# Patient Record
Sex: Female | Born: 1965 | Race: White | Hispanic: No | Smoking: Never smoker
Health system: Southern US, Community
[De-identification: ages and names within clinical notes are randomized; demographics above are authoritative.]

## PROBLEM LIST (undated history)

## (undated) DIAGNOSIS — G5700 Lesion of sciatic nerve, unspecified lower limb: Secondary | ICD-10-CM

## (undated) DIAGNOSIS — Z9884 Bariatric surgery status: Secondary | ICD-10-CM

## (undated) DIAGNOSIS — R569 Unspecified convulsions: Secondary | ICD-10-CM

## (undated) DIAGNOSIS — I951 Orthostatic hypotension: Secondary | ICD-10-CM

## (undated) DIAGNOSIS — D649 Anemia, unspecified: Secondary | ICD-10-CM

## (undated) DIAGNOSIS — M810 Age-related osteoporosis without current pathological fracture: Secondary | ICD-10-CM

## (undated) DIAGNOSIS — E079 Disorder of thyroid, unspecified: Secondary | ICD-10-CM

## (undated) DIAGNOSIS — E162 Hypoglycemia, unspecified: Secondary | ICD-10-CM

## (undated) DIAGNOSIS — I219 Acute myocardial infarction, unspecified: Secondary | ICD-10-CM

## (undated) DIAGNOSIS — Z8679 Personal history of other diseases of the circulatory system: Secondary | ICD-10-CM

## (undated) HISTORY — PX: GASTRIC BYPASS: SHX52

## (undated) HISTORY — PX: ABDOMINAL ADHESION SURGERY: SHX90

## (undated) HISTORY — PX: GASTROSTOMY W/ FEEDING TUBE: SUR642

## (undated) HISTORY — PX: CHOLECYSTECTOMY: SHX55

## (undated) HISTORY — PX: OOPHORECTOMY: SHX86

## (undated) HISTORY — PX: ABDOMINAL HYSTERECTOMY: SHX81

## (undated) HISTORY — DX: Anemia, unspecified: D64.9

## (undated) HISTORY — PX: DILATION AND CURETTAGE OF UTERUS: SHX78

## (undated) HISTORY — PX: TONSILLECTOMY: SUR1361

## (undated) HISTORY — PX: ESOPHAGOGASTRODUODENOSCOPY: SHX1529

## (undated) HISTORY — DX: Unspecified convulsions: R56.9

## (undated) HISTORY — PX: GASTRIC BYPASS OPEN: SUR638

## (undated) HISTORY — PX: HERNIA REPAIR: SHX51

## (undated) HISTORY — PX: SPLENECTOMY: SUR1306

---

## 2011-03-24 DIAGNOSIS — G43909 Migraine, unspecified, not intractable, without status migrainosus: Secondary | ICD-10-CM | POA: Insufficient documentation

## 2011-03-24 DIAGNOSIS — F3162 Bipolar disorder, current episode mixed, moderate: Secondary | ICD-10-CM

## 2011-03-24 DIAGNOSIS — E039 Hypothyroidism, unspecified: Secondary | ICD-10-CM | POA: Insufficient documentation

## 2011-03-24 DIAGNOSIS — Z9884 Bariatric surgery status: Secondary | ICD-10-CM | POA: Insufficient documentation

## 2011-03-24 DIAGNOSIS — F319 Bipolar disorder, unspecified: Secondary | ICD-10-CM | POA: Insufficient documentation

## 2011-03-24 DIAGNOSIS — F411 Generalized anxiety disorder: Secondary | ICD-10-CM | POA: Insufficient documentation

## 2011-03-24 DIAGNOSIS — G479 Sleep disorder, unspecified: Secondary | ICD-10-CM | POA: Insufficient documentation

## 2011-03-24 HISTORY — DX: Migraine, unspecified, not intractable, without status migrainosus: G43.909

## 2011-04-07 ENCOUNTER — Inpatient Hospital Stay: Payer: Self-pay | Admitting: Psychiatry

## 2011-04-07 LAB — DRUG SCREEN, URINE
Barbiturates, Ur Screen: NEGATIVE (ref ?–200)
Cannabinoid 50 Ng, Ur ~~LOC~~: NEGATIVE (ref ?–50)
MDMA (Ecstasy)Ur Screen: NEGATIVE (ref ?–500)
Opiate, Ur Screen: NEGATIVE (ref ?–300)
Phencyclidine (PCP) Ur S: NEGATIVE (ref ?–25)
Tricyclic, Ur Screen: POSITIVE (ref ?–1000)

## 2011-04-07 LAB — URINALYSIS, COMPLETE
Bilirubin,UR: NEGATIVE
Blood: NEGATIVE
Glucose,UR: NEGATIVE mg/dL (ref 0–75)
Ketone: NEGATIVE
Leukocyte Esterase: NEGATIVE
Nitrite: NEGATIVE
Ph: 6 (ref 4.5–8.0)
RBC,UR: <1 /HPF (ref 0–5)

## 2011-04-07 LAB — CBC
HGB: 10.7 g/dL — ABNORMAL LOW (ref 12.0–16.0)
MCHC: 32.3 g/dL (ref 32.0–36.0)
Platelet: 405 10*3/uL (ref 150–440)
RBC: 3.23 10*6/uL — ABNORMAL LOW (ref 3.80–5.20)
RDW: 14.8 % — ABNORMAL HIGH (ref 11.5–14.5)

## 2011-04-07 LAB — COMPREHENSIVE METABOLIC PANEL
Alkaline Phosphatase: 122 U/L (ref 50–136)
Anion Gap: 9 (ref 7–16)
BUN: 8 mg/dL (ref 7–18)
Bilirubin,Total: 0.3 mg/dL (ref 0.2–1.0)
Calcium, Total: 7.5 mg/dL — ABNORMAL LOW (ref 8.5–10.1)
Creatinine: 0.69 mg/dL (ref 0.60–1.30)
EGFR (African American): >60
EGFR (Non-African Amer.): >60
Glucose: 82 mg/dL (ref 65–99)
SGPT (ALT): 14 U/L
Total Protein: 5.5 g/dL — ABNORMAL LOW (ref 6.4–8.2)

## 2011-04-07 LAB — ACETAMINOPHEN LEVEL: Acetaminophen: 5 ug/mL — ABNORMAL LOW

## 2011-04-15 LAB — LITHIUM LEVEL: Lithium: 1.44 mmol/L — ABNORMAL HIGH

## 2011-06-08 ENCOUNTER — Emergency Department: Payer: Self-pay | Admitting: *Deleted

## 2011-06-08 LAB — URINALYSIS, COMPLETE
Bilirubin,UR: NEGATIVE
Blood: NEGATIVE
Glucose,UR: NEGATIVE mg/dL (ref 0–75)
Hyaline Cast: 21
Leukocyte Esterase: NEGATIVE
Nitrite: NEGATIVE
RBC,UR: 1 /HPF (ref 0–5)
Squamous Epithelial: 1

## 2011-06-08 LAB — COMPREHENSIVE METABOLIC PANEL
Albumin: 3.1 g/dL — ABNORMAL LOW (ref 3.4–5.0)
BUN: 10 mg/dL (ref 7–18)
Bilirubin,Total: 0.3 mg/dL (ref 0.2–1.0)
Calcium, Total: 7.6 mg/dL — ABNORMAL LOW (ref 8.5–10.1)
Co2: 25 mmol/L (ref 21–32)
Creatinine: 0.95 mg/dL (ref 0.60–1.30)
EGFR (Non-African Amer.): >60
Glucose: 87 mg/dL (ref 65–99)
Osmolality: 280 (ref 275–301)
SGOT(AST): 24 U/L (ref 15–37)

## 2011-06-08 LAB — DRUG SCREEN, URINE
Amphetamines, Ur Screen: NEGATIVE (ref ?–1000)
Benzodiazepine, Ur Scrn: NEGATIVE (ref ?–200)
Cannabinoid 50 Ng, Ur ~~LOC~~: NEGATIVE (ref ?–50)
Cocaine Metabolite,Ur ~~LOC~~: NEGATIVE (ref ?–300)
Methadone, Ur Screen: NEGATIVE (ref ?–300)
Opiate, Ur Screen: NEGATIVE (ref ?–300)
Phencyclidine (PCP) Ur S: NEGATIVE (ref ?–25)
Tricyclic, Ur Screen: POSITIVE (ref ?–1000)

## 2011-06-08 LAB — CBC
HCT: 38.7 % (ref 35.0–47.0)
MCHC: 32.1 g/dL (ref 32.0–36.0)
Platelet: 451 10*3/uL — ABNORMAL HIGH (ref 150–440)
RDW: 14.4 % (ref 11.5–14.5)
WBC: 13.6 10*3/uL — ABNORMAL HIGH (ref 3.6–11.0)

## 2011-06-08 LAB — LITHIUM LEVEL: Lithium: 1.33 mmol/L — ABNORMAL HIGH

## 2011-06-08 LAB — ETHANOL
Ethanol %: <0.003 % (ref 0.000–0.080)
Ethanol: <3 mg/dL

## 2011-06-12 ENCOUNTER — Ambulatory Visit: Payer: Self-pay | Admitting: Neurology

## 2011-07-10 DIAGNOSIS — G40909 Epilepsy, unspecified, not intractable, without status epilepticus: Secondary | ICD-10-CM | POA: Insufficient documentation

## 2011-08-05 ENCOUNTER — Emergency Department: Payer: Self-pay | Admitting: Emergency Medicine

## 2011-08-10 ENCOUNTER — Emergency Department: Payer: Self-pay | Admitting: Emergency Medicine

## 2011-09-10 ENCOUNTER — Emergency Department: Payer: Self-pay | Admitting: Emergency Medicine

## 2011-09-27 ENCOUNTER — Emergency Department: Payer: Self-pay | Admitting: Emergency Medicine

## 2011-09-27 LAB — URINALYSIS, COMPLETE
Bilirubin,UR: NEGATIVE
Blood: NEGATIVE
Glucose,UR: NEGATIVE mg/dL (ref 0–75)
Ketone: NEGATIVE
Nitrite: NEGATIVE
Protein: NEGATIVE
Specific Gravity: 1.009 (ref 1.003–1.030)
Squamous Epithelial: NONE SEEN
WBC UR: <1 /HPF (ref 0–5)

## 2011-09-27 LAB — ETHANOL
Ethanol %: <0.003 % (ref 0.000–0.080)
Ethanol: <3 mg/dL

## 2011-09-27 LAB — COMPREHENSIVE METABOLIC PANEL
Anion Gap: 5 — ABNORMAL LOW (ref 7–16)
Calcium, Total: 8.1 mg/dL — ABNORMAL LOW (ref 8.5–10.1)
Co2: 30 mmol/L (ref 21–32)
Creatinine: 0.81 mg/dL (ref 0.60–1.30)
EGFR (African American): >60
EGFR (Non-African Amer.): >60
Potassium: 4.6 mmol/L (ref 3.5–5.1)
SGOT(AST): 34 U/L (ref 15–37)
SGPT (ALT): 29 U/L
Sodium: 142 mmol/L (ref 136–145)

## 2011-09-27 LAB — DRUG SCREEN, URINE
Amphetamines, Ur Screen: NEGATIVE (ref ?–1000)
Barbiturates, Ur Screen: NEGATIVE (ref ?–200)
MDMA (Ecstasy)Ur Screen: NEGATIVE (ref ?–500)
Methadone, Ur Screen: NEGATIVE (ref ?–300)
Opiate, Ur Screen: NEGATIVE (ref ?–300)
Phencyclidine (PCP) Ur S: NEGATIVE (ref ?–25)

## 2011-09-27 LAB — CBC
HCT: 40.7 % (ref 35.0–47.0)
HGB: 13 g/dL (ref 12.0–16.0)
MCH: 31.1 pg (ref 26.0–34.0)
MCHC: 31.9 g/dL — ABNORMAL LOW (ref 32.0–36.0)
MCV: 97 fL (ref 80–100)
Platelet: 448 10*3/uL — ABNORMAL HIGH (ref 150–440)
RDW: 16.8 % — ABNORMAL HIGH (ref 11.5–14.5)
WBC: 6.1 10*3/uL (ref 3.6–11.0)

## 2011-09-27 LAB — SALICYLATE LEVEL: Salicylates, Serum: <1.7 mg/dL

## 2011-09-27 LAB — TSH: Thyroid Stimulating Horm: 4.95 u[IU]/mL — ABNORMAL HIGH

## 2011-09-30 ENCOUNTER — Emergency Department: Payer: Self-pay | Admitting: Emergency Medicine

## 2011-09-30 LAB — CBC WITH DIFFERENTIAL/PLATELET
Basophil #: 0.1 10*3/uL (ref 0.0–0.1)
Eosinophil #: 0.1 10*3/uL (ref 0.0–0.7)
Eosinophil %: 1.4 %
HCT: 39.5 % (ref 35.0–47.0)
HGB: 12.2 g/dL (ref 12.0–16.0)
Lymphocyte #: 2.7 10*3/uL (ref 1.0–3.6)
MCH: 30.3 pg (ref 26.0–34.0)
MCHC: 30.9 g/dL — ABNORMAL LOW (ref 32.0–36.0)
MCV: 98 fL (ref 80–100)
Monocyte #: 0.9 x10 3/mm (ref 0.2–0.9)
Monocyte %: 10.4 %
Neutrophil %: 56.6 %
RBC: 4.03 10*6/uL (ref 3.80–5.20)

## 2011-09-30 LAB — BASIC METABOLIC PANEL
Anion Gap: 9 (ref 7–16)
BUN: 15 mg/dL (ref 7–18)
Calcium, Total: 7.7 mg/dL — ABNORMAL LOW (ref 8.5–10.1)
Chloride: 107 mmol/L (ref 98–107)
Co2: 27 mmol/L (ref 21–32)
EGFR (Non-African Amer.): >60
Glucose: 86 mg/dL (ref 65–99)
Osmolality: 285 (ref 275–301)
Potassium: 4.3 mmol/L (ref 3.5–5.1)
Sodium: 143 mmol/L (ref 136–145)

## 2011-09-30 LAB — URINALYSIS, COMPLETE
Bilirubin,UR: NEGATIVE
Ketone: NEGATIVE
Ph: 6 (ref 4.5–8.0)
Protein: NEGATIVE
RBC,UR: <1 /HPF (ref 0–5)
Specific Gravity: 1.018 (ref 1.003–1.030)
Squamous Epithelial: NONE SEEN
WBC UR: <1 /HPF (ref 0–5)

## 2011-10-05 ENCOUNTER — Emergency Department: Payer: Self-pay | Admitting: Emergency Medicine

## 2011-10-05 LAB — COMPREHENSIVE METABOLIC PANEL
Albumin: 3.3 g/dL — ABNORMAL LOW (ref 3.4–5.0)
Alkaline Phosphatase: 187 U/L — ABNORMAL HIGH (ref 50–136)
BUN: 10 mg/dL (ref 7–18)
Calcium, Total: 7.7 mg/dL — ABNORMAL LOW (ref 8.5–10.1)
Chloride: 113 mmol/L — ABNORMAL HIGH (ref 98–107)
Co2: 26 mmol/L (ref 21–32)
Creatinine: 0.94 mg/dL (ref 0.60–1.30)
EGFR (Non-African Amer.): >60
Potassium: 4.5 mmol/L (ref 3.5–5.1)
SGPT (ALT): 20 U/L
Total Protein: 6.2 g/dL — ABNORMAL LOW (ref 6.4–8.2)

## 2011-10-05 LAB — CBC
HCT: 35.1 % (ref 35.0–47.0)
HGB: 11.7 g/dL — ABNORMAL LOW (ref 12.0–16.0)
MCV: 96 fL (ref 80–100)
WBC: 8.3 10*3/uL (ref 3.6–11.0)

## 2011-10-05 LAB — TROPONIN I: Troponin-I: <0.02 ng/mL

## 2011-10-08 ENCOUNTER — Emergency Department: Payer: Self-pay | Admitting: Unknown Physician Specialty

## 2011-10-22 ENCOUNTER — Emergency Department: Payer: Self-pay | Admitting: Emergency Medicine

## 2012-06-06 DIAGNOSIS — R109 Unspecified abdominal pain: Secondary | ICD-10-CM | POA: Diagnosis present

## 2013-02-27 DIAGNOSIS — R9431 Abnormal electrocardiogram [ECG] [EKG]: Secondary | ICD-10-CM

## 2013-02-27 DIAGNOSIS — T50901A Poisoning by unspecified drugs, medicaments and biological substances, accidental (unintentional), initial encounter: Secondary | ICD-10-CM

## 2013-02-27 DIAGNOSIS — R4589 Other symptoms and signs involving emotional state: Secondary | ICD-10-CM | POA: Insufficient documentation

## 2013-02-27 HISTORY — DX: Poisoning by unspecified drugs, medicaments and biological substances, accidental (unintentional), initial encounter: T50.901A

## 2013-02-27 HISTORY — DX: Other symptoms and signs involving emotional state: R45.89

## 2013-02-27 HISTORY — DX: Abnormal electrocardiogram (ECG) (EKG): R94.31

## 2014-02-23 DIAGNOSIS — M545 Low back pain, unspecified: Secondary | ICD-10-CM | POA: Insufficient documentation

## 2014-02-23 DIAGNOSIS — M5416 Radiculopathy, lumbar region: Secondary | ICD-10-CM | POA: Insufficient documentation

## 2014-05-10 DIAGNOSIS — R634 Abnormal weight loss: Secondary | ICD-10-CM

## 2014-05-10 DIAGNOSIS — R7989 Other specified abnormal findings of blood chemistry: Secondary | ICD-10-CM | POA: Insufficient documentation

## 2014-05-10 HISTORY — DX: Abnormal weight loss: R63.4

## 2014-05-10 HISTORY — DX: Other specified abnormal findings of blood chemistry: R79.89

## 2014-07-02 NOTE — H&P (Signed)
PATIENT NAME:  TANIQUA, ISSA MR#:  [id number] DATE OF BIRTH:  28-Feb-1966  DATE OF ADMISSION:  04/07/2011  REFERRING PHYSICIAN: Janalyn Harder, MD  ATTENDING PHYSICIAN: Jolanta B. Jennet Maduro, MD   IDENTIFYING DATA: Ms. Galambos is a 49 year old female with a history of bipolar disorder.   CHIEF COMPLAINT: "My son brought me here."  HISTORY OF PRESENT ILLNESS: Ms. Staggs relocated to [facility] from [place] in the middle of December. She is separated from her husband and moved in with her 34 year old son living in the area. She has had medications prescribed by her psychiatrist in [place] in addition to medication that was prescribed at [facility] by her new provider. She felt that instructions for use of medications were somewhat unclear, and she took a Valium at 6:00 and Ambien at 8:00 at night and was found by her family somnolent with slurred speech and was brought to the Emergency Room. There was a worry that the patient took an overdose of benzodiazepine. The patient adamantly denies. Judging from her list of medications that she brought to the hospital with her, she still has a supply of Valium prescribed in [place], but she did not have any Xanax that were prescribed by a new provider at [facility]. So, it is not impossible that she has been misusing her pills. The patient reports a long history of bipolar illness that had been treated with lithium and Seroquel with excellent results; however, lately Seroquel stopped working. This is mostly evident from insomnia. The patient was taking Ambien and Valium in order to induce sleep. She denies psychotic symptoms, denies symptoms of depression. She does endorse severe anxiety with panic attacks. She denies alcohol or illicit substance use.   PAST PSYCHIATRIC HISTORY: She has been on Seroquel for the past 6 or 8 years with excellent results. She was taking up to 1000 mg a night. She does not have benefit from Seroquel lately. She was switched by  her new provider to a combination of Saphris 20 mg and olanzapine 10 mg. She is unable to tell the difference now. The patient apparently has been compliant with treatment as her lithium level is 1.32 on admission. She reports no suicide attempts. She was able to avoid hospitalization for many years now. She had one suicide gesture at the age of 42 by a little bit of cutting. She has not been cutting since.   FAMILY PSYCHIATRIC HISTORY: She has a sister with bipolar schizophrenia. Mother with depression and PTSD.  Also, her 16 year old son suffers depression and has been away from home in [place] trying to recover from substance abuse.   PAST MEDICAL HISTORY:  1. Hypothyroidism.  2. Anemia.  3. B12 deficiency.   ALLERGIES: No known drug allergies.   MEDICATIONS ON ADMISSION:  1. Zyprexa 10 mg at night.  2. Saphris 20 mg at night.  3. Xanax 2 mg b.i.d.   4. Ambien 10 mg at night as needed for anxiety. 5. Celexa 20 mg daily.  6. Lithium 900 mg daily. 7. Synthroid, unknown dose. 8. Iron pills.  9. B12 monthly shots.   SOCIAL HISTORY: She used to live in [place]. The husband walked out on her in January of last year. She lost her house. She relocated to our area on December 15th. She lives with her son, who is 29 years old, as well as her 47 year old. There is a 37 year old son who is still with the father as he did not want to switch schools. A 47 year old  is in [place] with friends. She is currently unemployed and taking care of the kid. She wants to apply for Disability. She has Medicaid.    REVIEW OF SYSTEMS: CONSTITUTIONAL: No fevers or chills. Positive for pretty dramatic weight loss over the years. She had two gastric bypass surgeries and  lost 140 pounds in all. EYES: No double or blurred vision. ENT: No hearing loss. RESPIRATORY: No shortness of breath or cough. CARDIOVASCULAR: No chest pain or orthopnea. GASTROINTESTINAL: No abdominal pain, nausea, vomiting, or diarrhea. Normal  bowel movements. GU: No incontinence or frequency. ENDOCRINE: No heat or cold intolerance. LYMPHATIC: No anemia or easy bruising. INTEGUMENTARY: No acne or rash. MUSCULOSKELETAL: No muscle or joint pain. NEUROLOGIC: No tingling or weakness. PSYCHIATRIC: See history of present illness for details.   PHYSICAL EXAMINATION:  VITAL SIGNS: Blood pressure 111/65, pulse 93, respirations 20, temperature 96.   GENERAL: This is a slender female in no acute distress.   HEENT: The pupils are equal, round, and reactive to light. Sclerae are anicteric.   NECK: Supple. No thyromegaly.   LUNGS: Clear to auscultation. No dullness to percussion.   HEART: Regular rhythm and rate. No murmurs, rubs, or gallops.   ABDOMEN: Soft, nontender, nondistended. Positive bowel sounds.   MUSCULOSKELETAL: Normal muscle strength in all extremities.   SKIN: No rashes or bruises.   LYMPHATIC: No cervical adenopathy.   NEUROLOGIC: Cranial nerves II through XII are intact.  LABORATORY, DIAGNOSTIC AND RADIOLOGICAL DATA:  Chemistries are within normal limits.  Blood alcohol level is zero.  LFTs are within normal limits.  TSH is 4.9.  Lithium level 1.38.  Urine toxicology screen positive for benzodiazepines and tricyclic antidepressants. CBC: Mild anemia, hemoglobin 10.7, MCV 103.  Urinalysis is not suggestive of urinary tract infection.  Serum acetaminophen and salicylates are low.   MENTAL STATUS EXAMINATION ON ADMISSION: The patient is alert and oriented to person, place, time, and situation, although she presents a slightly different story than obtained from her family. She was slightly oversedated initially, and I was not able to interview her right away. She is pleasant, polite, and cooperative; although she does not believe that she needs to come to the hospital and argues a little bit about that. She maintains good eye contact. Her speech is of normal rhythm, rate, and volume. Mood is fine with full affect.  Thought processing is logical and goal oriented. Thought content: She denies suicidal or homicidal ideation, delusions or paranoia. There are no auditory or visual hallucinations. Her cognition is grossly intact. Her insight and judgment are questionable.   SUICIDE RISK ASSESSMENT: This is a patient with a long history of mood instability, compliant with medication, one remote suicide attempt, who is in a difficult social situation and possibly obtained unclear directions on medication use from her provider. She adamantly denies intentional overdose.   ASSESSMENT:  AXIS I:  1. Bipolar affective disorder, per history.  2. Anxiety disorder, not otherwise specified.   AXIS II: Deferred.   AXIS III: None.   AXIS IV: Mental illness, recent relocation, separation, primary support.   AXIS V: Global Assessment of Functioning score on admission is 25.   PLAN: The patient was admitted to [facility] Medicine Unit for safety, stabilization and medication management. She was initially placed on suicide precautions and was closely monitored for any unsafe behaviors. She underwent full psychiatric and risk assessment. She received pharmacotherapy, individual and group psychotherapy, substance abuse counseling, and support from therapeutic milieu.   1.  Suicidality: The patient adamantly denies.  2. Mood/psychosis: The patient is on an enormous number of medications. I would like to continue her on lithium as she is compliant and it has worked well in the past. I do not think that she needs to be on Zyprexa. She struggled for years with weight gain ,and I do not feel that she needs to be on two antipsychotics necessarily. I probably will continue Saphris as prescribed by her new provider in the community. She certainly does not need to add Seroquel to this mix. We will discontinue.  3. Insomnia: The patient has been using Seroquel for years to stabilize the mood and promote  sleep. It has not been working lately. She reports that she does sleep well with Ambien but was prescribed only 15 tablets a month. We will provide her with a one-month supply of medication to take nightly.  4. Anxiety: She had been maintained on Valium in [place]. Here she was prescribed Xanax 2 mg twice daily. She still has Valium but ran out of Xanax. It is quite possible that she has been misusing it. I will increase the Celexa dose to address depression and anxiety rather than the benzodiazepines.  5. Medical: She will continue all her medications as in the community.  6. Her family wants to talk to us as they are worried about her medication regimen.  7. Disposition: She will be discharged to home.   ____________________________ Braulio ConteJolanta B. Jennet MaduroPucilowska, MD jbp:cbb D: 04/08/2011 15:25:27 ET T: 04/08/2011 15:50:36 ET JOB#: [id number]  cc: Jolanta B. Jennet MaduroPucilowska, MD, <Dictator> Shari ProwsJOLANTA B PUCILOWSKA MD ELECTRONICALLY SIGNED 04/09/2011 8:40

## 2014-09-23 DIAGNOSIS — K56609 Unspecified intestinal obstruction, unspecified as to partial versus complete obstruction: Secondary | ICD-10-CM | POA: Insufficient documentation

## 2014-09-23 HISTORY — DX: Unspecified intestinal obstruction, unspecified as to partial versus complete obstruction: K56.609

## 2014-09-30 DIAGNOSIS — Z8719 Personal history of other diseases of the digestive system: Secondary | ICD-10-CM

## 2014-09-30 HISTORY — DX: Personal history of other diseases of the digestive system: Z87.19

## 2014-12-12 DIAGNOSIS — R1011 Right upper quadrant pain: Secondary | ICD-10-CM

## 2014-12-12 HISTORY — DX: Right upper quadrant pain: R10.11

## 2014-12-29 DIAGNOSIS — L089 Local infection of the skin and subcutaneous tissue, unspecified: Secondary | ICD-10-CM | POA: Insufficient documentation

## 2014-12-29 HISTORY — DX: Local infection of the skin and subcutaneous tissue, unspecified: L08.9

## 2014-12-31 DIAGNOSIS — F1120 Opioid dependence, uncomplicated: Secondary | ICD-10-CM | POA: Insufficient documentation

## 2015-01-13 DIAGNOSIS — K9423 Gastrostomy malfunction: Secondary | ICD-10-CM

## 2015-01-13 HISTORY — DX: Gastrostomy malfunction: K94.23

## 2015-01-14 DIAGNOSIS — R630 Anorexia: Secondary | ICD-10-CM | POA: Insufficient documentation

## 2015-01-14 HISTORY — DX: Anorexia: R63.0

## 2016-07-04 DIAGNOSIS — R2681 Unsteadiness on feet: Secondary | ICD-10-CM | POA: Insufficient documentation

## 2016-09-17 DIAGNOSIS — T7840XA Allergy, unspecified, initial encounter: Secondary | ICD-10-CM

## 2016-09-17 HISTORY — DX: Allergy, unspecified, initial encounter: T78.40XA

## 2016-11-30 DIAGNOSIS — F1910 Other psychoactive substance abuse, uncomplicated: Secondary | ICD-10-CM

## 2016-11-30 HISTORY — DX: Other psychoactive substance abuse, uncomplicated: F19.10

## 2017-02-23 DIAGNOSIS — K912 Postsurgical malabsorption, not elsewhere classified: Secondary | ICD-10-CM | POA: Insufficient documentation

## 2017-04-22 DIAGNOSIS — G459 Transient cerebral ischemic attack, unspecified: Secondary | ICD-10-CM | POA: Insufficient documentation

## 2017-04-22 HISTORY — DX: Transient cerebral ischemic attack, unspecified: G45.9

## 2018-01-15 ENCOUNTER — Emergency Department
Admission: EM | Admit: 2018-01-15 | Discharge: 2018-01-15 | Disposition: A | Discharge status: Home / Self Care | Payer: Medicaid Other | Attending: Emergency Medicine | Admitting: Emergency Medicine

## 2018-01-15 ENCOUNTER — Emergency Department: Payer: Medicaid Other

## 2018-01-15 ENCOUNTER — Other Ambulatory Visit: Payer: Self-pay

## 2018-01-15 DIAGNOSIS — R42 Dizziness and giddiness: Secondary | ICD-10-CM | POA: Insufficient documentation

## 2018-01-15 DIAGNOSIS — I252 Old myocardial infarction: Secondary | ICD-10-CM | POA: Diagnosis not present

## 2018-01-15 DIAGNOSIS — M5432 Sciatica, left side: Secondary | ICD-10-CM

## 2018-01-15 DIAGNOSIS — M545 Low back pain: Secondary | ICD-10-CM | POA: Diagnosis present

## 2018-01-15 HISTORY — DX: Bariatric surgery status: Z98.84

## 2018-01-15 HISTORY — DX: Hypoglycemia, unspecified: E16.2

## 2018-01-15 HISTORY — DX: Lesion of sciatic nerve, unspecified lower limb: G57.00

## 2018-01-15 HISTORY — DX: Orthostatic hypotension: I95.1

## 2018-01-15 HISTORY — DX: Personal history of other diseases of the circulatory system: Z86.79

## 2018-01-15 HISTORY — DX: Disorder of thyroid, unspecified: E07.9

## 2018-01-15 HISTORY — DX: Age-related osteoporosis without current pathological fracture: M81.0

## 2018-01-15 HISTORY — DX: Acute myocardial infarction, unspecified: I21.9

## 2018-01-15 LAB — BASIC METABOLIC PANEL
Anion gap: 7 (ref 5–15)
BUN: 13 mg/dL (ref 6–20)
CALCIUM: 8.3 mg/dL — AB (ref 8.9–10.3)
CO2: 29 mmol/L (ref 22–32)
CREATININE: 0.82 mg/dL (ref 0.44–1.00)
Chloride: 105 mmol/L (ref 98–111)
GFR calc non Af Amer: >60 mL/min (ref >60)
Glucose, Bld: 107 mg/dL — ABNORMAL HIGH (ref 70–99)
Potassium: 4.3 mmol/L (ref 3.5–5.1)
SODIUM: 141 mmol/L (ref 135–145)

## 2018-01-15 LAB — URINALYSIS, COMPLETE (UACMP) WITH MICROSCOPIC
BACTERIA UA: NONE SEEN
BILIRUBIN URINE: NEGATIVE
Glucose, UA: NEGATIVE mg/dL
HGB URINE DIPSTICK: NEGATIVE
Ketones, ur: NEGATIVE mg/dL
LEUKOCYTES UA: NEGATIVE
NITRITE: NEGATIVE
PH: 5 (ref 5.0–8.0)
PROTEIN: NEGATIVE mg/dL
SPECIFIC GRAVITY, URINE: 1.024 (ref 1.005–1.030)

## 2018-01-15 LAB — CBC
HCT: 39.1 % (ref 36.0–46.0)
Hemoglobin: 12.2 g/dL (ref 12.0–15.0)
MCH: 30 pg (ref 26.0–34.0)
MCHC: 31.2 g/dL (ref 30.0–36.0)
MCV: 96.1 fL (ref 80.0–100.0)
NRBC: 0 % (ref 0.0–0.2)
PLATELETS: 542 10*3/uL — AB (ref 150–400)
RBC: 4.07 MIL/uL (ref 3.87–5.11)
RDW: 15.3 % (ref 11.5–15.5)
WBC: 8.2 10*3/uL (ref 4.0–10.5)

## 2018-01-15 MED ORDER — OXYCODONE-ACETAMINOPHEN 5-325 MG PO TABS: 1.0000 | ORAL_TABLET | Freq: Three times a day (TID) | ORAL | 0 refills | Status: DC | PRN

## 2018-01-15 MED ORDER — OXYCODONE-ACETAMINOPHEN 5-325 MG PO TABS
2.0000 | ORAL_TABLET | Freq: Once | ORAL | Status: AC
  Administered 2018-01-15: 2 via ORAL
  Filled 2018-01-15: qty 2

## 2018-01-15 MED ORDER — PREDNISONE 10 MG (21) PO TBPK: ORAL_TABLET | ORAL | 0 refills | Status: DC

## 2018-01-15 MED ORDER — PREDNISONE 20 MG PO TABS
60.0000 mg | ORAL_TABLET | Freq: Once | ORAL | Status: AC
  Administered 2018-01-15: 60 mg via ORAL
  Filled 2018-01-15: qty 3

## 2018-01-15 NOTE — ED Triage Notes (Signed)
Pt c/o lower back pain with a hx of DDD with sciatica, states the pain is so bad that she began having dizzy spells last night that she thought she was going to pass out. States she has been taking IBU 800mg  q6h for pain and a topical cream

## 2018-01-15 NOTE — ED Provider Notes (Signed)
[facility] Emergency Department Provider Note       Time seen: ----------------------------------------- 5:26 PM on 01/15/2018 -----------------------------------------   I have reviewed the triage vital signs and the nursing notes.  HISTORY   Chief Complaint Dizziness and Back Pain    HPI Diana Quinn is a 52 y.o. female with a history of gastric bypass surgery, hypoglycemia, MI, sciatica who presents to the ED for radicular low back pain into the left leg.  She does report a history of this.  She reports the pain is so bad she is having dizzy spells occasionally where she feels like she can a pass out.  She is been taking ibuprofen for pain.  Past Medical History:  Diagnosis Date  . Gastric bypass status for obesity   . H/O unstable angina   . Hypoglycemia   . MI (myocardial infarction) (HCC)   . Orthostatic hypotension   . Osteoporosis   . Sciatic nerve disease   . Thyroid disease     There are no active problems to display for this patient.   Past Surgical History:  Procedure Laterality Date  . ABDOMINAL ADHESION SURGERY    . ABDOMINAL HYSTERECTOMY    . CHOLECYSTECTOMY    . DILATION AND CURETTAGE OF UTERUS    . GASTRIC BYPASS    . GASTRIC BYPASS OPEN     revision  . GASTROSTOMY W/ FEEDING TUBE    . HERNIA REPAIR    . OOPHORECTOMY    . SPLENECTOMY    . TONSILLECTOMY      Allergies Depakote [divalproex sodium] and Keppra [levetiracetam]  Social History Social History   Tobacco Use  . Smoking status: Never Smoker  . Smokeless tobacco: Never Used  Substance Use Topics  . Alcohol use: Not Currently  . Drug use: Not Currently   Review of Systems Constitutional: Negative for fever. Cardiovascular: Negative for chest pain. Respiratory: Negative for shortness of breath. Gastrointestinal: Negative for abdominal pain, vomiting and diarrhea. Genitourinary: Negative for dysuria. Musculoskeletal: Positive for back pain Skin:  Negative for rash. Neurological: Negative for headaches, positive for weakness and dizziness  All systems negative/normal/unremarkable except as stated in the HPI  ____________________________________________   PHYSICAL EXAM:  VITAL SIGNS: ED Triage Vitals  Enc Vitals Group     BP 01/15/18 1358 115/72     Pulse Rate 01/15/18 1358 72     Resp 01/15/18 1358 17     Temp 01/15/18 1358 97.7 F (36.5 C)     Temp Source 01/15/18 1358 Oral     SpO2 01/15/18 1358 100 %     Weight 01/15/18 1359 150 lb (68 kg)     Height 01/15/18 1359 5\' 2"  (1.575 m)     Head Circumference --      Peak Flow --      Pain Score 01/15/18 1359 8     Pain Loc --      Pain Edu? --      Excl. in GC? --    Constitutional: Alert and oriented. Well appearing and in no distress. Eyes: Conjunctivae are normal. Normal extraocular movements. Cardiovascular: Normal rate, regular rhythm. No murmurs, rubs, or gallops. Respiratory: Normal respiratory effort without tachypnea nor retractions. Breath sounds are clear and equal bilaterally. No wheezes/rales/rhonchi. Gastrointestinal: Soft and nontender. Normal bowel sounds Musculoskeletal: Nontender with normal range of motion in extremities. No lower extremity tenderness nor edema.  Nonfocal tenderness in the back, some radicular pain is appreciated in the left leg with straight  leg raise examination Neurologic:  Normal speech and language. No gross focal neurologic deficits are appreciated.  Skin:  Skin is warm, dry and intact. No rash noted. Psychiatric: Mood and affect are normal. Speech and behavior are normal.  ____________________________________________  ED COURSE:  As part of my medical decision making, I reviewed the following data within the electronic MEDICAL RECORD NUMBER History obtained from family if available, nursing notes, old chart and ekg, as well as notes from prior ED visits. Patient presented for sciatica and low back pain, we will assess with labs and  imaging as indicated at this time.   Procedures ____________________________________________   LABS (pertinent positives/negatives)  Labs Reviewed  BASIC METABOLIC PANEL - Abnormal; Notable for the following components:      Result Value   Glucose, Bld 107 (*)    Calcium 8.3 (*)    All other components within normal limits  CBC - Abnormal; Notable for the following components:   Platelets 542 (*)    All other components within normal limits  URINALYSIS, COMPLETE (UACMP) WITH MICROSCOPIC - Abnormal; Notable for the following components:   Color, Urine YELLOW (*)    APPearance CLEAR (*)    All other components within normal limits  CBG MONITORING, ED    RADIOLOGY Images were viewed by me  Her spine x-rays lumbar spine x-rays  ____________________________________________  DIFFERENTIAL DIAGNOSIS   Sciatica, degenerative disc disease, muscle spasm, strain  FINAL ASSESSMENT AND PLAN  Sciatica   Plan: The patient had presented for worsening sciatica. Patient's labs were unremarkable and not indicative of infection. Patient's imaging did not reveal any acute process.  She be discharged with steroids and pain medicine and referred to orthopedics for follow-up.   Ulice Dash, MD   Note: This note was generated in part or whole with voice recognition software. Voice recognition is usually quite accurate but there are transcription errors that can and very often do occur. I apologize for any typographical errors that were not detected and corrected.     Emily Filbert, MD 01/15/18 [phone]

## 2018-01-26 DIAGNOSIS — E538 Deficiency of other specified B group vitamins: Secondary | ICD-10-CM | POA: Insufficient documentation

## 2018-01-26 DIAGNOSIS — R7302 Impaired glucose tolerance (oral): Secondary | ICD-10-CM | POA: Insufficient documentation

## 2018-01-26 DIAGNOSIS — I959 Hypotension, unspecified: Secondary | ICD-10-CM | POA: Insufficient documentation

## 2018-01-26 DIAGNOSIS — I209 Angina pectoris, unspecified: Secondary | ICD-10-CM | POA: Insufficient documentation

## 2018-01-26 DIAGNOSIS — F209 Schizophrenia, unspecified: Secondary | ICD-10-CM | POA: Insufficient documentation

## 2018-02-08 DIAGNOSIS — M47816 Spondylosis without myelopathy or radiculopathy, lumbar region: Secondary | ICD-10-CM | POA: Insufficient documentation

## 2018-02-08 DIAGNOSIS — E559 Vitamin D deficiency, unspecified: Secondary | ICD-10-CM | POA: Insufficient documentation

## 2018-02-08 DIAGNOSIS — E782 Mixed hyperlipidemia: Secondary | ICD-10-CM | POA: Insufficient documentation

## 2018-02-08 DIAGNOSIS — E785 Hyperlipidemia, unspecified: Secondary | ICD-10-CM | POA: Insufficient documentation

## 2018-02-08 DIAGNOSIS — M5136 Other intervertebral disc degeneration, lumbar region: Secondary | ICD-10-CM | POA: Insufficient documentation

## 2018-02-11 ENCOUNTER — Other Ambulatory Visit: Payer: Self-pay | Admitting: Internal Medicine

## 2018-02-11 DIAGNOSIS — I209 Angina pectoris, unspecified: Secondary | ICD-10-CM

## 2018-02-18 ENCOUNTER — Encounter
Admission: RE | Admit: 2018-02-18 | Discharge: 2018-02-18 | Disposition: A | Discharge status: Home / Self Care | Payer: BLUE CROSS/BLUE SHIELD | Source: Ambulatory Visit | Attending: Internal Medicine | Admitting: Internal Medicine

## 2018-02-18 DIAGNOSIS — I209 Angina pectoris, unspecified: Secondary | ICD-10-CM | POA: Insufficient documentation

## 2018-02-18 MED ORDER — TECHNETIUM TC 99M TETROFOSMIN IV KIT
10.8800 | PACK | Freq: Once | INTRAVENOUS | Status: AC | PRN
  Administered 2018-02-18: 10.88 via INTRAVENOUS

## 2018-02-18 MED ORDER — TECHNETIUM TC 99M TETROFOSMIN IV KIT
30.0000 | PACK | Freq: Once | INTRAVENOUS | Status: AC | PRN
  Administered 2018-02-18: 32.13 via INTRAVENOUS

## 2018-02-18 MED ORDER — REGADENOSON 0.4 MG/5ML IV SOLN
0.4000 mg | Freq: Once | INTRAVENOUS | Status: AC
  Administered 2018-02-18: 0.4 mg via INTRAVENOUS
  Filled 2018-02-18: qty 5

## 2018-02-18 MED ORDER — REGADENOSON 0.4 MG/5ML IV SOLN
0.4000 mg | Freq: Once | INTRAVENOUS | Status: DC
  Filled 2018-02-18: qty 5

## 2018-02-19 LAB — NM MYOCAR MULTI W/SPECT W/WALL MOTION / EF
CHL CUP MPHR: 170 {beats}/min
CHL CUP NUCLEAR SDS: 0
CHL CUP NUCLEAR SRS: 0
CHL CUP NUCLEAR SSS: 0
CHL CUP RESTING HR STRESS: 64 {beats}/min
CSEPHR: 64 %
Estimated workload: 1 METS
Exercise duration (min): 1 min
Exercise duration (sec): 0 s
LV sys vol: 25 mL
LVDIAVOL: 97 mL (ref 46–106)
Peak HR: 109 {beats}/min
TID: 0.91

## 2018-03-31 ENCOUNTER — Ambulatory Visit
Admission: RE | Admit: 2018-03-31 | Discharge: 2018-03-31 | Disposition: A | Discharge status: Home / Self Care | Payer: BLUE CROSS/BLUE SHIELD | Source: Ambulatory Visit | Attending: Nurse Practitioner | Admitting: Nurse Practitioner

## 2018-03-31 ENCOUNTER — Ambulatory Visit: Payer: BLUE CROSS/BLUE SHIELD | Admitting: Nurse Practitioner

## 2018-03-31 ENCOUNTER — Other Ambulatory Visit: Payer: Self-pay

## 2018-03-31 ENCOUNTER — Encounter: Payer: Self-pay | Admitting: Nurse Practitioner

## 2018-03-31 VITALS — BP 117/64 | HR 64 | Temp 98.0°F | Resp 16 | Ht 62.0 in | Wt 171.0 lb

## 2018-03-31 DIAGNOSIS — M79604 Pain in right leg: Secondary | ICD-10-CM | POA: Insufficient documentation

## 2018-03-31 DIAGNOSIS — M542 Cervicalgia: Secondary | ICD-10-CM

## 2018-03-31 DIAGNOSIS — M5441 Lumbago with sciatica, right side: Secondary | ICD-10-CM

## 2018-03-31 DIAGNOSIS — Z789 Other specified health status: Secondary | ICD-10-CM | POA: Insufficient documentation

## 2018-03-31 DIAGNOSIS — M533 Sacrococcygeal disorders, not elsewhere classified: Secondary | ICD-10-CM | POA: Insufficient documentation

## 2018-03-31 DIAGNOSIS — M25562 Pain in left knee: Secondary | ICD-10-CM | POA: Insufficient documentation

## 2018-03-31 DIAGNOSIS — Z79899 Other long term (current) drug therapy: Secondary | ICD-10-CM | POA: Insufficient documentation

## 2018-03-31 DIAGNOSIS — G8929 Other chronic pain: Secondary | ICD-10-CM

## 2018-03-31 DIAGNOSIS — M79605 Pain in left leg: Secondary | ICD-10-CM | POA: Diagnosis present

## 2018-03-31 DIAGNOSIS — R51 Headache: Secondary | ICD-10-CM | POA: Insufficient documentation

## 2018-03-31 DIAGNOSIS — R519 Headache, unspecified: Secondary | ICD-10-CM | POA: Insufficient documentation

## 2018-03-31 DIAGNOSIS — M899 Disorder of bone, unspecified: Secondary | ICD-10-CM | POA: Insufficient documentation

## 2018-03-31 DIAGNOSIS — G894 Chronic pain syndrome: Secondary | ICD-10-CM | POA: Insufficient documentation

## 2018-03-31 DIAGNOSIS — M25561 Pain in right knee: Secondary | ICD-10-CM

## 2018-03-31 DIAGNOSIS — M5442 Lumbago with sciatica, left side: Secondary | ICD-10-CM | POA: Insufficient documentation

## 2018-03-31 HISTORY — DX: Headache, unspecified: R51.9

## 2018-03-31 NOTE — Progress Notes (Signed)
Patient's Name: Diana Quinn  MRN: [id number]  Referring Provider: Danelle Berry, NP  DOB: 10-Nov-1965  PCP: Danelle Berry, NP  DOS: 03/31/2018  Note by: Dionisio David NP  Service setting: Ambulatory outpatient  Specialty: Interventional Pain Management  Location: [facility] (AMB) Pain Management Facility    Patient type: New Patient    Primary Reason(s) for Visit: Initial Patient Evaluation CC: Back Pain (lower bilateral); Neck Pain (bulging disc, arthritis, narrowing ); Knee Pain (bilateral ); and Hip Pain (bilateral,  bulging disc thoracic area)  HPI  Diana Quinn is a 53 y.o. year old, female patient, who comes today for an initial evaluation. She has Angina pectoris ([place]); Bariatric surgery status; Bipolar disorder ([place]); Degeneration of lumbar or lumbosacral intervertebral disc; Epilepsy ([place]); Generalized anxiety disorder; Hypotension; Hypothyroidism; Impaired glucose tolerance; Migraine headache; Other and unspecified hyperlipidemia; Other B-complex deficiencies; Schizophrenia ([place]); Sleep disturbances; Spondylosis; Vitamin D deficiency; Chronic bilateral low back pain with bilateral sciatica (Primary Area of Pain) (L>R); Chronic pain of both lower extremities (Secondary Area of Pain) (L>R); Chronic neck pain (Tertiary Area of Pain) (L>R); Occipital headache (Fourth Area of Pain); Pharmacologic therapy; Disorder of skeletal system; Problems influencing health status; Chronic pain of both knees (L>R); and Chronic pain syndrome on their problem list.. Her primarily concern today is the Back Pain (lower bilateral); Neck Pain (bulging disc, arthritis, narrowing ); Knee Pain (bilateral ); and Hip Pain (bilateral,  bulging disc thoracic area)  Pain Assessment: Location: Lower, Left, Right(see visit info for additiional pain sites ) Back Radiating: down both legs,  neck pain causing headaches.  Onset: More than a month ago Duration: Chronic pain Quality: Discomfort, Tender, Tightness, Aching,  Shooting, Sharp, Constant(excruciating ) Severity: 7 /10 (subjective, self-reported pain score)  Note: Reported level is compatible with observation. Clinically the patient looks like a 1/10 A 1/10 is viewed as "Mild" and described as nagging, annoying, but not interfering with basic activities of daily living (ADL). Diana Quinn is able to eat, bathe, get dressed, do toileting (being able to get on and off the toilet and perform personal hygiene functions), transfer (move in and out of bed or a chair without assistance), and maintain continence (able to control bladder and bowel functions). Physiologic parameters such as blood pressure and heart rate apear wnl. Information on the proper use of the pain scale provided to the patient today. When using our objective Pain Scale, levels between 6 and 10/10 are said to belong in an emergency room, as it progressively worsens from a 6/10, described as severely limiting, requiring emergency care not usually available at an outpatient pain management facility. At a 6/10 level, communication becomes difficult and requires great effort. Assistance to reach the emergency department may be required. Facial flushing and profuse sweating along with potentially dangerous increases in heart rate and blood pressure will be evident. Effect on ADL: sleep disruption Timing: Constant Modifying factors: heat, massage.  muscle relaxers.  ibuprofen around the clock  BP: 117/64  HR: 64  Onset and Duration: Gradual, Date of onset: 2018 and Present longer than 3 months Cause of pain: Unknown Severity: Getting worse, NAS-11 at its worse: 9/10, NAS-11 at its best: 6/10, NAS-11 now: 7/10 and NAS-11 on the average: 7/10 Timing: Morning, Night, During activity or exercise, After activity or exercise and After a period of immobility Aggravating Factors: Bending, Climbing, Intercourse (sex), Lifiting, Motion, Prolonged sitting, Prolonged standing, Squatting, Stooping , Twisting, Walking  and Walking uphill Alleviating Factors: Cold packs, Hot packs, Medications and  Warm showers or baths Associated Problems: Night-time cramps, Fatigue, Inability to concentrate, Nausea, Sadness, Tingling, Weakness, Pain that wakes patient up and Pain that does not allow patient to sleep Quality of Pain: Aching, Annoying, Constant, Deep, Disabling, Distressing, Exhausting, Getting longer, Nagging, Pulsating, Shooting, Sickening, Tender, Tingling, Tiring and Uncomfortable Previous Examinations or Tests: MRI scan Previous Treatments: Physical Therapy and Steroid treatments by mouth  The patient comes into the clinics today for the first time for a chronic pain management evaluation.  According to the patient her primary area of pain is in her lower back.  She has been having this pain since 2006.  She feels like it has gotten worse over the last few months.  She feels like the left side is greater than the right.  She was seen in [facility] started on a steroid and tramadol.  She denies any interventional therapy.  She did start physical therapy and completed 4 weeks of therapy but had to stop secondary to pain.  She admits that an MRI was completed at [facility].  She was told there was no need for surgery.  Second area of pain is in her legs.  She admits the left is greater than the right.  The pain goes down the back of her leg to the bottom of her feet.  She has numbness and tingling.  She denies any weakness.  Her third area pain is in her neck.  She admits the left side is greater than the right.  She describes this tightness that goes up into her hand.  She has had occipital nerve block in 2017.  She is feels like it was effective for about 1 week.  Last MRI was in [place] no recent images.  She admits that she does have a history of migraines and is currently on oral medications which is effective.  Last area of pain is in her knees.  She feels his pain has been going on for about 3  months.  She denies any previous treatment or surgery.  Physical therapy as above.  She denies any recent images.  Today I took the time to provide the patient with information regarding this pain practice. The patient was informed that the practice is divided into two sections: an interventional pain management section, as well as a completely separate and distinct medication management section. I explained that there are procedure days for interventional therapies, and evaluation days for follow-ups and medication management. Because of the amount of documentation required during both, they are kept separated. This means that there is the possibility that she may be scheduled for a procedure on one day, and medication management the next. I have also informed her that because of staffing and facility limitations, this practice will no longer take patients for medication management only. To illustrate the reasons for this, I gave the patient the example of surgeons, and how inappropriate it would be to refer a patient to his/her care, just to write for the post-surgical antibiotics on a surgery done by a different surgeon.   Because interventional pain management is part of the board-certified specialty for the doctors, the patient was informed that joining this practice means that they are open to any and all interventional therapies. I made it clear that this does not mean that they will be forced to have any procedures done. What this means is that I believe interventional therapies to be essential part of the diagnosis and proper management of chronic pain  conditions. Therefore, patients not interested in these interventional alternatives will be better served under the care of a different practitioner.  The patient was also made aware of my Comprehensive Pain Management Safety Guidelines where by joining this practice, they limit all of their nerve blocks and joint injections to those done by our  practice, for as long as we are retained to manage their care. Historic Controlled Substance Pharmacotherapy Review  PMP and historical list of controlled substances: PMP limited patient lived in [place] past 7 years clonazepam 0.5 mg, hydrocodone/acetaminophen 5/325 mg, oxycodone/acetaminophen 5/325 mg, tramadol 50 mg, Highest opioid analgesic regimen found: Oxycodone/acetaminophen 5/325 mg 1 tablet 3-4 times daily (fill date 01/15/2017) oxycodone 15 to 20 mg/day Most recent opioid analgesic: Hydrocodone/acetaminophen 5/325 1 tablet 3 times daily (fill date 02/10/2018) hydrocodone 15 mg/day Current opioid analgesics: None Highest recorded MME/day: 25 mg/day MME/day: 0 mg/day Medications: The patient did not bring the medication(s) to the appointment, as requested in our "New Patient Package" Pharmacodynamics: Desired effects: Analgesia: The patient reports >50% benefit. Reported improvement in function: The patient reports medication allows her to accomplish basic ADLs. Clinically meaningful improvement in function (CMIF): Sustained CMIF goals met Perceived effectiveness: Described as relatively effective, allowing for increase in activities of daily living (ADL) Undesirable effects: Side-effects or Adverse reactions: None reported Historical Monitoring: The patient  reports no history of drug use. List of all UDS Test(s): Lab Results  Component Value Date   MDMA NEGATIVE 09/27/2011   MDMA NEGATIVE 06/08/2011   MDMA NEGATIVE 04/07/2011   COCAINSCRNUR NEGATIVE 09/27/2011   COCAINSCRNUR NEGATIVE 06/08/2011   COCAINSCRNUR NEGATIVE 04/07/2011   PCPSCRNUR NEGATIVE 09/27/2011   PCPSCRNUR NEGATIVE 06/08/2011   PCPSCRNUR NEGATIVE 04/07/2011   THCU NEGATIVE 09/27/2011   THCU NEGATIVE 06/08/2011   THCU NEGATIVE 04/07/2011   List of all Serum Drug Screening Test(s):  No results found for: AMPHSCRSER, BARBSCRSER, BENZOSCRSER, COCAINSCRSER, PCPSCRSER, PCPQUANT, THCSCRSER, CANNABQUANT,  OPIATESCRSER, OXYSCRSER, PROPOXSCRSER Historical Background Evaluation: [place] PDMP: Six (6) year initial data search conducted.             [place] Department of public safety, offender search: Editor, commissioning Information) Non-contributory Risk Assessment Profile: Aberrant behavior: None observed or detected today Risk factors for fatal opioid overdose: None identified today Fatal overdose hazard ratio (HR): Calculation deferred Non-fatal overdose hazard ratio (HR): Calculation deferred Risk of opioid abuse or dependence: 0.7-3.0% with doses ? 36 MME/day and 6.1-26% with doses ? 120 MME/day. Substance use disorder (SUD) risk level: Pending results of Medical Psychology Evaluation for SUD Opioid risk tool (ORT) (Total Score): 2  ORT Scoring interpretation table:  Score <3 = Low Risk for SUD  Score between 4-7 = Moderate Risk for SUD  Score >8 = High Risk for Opioid Abuse   PHQ-2 Depression Scale:  Total score:    PHQ-2 Scoring interpretation table: (Score and probability of major depressive disorder)  Score 0 = No depression  Score 1 = 15.4% Probability  Score 2 = 21.1% Probability  Score 3 = 38.4% Probability  Score 4 = 45.5% Probability  Score 5 = 56.4% Probability  Score 6 = 78.6% Probability   PHQ-9 Depression Scale:  Total score:    PHQ-9 Scoring interpretation table:  Score 0-4 = No depression  Score 5-9 = Mild depression  Score 10-14 = Moderate depression  Score 15-19 = Moderately severe depression  Score 20-27 = Severe depression (2.4 times higher risk of SUD and 2.89 times higher risk of overuse)   Pharmacologic Plan: Pending ordered  tests and/or consults  Meds  The patient has a current medication list which includes the following prescription(s): alendronate, aripiprazole er, aspirin, butalbital-apap-caff-cod, calcium carb-cholecalciferol, clonazepam, cyanocobalamin, ergocalciferol, fyavolv, gabapentin, lactulose, levothyroxine, nitroglycerin, ondansetron, oxcarbazepine,  quetiapine, rosuvastatin, sumatriptan, tizanidine, oxycodone-acetaminophen, and prednisone.  Current Outpatient Medications on File Prior to Visit  Medication Sig  . alendronate (FOSAMAX) 70 MG tablet Take 70 mg by mouth once a week.  . ARIPiprazole ER (ABILIFY MAINTENA) 400 MG PRSY prefilled syringe Inject 400 mg into the muscle every 30 (thirty) days.  Marland Kitchen aspirin 81 MG tablet Take 81 mg by mouth daily.  . Butalbital-APAP-Caff-Cod 50-300-40-30 MG CAPS Take 1 capsule by mouth every 6 (six) hours as needed.  . Calcium Carb-Cholecalciferol (CALTRATE 600+D3) 600-800 MG-UNIT TABS Take 1 tablet by mouth 2 (two) times daily.  . clonazePAM (KLONOPIN) 0.5 MG tablet Take 0.5 mg by mouth at bedtime.  . cyanocobalamin (,VITAMIN B-12,) 1000 MCG/ML injection Inject 1,000 mcg into the muscle every 30 (thirty) days.  . ergocalciferol (VITAMIN D2) 1.25 MG (50000 UT) capsule Take 1.25 mcg by mouth every 30 (thirty) days.  . FYAVOLV 0.5-2.5 MG-MCG tablet Take 1 tablet by mouth daily.  Marland Kitchen gabapentin (NEURONTIN) 100 MG capsule Take 300 mg by mouth at bedtime.  Marland Kitchen lactulose (CHRONULAC) 10 GM/15ML solution Take 15 mLs by mouth as needed.  Marland Kitchen levothyroxine (LEVOXYL) 88 MCG tablet Take 88 mcg by mouth daily.  . nitroGLYCERIN (NITROSTAT) 0.4 MG SL tablet Place 0.4 mg under the tongue as needed.  . ondansetron (ZOFRAN) 8 MG tablet Take 8 mg by mouth as needed.  . Oxcarbazepine (TRILEPTAL) 300 MG tablet Take 300 mg by mouth 2 (two) times daily.  . QUEtiapine (SEROQUEL) 50 MG tablet Take 50 mg by mouth at bedtime.  . rosuvastatin (CRESTOR) 5 MG tablet Take 5 mg by mouth daily.  . SUMAtriptan (IMITREX) 100 MG tablet Take 100 mg by mouth as needed.  Marland Kitchen tiZANidine (ZANAFLEX) 2 MG tablet Take 2 mg by mouth at bedtime.  Marland Kitchen oxyCODONE-acetaminophen (PERCOCET) 5-325 MG tablet Take 1 tablet by mouth every 8 (eight) hours as needed. (Patient not taking: Reported on 03/31/2018)  . predniSONE (STERAPRED UNI-PAK 21 TAB) 10 MG (21) TBPK  tablet Dispense taper pack as directed (Patient not taking: Reported on 03/31/2018)   No current facility-administered medications on file prior to visit.    Imaging Review  Lumbosacral Imaging:  Results for orders placed during the hospital encounter of 01/15/18  DG Lumbar Spine 2-3 Views   Narrative CLINICAL DATA:  Mid lower back pain, history of degenerative disc disease changes and sciatica, lower back pain radiating down both legs greater on LEFT  EXAM: LUMBAR SPINE - 2-3 VIEW  COMPARISON:  None  FINDINGS: 5 non-rib-bearing lumbar vertebra.  Vertebral body and disc space heights maintained.  No fracture, subluxation, or bone destruction.  SI joints preserved.  Minimal rotatory dextroconvex scoliosis.  No obvious spondylolysis.  Disc space narrowing and endplate spur formation at T11-T12.  Scattered surgical clips.  IMPRESSION: No acute osseous abnormalities.   Electronically Signed   By: Lavonia Dana M.D.   On: 01/15/2018 17:42   Note: Available results from prior imaging studies were reviewed.        ROS  Cardiovascular History: Chest pain, Heart attack ( Date: 08/2014) and Heart murmur Pulmonary or Respiratory History: Shortness of breath Neurological History: Seizures (Epilepsy) Review of Past Neurological Studies: No results found for this or any previous visit. Psychological-Psychiatric History: Psychiatric disorder Gastrointestinal History: Vomiting  blood (Ulcers), Heartburn due to stomach pushing into lungs (Hiatal hernia) and Reflux or heatburn Genitourinary History: No reported renal or genitourinary signs or symptoms such as difficulty voiding or producing urine, peeing blood, non-functioning kidney, kidney stones, difficulty emptying the bladder, difficulty controlling the flow of urine, or chronic kidney disease Hematological History: Weakness due to low blood hemoglobin or red blood cell count (Anemia) Endocrine History: Slow  thyroid Rheumatologic History: Joint aches and or swelling due to excess weight (Osteoarthritis) Musculoskeletal History: Negative for myasthenia gravis, muscular dystrophy, multiple sclerosis or malignant hyperthermia Work History: Quit going to work on his/her own  Allergies  Diana Quinn is allergic to depakote [divalproex sodium] and keppra [levetiracetam].  Laboratory Chemistry  Inflammation Markers No results found for: CRP, ESRSEDRATE (CRP: Acute Phase) (ESR: Chronic Phase) Renal Function Markers Lab Results  Component Value Date   BUN 13 01/15/2018   CREATININE 0.82 01/15/2018   GFRAA >60 01/15/2018   GFRNONAA >60 01/15/2018   Hepatic Function Markers Lab Results  Component Value Date   AST 33 10/05/2011   ALT 20 10/05/2011   ALBUMIN 3.3 (L) 10/05/2011   ALKPHOS 187 (H) 10/05/2011   Electrolytes Lab Results  Component Value Date   NA 141 01/15/2018   K 4.3 01/15/2018   CL 105 01/15/2018   CALCIUM 8.3 (L) 01/15/2018   Neuropathy Markers No results found for: [id number] Bone Pathology Markers Lab Results  Component Value Date   ALKPHOS 187 (H) 10/05/2011   CALCIUM 8.3 (L) 01/15/2018   Coagulation Parameters Lab Results  Component Value Date   PLT 542 (H) 01/15/2018   Cardiovascular Markers Lab Results  Component Value Date   HGB 12.2 01/15/2018   HCT 39.1 01/15/2018   Note: Lab results reviewed.  [place]  Drug: Diana Quinn  reports no history of drug use. Alcohol:  reports previous alcohol use. Tobacco:  reports that she has never smoked. She has never used smokeless tobacco. Medical:  has a past medical history of Gastric bypass status for obesity, H/O unstable angina, Hypoglycemia, MI (myocardial infarction) ([place]), Orthostatic hypotension, Osteoporosis, Sciatic nerve disease, and Thyroid disease. Family: family history includes Asthma in her sister; COPD in her mother; Cancer in her mother; Epilepsy in her brother; Hypertension in her mother; Psoriasis  in her sister.  Past Surgical History:  Procedure Laterality Date  . ABDOMINAL ADHESION SURGERY    . ABDOMINAL HYSTERECTOMY    . CHOLECYSTECTOMY    . DILATION AND CURETTAGE OF UTERUS    . GASTRIC BYPASS    . GASTRIC BYPASS OPEN     revision  . GASTROSTOMY W/ FEEDING TUBE    . HERNIA REPAIR    . OOPHORECTOMY    . SPLENECTOMY    . TONSILLECTOMY     Active Ambulatory Problems    Diagnosis Date Noted  . Angina pectoris ([place]) 01/26/2018  . Bariatric surgery status 03/24/2011  . Bipolar disorder ([place]) 03/24/2011  . Degeneration of lumbar or lumbosacral intervertebral disc 02/08/2018  . Epilepsy ([place]) 07/10/2011  . Generalized anxiety disorder 03/24/2011  . Hypotension 01/26/2018  . Hypothyroidism 03/24/2011  . Impaired glucose tolerance 01/26/2018  . Migraine headache 03/24/2011  . Other and unspecified hyperlipidemia 02/08/2018  . Other B-complex deficiencies 01/26/2018  . Schizophrenia ([place]) 01/26/2018  . Sleep disturbances 03/24/2011  . Spondylosis 02/08/2018  . Vitamin D deficiency 02/08/2018  . Chronic bilateral low back pain with bilateral sciatica (Primary Area of Pain) (L>R) 03/31/2018  . Chronic pain of both lower extremities (Secondary  Area of Pain) (L>R) 03/31/2018  . Chronic neck pain [facility] Area of Pain) (L>R) 03/31/2018  . Occipital headache (Fourth Area of Pain) 03/31/2018  . Pharmacologic therapy 03/31/2018  . Disorder of skeletal system 03/31/2018  . Problems influencing health status 03/31/2018  . Chronic pain of both knees (L>R) 03/31/2018  . Chronic pain syndrome 03/31/2018   Resolved Ambulatory Problems    Diagnosis Date Noted  . No Resolved Ambulatory Problems   Past Medical History:  Diagnosis Date  . Gastric bypass status for obesity   . H/O unstable angina   . Hypoglycemia   . MI (myocardial infarction) ([place])   . Orthostatic hypotension   . Osteoporosis   . Sciatic nerve disease   . Thyroid disease    Constitutional Exam  General  appearance: Well nourished, well developed, and well hydrated. In no apparent acute distress Vitals:   03/31/18 1008  BP: 117/64  Pulse: 64  Resp: 16  Temp: 98 F (36.7 C)  TempSrc: Oral  SpO2: 100%  Weight: 171 lb (77.6 kg)  Height: 5' 2" (1.575 m)   BMI Assessment: Estimated body mass index is 31.28 kg/m as calculated from the following:   Height as of this encounter: 5' 2" (1.575 m).   Weight as of this encounter: 171 lb (77.6 kg).  BMI interpretation table: BMI level Category Range association with higher incidence of chronic pain  <18 kg/m2 Underweight   18.5-24.9 kg/m2 Ideal body weight   25-29.9 kg/m2 Overweight Increased incidence by 20%  30-34.9 kg/m2 Obese (Class I) Increased incidence by 68%  35-39.9 kg/m2 Severe obesity (Class II) Increased incidence by 136%  >40 kg/m2 Extreme obesity (Class III) Increased incidence by 254%   BMI Readings from Last 4 Encounters:  03/31/18 31.28 kg/m  01/15/18 27.44 kg/m   Wt Readings from Last 4 Encounters:  03/31/18 171 lb (77.6 kg)  01/15/18 150 lb (68 kg)  Psych/Mental status: Alert, oriented x 3 (person, place, & time)       Eyes: PERLA Respiratory: No evidence of acute respiratory distress  Cervical Spine Exam  Inspection: No masses, redness, or swelling Alignment: Symmetrical Functional ROM: Unrestricted ROM      Stability: No instability detected Muscle strength & Tone: Functionally intact Sensory: Unimpaired Palpation: Complains of area being tender to palpation Cervical compression test      negative  Upper Extremity (UE) Exam    Side: Right upper extremity  Side: Left upper extremity  Inspection: No masses, redness, swelling, or asymmetry. No contractures  Inspection: No masses, redness, swelling, or asymmetry. No contractures  Functional ROM: Unrestricted ROM          Functional ROM: Unrestricted ROM          Muscle strength & Tone: Movement possible against some resistance (4/5)  Muscle strength & Tone:  Functionally intact  Sensory: Unimpaired  Sensory: Unimpaired  Palpation: No palpable anomalies              Palpation: No palpable anomalies              Specialized Test(s): Deferred         Specialized Test(s): Deferred          Thoracic Spine Exam  Inspection: No masses, redness, or swelling Alignment: Symmetrical Functional ROM: Unrestricted ROM Stability: No instability detected Sensory: Unimpaired Muscle strength & Tone: No palpable anomalies  Lumbar Spine Exam  Inspection: No masses, redness, or swelling Alignment: Symmetrical Functional ROM: Unrestricted ROM  Stability: No instability detected Muscle strength & Tone: Functionally intact Sensory: Unimpaired Palpation: Complains of area being tender to palpation       Provocative Tests: Lumbar Hyperextension and rotation test: Positive bilaterally for facet joint pain. Patrick's Maneuver: Positive for left-sided S-I arthralgia              Gait & Posture Assessment  Ambulation: Unassisted Gait: Relatively normal for age and body habitus Posture: WNL   Lower Extremity Exam    Side: Right lower extremity  Side: Left lower extremity  Inspection: No masses, redness, swelling, or asymmetry. No contractures  Inspection: No masses, redness, swelling, or asymmetry. No contractures  Functional ROM: Unrestricted ROM          Functional ROM: Unrestricted ROM          Muscle strength & Tone: Functionally intact  Muscle strength & Tone: Functionally intact  Sensory: Unimpaired  Sensory: Unimpaired  Palpation: No palpable anomalies  Palpation: No palpable anomalies   Assessment  Primary Diagnosis & Pertinent Problem List: The primary encounter diagnosis was Chronic bilateral low back pain with bilateral sciatica (Primary Area of Pain) (L>R). Diagnoses of Chronic pain of both lower extremities (Secondary Area of Pain) (L>R), Chronic neck pain (Tertiary Area of Pain) (L>R), Occipital headache (Fourth Area of Pain), Chronic pain of  both knees (L>R), Pharmacologic therapy, Disorder of skeletal system, Problems influencing health status, Chronic pain syndrome, and Chronic sacroiliac joint pain were also pertinent to this visit.  Visit Diagnosis: 1. Chronic bilateral low back pain with bilateral sciatica (Primary Area of Pain) (L>R)   2. Chronic pain of both lower extremities (Secondary Area of Pain) (L>R)   3. Chronic neck pain (Tertiary Area of Pain) (L>R)   4. Occipital headache (Fourth Area of Pain)   5. Chronic pain of both knees (L>R)   6. Pharmacologic therapy   7. Disorder of skeletal system   8. Problems influencing health status   9. Chronic pain syndrome   10. Chronic sacroiliac joint pain    Plan of Care  Initial treatment plan:  Please be advised that as per protocol, today's visit has been an evaluation only. We have not taken over the patient's controlled substance management.  Problem-specific plan: No problem-specific Assessment & Plan notes found for this encounter.  Ordered Lab-work, Procedure(s), Referral(s), & Consult(s): Orders Placed This Encounter  Procedures  . DG Si Joints  . DG Knee 1-2 Views Right  . DG Knee 1-2 Views Left  . Compliance Drug Analysis, Ur  . Comp. Metabolic Panel (12)  . Magnesium  . Vitamin B12  . Sedimentation rate  . 25-Hydroxyvitamin D Lcms D2+D3  . C-reactive protein   Pharmacotherapy: Medications ordered:  No orders of the defined types were placed in this encounter.  Medications administered during this visit: Diana Quinn had no medications administered during this visit.   Pharmacotherapy under consideration:  Opioid Analgesics: The patient was informed that there is no guarantee that she would be a candidate for opioid analgesics. The decision will be made following CDC guidelines. This decision will be based on the results of diagnostic studies, as well as Diana Quinn's risk profile.  Membrane stabilizer: To be determined at a later time Muscle  relaxant: To be determined at a later time NSAID: To be determined at a later time Other analgesic(s): To be determined at a later time   Interventional therapies under consideration: Diana Quinn was informed that there is no guarantee that she would be  a candidate for interventional therapies. The decision will be based on the results of diagnostic studies, as well as Diana Quinn's risk profile.  Possible procedure(s): Diagnostic midline lumbar epidural steroid injection Diagnostic bilateral lumbar facet nerve block Possible bilateral lumbar facet radiofrequency ablation Diagnostic bilateral occipital nerve block Possible bilateral occipital nerve radiofrequency ablation Diagnostic bilateral cervical facet nerve block Possible bilateral lumbar facet radiofrequency ablation Diagnostic left-sided sacroiliac joint injection Possible left-sided sacroiliac joint radiofrequency ablation   Provider-requested follow-up: Return for 2nd Visit, w/ Dr. Dossie Arbour, medical record release.  Future Appointments  Date Time Provider [place]  04/14/2018 10:30 AM Milinda Pointer, MD [facility] None    Primary Care Physician: Danelle Berry, NP Location: [facility] Note by:  Date: 03/31/2018; Time: 2:42 PM  Pain Score Disclaimer: We use the NRS-11 scale. This is a self-reported, subjective measurement of pain severity with only modest accuracy. It is used primarily to identify changes within a particular patient. It must be understood that outpatient pain scales are significantly less accurate that those used for research, where they can be applied under ideal controlled circumstances with minimal exposure to variables. In reality, the score is likely to be a combination of pain intensity and pain affect, where pain affect describes the degree of emotional arousal or changes in action readiness caused by the sensory experience of pain. Factors such as social and work  situation, setting, emotional state, anxiety levels, expectation, and prior pain experience may influence pain perception and show large inter-individual differences that may also be affected by time variables.  Patient instructions provided during this appointment: Patient Instructions   ____________________________________________________________________________________________  Appointment Policy Summary  It is our goal and responsibility to provide the medical community with assistance in the evaluation and management of patients with chronic pain. Unfortunately our resources are limited. Because we do not have an unlimited amount of time, or available appointments, we are required to closely monitor and manage their use. The following rules exist to maximize their use:  Patient's responsibilities: 1. Punctuality:  At what time should I arrive? You should be physically present in our office 30 minutes before your scheduled appointment. Your scheduled appointment is with your assigned healthcare provider. However, it takes 5-10 minutes to be "checked-in", and another 15 minutes for the nurses to do the admission. If you arrive to our office at the time you were given for your appointment, you will end up being at least 20-25 minutes late to your appointment with the provider. 2. Tardiness:  What happens if I arrive only a few minutes after my scheduled appointment time? You will need to reschedule your appointment. The cutoff is your appointment time. This is why it is so important that you arrive at least 30 minutes before that appointment. If you have an appointment scheduled for 10:00 AM and you arrive at 10:01, you will be required to reschedule your appointment.  3. Plan ahead:  Always assume that you will encounter traffic on your way in. Plan for it. If you are dependent on a driver, make sure they understand these rules and the need to arrive early. 4. Other appointments and  responsibilities:  Avoid scheduling any other appointments before or after your pain clinic appointments.  5. Be prepared:  Write down everything that you need to discuss with your healthcare provider and give this information to the admitting nurse. Write down the medications that you will need refilled. Bring your pills and bottles (even the empty ones),  to all of your appointments, except for those where a procedure is scheduled. 6. No children or pets:  Find someone to take care of them. It is not appropriate to bring them in. 7. Scheduling changes:  We request "advanced notification" of any changes or cancellations. 8. Advanced notification:  Defined as a time period of more than 24 hours prior to the originally scheduled appointment. This allows for the appointment to be offered to other patients. 9. Rescheduling:  When a visit is rescheduled, it will require the cancellation of the original appointment. For this reason they both fall within the category of "Cancellations".  10. Cancellations:  They require advanced notification. Any cancellation less than 24 hours before the  appointment will be recorded as a "No Show". 11. No Show:  Defined as an unkept appointment where the patient failed to notify or declare to the practice their intention or inability to keep the appointment.  Corrective process for repeat offenders:  1. Tardiness: Three (3) episodes of rescheduling due to late arrivals will be recorded as one (1) "No Show". 2. Cancellation or reschedule: Three (3) cancellations or rescheduling will be recorded as one (1) "No Show". 3. "No Shows": Three (3) "No Shows" within a 12 month period will result in discharge from the practice. ____________________________________________________________________________________________   ______________________________________________________________________________________________  Specialty Pain Scale  Introduction:  There are  significant differences in how pain is reported. The word pain usually refers to physical pain, but it is also a common synonym of suffering. The medical community uses a scale from 0 (zero) to 10 (ten) to report pain level. Zero (0) is described as "no pain", while ten (10) is described as "the worse pain you can imagine". The problem with this scale is that physical pain is reported along with suffering. Suffering refers to mental pain, or more often yet it refers to any unpleasant feeling, emotion or aversion associated with the perception of harm or threat of harm. It is the psychological component of pain.  Pain Specialists prefer to separate the two components. The pain scale used by this practice is the Verbal Numerical Rating Scale (VNRS-11). This scale is for the physical pain only. DO NOT INCLUDE how your pain psychologically affects you. This scale is for adults 94 years of age and older. It has 11 (eleven) levels. The 1st level is 0/10. This means: "right now, I have no pain". In the context of pain management, it also means: "right now, my physical pain is under control with the current therapy".  General Information:  The scale should reflect your current level of pain. Unless you are specifically asked for the level of your worst pain, or your average pain. If you are asked for one of these two, then it should be understood that it is over the past 24 hours.  Levels 1 (one) through 5 (five) are described below, and can be treated as an outpatient. Ambulatory pain management facilities such as ours are more than adequate to treat these levels. Levels 6 (six) through 10 (ten) are also described below, however, these must be treated as a hospitalized patient. While levels 6 (six) and 7 (seven) may be evaluated at an urgent care facility, levels 8 (eight) through 10 (ten) constitute medical emergencies and as such, they belong in a hospital's emergency department. When having these levels (as  described below), do not come to our office. Our facility is not equipped to manage these levels. Go directly to an urgent care facility or  an emergency department to be evaluated.  Definitions:  Activities of Daily Living (ADL): Activities of daily living (ADL or ADLs) is a term used in healthcare to refer to people's daily self-care activities. Health professionals often use a person's ability or inability to perform ADLs as a measurement of their functional status, particularly in regard to people post injury, with disabilities and the elderly. There are two ADL levels: Basic and Instrumental. Basic Activities of Daily Living (BADL  or BADLs) consist of self-care tasks that include: Bathing and showering; personal hygiene and grooming (including brushing/combing/styling hair); dressing; Toilet hygiene (getting to the toilet, cleaning oneself, and getting back up); eating and self-feeding (not including cooking or chewing and swallowing); functional mobility, often referred to as "transferring", as measured by the ability to walk, get in and out of bed, and get into and out of a chair; the broader definition (moving from one place to another while performing activities) is useful for people with different physical abilities who are still able to get around independently. Basic ADLs include the things many people do when they get up in the morning and get ready to go out of the house: get out of bed, go to the toilet, bathe, dress, groom, and eat. On the average, loss of function typically follows a particular order. Hygiene is the first to go, followed by loss of toilet use and locomotion. The last to go is the ability to eat. When there is only one remaining area in which the person is independent, there is a 62.9% chance that it is eating and only a 3.5% chance that it is hygiene. Instrumental Activities of Daily Living (IADL or IADLs) are not necessary for fundamental functioning, but they let an  individual live independently in a community. IADL consist of tasks that include: cleaning and maintaining the house; home establishment and maintenance; care of others (including selecting and supervising caregivers); care of pets; child rearing; managing money; managing financials (investments, etc.); meal preparation and cleanup; shopping for groceries and necessities; moving within the community; safety procedures and emergency responses; health management and maintenance (taking prescribed medications); and using the telephone or other form of communication.  Instructions:  Most patients tend to report their pain as a combination of two factors, their physical pain and their psychosocial pain. This last one is also known as "suffering" and it is reflection of how physical pain affects you socially and psychologically. From now on, report them separately.  From this point on, when asked to report your pain level, report only your physical pain. Use the following table for reference.  Pain Clinic Pain Levels (0-5/10)  Pain Level Score  Description  No Pain 0   Mild pain 1 Nagging, annoying, but does not interfere with basic activities of daily living (ADL). Patients are able to eat, bathe, get dressed, toileting (being able to get on and off the toilet and perform personal hygiene functions), transfer (move in and out of bed or a chair without assistance), and maintain continence (able to control bladder and bowel functions). Blood pressure and heart rate are unaffected. A normal heart rate for a healthy adult ranges from 60 to 100 bpm (beats per minute).   Mild to moderate pain 2 Noticeable and distracting. Impossible to hide from other people. More frequent flare-ups. Still possible to adapt and function close to normal. It can be very annoying and may have occasional stronger flare-ups. With discipline, patients may get used to it and adapt.   Moderate  pain 3 Interferes significantly with  activities of daily living (ADL). It becomes difficult to feed, bathe, get dressed, get on and off the toilet or to perform personal hygiene functions. Difficult to get in and out of bed or a chair without assistance. Very distracting. With effort, it can be ignored when deeply involved in activities.   Moderately severe pain 4 Impossible to ignore for more than a few minutes. With effort, patients may still be able to manage work or participate in some social activities. Very difficult to concentrate. Signs of autonomic nervous system discharge are evident: dilated pupils (mydriasis); mild sweating (diaphoresis); sleep interference. Heart rate becomes elevated (>115 bpm). Diastolic blood pressure (lower number) rises above 100 mmHg. Patients find relief in laying down and not moving.   Severe pain 5 Intense and extremely unpleasant. Associated with frowning face and frequent crying. Pain overwhelms the senses.  Ability to do any activity or maintain social relationships becomes significantly limited. Conversation becomes difficult. Pacing back and forth is common, as getting into a comfortable position is nearly impossible. Pain wakes you up from deep sleep. Physical signs will be obvious: pupillary dilation; increased sweating; goosebumps; brisk reflexes; cold, clammy hands and feet; nausea, vomiting or dry heaves; loss of appetite; significant sleep disturbance with inability to fall asleep or to remain asleep. When persistent, significant weight loss is observed due to the complete loss of appetite and sleep deprivation.  Blood pressure and heart rate becomes significantly elevated. Caution: If elevated blood pressure triggers a pounding headache associated with blurred vision, then the patient should immediately seek attention at an urgent or emergency care unit, as these may be signs of an impending stroke.    Emergency Department Pain Levels (6-10/10)  Emergency Room Pain 6 Severely limiting.  Requires emergency care and should not be seen or managed at an outpatient pain management facility. Communication becomes difficult and requires great effort. Assistance to reach the emergency department may be required. Facial flushing and profuse sweating along with potentially dangerous increases in heart rate and blood pressure will be evident.   Distressing pain 7 Self-care is very difficult. Assistance is required to transport, or use restroom. Assistance to reach the emergency department will be required. Tasks requiring coordination, such as bathing and getting dressed become very difficult.   Disabling pain 8 Self-care is no longer possible. At this level, pain is disabling. The individual is unable to do even the most "basic" activities such as walking, eating, bathing, dressing, transferring to a bed, or toileting. Fine motor skills are lost. It is difficult to think clearly.   Incapacitating pain 9 Pain becomes incapacitating. Thought processing is no longer possible. Difficult to remember your own name. Control of movement and coordination are lost.   The worst pain imaginable 10 At this level, most patients pass out from pain. When this level is reached, collapse of the autonomic nervous system occurs, leading to a sudden drop in blood pressure and heart rate. This in turn results in a temporary and dramatic drop in blood flow to the brain, leading to a loss of consciousness. Fainting is one of the body's self defense mechanisms. Passing out puts the brain in a calmed state and causes it to shut down for a while, in order to begin the healing process.    Summary: 1. Refer to this scale when providing [place] with your pain level. 2. Be accurate and careful when reporting your pain level. This will help with your care. 3. Over-reporting  your pain level will lead to loss of credibility. 4. Even a level of 1/10 means that there is pain and will be treated at our facility. 5. High, inaccurate  reporting will be documented as "Symptom Exaggeration", leading to loss of credibility and suspicions of possible secondary gains such as obtaining more narcotics, or wanting to appear disabled, for fraudulent reasons. 6. Only pain levels of 5 or below will be seen at our facility. 7. Pain levels of 6 and above will be sent to the Emergency Department and the appointment cancelled. ______________________________________________________________________________________________

## 2018-03-31 NOTE — Progress Notes (Signed)
Safety precautions to be maintained throughout the outpatient stay will include: orient to surroundings, keep bed in low position, maintain call bell within reach at all times, provide assistance with transfer out of bed and ambulation.  

## 2018-03-31 NOTE — Patient Instructions (Signed)

## 2018-04-01 NOTE — Progress Notes (Signed)
Results were reviewed and found to be: mildly abnormal  No acute injury or pathology identified  Review would suggest interventional pain management techniques may be of benefit 

## 2018-04-03 LAB — COMP. METABOLIC PANEL (12)
ALBUMIN: 4.2 g/dL (ref 3.8–4.8)
ALK PHOS: 212 IU/L — AB (ref 39–117)
AST: 26 IU/L (ref 0–40)
Albumin/Globulin Ratio: 1.6 (ref 1.2–2.2)
BUN/Creatinine Ratio: 9 (ref 9–23)
BUN: 7 mg/dL (ref 6–24)
Bilirubin Total: <0.2 mg/dL (ref 0.0–1.2)
CHLORIDE: 104 mmol/L (ref 96–106)
Calcium: 9 mg/dL (ref 8.7–10.2)
Creatinine, Ser: 0.77 mg/dL (ref 0.57–1.00)
GFR calc Af Amer: 104 mL/min/{1.73_m2} (ref >59)
GFR, EST NON AFRICAN AMERICAN: 90 mL/min/{1.73_m2} (ref >59)
GLOBULIN, TOTAL: 2.7 g/dL (ref 1.5–4.5)
Glucose: 94 mg/dL (ref 65–99)
Potassium: 4.6 mmol/L (ref 3.5–5.2)
Sodium: 140 mmol/L (ref 134–144)
TOTAL PROTEIN: 6.9 g/dL (ref 6.0–8.5)

## 2018-04-03 LAB — 25-HYDROXY VITAMIN D LCMS D2+D3
25-Hydroxy, Vitamin D-2: 25 ng/mL
25-Hydroxy, Vitamin D: 30 ng/mL

## 2018-04-03 LAB — MAGNESIUM: MAGNESIUM: 2.3 mg/dL (ref 1.6–2.3)

## 2018-04-03 LAB — 25-HYDROXYVITAMIN D LCMS D2+D3: 25-HYDROXY, VITAMIN D-3: 4.8 ng/mL

## 2018-04-03 LAB — VITAMIN B12: VITAMIN B 12: 719 pg/mL (ref 232–1245)

## 2018-04-03 LAB — C-REACTIVE PROTEIN: CRP: <1 mg/L (ref 0–10)

## 2018-04-03 LAB — SEDIMENTATION RATE: SED RATE: 21 mm/h (ref 0–40)

## 2018-04-06 LAB — COMPLIANCE DRUG ANALYSIS, UR

## 2018-04-13 DIAGNOSIS — M170 Bilateral primary osteoarthritis of knee: Secondary | ICD-10-CM | POA: Insufficient documentation

## 2018-04-13 DIAGNOSIS — M1711 Unilateral primary osteoarthritis, right knee: Secondary | ICD-10-CM | POA: Insufficient documentation

## 2018-04-13 NOTE — Progress Notes (Signed)
Patient's Name: Diana Quinn  MRN: [id number]  Referring Provider: Danelle Berry, NP  DOB: 09-23-1965  PCP: Danelle Berry, NP  DOS: 04/14/2018  Note by: Gaspar Cola, MD  Service setting: Ambulatory outpatient  Specialty: Interventional Pain Management  Location: [facility] (AMB) Pain Management Facility    Patient type: Established   Primary Reason(s) for Visit: Encounter for evaluation before starting new chronic pain management plan of care (Level of risk: moderate) CC: Back Pain  HPI  Diana Quinn is a 53 y.o. year old, female patient, who comes today for a follow-up evaluation to review the test results and decide on a treatment plan. She has Angina pectoris ([place]); Bariatric surgery status; Bipolar disorder ([place]); DDD (degenerative disc disease), lumbar; Epilepsy ([place]); Generalized anxiety disorder; Hypotension; Hypothyroidism; Impaired glucose tolerance; Migraine headache; Other and unspecified hyperlipidemia; Other B-complex deficiencies; Schizophrenia ([place]); Sleep disturbances; Lumbar spondylosis; Vitamin D deficiency; Chronic low back pain (Primary Area of Pain) (Bilateral) (L>R) w/ sciatica; Chronic lower extremity pain (Secondary Area of Pain) (Bilateral) (L>R); Chronic neck pain (Tertiary Area of Pain) (Bilateral) (L>R); Occipital headache (Fourth Area of Pain) (Bilateral) (L>R); Pharmacologic therapy; Disorder of skeletal system; Problems influencing health status; Chronic knee pain (Bilateral) (L>R); Chronic pain syndrome; Osteoarthritis of knees (Bilateral); Tricompartment osteoarthritis of knee (Right); Chronic hip pain (Bilateral); Chronic sacroiliac joint pain (Bilateral); Lumbar facet syndrome (Bilateral) (L>R); Neurogenic pain; Chronic musculoskeletal pain; and Chronic lumbar radiculitis (S1) (Bilateral) on their problem list. Her primarily concern today is the Back Pain  Pain Assessment: Location: Lower Back Radiating: pain radiaties down leg, and pain radiaties up her head  cause headache Onset: More than a month ago Duration: Chronic pain Quality: Aching, Sharp, Constant, Nagging, Throbbing Severity: 7 /10 (subjective, self-reported pain score)  Note: Reported level is inconsistent with clinical observations. Clinically the patient looks like a 2/10 A 2/10 is viewed as "Mild to Moderate" and described as noticeable and distracting. Impossible to hide from other people. More frequent flare-ups. Still possible to adapt and function close to normal. It can be very annoying and may have occasional stronger flare-ups. With discipline, patients may get used to it and adapt. Information on the proper use of the pain scale provided to the patient today. When using our objective Pain Scale, levels between 6 and 10/10 are said to belong in an emergency room, as it progressively worsens from a 6/10, described as severely limiting, requiring emergency care not usually available at an outpatient pain management facility. At a 6/10 level, communication becomes difficult and requires great effort. Assistance to reach the emergency department may be required. Facial flushing and profuse sweating along with potentially dangerous increases in heart rate and blood pressure will be evident. Effect on ADL: unable to sleep due to the pain, not going out of the house because of this pain Timing: Constant Modifying factors: ibuprofen, ice and heat BP: (!) 109/58  HR: 77  Diana Quinn comes in today for a follow-up visit after her initial evaluation on 03/31/2018. Today we went over the results of her tests. These were explained in "Layman's terms". During today's appointment we went over my diagnostic impression, as well as the proposed treatment plan.  According to the patient her primary area of pain is in her lower back (Bilateral) (L>R).  She has been having this pain since 2006.  She feels like it has gotten worse over the last few months.  She feels like the left side is greater than the  right.  She  was seen in [facility] started on a steroid and tramadol.  She denies any interventional therapy.  She did start physical therapy and completed 4 weeks of therapy but had to stop secondary to pain.  She admits that an MRI was completed at [facility].  She was told there was no need for surgery.  Second area of pain is in her legs (Bilateral) (L>R).  She admits the left is greater than the right.  The pain goes down the back of her leg to the bottom of her feet (S1).  She has numbness and tingling.  She denies any weakness. Weakness on toe walking (S1), bilaterally.  Her third area pain is in her neck (Bilateral) (L>R).  She admits the left side is greater than the right.  She describes this tightness that goes up into her hand.  She has had occipital nerve block in 2017.  She is feels like it was effective for about 1 week.  Last MRI was in [place] no recent images. Had blocks in [place]. Lamar Sprinkles, [facility], [place]. [phone])  (B) (R>L) Knee pain, but left feels more unstable. (R) Anterior pain. (L) Anterior pain.  She admits that she does have a history of migraines and is currently on oral medications which is effective.  Last area of pain is in her knees.  She feels his pain has been going on for about 3 months.  She denies any previous treatment or surgery.  Physical therapy as above.  She denies any recent images.  In considering the treatment plan options, Diana Quinn was reminded that I no longer take patients for medication management only. I asked her to let me know if she had no intention of taking advantage of the interventional therapies, so that we could make arrangements to provide this space to someone interested. I also made it clear that undergoing interventional therapies for the purpose of getting pain medications is very inappropriate on the part of a patient, and it will not be tolerated in this practice. This type of behavior would suggest  true addiction and therefore it requires referral to an addiction specialist.   Further details on both, my assessment(s), as well as the proposed treatment plan, please see below.  Controlled Substance Pharmacotherapy Assessment REMS (Risk Evaluation and Mitigation Strategy)  Analgesic: None Highest recorded MME/day: 25 mg/day MME/day: 0 mg/day  Pill Count: None expected due to no prior prescriptions written by our practice. No notes on file Pharmacokinetics: Liberation and absorption (onset of action): WNL Distribution (time to peak effect): WNL Metabolism and excretion (duration of action): WNL         Pharmacodynamics: Desired effects: Analgesia: Ms. Hakanson reports >50% benefit. Functional ability: Patient reports that medication allows her to accomplish basic ADLs Clinically meaningful improvement in function (CMIF): Sustained CMIF goals met Perceived effectiveness: Described as relatively effective, allowing for increase in activities of daily living (ADL) Undesirable effects: Side-effects or Adverse reactions: None reported Monitoring: [place] PMP: Online review of the past 57-monthperiod previously conducted. Not applicable at this point since we have not taken over the patient's medication management yet. List of other Serum/Urine Drug Screening Test(s):  Lab Results  Component Value Date   COCAINSCRNUR NEGATIVE 09/27/2011   COCAINSCRNUR NEGATIVE 06/08/2011   COCAINSCRNUR NEGATIVE 04/07/2011   THCU NEGATIVE 09/27/2011   THCU NEGATIVE 06/08/2011   THCU NEGATIVE 04/07/2011   List of all UDS test(s) done:  Lab Results  Component Value Date  SUMMARY FINAL 03/31/2018   Last UDS on record: Summary  Date Value Ref Range Status  03/31/2018 FINAL  Final    Comment:    ==================================================================== TOXASSURE COMP DRUG ANALYSIS,UR ==================================================================== Test                              Result       Flag       Units Drug Present and Declared for Prescription Verification   7-aminoclonazepam              84           EXPECTED   ng/mg creat    7-aminoclonazepam is an expected metabolite of clonazepam. Source    of clonazepam is a scheduled prescription medication.   Butalbital                     PRESENT      EXPECTED   Gabapentin                     PRESENT      EXPECTED   Oxcarbazepine MHD              PRESENT      EXPECTED    Oxcarbazepine MHD is the active metabolite of oxcarbazepine and    eslicarbazepine.   Quetiapine                     PRESENT      EXPECTED   Aripiprazole                   PRESENT      EXPECTED   Acetaminophen                  PRESENT      EXPECTED Drug Present not Declared for Prescription Verification   Baclofen                       PRESENT      UNEXPECTED   Ibuprofen                      PRESENT      UNEXPECTED Drug Absent but Declared for Prescription Verification   Oxycodone                      Not Detected UNEXPECTED ng/mg creat   Tizanidine                     Not Detected UNEXPECTED    Tizanidine, as indicated in the declared medication list, is not    always detected even when used as directed.   Salicylate                     Not Detected UNEXPECTED    Aspirin, as indicated in the declared medication list, is not    always detected even when used as directed. ==================================================================== Test                      Result    Flag   Units      Ref Range   Creatinine              102              mg/dL      >=  20 ==================================================================== Declared Medications:  The flagging and interpretation on this report are based on the  following declared medications.  Unexpected results may arise from  inaccuracies in the declared medications.  **Note: The testing scope of this panel includes these medications:  Aripiprazole (Abilify)  Butalbital  (Butalbital/APAP/Caffeine)  Clonazepam (Klonopin)  Gabapentin (Neurontin)  Oxcarbazepine (Trileptal)  Oxycodone (Percocet)  Quetiapine (Seroquel)  **Note: The testing scope of this panel does not include small to  moderate amounts of these reported medications:  Acetaminophen (Butalbital/APAP/Caffeine)  Acetaminophen (Percocet)  Aspirin  Tizanidine (Zanaflex)  **Note: The testing scope of this panel does not include following  reported medications:  Alendronate (Fosamax)  Caffeine (Butalbital/APAP/Caffeine)  Calcium (Calcium/Cholecalciferol)  Cholecalciferol (Calcium/Cholecalciferol)  Lactulose (Constulose)  Levothyroxine  Nitroglycerin (Nitrostat)  Ondansetron (Zofran)  Prednisone (Sterapred)  Rosuvastatin (Crestor)  Sumatriptan (Imitrex)  Vitamin B12  Vitamin D2 ==================================================================== For clinical consultation, please call [phone]. ====================================================================    UDS interpretation: Unexpected findings not considered significantly abnormal. Patient informed of the CDC guidelines and recommendations to stay away from the concomitant use of benzodiazepines and opioids due to the increased risk of respiratory depression and death. Medication Assessment Form: Patient introduced to form today Treatment compliance: Treatment may start today if patient agrees with proposed plan. Evaluation of compliance is not applicable at this point Risk Assessment Profile: Aberrant behavior: See initial evaluations. None observed or detected today Comorbid factors increasing risk of overdose: See initial evaluation. No additional risks detected today Opioid risk tool (ORT) (Total Score): 2 Personal History of Substance Abuse (SUD-Substance use disorder):  Alcohol: Negative  Illegal Drugs: Negative  Rx Drugs: Negative  ORT Risk Level calculation: Low Risk Risk of substance use disorder (SUD):  Low Opioid Risk Tool - 04/14/18 1027      Family History of Substance Abuse   Alcohol  Negative    Illegal Drugs  Negative    Rx Drugs  Negative      Personal History of Substance Abuse   Alcohol  Negative    Illegal Drugs  Negative    Rx Drugs  Negative      Age   Age between 30-45 years   No      History of Preadolescent Sexual Abuse   History of Preadolescent Sexual Abuse  Negative or Female      Psychological Disease   Psychological Disease  Positive    ADD  Negative    OCD  Negative    Bipolar  Positive    Schizophrenia  Negative    Depression  Negative      Total Score   Opioid Risk Tool Scoring  2    Opioid Risk Interpretation  Low Risk      ORT Scoring interpretation table:  Score <3 = Low Risk for SUD  Score between 4-7 = Moderate Risk for SUD  Score >8 = High Risk for Opioid Abuse   Risk Mitigation Strategies:  Patient opioid safety counseling: Completed today. Counseling provided to patient as per "Patient Counseling Document". Document signed by patient, attesting to counseling and understanding Patient-Prescriber Agreement (PPA): Obtained today.  Controlled substance notification to other providers: Written and sent today.  Pharmacologic Plan: Today we may be taking over the patient's pharmacological regimen. See below.             Laboratory Chemistry  Inflammation Markers (CRP: Acute Phase) (ESR: Chronic Phase) Lab Results  Component Value Date   CRP <1 03/31/2018   ESRSEDRATE 21 03/31/2018  Rheumatology Markers No results found.  Renal Function Markers Lab Results  Component Value Date   BUN 7 03/31/2018   CREATININE 0.77 03/31/2018   BCR 9 03/31/2018   GFRAA 104 03/31/2018   GFRNONAA 90 03/31/2018                             Hepatic Function Markers Lab Results  Component Value Date   AST 26 03/31/2018   ALT 20 10/05/2011   ALBUMIN 4.2 03/31/2018   ALKPHOS 212 (H) 03/31/2018                         Electrolytes Lab Results  Component Value Date   NA 140 03/31/2018   K 4.6 03/31/2018   CL 104 03/31/2018   CALCIUM 9.0 03/31/2018   MG 2.3 03/31/2018                        Neuropathy Markers Lab Results  Component Value Date   VITAMINB12 719 03/31/2018                        CNS Tests No results found.  Bone Pathology Markers Lab Results  Component Value Date   25OHVITD1 30 03/31/2018   25OHVITD2 25 03/31/2018   25OHVITD3 4.8 03/31/2018                         Coagulation Parameters Lab Results  Component Value Date   PLT 542 (H) 01/15/2018                        Cardiovascular Markers Lab Results  Component Value Date   CKTOTAL 197 10/05/2011   CKMB 1.2 10/05/2011   TROPONINI < 0.02 10/05/2011   HGB 12.2 01/15/2018   HCT 39.1 01/15/2018                         CA Markers No results found.  Endocrine Markers Lab Results  Component Value Date   TSH 4.95 (H) 09/27/2011                        Note: Lab results reviewed.  Recent Diagnostic Imaging Review  Lumbosacral Imaging: Lumbar DG 2-3 views:  Results for orders placed during the hospital encounter of 01/15/18  DG Lumbar Spine 2-3 Views   Narrative CLINICAL DATA:  Mid lower back pain, history of degenerative disc disease changes and sciatica, lower back pain radiating down both legs greater on LEFT  EXAM: LUMBAR SPINE - 2-3 VIEW  COMPARISON:  None  FINDINGS: 5 non-rib-bearing lumbar vertebra.  Vertebral body and disc space heights maintained.  No fracture, subluxation, or bone destruction.  SI joints preserved.  Minimal rotatory dextroconvex scoliosis.  No obvious spondylolysis.  Disc space narrowing and endplate spur formation at T11-T12.  Scattered surgical clips.  IMPRESSION: No acute osseous abnormalities.   Electronically Signed   By: Lavonia Dana M.D.   On: 01/15/2018 17:42    Sacroiliac Joint Imaging: Sacroiliac Joint DG:  Results for orders placed during the  hospital encounter of 03/31/18  DG Si Joints   Narrative CLINICAL DATA:  BILATERAL posterior SI joint pain  EXAM: BILATERAL SACROILIAC JOINTS - 3+ VIEW  COMPARISON:  None  FINDINGS: Bones demineralized.  SI joints symmetric and preserved.  Sacral foramina grossly unremarkable.  No evidence of sacroiliitis, sacral fracture or bone destruction.  Visualized hip joints preserved.  IMPRESSION: Osseous demineralization without focal SI joint abnormality.   Electronically Signed   By: Lavonia Dana M.D.   On: 03/31/2018 17:09    Knee Imaging: Knee-R DG 1-2 views:  Results for orders placed during the hospital encounter of 03/31/18  DG Knee 1-2 Views Right   Narrative CLINICAL DATA:  Bilateral knee pain with popping sensation for 5 months. No known injury.  EXAM: RIGHT KNEE - 1-2 VIEW  COMPARISON:  None.  FINDINGS: The bones appear mildly demineralized. No evidence of acute fracture or dislocation. There are minimal tricompartmental degenerative changes, and no significant joint effusion. Ill-defined sclerotic lesion in the fibular neck appears nonaggressive, possibly due to old fracture or an incidental chondroid lesion.  IMPRESSION: No acute findings.  Minimal tricompartmental degenerative changes.   Electronically Signed   By: Richardean Sale M.D.   On: 03/31/2018 19:36    Knee-L DG 1-2 views:  Results for orders placed during the hospital encounter of 03/31/18  DG Knee 1-2 Views Left   Narrative CLINICAL DATA:  BILATERAL knee pain with popping sensation for 5 months, no known injury  EXAM: LEFT KNEE - 1-2 VIEW  COMPARISON:  None  FINDINGS: Osseous demineralization.  Medial compartment joint space narrowing.  No acute fracture, dislocation, or bone destruction.  No knee joint effusion.  Probable old healed fracture of the proximal fibular diaphysis.  IMPRESSION: Osseous demineralization with mild degenerative changes at LEFT knee.  Probable  old healed fracture of the proximal LEFT fibular diaphysis.  No acute abnormalities.   Electronically Signed   By: Lavonia Dana M.D.   On: 03/31/2018 17:10    Complexity Note: Imaging results reviewed. Results shared with Ms. Damita Dunnings, using Layman's terms.                         Meds   Current Outpatient Medications:  .  alendronate (FOSAMAX) 70 MG tablet, Take 70 mg by mouth once a week., Disp: , Rfl:  .  ARIPiprazole ER (ABILIFY MAINTENA) 400 MG PRSY prefilled syringe, Inject 400 mg into the muscle every 30 (thirty) days., Disp: , Rfl:  .  butalbital-aspirin-caffeine (FIORINAL) 50-325-40 MG capsule, Take 1 capsule by mouth every 6 (six) hours as needed for headache., Disp: , Rfl:  .  Calcium Carb-Cholecalciferol (CALTRATE 600+D3) 600-800 MG-UNIT TABS, Take 1 tablet by mouth 2 (two) times daily., Disp: , Rfl:  .  cyanocobalamin (,VITAMIN B-12,) 1000 MCG/ML injection, Inject 1,000 mcg into the muscle every 30 (thirty) days., Disp: , Rfl:  .  ergocalciferol (VITAMIN D2) 1.25 MG (50000 UT) capsule, Take 1.25 mcg by mouth every 30 (thirty) days., Disp: , Rfl:  .  FYAVOLV 0.5-2.5 MG-MCG tablet, Take 1 tablet by mouth daily., Disp: , Rfl:  .  gabapentin (NEURONTIN) 100 MG capsule, Take 1-3 capsules (100-300 mg total) by mouth 4 (four) times daily for 30 days. Follow written titration schedule., Disp: 360 capsule, Rfl: 0 .  lactulose (CHRONULAC) 10 GM/15ML solution, Take 15 mLs by mouth as needed., Disp: , Rfl:  .  levothyroxine (LEVOXYL) 88 MCG tablet, Take 88 mcg by mouth daily., Disp: , Rfl:  .  nitroGLYCERIN (NITROSTAT) 0.4 MG SL tablet, Place 0.4 mg under the tongue as needed., Disp: , Rfl:  .  ondansetron (ZOFRAN) 8 MG tablet, Take 8 mg by mouth  as needed., Disp: , Rfl:  .  Oxcarbazepine (TRILEPTAL) 300 MG tablet, Take 300 mg by mouth 2 (two) times daily., Disp: , Rfl:  .  QUEtiapine (SEROQUEL) 50 MG tablet, Take 50 mg by mouth at bedtime., Disp: , Rfl:  .  rosuvastatin (CRESTOR) 5 MG  tablet, Take 5 mg by mouth daily., Disp: , Rfl:  .  SUMAtriptan (IMITREX) 100 MG tablet, Take 100 mg by mouth as needed., Disp: , Rfl:  .  tiZANidine (ZANAFLEX) 4 MG tablet, Take 1 tablet (4 mg total) by mouth 3 (three) times daily for 30 days., Disp: 90 tablet, Rfl: 0 .  traMADol (ULTRAM) 50 MG tablet, Take 1 tablet (50 mg total) by mouth every 12 (twelve) hours as needed for up to 30 days for severe pain. Month last 30 days., Disp: 60 tablet, Rfl: 0  ROS  Constitutional: Denies any fever or chills Gastrointestinal: No reported hemesis, hematochezia, vomiting, or acute GI distress Musculoskeletal: Denies any acute onset joint swelling, redness, loss of ROM, or weakness Neurological: No reported episodes of acute onset apraxia, aphasia, dysarthria, agnosia, amnesia, paralysis, loss of coordination, or loss of consciousness  Allergies  Ms. Persico is allergic to depakote [divalproex sodium] and keppra [levetiracetam].  [place]  Drug: Ms. Hamstra  reports no history of drug use. Alcohol:  reports previous alcohol use. Tobacco:  reports that she has never smoked. She has never used smokeless tobacco. Medical:  has a past medical history of Gastric bypass status for obesity, H/O unstable angina, Hypoglycemia, MI (myocardial infarction) ([place]), Orthostatic hypotension, Osteoporosis, Sciatic nerve disease, and Thyroid disease. Surgical: Ms. Donathan  has a past surgical history that includes Gastric bypass; Abdominal hysterectomy; Cholecystectomy; Tonsillectomy; Gastric bypass open; Oophorectomy; Hernia repair; Abdominal adhesion surgery; Gastrostomy w/ feeding tube; Dilation and curettage of uterus; and Splenectomy. Family: family history includes Asthma in her sister; COPD in her mother; Cancer in her mother; Epilepsy in her brother; Hypertension in her mother; Psoriasis in her sister.  Constitutional Exam  General appearance: Well nourished, well developed, and well hydrated. In no apparent acute  distress Vitals:   04/14/18 1014  BP: (!) 109/58  Pulse: 77  Temp: 97.6 F (36.4 C)  SpO2: 99%  Weight: 175 lb (79.4 kg)  Height: _0  (1.575 m)   BMI Assessment: Estimated body mass index is 32.01 kg/m as calculated from the following:   Height as of this encounter: _1  (1.575 m).   Weight as of this encounter: 175 lb (79.4 kg).  BMI interpretation table: BMI level Category Range association with higher incidence of chronic pain  <18 kg/m2 Underweight   18.5-24.9 kg/m2 Ideal body weight   25-29.9 kg/m2 Overweight Increased incidence by 20%  30-34.9 kg/m2 Obese (Class I) Increased incidence by 68%  35-39.9 kg/m2 Severe obesity (Class II) Increased incidence by 136%  >40 kg/m2 Extreme obesity (Class III) Increased incidence by 254%   Patient's current BMI Ideal Body weight  Body mass index is 32.01 kg/m. Ideal body weight: 50.1 kg (110 lb 7.2 oz) Adjusted ideal body weight: 61.8 kg (136 lb 4.3 oz)   BMI Readings from Last 4 Encounters:  04/14/18 32.01 kg/m  03/31/18 31.28 kg/m  01/15/18 27.44 kg/m   Wt Readings from Last 4 Encounters:  04/14/18 175 lb (79.4 kg)  03/31/18 171 lb (77.6 kg)  01/15/18 150 lb (68 kg)  Psych/Mental status: Alert, oriented x 3 (person, place, & time)       Eyes: PERLA Respiratory: No evidence of acute  respiratory distress  Cervical Spine Area Exam  Skin & Axial Inspection: No masses, redness, edema, swelling, or associated skin lesions Alignment: Symmetrical Functional ROM: Unrestricted ROM      Stability: No instability detected Muscle Tone/Strength: Functionally intact. No obvious neuro-muscular anomalies detected. Sensory (Neurological): Unimpaired Palpation: No palpable anomalies              Upper Extremity (UE) Exam    Side: Right upper extremity  Side: Left upper extremity  Skin & Extremity Inspection: Skin color, temperature, and hair growth are WNL. No peripheral edema or cyanosis. No masses, redness, swelling, asymmetry,  or associated skin lesions. No contractures.  Skin & Extremity Inspection: Skin color, temperature, and hair growth are WNL. No peripheral edema or cyanosis. No masses, redness, swelling, asymmetry, or associated skin lesions. No contractures.  Functional ROM: Unrestricted ROM          Functional ROM: Unrestricted ROM          Muscle Tone/Strength: Functionally intact. No obvious neuro-muscular anomalies detected.  Muscle Tone/Strength: Functionally intact. No obvious neuro-muscular anomalies detected.  Sensory (Neurological): Unimpaired          Sensory (Neurological): Unimpaired          Palpation: No palpable anomalies              Palpation: No palpable anomalies              Provocative Test(s):  Phalen's test: deferred Tinel's test: deferred Apley's scratch test (touch opposite shoulder):  Action 1 (Across chest): deferred Action 2 (Overhead): deferred Action 3 (LB reach): deferred   Provocative Test(s):  Phalen's test: deferred Tinel's test: deferred Apley's scratch test (touch opposite shoulder):  Action 1 (Across chest): deferred Action 2 (Overhead): deferred Action 3 (LB reach): deferred    Thoracic Spine Area Exam  Skin & Axial Inspection: No masses, redness, or swelling Alignment: Symmetrical Functional ROM: Unrestricted ROM Stability: No instability detected Muscle Tone/Strength: Functionally intact. No obvious neuro-muscular anomalies detected. Sensory (Neurological): Unimpaired Muscle strength & Tone: No palpable anomalies  Lumbar Spine Area Exam  Skin & Axial Inspection: No masses, redness, or swelling Alignment: Symmetrical Functional ROM: Decreased ROM affecting both sides (R>L) Stability: No instability detected Muscle Tone/Strength: Increased muscle tone over affected area Sensory (Neurological): Movement-associated pain Palpation: Complains of area being tender to palpation       Provocative Tests: Hyperextension/rotation test: (+) bilaterally for facet  joint pain. Lumbar quadrant test (Kemp's test): (+) bilaterally for facet joint pain. Lateral bending test: deferred today       Patrick's Maneuver: (+) for bilateral S-I arthralgia and for bilateral hip arthralgia FABER* test: (+) for bilateral S-I arthralgia and for bilateral hip arthralgia S-I anterior distraction/compression test: deferred today         S-I lateral compression test: deferred today         S-I Thigh-thrust test: deferred today         S-I Gaenslen's test: deferred today         *(Flexion, ABduction and External Rotation)  Gait & Posture Assessment  Ambulation: Unassisted Gait: Relatively normal for age and body habitus Posture: Difficulty standing up straight, due to pain   Lower Extremity Exam    Side: Right lower extremity  Side: Left lower extremity  Stability: No instability observed          Stability: No instability observed          Skin & Extremity Inspection: Skin color, temperature, and  hair growth are WNL. No peripheral edema or cyanosis. No masses, redness, swelling, asymmetry, or associated skin lesions. No contractures.  Skin & Extremity Inspection: Skin color, temperature, and hair growth are WNL. No peripheral edema or cyanosis. No masses, redness, swelling, asymmetry, or associated skin lesions. No contractures.  Functional ROM: Unrestricted ROM                  Functional ROM: Unrestricted ROM                  Muscle Tone/Strength: Able to Toe-walk & Heel-walk without problems  Muscle Tone/Strength: Able to Toe-walk & Heel-walk without problems  Sensory (Neurological): Dermatomal pain pattern bottom of foot (S1)  Sensory (Neurological): Dermatomal pain pattern lateral aspect of foot (S1)  DTR: Patellar: deferred today Achilles: deferred today Plantar: deferred today  DTR: Patellar: deferred today Achilles: deferred today Plantar: deferred today  Palpation: No palpable anomalies  Palpation: No palpable anomalies   Assessment & Plan  Primary  Diagnosis & Pertinent Problem List: The primary encounter diagnosis was Chronic pain syndrome. Diagnoses of Chronic low back pain (Primary Area of Pain) (Bilateral) (L>R) w/ sciatica, Lumbar facet syndrome (Bilateral) (L>R), DDD (degenerative disc disease), lumbar, Lumbar spondylosis, Chronic sacroiliac joint pain (Bilateral), Chronic lower extremity pain (Secondary Area of Pain) (Bilateral) (L>R), Chronic lumbar radiculitis (S1) (Bilateral), Chronic hip pain (Bilateral), Chronic knee pain (Bilateral) (L>R), Osteoarthritis of knees (Bilateral), Tricompartment osteoarthritis of knee (Right), Chronic neck pain (Tertiary Area of Pain) (Bilateral) (L>R), Occipital headache (Fourth Area of Pain) (Bilateral) (L>R), Intractable migraine without status migrainosus, unspecified migraine type, Neurogenic pain, and Chronic musculoskeletal pain were also pertinent to this visit.  Visit Diagnosis: 1. Chronic pain syndrome   2. Chronic low back pain (Primary Area of Pain) (Bilateral) (L>R) w/ sciatica   3. Lumbar facet syndrome (Bilateral) (L>R)   4. DDD (degenerative disc disease), lumbar   5. Lumbar spondylosis   6. Chronic sacroiliac joint pain (Bilateral)   7. Chronic lower extremity pain (Secondary Area of Pain) (Bilateral) (L>R)   8. Chronic lumbar radiculitis (S1) (Bilateral)   9. Chronic hip pain (Bilateral)   10. Chronic knee pain (Bilateral) (L>R)   11. Osteoarthritis of knees (Bilateral)   12. Tricompartment osteoarthritis of knee (Right)   13. Chronic neck pain (Tertiary Area of Pain) (Bilateral) (L>R)   14. Occipital headache (Fourth Area of Pain) (Bilateral) (L>R)   15. Intractable migraine without status migrainosus, unspecified migraine type   16. Neurogenic pain   17. Chronic musculoskeletal pain    Problems updated and reviewed during this visit: No problems updated.  Plan of Care  Pharmacotherapy (Medications Ordered): Meds ordered this encounter  Medications  . gabapentin  (NEURONTIN) 100 MG capsule    Sig: Take 1-3 capsules (100-300 mg total) by mouth 4 (four) times daily for 30 days. Follow written titration schedule.    Dispense:  360 capsule    Refill:  0  . tiZANidine (ZANAFLEX) 4 MG tablet    Sig: Take 1 tablet (4 mg total) by mouth 3 (three) times daily for 30 days.    Dispense:  90 tablet    Refill:  0    Do not place medication on "Automatic Refill". Fill one day early if pharmacy is closed on scheduled refill date.  . traMADol (ULTRAM) 50 MG tablet    Sig: Take 1 tablet (50 mg total) by mouth every 12 (twelve) hours as needed for up to 30 days for severe pain. Month last  30 days.    Dispense:  60 tablet    Refill:  0    [place] STOP ACT - Not applicable. Fill one day early if pharmacy is closed on scheduled refill date. Do not fill until: 04/14/18 . Must last 30 days. To last until: 05/14/18.    Procedure Orders     LUMBAR FACET(MEDIAL BRANCH NERVE BLOCK) MB[facility] Lab Orders  No laboratory test(s) ordered today   Imaging Orders  No imaging studies ordered today   Referral Orders  No referral(s) requested today    Pharmacological management options:  Opioid Analgesics: We'll take over management today. See above orders Membrane stabilizer: We have discussed the possibility of optimizing this mode of therapy, if tolerated Muscle relaxant: We have discussed the possibility of a trial NSAID: We have discussed the possibility of a trial Other analgesic(s): To be determined at a later time   Interventional management options: Planned, scheduled, and/or pending:    Diagnostic bilateral lumbar facet block #1 under fluoroscopic guidance and IV sedation    Considering:   Diagnostic midline lumbar ESI  Diagnostic bilateral lumbar facet block  Possible bilateral lumbar facet RFA  Diagnostic bilateral occipital nerve block  Possible bilateral occipital nerve RFA  Diagnostic bilateral cervical facet nerve block Possible bilateral lumbar facet RFA   Diagnostic left-sided sacroiliac joint injection  Possible left-sided sacroiliac joint RFA    PRN Procedures:   None at this time   Provider-requested follow-up: Return for Procedure (w/ sedation): (B) L-FCT BLK #1.  Future Appointments  Date Time Provider [place]  04/27/2018  9:45 AM Milinda Pointer, MD [facility] None    Primary Care Physician: Danelle Berry, NP Location: [facility] Note by: Gaspar Cola, MD Date: 04/14/2018; Time: 11:42 AM

## 2018-04-14 ENCOUNTER — Ambulatory Visit: Payer: BLUE CROSS/BLUE SHIELD | Attending: Pain Medicine | Admitting: Pain Medicine

## 2018-04-14 ENCOUNTER — Encounter: Payer: Self-pay | Admitting: Pain Medicine

## 2018-04-14 ENCOUNTER — Other Ambulatory Visit: Payer: Self-pay

## 2018-04-14 VITALS — BP 128/74 | HR 77 | Temp 98.3°F | Resp 16 | Ht 62.0 in | Wt 175.0 lb

## 2018-04-14 DIAGNOSIS — IMO0002 Reserved for concepts with insufficient information to code with codable children: Secondary | ICD-10-CM | POA: Insufficient documentation

## 2018-04-14 DIAGNOSIS — M792 Neuralgia and neuritis, unspecified: Secondary | ICD-10-CM | POA: Diagnosis present

## 2018-04-14 DIAGNOSIS — M533 Sacrococcygeal disorders, not elsewhere classified: Secondary | ICD-10-CM | POA: Insufficient documentation

## 2018-04-14 DIAGNOSIS — G894 Chronic pain syndrome: Secondary | ICD-10-CM

## 2018-04-14 DIAGNOSIS — M25552 Pain in left hip: Secondary | ICD-10-CM | POA: Diagnosis present

## 2018-04-14 DIAGNOSIS — M7918 Myalgia, other site: Secondary | ICD-10-CM | POA: Diagnosis present

## 2018-04-14 DIAGNOSIS — G43919 Migraine, unspecified, intractable, without status migrainosus: Secondary | ICD-10-CM

## 2018-04-14 DIAGNOSIS — M5136 Other intervertebral disc degeneration, lumbar region: Secondary | ICD-10-CM

## 2018-04-14 DIAGNOSIS — G8929 Other chronic pain: Secondary | ICD-10-CM | POA: Diagnosis present

## 2018-04-14 DIAGNOSIS — M25551 Pain in right hip: Secondary | ICD-10-CM | POA: Insufficient documentation

## 2018-04-14 DIAGNOSIS — R519 Headache, unspecified: Secondary | ICD-10-CM

## 2018-04-14 DIAGNOSIS — M25562 Pain in left knee: Secondary | ICD-10-CM | POA: Diagnosis present

## 2018-04-14 DIAGNOSIS — M542 Cervicalgia: Secondary | ICD-10-CM

## 2018-04-14 DIAGNOSIS — R51 Headache: Secondary | ICD-10-CM | POA: Diagnosis present

## 2018-04-14 DIAGNOSIS — M79605 Pain in left leg: Secondary | ICD-10-CM | POA: Insufficient documentation

## 2018-04-14 DIAGNOSIS — M170 Bilateral primary osteoarthritis of knee: Secondary | ICD-10-CM

## 2018-04-14 DIAGNOSIS — M1711 Unilateral primary osteoarthritis, right knee: Secondary | ICD-10-CM | POA: Diagnosis present

## 2018-04-14 DIAGNOSIS — M5416 Radiculopathy, lumbar region: Secondary | ICD-10-CM | POA: Diagnosis present

## 2018-04-14 DIAGNOSIS — M5441 Lumbago with sciatica, right side: Secondary | ICD-10-CM | POA: Insufficient documentation

## 2018-04-14 DIAGNOSIS — M47816 Spondylosis without myelopathy or radiculopathy, lumbar region: Secondary | ICD-10-CM | POA: Diagnosis present

## 2018-04-14 DIAGNOSIS — M5442 Lumbago with sciatica, left side: Secondary | ICD-10-CM | POA: Diagnosis not present

## 2018-04-14 DIAGNOSIS — M25561 Pain in right knee: Secondary | ICD-10-CM | POA: Diagnosis present

## 2018-04-14 DIAGNOSIS — M79604 Pain in right leg: Secondary | ICD-10-CM | POA: Insufficient documentation

## 2018-04-14 DIAGNOSIS — M51369 Other intervertebral disc degeneration, lumbar region without mention of lumbar back pain or lower extremity pain: Secondary | ICD-10-CM

## 2018-04-14 MED ORDER — TRAMADOL HCL 50 MG PO TABS: 50.0000 mg | ORAL_TABLET | Freq: Two times a day (BID) | ORAL | 0 refills | Status: DC | PRN

## 2018-04-14 MED ORDER — TIZANIDINE HCL 4 MG PO TABS: 4.0000 mg | ORAL_TABLET | Freq: Three times a day (TID) | ORAL | 0 refills | Status: DC

## 2018-04-14 MED ORDER — GABAPENTIN 100 MG PO CAPS: 100.0000 mg | ORAL_CAPSULE | Freq: Four times a day (QID) | ORAL | 0 refills | Status: DC

## 2018-04-14 NOTE — Patient Instructions (Addendum)
______________________________________________________________________________________________  Specialty Pain Scale  Introduction:  There are significant differences in how pain is reported. The word pain usually refers to physical pain, but it is also a common synonym of suffering. The medical community uses a scale from 0 (zero) to 10 (ten) to report pain level. Zero (0) is described as "no pain", while ten (10) is described as "the worse pain you can imagine". The problem with this scale is that physical pain is reported along with suffering. Suffering refers to mental pain, or more often yet it refers to any unpleasant feeling, emotion or aversion associated with the perception of harm or threat of harm. It is the psychological component of pain.  Pain Specialists prefer to separate the two components. The pain scale used by this practice is the Verbal Numerical Rating Scale (VNRS-11). This scale is for the physical pain only. DO NOT INCLUDE how your pain psychologically affects you. This scale is for adults 21 years of age and older. It has 11 (eleven) levels. The 1st level is 0/10. This means: "right now, I have no pain". In the context of pain management, it also means: "right now, my physical pain is under control with the current therapy".  General Information:  The scale should reflect your current level of pain. Unless you are specifically asked for the level of your worst pain, or your average pain. If you are asked for one of these two, then it should be understood that it is over the past 24 hours.  Levels 1 (one) through 5 (five) are described below, and can be treated as an outpatient. Ambulatory pain management facilities such as ours are more than adequate to treat these levels. Levels 6 (six) through 10 (ten) are also described below, however, these must be treated as a hospitalized patient. While levels 6 (six) and 7 (seven) may be evaluated at an urgent care facility, levels 8  (eight) through 10 (ten) constitute medical emergencies and as such, they belong in a hospital's emergency department. When having these levels (as described below), do not come to our office. Our facility is not equipped to manage these levels. Go directly to an urgent care facility or an emergency department to be evaluated.  Definitions:  Activities of Daily Living (ADL): Activities of daily living (ADL or ADLs) is a term used in healthcare to refer to people's daily self-care activities. Health professionals often use a person's ability or inability to perform ADLs as a measurement of their functional status, particularly in regard to people post injury, with disabilities and the elderly. There are two ADL levels: Basic and Instrumental. Basic Activities of Daily Living (BADL  or BADLs) consist of self-care tasks that include: Bathing and showering; personal hygiene and grooming (including brushing/combing/styling hair); dressing; Toilet hygiene (getting to the toilet, cleaning oneself, and getting back up); eating and self-feeding (not including cooking or chewing and swallowing); functional mobility, often referred to as "transferring", as measured by the ability to walk, get in and out of bed, and get into and out of a chair; the broader definition (moving from one place to another while performing activities) is useful for people with different physical abilities who are still able to get around independently. Basic ADLs include the things many people do when they get up in the morning and get ready to go out of the house: get out of bed, go to the toilet, bathe, dress, groom, and eat. On the average, loss of function typically follows a particular order.   Hygiene is the first to go, followed by loss of toilet use and locomotion. The last to go is the ability to eat. When there is only one remaining area in which the person is independent, there is a 62.9% chance that it is eating and only a 3.5% chance  that it is hygiene. Instrumental Activities of Daily Living (IADL or IADLs) are not necessary for fundamental functioning, but they let an individual live independently in a community. IADL consist of tasks that include: cleaning and maintaining the house; home establishment and maintenance; care of others (including selecting and supervising caregivers); care of pets; child rearing; managing money; managing financials (investments, etc.); meal preparation and cleanup; shopping for groceries and necessities; moving within the community; safety procedures and emergency responses; health management and maintenance (taking prescribed medications); and using the telephone or other form of communication.  Instructions:  Most patients tend to report their pain as a combination of two factors, their physical pain and their psychosocial pain. This last one is also known as "suffering" and it is reflection of how physical pain affects you socially and psychologically. From now on, report them separately.  From this point on, when asked to report your pain level, report only your physical pain. Use the following table for reference.  Pain Clinic Pain Levels (0-5/10)  Pain Level Score  Description  No Pain 0   Mild pain 1 Nagging, annoying, but does not interfere with basic activities of daily living (ADL). Patients are able to eat, bathe, get dressed, toileting (being able to get on and off the toilet and perform personal hygiene functions), transfer (move in and out of bed or a chair without assistance), and maintain continence (able to control bladder and bowel functions). Blood pressure and heart rate are unaffected. A normal heart rate for a healthy adult ranges from 60 to 100 bpm (beats per minute).   Mild to moderate pain 2 Noticeable and distracting. Impossible to hide from other people. More frequent flare-ups. Still possible to adapt and function close to normal. It can be very annoying and may have  occasional stronger flare-ups. With discipline, patients may get used to it and adapt.   Moderate pain 3 Interferes significantly with activities of daily living (ADL). It becomes difficult to feed, bathe, get dressed, get on and off the toilet or to perform personal hygiene functions. Difficult to get in and out of bed or a chair without assistance. Very distracting. With effort, it can be ignored when deeply involved in activities.   Moderately severe pain 4 Impossible to ignore for more than a few minutes. With effort, patients may still be able to manage work or participate in some social activities. Very difficult to concentrate. Signs of autonomic nervous system discharge are evident: dilated pupils (mydriasis); mild sweating (diaphoresis); sleep interference. Heart rate becomes elevated (>115 bpm). Diastolic blood pressure (lower number) rises above 100 mmHg. Patients find relief in laying down and not moving.   Severe pain 5 Intense and extremely unpleasant. Associated with frowning face and frequent crying. Pain overwhelms the senses.  Ability to do any activity or maintain social relationships becomes significantly limited. Conversation becomes difficult. Pacing back and forth is common, as getting into a comfortable position is nearly impossible. Pain wakes you up from deep sleep. Physical signs will be obvious: pupillary dilation; increased sweating; goosebumps; brisk reflexes; cold, clammy hands and feet; nausea, vomiting or dry heaves; loss of appetite; significant sleep disturbance with inability to fall asleep or to   remain asleep. When persistent, significant weight loss is observed due to the complete loss of appetite and sleep deprivation.  Blood pressure and heart rate becomes significantly elevated. Caution: If elevated blood pressure triggers a pounding headache associated with blurred vision, then the patient should immediately seek attention at an urgent or emergency care unit, as  these may be signs of an impending stroke.    Emergency Department Pain Levels (6-10/10)  Emergency Room Pain 6 Severely limiting. Requires emergency care and should not be seen or managed at an outpatient pain management facility. Communication becomes difficult and requires great effort. Assistance to reach the emergency department may be required. Facial flushing and profuse sweating along with potentially dangerous increases in heart rate and blood pressure will be evident.   Distressing pain 7 Self-care is very difficult. Assistance is required to transport, or use restroom. Assistance to reach the emergency department will be required. Tasks requiring coordination, such as bathing and getting dressed become very difficult.   Disabling pain 8 Self-care is no longer possible. At this level, pain is disabling. The individual is unable to do even the most "basic" activities such as walking, eating, bathing, dressing, transferring to a bed, or toileting. Fine motor skills are lost. It is difficult to think clearly.   Incapacitating pain 9 Pain becomes incapacitating. Thought processing is no longer possible. Difficult to remember your own name. Control of movement and coordination are lost.   The worst pain imaginable 10 At this level, most patients pass out from pain. When this level is reached, collapse of the autonomic nervous system occurs, leading to a sudden drop in blood pressure and heart rate. This in turn results in a temporary and dramatic drop in blood flow to the brain, leading to a loss of consciousness. Fainting is one of the body's self defense mechanisms. Passing out puts the brain in a calmed state and causes it to shut down for a while, in order to begin the healing process.    Summary: 1. Refer to this scale when providing [place] with your pain level. 2. Be accurate and careful when reporting your pain level. This will help with your care. 3. Over-reporting your pain level will  lead to loss of credibility. 4. Even a level of 1/10 means that there is pain and will be treated at our facility. 5. High, inaccurate reporting will be documented as "Symptom Exaggeration", leading to loss of credibility and suspicions of possible secondary gains such as obtaining more narcotics, or wanting to appear disabled, for fraudulent reasons. 6. Only pain levels of 5 or below will be seen at our facility. 7. Pain levels of 6 and above will be sent to the Emergency Department and the appointment cancelled. ______________________________________________________________________________________________   ____________________________________________________________________________________________  Medication Rules  Purpose: To inform patients, and their family members, of our rules and regulations.  Applies to: All patients receiving prescriptions (written or electronic).  Pharmacy of record: Pharmacy where electronic prescriptions will be sent. If written prescriptions are taken to a different pharmacy, please inform the nursing staff. The pharmacy listed in the electronic medical record should be the one where you would like electronic prescriptions to be sent.  Electronic prescriptions: In compliance with the [place] (STOP) Act of 2017 (Session Lanny Cramp [phone]), effective March 10, 2018, all controlled substances must be electronically prescribed. Calling prescriptions to the pharmacy will cease to exist.  Prescription refills: Only during scheduled appointments. Applies to all prescriptions.  NOTE: The  following applies primarily to controlled substances (Opioid* Pain Medications).   Patient's responsibilities: 1. Pain Pills: Bring all pain pills to every appointment (except for procedure appointments). 2. Pill Bottles: Bring pills in original pharmacy bottle. Always bring the newest bottle. Bring bottle, even if empty. 3. Medication  refills: You are responsible for knowing and keeping track of what medications you take and those you need refilled. The day before your appointment: write a list of all prescriptions that need to be refilled. The day of the appointment: give the list to the admitting nurse. Prescriptions will be written only during appointments. If you forget a medication: it will not be "Called in", "Faxed", or "electronically sent". You will need to get another appointment to get these prescribed. No early refills. Do not call asking to have your prescription filled early. 4. Prescription Accuracy: You are responsible for carefully inspecting your prescriptions before leaving our office. Have the discharge nurse carefully go over each prescription with you, before taking them home. Make sure that your name is accurately spelled, that your address is correct. Check the name and dose of your medication to make sure it is accurate. Check the number of pills, and the written instructions to make sure they are clear and accurate. Make sure that you are given enough medication to last until your next medication refill appointment. 5. Taking Medication: Take medication as prescribed. When it comes to controlled substances, taking less pills or less frequently than prescribed is permitted and encouraged. Never take more pills than instructed. Never take medication more frequently than prescribed.  6. Inform other Doctors: Always inform, all of your healthcare providers, of all the medications you take. 7. Pain Medication from other Providers: You are not allowed to accept any additional pain medication from any other Doctor or Healthcare provider. There are two exceptions to this rule. (see below) In the event that you require additional pain medication, you are responsible for notifying us, as stated below. 8. Medication Agreement: You are responsible for carefully reading and following our Medication Agreement. This must be  signed before receiving any prescriptions from our practice. Safely store a copy of your signed Agreement. Violations to the Agreement will result in no further prescriptions. (Additional copies of our Medication Agreement are available upon request.) 9. Laws, Rules, & Regulations: All patients are expected to follow all Federal and [employer], [employer], Rules, [employer] [facility]. Ignorance of the Laws does not constitute a valid excuse. The use of any illegal substances is prohibited. 10. Adopted CDC guidelines & recommendations: Target dosing levels will be at or below 60 MME/day. Use of benzodiazepines** is not recommended.  Exceptions: There are only two exceptions to the rule of not receiving pain medications from other Healthcare Providers. 1. Exception #1 (Emergencies): In the event of an emergency (i.e.: accident requiring emergency care), you are allowed to receive additional pain medication. However, you are responsible for: As soon as you are able, call our office [phone], at any time of the day or night, and leave a message stating your name, the date and nature of the emergency, and the name and dose of the medication prescribed. In the event that your call is answered by a member of our staff, make sure to document and save the date, time, and the name of the person that took your information.  2. Exception #2 (Planned Surgery): In the event that you are scheduled by another doctor or dentist to have any type of surgery or procedure,  you are allowed (for a period no longer than 30 days), to receive additional pain medication, for the acute post-op pain. However, in this case, you are responsible for picking up a copy of our "Post-op Pain Management for Surgeons" handout, and giving it to your surgeon or dentist. This document is available at our office, and does not require an appointment to obtain it. Simply go to our office during business hours (Monday-Thursday from 8:00 AM to 4:00 PM)  (Friday 8:00 AM to 12:00 Noon) or if you have a scheduled appointment with [place], prior to your surgery, and ask for it by name. In addition, you will need to provide [place] with your name, name of your surgeon, type of surgery, and date of procedure or surgery.  *Opioid medications include: morphine, codeine, oxycodone, oxymorphone, hydrocodone, hydromorphone, meperidine, tramadol, tapentadol, buprenorphine, fentanyl, methadone. **Benzodiazepine medications include: diazepam (Valium), alprazolam (Xanax), clonazepam (Klonopine), lorazepam (Ativan), clorazepate (Tranxene), chlordiazepoxide (Librium), estazolam (Prosom), oxazepam (Serax), temazepam (Restoril), triazolam (Halcion) (Last updated: 05/07/2017) ____________________________________________________________________________________________   ____________________________________________________________________________________________  Medication Recommendations and Reminders  Applies to: All patients receiving prescriptions (written and/or electronic).  Medication Rules & Regulations: These rules and regulations exist for your safety and that of others. They are not flexible and neither are we. Dismissing or ignoring them will be considered "non-compliance" with medication therapy, resulting in complete and irreversible termination of such therapy. (See document titled "Medication Rules" for more details.) In all conscience, because of safety reasons, we cannot continue providing a therapy where the patient does not follow instructions.  Pharmacy of record:   Definition: This is the pharmacy where your electronic prescriptions will be sent.   We do not endorse any particular pharmacy.  You are not restricted in your choice of pharmacy.  The pharmacy listed in the electronic medical record should be the one where you want electronic prescriptions to be sent.  If you choose to change pharmacy, simply notify our nursing staff of your choice of  new pharmacy.  Recommendations:  Keep all of your pain medications in a safe place, under lock and key, even if you live alone.   After you fill your prescription, take 1 week's worth of pills and put them away in a safe place. You should keep a separate, properly labeled bottle for this purpose. The remainder should be kept in the original bottle. Use this as your primary supply, until it runs out. Once it's gone, then you know that you have 1 week's worth of medicine, and it is time to come in for a prescription refill. If you do this correctly, it is unlikely that you will ever run out of medicine.  To make sure that the above recommendation works, it is very important that you make sure your medication refill appointments are scheduled at least 1 week before you run out of medicine. To do this in an effective manner, make sure that you do not leave the office without scheduling your next medication management appointment. Always ask the nursing staff to show you in your prescription , when your medication will be running out. Then arrange for the receptionist to get you a return appointment, at least 7 days before you run out of medicine. Do not wait until you have 1 or 2 pills left, to come in. This is very poor planning and does not take into consideration that we may need to cancel appointments due to bad weather, sickness, or emergencies affecting our staff.  "Partial Fill": If for any reason  your pharmacy does not have enough pills/tablets to completely fill or refill your prescription, do not allow for a "partial fill". You will need a separate prescription to fill the remaining amount, which we will not provide. If the reason for the partial fill is your insurance, you will need to talk to the pharmacist about payment alternatives for the remaining tablets, but again, do not accept a partial fill.  Prescription refills and/or changes in medication(s):   Prescription refills, and/or changes  in dose or medication, will be conducted only during scheduled medication management appointments. (Applies to both, written and electronic prescriptions.)  No refills on procedure days. No medication will be changed or started on procedure days. No changes, adjustments, and/or refills will be conducted on a procedure day. Doing so will interfere with the diagnostic portion of the procedure.  No phone refills. No medications will be "called into the pharmacy".  No Fax refills.  No weekend refills.  No Holliday refills.  No after hours refills.  Remember:  Business hours are:  Monday to Thursday 8:00 AM to 4:00 PM Provider's Schedule: Dionisio David, NP - Appointments are:  Medication management: Monday to Thursday 8:00 AM to 4:00 PM Milinda Pointer, MD - Appointments are:  Medication management: Monday and Wednesday 8:00 AM to 4:00 PM Procedure day: Tuesday and Thursday 7:30 AM to 4:00 PM Gillis Santa, MD - Appointments are:  Medication management: Tuesday and Thursday 8:00 AM to 4:00 PM Procedure day: Monday and Wednesday 7:30 AM to 4:00 PM (Last update: 05/07/2017) ____________________________________________________________________________________________   ____________________________________________________________________________________________  CANNABIDIOL (AKA: CBD Oil or Pills)  Applies to: All patients receiving prescriptions of controlled substances (written and/or electronic).  General Information: Cannabidiol (CBD) was discovered in 49. It is one of some 113 identified cannabinoids in cannabis (Marijuana) plants, accounting for up to 40% of the plant's extract. As of 2018, preliminary clinical research on cannabidiol included studies of anxiety, cognition, movement disorders, and pain.  Cannabidiol is consummed in multiple ways, including inhalation of cannabis smoke or vapor, as an aerosol spray into the cheek, and by mouth. It may be supplied as CBD oil  containing CBD as the active ingredient (no added tetrahydrocannabinol (THC) or terpenes), a full-plant CBD-dominant hemp extract oil, capsules, dried cannabis, or as a liquid solution. CBD is thought not have the same psychoactivity as THC, and may affect the actions of THC. Studies suggest that CBD may interact with different biological targets, including cannabinoid receptors and other neurotransmitter receptors. As of 2018 the mechanism of action for its biological effects has not been determined.  In the [place], cannabidiol has a limited approval by the Food and Drug Administration (FDA) for treatment of only two types of epilepsy disorders. The side effects of long-term use of the drug include somnolence, decreased appetite, diarrhea, fatigue, malaise, weakness, sleeping problems, and others.  CBD remains a Schedule I drug prohibited for any use.  Legality: Some manufacturers ship CBD products nationally, an illegal action which the FDA has not enforced in 2018, with CBD remaining the subject of an FDA investigational new drug evaluation, and is not considered legal as a dietary supplement or food ingredient as of December 2018. Federal illegality has made it difficult historically to conduct research on CBD. CBD is openly sold in head shops and health food stores in some states where such sales have not been explicitly legalized.  Warning: Because it is not FDA approved for general use or treatment of pain, it is not required to  undergo the same manufacturing controls as prescription drugs.  This means that the available cannabidiol (CBD) may be contaminated with THC.  If this is the case, it will trigger a positive urine drug screen (UDS) test for cannabinoids (Marijuana).  Because a positive UDS for illicit substances is a violation of our medication agreement, your opioid analgesics (pain medicine) may be permanently discontinued. (Last update:  05/28/2017) ____________________________________________________________________________________________   ____________________________________________________________________________________________  Gabapentin Titration  Medication used: Gabapentin (Generic Name) or Neurontin (Brand Name) 100 mg tablets/capsules  Reasons to stop increasing the dose:  Reason 1: You get good relief of symptoms, in which case there is no need to increase the daily dose any further.    Reason 2: You develop some side effects, such as sleeping all of the time, difficulty concentrating, or becoming disoriented, in which case you need to go down on the dose, to the prior level, where you were not experiencing any side effects. Stay on that dose longer, to allow more time for your body to get use it, before attempting to increase it again.   Steps to increase medication: Step 1: Start by taking 1 (one) tablet at bedtime x 7 (seven) days.  Step 2: Increase dose to 2 (two) tablets at bedtime. Stay on this dose x 7 (seven) days.  Step 3: Next increase it to 3 (three) tablets at bedtime. Stay on this dose x another 7 (seven) days.  Step 4: Next, add 1 (one) tablet at noon with lunch. Continue this dose x another 7 (seven) days.  Step 5: Add 1 (one) tablet in the afternoon with dinner. Stay on this dose x another 7 (seven) days.  Step 6: At this point you should be taking the medicine 4 (four) times a day. This daily regimen of taking the medicine 4 (four) times a day, will be maintained from now on. You should not take any doses any sooner than every 6 (six) hours.  Step 7: After 7 (seven) days of taking 3 (three) tablet at bedtime, 1 (one) tablet at noon, 1 (one) tablet in the afternoon, and 1 (one) tablet in the morning, begin taking 2 (two) tablets at noon with lunch. Stay on this dose x another 7 (seven) days.   Step 8: After 7 (seven) days of taking 3 (three) tablet at bedtime, 2 (two) tablets at noon, 1  (one) tablet in the afternoon, and 1 (one) tablet in the morning, begin taking 2 (two) tablets in the afternoon with dinner. Stay on this dose x another 7 (seven) days.   Step 9: After 7 (seven) days of taking 3 (three) tablet at bedtime, 2 (two) tablets at noon, 2 (two) tablets in the afternoon, and 1 (one) tablet in the morning, begin taking 2 (two) tablets in the morning with breakfast. Stay on this dose x another 7 (seven) days. At this point you should be taking the medicine 4 (four) times a day, or about every 6 (six) hours. This daily regimen of taking the medicine 4 (four) times a day, will be maintained from now on. You should not take any doses any sooner than every 6 (six) hours.  Step 10: After 7 (seven) days of taking 3 (three) tablet at bedtime, 2 (two) tablets at noon, 2 (two) tablets in the afternoon, and 2 (two) tablets in the morning, begin taking 3 (three) tablets at noon with lunch. Stay on this dose x another 7 (seven) days.   Step 11: After 7 (seven) days of taking  3 (three) tablet at bedtime, 3 (three) tablets at noon, 2 (two) tablets in the afternoon, and 2 (two) tablets in the morning, begin taking 3 (three) tablets in the afternoon with dinner. Stay on this dose x another 7 (seven) days.   Step 12: After 7 (seven) days of taking 3 (three) tablet at bedtime, 3 (three) tablets at noon, 3 (three) tablets in the afternoon, and 2 (two) tablet in the morning, begin taking 3 (three) tablets in the morning with breakfast. Stay on this dose x another 7 (seven) days. At this point you should be taking the medicine 4 (four) times a day, or about every 6 (six) hours. This daily regimen of taking the medicine 4 (four) times a day, will be maintained from now on.   Endpoint: Once you have reached the maximum dose you can tolerate without side-effects, contact your physician so as to evaluate the results of the regimen.   Questions: Feel free to contact us for any questions or problems at  [phone] ____________________________________________________________________________________________

## 2018-04-15 ENCOUNTER — Telehealth: Payer: Self-pay | Admitting: Pain Medicine

## 2018-04-15 ENCOUNTER — Telehealth: Payer: Self-pay

## 2018-04-15 NOTE — Telephone Encounter (Signed)
Pt states that she needs PA for tramadol she uses [facility] on [address]

## 2018-04-19 ENCOUNTER — Other Ambulatory Visit: Payer: Self-pay

## 2018-04-25 ENCOUNTER — Emergency Department: Payer: BLUE CROSS/BLUE SHIELD

## 2018-04-25 ENCOUNTER — Emergency Department
Admission: EM | Admit: 2018-04-25 | Discharge: 2018-04-25 | Disposition: A | Discharge status: Home / Self Care | Payer: BLUE CROSS/BLUE SHIELD | Attending: Emergency Medicine | Admitting: Emergency Medicine

## 2018-04-25 ENCOUNTER — Encounter: Payer: Self-pay | Admitting: Emergency Medicine

## 2018-04-25 DIAGNOSIS — I252 Old myocardial infarction: Secondary | ICD-10-CM | POA: Insufficient documentation

## 2018-04-25 DIAGNOSIS — R05 Cough: Secondary | ICD-10-CM

## 2018-04-25 DIAGNOSIS — E039 Hypothyroidism, unspecified: Secondary | ICD-10-CM | POA: Insufficient documentation

## 2018-04-25 DIAGNOSIS — Z79899 Other long term (current) drug therapy: Secondary | ICD-10-CM | POA: Insufficient documentation

## 2018-04-25 DIAGNOSIS — R55 Syncope and collapse: Secondary | ICD-10-CM | POA: Diagnosis present

## 2018-04-25 DIAGNOSIS — R059 Cough, unspecified: Secondary | ICD-10-CM

## 2018-04-25 LAB — CBC
HCT: 30.6 % — ABNORMAL LOW (ref 36.0–46.0)
Hemoglobin: 9.4 g/dL — ABNORMAL LOW (ref 12.0–15.0)
MCH: 27.1 pg (ref 26.0–34.0)
MCHC: 30.7 g/dL (ref 30.0–36.0)
MCV: 88.2 fL (ref 80.0–100.0)
NRBC: 0 % (ref 0.0–0.2)
Platelets: 392 10*3/uL (ref 150–400)
RBC: 3.47 MIL/uL — ABNORMAL LOW (ref 3.87–5.11)
RDW: 13.9 % (ref 11.5–15.5)
WBC: 11.5 10*3/uL — AB (ref 4.0–10.5)

## 2018-04-25 LAB — BASIC METABOLIC PANEL
Anion gap: 3 — ABNORMAL LOW (ref 5–15)
BUN: 6 mg/dL (ref 6–20)
CO2: 26 mmol/L (ref 22–32)
Calcium: 7.9 mg/dL — ABNORMAL LOW (ref 8.9–10.3)
Chloride: 107 mmol/L (ref 98–111)
Creatinine, Ser: 0.63 mg/dL (ref 0.44–1.00)
GFR calc non Af Amer: >60 mL/min (ref >60)
Glucose, Bld: 103 mg/dL — ABNORMAL HIGH (ref 70–99)
Potassium: 4 mmol/L (ref 3.5–5.1)
SODIUM: 136 mmol/L (ref 135–145)

## 2018-04-25 LAB — URINALYSIS, COMPLETE (UACMP) WITH MICROSCOPIC
Bilirubin Urine: NEGATIVE
Glucose, UA: NEGATIVE mg/dL
Hgb urine dipstick: NEGATIVE
Ketones, ur: NEGATIVE mg/dL
Leukocytes,Ua: NEGATIVE
Nitrite: NEGATIVE
Protein, ur: NEGATIVE mg/dL
Specific Gravity, Urine: 1.002 — ABNORMAL LOW (ref 1.005–1.030)
pH: 6 (ref 5.0–8.0)

## 2018-04-25 LAB — TROPONIN I: Troponin I: <0.03 ng/mL (ref <0.03)

## 2018-04-25 MED ORDER — PREDNISONE 20 MG PO TABS
60.0000 mg | ORAL_TABLET | Freq: Once | ORAL | Status: AC
  Administered 2018-04-25: 60 mg via ORAL
  Filled 2018-04-25: qty 3

## 2018-04-25 MED ORDER — IPRATROPIUM-ALBUTEROL 0.5-2.5 (3) MG/3ML IN SOLN
3.0000 mL | Freq: Once | RESPIRATORY_TRACT | Status: AC
  Administered 2018-04-25: 3 mL via RESPIRATORY_TRACT
  Filled 2018-04-25: qty 3

## 2018-04-25 MED ORDER — GUAIFENESIN-DM 100-10 MG/5ML PO SYRP: 5.0000 mL | ORAL_SOLUTION | ORAL | 0 refills | Status: DC | PRN

## 2018-04-25 MED ORDER — PREDNISONE 20 MG PO TABS: 60.0000 mg | ORAL_TABLET | Freq: Every day | ORAL | 0 refills | Status: DC

## 2018-04-25 MED ORDER — ALBUTEROL SULFATE HFA 108 (90 BASE) MCG/ACT IN AERS: 2.0000 | INHALATION_SPRAY | Freq: Four times a day (QID) | RESPIRATORY_TRACT | 2 refills | Status: DC | PRN

## 2018-04-25 MED ORDER — SODIUM CHLORIDE 0.9 % IV BOLUS
1000.0000 mL | Freq: Once | INTRAVENOUS | Status: AC
  Administered 2018-04-25: 1000 mL via INTRAVENOUS

## 2018-04-25 MED ORDER — SODIUM CHLORIDE 0.9% FLUSH: 3.0000 mL | Freq: Once | INTRAVENOUS | Status: DC

## 2018-04-25 NOTE — ED Provider Notes (Addendum)
[facility] Emergency Department Provider Note  ____________________________________________   I have reviewed the triage vital signs and the nursing notes. Where available I have reviewed prior notes and, if possible and indicated, outside hospital notes.    HISTORY  Chief Complaint Loss of Consciousness    HPI Diana Quinn is a 53 y.o. female  With a history of gastric bypass bipolar disorder, heart disease in the past she states, presents today complaining of runny nose, sore throat and cough for the last week.  She states she stood up suddenly from bed because she thought her sugar might be low after a coughing fit, and she felt lightheaded she did not completely pass out but she did lay back suddenly into her bed.  Took a few minutes to recover but there was no seizure, no loss of consciousness no biting of her tongue etc.  Patient's complaint is that she has a persistent dry cough.  She states that she has had it for a week no chest pain no nausea no vomiting no leg swelling no positional symptoms positive very high community burden of multiple different viral syndromes similar to this.  No recent history of [place] travel or exposure to coronavirus     Past Medical History:  Diagnosis Date  . Gastric bypass status for obesity   . H/O unstable angina   . Hypoglycemia   . MI (myocardial infarction) (HCC)   . Orthostatic hypotension   . Osteoporosis   . Sciatic nerve disease   . Thyroid disease     Patient Active Problem List   Diagnosis Date Noted  . Chronic hip pain (Bilateral) 04/14/2018  . Chronic sacroiliac joint pain (Bilateral) 04/14/2018  . Lumbar facet syndrome (Bilateral) (L>R) 04/14/2018  . Neurogenic pain 04/14/2018  . Chronic musculoskeletal pain 04/14/2018  . Chronic lumbar radiculitis (S1) (Bilateral) 04/14/2018  . Osteoarthritis of knees (Bilateral) 04/13/2018  . Tricompartment osteoarthritis of knee (Right) 04/13/2018  . Chronic  low back pain (Primary Area of Pain) (Bilateral) (L>R) w/ sciatica 03/31/2018  . Chronic lower extremity pain (Secondary Area of Pain) (Bilateral) (L>R) 03/31/2018  . Chronic neck pain [facility] Area of Pain) (Bilateral) (L>R) 03/31/2018  . Occipital headache (Fourth Area of Pain) (Bilateral) (L>R) 03/31/2018  . Pharmacologic therapy 03/31/2018  . Disorder of skeletal system 03/31/2018  . Problems influencing health status 03/31/2018  . Chronic knee pain (Bilateral) (L>R) 03/31/2018  . Chronic pain syndrome 03/31/2018  . DDD (degenerative disc disease), lumbar 02/08/2018  . Other and unspecified hyperlipidemia 02/08/2018  . Lumbar spondylosis 02/08/2018  . Vitamin D deficiency 02/08/2018  . Angina pectoris (HCC) 01/26/2018  . Hypotension 01/26/2018  . Impaired glucose tolerance 01/26/2018  . Other B-complex deficiencies 01/26/2018  . Schizophrenia (HCC) 01/26/2018  . Epilepsy (HCC) 07/10/2011  . Bariatric surgery status 03/24/2011  . Bipolar disorder (HCC) 03/24/2011  . Generalized anxiety disorder 03/24/2011  . Hypothyroidism 03/24/2011  . Migraine headache 03/24/2011  . Sleep disturbances 03/24/2011    Past Surgical History:  Procedure Laterality Date  . ABDOMINAL ADHESION SURGERY    . ABDOMINAL HYSTERECTOMY    . CHOLECYSTECTOMY    . DILATION AND CURETTAGE OF UTERUS    . GASTRIC BYPASS    . GASTRIC BYPASS OPEN     revision  . GASTROSTOMY W/ FEEDING TUBE    . HERNIA REPAIR    . OOPHORECTOMY    . SPLENECTOMY    . TONSILLECTOMY      Prior to Admission medications  Medication Sig Start Date End Date Taking? Authorizing Provider  alendronate (FOSAMAX) 70 MG tablet Take 70 mg by mouth once a week. 02/19/18   [provider]  ARIPiprazole ER (ABILIFY MAINTENA) 400 MG PRSY prefilled syringe Inject 400 mg into the muscle every 30 (thirty) days. 01/26/18   [provider]  butalbital-aspirin-caffeine Benny Lennert) 50-325-40 MG capsule Take 1 capsule by mouth  every 6 (six) hours as needed for headache.    [provider]  Calcium Carb-Cholecalciferol (CALTRATE 600+D3) 600-800 MG-UNIT TABS Take 1 tablet by mouth 2 (two) times daily. 01/26/18   [provider]  cyanocobalamin (,VITAMIN B-12,) 1000 MCG/ML injection Inject 1,000 mcg into the muscle every 30 (thirty) days. 02/25/18   [provider]  ergocalciferol (VITAMIN D2) 1.25 MG (50000 UT) capsule Take 1.25 mcg by mouth every 30 (thirty) days. 02/08/18   [provider]  FYAVOLV 0.5-2.5 MG-MCG tablet Take 1 tablet by mouth daily. 02/19/18   [provider]  gabapentin (NEURONTIN) 100 MG capsule Take 1-3 capsules (100-300 mg total) by mouth 4 (four) times daily for 30 days. Follow written titration schedule. 04/14/18 05/14/18  Delano Metz, MD  lactulose (CHRONULAC) 10 GM/15ML solution Take 15 mLs by mouth as needed. 01/26/18   [provider]  levothyroxine (LEVOXYL) 88 MCG tablet Take 88 mcg by mouth daily. 01/26/18   [provider]  nitroGLYCERIN (NITROSTAT) 0.4 MG SL tablet Place 0.4 mg under the tongue as needed. 01/26/18   [provider]  ondansetron (ZOFRAN) 8 MG tablet Take 8 mg by mouth as needed. 01/26/18   [provider]  Oxcarbazepine (TRILEPTAL) 300 MG tablet Take 300 mg by mouth 2 (two) times daily. 01/26/18   [provider]  QUEtiapine (SEROQUEL) 50 MG tablet Take 50 mg by mouth at bedtime. 03/15/18   [provider]  rosuvastatin (CRESTOR) 5 MG tablet Take 5 mg by mouth daily. 02/08/18   [provider]  SUMAtriptan (IMITREX) 100 MG tablet Take 100 mg by mouth as needed. 01/26/18   [provider]  tiZANidine (ZANAFLEX) 4 MG tablet Take 1 tablet (4 mg total) by mouth 3 (three) times daily for 30 days. 04/14/18 05/14/18  Delano Metz, MD  traMADol (ULTRAM) 50 MG tablet Take 1 tablet (50 mg total) by mouth every 12 (twelve) hours as needed for up to 30 days for severe  pain. Month last 30 days. 04/14/18 05/14/18  Delano Metz, MD    Allergies Depakote [divalproex sodium] and Keppra [levetiracetam]  Family History  Problem Relation Age of Onset  . Cancer Mother   . Hypertension Mother   . COPD Mother   . Asthma Sister   . Psoriasis Sister   . Epilepsy Brother     Social History Social History   Tobacco Use  . Smoking status: Never Smoker  . Smokeless tobacco: Never Used  Substance Use Topics  . Alcohol use: Not Currently  . Drug use: Never    Review of Systems Constitutional: No fever/chills Eyes: No visual changes. ENT: Positive sore throat. No stiff neck no neck pain Cardiovascular: Denies chest pain. Respiratory: Denies shortness of breath.  Persistent cough Gastrointestinal:   no vomiting.  No diarrhea.  No constipation. Genitourinary: Negative for dysuria. Musculoskeletal: Negative lower extremity swelling Skin: Negative for rash. Neurological: Negative for severe headaches, focal weakness or numbness.   ____________________________________________   PHYSICAL EXAM:  VITAL SIGNS: ED Triage Vitals  Enc Vitals Group     BP 04/25/18 1222 114/82  Pulse Rate 04/25/18 1222 87     Resp 04/25/18 1222 18     Temp 04/25/18 1222 98.3 F (36.8 C)     Temp Source 04/25/18 1222 Oral     SpO2 04/25/18 1222 99 %     Weight --      Height --      Head Circumference --      Peak Flow --      Pain Score 04/25/18 1225 6     Pain Loc --      Pain Edu? --      Excl. in GC? --     Constitutional: Alert and oriented. Well appearing and in no acute distress. Eyes: Conjunctivae are normal Head: Atraumatic HEENT: Positive clear congestion/rhinnorhea. Mucous membranes are moist.  Oropharynx non-erythematous positive cobblestoning Neck:   Nontender with no meningismus, no masses, no stridor Cardiovascular: Normal rate, regular rhythm. Grossly normal heart sounds.  Good peripheral circulation. Respiratory: Normal respiratory effort.   No retractions. Lungs CTAB.  Positive cough appreciated however Abdominal: Soft and nontender. No distention. No guarding no rebound Back:  There is no focal tenderness or step off.  there is no midline tenderness there are no lesions noted. there is no CVA tenderness Musculoskeletal: No lower extremity tenderness, no upper extremity tenderness. No joint effusions, no DVT signs strong distal pulses no edema Neurologic:  Normal speech and language. No gross focal neurologic deficits are appreciated.  Skin:  Skin is warm, dry and intact. No rash noted. Psychiatric: Mood and affect are normal. Speech and behavior are normal.  ____________________________________________   LABS (all labs ordered are listed, but only abnormal results are displayed)  Labs Reviewed  BASIC METABOLIC PANEL - Abnormal; Notable for the following components:      Result Value   Glucose, Bld 103 (*)    Calcium 7.9 (*)    Anion gap 3 (*)    All other components within normal limits  CBC - Abnormal; Notable for the following components:   WBC 11.5 (*)    RBC 3.47 (*)    Hemoglobin 9.4 (*)    HCT 30.6 (*)    All other components within normal limits  URINALYSIS, COMPLETE (UACMP) WITH MICROSCOPIC - Abnormal; Notable for the following components:   Color, Urine COLORLESS (*)    APPearance CLEAR (*)    Specific Gravity, Urine 1.002 (*)    Bacteria, UA FEW (*)    All other components within normal limits  TROPONIN I  CBG MONITORING, ED    Pertinent labs  results that were available during my care of the patient were reviewed by me and considered in my medical decision making (see chart for details). ____________________________________________  EKG  I personally interpreted any EKGs ordered by me or triage Sinus rhythm rate 86 bpm no acute ST elevation, nonspecific ST changes, borderline right bundle blanch block noted.  No change from  prior ____________________________________________  RADIOLOGY  Pertinent labs & imaging results that were available during my care of the patient were reviewed by me and considered in my medical decision making (see chart for details). If possible, patient and/or family made aware of any abnormal findings.  No results found. ____________________________________________    PROCEDURES  Procedure(s) performed: None  Procedures  Critical Care performed: None  ____________________________________________   INITIAL IMPRESSION / ASSESSMENT AND PLAN / ED COURSE  Pertinent labs & imaging results that were available during my care of the patient were reviewed by me and considered in  my medical decision making (see chart for details).  With cough and cold symptoms, lungs are clear obtain chest x-ray, low suspicion for ACS PE dissection, will give her some IV fluids that she is eating less today, but she is hungry.  No evidence of PE or cardiac diseases causing this URI.  ----------------------------------------- 5:20 PM on 04/25/2018 -----------------------------------------  Feels much better after albuterol still with a cough, we will send her with steroids, albuterol, cough medication and close outpatient follow-up return precautions given understood.  At this time, there does not appear to be clinical evidence to support the diagnosis of pulmonary embolus, dissection, myocarditis, endocarditis, pericarditis, pericardial tamponade, acute coronary syndrome, pneumothorax, pneumonia, or any other acute intrathoracic pathology that will require admission or acute intervention. Nor is there evidence of any significant intra-abdominal pathology causing these symptoms    ____________________________________________   FINAL CLINICAL IMPRESSION(S) / ED DIAGNOSES  Final diagnoses:  None      This chart was dictated using voice recognition software.  Despite best efforts to proofread,   errors can occur which can change meaning.      Jeanmarie PlantMcShane, Caress Reffitt A, MD 04/25/18 1620    Jeanmarie PlantMcShane, Ticia Virgo A, MD 04/25/18 [phone]

## 2018-04-25 NOTE — ED Triage Notes (Signed)
Pt presents with body aches, fever, cough. She reports that she passed out this morning while sitting up in bed, thought she might be having hypoglycemic episode, and checked her blood sugar when she awakened and it was 110. Pt states she has had flu-like symptoms for 1 week. Pt alert & oriented, nad noted.

## 2018-04-27 ENCOUNTER — Other Ambulatory Visit: Payer: Self-pay

## 2018-04-27 ENCOUNTER — Ambulatory Visit (HOSPITAL_BASED_OUTPATIENT_CLINIC_OR_DEPARTMENT_OTHER): Payer: BLUE CROSS/BLUE SHIELD | Admitting: Pain Medicine

## 2018-04-27 ENCOUNTER — Encounter: Payer: Self-pay | Admitting: Pain Medicine

## 2018-04-27 ENCOUNTER — Ambulatory Visit
Admission: RE | Admit: 2018-04-27 | Discharge: 2018-04-27 | Disposition: A | Discharge status: Home / Self Care | Payer: BLUE CROSS/BLUE SHIELD | Source: Ambulatory Visit | Attending: Pain Medicine | Admitting: Pain Medicine

## 2018-04-27 VITALS — BP 126/71 | HR 73 | Temp 98.2°F | Resp 17 | Ht 62.0 in | Wt 175.0 lb

## 2018-04-27 DIAGNOSIS — M4125 Other idiopathic scoliosis, thoracolumbar region: Secondary | ICD-10-CM

## 2018-04-27 DIAGNOSIS — M5136 Other intervertebral disc degeneration, lumbar region: Secondary | ICD-10-CM | POA: Diagnosis present

## 2018-04-27 DIAGNOSIS — G8929 Other chronic pain: Secondary | ICD-10-CM | POA: Insufficient documentation

## 2018-04-27 DIAGNOSIS — M47816 Spondylosis without myelopathy or radiculopathy, lumbar region: Secondary | ICD-10-CM

## 2018-04-27 DIAGNOSIS — M47817 Spondylosis without myelopathy or radiculopathy, lumbosacral region: Secondary | ICD-10-CM | POA: Diagnosis present

## 2018-04-27 DIAGNOSIS — M545 Low back pain: Secondary | ICD-10-CM | POA: Insufficient documentation

## 2018-04-27 MED ORDER — LIDOCAINE HCL 2 % IJ SOLN
20.0000 mL | Freq: Once | INTRAMUSCULAR | Status: AC
  Administered 2018-04-27: 400 mg
  Filled 2018-04-27: qty 40

## 2018-04-27 MED ORDER — TRIAMCINOLONE ACETONIDE 40 MG/ML IJ SUSP
80.0000 mg | Freq: Once | INTRAMUSCULAR | Status: AC
  Administered 2018-04-27: 80 mg
  Filled 2018-04-27: qty 2

## 2018-04-27 MED ORDER — FENTANYL CITRATE (PF) 100 MCG/2ML IJ SOLN
25.0000 ug | INTRAMUSCULAR | Status: DC | PRN
  Administered 2018-04-27: 50 ug via INTRAVENOUS
  Filled 2018-04-27: qty 2

## 2018-04-27 MED ORDER — LACTATED RINGERS IV SOLN
1000.0000 mL | Freq: Once | INTRAVENOUS | Status: AC
  Administered 2018-04-27: 1000 mL via INTRAVENOUS

## 2018-04-27 MED ORDER — ROPIVACAINE HCL 2 MG/ML IJ SOLN
18.0000 mL | Freq: Once | INTRAMUSCULAR | Status: AC
  Administered 2018-04-27: 18 mL via PERINEURAL
  Filled 2018-04-27: qty 20

## 2018-04-27 MED ORDER — MIDAZOLAM HCL 5 MG/5ML IJ SOLN
1.0000 mg | INTRAMUSCULAR | Status: DC | PRN
  Administered 2018-04-27: 4 mg via INTRAVENOUS
  Filled 2018-04-27: qty 5

## 2018-04-27 NOTE — Progress Notes (Signed)
Patient's Name: Diana Quinn  MRN: [id number]  Referring Provider: Fayrene Helper, NP  DOB: 10/28/1965  PCP: Fayrene Helper, NP  DOS: 04/27/2018  Note by: Oswaldo Done, MD  Service setting: Ambulatory outpatient  Specialty: Interventional Pain Management  Patient type: Established  Location: [facility] (AMB) Pain Management Facility  Visit type: Interventional Procedure   Primary Reason for Visit: Interventional Pain Management Treatment. CC: Back Pain (lower)  Procedure:          Anesthesia, Analgesia, Anxiolysis:  Type: Lumbar Facet, Medial Branch Block(s) #1  Primary Purpose: Diagnostic Region: Posterolateral Lumbosacral Spine Level: L2, L3, L4, L5, & S1 Medial Branch Level(s). Injecting these levels blocks the L3-4, L4-5, and L5-S1 lumbar facet joints. Laterality: Bilateral  Type: Moderate (Conscious) Sedation combined with Local Anesthesia Indication(s): Analgesia and Anxiety Route: Intravenous (IV) IV Access: Secured Sedation: Meaningful verbal contact was maintained at all times during the procedure  Local Anesthetic: Lidocaine 1-2%  Position: Prone   Indications: 1. Spondylosis without myelopathy or radiculopathy, lumbosacral region   2. Lumbar facet syndrome (Bilateral) (L>R)   3. Lumbar spondylosis   4. DDD (degenerative disc disease), lumbar   5. Chronic low back pain (Bilateral) (L>R) w/o sciatica   6. Dextroconvex rotatory scoliosis    Pain Score: Pre-procedure: 7 /10 Post-procedure: 0-No pain/10  Pre-op Assessment:  Diana Quinn is a 53 y.o. (year old), female patient, seen today for interventional treatment. She  has a past surgical history that includes Gastric bypass; Abdominal hysterectomy; Cholecystectomy; Tonsillectomy; Gastric bypass open; Oophorectomy; Hernia repair; Abdominal adhesion surgery; Gastrostomy w/ feeding tube; Dilation and curettage of uterus; and Splenectomy. Diana Quinn has a current medication list which includes the following  prescription(s): albuterol, alendronate, aripiprazole er, butalbital-aspirin-caffeine, calcium carb-cholecalciferol, cyanocobalamin, ergocalciferol, fyavolv, gabapentin, guaifenesin-dextromethorphan, isosorbide dinitrate, lactulose, levothyroxine, nitroglycerin, ondansetron, oxcarbazepine, prednisone, quetiapine, ranolazine, rosuvastatin, sumatriptan, tizanidine, and tramadol, and the following Facility-Administered Medications: fentanyl and midazolam. Her primarily concern today is the Back Pain (lower)  Initial Vital Signs:  Pulse/HCG Rate: 73ECG Heart Rate: 76 Temp: 98.2 F (36.8 C) Resp: 18 BP: 137/74 SpO2: 98 %  BMI: Estimated body mass index is 32.01 kg/m as calculated from the following:   Height as of this encounter: 5\' 2"  (1.575 m).   Weight as of this encounter: 175 lb (79.4 kg).  Risk Assessment: Allergies: Reviewed. She is allergic to depakote [divalproex sodium] and keppra [levetiracetam].  Allergy Precautions: None required Coagulopathies: Reviewed. None identified.  Blood-thinner therapy: None at this time Active Infection(s): Reviewed. None identified. Diana Quinn is afebrile  Site Confirmation: Diana Quinn was asked to confirm the procedure and laterality before marking the site Procedure checklist: Completed Consent: Before the procedure and under the influence of no sedative(s), amnesic(s), or anxiolytics, the patient was informed of the treatment options, risks and possible complications. To fulfill our ethical and legal obligations, as recommended by the American Medical Association's Code of Ethics, I have informed the patient of my clinical impression; the nature and purpose of the treatment or procedure; the risks, benefits, and possible complications of the intervention; the alternatives, including doing nothing; the risk(s) and benefit(s) of the alternative treatment(s) or procedure(s); and the risk(s) and benefit(s) of doing nothing. The patient was provided  information about the general risks and possible complications associated with the procedure. These may include, but are not limited to: failure to achieve desired goals, infection, bleeding, organ or nerve damage, allergic reactions, paralysis, and death. In addition, the patient was informed of those risks and  complications associated to Spine-related procedures, such as failure to decrease pain; infection (i.e.: Meningitis, epidural or intraspinal abscess); bleeding (i.e.: epidural hematoma, subarachnoid hemorrhage, or any other type of intraspinal or peri-dural bleeding); organ or nerve damage (i.e.: Any type of peripheral nerve, nerve root, or spinal cord injury) with subsequent damage to sensory, motor, and/or autonomic systems, resulting in permanent pain, numbness, and/or weakness of one or several areas of the body; allergic reactions; (i.e.: anaphylactic reaction); and/or death. Furthermore, the patient was informed of those risks and complications associated with the medications. These include, but are not limited to: allergic reactions (i.e.: anaphylactic or anaphylactoid reaction(s)); adrenal axis suppression; blood sugar elevation that in diabetics may result in ketoacidosis or comma; water retention that in patients with history of congestive heart failure may result in shortness of breath, pulmonary edema, and decompensation with resultant heart failure; weight gain; swelling or edema; medication-induced neural toxicity; particulate matter embolism and blood vessel occlusion with resultant organ, and/or nervous system infarction; and/or aseptic necrosis of one or more joints. Finally, the patient was informed that Medicine is not an exact science; therefore, there is also the possibility of unforeseen or unpredictable risks and/or possible complications that may result in a catastrophic outcome. The patient indicated having understood very clearly. We have given the patient no guarantees and we  have made no promises. Enough time was given to the patient to ask questions, all of which were answered to the patient's satisfaction. Diana Quinn has indicated that she wanted to continue with the procedure. Attestation: I, the ordering provider, attest that I have discussed with the patient the benefits, risks, side-effects, alternatives, likelihood of achieving goals, and potential problems during recovery for the procedure that I have provided informed consent. Date  Time: 04/27/2018  9:36 AM  Pre-Procedure Preparation:  Monitoring: As per clinic protocol. Respiration, ETCO2, SpO2, BP, heart rate and rhythm monitor placed and checked for adequate function Safety Precautions: Patient was assessed for positional comfort and pressure points before starting the procedure. Time-out: I initiated and conducted the "Time-out" before starting the procedure, as per protocol. The patient was asked to participate by confirming the accuracy of the "Time Out" information. Verification of the correct person, site, and procedure were performed and confirmed by me, the nursing staff, and the patient. "Time-out" conducted as per Joint Commission's Universal Protocol (UP.01.01.01). Time: 1048  Description of Procedure:          Laterality: Bilateral. The procedure was performed in identical fashion on both sides. Levels:  L2, L3, L4, L5, & S1 Medial Branch Level(s) Area Prepped: Posterior Lumbosacral Region Prepping solution: ChloraPrep (2% chlorhexidine gluconate and 70% isopropyl alcohol) Safety Precautions: Aspiration looking for blood return was conducted prior to all injections. At no point did we inject any substances, as a needle was being advanced. Before injecting, the patient was told to immediately notify me if she was experiencing any new onset of "ringing in the ears, or metallic taste in the mouth". No attempts were made at seeking any paresthesias. Safe injection practices and needle disposal  techniques used. Medications properly checked for expiration dates. SDV (single dose vial) medications used. After the completion of the procedure, all disposable equipment used was discarded in the proper designated medical waste containers. Local Anesthesia: Protocol guidelines were followed. The patient was positioned over the fluoroscopy table. The area was prepped in the usual manner. The time-out was completed. The target area was identified using fluoroscopy. A 12-in long, straight, sterile hemostat was used  with fluoroscopic guidance to locate the targets for each level blocked. Once located, the skin was marked with an approved surgical skin marker. Once all sites were marked, the skin (epidermis, dermis, and hypodermis), as well as deeper tissues (fat, connective tissue and muscle) were infiltrated with a small amount of a short-acting local anesthetic, loaded on a 10cc syringe with a 25G, 1.5-in  Needle. An appropriate amount of time was allowed for local anesthetics to take effect before proceeding to the next step. Local Anesthetic: Lidocaine 2.0% The unused portion of the local anesthetic was discarded in the proper designated containers. Technical explanation of process:  L2 Medial Branch Nerve Block (MBB): The target area for the L2 medial branch is at the junction of the postero-lateral aspect of the superior articular process and the superior, posterior, and medial edge of the transverse process of L3. Under fluoroscopic guidance, a Quincke needle was inserted until contact was made with os over the superior postero-lateral aspect of the pedicular shadow (target area). After negative aspiration for blood, 0.5 mL of the nerve block solution was injected without difficulty or complication. The needle was removed intact. L3 Medial Branch Nerve Block (MBB): The target area for the L3 medial branch is at the junction of the postero-lateral aspect of the superior articular process and the superior,  posterior, and medial edge of the transverse process of L4. Under fluoroscopic guidance, a Quincke needle was inserted until contact was made with os over the superior postero-lateral aspect of the pedicular shadow (target area). After negative aspiration for blood, 0.5 mL of the nerve block solution was injected without difficulty or complication. The needle was removed intact. L4 Medial Branch Nerve Block (MBB): The target area for the L4 medial branch is at the junction of the postero-lateral aspect of the superior articular process and the superior, posterior, and medial edge of the transverse process of L5. Under fluoroscopic guidance, a Quincke needle was inserted until contact was made with os over the superior postero-lateral aspect of the pedicular shadow (target area). After negative aspiration for blood, 0.5 mL of the nerve block solution was injected without difficulty or complication. The needle was removed intact. L5 Medial Branch Nerve Block (MBB): The target area for the L5 medial branch is at the junction of the postero-lateral aspect of the superior articular process and the superior, posterior, and medial edge of the sacral ala. Under fluoroscopic guidance, a Quincke needle was inserted until contact was made with os over the superior postero-lateral aspect of the pedicular shadow (target area). After negative aspiration for blood, 0.5 mL of the nerve block solution was injected without difficulty or complication. The needle was removed intact. S1 Medial Branch Nerve Block (MBB): The target area for the S1 medial branch is at the posterior and inferior 6 o'clock position of the L5-S1 facet joint. Under fluoroscopic guidance, the Quincke needle inserted for the L5 MBB was redirected until contact was made with os over the inferior and postero aspect of the sacrum, at the 6 o' clock position under the L5-S1 facet joint (Target area). After negative aspiration for blood, 0.5 mL of the nerve block  solution was injected without difficulty or complication. The needle was removed intact.  Nerve block solution: 0.2% PF-Ropivacaine + Triamcinolone (40 mg/mL) diluted to a final concentration of 4 mg of Triamcinolone/mL of Ropivacaine The unused portion of the solution was discarded in the proper designated containers. Procedural Needles: 22-gauge, 3.5-inch, Quincke needles used for all levels.  Once the  entire procedure was completed, the treated area was cleaned, making sure to leave some of the prepping solution back to take advantage of its long term bactericidal properties.   Illustration of the posterior view of the lumbar spine and the posterior neural structures. Laminae of L2 through S1 are labeled. DPRL5, dorsal primary ramus of L5; DPRS1, dorsal primary ramus of S1; DPR3, dorsal primary ramus of L3; FJ, facet (zygapophyseal) joint L3-L4; I, inferior articular process of L4; LB1, lateral branch of dorsal primary ramus of L1; IAB, inferior articular branches from L3 medial branch (supplies L4-L5 facet joint); IBP, intermediate branch plexus; MB3, medial branch of dorsal primary ramus of L3; NR3, third lumbar nerve root; S, superior articular process of L5; SAB, superior articular branches from L4 (supplies L4-5 facet joint also); TP3, transverse process of L3.  Vitals:   04/27/18 1050 04/27/18 1055 04/27/18 1100 04/27/18 1102  BP: 116/70 121/71 126/77 126/71  Pulse:      Resp: 16 18 16 17   Temp:      TempSrc:      SpO2: 97% 97% 98% 99%  Weight:      Height:         Start Time: 1048 hrs. End Time: 1102 hrs.  Imaging Guidance (Spinal):          Type of Imaging Technique: Fluoroscopy Guidance (Spinal) Indication(s): Assistance in needle guidance and placement for procedures requiring needle placement in or near specific anatomical locations not easily accessible without such assistance. Exposure Time: Please see nurses notes. Contrast: None used. Fluoroscopic Guidance: I was  personally present during the use of fluoroscopy. "Tunnel Vision Technique" used to obtain the best possible view of the target area. Parallax error corrected before commencing the procedure. "Direction-depth-direction" technique used to introduce the needle under continuous pulsed fluoroscopy. Once target was reached, antero-posterior, oblique, and lateral fluoroscopic projection used confirm needle placement in all planes. Images permanently stored in EMR. Interpretation: No contrast injected. I personally interpreted the imaging intraoperatively. Adequate needle placement confirmed in multiple planes. Permanent images saved into the patient's record.  Antibiotic Prophylaxis:   Anti-infectives (From admission, onward)   None     Indication(s): None identified  Post-operative Assessment:  Post-procedure Vital Signs:  Pulse/HCG Rate: 7386 Temp: 98.2 F (36.8 C) Resp: 17 BP: 126/71 SpO2: 99 %  EBL: None  Complications: No immediate post-treatment complications observed by team, or reported by patient.  Note: The patient tolerated the entire procedure well. A repeat set of vitals were taken after the procedure and the patient was kept under observation following institutional policy, for this type of procedure. Post-procedural neurological assessment was performed, showing return to baseline, prior to discharge. The patient was provided with post-procedure discharge instructions, including a section on how to identify potential problems. Should any problems arise concerning this procedure, the patient was given instructions to immediately contact us, at any time, without hesitation. In any case, we plan to contact the patient by telephone for a follow-up status report regarding this interventional procedure.  Comments:  No additional relevant information.  Plan of Care    Imaging Orders     DG C-Arm 1-60 Min-No Report  Procedure Orders     LUMBAR FACET(MEDIAL BRANCH NERVE BLOCK)  MBNB  Medications ordered for procedure: Meds ordered this encounter  Medications  . lidocaine (XYLOCAINE) 2 % (with pres) injection 400 mg  . midazolam (VERSED) 5 MG/5ML injection 1-2 mg    Make sure Flumazenil is available in the pyxis when  using this medication. If oversedation occurs, administer 0.2 mg IV over 15 sec. If after 45 sec no response, administer 0.2 mg again over 1 min; may repeat at 1 min intervals; not to exceed 4 doses (1 mg)  . fentaNYL (SUBLIMAZE) injection 25-50 mcg    Make sure Narcan is available in the pyxis when using this medication. In the event of respiratory depression (RR< 8/min): Titrate NARCAN (naloxone) in increments of 0.1 to 0.2 mg IV at 2-3 minute intervals, until desired degree of reversal.  . lactated ringers infusion 1,000 mL  . ropivacaine (PF) 2 mg/mL (0.2%) (NAROPIN) injection 18 mL  . triamcinolone acetonide (KENALOG-40) injection 80 mg   Medications administered: We administered lidocaine, midazolam, fentaNYL, lactated ringers, ropivacaine (PF) 2 mg/mL (0.2%), and triamcinolone acetonide.  See the medical record for exact dosing, route, and time of administration.  Disposition: Discharge home  Discharge Date & Time: 04/27/2018; 1133 hrs.   Physician-requested Follow-up: Return for PPE (2 wks), w/ Dr. Laban Emperor.  Future Appointments  Date Time Provider Department Center  05/12/2018  1:30 PM Delano Metz, MD [facility] None   Primary Care Physician: Fayrene Helper, NP Location: [facility] Note by: Oswaldo Done, MD Date: 04/27/2018; Time: 12:45 PM  Disclaimer:  Medicine is not an Visual merchandiser. The only guarantee in medicine is that nothing is guaranteed. It is important to note that the decision to proceed with this intervention was based on the information collected from the patient. The Data and conclusions were drawn from the patient's questionnaire, the interview, and the physical examination.  Because the information was provided in large part by the patient, it cannot be guaranteed that it has not been purposely or unconsciously manipulated. Every effort has been made to obtain as much relevant data as possible for this evaluation. It is important to note that the conclusions that lead to this procedure are derived in large part from the available data. Always take into account that the treatment will also be dependent on availability of resources and existing treatment guidelines, considered by other Pain Management Practitioners as being common knowledge and practice, at the time of the intervention. For Medico-Legal purposes, it is also important to point out that variation in procedural techniques and pharmacological choices are the acceptable norm. The indications, contraindications, technique, and results of the above procedure should only be interpreted and judged by a Board-Certified Interventional Pain Specialist with extensive familiarity and expertise in the same exact procedure and technique.

## 2018-04-27 NOTE — Patient Instructions (Signed)

## 2018-04-27 NOTE — Progress Notes (Signed)
Safety precautions to be maintained throughout the outpatient stay will include: orient to surroundings, keep bed in low position, maintain call bell within reach at all times, provide assistance with transfer out of bed and ambulation.  

## 2018-04-28 ENCOUNTER — Telehealth: Payer: Self-pay | Admitting: *Deleted

## 2018-04-28 NOTE — Telephone Encounter (Signed)
No problems post procedure. 

## 2018-05-10 NOTE — Progress Notes (Signed)
Patient's Name: Diana Quinn  MRN: [id number]  Referring Provider: Danelle Berry, NP  DOB: 13-Apr-1965  PCP: Danelle Berry, NP  DOS: 05/12/2018  Note by: Gaspar Cola, MD  Service setting: Ambulatory outpatient  Specialty: Interventional Pain Management  Location: [facility] (AMB) Pain Management Facility    Patient type: Established   Primary Reason(s) for Visit: Encounter for post-procedure evaluation of chronic illness with mild to moderate exacerbation CC: Back Pain (lower bilaterally ); Neck Pain (bulging disc and arthritis); Headache; and Shoulder Pain (right )  HPI  Diana Quinn is a 53 y.o. year old, female patient, who comes today for a post-procedure evaluation. She has Angina pectoris ([place]); Bariatric surgery status; Bipolar disorder ([place]); DDD (degenerative disc disease), lumbar; Epilepsy ([place]); Generalized anxiety disorder; Hypotension; Hypothyroidism; Impaired glucose tolerance; Migraine headache; Other and unspecified hyperlipidemia; Other B-complex deficiencies; Schizophrenia ([place]); Sleep disturbances; Lumbar spondylosis; Vitamin D deficiency; Chronic low back pain (Primary Area of Pain) (Bilateral) (L>R) w/ sciatica; Chronic lower extremity pain (Secondary Area of Pain) (Bilateral) (L>R); Chronic neck pain (Tertiary Area of Pain) (Bilateral) (L>R); Occipital headache (Fourth Area of Pain) (Bilateral) (L>R); Pharmacologic therapy; Disorder of skeletal system; Problems influencing health status; Chronic knee pain (Bilateral) (L>R); Chronic pain syndrome; Osteoarthritis of knees (Bilateral); Tricompartment osteoarthritis of knee (Right); Chronic hip pain (Bilateral); Chronic sacroiliac joint pain (Bilateral); Lumbar facet syndrome (Bilateral) (L>R); Neurogenic pain; Chronic musculoskeletal pain; Chronic lumbar radiculitis (S1) (Bilateral); Spondylosis without myelopathy or radiculopathy, lumbosacral region; Chronic low back pain (Bilateral) (L>R) w/o sciatica; and Dextroconvex rotatory  scoliosis on their problem list. Her primarily concern today is the Back Pain (lower bilaterally ); Neck Pain (bulging disc and arthritis); Headache; and Shoulder Pain (right )  Pain Assessment: Location: Lower, Left, Right Back(see visit info for additional pain sites. ) Radiating: back pain into leg down to the ankle.  shoulder pain into right arm  Onset: More than a month ago Duration: Chronic pain Quality: Discomfort, Pressure, Sharp, Aching, Dull, Constant, Tender, Sore Severity: 8 /10 (subjective, self-reported pain score)  Note: Reported level is compatible with observation.                         When using our objective Pain Scale, levels between 6 and 10/10 are said to belong in an emergency room, as it progressively worsens from a 6/10, described as severely limiting, requiring emergency care not usually available at an outpatient pain management facility. At a 6/10 level, communication becomes difficult and requires great effort. Assistance to reach the emergency department may be required. Facial flushing and profuse sweating along with potentially dangerous increases in heart rate and blood pressure will be evident. Effect on ADL: sleeping is a difficult process.  sleep disruption.  Timing: Constant Modifying factors: heat helps the lower back.  ice, tizanadine, tramadol, ibuprofen  BP: 126/76  HR: 65  Diana Quinn comes in today for post-procedure evaluation.  Further details on both, my assessment(s), as well as the proposed treatment plan, please see below.  Post-Procedure Assessment  04/27/2018 Procedure: Diagnostic bilateral lumbar facet block #1 under fluoroscopic guidance and IV sedation  Pre-procedure pain score:        /10 Post-procedure pain score: 0/10         Influential Factors: BMI: 32.01 kg/m Intra-procedural challenges: None observed.         Assessment challenges: None detected.              Reported side-effects: None.  Post-procedural adverse  reactions or complications: None reported         Sedation: Please see nurses note. When no sedatives are used, the analgesic levels obtained are directly associated to the effectiveness of the local anesthetics. However, when sedation is provided, the level of analgesia obtained during the initial 1 hour following the intervention, is believed to be the result of a combination of factors. These factors may include, but are not limited to: 1. The effectiveness of the local anesthetics used. 2. The effects of the analgesic(s) and/or anxiolytic(s) used. 3. The degree of discomfort experienced by the patient at the time of the procedure. 4. The patients ability and reliability in recalling and recording the events. 5. The presence and influence of possible secondary gains and/or psychosocial factors. Reported result: Relief experienced during the 1st hour after the procedure: 100 % (Ultra-Short Term Relief) Ms. Payment has indicated area to have been numb during this time. Interpretative annotation: Clinically appropriate result. Analgesia during this period is likely to be Local Anesthetic and/or IV Sedative (Analgesic/Anxiolytic) related.          Effects of local anesthetic: The analgesic effects attained during this period are directly associated to the localized infiltration of local anesthetics and therefore cary significant diagnostic value as to the etiological location, or anatomical origin, of the pain. Expected duration of relief is directly dependent on the pharmacodynamics of the local anesthetic used. Long-acting (4-6 hours) anesthetics used.  Reported result: Relief during the next 4 to 6 hour after the procedure: 50 % (Short-Term Relief)            Interpretative annotation: Clinically appropriate result. Analgesia during this period is likely to be Local Anesthetic-related.          Long-term benefit: Defined as the period of time past the expected duration of local anesthetics (1 hour  for short-acting and 4-6 hours for long-acting). With the possible exception of prolonged sympathetic blockade from the local anesthetics, benefits during this period are typically attributed to, or associated with, other factors such as analgesic sensory neuropraxia, antiinflammatory effects, or beneficial biochemical changes provided by agents other than the local anesthetics.  Reported result: Extended relief following procedure: 100 % (Long-Term Relief) Ms. Basara reports that both, extremity and the axial pain improved with the treatment. Interpretative annotation: Clinically possible results. Good relief. No permanent benefit expected. Inflammation plays a part in the etiology to the pain.          Current benefits: Defined as reported results that persistent at this point in time.   Analgesia: 0 %            Function: Somewhat improved ROM: Somewhat improved Interpretative annotation: Recurrence of symptoms. No permanent benefit expected. Effective diagnostic intervention.          Interpretation: Results would suggest a successful diagnostic intervention.                  Plan:  Please see "Plan of Care" for details.                Laboratory Chemistry  Inflammation Markers (CRP: Acute Phase) (ESR: Chronic Phase) Lab Results  Component Value Date   CRP <1 03/31/2018   ESRSEDRATE 21 03/31/2018                         Renal Markers Lab Results  Component Value Date   BUN 6 04/25/2018   CREATININE 0.63 04/25/2018  BCR 9 03/31/2018   GFRAA >60 04/25/2018   GFRNONAA >60 04/25/2018                             Hepatic Markers Lab Results  Component Value Date   AST 26 03/31/2018   ALT 20 10/05/2011   ALBUMIN 4.2 03/31/2018                        Note: Lab results reviewed.  Recent Imaging Results   Results for orders placed in visit on 04/27/18  DG C-Arm 1-60 Min-No Report   Narrative Fluoroscopy was utilized by the requesting physician.  No radiographic   interpretation.    Interpretation Report: Fluoroscopy was used during the procedure to assist with needle guidance. The images were interpreted intraoperatively by the requesting physician.        Meds   Current Outpatient Medications:  .  AJOVY 225 MG/1.5ML SOSY, Inject 225 mg into the skin every 30 (thirty) days., Disp: , Rfl:  .  albuterol (PROVENTIL HFA;VENTOLIN HFA) 108 (90 Base) MCG/ACT inhaler, Inhale 2 puffs into the lungs every 6 (six) hours as needed for wheezing or shortness of breath., Disp: 1 Inhaler, Rfl: 2 .  alendronate (FOSAMAX) 70 MG tablet, Take 70 mg by mouth once a week., Disp: , Rfl:  .  ARIPiprazole ER (ABILIFY MAINTENA) 400 MG PRSY prefilled syringe, Inject 400 mg into the muscle every 30 (thirty) days., Disp: , Rfl:  .  butalbital-aspirin-caffeine (FIORINAL) 50-325-40 MG capsule, Take 1 capsule by mouth every 6 (six) hours as needed for headache., Disp: , Rfl:  .  Calcium Carb-Cholecalciferol (CALTRATE 600+D3) 600-800 MG-UNIT TABS, Take 1 tablet by mouth 2 (two) times daily., Disp: , Rfl:  .  cetirizine (ZYRTEC) 10 MG tablet, Take 10 mg by mouth daily., Disp: , Rfl:  .  cyanocobalamin (,VITAMIN B-12,) 1000 MCG/ML injection, Inject 1,000 mcg into the muscle every 30 (thirty) days., Disp: , Rfl:  .  ergocalciferol (VITAMIN D2) 1.25 MG (50000 UT) capsule, Take 1.25 mcg by mouth every 30 (thirty) days., Disp: , Rfl:  .  FYAVOLV 0.5-2.5 MG-MCG tablet, Take 1 tablet by mouth daily., Disp: , Rfl:  .  [START ON 05/14/2018] gabapentin (NEURONTIN) 100 MG capsule, Take 1-3 capsules (100-300 mg total) by mouth 4 (four) times daily for 30 days. Follow written titration schedule., Disp: 360 capsule, Rfl: 0 .  isosorbide dinitrate (ISORDIL) 30 MG tablet, Take 30 mg by mouth daily., Disp: , Rfl:  .  lactulose (CHRONULAC) 10 GM/15ML solution, Take 15 mLs by mouth as needed., Disp: , Rfl:  .  levothyroxine (LEVOXYL) 88 MCG tablet, Take 88 mcg by mouth daily., Disp: , Rfl:  .   nitroGLYCERIN (NITROSTAT) 0.4 MG SL tablet, Place 0.4 mg under the tongue as needed., Disp: , Rfl:  .  ondansetron (ZOFRAN) 8 MG tablet, Take 8 mg by mouth as needed., Disp: , Rfl:  .  Oxcarbazepine (TRILEPTAL) 300 MG tablet, Take 300 mg by mouth 2 (two) times daily., Disp: , Rfl:  .  QUEtiapine (SEROQUEL) 50 MG tablet, Take 50 mg by mouth at bedtime., Disp: , Rfl:  .  ranolazine (RANEXA) 500 MG 12 hr tablet, Take 500 mg by mouth 2 (two) times daily., Disp: , Rfl:  .  rosuvastatin (CRESTOR) 5 MG tablet, Take 5 mg by mouth daily., Disp: , Rfl:  .  SUMAtriptan (IMITREX) 100 MG tablet, Take 100  mg by mouth as needed., Disp: , Rfl:  .  [START ON 05/14/2018] tiZANidine (ZANAFLEX) 4 MG tablet, Take 1 tablet (4 mg total) by mouth 3 (three) times daily for 30 days., Disp: 90 tablet, Rfl: 0 .  [START ON 05/14/2018] traMADol (ULTRAM) 50 MG tablet, Take 1 tablet (50 mg total) by mouth every 12 (twelve) hours as needed for up to 30 days for severe pain. Month last 30 days., Disp: 60 tablet, Rfl: 0 .  gabapentin (NEURONTIN) 300 MG capsule, Take 1 capsule (300 mg total) by mouth 3 (three) times daily for 30 days. Follow written titration schedule., Disp: 90 capsule, Rfl: 0  ROS  Constitutional: Denies any fever or chills Gastrointestinal: No reported hemesis, hematochezia, vomiting, or acute GI distress Musculoskeletal: Denies any acute onset joint swelling, redness, loss of ROM, or weakness Neurological: No reported episodes of acute onset apraxia, aphasia, dysarthria, agnosia, amnesia, paralysis, loss of coordination, or loss of consciousness  Allergies  Ms. Orban is allergic to depakote [divalproex sodium] and keppra [levetiracetam].  [place]  Drug: Ms. Corney  reports no history of drug use. Alcohol:  reports previous alcohol use. Tobacco:  reports that she has never smoked. She has never used smokeless tobacco. Medical:  has a past medical history of Gastric bypass status for obesity, H/O unstable angina,  Hypoglycemia, MI (myocardial infarction) ([place]), Orthostatic hypotension, Osteoporosis, Sciatic nerve disease, and Thyroid disease. Surgical: Ms. Bermingham  has a past surgical history that includes Gastric bypass; Abdominal hysterectomy; Cholecystectomy; Tonsillectomy; Gastric bypass open; Oophorectomy; Hernia repair; Abdominal adhesion surgery; Gastrostomy w/ feeding tube; Dilation and curettage of uterus; and Splenectomy. Family: family history includes Asthma in her sister; COPD in her mother; Cancer in her mother; Epilepsy in her brother; Hypertension in her mother; Psoriasis in her sister.  Constitutional Exam  General appearance: Well nourished, well developed, and well hydrated. In no apparent acute distress Vitals:   05/12/18 1324  BP: 126/76  Pulse: 65  Resp: 16  Temp: 98.5 F (36.9 C)  TempSrc: Oral  SpO2: 100%  Weight: 175 lb (79.4 kg)  Height: _0  (1.575 m)   BMI Assessment: Estimated body mass index is 32.01 kg/m as calculated from the following:   Height as of this encounter: _1  (1.575 m).   Weight as of this encounter: 175 lb (79.4 kg).  BMI interpretation table: BMI level Category Range association with higher incidence of chronic pain  <18 kg/m2 Underweight   18.5-24.9 kg/m2 Ideal body weight   25-29.9 kg/m2 Overweight Increased incidence by 20%  30-34.9 kg/m2 Obese (Class I) Increased incidence by 68%  35-39.9 kg/m2 Severe obesity (Class II) Increased incidence by 136%  >40 kg/m2 Extreme obesity (Class III) Increased incidence by 254%   Patient's current BMI Ideal Body weight  Body mass index is 32.01 kg/m. Ideal body weight: 50.1 kg (110 lb 7.2 oz) Adjusted ideal body weight: 61.8 kg (136 lb 4.3 oz)   BMI Readings from Last 4 Encounters:  05/12/18 32.01 kg/m  04/27/18 32.01 kg/m  04/14/18 32.01 kg/m  03/31/18 31.28 kg/m   Wt Readings from Last 4 Encounters:  05/12/18 175 lb (79.4 kg)  04/27/18 175 lb (79.4 kg)  04/14/18 175 lb (79.4 kg)   03/31/18 171 lb (77.6 kg)  Psych/Mental status: Alert, oriented x 3 (person, place, & time)       Eyes: PERLA Respiratory: No evidence of acute respiratory distress  Cervical Spine Area Exam  Skin & Axial Inspection: No masses, redness, edema, swelling, or  associated skin lesions Alignment: Symmetrical Functional ROM: Unrestricted ROM      Stability: No instability detected Muscle Tone/Strength: Functionally intact. No obvious neuro-muscular anomalies detected. Sensory (Neurological): Unimpaired Palpation: No palpable anomalies              Upper Extremity (UE) Exam    Side: Right upper extremity  Side: Left upper extremity  Skin & Extremity Inspection: Skin color, temperature, and hair growth are WNL. No peripheral edema or cyanosis. No masses, redness, swelling, asymmetry, or associated skin lesions. No contractures.  Skin & Extremity Inspection: Skin color, temperature, and hair growth are WNL. No peripheral edema or cyanosis. No masses, redness, swelling, asymmetry, or associated skin lesions. No contractures.  Functional ROM: Unrestricted ROM          Functional ROM: Unrestricted ROM          Muscle Tone/Strength: Functionally intact. No obvious neuro-muscular anomalies detected.  Muscle Tone/Strength: Functionally intact. No obvious neuro-muscular anomalies detected.  Sensory (Neurological): Unimpaired          Sensory (Neurological): Unimpaired          Palpation: No palpable anomalies              Palpation: No palpable anomalies              Provocative Test(s):  Phalen's test: deferred Tinel's test: deferred Apley's scratch test (touch opposite shoulder):  Action 1 (Across chest): deferred Action 2 (Overhead): deferred Action 3 (LB reach): deferred   Provocative Test(s):  Phalen's test: deferred Tinel's test: deferred Apley's scratch test (touch opposite shoulder):  Action 1 (Across chest): deferred Action 2 (Overhead): deferred Action 3 (LB reach): deferred     Thoracic Spine Area Exam  Skin & Axial Inspection: No masses, redness, or swelling Alignment: Symmetrical Functional ROM: Unrestricted ROM Stability: No instability detected Muscle Tone/Strength: Functionally intact. No obvious neuro-muscular anomalies detected. Sensory (Neurological): Unimpaired Muscle strength & Tone: No palpable anomalies  Lumbar Spine Area Exam  Skin & Axial Inspection: No masses, redness, or swelling Alignment: Symmetrical Functional ROM: Unrestricted ROM       Stability: No instability detected Muscle Tone/Strength: Functionally intact. No obvious neuro-muscular anomalies detected. Sensory (Neurological): Unimpaired Palpation: No palpable anomalies       Provocative Tests: Hyperextension/rotation test: deferred today       Lumbar quadrant test (Kemp's test): deferred today       Lateral bending test: deferred today       Patrick's Maneuver: deferred today                   FABER* test: deferred today                   S-I anterior distraction/compression test: deferred today         S-I lateral compression test: deferred today         S-I Thigh-thrust test: deferred today         S-I Gaenslen's test: deferred today         *(Flexion, ABduction and External Rotation)  Gait & Posture Assessment  Ambulation: Unassisted Gait: Relatively normal for age and body habitus Posture: WNL   Lower Extremity Exam    Side: Right lower extremity  Side: Left lower extremity  Stability: No instability observed          Stability: No instability observed          Skin & Extremity Inspection: Skin color, temperature, and hair growth are  WNL. No peripheral edema or cyanosis. No masses, redness, swelling, asymmetry, or associated skin lesions. No contractures.  Skin & Extremity Inspection: Skin color, temperature, and hair growth are WNL. No peripheral edema or cyanosis. No masses, redness, swelling, asymmetry, or associated skin lesions. No contractures.  Functional ROM:  Unrestricted ROM                  Functional ROM: Unrestricted ROM                  Muscle Tone/Strength: Functionally intact. No obvious neuro-muscular anomalies detected.  Muscle Tone/Strength: Functionally intact. No obvious neuro-muscular anomalies detected.  Sensory (Neurological): Unimpaired        Sensory (Neurological): Unimpaired        DTR: Patellar: deferred today Achilles: deferred today Plantar: deferred today  DTR: Patellar: deferred today Achilles: deferred today Plantar: deferred today  Palpation: No palpable anomalies  Palpation: No palpable anomalies   Assessment   Status Diagnosis  Controlled Controlled Controlled 1. Chronic low back pain (Bilateral) (L>R) w/o sciatica   2. Chronic lower extremity pain (Secondary Area of Pain) (Bilateral) (L>R)   3. Lumbar facet syndrome (Bilateral) (L>R)   4. Chronic neck pain (Tertiary Area of Pain) (Bilateral) (L>R)   5. Occipital headache (Fourth Area of Pain) (Bilateral) (L>R)   6. Chronic low back pain (Primary Area of Pain) (Bilateral) (L>R) w/ sciatica   7. Chronic musculoskeletal pain   8. Chronic pain syndrome   9. Neurogenic pain      Updated Problems: No problems updated.  Plan of Care  Pharmacotherapy (Medications Ordered): Meds ordered this encounter  Medications  . tiZANidine (ZANAFLEX) 4 MG tablet    Sig: Take 1 tablet (4 mg total) by mouth 3 (three) times daily for 30 days.    Dispense:  90 tablet    Refill:  0    Do not place medication on "Automatic Refill". Fill one day early if pharmacy is closed on scheduled refill date.  . traMADol (ULTRAM) 50 MG tablet    Sig: Take 1 tablet (50 mg total) by mouth every 12 (twelve) hours as needed for up to 30 days for severe pain. Month last 30 days.    Dispense:  60 tablet    Refill:  0    [place] STOP ACT - Not applicable to Chronic Pain Syndrome (G89.4) diagnosis. Fill one day early if pharmacy is closed on scheduled refill date. Do not fill until: 05/14/18. To  last until: 06/13/18.  Marland Kitchen gabapentin (NEURONTIN) 100 MG capsule    Sig: Take 1-3 capsules (100-300 mg total) by mouth 4 (four) times daily for 30 days. Follow written titration schedule.    Dispense:  360 capsule    Refill:  0  . gabapentin (NEURONTIN) 300 MG capsule    Sig: Take 1 capsule (300 mg total) by mouth 3 (three) times daily for 30 days. Follow written titration schedule.    Dispense:  90 capsule    Refill:  0    Do not place this medication, or any other prescription from our practice, on "Automatic Refill". Patient may have prescription filled one day early if pharmacy is closed on scheduled refill date.   Medications administered today: Myya Meenach had no medications administered during this visit.  Orders:  Orders Placed This Encounter  Procedures  . LUMBAR FACET(MEDIAL BRANCH NERVE BLOCK) MBNB    Standing Status:   Future    Standing Expiration Date:   06/12/2018  Scheduling Instructions:     Side: Bilateral     Level: L3-4, L4-5, & L5-S1 Facets (L2, L3, L4, L5, & S1 Medial Branch Nerves)     Sedation: Patient's choice.     Timeframe: ASAA    Order Specific Question:   Where will this procedure be performed?    Answer:   [facility]   Lab Orders  No laboratory test(s) ordered today   Imaging Orders  No imaging studies ordered today   Referral Orders  No referral(s) requested today   Interventional management options: Planned follow-up:   Diagnostic bilateral lumbar facet block #2 under fluoroscopic guidance and IV sedation Plan: Return for Procedure (no sedation): (B) L-FCT BLK #2.   Considering:   Diagnostic midline lumbar ESI  Diagnostic bilateral lumbar facet block  Possible bilateral lumbar facet RFA  Diagnostic bilateral occipital nerve block  Possible bilateral occipital nerve RFA  Diagnostic bilateral cervical facet nerve block Possible bilateral lumbar facet RFA  Diagnostic left-sided sacroiliac joint injection  Possible left-sided  sacroiliac joint RFA    Palliative PRN treatment(s):   None at this time   No future appointments. Primary Care Physician: Danelle Berry, NP Location: [facility] Note by: Gaspar Cola, MD Date: 05/12/2018; Time: 2:18 PM

## 2018-05-12 ENCOUNTER — Ambulatory Visit: Payer: BLUE CROSS/BLUE SHIELD | Attending: Pain Medicine | Admitting: Pain Medicine

## 2018-05-12 ENCOUNTER — Encounter: Payer: Self-pay | Admitting: Pain Medicine

## 2018-05-12 VITALS — BP 126/76 | HR 65 | Temp 98.5°F | Resp 16 | Ht 62.0 in | Wt 175.0 lb

## 2018-05-12 DIAGNOSIS — M79605 Pain in left leg: Secondary | ICD-10-CM

## 2018-05-12 DIAGNOSIS — G8929 Other chronic pain: Secondary | ICD-10-CM

## 2018-05-12 DIAGNOSIS — M5442 Lumbago with sciatica, left side: Secondary | ICD-10-CM | POA: Diagnosis present

## 2018-05-12 DIAGNOSIS — R51 Headache: Secondary | ICD-10-CM

## 2018-05-12 DIAGNOSIS — M792 Neuralgia and neuritis, unspecified: Secondary | ICD-10-CM | POA: Diagnosis present

## 2018-05-12 DIAGNOSIS — M545 Low back pain: Secondary | ICD-10-CM

## 2018-05-12 DIAGNOSIS — M542 Cervicalgia: Secondary | ICD-10-CM

## 2018-05-12 DIAGNOSIS — M79604 Pain in right leg: Secondary | ICD-10-CM | POA: Diagnosis not present

## 2018-05-12 DIAGNOSIS — M7918 Myalgia, other site: Secondary | ICD-10-CM

## 2018-05-12 DIAGNOSIS — M5441 Lumbago with sciatica, right side: Secondary | ICD-10-CM

## 2018-05-12 DIAGNOSIS — R519 Headache, unspecified: Secondary | ICD-10-CM

## 2018-05-12 DIAGNOSIS — G894 Chronic pain syndrome: Secondary | ICD-10-CM | POA: Diagnosis present

## 2018-05-12 DIAGNOSIS — M47816 Spondylosis without myelopathy or radiculopathy, lumbar region: Secondary | ICD-10-CM

## 2018-05-12 MED ORDER — TIZANIDINE HCL 4 MG PO TABS: 4.0000 mg | ORAL_TABLET | Freq: Three times a day (TID) | ORAL | 0 refills | Status: DC

## 2018-05-12 MED ORDER — GABAPENTIN 300 MG PO CAPS: 300.0000 mg | ORAL_CAPSULE | Freq: Three times a day (TID) | ORAL | 0 refills | Status: DC

## 2018-05-12 MED ORDER — TRAMADOL HCL 50 MG PO TABS: 50.0000 mg | ORAL_TABLET | Freq: Two times a day (BID) | ORAL | 0 refills | Status: DC | PRN

## 2018-05-12 MED ORDER — GABAPENTIN 100 MG PO CAPS: 100.0000 mg | ORAL_CAPSULE | Freq: Four times a day (QID) | ORAL | 0 refills | Status: DC

## 2018-05-12 NOTE — Progress Notes (Signed)
Nursing Pain Medication Assessment:  Safety precautions to be maintained throughout the outpatient stay will include: orient to surroundings, keep bed in low position, maintain call bell within reach at all times, provide assistance with transfer out of bed and ambulation.  Medication Inspection Compliance: Pill count conducted under aseptic conditions, in front of the patient. Neither the pills nor the bottle was removed from the patient's sight at any time. Once count was completed pills were immediately returned to the patient in their original bottle.  Medication: See above Pill/Patch Count: 8 of 60 pills remain Pill/Patch Appearance: Markings consistent with prescribed medication Bottle Appearance: Standard pharmacy container. Clearly labeled. Filled Date: 02 / 06 / 2020 Last Medication intake:  Today

## 2018-05-12 NOTE — Patient Instructions (Addendum)
____________________________________________________________________________________________  Preparing for Procedure with Sedation  Instructions: . Oral Intake: Do not eat or drink anything for at least 8 hours prior to your procedure. . Transportation: Public transportation is not allowed. Bring an adult driver. The driver must be physically present in our waiting room before any procedure can be started. . Physical Assistance: Bring an adult physically capable of assisting you, in the event you need help. This adult should keep you company at home for at least 6 hours after the procedure. . Blood Pressure Medicine: Take your blood pressure medicine with a sip of water the morning of the procedure. . Blood thinners: Notify our staff if you are taking any blood thinners. Depending on which one you take, there will be specific instructions on how and when to stop it. . Diabetics on insulin: Notify the staff so that you can be scheduled 1st case in the morning. If your diabetes requires high dose insulin, take only  of your normal insulin dose the morning of the procedure and notify the staff that you have done so. . Preventing infections: Shower with an antibacterial soap the morning of your procedure. . Build-up your immune system: Take 1000 mg of Vitamin C with every meal (3 times a day) the day prior to your procedure. . Antibiotics: Inform the staff if you have a condition or reason that requires you to take antibiotics before dental procedures. . Pregnancy: If you are pregnant, call and cancel the procedure. . Sickness: If you have a cold, fever, or any active infections, call and cancel the procedure. . Arrival: You must be in the facility at least 30 minutes prior to your scheduled procedure. . Children: Do not bring children with you. . Dress appropriately: Bring dark clothing that you would not mind if they get stained. . Valuables: Do not bring any jewelry or valuables.  Procedure  appointments are reserved for interventional treatments only. . No Prescription Refills. . No medication changes will be discussed during procedure appointments. . No disability issues will be discussed.  Reasons to call and reschedule or cancel your procedure: (Following these recommendations will minimize the risk of a serious complication.) . Surgeries: Avoid having procedures within 2 weeks of any surgery. (Avoid for 2 weeks before or after any surgery). . Flu Shots: Avoid having procedures within 2 weeks of a flu shots or . (Avoid for 2 weeks before or after immunizations). . Barium: Avoid having a procedure within 7-10 days after having had a radiological study involving the use of radiological contrast. (Myelograms, Barium swallow or enema study). . Heart attacks: Avoid any elective procedures or surgeries for the initial 6 months after a "Myocardial Infarction" (Heart Attack). . Blood thinners: It is imperative that you stop these medications before procedures. Let us know if you if you take any blood thinner.  . Infection: Avoid procedures during or within two weeks of an infection (including chest colds or gastrointestinal problems). Symptoms associated with infections include: Localized redness, fever, chills, night sweats or profuse sweating, burning sensation when voiding, cough, congestion, stuffiness, runny nose, sore throat, diarrhea, nausea, vomiting, cold or Flu symptoms, recent or current infections. It is specially important if the infection is over the area that we intend to treat. . Heart and lung problems: Symptoms that may suggest an active cardiopulmonary problem include: cough, chest pain, breathing difficulties or shortness of breath, dizziness, ankle swelling, uncontrolled high or unusually low blood pressure, and/or palpitations. If you are experiencing any of these symptoms, cancel   your procedure and contact your primary care physician for an evaluation.  Remember:   Regular Business hours are:  Monday to Thursday 8:00 AM to 4:00 PM  Provider's Schedule: Delano Metz, MD:  Procedure days: Tuesday and Thursday 7:30 AM to 4:00 PM  Edward Jolly, MD:  Procedure days: Monday and Wednesday 7:30 AM to 4:00 PM ____________________________________________________________________________________________ Prescriptions for Tramadol, Gabapentin, and Tizanidine were sent to your pharmacy.

## 2018-05-31 ENCOUNTER — Encounter: Payer: Self-pay | Admitting: Nurse Practitioner

## 2018-05-31 ENCOUNTER — Ambulatory Visit: Payer: BLUE CROSS/BLUE SHIELD | Attending: Nurse Practitioner | Admitting: Nurse Practitioner

## 2018-05-31 ENCOUNTER — Other Ambulatory Visit: Payer: Self-pay

## 2018-05-31 VITALS — BP 123/53 | HR 81 | Temp 97.9°F | Resp 16 | Ht 62.0 in | Wt 175.0 lb

## 2018-05-31 DIAGNOSIS — M533 Sacrococcygeal disorders, not elsewhere classified: Secondary | ICD-10-CM | POA: Diagnosis present

## 2018-05-31 DIAGNOSIS — M792 Neuralgia and neuritis, unspecified: Secondary | ICD-10-CM | POA: Insufficient documentation

## 2018-05-31 DIAGNOSIS — M7918 Myalgia, other site: Secondary | ICD-10-CM | POA: Insufficient documentation

## 2018-05-31 DIAGNOSIS — G894 Chronic pain syndrome: Secondary | ICD-10-CM | POA: Diagnosis present

## 2018-05-31 DIAGNOSIS — M170 Bilateral primary osteoarthritis of knee: Secondary | ICD-10-CM | POA: Insufficient documentation

## 2018-05-31 DIAGNOSIS — M47816 Spondylosis without myelopathy or radiculopathy, lumbar region: Secondary | ICD-10-CM | POA: Insufficient documentation

## 2018-05-31 DIAGNOSIS — M5441 Lumbago with sciatica, right side: Secondary | ICD-10-CM | POA: Insufficient documentation

## 2018-05-31 DIAGNOSIS — G8929 Other chronic pain: Secondary | ICD-10-CM | POA: Diagnosis present

## 2018-05-31 DIAGNOSIS — M5442 Lumbago with sciatica, left side: Secondary | ICD-10-CM | POA: Insufficient documentation

## 2018-05-31 MED ORDER — TIZANIDINE HCL 4 MG PO TABS: 4.0000 mg | ORAL_TABLET | Freq: Three times a day (TID) | ORAL | 2 refills | Status: DC

## 2018-05-31 MED ORDER — GABAPENTIN 300 MG PO CAPS: 300.0000 mg | ORAL_CAPSULE | Freq: Four times a day (QID) | ORAL | 2 refills | Status: DC

## 2018-05-31 MED ORDER — TRAMADOL HCL 50 MG PO TABS: 50.0000 mg | ORAL_TABLET | Freq: Two times a day (BID) | ORAL | 0 refills | Status: DC | PRN

## 2018-05-31 NOTE — Patient Instructions (Addendum)
____________________________________________________________________________________________  Medication Rules  Purpose: To inform patients, and their family members, of our rules and regulations.  Applies to: All patients receiving prescriptions (written or electronic).  Pharmacy of record: Pharmacy where electronic prescriptions will be sent. If written prescriptions are taken to a different pharmacy, please inform the nursing staff. The pharmacy listed in the electronic medical record should be the one where you would like electronic prescriptions to be sent.  Electronic prescriptions: In compliance with the [place] Strengthen Opioid Misuse Prevention (STOP) Act of 2017 (Session Law 2017-74/H243), effective March 10, 2018, all controlled substances must be electronically prescribed. Calling prescriptions to the pharmacy will cease to exist.  Prescription refills: Only during scheduled appointments. Applies to all prescriptions.  NOTE: The following applies primarily to controlled substances (Opioid* Pain Medications).   Patient's responsibilities: 1. Pain Pills: Bring all pain pills to every appointment (except for procedure appointments). 2. Pill Bottles: Bring pills in original pharmacy bottle. Always bring the newest bottle. Bring bottle, even if empty. 3. Medication refills: You are responsible for knowing and keeping track of what medications you take and those you need refilled. The day before your appointment: write a list of all prescriptions that need to be refilled. The day of the appointment: give the list to the admitting nurse. Prescriptions will be written only during appointments. No prescriptions will be written on procedure days. If you forget a medication: it will not be "Called in", "Faxed", or "electronically sent". You will need to get another appointment to get these prescribed. No early refills. Do not call asking to have your prescription filled  early. 4. Prescription Accuracy: You are responsible for carefully inspecting your prescriptions before leaving our office. Have the discharge nurse carefully go over each prescription with you, before taking them home. Make sure that your name is accurately spelled, that your address is correct. Check the name and dose of your medication to make sure it is accurate. Check the number of pills, and the written instructions to make sure they are clear and accurate. Make sure that you are given enough medication to last until your next medication refill appointment. 5. Taking Medication: Take medication as prescribed. When it comes to controlled substances, taking less pills or less frequently than prescribed is permitted and encouraged. Never take more pills than instructed. Never take medication more frequently than prescribed.  6. Inform other Doctors: Always inform, all of your healthcare providers, of all the medications you take. 7. Pain Medication from other Providers: You are not allowed to accept any additional pain medication from any other Doctor or Healthcare provider. There are two exceptions to this rule. (see below) In the event that you require additional pain medication, you are responsible for notifying us, as stated below. 8. Medication Agreement: You are responsible for carefully reading and following our Medication Agreement. This must be signed before receiving any prescriptions from our practice. Safely store a copy of your signed Agreement. Violations to the Agreement will result in no further prescriptions. (Additional copies of our Medication Agreement are available upon request.) 9. Laws, Rules, & Regulations: All patients are expected to follow all Federal and State Laws, Statutes, Rules, & Regulations. Ignorance of the Laws does not constitute a valid excuse. The use of any illegal substances is prohibited. 10. Adopted CDC guidelines & recommendations: Target dosing levels will be  at or below 60 MME/day. Use of benzodiazepines** is not recommended.  Exceptions: There are only two exceptions to the rule of not   receiving pain medications from other Healthcare Providers. 1. Exception #1 (Emergencies): In the event of an emergency (i.e.: accident requiring emergency care), you are allowed to receive additional pain medication. However, you are responsible for: As soon as you are able, call our office [phone], at any time of the day or night, and leave a message stating your name, the date and nature of the emergency, and the name and dose of the medication prescribed. In the event that your call is answered by a member of our staff, make sure to document and save the date, time, and the name of the person that took your information.  2. Exception #2 (Planned Surgery): In the event that you are scheduled by another doctor or dentist to have any type of surgery or procedure, you are allowed (for a period no longer than 30 days), to receive additional pain medication, for the acute post-op pain. However, in this case, you are responsible for picking up a copy of our "Post-op Pain Management for Surgeons" handout, and giving it to your surgeon or dentist. This document is available at our office, and does not require an appointment to obtain it. Simply go to our office during business hours (Monday-Thursday from 8:00 AM to 4:00 PM) (Friday 8:00 AM to 12:00 Noon) or if you have a scheduled appointment with [place], prior to your surgery, and ask for it by name. In addition, you will need to provide [place] with your name, name of your surgeon, type of surgery, and date of procedure or surgery.  *Opioid medications include: morphine, codeine, oxycodone, oxymorphone, hydrocodone, hydromorphone, meperidine, tramadol, tapentadol, buprenorphine, fentanyl, methadone. **Benzodiazepine medications include: diazepam (Valium), alprazolam (Xanax), clonazepam (Klonopine), lorazepam (Ativan), clorazepate  (Tranxene), chlordiazepoxide (Librium), estazolam (Prosom), oxazepam (Serax), temazepam (Restoril), triazolam (Halcion) (Last updated: 05/07/2017) ____________________________________________________________________________________________    ______________________________________________________________________________________________  Specialty Pain Scale  Introduction:  There are significant differences in how pain is reported. The word pain usually refers to physical pain, but it is also a common synonym of suffering. The medical community uses a scale from 0 (zero) to 10 (ten) to report pain level. Zero (0) is described as "no pain", while ten (10) is described as "the worse pain you can imagine". The problem with this scale is that physical pain is reported along with suffering. Suffering refers to mental pain, or more often yet it refers to any unpleasant feeling, emotion or aversion associated with the perception of harm or threat of harm. It is the psychological component of pain.  Pain Specialists prefer to separate the two components. The pain scale used by this practice is the Verbal Numerical Rating Scale (VNRS-11). This scale is for the physical pain only. DO NOT INCLUDE how your pain psychologically affects you. This scale is for adults 80 years of age and older. It has 11 (eleven) levels. The 1st level is 0/10. This means: "right now, I have no pain". In the context of pain management, it also means: "right now, my physical pain is under control with the current therapy".  General Information:  The scale should reflect your current level of pain. Unless you are specifically asked for the level of your worst pain, or your average pain. If you are asked for one of these two, then it should be understood that it is over the past 24 hours.  Levels 1 (one) through 5 (five) are described below, and can be treated as an outpatient. Ambulatory pain management facilities such as ours are more  than adequate  to treat these levels. Levels 6 (six) through 10 (ten) are also described below, however, these must be treated as a hospitalized patient. While levels 6 (six) and 7 (seven) may be evaluated at an urgent care facility, levels 8 (eight) through 10 (ten) constitute medical emergencies and as such, they belong in a hospital's emergency department. When having these levels (as described below), do not come to our office. Our facility is not equipped to manage these levels. Go directly to an urgent care facility or an emergency department to be evaluated.  Definitions:  Activities of Daily Living (ADL): Activities of daily living (ADL or ADLs) is a term used in healthcare to refer to people's daily self-care activities. Health professionals often use a person's ability or inability to perform ADLs as a measurement of their functional status, particularly in regard to people post injury, with disabilities and the elderly. There are two ADL levels: Basic and Instrumental. Basic Activities of Daily Living (BADL  or BADLs) consist of self-care tasks that include: Bathing and showering; personal hygiene and grooming (including brushing/combing/styling hair); dressing; Toilet hygiene (getting to the toilet, cleaning oneself, and getting back up); eating and self-feeding (not including cooking or chewing and swallowing); functional mobility, often referred to as "transferring", as measured by the ability to walk, get in and out of bed, and get into and out of a chair; the broader definition (moving from one place to another while performing activities) is useful for people with different physical abilities who are still able to get around independently. Basic ADLs include the things many people do when they get up in the morning and get ready to go out of the house: get out of bed, go to the toilet, bathe, dress, groom, and eat. On the average, loss of function typically follows a particular order. Hygiene  is the first to go, followed by loss of toilet use and locomotion. The last to go is the ability to eat. When there is only one remaining area in which the person is independent, there is a 62.9% chance that it is eating and only a 3.5% chance that it is hygiene. Instrumental Activities of Daily Living (IADL or IADLs) are not necessary for fundamental functioning, but they let an individual live independently in a community. IADL consist of tasks that include: cleaning and maintaining the house; home establishment and maintenance; care of others (including selecting and supervising caregivers); care of pets; child rearing; managing money; managing financials (investments, etc.); meal preparation and cleanup; shopping for groceries and necessities; moving within the community; safety procedures and emergency responses; health management and maintenance (taking prescribed medications); and using the telephone or other form of communication.  Instructions:  Most patients tend to report their pain as a combination of two factors, their physical pain and their psychosocial pain. This last one is also known as "suffering" and it is reflection of how physical pain affects you socially and psychologically. From now on, report them separately.  From this point on, when asked to report your pain level, report only your physical pain. Use the following table for reference.  Pain Clinic Pain Levels (0-5/10)  Pain Level Score  Description  No Pain 0   Mild pain 1 Nagging, annoying, but does not interfere with basic activities of daily living (ADL). Patients are able to eat, bathe, get dressed, toileting (being able to get on and off the toilet and perform personal hygiene functions), transfer (move in and out of bed or a chair without   assistance), and maintain continence (able to control bladder and bowel functions). Blood pressure and heart rate are unaffected. A normal heart rate for a healthy adult ranges from 60 to  100 bpm (beats per minute).   Mild to moderate pain 2 Noticeable and distracting. Impossible to hide from other people. More frequent flare-ups. Still possible to adapt and function close to normal. It can be very annoying and may have occasional stronger flare-ups. With discipline, patients may get used to it and adapt.   Moderate pain 3 Interferes significantly with activities of daily living (ADL). It becomes difficult to feed, bathe, get dressed, get on and off the toilet or to perform personal hygiene functions. Difficult to get in and out of bed or a chair without assistance. Very distracting. With effort, it can be ignored when deeply involved in activities.   Moderately severe pain 4 Impossible to ignore for more than a few minutes. With effort, patients may still be able to manage work or participate in some social activities. Very difficult to concentrate. Signs of autonomic nervous system discharge are evident: dilated pupils (mydriasis); mild sweating (diaphoresis); sleep interference. Heart rate becomes elevated (>115 bpm). Diastolic blood pressure (lower number) rises above 100 mmHg. Patients find relief in laying down and not moving.   Severe pain 5 Intense and extremely unpleasant. Associated with frowning face and frequent crying. Pain overwhelms the senses.  Ability to do any activity or maintain social relationships becomes significantly limited. Conversation becomes difficult. Pacing back and forth is common, as getting into a comfortable position is nearly impossible. Pain wakes you up from deep sleep. Physical signs will be obvious: pupillary dilation; increased sweating; goosebumps; brisk reflexes; cold, clammy hands and feet; nausea, vomiting or dry heaves; loss of appetite; significant sleep disturbance with inability to fall asleep or to remain asleep. When persistent, significant weight loss is observed due to the complete loss of appetite and sleep deprivation.  Blood pressure  and heart rate becomes significantly elevated. Caution: If elevated blood pressure triggers a pounding headache associated with blurred vision, then the patient should immediately seek attention at an urgent or emergency care unit, as these may be signs of an impending stroke.    Emergency Department Pain Levels (6-10/10)  Emergency Room Pain 6 Severely limiting. Requires emergency care and should not be seen or managed at an outpatient pain management facility. Communication becomes difficult and requires great effort. Assistance to reach the emergency department may be required. Facial flushing and profuse sweating along with potentially dangerous increases in heart rate and blood pressure will be evident.   Distressing pain 7 Self-care is very difficult. Assistance is required to transport, or use restroom. Assistance to reach the emergency department will be required. Tasks requiring coordination, such as bathing and getting dressed become very difficult.   Disabling pain 8 Self-care is no longer possible. At this level, pain is disabling. The individual is unable to do even the most "basic" activities such as walking, eating, bathing, dressing, transferring to a bed, or toileting. Fine motor skills are lost. It is difficult to think clearly.   Incapacitating pain 9 Pain becomes incapacitating. Thought processing is no longer possible. Difficult to remember your own name. Control of movement and coordination are lost.   The worst pain imaginable 10 At this level, most patients pass out from pain. When this level is reached, collapse of the autonomic nervous system occurs, leading to a sudden drop in blood pressure and heart rate. This in turn   results in a temporary and dramatic drop in blood flow to the brain, leading to a loss of consciousness. Fainting is one of the body's self defense mechanisms. Passing out puts the brain in a calmed state and causes it to shut down for a while, in order to  begin the healing process.    Summary: 1.   Refer to this scale when providing [place] with your pain level. 2.   Be accurate and careful when reporting your pain level. This will help with your care. 3.   Over-reporting your pain level will lead to loss of credibility. 4.   Even a level of 1/10 means that there is pain and will be treated at our facility. 5.   High, inaccurate reporting will be documented as "Symptom Exaggeration", leading to loss of credibility and suspicions of possible secondary gains such as obtaining more narcotics, or wanting to appear disabled, for fraudulent reasons. 6.   Only pain levels of 5 or below will be seen at our facility. 7.   Pain levels of 6 and above will be sent to the Emergency Department and the appointment cancelled.  ______________________________________________________________________________________________    Preparing for Procedure with Sedation Instructions: . Oral Intake: Do not eat or drink anything for at least 8 hours prior to your procedure. . Transportation: Public transportation is not allowed. Bring an adult driver. The driver must be physically present in our waiting room before any procedure can be started. Marland Kitchen Physical Assistance: Bring an adult capable of physically assisting you, in the event you need help. . Blood Pressure Medicine: Take your blood pressure medicine with a sip of water the morning of the procedure. . Insulin: Take only  of your normal insulin dose. . Preventing infections: Shower with an antibacterial soap the morning of your procedure. . Build-up your immune system: Take 1000 mg of Vitamin C with every meal (3 times a day) the day prior to your procedure. . Pregnancy: If you are pregnant, call and cancel the procedure. . Sickness: If you have a cold, fever, or any active infections, call and cancel the procedure. . Arrival: You must be in the facility at least 30 minutes prior to your scheduled  procedure. . Children: Do not bring children with you. . Dress appropriately: Bring dark clothing that you would not mind if they get stained. . Valuables: Do not bring any jewelry or valuables. Procedure appointments are reserved for interventional treatments only. Marland Kitchen No Prescription Refills. . No medication changes will be discussed during procedure appointments. . No disability issues will be discussed.

## 2018-05-31 NOTE — Progress Notes (Signed)
Nursing Pain Medication Assessment:  Safety precautions to be maintained throughout the outpatient stay will include: orient to surroundings, keep bed in low position, maintain call bell within reach at all times, provide assistance with transfer out of bed and ambulation.  Medication Inspection Compliance: Pill count conducted under aseptic conditions, in front of the patient. Neither the pills nor the bottle was removed from the patient's sight at any time. Once count was completed pills were immediately returned to the patient in their original bottle.  Medication: Tramadol (Ultram) Pill/Patch Count: 30 of 60 pills remain Pill/Patch Appearance: Markings consistent with prescribed medication Bottle Appearance: Standard pharmacy container. Clearly labeled. Filled Date:03 /06/ 2020 Last Medication intake:  Today

## 2018-05-31 NOTE — Progress Notes (Signed)
Patient's Name: Diana Quinn  MRN: [id number]  Referring Provider: Fayrene Helper, NP  DOB: 1965/08/26  PCP: Fayrene Helper, NP  DOS: 05/31/2018  Note by: Thad Ranger, NP  Service setting: Ambulatory outpatient  Specialty: Interventional Pain Management  Location: [facility] (AMB) Pain Management Facility    Patient type: Established   HPI  Reason for Visit: Diana Quinn is a 53 y.o. year old, female patient, who comes today with a chief complaint of Back Pain (low) Last Appointment: Her last appointment at our practice was on 05/12/2018. I last saw her on 03/31/2018.  Pain Assessment: Today, Diana Quinn describes the severity of the Chronic pain as a 7 /10. She indicates the location/referral of the pain to be Back Lower/radiates down left hip and into left leg to the calf in the back. Onset was: More than a month ago. The quality of pain is described as Aching, Constant, Dull, Pressure, Sore, Tender. Temporal description, or timing of pain is: Constant. Possible modifying factors: meds, heat. Diana Quinn  height is  (1.575 m) and weight is 175 lb (79.4 kg). Her temperature is 97.9 F (36.6 C). Her blood pressure is 123/53 (abnormal) and her pulse is 81. Her respiration is 16 and oxygen saturation is 100%.  She states that her pain continues because she has not been able to have her lumbar facet repeated. She felt like the Lumbar facet nerve block was effective for the a few days but then the pain returned back with the same intensity. She did have PT in 02/2018 however she did discontinue this secondary to her back pain got worse. She admits that the leg pain has also increased.  She does not feel like the Tramadol is effective for her pain management. She denies any side effects of the Tramadol. She feels like the Tizanidine is more effective than the Tramadol. She feels like after one hour after with taking the Tramadol, she has to take the Motrin and this offers her the same benefit as the  Tramadol. She does not feel like it is as effective as is should be.   Controlled Substance Pharmacotherapy Assessment REMS (Risk Evaluation and Mitigation Strategy)  Analgesic: Tramadol 50 mg BID  MME/day: 10 mg/day.  Diana Salts, RN  05/31/2018 10:34 AM  Signed Nursing Pain Medication Assessment:  Safety precautions to be maintained throughout the outpatient stay will include: orient to surroundings, keep bed in low position, maintain call bell within reach at all times, provide assistance with transfer out of bed and ambulation.  Medication Inspection Compliance: Pill count conducted under aseptic conditions, in front of the patient. Neither the pills nor the bottle was removed from the patient's sight at any time. Once count was completed pills were immediately returned to the patient in their original bottle.  Medication: Tramadol (Ultram) Pill/Patch Count: 30 of 60 pills remain Pill/Patch Appearance: Markings consistent with prescribed medication Bottle Appearance: Standard pharmacy container. Clearly labeled. Filled Date:03 /06/ 2020 Last Medication intake:  Today   Pharmacokinetics: Liberation and absorption (onset of action): Longer than expected Distribution (time to peak effect): Shorter than expected. Metabolism and excretion (duration of action): Shorter than expected.         Pharmacodynamics: Desired effects: Analgesia: Diana Quinn reports <50% benefit. Functional ability: Patient reports that medication does help, but not nearly as much as she would like Clinically meaningful improvement in function (CMIF): Medication does not meet basic CMIF Perceived effectiveness: Described as ineffective and would like to  make some changes Undesirable effects: Side-effects or Adverse reactions: None reported Monitoring: [place] PMP: Online review of the past 84-month period conducted. Compliant with practice rules and regulations Last UDS on record: Summary  Date Value Ref Range Status   03/31/2018 FINAL  Final    Comment:    ==================================================================== [facility] COMP DRUG ANALYSIS,UR ==================================================================== Test                             Result       Flag       Units Drug Present and Declared for Prescription Verification   7-aminoclonazepam              84           EXPECTED   ng/mg creat    7-aminoclonazepam is an expected metabolite of clonazepam. Source    of clonazepam is a scheduled prescription medication.   Butalbital                     PRESENT      EXPECTED   Gabapentin                     PRESENT      EXPECTED   Oxcarbazepine MHD              PRESENT      EXPECTED    Oxcarbazepine MHD is the active metabolite of oxcarbazepine and    eslicarbazepine.   Quetiapine                     PRESENT      EXPECTED   Aripiprazole                   PRESENT      EXPECTED   Acetaminophen                  PRESENT      EXPECTED Drug Present not Declared for Prescription Verification   Baclofen                       PRESENT      UNEXPECTED   Ibuprofen                      PRESENT      UNEXPECTED Drug Absent but Declared for Prescription Verification   Oxycodone                      Not Detected UNEXPECTED ng/mg creat   Tizanidine                     Not Detected UNEXPECTED    Tizanidine, as indicated in the declared medication list, is not    always detected even when used as directed.   Salicylate                     Not Detected UNEXPECTED    Aspirin, as indicated in the declared medication list, is not    always detected even when used as directed. ==================================================================== Test                      Result    Flag   Units      Ref Range   Creatinine  102              mg/dL      >=16 ==================================================================== Declared Medications:  The flagging and interpretation on this report are  based on the  following declared medications.  Unexpected results may arise from  inaccuracies in the declared medications.  **Note: The testing scope of this panel includes these medications:  Aripiprazole (Abilify)  Butalbital (Butalbital/APAP/Caffeine)  Clonazepam (Klonopin)  Gabapentin (Neurontin)  Oxcarbazepine (Trileptal)  Oxycodone (Percocet)  Quetiapine (Seroquel)  **Note: The testing scope of this panel does not include small to  moderate amounts of these reported medications:  Acetaminophen (Butalbital/APAP/Caffeine)  Acetaminophen (Percocet)  Aspirin  Tizanidine (Zanaflex)  **Note: The testing scope of this panel does not include following  reported medications:  Alendronate (Fosamax)  Caffeine (Butalbital/APAP/Caffeine)  Calcium (Calcium/Cholecalciferol)  Cholecalciferol (Calcium/Cholecalciferol)  Lactulose (Constulose)  Levothyroxine  Nitroglycerin (Nitrostat)  Ondansetron (Zofran)  Prednisone (Sterapred)  Rosuvastatin (Crestor)  Sumatriptan (Imitrex)  Vitamin B12  Vitamin D2 ==================================================================== For clinical consultation, please call [phone]. ====================================================================    UDS interpretation: Compliant          Medication Assessment Form: Reviewed. Patient indicates being compliant with therapy Treatment compliance: Compliant Risk Assessment Profile: Aberrant behavior: See initial evaluations. None observed or detected today Comorbid factors increasing risk of overdose: See initial evaluation. No additional risks detected today Opioid risk tool (ORT):  Opioid Risk  05/31/2018  Alcohol 0  Illegal Drugs 0  Rx Drugs 0  Alcohol 0  Illegal Drugs 0  Rx Drugs 0  Age between 16-45 years  0  History of Preadolescent Sexual Abuse 0  Psychological Disease 2  ADD Negative  OCD -  Bipolar -  Depression -  Opioid Risk Tool Scoring 2  Opioid Risk Interpretation Low  Risk    ORT Scoring interpretation table:  Score <3 = Low Risk for SUD  Score between 4-7 = Moderate Risk for SUD  Score >8 = High Risk for Opioid Abuse   Risk of substance use disorder (SUD): Low  Risk Mitigation Strategies:  Patient Counseling: Covered Patient-Prescriber Agreement (PPA): Present and active  Notification to other healthcare providers: Done  Pharmacologic Plan: No change in therapy, at this time.             ROS  Constitutional: Denies any fever or chills Gastrointestinal: No reported hemesis, hematochezia, vomiting, or acute GI distress Musculoskeletal: Denies any acute onset joint swelling, redness, loss of ROM, or weakness Neurological: No reported episodes of acute onset apraxia, aphasia, dysarthria, agnosia, amnesia, paralysis, loss of coordination, or loss of consciousness  Medication Review  ARIPiprazole ER, Calcium Carb-Cholecalciferol, Fremanezumab-vfrm, Oxcarbazepine, QUEtiapine, SUMAtriptan, alendronate, butalbital-aspirin-caffeine, cetirizine, cyanocobalamin, ergocalciferol, eszopiclone, gabapentin, isosorbide dinitrate, lactulose, levothyroxine, nitroGLYCERIN, norethindrone-ethinyl estradiol, ondansetron, phentermine, ranolazine, rosuvastatin, tiZANidine, and traMADol  History Review  Allergy: Diana Quinn is allergic to depakote [divalproex sodium]; pregabalin; valproic acid; and levetiracetam. Drug: Diana Quinn  reports no history of drug use. Alcohol:  reports previous alcohol use. Tobacco:  reports that she has never smoked. She has never used smokeless tobacco. Social: Diana Quinn  reports that she has never smoked. She has never used smokeless tobacco. She reports previous alcohol use. She reports that she does not use drugs. Medical:  has a past medical history of Gastric bypass status for obesity, H/O unstable angina, Hypoglycemia, MI (myocardial infarction) (HCC), Orthostatic hypotension, Osteoporosis, Sciatic nerve disease, and Thyroid  disease. Surgical: Diana Quinn  has a past surgical history that includes Gastric bypass; Abdominal hysterectomy; Cholecystectomy;  Tonsillectomy; Gastric bypass open; Oophorectomy; Hernia repair; Abdominal adhesion surgery; Gastrostomy w/ feeding tube; Dilation and curettage of uterus; and Splenectomy. Family: family history includes Asthma in her sister; COPD in her mother; Cancer in her mother; Epilepsy in her brother; Hypertension in her mother; Psoriasis in her sister. Problem List: Diana Quinn has Chronic low back pain (Primary Area of Pain) (Bilateral) (L>R) w/ sciatica; Chronic lower extremity pain (Secondary Area of Pain) (Bilateral) (L>R); Chronic neck pain (Tertiary Area of Pain) (Bilateral) (L>R); Occipital headache (Fourth Area of Pain) (Bilateral) (L>R); and Chronic knee pain (Bilateral) (L>R) on their pertinent problem list.  Lab Review  Kidney Function Lab Results  Component Value Date   BUN 6 04/25/2018   CREATININE 0.63 04/25/2018   BCR 9 03/31/2018   GFRAA >60 04/25/2018   GFRNONAA >60 04/25/2018  Liver Function Lab Results  Component Value Date   AST 26 03/31/2018   ALT 20 10/05/2011   ALBUMIN 4.2 03/31/2018  Note: Above Lab results reviewed.  Imaging Review  DG C-Arm 1-60 Min-No Report Fluoroscopy was utilized by the requesting physician.  No radiographic  interpretation.  Note: Reviewed        Physical Exam  General appearance: Well nourished, well developed, and well hydrated. In no apparent acute distress Mental status: Alert, oriented x 3 (person, place, & time)       Respiratory: No evidence of acute respiratory distress Eyes: PERLA Vitals: BP (!) 123/53   Pulse 81   Temp 97.9 F (36.6 C)   Resp 16   Ht 5\' 2"  (1.575 m)   Wt 175 lb (79.4 kg)   SpO2 100%   BMI 32.01 kg/m  BMI: Estimated body mass index is 32.01 kg/m as calculated from the following:   Height as of this encounter: 5\' 2"  (1.575 m).   Weight as of this encounter: 175 lb (79.4  kg). Ideal: Ideal body weight: 50.1 kg (110 lb 7.2 oz) Adjusted ideal body weight: 61.8 kg (136 lb 4.3 oz) Lumbar Spine Area Exam  Skin & Axial Inspection: No masses, redness, or swelling Alignment: Symmetrical Functional ROM: Unrestricted ROM       Stability: No instability detected Muscle Tone/Strength: Functionally intact. No obvious neuro-muscular anomalies detected. Sensory (Neurological): Unimpaired Palpation: Complains of area being tender to palpation       Provocative Tests: Hyperextension/rotation test: Positive bilaterally for facet joint pain. L>R Lateral bending test: (+) ipsilateral radicular pain, on the left. Positive for left-sided foraminal stenosis.  Gait & Posture Assessment  Ambulation: Unassisted Gait: Relatively normal for age and body habitus Posture: WNL  Lower Extremity Exam    Side: Right lower extremity  Side: Left lower extremity  Stability: No instability observed          Stability: No instability observed          Skin & Extremity Inspection: Skin color, temperature, and hair growth are WNL. No peripheral edema or cyanosis. No masses, redness, swelling, asymmetry, or associated skin lesions. No contractures.  Skin & Extremity Inspection: Skin color, temperature, and hair growth are WNL. No peripheral edema or cyanosis. No masses, redness, swelling, asymmetry, or associated skin lesions. No contractures.  Functional ROM: Unrestricted ROM                  Functional ROM: Decreased ROM for knee joint          Muscle Tone/Strength: Functionally intact. No obvious neuro-muscular anomalies detected.  Muscle Tone/Strength: Functionally intact. No obvious neuro-muscular anomalies detected.  Sensory (Neurological): Referred pain pattern        Sensory (Neurological): Referred pain pattern        Palpation: No palpable anomalies  Palpation: No palpable anomalies   Assessment   Status Diagnosis  Persistent Persistent Persistent 1. Lumbar spondylosis   2.  Neurogenic pain   3. Chronic low back pain (Primary Area of Pain) (Bilateral) (L>R) w/ sciatica   4. Chronic musculoskeletal pain   5. Chronic pain syndrome   6. Osteoarthritis of knees (Bilateral)   7. Chronic sacroiliac joint pain (Bilateral)      Updated Problems: No problems updated.  Plan of Care  Pharmacotherapy (Medications Ordered): Meds ordered this encounter  Medications  . gabapentin (NEURONTIN) 300 MG capsule    Sig: Take 1 capsule (300 mg total) by mouth 4 (four) times daily. Follow written titration schedule.    Dispense:  120 capsule    Refill:  2    Do not place this medication, or any other prescription from our practice, on "Automatic Refill". Patient may have prescription filled one day early if pharmacy is closed on scheduled refill date.    Order Specific Question:   Supervising Provider    Answer:   Delano Metz [phone]  . tiZANidine (ZANAFLEX) 4 MG tablet    Sig: Take 1 tablet (4 mg total) by mouth 3 (three) times daily.    Dispense:  90 tablet    Refill:  2    Do not place medication on "Automatic Refill". Fill one day early if pharmacy is closed on scheduled refill date.    Order Specific Question:   Supervising Provider    Answer:   Delano Metz [phone]  . traMADol (ULTRAM) 50 MG tablet    Sig: Take 1 tablet (50 mg total) by mouth every 12 (twelve) hours as needed for up to 30 days for severe pain. Month last 30 days.    Dispense:  60 tablet    Refill:  0    Gray STOP ACT - Not applicable to Chronic Pain Syndrome (G89.4) diagnosis. Fill one day early if pharmacy is closed on scheduled refill date.    Order Specific Question:   Supervising Provider    Answer:   Delano Metz [[id number]]   Administered today: Susa Day had no medications administered during this visit.  Orders:  Orders Placed This Encounter  Procedures  . LUMBAR FACET(MEDIAL BRANCH NERVE BLOCK) MBNB    Standing Status:   Future    Standing Expiration Date:    07/01/2018    Scheduling Instructions:     Side: Bilateral     Level: L3-4, L4-5, & L5-S1 Facets (L2, L3, L4, L5, & S1 Medial Branch Nerves)     Sedation: With Sedation.     Timeframe: ASAA    Order Specific Question:   Where will this procedure be performed?    Answer:   [facility]   Follow-up plan:   Return in about 4 weeks (around 06/28/2018) for MedMgmt, w/ Dr. Laban Emperor, Procedure(w/Sedation), (B) L Fct NB, #2.  Pt would like to discuss changing her medication regimen.    Interventional options: Considering:   Diagnostic midline lumbarESI Diagnostic bilateral lumbar facet block Possible bilateral lumbar facetRFA Diagnostic bilateral occipital nerve block Possible bilateral occipital nerveRFA Diagnostic bilateral cervical facet nerve block Possible bilateral lumbar facetRFA Diagnostic left-sided sacroiliac joint injection Possible left-sided sacroiliac jointRFA   Palliative PRN treatment(s):   None at this time    Note by: Thad Ranger, NP  Date: 05/31/2018; Time: 11:22 AM

## 2018-06-28 ENCOUNTER — Ambulatory Visit: Payer: BLUE CROSS/BLUE SHIELD | Attending: Nurse Practitioner | Admitting: Nurse Practitioner

## 2018-06-28 ENCOUNTER — Other Ambulatory Visit: Payer: Self-pay

## 2018-06-28 DIAGNOSIS — M47816 Spondylosis without myelopathy or radiculopathy, lumbar region: Secondary | ICD-10-CM | POA: Diagnosis not present

## 2018-06-28 DIAGNOSIS — M7918 Myalgia, other site: Secondary | ICD-10-CM | POA: Diagnosis not present

## 2018-06-28 DIAGNOSIS — M533 Sacrococcygeal disorders, not elsewhere classified: Secondary | ICD-10-CM

## 2018-06-28 DIAGNOSIS — M792 Neuralgia and neuritis, unspecified: Secondary | ICD-10-CM

## 2018-06-28 DIAGNOSIS — G894 Chronic pain syndrome: Secondary | ICD-10-CM

## 2018-06-28 DIAGNOSIS — G8929 Other chronic pain: Secondary | ICD-10-CM

## 2018-06-28 MED ORDER — TRAMADOL HCL 50 MG PO TABS: 50.0000 mg | ORAL_TABLET | Freq: Two times a day (BID) | ORAL | 0 refills | Status: DC | PRN

## 2018-06-28 MED ORDER — GABAPENTIN 400 MG PO CAPS: 400.0000 mg | ORAL_CAPSULE | Freq: Three times a day (TID) | ORAL | 0 refills | Status: DC

## 2018-06-28 NOTE — Progress Notes (Signed)
Pain Management Encounter Note - Virtual Visit via Telephone Telehealth (real-time audio visits between healthcare provider and patient).  Patient's Phone No. & Preferred Pharmacy:  [phone] (home); [phone] (mobile); (Preferred) [phone]  [id number] [place], [place] - [address] AT [facility] [address] [place] [address] Phone: [phone] Fax: [phone]   Pre-screening note:  Our staff contacted Diana Quinn and offered Diana Quinn an "in person", "face-to-face" appointment versus a telephone encounter. She indicated preferring the telephone encounter, at this time.  Reason for Virtual Visit: COVID-19*  Social distancing based on CDC and AMA recommendations.   I contacted Diana Quinn on 06/28/2018 at 10:31 AM by telephone and clearly identified myself as Thad Ranger, NP. I verified that I was speaking with the correct person using two identifiers (Name and date of birth: May 22, 1965).  Advanced Informed Consent I sought verbal advanced consent from Diana Quinn for telemedicine interactions and virtual visit. I informed Diana Quinn of the security and privacy concerns, risks, and limitations associated with performing an evaluation and management service by telephone. I also informed Diana Quinn of the availability of "in person" appointments and I informed Diana Quinn of the possibility of a patient responsible charge related to this service. Diana Quinn expressed understanding and agreed to proceed.   Historic Elements   Diana Quinn is a 53 y.o. year old, female patient evaluated today after Diana Quinn last encounter by our practice on 05/31/2018. Diana Quinn  has a past medical history of Gastric bypass status for obesity, H/O unstable angina, Hypoglycemia, MI (myocardial infarction) (HCC), Orthostatic hypotension, Osteoporosis, Sciatic nerve disease, and Thyroid disease. She also  has a past surgical history that includes Gastric bypass;  Abdominal hysterectomy; Cholecystectomy; Tonsillectomy; Gastric bypass open; Oophorectomy; Hernia repair; Abdominal adhesion surgery; Gastrostomy w/ feeding tube; Dilation and curettage of uterus; and Splenectomy. Diana Quinn has a current medication list which includes the following prescription(s): fremanezumab-vfrm, ajovy, alendronate, aripiprazole er, butalbital-aspirin-caffeine, calcium carb-cholecalciferol, cetirizine, cyanocobalamin, ergocalciferol, eszopiclone, fyavolv, gabapentin, isosorbide dinitrate, lactulose, levothyroxine, nitroglycerin, ondansetron, oxcarbazepine, phentermine, quetiapine, ranolazine, rosuvastatin, rosuvastatin, sumatriptan, tizanidine, and tramadol. She  reports that she has never smoked. She has never used smokeless tobacco. She reports previous alcohol use. She reports that she does not use drugs. Diana Quinn is allergic to depakote [divalproex sodium]; pregabalin; valproic acid; and levetiracetam.   HPI  I last saw Diana Quinn on 05/31/2018. She is being evaluated for medication management. She is having 8-9/10. She is having burnging pain in Diana Quinn left leg into Diana Quinn buttock. She admits the burning pain feels like a tearing pain.  She admits that this is the worse pain. She denies any side effects of Diana Quinn gabapentin 300 mg QID. She is using heat and massage for Diana Quinn pain. She denies anything additional concerns. She feels like the tizanidine is effective for Diana Quinn muscle spasms in Diana Quinn back.   Pharmacotherapy Assessment  Analgesic: Tramadol 50 mg  BID MME/Quinn: 100 mg/Quinn.   Monitoring: Pharmacotherapy: No side-effects or adverse reactions reported. [place] PMP: PDMP reviewed during this encounter.       Compliance: No problems identified. Plan: Refer to "POC".  Review of recent tests  DG C-Arm 1-60 Min-No Report Fluoroscopy was utilized by the requesting physician.  No radiographic  interpretation.    Admission on 04/25/2018, Discharged on 04/25/2018  Component Date Value Ref Range  Status  . Sodium 04/25/2018 136  135 - 145 mmol/L Final  . Potassium 04/25/2018 4.0  3.5 -  5.1 mmol/L Final  . Chloride 04/25/2018 107  98 - 111 mmol/L Final  . CO2 04/25/2018 26  22 - 32 mmol/L Final  . Glucose, Bld 04/25/2018 103* 70 - 99 mg/dL Final  . BUN 00/93/8182 6  6 - 20 mg/dL Final  . Creatinine, Ser 04/25/2018 0.63  0.44 - 1.00 mg/dL Final  . Calcium 99/37/1696 7.9* 8.9 - 10.3 mg/dL Final  . GFR calc non Af Amer 04/25/2018 >60  >60 mL/min Final  . GFR calc Af Amer 04/25/2018 >60  >60 mL/min Final  . Anion gap 04/25/2018 3* 5 - 15 Final   Performed at [facility], [address], [place], [place] [address]  . WBC 04/25/2018 11.5* 4.0 - 10.5 K/uL Final  . RBC 04/25/2018 3.47* 3.87 - 5.11 MIL/uL Final  . Hemoglobin 04/25/2018 9.4* 12.0 - 15.0 g/dL Final  . HCT 12/24/5100 30.6* 36.0 - 46.0 % Final  . MCV 04/25/2018 88.2  80.0 - 100.0 fL Final  . MCH 04/25/2018 27.1  26.0 - 34.0 pg Final  . MCHC 04/25/2018 30.7  30.0 - 36.0 g/dL Final  . RDW 58/52/7782 13.9  11.5 - 15.5 % Final  . Platelets 04/25/2018 392  150 - 400 K/uL Final  . nRBC 04/25/2018 0.0  0.0 - 0.2 % Final   Performed at [facility], [address], [place], [place] [address]  . Color, Urine 04/25/2018 COLORLESS* YELLOW Final  . APPearance 04/25/2018 CLEAR* CLEAR Final  . Specific Gravity, Urine 04/25/2018 1.002* 1.005 - 1.030 Final  . pH 04/25/2018 6.0  5.0 - 8.0 Final  . Glucose, UA 04/25/2018 NEGATIVE  NEGATIVE mg/dL Final  . Hgb urine dipstick 04/25/2018 NEGATIVE  NEGATIVE Final  . Bilirubin Urine 04/25/2018 NEGATIVE  NEGATIVE Final  . Ketones, ur 04/25/2018 NEGATIVE  NEGATIVE mg/dL Final  . Protein, ur 61/44/3154 NEGATIVE  NEGATIVE mg/dL Final  . Nitrite 00/86/7619 NEGATIVE  NEGATIVE Final  . Glori Luis 04/25/2018 NEGATIVE  NEGATIVE Final  . RBC / HPF 04/25/2018 0-5  0 - 5 RBC/hpf Final  . WBC, UA 04/25/2018 0-5  0 - 5 WBC/hpf Final  . Bacteria, UA 04/25/2018 FEW* NONE SEEN Final   . Squamous Epithelial / LPF 04/25/2018 0-5  0 - 5 Final  . Mucus 04/25/2018 PRESENT   Final   Performed at [facility], [address] [place], [place] [address]  . Troponin I 04/25/2018 <0.03  <0.03 ng/mL Final   Performed at [facility], [address], [place], [place] [address]   Assessment  The primary encounter diagnosis was Lumbar spondylosis. Diagnoses of Chronic pain syndrome, Neurogenic pain, Chronic musculoskeletal pain, and Chronic sacroiliac joint pain (Bilateral) were also pertinent to this visit.  Plan of Care  I have discontinued Diana Quinn's gabapentin and gabapentin. I am also having Diana Quinn start on gabapentin. Additionally, I am having Diana Quinn maintain Diana Quinn alendronate, ARIPiprazole ER, Calcium Carb-Cholecalciferol, cyanocobalamin, ergocalciferol, lactulose, levothyroxine, nitroGLYCERIN, Fyavolv, ondansetron, Oxcarbazepine, QUEtiapine, rosuvastatin, SUMAtriptan, butalbital-aspirin-caffeine, ranolazine, isosorbide dinitrate, Ajovy, cetirizine, phentermine, eszopiclone, tiZANidine, Fremanezumab-vfrm, rosuvastatin, and traMADol.  Pharmacotherapy (Medications Ordered): Meds ordered this encounter  Medications  . traMADol (ULTRAM) 50 MG tablet    Sig: Take 1 tablet (50 mg total) by mouth every 12 (twelve) hours as needed for up to 30 days for severe pain. Month last 30 days.    Dispense:  60 tablet    Refill:  0    [place] STOP ACT - Not applicable to Chronic Pain Syndrome (G89.4) diagnosis. Fill one  Quinn early if pharmacy is closed on scheduled refill date.    Order Specific Question:   Supervising Provider    Answer:   Delano MetzNAVEIRA, FRANCISCO [phone][[id number]]  . gabapentin (NEURONTIN) 400 MG capsule    Sig: Take 1 capsule (400 mg total) by mouth 3 (three) times daily for 30 days.    Dispense:  90 capsule    Refill:  0    Order Specific Question:   Supervising Provider    Answer:   Delano MetzNAVEIRA, FRANCISCO [phone][[id number]]   Orders:  No orders of the defined types were placed in this  encounter.  Follow-up plan:   Return in about 4 weeks (around 07/26/2018) for MedMgmt.   I discussed the assessment and treatment plan with the patient. The patient was provided an opportunity to ask questions and all were answered. The patient agreed with the plan and demonstrated an understanding of the instructions.  Patient advised to call back or seek an in-person evaluation if the symptoms or condition worsens.  Total duration of non-face-to-face encounter: 11minutes.  Note by: Thad Rangerrystal Fredric Slabach, NP Date: 06/28/2018; Time: 10:43 AM  Disclaimer:  * Given the special circumstances of the COVID-19 pandemic, the federal government has announced that the Office for Civil Rights (OCR) will exercise its enforcement discretion and will not impose penalties on physicians using telehealth in the event of noncompliance with regulatory requirements under the [employer] Insurance Portability and Accountability Act (HIPAA) in connection with the good faith provision of telehealth during the COVID-19 national public health emergency. (AMA)

## 2018-06-28 NOTE — Patient Instructions (Signed)
____________________________________________________________________________________________  Medication Rules  Purpose: To inform patients, and their family members, of our rules and regulations.  Applies to: All patients receiving prescriptions (written or electronic).  Pharmacy of record: Pharmacy where electronic prescriptions will be sent. If written prescriptions are taken to a different pharmacy, please inform the nursing staff. The pharmacy listed in the electronic medical record should be the one where you would like electronic prescriptions to be sent.  Electronic prescriptions: In compliance with the [place] Strengthen Opioid Misuse Prevention (STOP) Act of 2017 (Session Law 2017-74/H243), effective March 10, 2018, all controlled substances must be electronically prescribed. Calling prescriptions to the pharmacy will cease to exist.  Prescription refills: Only during scheduled appointments. Applies to all prescriptions.  NOTE: The following applies primarily to controlled substances (Opioid* Pain Medications).   Patient's responsibilities: 1. Pain Pills: Bring all pain pills to every appointment (except for procedure appointments). 2. Pill Bottles: Bring pills in original pharmacy bottle. Always bring the newest bottle. Bring bottle, even if empty. 3. Medication refills: You are responsible for knowing and keeping track of what medications you take and those you need refilled. The day before your appointment: write a list of all prescriptions that need to be refilled. The day of the appointment: give the list to the admitting nurse. Prescriptions will be written only during appointments. No prescriptions will be written on procedure days. If you forget a medication: it will not be "Called in", "Faxed", or "electronically sent". You will need to get another appointment to get these prescribed. No early refills. Do not call asking to have your prescription filled  early. 4. Prescription Accuracy: You are responsible for carefully inspecting your prescriptions before leaving our office. Have the discharge nurse carefully go over each prescription with you, before taking them home. Make sure that your name is accurately spelled, that your address is correct. Check the name and dose of your medication to make sure it is accurate. Check the number of pills, and the written instructions to make sure they are clear and accurate. Make sure that you are given enough medication to last until your next medication refill appointment. 5. Taking Medication: Take medication as prescribed. When it comes to controlled substances, taking less pills or less frequently than prescribed is permitted and encouraged. Never take more pills than instructed. Never take medication more frequently than prescribed.  6. Inform other Doctors: Always inform, all of your healthcare providers, of all the medications you take. 7. Pain Medication from other Providers: You are not allowed to accept any additional pain medication from any other Doctor or Healthcare provider. There are two exceptions to this rule. (see below) In the event that you require additional pain medication, you are responsible for notifying us, as stated below. 8. Medication Agreement: You are responsible for carefully reading and following our Medication Agreement. This must be signed before receiving any prescriptions from our practice. Safely store a copy of your signed Agreement. Violations to the Agreement will result in no further prescriptions. (Additional copies of our Medication Agreement are available upon request.) 9. Laws, Rules, & Regulations: All patients are expected to follow all Federal and State Laws, Statutes, Rules, & Regulations. Ignorance of the Laws does not constitute a valid excuse. The use of any illegal substances is prohibited. 10. Adopted CDC guidelines & recommendations: Target dosing levels will be  at or below 60 MME/day. Use of benzodiazepines** is not recommended.  Exceptions: There are only two exceptions to the rule of not   receiving pain medications from other Healthcare Providers. 1. Exception #1 (Emergencies): In the event of an emergency (i.e.: accident requiring emergency care), you are allowed to receive additional pain medication. However, you are responsible for: As soon as you are able, call our office [phone], at any time of the day or night, and leave a message stating your name, the date and nature of the emergency, and the name and dose of the medication prescribed. In the event that your call is answered by a member of our staff, make sure to document and save the date, time, and the name of the person that took your information.  2. Exception #2 (Planned Surgery): In the event that you are scheduled by another doctor or dentist to have any type of surgery or procedure, you are allowed (for a period no longer than 30 days), to receive additional pain medication, for the acute post-op pain. However, in this case, you are responsible for picking up a copy of our "Post-op Pain Management for Surgeons" handout, and giving it to your surgeon or dentist. This document is available at our office, and does not require an appointment to obtain it. Simply go to our office during business hours (Monday-Thursday from 8:00 AM to 4:00 PM) (Friday 8:00 AM to 12:00 Noon) or if you have a scheduled appointment with us, prior to your surgery, and ask for it by name. In addition, you will need to provide us with your name, name of your surgeon, type of surgery, and date of procedure or surgery.  *Opioid medications include: morphine, codeine, oxycodone, oxymorphone, hydrocodone, hydromorphone, meperidine, tramadol, tapentadol, buprenorphine, fentanyl, methadone. **Benzodiazepine medications include: diazepam (Valium), alprazolam (Xanax), clonazepam (Klonopine), lorazepam (Ativan), clorazepate  (Tranxene), chlordiazepoxide (Librium), estazolam (Prosom), oxazepam (Serax), temazepam (Restoril), triazolam (Halcion) (Last updated: 05/07/2017) ____________________________________________________________________________________________    

## 2018-06-29 DIAGNOSIS — K912 Postsurgical malabsorption, not elsewhere classified: Secondary | ICD-10-CM | POA: Insufficient documentation

## 2018-07-06 ENCOUNTER — Encounter: Payer: Self-pay | Admitting: Pain Medicine

## 2018-07-11 ENCOUNTER — Inpatient Hospital Stay
Admission: EM | Admit: 2018-07-11 | Discharge: 2018-07-16 | DRG: 392 | Disposition: A | Discharge status: Home / Self Care | Payer: BLUE CROSS/BLUE SHIELD | Attending: Internal Medicine | Admitting: Internal Medicine

## 2018-07-11 ENCOUNTER — Other Ambulatory Visit: Payer: Self-pay

## 2018-07-11 ENCOUNTER — Encounter: Payer: Self-pay | Admitting: Emergency Medicine

## 2018-07-11 ENCOUNTER — Emergency Department: Payer: BLUE CROSS/BLUE SHIELD

## 2018-07-11 DIAGNOSIS — Z90710 Acquired absence of both cervix and uterus: Secondary | ICD-10-CM

## 2018-07-11 DIAGNOSIS — E871 Hypo-osmolality and hyponatremia: Secondary | ICD-10-CM | POA: Diagnosis present

## 2018-07-11 DIAGNOSIS — Z825 Family history of asthma and other chronic lower respiratory diseases: Secondary | ICD-10-CM

## 2018-07-11 DIAGNOSIS — D649 Anemia, unspecified: Secondary | ICD-10-CM | POA: Diagnosis present

## 2018-07-11 DIAGNOSIS — Z79890 Hormone replacement therapy (postmenopausal): Secondary | ICD-10-CM

## 2018-07-11 DIAGNOSIS — Z79891 Long term (current) use of opiate analgesic: Secondary | ICD-10-CM

## 2018-07-11 DIAGNOSIS — Z7983 Long term (current) use of bisphosphonates: Secondary | ICD-10-CM

## 2018-07-11 DIAGNOSIS — E119 Type 2 diabetes mellitus without complications: Secondary | ICD-10-CM | POA: Diagnosis present

## 2018-07-11 DIAGNOSIS — Z1159 Encounter for screening for other viral diseases: Secondary | ICD-10-CM

## 2018-07-11 DIAGNOSIS — K5909 Other constipation: Principal | ICD-10-CM | POA: Diagnosis present

## 2018-07-11 DIAGNOSIS — Z9049 Acquired absence of other specified parts of digestive tract: Secondary | ICD-10-CM

## 2018-07-11 DIAGNOSIS — G43909 Migraine, unspecified, not intractable, without status migrainosus: Secondary | ICD-10-CM | POA: Diagnosis present

## 2018-07-11 DIAGNOSIS — G894 Chronic pain syndrome: Secondary | ICD-10-CM | POA: Diagnosis present

## 2018-07-11 DIAGNOSIS — I2510 Atherosclerotic heart disease of native coronary artery without angina pectoris: Secondary | ICD-10-CM | POA: Diagnosis present

## 2018-07-11 DIAGNOSIS — F209 Schizophrenia, unspecified: Secondary | ICD-10-CM | POA: Diagnosis present

## 2018-07-11 DIAGNOSIS — E785 Hyperlipidemia, unspecified: Secondary | ICD-10-CM | POA: Diagnosis present

## 2018-07-11 DIAGNOSIS — E039 Hypothyroidism, unspecified: Secondary | ICD-10-CM | POA: Diagnosis present

## 2018-07-11 DIAGNOSIS — G8929 Other chronic pain: Secondary | ICD-10-CM | POA: Diagnosis present

## 2018-07-11 DIAGNOSIS — Z8249 Family history of ischemic heart disease and other diseases of the circulatory system: Secondary | ICD-10-CM

## 2018-07-11 DIAGNOSIS — Z820 Family history of epilepsy and other diseases of the nervous system: Secondary | ICD-10-CM

## 2018-07-11 DIAGNOSIS — I252 Old myocardial infarction: Secondary | ICD-10-CM

## 2018-07-11 DIAGNOSIS — F411 Generalized anxiety disorder: Secondary | ICD-10-CM | POA: Diagnosis present

## 2018-07-11 DIAGNOSIS — R109 Unspecified abdominal pain: Secondary | ICD-10-CM | POA: Diagnosis present

## 2018-07-11 DIAGNOSIS — Z9081 Acquired absence of spleen: Secondary | ICD-10-CM

## 2018-07-11 DIAGNOSIS — G40909 Epilepsy, unspecified, not intractable, without status epilepticus: Secondary | ICD-10-CM | POA: Diagnosis present

## 2018-07-11 DIAGNOSIS — Z9884 Bariatric surgery status: Secondary | ICD-10-CM

## 2018-07-11 DIAGNOSIS — Z79899 Other long term (current) drug therapy: Secondary | ICD-10-CM

## 2018-07-11 DIAGNOSIS — M810 Age-related osteoporosis without current pathological fracture: Secondary | ICD-10-CM | POA: Diagnosis present

## 2018-07-11 DIAGNOSIS — R1013 Epigastric pain: Secondary | ICD-10-CM | POA: Diagnosis not present

## 2018-07-11 LAB — URINALYSIS, COMPLETE (UACMP) WITH MICROSCOPIC
Bacteria, UA: NONE SEEN
Bilirubin Urine: NEGATIVE
Glucose, UA: NEGATIVE mg/dL
Hgb urine dipstick: NEGATIVE
Ketones, ur: NEGATIVE mg/dL
Nitrite: NEGATIVE
Protein, ur: NEGATIVE mg/dL
Specific Gravity, Urine: 1.021 (ref 1.005–1.030)
pH: 5 (ref 5.0–8.0)

## 2018-07-11 LAB — COMPREHENSIVE METABOLIC PANEL
ALT: 22 U/L (ref 0–44)
AST: 27 U/L (ref 15–41)
Albumin: 4.1 g/dL (ref 3.5–5.0)
Alkaline Phosphatase: 153 U/L — ABNORMAL HIGH (ref 38–126)
Anion gap: 11 (ref 5–15)
BUN: 9 mg/dL (ref 6–20)
CO2: 21 mmol/L — ABNORMAL LOW (ref 22–32)
Calcium: 8.2 mg/dL — ABNORMAL LOW (ref 8.9–10.3)
Chloride: 98 mmol/L (ref 98–111)
Creatinine, Ser: 0.59 mg/dL (ref 0.44–1.00)
GFR calc Af Amer: >60 mL/min (ref >60)
GFR calc non Af Amer: >60 mL/min (ref >60)
Glucose, Bld: 109 mg/dL — ABNORMAL HIGH (ref 70–99)
Potassium: 3.9 mmol/L (ref 3.5–5.1)
Sodium: 130 mmol/L — ABNORMAL LOW (ref 135–145)
Total Bilirubin: 0.6 mg/dL (ref 0.3–1.2)
Total Protein: 7 g/dL (ref 6.5–8.1)

## 2018-07-11 LAB — CBC
HCT: 32.7 % — ABNORMAL LOW (ref 36.0–46.0)
Hemoglobin: 10.3 g/dL — ABNORMAL LOW (ref 12.0–15.0)
MCH: 24.9 pg — ABNORMAL LOW (ref 26.0–34.0)
MCHC: 31.5 g/dL (ref 30.0–36.0)
MCV: 79.2 fL — ABNORMAL LOW (ref 80.0–100.0)
Platelets: 585 10*3/uL — ABNORMAL HIGH (ref 150–400)
RBC: 4.13 MIL/uL (ref 3.87–5.11)
RDW: 17.6 % — ABNORMAL HIGH (ref 11.5–15.5)
WBC: 9.9 10*3/uL (ref 4.0–10.5)
nRBC: 0 % (ref 0.0–0.2)

## 2018-07-11 LAB — LIPASE, BLOOD: Lipase: 22 U/L (ref 11–51)

## 2018-07-11 MED ORDER — IOHEXOL 300 MG/ML  SOLN
100.0000 mL | Freq: Once | INTRAMUSCULAR | Status: AC | PRN
  Administered 2018-07-11: 100 mL via INTRAVENOUS

## 2018-07-11 MED ORDER — PIPERACILLIN-TAZOBACTAM 3.375 G IVPB 30 MIN: 3.3750 g | Freq: Once | INTRAVENOUS | Status: DC

## 2018-07-11 MED ORDER — SODIUM CHLORIDE 0.9% FLUSH: 3.0000 mL | Freq: Once | INTRAVENOUS | Status: DC

## 2018-07-11 MED ORDER — ONDANSETRON HCL 4 MG/2ML IJ SOLN
4.0000 mg | Freq: Once | INTRAMUSCULAR | Status: AC
  Administered 2018-07-11: 4 mg via INTRAVENOUS
  Filled 2018-07-11: qty 2

## 2018-07-11 MED ORDER — HYDROMORPHONE HCL 1 MG/ML IJ SOLN
1.0000 mg | INTRAMUSCULAR | Status: AC
  Administered 2018-07-12: 01:00:00 1 mg via INTRAVENOUS
  Filled 2018-07-11: qty 1

## 2018-07-11 MED ORDER — SODIUM CHLORIDE 0.9 % IV BOLUS
1000.0000 mL | Freq: Once | INTRAVENOUS | Status: AC
  Administered 2018-07-11: 1000 mL via INTRAVENOUS

## 2018-07-11 MED ORDER — HYDROMORPHONE HCL 1 MG/ML IJ SOLN
1.0000 mg | Freq: Once | INTRAMUSCULAR | Status: AC
  Administered 2018-07-11: 1 mg via INTRAVENOUS
  Filled 2018-07-11: qty 1

## 2018-07-11 NOTE — ED Notes (Signed)
Patient transported to CT 

## 2018-07-11 NOTE — ED Triage Notes (Signed)
Per pt mid abd pain - states hx of pancreatitis and obstruction. Started yesterday, no n/v or diarrhea

## 2018-07-11 NOTE — Progress Notes (Signed)
07/11/18  Paged by Dr. Scotty Court at 11:36 pm for surgical consult.  Patient presented for evaluation of a 1.5 day history of abdominal pain.  Patient has a history of gastric bypass, cholecystectomy, pancreatitis, and chronic pain, and history of prior SBO.  Patient felt her pain was epigastric and similar to prior episodes of pancreatitis and SBO and thus presented to the ED.  Associated nausea but no emesis.  No BM in the last week per ED notes.  Her laboratory workup is unremarkable with normal WBC, LFTs, and lipase.  She had a CT scan which did not show small bowel obstruction or issues with her gastric bypass, but showed a possibly prominent appendix.  However, there is no surrounding stranding or inflammatory changes.  There is significant stool burden in the colon.  She was reassessed and noted to have tenderness in right lower quadrant, but this was not a presenting symptom.  At this point it is unclear that she would have appendicitis, given the normal labs and already more than 1 day of pain, and the fact that the pain that brings her in is epigastric.  Would recommend admission to medical team for observation.  Recommend keeping her NPO with IV fluid hydration, and would avoid starting any antibiotics for now as we do not have a target source.  Will re-evaluate her in the morning.  Full consult note to follow.  Henrene Dodge, MD

## 2018-07-11 NOTE — ED Provider Notes (Signed)
[facility] Provider Note  ____________________________________________  Time seen: Approximately 11:51 PM  I have reviewed the triage vital signs and the nursing notes.   HISTORY  Chief Complaint Abdominal Pain (mid epigastric)    HPI Diana Quinn is a 53 y.o. female with a history of gastric bypass, Chronic musculoskeletal pain syndromes, schizophrenia, epilepsy who comes the ED complaining of epigastric pain, started yesterday, constant, waxing and waning, no aggravating or alleviating factors.  Nausea and decreased oral intake without vomiting.  She also report constipation and no bowel movement in the last week.  Has a history of pancreatitis and bowel obstruction and this feels similar.  Denies fever chills sweats body aches cough chest pain or shortness of breath.     Past Medical History:  Diagnosis Date  . Gastric bypass status for obesity   . H/O unstable angina   . Hypoglycemia   . MI (myocardial infarction) (HCC)   . Orthostatic hypotension   . Osteoporosis   . Sciatic nerve disease   . Thyroid disease      Patient Active Problem List   Diagnosis Date Noted  . Spondylosis without myelopathy or radiculopathy, lumbosacral region 04/27/2018  . Chronic low back pain (Bilateral) (L>R) w/o sciatica 04/27/2018  . 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 Chronic hip pain (Bilateral) 04/14/2018  . Chronic sacroiliac joint pain (Bilateral) 04/14/2018  . Lumbar facet syndrome (Bilateral) (L>R) 04/14/2018  . Neurogenic pain 04/14/2018  . Chronic musculoskeletal pain 04/14/2018  . Chronic lumbar radiculitis (S1) (Bilateral) 04/14/2018  . Osteoarthritis of knees (Bilateral) 04/13/2018  . Tricompartment osteoarthritis of knee (Right) 04/13/2018  . Chronic low back pain (Primary Area of Pain) (Bilateral) (L>R) w/ sciatica 03/31/2018  . Chronic lower extremity pain (Secondary Area of Pain) (Bilateral) (L>R) 03/31/2018   . Chronic neck pain [facility] Area of Pain) (Bilateral) (L>R) 03/31/2018  . Occipital headache (Fourth Area of Pain) (Bilateral) (L>R) 03/31/2018  . Pharmacologic therapy 03/31/2018  . Disorder of skeletal system 03/31/2018  . Problems influencing health status 03/31/2018  . Chronic knee pain (Bilateral) (L>R) 03/31/2018  . Chronic pain syndrome 03/31/2018  . DDD (degenerative disc disease), lumbar 02/08/2018  . Other and unspecified hyperlipidemia 02/08/2018  . Lumbar spondylosis 02/08/2018  . Vitamin D deficiency 02/08/2018  . Angina pectoris (HCC) 01/26/2018  . Hypotension 01/26/2018  . Impaired glucose tolerance 01/26/2018  . Other B-complex deficiencies 01/26/2018  . Schizophrenia (HCC) 01/26/2018  . Epilepsy (HCC) 07/10/2011  . Bariatric surgery status 03/24/2011  . Bipolar disorder (HCC) 03/24/2011  . Generalized anxiety disorder 03/24/2011  . Hypothyroidism 03/24/2011  . Migraine headache 03/24/2011  . Sleep disturbances 03/24/2011     Past Surgical History:  Procedure Laterality Date  . ABDOMINAL ADHESION SURGERY    . ABDOMINAL HYSTERECTOMY    . CHOLECYSTECTOMY    . DILATION AND CURETTAGE OF UTERUS    . GASTRIC BYPASS    . GASTRIC BYPASS OPEN     revision  . GASTROSTOMY W/ FEEDING TUBE    . HERNIA REPAIR    . OOPHORECTOMY    . SPLENECTOMY    . TONSILLECTOMY       Prior to Admission medications   Medication Sig Start Date End Date Taking? Authorizing Provider  AJOVY 225 MG/1.5ML SOSY Inject 225 mg into the skin every 30 (thirty) days. 05/09/18   [provider]  alendronate (FOSAMAX) 70 MG tablet Take 70 mg by mouth once a week. 02/19/18   [provider]  ARIPiprazole  ER (ABILIFY MAINTENA) 400 MG PRSY prefilled syringe Inject 400 mg into the muscle every 30 (thirty) days. 01/26/18   [provider]  butalbital-aspirin-caffeine Benny Lennert) 50-325-40 MG capsule Take 1 capsule by mouth every 6 (six) hours as needed for headache.     [provider]  Calcium Carb-Cholecalciferol (CALTRATE 600+D3) 600-800 MG-UNIT TABS Take 1 tablet by mouth 2 (two) times daily. 01/26/18   [provider]  cetirizine (ZYRTEC) 10 MG tablet Take 10 mg by mouth daily. 04/29/18   [provider]  cyanocobalamin (,VITAMIN B-12,) 1000 MCG/ML injection Inject 1,000 mcg into the muscle every 30 (thirty) days. 02/25/18   [provider]  ergocalciferol (VITAMIN D2) 1.25 MG (50000 UT) capsule Take 1.25 mcg by mouth every 30 (thirty) days. 02/08/18   [provider]  eszopiclone (LUNESTA) 1 MG TABS tablet TK 1 T PO QHS 05/25/18   [provider]  Fremanezumab-vfrm (AJOVY) 225 MG/1.5ML SOSY Inject into the skin. 06/22/18 07/22/18  [provider]  FYAVOLV 0.5-2.5 MG-MCG tablet Take 1 tablet by mouth daily. 02/19/18   [provider]  gabapentin (NEURONTIN) 400 MG capsule Take 1 capsule (400 mg total) by mouth 3 (three) times daily for 30 days. 06/28/18 07/28/18  Barbette Merino, NP  isosorbide dinitrate (ISORDIL) 30 MG tablet Take 30 mg by mouth daily.    [provider]  lactulose (CHRONULAC) 10 GM/15ML solution Take 15 mLs by mouth as needed. 01/26/18   [provider]  levothyroxine (LEVOXYL) 88 MCG tablet Take 88 mcg by mouth daily. 01/26/18   [provider]  nitroGLYCERIN (NITROSTAT) 0.4 MG SL tablet Place 0.4 mg under the tongue as needed. 01/26/18   [provider]  ondansetron (ZOFRAN) 8 MG tablet Take 8 mg by mouth as needed. 01/26/18   [provider]  Oxcarbazepine (TRILEPTAL) 300 MG tablet Take 300 mg by mouth 2 (two) times daily. 01/26/18   [provider]  phentermine (ADIPEX-P) 37.5 MG tablet TK 1 T PO QAM 05/25/18   [provider]  QUEtiapine (SEROQUEL) 50 MG tablet Take 50 mg by mouth at bedtime. 03/15/18   [provider]  ranolazine (RANEXA) 500 MG 12 hr tablet Take 500 mg by mouth 2 (two) times daily.     [provider]  rosuvastatin (CRESTOR) 10 MG tablet TK 1 T PO QD 06/09/18   [provider]  rosuvastatin (CRESTOR) 5 MG tablet Take 5 mg by mouth daily. 02/08/18   [provider]  SUMAtriptan (IMITREX) 100 MG tablet Take 100 mg by mouth as needed. 01/26/18   [provider]  tiZANidine (ZANAFLEX) 4 MG tablet Take 1 tablet (4 mg total) by mouth 3 (three) times daily. 05/31/18 08/29/18  Barbette Merino, NP  traMADol (ULTRAM) 50 MG tablet Take 1 tablet (50 mg total) by mouth every 12 (twelve) hours as needed for up to 30 days for severe pain. Month last 30 days. 07/14/18 08/13/18  Barbette Merino, NP     Allergies Depakote [divalproex sodium]; Pregabalin; Valproic acid; and Levetiracetam   Family History  Problem Relation Age of Onset  . Cancer Mother   . Hypertension Mother   . COPD Mother   . Asthma Sister   . Psoriasis Sister   . Epilepsy Brother     Social History Social History   Tobacco Use  . Smoking status: Never Smoker  . Smokeless tobacco: Never Used  Substance Use Topics  . Alcohol use: Not Currently  .  Drug use: Never    Review of Systems  Constitutional:   No fever or chills.  ENT:   No sore throat. No rhinorrhea. Cardiovascular:   No chest pain or syncope. Respiratory:   No dyspnea or cough. Gastrointestinal: Positive as above abdominal pain and constipation..  Musculoskeletal:   Negative for focal pain or swelling All other systems reviewed and are negative except as documented above in ROS and HPI.  ____________________________________________   PHYSICAL EXAM:  VITAL SIGNS: ED Triage Vitals  Enc Vitals Group     BP 07/11/18 2005 (!) 151/82     Pulse Rate 07/11/18 2005 78     Resp 07/11/18 2005 20     Temp 07/11/18 2005 98.7 F (37.1 C)     Temp Source 07/11/18 2005 Oral     SpO2 07/11/18 2005 100 %     Weight 07/11/18 2006 180 lb (81.6 kg)     Height 07/11/18 2006 5\' 2"  (1.575 m)     Head Circumference --       Peak Flow --      Pain Score 07/11/18 2006 8     Pain Loc --      Pain Edu? --      Excl. in GC? --     Vital signs reviewed, nursing assessments reviewed.   Constitutional:   Alert and oriented. Non-toxic appearance. Eyes:   Conjunctivae are normal. EOMI. PERRL. ENT      Head:   Normocephalic and atraumatic.      Nose:   No congestion/rhinnorhea.       Mouth/Throat:   Dry mucous membranes, no pharyngeal erythema. No peritonsillar mass.       Neck:   No meningismus. Full ROM. Hematological/Lymphatic/Immunilogical:   No cervical lymphadenopathy. Cardiovascular:   RRR. Symmetric bilateral radial and DP pulses.  No murmurs. Cap refill less than 2 seconds. Respiratory:   Normal respiratory effort without tachypnea/retractions. Breath sounds are clear and equal bilaterally. No wheezes/rales/rhonchi. Gastrointestinal:   Soft with pronounced epigastric tenderness non distended. There is no CVA tenderness.  No rebound, rigidity, or guarding.  Musculoskeletal:   Normal range of motion in all extremities. No joint effusions.  No lower extremity tenderness.  No edema. Neurologic:   Normal speech and language.  Motor grossly intact. No acute focal neurologic deficits are appreciated.  Skin:    Skin is warm, dry and intact. No rash noted.  No petechiae, purpura, or bullae.  ____________________________________________    LABS (pertinent positives/negatives) (all labs ordered are listed, but only abnormal results are displayed) Labs Reviewed  COMPREHENSIVE METABOLIC PANEL - Abnormal; Notable for the following components:      Result Value   Sodium 130 (*)    CO2 21 (*)    Glucose, Bld 109 (*)    Calcium 8.2 (*)    Alkaline Phosphatase 153 (*)    All other components within normal limits  CBC - Abnormal; Notable for the following components:   Hemoglobin 10.3 (*)    HCT 32.7 (*)    MCV 79.2 (*)    MCH 24.9 (*)    RDW 17.6 (*)    Platelets 585 (*)    All other components within  normal limits  URINALYSIS, COMPLETE (UACMP) WITH MICROSCOPIC - Abnormal; Notable for the following components:   Color, Urine YELLOW (*)    APPearance HAZY (*)    Leukocytes,Ua TRACE (*)    All other components within normal limits  SARS CORONAVIRUS 2 (HOSPITAL ORDER,  PERFORMED IN [facility])  LIPASE, BLOOD   ____________________________________________   EKG  Interpreted by me Normal sinus rhythm rate of 74, normal axis and intervals.  Normal QRS ST segments and T waves.  ____________________________________________    RADIOLOGY  Ct Abdomen Pelvis W Contrast  Result Date: 07/11/2018 CLINICAL DATA:  Mid abdominal pain. History of pancreatitis and obstruction. History of gastric bypass surgery. Hernia repair. Splenectomy and right oophorectomy. EXAM: CT ABDOMEN AND PELVIS WITH CONTRAST TECHNIQUE: Multidetector CT imaging of the abdomen and pelvis was performed using the standard protocol following bolus administration of intravenous contrast. CONTRAST:  OMNIPAQUE IOHEXOL 300 MG/ML  SOLN COMPARISON:  None. FINDINGS: Lower chest: No acute abnormality. Hepatobiliary: The patient is status post cholecystectomy. Mild intra and extrahepatic biliary dilatation is likely due to previous cholecystectomy. No liver masses. The portal vein and its branches are patent. Pancreas: No pancreatic mass or evidence of acute pancreatitis. Spleen: The patient is status post splenectomy. A small amount of splenic tissue appears to remain in the left upper quadrant. Adrenals/Urinary Tract: Adrenal glands are unremarkable. Kidneys are normal, without renal calculi, focal lesion, or hydronephrosis. Bladder is unremarkable. Stomach/Bowel: The patient is status post gastric bypass. The postoperative stomach is unremarkable. The small bowel is nondistended with no small bowel obstruction identified. Moderate to severe fecal loading is seen throughout the colon. The appendix is prominent caliber  measuring up to 11 mm proximally. It is much smaller in caliber distally. There is no adjacent periappendiceal stranding. Vascular/Lymphatic: No significant vascular findings are present. No enlarged abdominal or pelvic lymph nodes. Reproductive: Status post hysterectomy. No adnexal masses. Other: No abdominal wall hernia or abnormality. No abdominopelvic ascites. Musculoskeletal: No acute or significant osseous findings. IMPRESSION: 1. The appendix is prominent in caliber along most of its length measuring up to 11 mm. However, there is no adjacent stranding. Also, the patient's white blood cell count is normal. Despite the prominence in caliber, suspicion for appendicitis is low based on today's imaging due to the lack of adjacent stranding and the lack of an elevated white blood cell count. Recommend clinical correlation and follow-up if clinically warranted. 2. Status post gastric bypass.  No small bowel obstruction. 3. Severe fecal loading throughout the length of the colon. 4. Intra and extrahepatic biliary duct dilatation is likely from previous cholecystectomy. Of note, the patient's bilirubin is normal today. 5. 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 Electronically Signed   By: Gerome Sam III M.D   On: 07/11/2018 23:17    ____________________________________________   PROCEDURES Procedures  ____________________________________________  DIFFERENTIAL DIAGNOSIS   Pancreatitis, bowel obstruction, gastritis, perforation, colitis  CLINICAL IMPRESSION / ASSESSMENT AND PLAN / ED COURSE  Medications ordered in the ED: Medications  sodium chloride flush (NS) 0.9 % injection 3 mL (has no administration in time range)  HYDROmorphone (DILAUDID) injection 1 mg (has no administration in time range)  HYDROmorphone (DILAUDID) injection 1 mg (1 mg Intravenous Given 07/11/18 2230)  ondansetron (ZOFRAN) injection 4 mg (4 mg Intravenous Given 07/11/18 2229)  sodium  chloride 0.9 % bolus 1,000 mL (1,000 mLs Intravenous Bolus 07/11/18 2230)  iohexol (OMNIPAQUE) 300 MG/ML solution 100 mL (100 mLs Intravenous Contrast Given 07/11/18 2248)    Pertinent labs & imaging results that were available during my care of the patient were reviewed by me and considered in my medical decision making (see chart for details).  Diana Quinn was evaluated in Emergency Department on 07/11/2018 for the  symptoms described in the history of present illness. She was evaluated in the context of the global COVID-19 pandemic, which necessitated consideration that the patient might be at risk for infection with the SARS-CoV-2 virus that causes COVID-19. Institutional protocols and algorithms that pertain to the evaluation of patients at risk for COVID-19 are in a state of rapid change based on information released by regulatory bodies including the CDC and federal and state organizations. These policies and algorithms were followed during the patient's care in the ED.   Patient presents with severe abdominal pain and tenderness.  With her complicated medical history and surgical history, will need to check labs and CT scan.  IV Dilaudid 1 mg and IV Zofran 4 mg for symptom relief, IV fluid bolus for rehydration.  ----------------------------------------- 11:54 PM on 07/11/2018 -----------------------------------------  CT scan shows slightly dilated appendix without inflammatory changes.  Patient reassessed, has persistent epigastric tenderness but also substantial right lower quadrant tenderness on reexamination.    No evidence of pancreatitis or bowel obstruction on labs or CT imaging.  Case discussed with surgery Dr. Aleen Campi who recommends observation under hospitalist service for reassessment tomorrow.  Recommends holding antibiotics for now and focusing on symptom relief.      ____________________________________________   FINAL CLINICAL IMPRESSION(S) / ED DIAGNOSES    Final  diagnoses:  Hx of gastric bypass  Intractable abdominal pain     ED Discharge Orders    None      Portions of this note were generated with dragon dictation software. Dictation errors may occur despite best attempts at proofreading.   Sharman Cheek, MD 07/11/18 2355

## 2018-07-11 NOTE — ED Notes (Signed)
Pt states chronic problem and has requested referral to GI. Per pt the pain comes and goes over months

## 2018-07-11 NOTE — ED Notes (Signed)
Pt back from CT

## 2018-07-12 ENCOUNTER — Other Ambulatory Visit: Payer: Self-pay

## 2018-07-12 DIAGNOSIS — R1084 Generalized abdominal pain: Secondary | ICD-10-CM

## 2018-07-12 DIAGNOSIS — G8929 Other chronic pain: Secondary | ICD-10-CM | POA: Diagnosis present

## 2018-07-12 DIAGNOSIS — R109 Unspecified abdominal pain: Secondary | ICD-10-CM | POA: Diagnosis present

## 2018-07-12 LAB — GLUCOSE, CAPILLARY
Glucose-Capillary: 90 mg/dL (ref 70–99)
Glucose-Capillary: 110 mg/dL — ABNORMAL HIGH (ref 70–99)
Glucose-Capillary: 89 mg/dL (ref 70–99)
Glucose-Capillary: 103 mg/dL — ABNORMAL HIGH (ref 70–99)
Glucose-Capillary: 121 mg/dL — ABNORMAL HIGH (ref 70–99)
Glucose-Capillary: 89 mg/dL (ref 70–99)

## 2018-07-12 LAB — CBC
HCT: 29.3 % — ABNORMAL LOW (ref 36.0–46.0)
Hemoglobin: 9.4 g/dL — ABNORMAL LOW (ref 12.0–15.0)
MCH: 25.4 pg — ABNORMAL LOW (ref 26.0–34.0)
MCHC: 32.1 g/dL (ref 30.0–36.0)
MCV: 79.2 fL — ABNORMAL LOW (ref 80.0–100.0)
Platelets: 551 10*3/uL — ABNORMAL HIGH (ref 150–400)
RBC: 3.7 MIL/uL — ABNORMAL LOW (ref 3.87–5.11)
RDW: 17.8 % — ABNORMAL HIGH (ref 11.5–15.5)
WBC: 8.8 10*3/uL (ref 4.0–10.5)
nRBC: 0 % (ref 0.0–0.2)

## 2018-07-12 LAB — HEMOGLOBIN A1C
Hgb A1c MFr Bld: 6.2 % — ABNORMAL HIGH (ref 4.8–5.6)
Mean Plasma Glucose: 131.24 mg/dL

## 2018-07-12 LAB — SARS CORONAVIRUS 2 BY RT PCR (HOSPITAL ORDER, PERFORMED IN ~~LOC~~ HOSPITAL LAB): SARS Coronavirus 2: NEGATIVE

## 2018-07-12 LAB — TSH: TSH: 1.191 u[IU]/mL (ref 0.350–4.500)

## 2018-07-12 MED ORDER — ZOLPIDEM TARTRATE 5 MG PO TABS
5.0000 mg | ORAL_TABLET | Freq: Every day | ORAL | Status: DC
  Administered 2018-07-12 – 2018-07-15 (×4): 5 mg via ORAL
  Filled 2018-07-12 (×4): qty 1

## 2018-07-12 MED ORDER — QUETIAPINE FUMARATE 25 MG PO TABS
50.0000 mg | ORAL_TABLET | Freq: Every day | ORAL | Status: DC
  Administered 2018-07-12 – 2018-07-15 (×4): 50 mg via ORAL
  Filled 2018-07-12 (×4): qty 2

## 2018-07-12 MED ORDER — ROSUVASTATIN CALCIUM 10 MG PO TABS
5.0000 mg | ORAL_TABLET | Freq: Every day | ORAL | Status: DC
  Administered 2018-07-12 – 2018-07-14 (×3): 5 mg via ORAL
  Filled 2018-07-12 (×3): qty 1

## 2018-07-12 MED ORDER — GABAPENTIN 400 MG PO CAPS
400.0000 mg | ORAL_CAPSULE | Freq: Four times a day (QID) | ORAL | Status: DC
  Administered 2018-07-12 – 2018-07-16 (×17): 400 mg via ORAL
  Filled 2018-07-12 (×17): qty 1

## 2018-07-12 MED ORDER — LACTULOSE 10 GM/15ML PO SOLN: 10.0000 g | ORAL | Status: DC | PRN

## 2018-07-12 MED ORDER — ACETAMINOPHEN 325 MG PO TABS
650.0000 mg | ORAL_TABLET | Freq: Four times a day (QID) | ORAL | Status: DC | PRN
  Filled 2018-07-12: qty 2

## 2018-07-12 MED ORDER — LACTULOSE 10 GM/15ML PO SOLN
20.0000 g | Freq: Three times a day (TID) | ORAL | Status: DC
  Administered 2018-07-12 – 2018-07-16 (×13): 20 g via ORAL
  Filled 2018-07-12 (×13): qty 30

## 2018-07-12 MED ORDER — INSULIN ASPART 100 UNIT/ML ~~LOC~~ SOLN
0.0000 [IU] | Freq: Three times a day (TID) | SUBCUTANEOUS | Status: DC
  Administered 2018-07-14: 2 [IU] via SUBCUTANEOUS
  Administered 2018-07-15: 1 [IU] via SUBCUTANEOUS
  Filled 2018-07-12 (×2): qty 1

## 2018-07-12 MED ORDER — NORETHINDRONE-ETH ESTRADIOL 0.5-2.5 MG-MCG PO TABS: 1.0000 | ORAL_TABLET | Freq: Every day | ORAL | Status: DC

## 2018-07-12 MED ORDER — LEVOTHYROXINE SODIUM 88 MCG PO TABS
88.0000 ug | ORAL_TABLET | Freq: Every day | ORAL | Status: DC
  Administered 2018-07-12 – 2018-07-16 (×5): 88 ug via ORAL
  Filled 2018-07-12 (×5): qty 1

## 2018-07-12 MED ORDER — MORPHINE SULFATE (PF) 4 MG/ML IV SOLN
4.0000 mg | INTRAVENOUS | Status: DC | PRN
  Administered 2018-07-12: 4 mg via INTRAVENOUS
  Filled 2018-07-12: qty 1

## 2018-07-12 MED ORDER — LORATADINE 10 MG PO TABS
10.0000 mg | ORAL_TABLET | Freq: Every day | ORAL | Status: DC
  Administered 2018-07-12 – 2018-07-16 (×5): 10 mg via ORAL
  Filled 2018-07-12 (×5): qty 1

## 2018-07-12 MED ORDER — FERROUS SULFATE 325 (65 FE) MG PO TABS
325.0000 mg | ORAL_TABLET | Freq: Two times a day (BID) | ORAL | Status: DC
  Administered 2018-07-12 – 2018-07-16 (×7): 325 mg via ORAL
  Filled 2018-07-12 (×8): qty 1

## 2018-07-12 MED ORDER — OXYCODONE-ACETAMINOPHEN 5-325 MG PO TABS
1.0000 | ORAL_TABLET | Freq: Four times a day (QID) | ORAL | Status: DC | PRN
  Administered 2018-07-12: 2 via ORAL
  Administered 2018-07-12 (×2): 1 via ORAL
  Administered 2018-07-13 (×2): 2 via ORAL
  Filled 2018-07-12 (×2): qty 2
  Filled 2018-07-12 (×2): qty 1
  Filled 2018-07-12 (×2): qty 2

## 2018-07-12 MED ORDER — MORPHINE SULFATE (PF) 2 MG/ML IV SOLN
2.0000 mg | INTRAVENOUS | Status: DC | PRN
  Administered 2018-07-12: 2 mg via INTRAVENOUS
  Filled 2018-07-12: qty 1

## 2018-07-12 MED ORDER — ENOXAPARIN SODIUM 40 MG/0.4ML ~~LOC~~ SOLN
40.0000 mg | SUBCUTANEOUS | Status: DC
  Administered 2018-07-12 – 2018-07-16 (×5): 40 mg via SUBCUTANEOUS
  Filled 2018-07-12 (×5): qty 0.4

## 2018-07-12 MED ORDER — INSULIN ASPART 100 UNIT/ML ~~LOC~~ SOLN: 0.0000 [IU] | Freq: Every day | SUBCUTANEOUS | Status: DC

## 2018-07-12 MED ORDER — MORPHINE SULFATE (PF) 2 MG/ML IV SOLN
2.0000 mg | Freq: Once | INTRAVENOUS | Status: AC
  Administered 2018-07-12: 04:00:00 2 mg via INTRAVENOUS
  Filled 2018-07-12: qty 1

## 2018-07-12 MED ORDER — ONDANSETRON HCL 4 MG/2ML IJ SOLN
4.0000 mg | Freq: Four times a day (QID) | INTRAMUSCULAR | Status: DC | PRN
  Administered 2018-07-12 – 2018-07-15 (×3): 4 mg via INTRAVENOUS
  Filled 2018-07-12 (×3): qty 2

## 2018-07-12 MED ORDER — NITROGLYCERIN 0.4 MG SL SUBL: 0.4000 mg | SUBLINGUAL_TABLET | SUBLINGUAL | Status: DC | PRN

## 2018-07-12 MED ORDER — CALCIUM CARBONATE-VITAMIN D 500-200 MG-UNIT PO TABS
1.0000 | ORAL_TABLET | Freq: Two times a day (BID) | ORAL | Status: DC
  Administered 2018-07-12 – 2018-07-16 (×9): 1 via ORAL
  Filled 2018-07-12 (×9): qty 1

## 2018-07-12 MED ORDER — FLEET ENEMA 7-19 GM/118ML RE ENEM
1.0000 | ENEMA | Freq: Once | RECTAL | Status: AC
  Administered 2018-07-12: 1 via RECTAL

## 2018-07-12 MED ORDER — INSULIN ASPART 100 UNIT/ML ~~LOC~~ SOLN: 0.0000 [IU] | SUBCUTANEOUS | Status: DC

## 2018-07-12 MED ORDER — RANOLAZINE ER 500 MG PO TB12
500.0000 mg | ORAL_TABLET | Freq: Two times a day (BID) | ORAL | Status: DC
  Administered 2018-07-12 – 2018-07-16 (×9): 500 mg via ORAL
  Filled 2018-07-12 (×12): qty 1

## 2018-07-12 MED ORDER — SODIUM CHLORIDE 0.9 % IV SOLN
INTRAVENOUS | Status: DC
  Administered 2018-07-12 – 2018-07-13 (×4): via INTRAVENOUS

## 2018-07-12 MED ORDER — OXCARBAZEPINE 300 MG PO TABS
300.0000 mg | ORAL_TABLET | Freq: Two times a day (BID) | ORAL | Status: DC
  Administered 2018-07-12 – 2018-07-16 (×9): 300 mg via ORAL
  Filled 2018-07-12 (×12): qty 1

## 2018-07-12 MED ORDER — BUTALBITAL-APAP-CAFFEINE 50-325-40 MG PO TABS
1.0000 | ORAL_TABLET | ORAL | Status: DC | PRN
  Administered 2018-07-13: 1 via ORAL
  Filled 2018-07-12: qty 1

## 2018-07-12 MED ORDER — TIZANIDINE HCL 4 MG PO TABS
4.0000 mg | ORAL_TABLET | Freq: Three times a day (TID) | ORAL | Status: DC
  Administered 2018-07-12 – 2018-07-16 (×13): 4 mg via ORAL
  Filled 2018-07-12 (×16): qty 1

## 2018-07-12 MED ORDER — ACETAMINOPHEN 650 MG RE SUPP: 650.0000 mg | Freq: Four times a day (QID) | RECTAL | Status: DC | PRN

## 2018-07-12 MED ORDER — SUMATRIPTAN SUCCINATE 50 MG PO TABS
100.0000 mg | ORAL_TABLET | ORAL | Status: DC | PRN
  Filled 2018-07-12: qty 2

## 2018-07-12 MED ORDER — ONDANSETRON HCL 4 MG PO TABS
4.0000 mg | ORAL_TABLET | Freq: Four times a day (QID) | ORAL | Status: DC | PRN
  Administered 2018-07-14 – 2018-07-16 (×2): 4 mg via ORAL
  Filled 2018-07-12 (×2): qty 1

## 2018-07-12 MED ORDER — DOCUSATE SODIUM 100 MG PO CAPS
100.0000 mg | ORAL_CAPSULE | Freq: Two times a day (BID) | ORAL | Status: DC
  Administered 2018-07-12 – 2018-07-16 (×9): 100 mg via ORAL
  Filled 2018-07-12 (×9): qty 1

## 2018-07-12 NOTE — Progress Notes (Signed)
Pastoral Care Visit   07/12/18 1600  Clinical Encounter Type  Visited With Patient  Visit Type Initial;Other (Comment);Psychological support (HCPOA)  Referral From Patient;Nurse  Consult/Referral To Chaplain  Stress Factors  Patient Stress Factors Health changes   Pt wanted to complte HCPOA.  Pt was sitting up in chair and welcoming to [place].  Chap educated pt to the HCPOA ppwk.    Milinda Antis, [address]

## 2018-07-12 NOTE — ED Notes (Signed)
ED TO INPATIENT HANDOFF REPORT  ED Nurse Name and Phone #: Lurena Joiner [phone]  S Name/Age/Gender Susa Day 53 y.o. female Room/Bed: ED16A/ED16A  Code Status   Code Status: Not on file  Home/SNF/Other Home Patient oriented to: self, place, time and situation Is this baseline? Yes   Triage Complete: Triage complete  Chief Complaint abdominal pain  Triage Note Per pt mid abd pain - states hx of pancreatitis and obstruction. Started yesterday, no n/v or diarrhea   Allergies Allergies  Allergen Reactions  . Depakote [Divalproex Sodium] Shortness Of Breath  . Pregabalin Rash and Other (See Comments)    Other Reaction: falls; decreased coordination  . Valproic Acid Other (See Comments)    Other Reaction: Other reaction  . Levetiracetam Rash and Itching    Level of Care/Admitting Diagnosis ED Disposition    ED Disposition Condition Comment   Admit  Hospital Area: [facility] [[id number]]  Level of Care: Med-Surg [16]  Covid Evaluation: Screening Protocol (No Symptoms)  Diagnosis: Intractable abdominal pain [720109]  Admitting Physician: Arnaldo Natal [[id number]]  Attending Physician: Arnaldo Natal [phone]  PT Class (Do Not Modify): Observation [104]  PT Acc Code (Do Not Modify): Observation [10022]       B Medical/Surgery History Past Medical History:  Diagnosis Date  . Gastric bypass status for obesity   . H/O unstable angina   . Hypoglycemia   . MI (myocardial infarction) (HCC)   . Orthostatic hypotension   . Osteoporosis   . Sciatic nerve disease   . Thyroid disease    Past Surgical History:  Procedure Laterality Date  . ABDOMINAL ADHESION SURGERY    . ABDOMINAL HYSTERECTOMY    . CHOLECYSTECTOMY    . DILATION AND CURETTAGE OF UTERUS    . GASTRIC BYPASS    . GASTRIC BYPASS OPEN     revision  . GASTROSTOMY W/ FEEDING TUBE    . HERNIA REPAIR    . OOPHORECTOMY    . SPLENECTOMY    . TONSILLECTOMY       A IV  Location/Drains/Wounds Patient Lines/Drains/Airways Status   Active Line/Drains/Airways    Name:   Placement date:   Placement time:   Site:   Days:   Peripheral IV 07/11/18 Left Antecubital   07/11/18    2214    Antecubital   1          Intake/Output Last 24 hours No intake or output data in the 24 hours ending 07/12/18 0236  Labs/Imaging Results for orders placed or performed during the hospital encounter of 07/11/18 (from the past 48 hour(s))  Lipase, blood     Status: None   Collection Time: 07/11/18  8:45 PM  Result Value Ref Range   Lipase 22 11 - 51 U/L    Comment: Performed at [facility], [address] [place], [place] [address]  Comprehensive metabolic panel     Status: Abnormal   Collection Time: 07/11/18  8:45 PM  Result Value Ref Range   Sodium 130 (L) 135 - 145 mmol/L   Potassium 3.9 3.5 - 5.1 mmol/L   Chloride 98 98 - 111 mmol/L   CO2 21 (L) 22 - 32 mmol/L   Glucose, Bld 109 (H) 70 - 99 mg/dL   BUN 9 6 - 20 mg/dL   Creatinine, Ser 6.21 0.44 - 1.00 mg/dL   Calcium 8.2 (L) 8.9 - 10.3 mg/dL   Total Protein 7.0 6.5 - 8.1 g/dL   Albumin 4.1  3.5 - 5.0 g/dL   AST 27 15 - 41 U/L   ALT 22 0 - 44 U/L   Alkaline Phosphatase 153 (H) 38 - 126 U/L   Total Bilirubin 0.6 0.3 - 1.2 mg/dL   GFR calc non Af Amer >60 >60 mL/min   GFR calc Af Amer >60 >60 mL/min   Anion gap 11 5 - 15    Comment: Performed at [facility], [address] [place], [place] [address]  CBC     Status: Abnormal   Collection Time: 07/11/18  8:45 PM  Result Value Ref Range   WBC 9.9 4.0 - 10.5 K/uL   RBC 4.13 3.87 - 5.11 MIL/uL   Hemoglobin 10.3 (L) 12.0 - 15.0 g/dL   HCT 95.2 (L) 84.1 - 32.4 %   MCV 79.2 (L) 80.0 - 100.0 fL   MCH 24.9 (L) 26.0 - 34.0 pg   MCHC 31.5 30.0 - 36.0 g/dL   RDW 40.1 (H) 02.7 - 25.3 %   Platelets 585 (H) 150 - 400 K/uL   nRBC 0.0 0.0 - 0.2 %    Comment: Performed at [facility], [address] [place], [place] [address]   Urinalysis, Complete w Microscopic     Status: Abnormal   Collection Time: 07/11/18 10:03 PM  Result Value Ref Range   Color, Urine YELLOW (A) YELLOW   APPearance HAZY (A) CLEAR   Specific Gravity, Urine 1.021 1.005 - 1.030   pH 5.0 5.0 - 8.0   Glucose, UA NEGATIVE NEGATIVE mg/dL   Hgb urine dipstick NEGATIVE NEGATIVE   Bilirubin Urine NEGATIVE NEGATIVE   Ketones, ur NEGATIVE NEGATIVE mg/dL   Protein, ur NEGATIVE NEGATIVE mg/dL   Nitrite NEGATIVE NEGATIVE   Leukocytes,Ua TRACE (A) NEGATIVE   RBC / HPF 0-5 0 - 5 RBC/hpf   WBC, UA 0-5 0 - 5 WBC/hpf   Bacteria, UA NONE SEEN NONE SEEN   Squamous Epithelial / LPF 6-10 0 - 5   Mucus PRESENT     Comment: Performed at [facility], [address] [place], [place] [address]  SARS Coronavirus 2 (CEPHEID - Performed in [facility] hospital lab), Hosp Order     Status: None   Collection Time: 07/12/18 12:29 AM  Result Value Ref Range   SARS Coronavirus 2 NEGATIVE NEGATIVE    Comment: (NOTE) If result is NEGATIVE SARS-CoV-2 target nucleic acids are NOT DETECTED. The SARS-CoV-2 RNA is generally detectable in upper and lower  respiratory specimens during the acute phase of infection. The lowest  concentration of SARS-CoV-2 viral copies this assay can detect is 250  copies / mL. A negative result does not preclude SARS-CoV-2 infection  and should not be used as the sole basis for treatment or other  patient management decisions.  A negative result may occur with  improper specimen collection / handling, submission of specimen other  than nasopharyngeal swab, presence of viral mutation(s) within the  areas targeted by this assay, and inadequate number of viral copies  (<250 copies / mL). A negative result must be combined with clinical  observations, patient history, and epidemiological information. If result is POSITIVE SARS-CoV-2 target nucleic acids are DETECTED. The SARS-CoV-2 RNA is generally detectable in upper and lower   respiratory specimens dur ing the acute phase of infection.  Positive  results are indicative of active infection with SARS-CoV-2.  Clinical  correlation with patient history and other diagnostic information is  necessary to determine patient infection status.  Positive results do  not rule out bacterial infection or co-infection with other viruses. If result is PRESUMPTIVE POSTIVE SARS-CoV-2 nucleic acids MAY BE PRESENT.   A presumptive positive result was obtained on the submitted specimen  and confirmed on repeat testing.  While 2019 novel coronavirus  (SARS-CoV-2) nucleic acids may be present in the submitted sample  additional confirmatory testing may be necessary for epidemiological  and / or clinical management purposes  to differentiate between  SARS-CoV-2 and other Sarbecovirus currently known to infect humans.  If clinically indicated additional testing with an alternate test  methodology [id number]) is advised. The SARS-CoV-2 RNA is generally  detectable in upper and lower respiratory sp ecimens during the acute  phase of infection. The expected result is Negative. Fact Sheet for Patients:  BoilerBrush.com.cy Fact Sheet for Healthcare Providers: https://pope.com/ This test is not yet approved or cleared by the [place] FDA and has been authorized for detection and/or diagnosis of SARS-CoV-2 by FDA under an Emergency Use Authorization (EUA).  This EUA will remain in effect (meaning this test can be used) for the duration of the COVID-19 declaration under Section 564(b)(1) of the Act, 21 U.S.C. section 360bbb-3(b)(1), unless the authorization is terminated or revoked sooner. Performed at [facility], [address], [place], [place] [address]    Ct Abdomen Pelvis W Contrast  Result Date: 07/11/2018 CLINICAL DATA:  Mid abdominal pain. History of pancreatitis and obstruction. History of gastric bypass surgery.  Hernia repair. Splenectomy and right oophorectomy. EXAM: CT ABDOMEN AND PELVIS WITH CONTRAST TECHNIQUE: Multidetector CT imaging of the abdomen and pelvis was performed using the standard protocol following bolus administration of intravenous contrast. CONTRAST:  OMNIPAQUE IOHEXOL 300 MG/ML  SOLN COMPARISON:  None. FINDINGS: Lower chest: No acute abnormality. Hepatobiliary: The patient is status post cholecystectomy. Mild intra and extrahepatic biliary dilatation is likely due to previous cholecystectomy. No liver masses. The portal vein and its branches are patent. Pancreas: No pancreatic mass or evidence of acute pancreatitis. Spleen: The patient is status post splenectomy. A small amount of splenic tissue appears to remain in the left upper quadrant. Adrenals/Urinary Tract: Adrenal glands are unremarkable. Kidneys are normal, without renal calculi, focal lesion, or hydronephrosis. Bladder is unremarkable. Stomach/Bowel: The patient is status post gastric bypass. The postoperative stomach is unremarkable. The small bowel is nondistended with no small bowel obstruction identified. Moderate to severe fecal loading is seen throughout the colon. The appendix is prominent caliber measuring up to 11 mm proximally. It is much smaller in caliber distally. There is no adjacent periappendiceal stranding. Vascular/Lymphatic: No significant vascular findings are present. No enlarged abdominal or pelvic lymph nodes. Reproductive: Status post hysterectomy. No adnexal masses. Other: No abdominal wall hernia or abnormality. No abdominopelvic ascites. Musculoskeletal: No acute or significant osseous findings. IMPRESSION: 1. The appendix is prominent in caliber along most of its length measuring up to 11 mm. However, there is no adjacent stranding. Also, the patient's white blood cell count is normal. Despite the prominence in caliber, suspicion for appendicitis is low based on today's imaging due to the lack of adjacent  stranding and the lack of an elevated white blood cell count. Recommend clinical correlation and follow-up if clinically warranted. 2. Status post gastric bypass.  No small bowel obstruction. 3. Severe fecal loading throughout the length of the colon. 4. Intra and extrahepatic biliary duct dilatation is likely from previous cholecystectomy. Of note, the patient's bilirubin is normal today. 5. Previous splenectomy with some splenules remaining in  the left upper quadrant. Electronically Signed   By: Gerome Samavid  Williams III M.D   On: 07/11/2018 23:17    Pending Labs Unresulted Labs (From admission, onward)    Start     Ordered   Signed and Held  Creatinine, serum  (enoxaparin (LOVENOX)    CrCl >/= 30 ml/min)  Weekly,   R    Comments:  while on enoxaparin therapy    Signed and Held   Signed and Held  TSH  Add-on,   R     Signed and Held   Signed and Held  Hemoglobin A1c  Add-on,   R     Signed and Held          Vitals/Pain Today's Vitals   07/12/18 0045 07/12/18 0100 07/12/18 0100 07/12/18 0230  BP:   135/80 138/78  Pulse: 77  78 81  Resp:   18 17  Temp:      TempSrc:      SpO2: 97%  98% 99%  Weight:      Height:      PainSc:  4       Isolation Precautions No active isolations  Medications Medications  sodium chloride flush (NS) 0.9 % injection 3 mL (3 mLs Intravenous Not Given 07/12/18 0101)  HYDROmorphone (DILAUDID) injection 1 mg (1 mg Intravenous Given 07/11/18 2230)  ondansetron (ZOFRAN) injection 4 mg (4 mg Intravenous Given 07/11/18 2229)  sodium chloride 0.9 % bolus 1,000 mL (1,000 mLs Intravenous Bolus 07/11/18 2230)  iohexol (OMNIPAQUE) 300 MG/ML solution 100 mL (100 mLs Intravenous Contrast Given 07/11/18 2248)  HYDROmorphone (DILAUDID) injection 1 mg (1 mg Intravenous Given 07/12/18 0031)    Mobility walks Low fall risk   Focused Assessments GI   R Recommendations: See Admitting Provider Note  Report given to:   Additional Notes:

## 2018-07-12 NOTE — Consult Note (Signed)
[facility] SURGICAL CONSULTATION NOTE (initial) - cpt: 16109: [address]253   HISTORY OF PRESENT ILLNESS (HPI):  53 y.o. female presented to [facility] ED yesterday for evaluation of abdominal pain. Patient reports about a 2 day history of worsening epigastric abdominal pain which she described as intermittently crampy but has progressed to a more constant sharp pain. She has associated nausea with the pain. No complaints of fever, chills, emesis, CP, SOB, or bladder changes. She notes her last BM was about 2 weeks ago. She does endorse a history of constipation in the past and is on lactulose for this, which has not helped in the last few days. She does endorse a history of similar presentation in which she was diagnosed with pancreatitis and/or SBO. Work up in the ED was concerning for significant stool burden and an appendix which was about 11 mm in diameter however without stranding, inflammation, or periappendiceal fluid.   Surgery is consulted by  physician Dr. Joycelyn RuaMichael Diamond, MD in this context for evaluation and management of abdominal pain and 11 mm appendix with surrounding inflammation, stranding, or fluid.   PAST MEDICAL HISTORY (PMH):  Past Medical History:  Diagnosis Date  . Gastric bypass status for obesity   . H/O unstable angina   . Hypoglycemia   . MI (myocardial infarction) (HCC)   . Orthostatic hypotension   . Osteoporosis   . Sciatic nerve disease   . Thyroid disease      PAST SURGICAL HISTORY (PSH):  Past Surgical History:  Procedure Laterality Date  . ABDOMINAL ADHESION SURGERY    . ABDOMINAL HYSTERECTOMY    . CHOLECYSTECTOMY    . DILATION AND CURETTAGE OF UTERUS    . GASTRIC BYPASS    . GASTRIC BYPASS OPEN     revision  . GASTROSTOMY W/ FEEDING TUBE    . HERNIA REPAIR    . OOPHORECTOMY    . SPLENECTOMY    . TONSILLECTOMY       MEDICATIONS:  Prior to Admission medications   Medication Sig Start Date End Date Taking? Authorizing Provider  acarbose  (PRECOSE) 100 MG tablet Take 100 mg by mouth 3 (three) times daily. With meals 06/29/18  Yes [provider]  alendronate (FOSAMAX) 70 MG tablet Take 70 mg by mouth once a week. 02/19/18  Yes [provider]  ARIPiprazole ER (ABILIFY MAINTENA) 400 MG PRSY prefilled syringe Inject 400 mg into the muscle every 30 (thirty) days. 01/26/18  Yes [provider]  butalbital-acetaminophen-caffeine (FIORICET) 50-325-40 MG tablet Take 1 tablet by mouth every 4 (four) hours as needed for pain.   Yes [provider]  butalbital-aspirin-caffeine Benny Lennert(FIORINAL) 50-325-40 MG capsule Take 1 capsule by mouth every 6 (six) hours as needed for headache.   Yes [provider]  Calcium Carb-Cholecalciferol (CALTRATE 600+D3) 600-800 MG-UNIT TABS Take 1 tablet by mouth 2 (two) times daily. 01/26/18  Yes [provider]  cetirizine (ZYRTEC) 10 MG tablet Take 10 mg by mouth daily. 04/29/18  Yes [provider]  cyanocobalamin (,VITAMIN B-12,) 1000 MCG/ML injection Inject 1,000 mcg into the muscle every 30 (thirty) days. 02/25/18  Yes [provider]  ergocalciferol (VITAMIN D2) 1.25 MG (50000 UT) capsule Take 1.25 mcg by mouth every 30 (thirty) days. 02/08/18  Yes [provider]  ferrous sulfate 325 (65 FE) MG tablet Take 325 mg by mouth 2 (two) times daily with a meal.   Yes [provider]  FYAVOLV 0.5-2.5 MG-MCG tablet Take 1 tablet by mouth daily.  02/19/18  Yes [provider]  gabapentin (NEURONTIN) 400 MG capsule Take 1 capsule (400 mg total) by mouth 3 (three) times daily for 30 days. 06/28/18 07/28/18 Yes King, Shana Chute, NP  Galcanezumab-gnlm 120 MG/ML SOAJ Inject 120 mg into the skin every 30 (thirty) days. 06/28/18  Yes [provider]  isosorbide dinitrate (ISORDIL) 30 MG tablet Take 30 mg by mouth daily.   Yes [provider]  lactulose (CHRONULAC) 10 GM/15ML solution Take 15 mLs by mouth as needed. 01/26/18   Yes [provider]  levothyroxine (LEVOXYL) 88 MCG tablet Take 88 mcg by mouth daily. 01/26/18  Yes [provider]  nitroGLYCERIN (NITROSTAT) 0.4 MG SL tablet Place 0.4 mg under the tongue as needed. 01/26/18  Yes [provider]  ondansetron (ZOFRAN) 8 MG tablet Take 8 mg by mouth as needed. 01/26/18  Yes [provider]  Oxcarbazepine (TRILEPTAL) 300 MG tablet Take 300 mg by mouth 2 (two) times daily. 01/26/18  Yes [provider]  QUEtiapine (SEROQUEL) 50 MG tablet Take 50 mg by mouth at bedtime. 03/15/18  Yes [provider]  ranolazine (RANEXA) 500 MG 12 hr tablet Take 500 mg by mouth 2 (two) times daily.   Yes [provider]  rosuvastatin (CRESTOR) 10 MG tablet TK 1 T PO QD 06/09/18  Yes [provider]  rosuvastatin (CRESTOR) 5 MG tablet Take 5 mg by mouth daily. 02/08/18  Yes [provider]  SUMAtriptan (IMITREX) 100 MG tablet Take 100 mg by mouth as needed. 01/26/18  Yes [provider]  tiZANidine (ZANAFLEX) 4 MG tablet Take 1 tablet (4 mg total) by mouth 3 (three) times daily. 05/31/18 08/29/18 Yes Barbette Merino, NP  traMADol (ULTRAM) 50 MG tablet Take 1 tablet (50 mg total) by mouth every 12 (twelve) hours as needed for up to 30 days for severe pain. Month last 30 days. 07/14/18 08/13/18 Yes King, Shana Chute, NP  zolpidem (AMBIEN) 5 MG tablet Take 5 mg by mouth at bedtime. 07/09/18 08/08/18 Yes [provider]     ALLERGIES:  Allergies  Allergen Reactions  . Depakote [Divalproex Sodium] Shortness Of Breath  . Pregabalin Rash and Other (See Comments)    Other Reaction: falls; decreased coordination  . Valproic Acid Other (See Comments)    Other Reaction: Other reaction  . Levetiracetam Rash and Itching     SOCIAL HISTORY:  Social History   Socioeconomic History  . Marital status: Single    Spouse name: Not on file  . Number of children: Not on file  . Years of education: Not on file   . Highest education level: Not on file  Occupational History  . Not on file  Social Needs  . Financial resource strain: Not on file  . Food insecurity:    Worry: Not on file    Inability: Not on file  . Transportation needs:    Medical: Not on file    Non-medical: Not on file  Tobacco Use  . Smoking status: Never Smoker  . Smokeless tobacco: Never Used  Substance and Sexual Activity  . Alcohol use: Not Currently  . Drug use: Never  . Sexual activity: Not on file  Lifestyle  . Physical activity:    Days per week: Not on file    Minutes per session: Not on file  . Stress: Not on file  Relationships  . Social connections:    Talks on phone: Not on file    Gets together:  Not on file    Attends religious service: Not on file    Active member of club or organization: Not on file    Attends meetings of clubs or organizations: Not on file    Relationship status: Not on file  . Intimate partner violence:    Fear of current or ex partner: Not on file    Emotionally abused: Not on file    Physically abused: Not on file    Forced sexual activity: Not on file  Other Topics Concern  . Not on file  Social History Narrative  . Not on file     FAMILY HISTORY:  Family History  Problem Relation Age of Onset  . Cancer Mother   . Hypertension Mother   . COPD Mother   . Asthma Sister   . Psoriasis Sister   . Epilepsy Brother       REVIEW OF SYSTEMS:  Review of Systems  Constitutional: Negative for chills and fever.  HENT: Negative for congestion and sore throat.   Respiratory: Negative for cough and shortness of breath.   Cardiovascular: Negative for chest pain.  Gastrointestinal: Positive for abdominal pain, constipation and nausea. Negative for vomiting.  Genitourinary: Negative for dysuria and frequency.  Musculoskeletal: Negative for myalgias and neck pain.  Neurological: Negative for dizziness and headaches.  All other systems reviewed and are negative.   VITAL  SIGNS:  Temp:  [98 F (36.7 C)-98.7 F (37.1 C)] 98 F (36.7 C) (05/04 0313) Pulse Rate:  [77-81] 78 (05/04 0313) Resp:  [17-20] 18 (05/04 0313) BP: (131-151)/(78-89) 131/89 (05/04 0313) SpO2:  [97 %-100 %] 100 % (05/04 0313) Weight:  [81.6 kg-85.1 kg] 85.1 kg (05/04 0500)     Height:  (157.5 cm) Weight: 85.1 kg BMI (Calculated): 34.31   INTAKE/OUTPUT:  This shift: No intake/output data recorded.  Last 2 shifts: @   PHYSICAL EXAM:  Physical Exam Vitals signs and nursing note reviewed.  Constitutional:      General: She is not in acute distress.    Appearance: She is well-developed. She is not ill-appearing.     Comments: Patient up walking in room  HENT:     Head: Normocephalic and atraumatic.  Eyes:     General: No scleral icterus.    Extraocular Movements: Extraocular movements intact.  Cardiovascular:     Rate and Rhythm: Normal rate and regular rhythm.     Heart sounds: Normal heart sounds. No murmur. No friction rub. No gallop.   Pulmonary:     Effort: Pulmonary effort is normal. No respiratory distress.     Breath sounds: Normal breath sounds. No wheezing or rhonchi.  Abdominal:     General: Abdomen is flat. A surgical scar is present. There is no distension.     Palpations: Abdomen is soft.     Tenderness: There is generalized abdominal tenderness and tenderness in the epigastric area. There is no guarding or rebound. Negative signs include Murphy's sign, Rovsing's sign, McBurney's sign and psoas sign.     Comments: Abdominal pain somewhat generalized but worse in epigastric region. No evidence of peritonitis. Negative McBurney's, Rovsing's, Psaos Signs. She does have a large well healed midline laparotomy scar  Genitourinary:    Comments: Deferred Skin:    General: Skin is warm and dry.     Coloration: Skin is not jaundiced or pale.  Neurological:     General: No focal deficit present.     Mental Status: She is alert and oriented  to person,  place, and time.  Psychiatric:        Mood and Affect: Mood normal.        Behavior: Behavior normal.       Labs:  CBC Latest Ref Rng & Units 07/12/2018 07/11/2018 04/25/2018  WBC 4.0 - 10.5 K/uL 8.8 9.9 11.5(H)  Hemoglobin 12.0 - 15.0 g/dL 0.9(W) 10.3(L) 9.4(L)  Hematocrit 36.0 - 46.0 % 29.3(L) 32.7(L) 30.6(L)  Platelets 150 - 400 K/uL 551(H) 585(H) 392   CMP Latest Ref Rng & Units 07/11/2018 04/25/2018 03/31/2018  Glucose 70 - 99 mg/dL 119(J) 478(G) 94  BUN 6 - 20 mg/dL Creatinine 0.44 - 1.00 mg/dL 9.56 2.13 0.86  Sodium 135 - 145 mmol/L 130(L) 136 140  Potassium 3.5 - 5.1 mmol/L 3.9 4.0 4.6  Chloride 98 - 111 mmol/L 98 107 104  CO2 22 - 32 mmol/L 21(L) 26 -  Calcium 8.9 - 10.3 mg/dL 8.2(L) 7.9(L) 9.0  Total Protein 6.5 - 8.1 g/dL 7.0 - 6.9  Total Bilirubin 0.3 - 1.2 mg/dL 0.6 - <5.7  Alkaline Phos 38 - 126 U/L 153(H) - 212(H)  AST 15 - 41 U/L 27 - 26  ALT 0 - 44 U/L 22 - -    Imaging studies:   CT Abdomen/Pelvis (07/11/2018) personally reviewed and radiologist report reviewed below. I agree with significant stool burden, appendix is dilated however no stranding or surrounding fluid. IMPRESSION: 1. The appendix is prominent in caliber along most of its length measuring up to 11 mm. However, there is no adjacent stranding. Also, the patient's white blood cell count is normal. Despite the prominence in caliber, suspicion for appendicitis is low based on today's imaging due to the lack of adjacent stranding and the lack of an elevated white blood cell count. Recommend clinical correlation and follow-up if clinically warranted. 2. Status post gastric bypass.  No small bowel obstruction. 3. Severe fecal loading throughout the length of the colon. 4. Intra and extrahepatic biliary duct dilatation is likely from previous cholecystectomy. Of note, the patient's bilirubin is normal today. 5. Previous splenectomy with some splenules remaining in the left upper  quadrant.    Assessment/Plan: (ICD-10's: R10.9) 53 y.o. female with epigastric > RLQ abdominal pain most likely attributable to severe constipation rather than leukocytosis given her lack of WBC without ABx intervention, complicated by pertinent comorbidities including a history of chronic constipation management with lactulose only.   - Okay for clear liquids today   - No indication for IV Abx  - pain control prn  - Agree with plan for enema  - Patient will benefit from BID stool softener (Colace) while hospitalized as well as an outpatient   - Discussed the importance of fiber and hydration in managing chronic constipation  - Re-check WBC in the morning; if remains normal unlikely appendicitis and will be okay to advance diet   - Further management per primary team  All of the above findings and recommendations were discussed with the patient, and all of patient's questions were answered to her expressed satisfaction.  Thank you for the opportunity to participate in this patient's care.   -- Lynden Oxford, PA-C  [facility] 07/12/2018, 9:52 AM [phone] M-F: 7am - 4pm

## 2018-07-12 NOTE — H&P (Addendum)
Diana Quinn is an 53 y.o. female.   Chief Complaint: Abdominal pain HPI: The patient with past medical history of heart disease status post MI, hypothyroidism and multiple abdominal surgeries presents to the emergency department complaining of abdominal pain.  The patient reports that her pain began approximately 36 hours ago.  She admits to associated nausea but denies vomiting.  The patient also admits that it has been 2 weeks since her last bowel movement.  She has had pancreatitis in the past but reports that her pain is different this time although she points to her epigastric area as the primary focus of pain.  Her left upper quadrant does not hurt but the rest of her abdomen is uncomfortable.  CT of her abdomen did not demonstrate pancreatitis however there is a concern that she may have subacute appendicitis.  After multiple doses of analgesia and antiemetic medication the patient continued to have abdominal pain which prompted the emergency department staff to call the hospitalist service for admission.  Past Medical History:  Diagnosis Date  . Gastric bypass status for obesity   . H/O unstable angina   . Hypoglycemia   . MI (myocardial infarction) (HCC)   . Orthostatic hypotension   . Osteoporosis   . Sciatic nerve disease   . Thyroid disease     Past Surgical History:  Procedure Laterality Date  . ABDOMINAL ADHESION SURGERY    . ABDOMINAL HYSTERECTOMY    . CHOLECYSTECTOMY    . DILATION AND CURETTAGE OF UTERUS    . GASTRIC BYPASS    . GASTRIC BYPASS OPEN     revision  . GASTROSTOMY W/ FEEDING TUBE    . HERNIA REPAIR    . OOPHORECTOMY    . SPLENECTOMY    . TONSILLECTOMY      Family History  Problem Relation Age of Onset  . Cancer Mother   . Hypertension Mother   . COPD Mother   . Asthma Sister   . Psoriasis Sister   . Epilepsy Brother    Social History:  reports that she has never smoked. She has never used smokeless tobacco. She reports previous alcohol use. She  reports that she does not use drugs.  Allergies:  Allergies  Allergen Reactions  . Depakote [Divalproex Sodium] Shortness Of Breath  . Pregabalin Rash and Other (See Comments)    Other Reaction: falls; decreased coordination  . Valproic Acid Other (See Comments)    Other Reaction: Other reaction  . Levetiracetam Rash and Itching    Medications Prior to Admission  Medication Sig Dispense Refill  . acarbose (PRECOSE) 100 MG tablet Take 100 mg by mouth 3 (three) times daily. With meals    . alendronate (FOSAMAX) 70 MG tablet Take 70 mg by mouth once a week.    . ARIPiprazole ER (ABILIFY MAINTENA) 400 MG PRSY prefilled syringe Inject 400 mg into the muscle every 30 (thirty) days.    . butalbital-acetaminophen-caffeine (FIORICET) 50-325-40 MG tablet Take 1 tablet by mouth every 4 (four) hours as needed for pain.    . butalbital-aspirin-caffeine (FIORINAL) 50-325-40 MG capsule Take 1 capsule by mouth every 6 (six) hours as needed for headache.    . Calcium Carb-Cholecalciferol (CALTRATE 600+D3) 600-800 MG-UNIT TABS Take 1 tablet by mouth 2 (two) times daily.    . cetirizine (ZYRTEC) 10 MG tablet Take 10 mg by mouth daily.    . cyanocobalamin (,VITAMIN B-12,) 1000 MCG/ML injection Inject 1,000 mcg into the muscle every 30 (thirty) days.    Marland Kitchen  ergocalciferol (VITAMIN D2) 1.25 MG (50000 UT) capsule Take 1.25 mcg by mouth every 30 (thirty) days.    . ferrous sulfate 325 (65 FE) MG tablet Take 325 mg by mouth 2 (two) times daily with a meal.    . FYAVOLV 0.5-2.5 MG-MCG tablet Take 1 tablet by mouth daily.    Marland Kitchen gabapentin (NEURONTIN) 400 MG capsule Take 1 capsule (400 mg total) by mouth 3 (three) times daily for 30 days. 90 capsule 0  . Galcanezumab-gnlm 120 MG/ML SOAJ Inject 120 mg into the skin every 30 (thirty) days.    . isosorbide dinitrate (ISORDIL) 30 MG tablet Take 30 mg by mouth daily.    Marland Kitchen lactulose (CHRONULAC) 10 GM/15ML solution Take 15 mLs by mouth as needed.    Marland Kitchen levothyroxine  (LEVOXYL) 88 MCG tablet Take 88 mcg by mouth daily.    . nitroGLYCERIN (NITROSTAT) 0.4 MG SL tablet Place 0.4 mg under the tongue as needed.    . ondansetron (ZOFRAN) 8 MG tablet Take 8 mg by mouth as needed.    . Oxcarbazepine (TRILEPTAL) 300 MG tablet Take 300 mg by mouth 2 (two) times daily.    . QUEtiapine (SEROQUEL) 50 MG tablet Take 50 mg by mouth at bedtime.    . ranolazine (RANEXA) 500 MG 12 hr tablet Take 500 mg by mouth 2 (two) times daily.    . rosuvastatin (CRESTOR) 10 MG tablet TK 1 T PO QD    . rosuvastatin (CRESTOR) 5 MG tablet Take 5 mg by mouth daily.    . SUMAtriptan (IMITREX) 100 MG tablet Take 100 mg by mouth as needed.    Marland Kitchen tiZANidine (ZANAFLEX) 4 MG tablet Take 1 tablet (4 mg total) by mouth 3 (three) times daily. 90 tablet 2  . [START ON 07/14/2018] traMADol (ULTRAM) 50 MG tablet Take 1 tablet (50 mg total) by mouth every 12 (twelve) hours as needed for up to 30 days for severe pain. Month last 30 days. 60 tablet 0  . zolpidem (AMBIEN) 5 MG tablet Take 5 mg by mouth at bedtime.      Results for orders placed or performed during the hospital encounter of 07/11/18 (from the past 48 hour(s))  Lipase, blood     Status: None   Collection Time: 07/11/18  8:45 PM  Result Value Ref Range   Lipase 22 11 - 51 U/L    Comment: Performed at [facility], [address] [place], [place] [address]  Comprehensive metabolic panel     Status: Abnormal   Collection Time: 07/11/18  8:45 PM  Result Value Ref Range   Sodium 130 (L) 135 - 145 mmol/L   Potassium 3.9 3.5 - 5.1 mmol/L   Chloride 98 98 - 111 mmol/L   CO2 21 (L) 22 - 32 mmol/L   Glucose, Bld 109 (H) 70 - 99 mg/dL   BUN 9 6 - 20 mg/dL   Creatinine, Ser 8.93 0.44 - 1.00 mg/dL   Calcium 8.2 (L) 8.9 - 10.3 mg/dL   Total Protein 7.0 6.5 - 8.1 g/dL   Albumin 4.1 3.5 - 5.0 g/dL   AST 27 15 - 41 U/L   ALT 22 0 - 44 U/L   Alkaline Phosphatase 153 (H) 38 - 126 U/L   Total Bilirubin 0.6 0.3 - 1.2 mg/dL   GFR calc non Af  Amer >60 >60 mL/min   GFR calc Af Amer >60 >60 mL/min   Anion gap 11 5 - 15    Comment:  Performed at [facility], [address] [place], [place] [address]  CBC     Status: Abnormal   Collection Time: 07/11/18  8:45 PM  Result Value Ref Range   WBC 9.9 4.0 - 10.5 K/uL   RBC 4.13 3.87 - 5.11 MIL/uL   Hemoglobin 10.3 (L) 12.0 - 15.0 g/dL   HCT 60.4 (L) 54.0 - 98.1 %   MCV 79.2 (L) 80.0 - 100.0 fL   MCH 24.9 (L) 26.0 - 34.0 pg   MCHC 31.5 30.0 - 36.0 g/dL   RDW 19.1 (H) 47.8 - 29.5 %   Platelets 585 (H) 150 - 400 K/uL   nRBC 0.0 0.0 - 0.2 %    Comment: Performed at [facility], [address] [place], [place] [address]  Urinalysis, Complete w Microscopic     Status: Abnormal   Collection Time: 07/11/18 10:03 PM  Result Value Ref Range   Color, Urine YELLOW (A) YELLOW   APPearance HAZY (A) CLEAR   Specific Gravity, Urine 1.021 1.005 - 1.030   pH 5.0 5.0 - 8.0   Glucose, UA NEGATIVE NEGATIVE mg/dL   Hgb urine dipstick NEGATIVE NEGATIVE   Bilirubin Urine NEGATIVE NEGATIVE   Ketones, ur NEGATIVE NEGATIVE mg/dL   Protein, ur NEGATIVE NEGATIVE mg/dL   Nitrite NEGATIVE NEGATIVE   Leukocytes,Ua TRACE (A) NEGATIVE   RBC / HPF 0-5 0 - 5 RBC/hpf   WBC, UA 0-5 0 - 5 WBC/hpf   Bacteria, UA NONE SEEN NONE SEEN   Squamous Epithelial / LPF 6-10 0 - 5   Mucus PRESENT     Comment: Performed at [facility], [address] [place], [place] [address]  SARS Coronavirus 2 (CEPHEID - Performed in [facility] hospital lab), Hosp Order     Status: None   Collection Time: 07/12/18 12:29 AM  Result Value Ref Range   SARS Coronavirus 2 NEGATIVE NEGATIVE    Comment: (NOTE) If result is NEGATIVE SARS-CoV-2 target nucleic acids are NOT DETECTED. The SARS-CoV-2 RNA is generally detectable in upper and lower  respiratory specimens during the acute phase of infection. The lowest  concentration of SARS-CoV-2 viral copies this assay can detect is 250  copies / mL. A  negative result does not preclude SARS-CoV-2 infection  and should not be used as the sole basis for treatment or other  patient management decisions.  A negative result may occur with  improper specimen collection / handling, submission of specimen other  than nasopharyngeal swab, presence of viral mutation(s) within the  areas targeted by this assay, and inadequate number of viral copies  (<250 copies / mL). A negative result must be combined with clinical  observations, patient history, and epidemiological information. If result is POSITIVE SARS-CoV-2 target nucleic acids are DETECTED. The SARS-CoV-2 RNA is generally detectable in upper and lower  respiratory specimens dur ing the acute phase of infection.  Positive  results are indicative of active infection with SARS-CoV-2.  Clinical  correlation with patient history and other diagnostic information is  necessary to determine patient infection status.  Positive results do  not rule out bacterial infection or co-infection with other viruses. If result is PRESUMPTIVE POSTIVE SARS-CoV-2 nucleic acids MAY BE PRESENT.   A presumptive positive result was obtained on the submitted specimen  and confirmed on repeat testing.  While 2019 novel coronavirus  (SARS-CoV-2) nucleic acids may be present in the submitted sample  additional confirmatory testing may be necessary for epidemiological  and /  or clinical management purposes  to differentiate between  SARS-CoV-2 and other Sarbecovirus currently known to infect humans.  If clinically indicated additional testing with an alternate test  methodology [id number]) is advised. The SARS-CoV-2 RNA is generally  detectable in upper and lower respiratory sp ecimens during the acute  phase of infection. The expected result is Negative. Fact Sheet for Patients:  BoilerBrush.com.cy Fact Sheet for Healthcare Providers: https://pope.com/ This test is not  yet approved or cleared by the [place] FDA and has been authorized for detection and/or diagnosis of SARS-CoV-2 by FDA under an Emergency Use Authorization (EUA).  This EUA will remain in effect (meaning this test can be used) for the duration of the COVID-19 declaration under Section 564(b)(1) of the Act, 21 U.S.C. section 360bbb-3(b)(1), unless the authorization is terminated or revoked sooner. Performed at [facility], [address] [place], [place] [address]    Ct Abdomen Pelvis W Contrast  Result Date: 07/11/2018 CLINICAL DATA:  Mid abdominal pain. History of pancreatitis and obstruction. History of gastric bypass surgery. Hernia repair. Splenectomy and right oophorectomy. EXAM: CT ABDOMEN AND PELVIS WITH CONTRAST TECHNIQUE: Multidetector CT imaging of the abdomen and pelvis was performed using the standard protocol following bolus administration of intravenous contrast. CONTRAST:  OMNIPAQUE IOHEXOL 300 MG/ML  SOLN COMPARISON:  None. FINDINGS: Lower chest: No acute abnormality. Hepatobiliary: The patient is status post cholecystectomy. Mild intra and extrahepatic biliary dilatation is likely due to previous cholecystectomy. No liver masses. The portal vein and its branches are patent. Pancreas: No pancreatic mass or evidence of acute pancreatitis. Spleen: The patient is status post splenectomy. A small amount of splenic tissue appears to remain in the left upper quadrant. Adrenals/Urinary Tract: Adrenal glands are unremarkable. Kidneys are normal, without renal calculi, focal lesion, or hydronephrosis. Bladder is unremarkable. Stomach/Bowel: The patient is status post gastric bypass. The postoperative stomach is unremarkable. The small bowel is nondistended with no small bowel obstruction identified. Moderate to severe fecal loading is seen throughout the colon. The appendix is prominent caliber measuring up to 11 mm proximally. It is much smaller in caliber distally. There  is no adjacent periappendiceal stranding. Vascular/Lymphatic: No significant vascular findings are present. No enlarged abdominal or pelvic lymph nodes. Reproductive: Status post hysterectomy. No adnexal masses. Other: No abdominal wall hernia or abnormality. No abdominopelvic ascites. Musculoskeletal: No acute or significant osseous findings. IMPRESSION: 1. The appendix is prominent in caliber along most of its length measuring up to 11 mm. However, there is no adjacent stranding. Also, the patient's white blood cell count is normal. Despite the prominence in caliber, suspicion for appendicitis is low based on today's imaging due to the lack of adjacent stranding and the lack of an elevated white blood cell count. Recommend clinical correlation and follow-up if clinically warranted. 2. Status post gastric bypass.  No small bowel obstruction. 3. Severe fecal loading throughout the length of the colon. 4. Intra and extrahepatic biliary duct dilatation is likely from previous cholecystectomy. Of note, the patient's bilirubin is normal today. 5. 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 Electronically Signed   By: Gerome Sam III M.D   On: 07/11/2018 23:17    Review of Systems  Constitutional: Negative for chills and fever.  HENT: Negative for sore throat and tinnitus.   Eyes: Negative for blurred vision and redness.  Respiratory: Negative for cough and shortness of breath.   Cardiovascular: Negative for chest pain, palpitations, orthopnea and PND.  Gastrointestinal:  Positive for abdominal pain, constipation and nausea. Negative for diarrhea and vomiting.  Genitourinary: Negative for dysuria, frequency and urgency.  Musculoskeletal: Negative for joint pain and myalgias.  Skin: Negative for rash.       No lesions  Neurological: Negative for speech change, focal weakness and weakness.  Endo/Heme/Allergies: Does not bruise/bleed easily.       No temperature  intolerance  Psychiatric/Behavioral: Negative for depression and suicidal ideas.    Blood pressure 135/80, pulse 78, temperature 98.7 F (37.1 C), temperature source Oral, resp. rate 18, height  (1.575 m), weight 81.6 kg, SpO2 98 %. Physical Exam  Vitals reviewed. Constitutional: She is oriented to person, place, and time. She appears well-developed and well-nourished. No distress.  HENT:  Head: Normocephalic and atraumatic.  Mouth/Throat: Oropharynx is clear and moist.  Eyes: Pupils are equal, round, and reactive to light. Conjunctivae and EOM are normal. No scleral icterus.  Neck: Normal range of motion. Neck supple. No JVD present. No tracheal deviation present. No thyromegaly present.  Cardiovascular: Normal rate, regular rhythm and normal heart sounds. Exam reveals no gallop and no friction rub.  No murmur heard. Respiratory: Effort normal and breath sounds normal.  GI: Soft. Bowel sounds are normal. She exhibits no distension and no mass. There is abdominal tenderness. There is no rebound and no guarding.  Genitourinary:    Genitourinary Comments: Deferred   Musculoskeletal: Normal range of motion.        General: No edema.  Lymphadenopathy:    She has no cervical adenopathy.  Neurological: She is alert and oriented to person, place, and time. No cranial nerve deficit. She exhibits normal muscle tone.  Skin: Skin is warm and dry. No rash noted. No erythema.  Psychiatric: She has a normal mood and affect. Her behavior is normal. Judgment and thought content normal.     Assessment/Plan This is a 53 year old female admitted for abdominal pain. 1.  Intractable abdominal pain: Differential diagnosis is broad.  Laboratory evaluation does not indicate pancreatitis or hepatitis.  The patient is status post cholecystectomy as well as splenectomy and gastric bypass, all of which could is old and adhesions.  Notably the patient has had adhesion lysis operation in the past.  She also  has a large fecal burden present on imaging and can simply have pain and nausea associated with severe constipation.  Past medical history of  hypothyroidism, iron supplementation and vitamin D could all  contribute to constipation.  She takes lactulose at home for constipation and I will increase dosing and frequency while hospitalized.  Continue to monitor.  The patient is received IV morphine for severe pain but prefers Percocet.  I will change her hospital regimen for analgesia.  Symptomatic care of nausea.  Surgical service to consult in the morning. 2.  Hyponatremia: Mild; euvolemic.  Likely secondary to poor p.o. intake.  Encourage healthy diet.  Supplement with normal saline if needed. 3.  CAD: Stable; continue Ranexa.  The patient is not on aspirin and should be if she does not have allergy. 4.  Hypothyroidism: TSH within normal limits.  Continue Synthroid. 5.  Diabetes type II: Sliding scale insulin while hospitalized.  Hold oral hypoglycemic agents 6.  Epilepsy: Trolled; continue Trileptal 7.  Hyperlipidemia: Continue statin therapy 8.  Migraine headaches: Controlled; sumatriptan as needed 9.  DVT prophylaxis: Lovenox 10.  GI prophylaxis: None The patient is a full code.  Time spent on admission orders and patient care approximately 45 minutes   Sheryle Hail,  Kelton PillarMichael S, MD 07/12/2018, 2:28 AM

## 2018-07-13 DIAGNOSIS — Z9081 Acquired absence of spleen: Secondary | ICD-10-CM | POA: Diagnosis not present

## 2018-07-13 DIAGNOSIS — I252 Old myocardial infarction: Secondary | ICD-10-CM | POA: Diagnosis not present

## 2018-07-13 DIAGNOSIS — E119 Type 2 diabetes mellitus without complications: Secondary | ICD-10-CM | POA: Diagnosis present

## 2018-07-13 DIAGNOSIS — Z79899 Other long term (current) drug therapy: Secondary | ICD-10-CM | POA: Diagnosis not present

## 2018-07-13 DIAGNOSIS — R1084 Generalized abdominal pain: Secondary | ICD-10-CM | POA: Diagnosis not present

## 2018-07-13 DIAGNOSIS — Z825 Family history of asthma and other chronic lower respiratory diseases: Secondary | ICD-10-CM | POA: Diagnosis not present

## 2018-07-13 DIAGNOSIS — M810 Age-related osteoporosis without current pathological fracture: Secondary | ICD-10-CM | POA: Diagnosis present

## 2018-07-13 DIAGNOSIS — Z1159 Encounter for screening for other viral diseases: Secondary | ICD-10-CM | POA: Diagnosis not present

## 2018-07-13 DIAGNOSIS — Z90710 Acquired absence of both cervix and uterus: Secondary | ICD-10-CM | POA: Diagnosis not present

## 2018-07-13 DIAGNOSIS — Z8249 Family history of ischemic heart disease and other diseases of the circulatory system: Secondary | ICD-10-CM | POA: Diagnosis not present

## 2018-07-13 DIAGNOSIS — I2510 Atherosclerotic heart disease of native coronary artery without angina pectoris: Secondary | ICD-10-CM | POA: Diagnosis present

## 2018-07-13 DIAGNOSIS — G43909 Migraine, unspecified, not intractable, without status migrainosus: Secondary | ICD-10-CM | POA: Diagnosis present

## 2018-07-13 DIAGNOSIS — Z79891 Long term (current) use of opiate analgesic: Secondary | ICD-10-CM | POA: Diagnosis not present

## 2018-07-13 DIAGNOSIS — F209 Schizophrenia, unspecified: Secondary | ICD-10-CM | POA: Diagnosis present

## 2018-07-13 DIAGNOSIS — E039 Hypothyroidism, unspecified: Secondary | ICD-10-CM | POA: Diagnosis present

## 2018-07-13 DIAGNOSIS — Z9884 Bariatric surgery status: Secondary | ICD-10-CM | POA: Diagnosis not present

## 2018-07-13 DIAGNOSIS — K5909 Other constipation: Secondary | ICD-10-CM | POA: Diagnosis present

## 2018-07-13 DIAGNOSIS — Z9049 Acquired absence of other specified parts of digestive tract: Secondary | ICD-10-CM | POA: Diagnosis not present

## 2018-07-13 DIAGNOSIS — Z79890 Hormone replacement therapy (postmenopausal): Secondary | ICD-10-CM | POA: Diagnosis not present

## 2018-07-13 DIAGNOSIS — R1013 Epigastric pain: Secondary | ICD-10-CM | POA: Diagnosis present

## 2018-07-13 DIAGNOSIS — E871 Hypo-osmolality and hyponatremia: Secondary | ICD-10-CM | POA: Diagnosis present

## 2018-07-13 DIAGNOSIS — G894 Chronic pain syndrome: Secondary | ICD-10-CM | POA: Diagnosis present

## 2018-07-13 DIAGNOSIS — D649 Anemia, unspecified: Secondary | ICD-10-CM | POA: Diagnosis present

## 2018-07-13 DIAGNOSIS — Z7983 Long term (current) use of bisphosphonates: Secondary | ICD-10-CM | POA: Diagnosis not present

## 2018-07-13 DIAGNOSIS — E785 Hyperlipidemia, unspecified: Secondary | ICD-10-CM | POA: Diagnosis present

## 2018-07-13 DIAGNOSIS — G40909 Epilepsy, unspecified, not intractable, without status epilepticus: Secondary | ICD-10-CM | POA: Diagnosis present

## 2018-07-13 LAB — CBC WITH DIFFERENTIAL/PLATELET
Abs Immature Granulocytes: 0.01 10*3/uL (ref 0.00–0.07)
Basophils Absolute: 0.1 10*3/uL (ref 0.0–0.1)
Basophils Relative: 1 %
Eosinophils Absolute: 0.4 10*3/uL (ref 0.0–0.5)
Eosinophils Relative: 6 %
HCT: 30.2 % — ABNORMAL LOW (ref 36.0–46.0)
Hemoglobin: 9.2 g/dL — ABNORMAL LOW (ref 12.0–15.0)
Immature Granulocytes: 0 %
Lymphocytes Relative: 51 %
Lymphs Abs: 3 10*3/uL (ref 0.7–4.0)
MCH: 24.9 pg — ABNORMAL LOW (ref 26.0–34.0)
MCHC: 30.5 g/dL (ref 30.0–36.0)
MCV: 81.8 fL (ref 80.0–100.0)
Monocytes Absolute: 0.9 10*3/uL (ref 0.1–1.0)
Monocytes Relative: 16 %
Neutro Abs: 1.5 10*3/uL — ABNORMAL LOW (ref 1.7–7.7)
Neutrophils Relative %: 26 %
Platelets: 539 10*3/uL — ABNORMAL HIGH (ref 150–400)
RBC: 3.69 MIL/uL — ABNORMAL LOW (ref 3.87–5.11)
RDW: 18.5 % — ABNORMAL HIGH (ref 11.5–15.5)
WBC: 5.8 10*3/uL (ref 4.0–10.5)
nRBC: 0 % (ref 0.0–0.2)

## 2018-07-13 LAB — BASIC METABOLIC PANEL
Anion gap: 5 (ref 5–15)
BUN: <5 mg/dL — ABNORMAL LOW (ref 6–20)
CO2: 25 mmol/L (ref 22–32)
Calcium: 8.1 mg/dL — ABNORMAL LOW (ref 8.9–10.3)
Chloride: 110 mmol/L (ref 98–111)
Creatinine, Ser: 0.48 mg/dL (ref 0.44–1.00)
GFR calc Af Amer: >60 mL/min (ref >60)
GFR calc non Af Amer: >60 mL/min (ref >60)
Glucose, Bld: 98 mg/dL (ref 70–99)
Potassium: 3.8 mmol/L (ref 3.5–5.1)
Sodium: 140 mmol/L (ref 135–145)

## 2018-07-13 LAB — GLUCOSE, CAPILLARY
Glucose-Capillary: 72 mg/dL (ref 70–99)
Glucose-Capillary: 114 mg/dL — ABNORMAL HIGH (ref 70–99)
Glucose-Capillary: 113 mg/dL — ABNORMAL HIGH (ref 70–99)
Glucose-Capillary: 103 mg/dL — ABNORMAL HIGH (ref 70–99)

## 2018-07-13 MED ORDER — OXYCODONE-ACETAMINOPHEN 5-325 MG PO TABS
1.0000 | ORAL_TABLET | Freq: Four times a day (QID) | ORAL | Status: DC | PRN
  Administered 2018-07-13 – 2018-07-16 (×12): 2 via ORAL
  Filled 2018-07-13 (×13): qty 2

## 2018-07-13 MED ORDER — FLEET ENEMA 7-19 GM/118ML RE ENEM
1.0000 | ENEMA | Freq: Once | RECTAL | Status: AC
  Administered 2018-07-13: 1 via RECTAL

## 2018-07-13 MED ORDER — MAGNESIUM CITRATE PO SOLN
1.0000 | Freq: Once | ORAL | Status: AC
  Administered 2018-07-13: 12:00:00 1 via ORAL
  Filled 2018-07-13: qty 296

## 2018-07-13 MED ORDER — TRAMADOL HCL 50 MG PO TABS
50.0000 mg | ORAL_TABLET | Freq: Four times a day (QID) | ORAL | Status: DC | PRN
  Administered 2018-07-13 (×2): 50 mg via ORAL
  Filled 2018-07-13 (×3): qty 1

## 2018-07-13 NOTE — Progress Notes (Signed)
[facility]   PATIENT NAME: Diana Quinn    MR#:  [id number]  DATE OF BIRTH:  Jun 25, 1965  SUBJECTIVE:  CHIEF COMPLAINT:   Chief Complaint  Patient presents with  . Abdominal Pain    mid epigastric   Continues to have abdominal pain.  Had one bowel movement yesterday with enema. Nausea present but no vomiting.  Asking for a diet.  REVIEW OF SYSTEMS:    Review of Systems  Constitutional: Positive for malaise/fatigue. Negative for chills and fever.  HENT: Negative for sore throat.   Eyes: Negative for blurred vision, double vision and pain.  Respiratory: Negative for cough, hemoptysis, shortness of breath and wheezing.   Cardiovascular: Negative for chest pain, palpitations, orthopnea and leg swelling.  Gastrointestinal: Positive for abdominal pain, constipation and nausea. Negative for diarrhea, heartburn and vomiting.  Genitourinary: Negative for dysuria and hematuria.  Musculoskeletal: Negative for back pain and joint pain.  Skin: Negative for rash.  Neurological: Negative for sensory change, speech change, focal weakness and headaches.  Endo/Heme/Allergies: Does not bruise/bleed easily.  Psychiatric/Behavioral: Negative for depression. The patient is not nervous/anxious.     DRUG ALLERGIES:   Allergies  Allergen Reactions  . Depakote [Divalproex Sodium] Shortness Of Breath  . Pregabalin Rash and Other (See Comments)    Other Reaction: falls; decreased coordination  . Valproic Acid Other (See Comments)    Other Reaction: Other reaction  . Levetiracetam Rash and Itching    VITALS:  Blood pressure 108/68, pulse 73, temperature 98 F (36.7 C), temperature source Oral, resp. rate 19, height  (1.575 m), weight 86.2 kg, SpO2 98 %.  PHYSICAL EXAMINATION:   Physical Exam  GENERAL:  53 y.o.-year-old patient lying in the bed.  Distress due to pain EYES: Pupils equal, round, reactive to light and accommodation. No scleral  icterus. Extraocular muscles intact.  HEENT: Head atraumatic, normocephalic. Oropharynx and nasopharynx clear.  NECK:  Supple, no jugular venous distention. No thyroid enlargement, no tenderness.  LUNGS: Normal breath sounds bilaterally, no wheezing, rales, rhonchi. No use of accessory muscles of respiration.  CARDIOVASCULAR: S1, S2 normal. No murmurs, rubs, or gallops.  ABDOMEN: Soft, diffuse tenderness, nondistended. Bowel sounds present. No organomegaly or mass.  EXTREMITIES: No cyanosis, clubbing or edema b/l.    NEUROLOGIC: Cranial nerves II through XII are intact. No focal Motor or sensory deficits b/l.   PSYCHIATRIC: The patient is alert and oriented x 3.  SKIN: No obvious rash, lesion, or ulcer.   LABORATORY PANEL:   CBC Recent Labs  Lab 07/13/18 0434  WBC 5.8  HGB 9.2*  HCT 30.2*  PLT 539*   ------------------------------------------------------------------------------------------------------------------ Chemistries  Recent Labs  Lab 07/11/18 2045 07/13/18 0434  NA 130* 140  K 3.9 3.8  CL 98 110  CO2 21* 25  GLUCOSE 109* 98  BUN 9 <5*  CREATININE 0.59 0.48  CALCIUM 8.2* 8.1*  AST 27  --   ALT 22  --   ALKPHOS 153*  --   BILITOT 0.6  --    ------------------------------------------------------------------------------------------------------------------  Cardiac Enzymes No results for input(s): TROPONINI in the last 168 hours. ------------------------------------------------------------------------------------------------------------------  RADIOLOGY:  Ct Abdomen Pelvis W Contrast  Result Date: 07/11/2018 CLINICAL DATA:  Mid abdominal pain. History of pancreatitis and obstruction. History of gastric bypass surgery. Hernia repair. Splenectomy and right oophorectomy. EXAM: CT ABDOMEN AND PELVIS WITH CONTRAST TECHNIQUE: Multidetector CT imaging of the abdomen and pelvis was performed using the standard protocol following bolus administration  of intravenous  contrast. CONTRAST:  OMNIPAQUE IOHEXOL 300 MG/ML  SOLN COMPARISON:  None. FINDINGS: Lower chest: No acute abnormality. Hepatobiliary: The patient is status post cholecystectomy. Mild intra and extrahepatic biliary dilatation is likely due to previous cholecystectomy. No liver masses. The portal vein and its branches are patent. Pancreas: No pancreatic mass or evidence of acute pancreatitis. Spleen: The patient is status post splenectomy. A small amount of splenic tissue appears to remain in the left upper quadrant. Adrenals/Urinary Tract: Adrenal glands are unremarkable. Kidneys are normal, without renal calculi, focal lesion, or hydronephrosis. Bladder is unremarkable. Stomach/Bowel: The patient is status post gastric bypass. The postoperative stomach is unremarkable. The small bowel is nondistended with no small bowel obstruction identified. Moderate to severe fecal loading is seen throughout the colon. The appendix is prominent caliber measuring up to 11 mm proximally. It is much smaller in caliber distally. There is no adjacent periappendiceal stranding. Vascular/Lymphatic: No significant vascular findings are present. No enlarged abdominal or pelvic lymph nodes. Reproductive: Status post hysterectomy. No adnexal masses. Other: No abdominal wall hernia or abnormality. No abdominopelvic ascites. Musculoskeletal: No acute or significant osseous findings. IMPRESSION: 1. The appendix is prominent in caliber along most of its length measuring up to 11 mm. However, there is no adjacent stranding. Also, the patient's white blood cell count is normal. Despite the prominence in caliber, suspicion for appendicitis is low based on today's imaging due to the lack of adjacent stranding and the lack of an elevated white blood cell count. Recommend clinical correlation and follow-up if clinically warranted. 2. Status post gastric bypass.  No small bowel obstruction. 3. Severe fecal loading throughout the length of the  colon. 4. Intra and extrahepatic biliary duct dilatation is likely from previous cholecystectomy. Of note, the patient's bilirubin is normal today. 5. 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 Electronically Signed   By: Gerome Sam III M.D   On: 07/11/2018 23:17     ASSESSMENT AND PLAN:   This is a 53 year old female admitted for abdominal pain. 1.    Diffuse abdominal pain secondary to severe constipation.  Continues to have pain today.  Had one bowel movement yesterday but severe stool burden on CT scan.  Continues to be symptomatic with pain and nausea.  Will give another enema.  Lactulose will be continued.  Add magnesium citrate one-time dose.  No bowel movements in 2 weeks prior to admission.  There was some concern regarding mild appendicitis but patient is not typical presentation.  Surgical team has weighed him and no antibiotics or surgery advised.  Monitor.  Start soft diet today  Likely discharge tomorrow once patient has more bowel movements  2.  Hyponatremia, hypovolemic Resolved with IV fluids. Stop fluids.  3.  CAD: Stable; continue Ranexa.    4.  Hypothyroidism: TSH within normal limits.  Continue Synthroid.  5.  Diabetes type II: Sliding scale insulin while hospitalized.  Hold oral hypoglycemic agents  6.  Epilepsy: ; continue Trileptal  7.  Hyperlipidemia: Continue statin therapy  8.  Migraine headaches: Controlled; sumatriptan as needed  All the records are reviewed and case discussed with Care Management/Social Workerr. Management plans discussed with the patient, family and they are in agreement.  CODE STATUS: Full code  DVT Prophylaxis: SCDs  TOTAL TIME TAKING CARE OF THIS PATIENT: 35 minutes.   POSSIBLE D/C IN 1-2 DAYS, DEPENDING ON CLINICAL CONDITION.  Orie Fisherman M.D on 07/13/2018 at 10:44 AM  Between  7am to 6pm - Pager - [phone]  After 6pm go to [email or web address] - password [email or web address] [email or web address]  [facility]  Office  [phone]  CC: Primary care physician; Fayrene HelperBoswell, [name] H, NP  Note: This dictation was prepared with Dragon dictation along with smaller phrase technology. Any transcriptional errors that result from this process are unintentional.

## 2018-07-13 NOTE — Consult Note (Signed)
[facility] SURGICAL CONSULTATION NOTE    HISTORY OF PRESENT ILLNESS (HPI):  53 y.o. female presented to [facility] ED  for evaluation of abdominal pain. Patient reports about a 2 day history of worsening epigastric abdominal pain which she described as intermittently crampy but has progressed to a more constant sharp pain. She has associated nausea with the pain. No complaints of fever, chills, emesis, CP, SOB, or bladder changes. She notes her last BM was about 2 weeks ago. She does endorse a history of constipation in the past and is on lactulose for this, which has not helped in the last few days. She does endorse a history of similar presentation in which she was diagnosed with pancreatitis and/or SBO. Work up in the ED was concerning for significant stool burden and an appendix which was about 11 mm in diameter however without stranding, inflammation, or periappendiceal fluid.   Surgery was consulted by  physician Dr. Joycelyn Rua, MD in this context for evaluation and management of abdominal pain and 11 mm appendix with surrounding inflammation, stranding, or fluid.  Interval History: Patient has remained afebrile, with a normal white blood cell count.  She endorses having had a single bowel movement yesterday, following an enema.  She continues to feel distended with some abdominal discomfort.   PAST MEDICAL HISTORY (PMH):  Past Medical History:  Diagnosis Date  . Gastric bypass status for obesity   . H/O unstable angina   . Hypoglycemia   . MI (myocardial infarction) (HCC)   . Orthostatic hypotension   . Osteoporosis   . Sciatic nerve disease   . Thyroid disease      PAST SURGICAL HISTORY (PSH):  Past Surgical History:  Procedure Laterality Date  . ABDOMINAL ADHESION SURGERY    . ABDOMINAL HYSTERECTOMY    . CHOLECYSTECTOMY    . DILATION AND CURETTAGE OF UTERUS    . GASTRIC BYPASS    . GASTRIC BYPASS OPEN     revision  . GASTROSTOMY W/ FEEDING TUBE    .  HERNIA REPAIR    . OOPHORECTOMY    . SPLENECTOMY    . TONSILLECTOMY       MEDICATIONS:  Prior to Admission medications   Medication Sig Start Date End Date Taking? Authorizing Provider  acarbose (PRECOSE) 100 MG tablet Take 100 mg by mouth 3 (three) times daily. With meals 06/29/18  Yes [provider]  alendronate (FOSAMAX) 70 MG tablet Take 70 mg by mouth once a week. 02/19/18  Yes [provider]  ARIPiprazole ER (ABILIFY MAINTENA) 400 MG PRSY prefilled syringe Inject 400 mg into the muscle every 30 (thirty) days. 01/26/18  Yes [provider]  butalbital-acetaminophen-caffeine (FIORICET) 50-325-40 MG tablet Take 1 tablet by mouth every 4 (four) hours as needed for pain.   Yes [provider]  butalbital-aspirin-caffeine Benny Lennert) 50-325-40 MG capsule Take 1 capsule by mouth every 6 (six) hours as needed for headache.   Yes [provider]  Calcium Carb-Cholecalciferol (CALTRATE 600+D3) 600-800 MG-UNIT TABS Take 1 tablet by mouth 2 (two) times daily. 01/26/18  Yes [provider]  cetirizine (ZYRTEC) 10 MG tablet Take 10 mg by mouth daily. 04/29/18  Yes [provider]  cyanocobalamin (,VITAMIN B-12,) 1000 MCG/ML injection Inject 1,000 mcg into the muscle every 30 (thirty) days. 02/25/18  Yes [provider]  ergocalciferol (VITAMIN D2) 1.25 MG (50000 UT) capsule Take 1.25 mcg by mouth every 30 (thirty) days. 02/08/18  Yes [provider]  ferrous sulfate  325 (65 FE) MG tablet Take 325 mg by mouth 2 (two) times daily with a meal.   Yes [provider]  FYAVOLV 0.5-2.5 MG-MCG tablet Take 1 tablet by mouth daily. 02/19/18  Yes [provider]  gabapentin (NEURONTIN) 400 MG capsule Take 1 capsule (400 mg total) by mouth 3 (three) times daily for 30 days. 06/28/18 07/28/18 Yes King, Shana Chute, NP  Galcanezumab-gnlm 120 MG/ML SOAJ Inject 120 mg into the skin every 30 (thirty) days. 06/28/18  Yes [provider]  isosorbide dinitrate (ISORDIL) 30 MG tablet Take 30 mg by mouth daily.   Yes [provider]  lactulose (CHRONULAC) 10 GM/15ML solution Take 15 mLs by mouth as needed. 01/26/18  Yes [provider]  levothyroxine (LEVOXYL) 88 MCG tablet Take 88 mcg by mouth daily. 01/26/18  Yes [provider]  nitroGLYCERIN (NITROSTAT) 0.4 MG SL tablet Place 0.4 mg under the tongue as needed. 01/26/18  Yes [provider]  ondansetron (ZOFRAN) 8 MG tablet Take 8 mg by mouth as needed. 01/26/18  Yes [provider]  Oxcarbazepine (TRILEPTAL) 300 MG tablet Take 300 mg by mouth 2 (two) times daily. 01/26/18  Yes [provider]  QUEtiapine (SEROQUEL) 50 MG tablet Take 50 mg by mouth at bedtime. 03/15/18  Yes [provider]  ranolazine (RANEXA) 500 MG 12 hr tablet Take 500 mg by mouth 2 (two) times daily.   Yes [provider]  rosuvastatin (CRESTOR) 10 MG tablet TK 1 T PO QD 06/09/18  Yes [provider]  rosuvastatin (CRESTOR) 5 MG tablet Take 5 mg by mouth daily. 02/08/18  Yes [provider]  SUMAtriptan (IMITREX) 100 MG tablet Take 100 mg by mouth as needed. 01/26/18  Yes [provider]  tiZANidine (ZANAFLEX) 4 MG tablet Take 1 tablet (4 mg total) by mouth 3 (three) times daily. 05/31/18 08/29/18 Yes Barbette Merino, NP  traMADol (ULTRAM) 50 MG tablet Take 1 tablet (50 mg total) by mouth every 12 (twelve) hours as needed for up to 30 days for severe pain. Month last 30 days. 07/14/18 08/13/18 Yes King, Shana Chute, NP  zolpidem (AMBIEN) 5 MG tablet Take 5 mg by mouth at bedtime. 07/09/18 08/08/18 Yes [provider]     ALLERGIES:  Allergies  Allergen Reactions  . Depakote [Divalproex Sodium] Shortness Of Breath  . Pregabalin Rash and Other (See Comments)    Other Reaction: falls; decreased coordination  . Valproic Acid Other (See Comments)    Other Reaction: Other reaction  . Levetiracetam Rash  and Itching     SOCIAL HISTORY:  Social History   Socioeconomic History  . Marital status: Single    Spouse name: Not on file  . Number of children: Not on file  . Years of education: Not on file  . Highest education level: Not on file  Occupational History  . Not on file  Social Needs  . Financial resource strain: Not on file  . Food insecurity:    Worry: Not on file    Inability: Not on file  . Transportation needs:    Medical: Not on file    Non-medical: Not on file  Tobacco Use  . Smoking status: Never Smoker  . Smokeless tobacco: Never Used  Substance and Sexual Activity  . Alcohol use: Not Currently  . Drug use: Never  . Sexual activity: Not on file  Lifestyle  . Physical activity:    Days per week: Not on file  Minutes per session: Not on file  . Stress: Not on file  Relationships  . Social connections:    Talks on phone: Not on file    Gets together: Not on file    Attends religious service: Not on file    Active member of club or organization: Not on file    Attends meetings of clubs or organizations: Not on file    Relationship status: Not on file  . Intimate partner violence:    Fear of current or ex partner: Not on file    Emotionally abused: Not on file    Physically abused: Not on file    Forced sexual activity: Not on file  Other Topics Concern  . Not on file  Social History Narrative  . Not on file     FAMILY HISTORY:  Family History  Problem Relation Age of Onset  . Cancer Mother   . Hypertension Mother   . COPD Mother   . Asthma Sister   . Psoriasis Sister   . Epilepsy Brother       REVIEW OF SYSTEMS:  Review of Systems  Constitutional: Negative for chills and fever.  HENT: Negative for congestion and sore throat.   Respiratory: Negative for cough and shortness of breath.   Cardiovascular: Negative for chest pain.  Gastrointestinal: Positive for abdominal pain, constipation and nausea. Negative for vomiting.  Genitourinary:  Negative for dysuria and frequency.  Musculoskeletal: Negative for myalgias and neck pain.  Neurological: Negative for dizziness and headaches.  All other systems reviewed and are negative.   VITAL SIGNS:  Temp:  [97.7 F (36.5 C)-98 F (36.7 C)] 98 F (36.7 C) (05/05 0445) Pulse Rate:  [66-77] 73 (05/05 0445) Resp:  [16-19] 19 (05/05 0445) BP: (103-110)/(62-72) 108/68 (05/05 0445) SpO2:  [97 %-99 %] 98 % (05/05 0445) Weight:  [86.2 kg] 86.2 kg (05/05 0536)     Height: 5\' 2"  (157.5 cm) Weight: 86.2 kg BMI (Calculated): 34.75   INTAKE/OUTPUT:  This shift: Total I/O In: 276.3 [I.V.:276.3] Out: -   BM x1  PHYSICAL EXAM:  Physical Exam Constitutional:      General: She is not in acute distress.    Appearance: She is well-developed. She is not ill-appearing.  HENT:     Head: Normocephalic and atraumatic.  Eyes:     General: No scleral icterus.    Extraocular Movements: Extraocular movements intact.  Cardiovascular:     Rate and Rhythm: Normal rate and regular rhythm.     Heart sounds: Normal heart sounds. No murmur. No friction rub. No gallop.   Pulmonary:     Effort: Pulmonary effort is normal. No respiratory distress.     Breath sounds: Normal breath sounds. No wheezing or rhonchi.  Abdominal:     General: Abdomen is flat. A surgical scar is present. There is no distension.     Palpations: Abdomen is soft.     Tenderness: There is no abdominal tenderness. There is no guarding or rebound. Negative signs include Murphy's sign, Rovsing's sign, McBurney's sign and psoas sign.     Comments: No peritoneal signs.  States abdominal discomfort is improved from yesterday.  Genitourinary:    Comments: Deferred Skin:    General: Skin is warm and dry.     Coloration: Skin is not jaundiced or pale.  Neurological:     General: No focal deficit present.     Mental Status: She is alert and oriented to person, place, and time.  Psychiatric:  Mood and Affect: Mood normal.         Behavior: Behavior normal.       Labs:  CBC Latest Ref Rng & Units 07/13/2018 07/12/2018 07/11/2018  WBC 4.0 - 10.5 K/uL 5.8 8.8 9.9  Hemoglobin 12.0 - 15.0 g/dL 8.6(V) 7.8(I) 10.3(L)  Hematocrit 36.0 - 46.0 % 30.2(L) 29.3(L) 32.7(L)  Platelets 150 - 400 K/uL 539(H) 551(H) 585(H)   CMP Latest Ref Rng & Units 07/13/2018 07/11/2018 04/25/2018  Glucose 70 - 99 mg/dL 98 696(E) 952(W)  BUN 6 - 20 mg/dL <4(X) 9 6  Creatinine 3.24 - 1.00 mg/dL 4.01 0.27 2.53  Sodium 135 - 145 mmol/L 140 130(L) 136  Potassium 3.5 - 5.1 mmol/L 3.8 3.9 4.0  Chloride 98 - 111 mmol/L 110 98 107  CO2 22 - 32 mmol/L 25 21(L) 26  Calcium 8.9 - 10.3 mg/dL 8.1(L) 8.2(L) 7.9(L)  Total Protein 6.5 - 8.1 g/dL - 7.0 -  Total Bilirubin 0.3 - 1.2 mg/dL - 0.6 -  Alkaline Phos 38 - 126 U/L - 153(H) -  AST 15 - 41 U/L - 27 -  ALT 0 - 44 U/L - 22 -    Imaging studies:   CT Abdomen/Pelvis (07/11/2018) personally reviewed and radiologist report reviewed below. I agree with significant stool burden, appendix is dilated however no stranding or surrounding fluid. IMPRESSION: 1. The appendix is prominent in caliber along most of its length measuring up to 11 mm. However, there is no adjacent stranding. Also, the patient's white blood cell count is normal. Despite the prominence in caliber, suspicion for appendicitis is low based on today's imaging due to the lack of adjacent stranding and the lack of an elevated white blood cell count. Recommend clinical correlation and follow-up if clinically warranted. 2. Status post gastric bypass.  No small bowel obstruction. 3. Severe fecal loading throughout the length of the colon. 4. Intra and extrahepatic biliary duct dilatation is likely from previous cholecystectomy. Of note, the patient's bilirubin is normal today. 5. 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    Assessment/Plan: (ICD-10's: R10.9) 53 y.o. female with epigastric > RLQ abdominal pain  most likely attributable to severe constipation rather than leukocytosis given her lack of WBC without ABx intervention, complicated by pertinent comorbidities including a history of chronic constipation management with lactulose only.   - Advance diet as tolerated  - No indication for IV Abx or surgery; surgery will sign off  - pain control prn  - Needs an aggressive bowel regimen both while in the hospital and upon discharge  - Discussed the importance of fiber and hydration in managing chronic constipation   All of the above findings and recommendations were discussed with the patient, and all of patient's questions were answered to her expressed satisfaction.  Thank you for the opportunity to participate in this patient's care.

## 2018-07-13 NOTE — Plan of Care (Signed)
  Problem: Elimination: Goal: Will not experience complications related to bowel motility Outcome: Progressing Patient received enema and mag citrate  Goal: Will not experience complications related to urinary retention Outcome: Progressing

## 2018-07-14 LAB — GLUCOSE, CAPILLARY
Glucose-Capillary: 98 mg/dL (ref 70–99)
Glucose-Capillary: 99 mg/dL (ref 70–99)
Glucose-Capillary: 166 mg/dL — ABNORMAL HIGH (ref 70–99)
Glucose-Capillary: 142 mg/dL — ABNORMAL HIGH (ref 70–99)

## 2018-07-14 MED ORDER — POLYETHYLENE GLYCOL 3350 17 GM/SCOOP PO POWD
1.0000 | Freq: Once | ORAL | Status: AC
  Administered 2018-07-14: 255 g via ORAL
  Filled 2018-07-14: qty 255

## 2018-07-14 MED ORDER — ROSUVASTATIN CALCIUM 10 MG PO TABS
10.0000 mg | ORAL_TABLET | Freq: Every day | ORAL | Status: DC
  Administered 2018-07-15 – 2018-07-16 (×2): 10 mg via ORAL
  Filled 2018-07-14 (×2): qty 1

## 2018-07-14 MED ORDER — ROSUVASTATIN CALCIUM 10 MG PO TABS
5.0000 mg | ORAL_TABLET | Freq: Once | ORAL | Status: AC
  Administered 2018-07-14: 13:00:00 5 mg via ORAL
  Filled 2018-07-14: qty 1

## 2018-07-14 NOTE — Progress Notes (Signed)
[facility] - [place] at [facility]   PATIENT NAME: Diana Quinn    MR#:  [id number]  DATE OF BIRTH:  [birth year]  SUBJECTIVE:  CHIEF COMPLAINT:   Chief Complaint  Patient presents with  . Abdominal Pain    mid epigastric   Continues to have abdominal pain.  Had one small bowel movement yesterday with enema. Nausea present but no vomiting.  REVIEW OF SYSTEMS:    Review of Systems  Constitutional: Positive for malaise/fatigue. Negative for chills and fever.  HENT: Negative for sore throat.   Eyes: Negative for blurred vision, double vision and pain.  Respiratory: Negative for cough, hemoptysis, shortness of breath and wheezing.   Cardiovascular: Negative for chest pain, palpitations, orthopnea and leg swelling.  Gastrointestinal: Positive for abdominal pain, constipation and nausea. Negative for diarrhea, heartburn and vomiting.  Genitourinary: Negative for dysuria and hematuria.  Musculoskeletal: Negative for back pain and joint pain.  Skin: Negative for rash.  Neurological: Negative for sensory change, speech change, focal weakness and headaches.  Endo/Heme/Allergies: Does not bruise/bleed easily.  Psychiatric/Behavioral: Negative for depression. The patient is not nervous/anxious.     DRUG ALLERGIES:   Allergies  Allergen Reactions  . Depakote [Divalproex Sodium] Shortness Of Breath  . Pregabalin Rash and Other (See Comments)    Other Reaction: falls; decreased coordination  . Valproic Acid Other (See Comments)    Other Reaction: Other reaction  . Levetiracetam Rash and Itching    VITALS:  Blood pressure 106/73, pulse 62, temperature 98 F (36.7 C), temperature source Oral, resp. rate 20, height 5\' 2"  (1.575 m), weight 86.2 kg, SpO2 97 %.  PHYSICAL EXAMINATION:   Physical Exam  GENERAL:  53 y.o.-year-old patient lying in the bed.  Distress due to pain EYES: Pupils equal, round, reactive to light and accommodation. No scleral icterus.  Extraocular muscles intact.  HEENT: Head atraumatic, normocephalic. Oropharynx and nasopharynx clear.  NECK:  Supple, no jugular venous distention. No thyroid enlargement, no tenderness.  LUNGS: Normal breath sounds bilaterally, no wheezing, rales, rhonchi. No use of accessory muscles of respiration.  CARDIOVASCULAR: S1, S2 normal. No murmurs, rubs, or gallops.  ABDOMEN: Soft, diffuse tenderness, nondistended. Bowel sounds present. No organomegaly or mass.  EXTREMITIES: No cyanosis, clubbing or edema b/l.    NEUROLOGIC: Cranial nerves II through XII are intact. No focal Motor or sensory deficits b/l.   PSYCHIATRIC: The patient is alert and oriented x 3.  SKIN: No obvious rash, lesion, or ulcer.   LABORATORY PANEL:   CBC Recent Labs  Lab 07/13/18 0434  WBC 5.8  HGB 9.2*  HCT 30.2*  PLT 539*   ------------------------------------------------------------------------------------------------------------------ Chemistries  Recent Labs  Lab 07/11/18 2045 07/13/18 0434  NA 130* 140  K 3.9 3.8  CL 98 110  CO2 21* 25  GLUCOSE 109* 98  BUN 9 <5*  CREATININE 0.59 0.48  CALCIUM 8.2* 8.1*  AST 27  --   ALT 22  --   ALKPHOS 153*  --   BILITOT 0.6  --    ------------------------------------------------------------------------------------------------------------------  Cardiac Enzymes No results for input(s): TROPONINI in the last 168 hours. ------------------------------------------------------------------------------------------------------------------  RADIOLOGY:  No results found.   ASSESSMENT AND PLAN:   This is a 53 year old female admitted for abdominal pain.  1.    Diffuse abdominal pain secondary to severe constipation.  Continues to have pain today.  Had one bowel movement yesterday but severe stool burden on CT scan.  Continues to be symptomatic with pain and nausea.  Will give another enema.  Lactulose will be continued.  A  No bowel movements in 2 weeks prior to  admission. Had a small BM after enema- give miralax bowel prep.  There was some concern regarding mild appendicitis but patient is not typical presentation. Surgical team has weighed him and no antibiotics or surgery advised.  Monitor.  Tolerating diet.  Likely discharge tomorrow once patient has more bowel movements  2.  Hyponatremia, hypovolemic Resolved with IV fluids. Stop fluids.   3.  CAD: Stable; continue Ranexa.    4.  Hypothyroidism: TSH within normal limits.  Continue Synthroid.  5.  Diabetes type II: Sliding scale insulin while hospitalized.  Hold oral hypoglycemic agents  6.  Epilepsy: ; continue Trileptal  7.  Hyperlipidemia: Continue statin therapy  8.  Migraine headaches: Controlled; sumatriptan as needed  All the records are reviewed and case discussed with Care Management/Social Workerr. Management plans discussed with the patient, family and they are in agreement.  CODE STATUS: Full code  DVT Prophylaxis: SCDs  TOTAL TIME TAKING CARE OF THIS PATIENT: 35 minutes.   POSSIBLE D/C IN 1-2 DAYS, DEPENDING ON CLINICAL CONDITION.  Altamese Dilling M.D on 07/14/2018 at 1:59 PM  Between 7am to 6pm - Pager - [phone]  After 6pm go to [email or web address] - password [email or web address] [facility]  [facility] [place] [facility]  Office  [phone]  CC: Primary care physician; Fayrene Helper, NP  Note: This dictation was prepared with Dragon dictation along with smaller phrase technology. Any transcriptional errors that result from this process are unintentional.

## 2018-07-15 ENCOUNTER — Telehealth: Payer: Self-pay

## 2018-07-15 LAB — GLUCOSE, CAPILLARY
Glucose-Capillary: 96 mg/dL (ref 70–99)
Glucose-Capillary: 92 mg/dL (ref 70–99)
Glucose-Capillary: 126 mg/dL — ABNORMAL HIGH (ref 70–99)
Glucose-Capillary: 77 mg/dL (ref 70–99)

## 2018-07-15 NOTE — Progress Notes (Signed)
[facility] - [place] at [facility]   PATIENT NAME: Diana Quinn    MR#:  [id number]  DATE OF BIRTH:  1965/12/31  SUBJECTIVE:  CHIEF COMPLAINT:   Chief Complaint  Patient presents with  . Abdominal Pain    mid epigastric   Continues to have abdominal pain.  Had no bowel movement yesterday with bowel preparation. Nausea present but no vomiting.  REVIEW OF SYSTEMS:    Review of Systems  Constitutional: Positive for malaise/fatigue. Negative for chills and fever.  HENT: Negative for sore throat.   Eyes: Negative for blurred vision, double vision and pain.  Respiratory: Negative for cough, hemoptysis, shortness of breath and wheezing.   Cardiovascular: Negative for chest pain, palpitations, orthopnea and leg swelling.  Gastrointestinal: Positive for abdominal pain, constipation and nausea. Negative for diarrhea, heartburn and vomiting.  Genitourinary: Negative for dysuria and hematuria.  Musculoskeletal: Negative for back pain and joint pain.  Skin: Negative for rash.  Neurological: Negative for sensory change, speech change, focal weakness and headaches.  Endo/Heme/Allergies: Does not bruise/bleed easily.  Psychiatric/Behavioral: Negative for depression. The patient is not nervous/anxious.     DRUG ALLERGIES:   Allergies  Allergen Reactions  . Depakote [Divalproex Sodium] Shortness Of Breath  . Pregabalin Rash and Other (See Comments)    Other Reaction: falls; decreased coordination  . Valproic Acid Other (See Comments)    Other Reaction: Other reaction  . Levetiracetam Rash and Itching    VITALS:  Blood pressure 104/60, pulse 67, temperature (!) 97.4 F (36.3 C), temperature source Oral, resp. rate 13, height 5\' 2"  (1.575 m), weight 86.8 kg, SpO2 99 %.  PHYSICAL EXAMINATION:   Physical Exam  GENERAL:  53 y.o.-year-old patient lying in the bed.  Distress due to pain EYES: Pupils equal, round, reactive to light and accommodation. No scleral icterus.  Extraocular muscles intact.  HEENT: Head atraumatic, normocephalic. Oropharynx and nasopharynx clear.  NECK:  Supple, no jugular venous distention. No thyroid enlargement, no tenderness.  LUNGS: Normal breath sounds bilaterally, no wheezing, rales, rhonchi. No use of accessory muscles of respiration.  CARDIOVASCULAR: S1, S2 normal. No murmurs, rubs, or gallops.  ABDOMEN: Soft, diffuse tenderness, nondistended. Bowel sounds present. No organomegaly or mass.  EXTREMITIES: No cyanosis, clubbing or edema b/l.    NEUROLOGIC: Cranial nerves II through XII are intact. No focal Motor or sensory deficits b/l.   PSYCHIATRIC: The patient is alert and oriented x 3.  SKIN: No obvious rash, lesion, or ulcer.   LABORATORY PANEL:   CBC Recent Labs  Lab 07/13/18 0434  WBC 5.8  HGB 9.2*  HCT 30.2*  PLT 539*   ------------------------------------------------------------------------------------------------------------------ Chemistries  Recent Labs  Lab 07/11/18 2045 07/13/18 0434  NA 130* 140  K 3.9 3.8  CL 98 110  CO2 21* 25  GLUCOSE 109* 98  BUN 9 <5*  CREATININE 0.59 0.48  CALCIUM 8.2* 8.1*  AST 27  --   ALT 22  --   ALKPHOS 153*  --   BILITOT 0.6  --    ------------------------------------------------------------------------------------------------------------------  Cardiac Enzymes No results for input(s): TROPONINI in the last 168 hours. ------------------------------------------------------------------------------------------------------------------  RADIOLOGY:  No results found.   ASSESSMENT AND PLAN:   This is a 53 year old female admitted for abdominal pain.  1.    Diffuse abdominal pain secondary to severe constipation.  Continues to have pain today.  Had one bowel movement yesterday but severe stool burden on CT scan.  Continues to be symptomatic with pain and nausea.  Will give another enema.  Lactulose will be continued.  A  No bowel movements in 2 weeks prior to  admission. Had a small BM after enema- give miralax bowel prep. Still did not work much- give soap suds enema today.  There was some concern regarding mild appendicitis but patient is not typical presentation. Surgical team has weighed him and no antibiotics or surgery advised.  Monitor.  Tolerating diet.    2.  Hyponatremia, hypovolemic Resolved with IV fluids. Stop fluids.   3.  CAD: Stable; continue Ranexa.    4.  Hypothyroidism: TSH within normal limits.  Continue Synthroid.  5.  Diabetes type II: Sliding scale insulin while hospitalized.  Hold oral hypoglycemic agents  6.  Epilepsy: ; continue Trileptal  7.  Hyperlipidemia: Continue statin therapy  8.  Migraine headaches: Controlled; sumatriptan as needed  All the records are reviewed and case discussed with Care Management/Social Workerr. Management plans discussed with the patient, family and they are in agreement.  CODE STATUS: Full code  DVT Prophylaxis: SCDs  TOTAL TIME TAKING CARE OF THIS PATIENT: 35 minutes.   POSSIBLE D/C IN 1-2 DAYS, DEPENDING ON CLINICAL CONDITION.  Altamese DillingVaibhavkumar Michelle Wnek M.D on 07/15/2018 at 1:57 PM  Between 7am to 6pm - Pager - [phone]  After 6pm go to [email or web address] - password [email or web address] [facility]  [facility] [place] [facility]  Office  [phone]  CC: Primary care physician; Fayrene HelperBoswell, [name] H, NP  Note: This dictation was prepared with Dragon dictation along with smaller phrase technology. Any transcriptional errors that result from this process are unintentional.

## 2018-07-15 NOTE — Telephone Encounter (Signed)
FYI She is in the hospital and they are giving her percocet while she is here.Diana Quinn She will let us know if they script to take home.

## 2018-07-16 ENCOUNTER — Inpatient Hospital Stay: Payer: BLUE CROSS/BLUE SHIELD

## 2018-07-16 LAB — BASIC METABOLIC PANEL
Anion gap: 7 (ref 5–15)
BUN: 6 mg/dL (ref 6–20)
CO2: 27 mmol/L (ref 22–32)
Calcium: 8.4 mg/dL — ABNORMAL LOW (ref 8.9–10.3)
Chloride: 93 mmol/L — ABNORMAL LOW (ref 98–111)
Creatinine, Ser: 0.65 mg/dL (ref 0.44–1.00)
GFR calc Af Amer: >60 mL/min (ref >60)
GFR calc non Af Amer: >60 mL/min (ref >60)
Glucose, Bld: 94 mg/dL (ref 70–99)
Potassium: 3.8 mmol/L (ref 3.5–5.1)
Sodium: 127 mmol/L — ABNORMAL LOW (ref 135–145)

## 2018-07-16 LAB — GLUCOSE, CAPILLARY
Glucose-Capillary: 83 mg/dL (ref 70–99)
Glucose-Capillary: 98 mg/dL (ref 70–99)

## 2018-07-16 MED ORDER — SENNA 8.6 MG PO TABS: 1.0000 | ORAL_TABLET | Freq: Every day | ORAL | Status: DC

## 2018-07-16 MED ORDER — POLYETHYLENE GLYCOL 3350 17 G PO PACK: 17.0000 g | PACK | Freq: Two times a day (BID) | ORAL | 0 refills | Status: DC

## 2018-07-16 MED ORDER — SENNA 8.6 MG PO TABS: 1.0000 | ORAL_TABLET | Freq: Every day | ORAL | 0 refills | Status: DC

## 2018-07-16 MED ORDER — MORPHINE SULFATE (PF) 2 MG/ML IV SOLN
2.0000 mg | Freq: Once | INTRAVENOUS | Status: AC
  Administered 2018-07-16: 07:00:00 2 mg via INTRAVENOUS
  Filled 2018-07-16: qty 1

## 2018-07-16 MED ORDER — OXYCODONE-ACETAMINOPHEN 5-325 MG PO TABS: 1.0000 | ORAL_TABLET | Freq: Four times a day (QID) | ORAL | 0 refills | Status: DC | PRN

## 2018-07-16 MED ORDER — DOCUSATE SODIUM 100 MG PO CAPS: 100.0000 mg | ORAL_CAPSULE | Freq: Two times a day (BID) | ORAL | 0 refills | Status: DC

## 2018-07-16 MED ORDER — ALUM & MAG HYDROXIDE-SIMETH 200-200-20 MG/5ML PO SUSP
30.0000 mL | Freq: Once | ORAL | Status: AC
  Administered 2018-07-16: 30 mL via ORAL
  Filled 2018-07-16: qty 30

## 2018-07-16 MED ORDER — POLYETHYLENE GLYCOL 3350 17 G PO PACK: 17.0000 g | PACK | Freq: Two times a day (BID) | ORAL | Status: DC

## 2018-07-16 NOTE — Progress Notes (Signed)
Patient is being discharge to home this afternoon. DC & Rx instructions given and patient acknowledged understanding. IV removed. Patient will call family member to come and pick her up.

## 2018-07-16 NOTE — Discharge Summary (Signed)
[facility] [place] at [facility]   PATIENT NAME: Diana Quinn    MR#:  [id number]  DATE OF BIRTH:  December 31, 1965  DATE OF ADMISSION:  07/11/2018 ADMITTING PHYSICIAN: Arnaldo Natal, MD  DATE OF DISCHARGE: 07/16/2018  PRIMARY CARE PHYSICIAN: Fayrene Helper, NP    ADMISSION DIAGNOSIS:  Hx of gastric bypass [Z98.84] Intractable abdominal pain [R10.9]  DISCHARGE DIAGNOSIS:  Principal Problem:   Abdominal pain Active Problems:   Generalized anxiety disorder   Hypothyroidism   Schizophrenia (HCC)   Chronic pain syndrome   Intractable abdominal pain   SECONDARY DIAGNOSIS:   Past Medical History:  Diagnosis Date  . Gastric bypass status for obesity   . H/O unstable angina   . Hypoglycemia   . MI (myocardial infarction) (HCC)   . Orthostatic hypotension   . Osteoporosis   . Sciatic nerve disease   . Thyroid disease     HOSPITAL COURSE:   This is a 53 year old female admitted for abdominal pain.  1. Diffuse abdominal pain secondary to severe constipation.  Continues to have pain today. Had one bowel movement yesterday but severe stool burden on CT scan.  Continues to be symptomatic with pain and nausea.  Will give another enema.  Lactulose will be continued. A  No bowel movements in 2 weeks prior to admission. Had a small BM after enema - give miralax bowel prep. Given soap suds enema - had 2-3 BM yesterday- have some abd pain. There was some concern regarding mild appendicitis but patient is not typical presentation. Surgical team has weighed him and no antibiotics or surgery advised.  Monitor.  Tolerating diet. discharge home with oral laxatives.  2.Hyponatremia, hypovolemic Resolved with IV fluids. Stop fluids.   3. CAD: Stable; continue Ranexa.   4. Hypothyroidism: TSH within normal limits. Continue Synthroid.  5. Diabetes type II: Sliding scale insulin while hospitalized. Hold oral hypoglycemic agents  6. Epilepsy: ;  continue Trileptal  7. Hyperlipidemia: Continue statin therapy  8. Migraine headaches: Controlled; sumatriptan as needed  DISCHARGE CONDITIONS:   Stable.  CONSULTS OBTAINED:    DRUG ALLERGIES:   Allergies  Allergen Reactions  . Depakote [Divalproex Sodium] Shortness Of Breath  . Pregabalin Rash and Other (See Comments)    Other Reaction: falls; decreased coordination  . Valproic Acid Other (See Comments)    Other Reaction: Other reaction  . Levetiracetam Rash and Itching    DISCHARGE MEDICATIONS:   Allergies as of 07/16/2018      Reactions   Depakote [divalproex Sodium] Shortness Of Breath   Pregabalin Rash, Other (See Comments)   Other Reaction: falls; decreased coordination   Valproic Acid Other (See Comments)   Other Reaction: Other reaction   Levetiracetam Rash, Itching      Medication List    STOP taking these medications   butalbital-aspirin-caffeine 50-325-40 MG capsule Commonly known as:  FIORINAL     TAKE these medications   Abilify Maintena 400 MG Prsy prefilled syringe Generic drug:  ARIPiprazole ER Inject 400 mg into the muscle every 30 (thirty) days.   acarbose 100 MG tablet Commonly known as:  PRECOSE Take 100 mg by mouth 3 (three) times daily. With meals   alendronate 70 MG tablet Commonly known as:  FOSAMAX Take 70 mg by mouth once a week.   butalbital-acetaminophen-caffeine 50-325-40 MG tablet Commonly known as:  FIORICET Take 1 tablet by mouth every 4 (four) hours as needed for pain.   Caltrate 600+D3 600-800 MG-UNIT Tabs Generic  drug:  Calcium Carb-Cholecalciferol Take 1 tablet by mouth 2 (two) times daily.   cetirizine 10 MG tablet Commonly known as:  ZYRTEC Take 10 mg by mouth daily.   cyanocobalamin 1000 MCG/ML injection Commonly known as:  (VITAMIN B-12) Inject 1,000 mcg into the muscle every 30 (thirty) days.   docusate sodium 100 MG capsule Commonly known as:  COLACE Take 1 capsule (100 mg total) by mouth 2 (two)  times daily.   ergocalciferol 1.25 MG (50000 UT) capsule Commonly known as:  VITAMIN D2 Take 1.25 mcg by mouth every 30 (thirty) days.   ferrous sulfate 325 (65 FE) MG tablet Take 325 mg by mouth 2 (two) times daily with a meal.   Fyavolv 0.5-2.5 MG-MCG tablet Generic drug:  norethindrone-ethinyl estradiol Take 1 tablet by mouth daily.   gabapentin 400 MG capsule Commonly known as:  Neurontin Take 1 capsule (400 mg total) by mouth 3 (three) times daily for 30 days.   Galcanezumab-gnlm 120 MG/ML Soaj Inject 120 mg into the skin every 30 (thirty) days.   isosorbide dinitrate 30 MG tablet Commonly known as:  ISORDIL Take 30 mg by mouth daily.   lactulose 10 GM/15ML solution Commonly known as:  CHRONULAC Take 15 mLs by mouth as needed.   Levoxyl 88 MCG tablet Generic drug:  levothyroxine Take 88 mcg by mouth daily.   nitroGLYCERIN 0.4 MG SL tablet Commonly known as:  NITROSTAT Place 0.4 mg under the tongue as needed.   oxyCODONE-acetaminophen 5-325 MG tablet Commonly known as:  PERCOCET/ROXICET Take 1-2 tablets by mouth every 6 (six) hours as needed for moderate pain or severe pain.   polyethylene glycol 17 g packet Commonly known as:  MIRALAX / GLYCOLAX Take 17 g by mouth 2 (two) times daily.   QUEtiapine 50 MG tablet Commonly known as:  SEROQUEL Take 50 mg by mouth at bedtime.   ranolazine 500 MG 12 hr tablet Commonly known as:  RANEXA Take 500 mg by mouth 2 (two) times daily.   rosuvastatin 5 MG tablet Commonly known as:  CRESTOR Take 10 mg by mouth daily. What changed:  Another medication with the same name was removed. Continue taking this medication, and follow the directions you see here.   senna 8.6 MG Tabs tablet Commonly known as:  SENOKOT Take 1 tablet (8.6 mg total) by mouth daily.   SUMAtriptan 100 MG tablet Commonly known as:  IMITREX Take 100 mg by mouth as needed.   tiZANidine 4 MG tablet Commonly known as:  ZANAFLEX Take 1 tablet (4 mg  total) by mouth 3 (three) times daily.   traMADol 50 MG tablet Commonly known as:  ULTRAM Take 1 tablet (50 mg total) by mouth every 12 (twelve) hours as needed for up to 30 days for severe pain. Month last 30 days.   Trileptal 300 MG tablet Generic drug:  Oxcarbazepine Take 300 mg by mouth 2 (two) times daily.   Zofran 8 MG tablet Generic drug:  ondansetron Take 8 mg by mouth as needed.   zolpidem 5 MG tablet Commonly known as:  AMBIEN Take 5 mg by mouth at bedtime.        DISCHARGE INSTRUCTIONS:   Follow with PMD in 1-2 weeks.  If you experience worsening of your admission symptoms, develop shortness of breath, life threatening emergency, suicidal or homicidal thoughts you must seek medical attention immediately by calling 911 or calling your MD immediately  if symptoms less severe.  You Must read complete instructions/literature along with  all the possible adverse reactions/side effects for all the Medicines you take and that have been prescribed to you. Take any new Medicines after you have completely understood and accept all the possible adverse reactions/side effects.   Please note  You were cared for by a hospitalist during your hospital stay. If you have any questions about your discharge medications or the care you received while you were in the hospital after you are discharged, you can call the unit and asked to speak with the hospitalist on call if the hospitalist that took care of you is not available. Once you are discharged, your primary care physician will handle any further medical issues. Please note that NO REFILLS for any discharge medications will be authorized once you are discharged, as it is imperative that you return to your primary care physician (or establish a relationship with a primary care physician if you do not have one) for your aftercare needs so that they can reassess your need for medications and monitor your lab values.    Today   CHIEF  COMPLAINT:   Chief Complaint  Patient presents with  . Abdominal Pain    mid epigastric    HISTORY OF PRESENT ILLNESS:  Diana Quinn  is a 53 y.o. female with a known history of heart disease status post MI, hypothyroidism and multiple abdominal surgeries presents to the emergency department complaining of abdominal pain.  The patient reports that her pain began approximately 36 hours ago.  She admits to associated nausea but denies vomiting.  The patient also admits that it has been 2 weeks since her last bowel movement.  She has had pancreatitis in the past but reports that her pain is different this time although she points to her epigastric area as the primary focus of pain.  Her left upper quadrant does not hurt but the rest of her abdomen is uncomfortable.  CT of her abdomen did not demonstrate pancreatitis however there is a concern that she may have subacute appendicitis.  After multiple doses of analgesia and antiemetic medication the patient continued to have abdominal pain which prompted the emergency department staff to call the hospitalist service for admission.  VITAL SIGNS:  Blood pressure 112/60, pulse 71, temperature 97.8 F (36.6 C), temperature source Oral, resp. rate 16, height 5\' 2"  (1.575 m), weight 87.6 kg, SpO2 100 %.  I/O:    Intake/Output Summary (Last 24 hours) at 07/16/2018 1314 Last data filed at 07/16/2018 0900 Gross per 24 hour  Intake 240 ml  Output -  Net 240 ml    PHYSICAL EXAMINATION:   GENERAL:  53 y.o.-year-old patient lying in the bed.  Distress due to pain EYES: Pupils equal, round, reactive to light and accommodation. No scleral icterus. Extraocular muscles intact.  HEENT: Head atraumatic, normocephalic. Oropharynx and nasopharynx clear.  NECK:  Supple, no jugular venous distention. No thyroid enlargement, no tenderness.  LUNGS: Normal breath sounds bilaterally, no wheezing, rales, rhonchi. No use of accessory muscles of respiration.   CARDIOVASCULAR: S1, S2 normal. No murmurs, rubs, or gallops.  ABDOMEN: Soft, diffuse tenderness, nondistended. Bowel sounds present. No organomegaly or mass.  EXTREMITIES: No cyanosis, clubbing or edema b/l.    NEUROLOGIC: Cranial nerves II through XII are intact. No focal Motor or sensory deficits b/l.   PSYCHIATRIC: The patient is alert and oriented x 3.  SKIN: No obvious rash, lesion, or ulcer.   DATA REVIEW:   CBC Recent Labs  Lab 07/13/18 0434  WBC 5.8  HGB 9.2*  HCT 30.2*  PLT 539*    Chemistries  Recent Labs  Lab 07/11/18 2045  07/16/18 0657  NA 130*   < > 127*  K 3.9   < > 3.8  CL 98   < > 93*  CO2 21*   < > 27  GLUCOSE 109*   < > 94  BUN 9   < > 6  CREATININE 0.59   < > 0.65  CALCIUM 8.2*   < > 8.4*  AST 27  --   --   ALT 22  --   --   ALKPHOS 153*  --   --   BILITOT 0.6  --   --    < > = values in this interval not displayed.    Cardiac Enzymes No results for input(s): TROPONINI in the last 168 hours.  Microbiology Results  Results for orders placed or performed during the hospital encounter of 07/11/18  SARS Coronavirus 2 (CEPHEID - Performed in [facility] hospital lab), Hosp Order     Status: None   Collection Time: 07/12/18 12:29 AM  Result Value Ref Range Status   SARS Coronavirus 2 NEGATIVE NEGATIVE Final    Comment: (NOTE) If result is NEGATIVE SARS-CoV-2 target nucleic acids are NOT DETECTED. The SARS-CoV-2 RNA is generally detectable in upper and lower  respiratory specimens during the acute phase of infection. The lowest  concentration of SARS-CoV-2 viral copies this assay can detect is 250  copies / mL. A negative result does not preclude SARS-CoV-2 infection  and should not be used as the sole basis for treatment or other  patient management decisions.  A negative result may occur with  improper specimen collection / handling, submission of specimen other  than nasopharyngeal swab, presence of viral mutation(s) within the  areas  targeted by this assay, and inadequate number of viral copies  (<250 copies / mL). A negative result must be combined with clinical  observations, patient history, and epidemiological information. If result is POSITIVE SARS-CoV-2 target nucleic acids are DETECTED. The SARS-CoV-2 RNA is generally detectable in upper and lower  respiratory specimens dur ing the acute phase of infection.  Positive  results are indicative of active infection with SARS-CoV-2.  Clinical  correlation with patient history and other diagnostic information is  necessary to determine patient infection status.  Positive results do  not rule out bacterial infection or co-infection with other viruses. If result is PRESUMPTIVE POSTIVE SARS-CoV-2 nucleic acids MAY BE PRESENT.   A presumptive positive result was obtained on the submitted specimen  and confirmed on repeat testing.  While 2019 novel coronavirus  (SARS-CoV-2) nucleic acids may be present in the submitted sample  additional confirmatory testing may be necessary for epidemiological  and / or clinical management purposes  to differentiate between  SARS-CoV-2 and other Sarbecovirus currently known to infect humans.  If clinically indicated additional testing with an alternate test  methodology [id number]) is advised. The SARS-CoV-2 RNA is generally  detectable in upper and lower respiratory sp ecimens during the acute  phase of infection. The expected result is Negative. Fact Sheet for Patients:  BoilerBrush.com.cyhttps://www.fda.gov/media/136312/download Fact Sheet for Healthcare Providers: https://pope.com/https://www.fda.gov/media/136313/download This test is not yet approved or cleared by the [place] FDA and has been authorized for detection and/or diagnosis of SARS-CoV-2 by FDA under an Emergency Use Authorization (EUA).  This EUA will remain in effect (meaning this test can be used) for the duration of the COVID-19 declaration under Section  564(b)(1) of the Act, 21 U.S.C. section  360bbb-3(b)(1), unless the authorization is terminated or revoked sooner. Performed at [facility], [address] [place], [place] [address]     RADIOLOGY:  Cathleen Corti 1 View  Result Date: 07/16/2018 CLINICAL DATA:  Abdominal pain constipation. EXAM: ABDOMEN - 1 VIEW COMPARISON:  None. FINDINGS: Surgical clips are noted throughout the abdomen. Phleboliths are noted in the pelvis. No abnormal bowel dilatation is noted. Stool is noted in the right colon. IMPRESSION: No definite evidence of bowel obstruction or ileus. Electronically Signed   By: Lupita Raider M.D.   On: 07/16/2018 12:40    EKG:   Orders placed or performed during the hospital encounter of 07/11/18  . EKG 12-Lead  . EKG 12-Lead  . ED EKG  . ED EKG  . EKG    Management plans discussed with the patient, family and they are in agreement.  CODE STATUS:     Code Status Orders  (From admission, onward)         Start     Ordered   07/12/18 0308  Full code  Continuous     07/12/18 0307        Code Status History    This patient has a current code status but no historical code status.      TOTAL TIME TAKING CARE OF THIS PATIENT: 35 minutes.    Altamese Dilling M.D on 07/16/2018 at 1:14 PM  Between 7am to 6pm - Pager - [phone]  After 6pm go to [email or web address] - Social research officer, government  [facility]  Office  [phone]  CC: Primary care physician; Fayrene Helper, NP   Note: This dictation was prepared with Dragon dictation along with smaller phrase technology. Any transcriptional errors that result from this process are unintentional.

## 2018-07-26 ENCOUNTER — Other Ambulatory Visit: Payer: Self-pay | Admitting: Gastroenterology

## 2018-07-26 ENCOUNTER — Other Ambulatory Visit: Payer: Self-pay

## 2018-07-26 ENCOUNTER — Other Ambulatory Visit (HOSPITAL_COMMUNITY): Payer: Self-pay | Admitting: Gastroenterology

## 2018-07-26 ENCOUNTER — Ambulatory Visit: Payer: BLUE CROSS/BLUE SHIELD | Attending: Nurse Practitioner | Admitting: Nurse Practitioner

## 2018-07-26 DIAGNOSIS — G894 Chronic pain syndrome: Secondary | ICD-10-CM

## 2018-07-26 DIAGNOSIS — R1013 Epigastric pain: Secondary | ICD-10-CM

## 2018-07-26 DIAGNOSIS — M47816 Spondylosis without myelopathy or radiculopathy, lumbar region: Secondary | ICD-10-CM | POA: Diagnosis not present

## 2018-07-26 DIAGNOSIS — M792 Neuralgia and neuritis, unspecified: Secondary | ICD-10-CM | POA: Diagnosis not present

## 2018-07-26 DIAGNOSIS — M7918 Myalgia, other site: Secondary | ICD-10-CM | POA: Diagnosis not present

## 2018-07-26 DIAGNOSIS — M533 Sacrococcygeal disorders, not elsewhere classified: Secondary | ICD-10-CM

## 2018-07-26 DIAGNOSIS — K838 Other specified diseases of biliary tract: Secondary | ICD-10-CM

## 2018-07-26 DIAGNOSIS — G8929 Other chronic pain: Secondary | ICD-10-CM

## 2018-07-26 MED ORDER — TRAMADOL HCL 50 MG PO TABS: 50.0000 mg | ORAL_TABLET | Freq: Two times a day (BID) | ORAL | 0 refills | Status: DC | PRN

## 2018-07-26 MED ORDER — GABAPENTIN 400 MG PO CAPS: 400.0000 mg | ORAL_CAPSULE | Freq: Three times a day (TID) | ORAL | 0 refills | Status: DC

## 2018-07-26 NOTE — Progress Notes (Signed)
Pain Management Encounter Note - Virtual Visit via Telephone Telehealth (real-time audio visits between healthcare provider and patient).  Patient's Phone No. & Preferred Pharmacy:  [phone] (home); [phone] (mobile); (Preferred) [phone]  [id number] [place]- [place], [place] - [address] [facility] [address] [place] [id number] Phone: [phone] Fax: [phone]   Pre-screening note:  Our staff contacted Diana Quinn and offered her an "in person", "face-to-face" appointment versus a telephone encounter. She indicated preferring the telephone encounter, at this time.  Reason for Virtual Visit: COVID-19*  Social distancing based on CDC and AMA recommendations.   I contacted Diana Quinn on 07/26/2018 at 11:33 AM by telephone and clearly identified myself as Thad Rangerrystal Rukiya Hodgkins, NP. I verified that I was speaking with the correct person using two identifiers (Name and date of birth: [birth year]).  Advanced Informed Consent I sought verbal advanced consent from Diana Quinn for telemedicine interactions and virtual visit. I informed Diana Quinn of the security and privacy concerns, risks, and limitations associated with performing an evaluation and management service by telephone. I also informed Diana Quinn of the availability of "in person" appointments and I informed her of the possibility of a patient responsible charge related to this service. Diana Quinn expressed understanding and agreed to proceed.   Historic Elements   Diana Quinn is a 53 y.o. year old, female patient evaluated today after her last encounter by our practice on 07/15/2018. Diana Quinn  has a past medical history of Gastric bypass status for obesity, H/O unstable angina, Hypoglycemia, MI (myocardial infarction) (HCC), Orthostatic hypotension, Osteoporosis, Sciatic nerve disease, and Thyroid disease. She also  has a past surgical history that includes Gastric bypass;  Abdominal hysterectomy; Cholecystectomy; Tonsillectomy; Gastric bypass open; Oophorectomy; Hernia repair; Abdominal adhesion surgery; Gastrostomy w/ feeding tube; Dilation and curettage of uterus; and Splenectomy. Diana Quinn has a current medication list which includes the following prescription(s): acarbose, alendronate, aripiprazole er, butalbital-acetaminophen-caffeine, calcium carb-cholecalciferol, cetirizine, cyanocobalamin, docusate sodium, ergocalciferol, ferrous sulfate, fyavolv, gabapentin, galcanezumab-gnlm, isosorbide dinitrate, lactulose, levothyroxine, nitroglycerin, ondansetron, oxcarbazepine, oxycodone-acetaminophen, polyethylene glycol, quetiapine, ranolazine, rosuvastatin, senna, sumatriptan, tizanidine, tramadol, and zolpidem. She  reports that she has never smoked. She has never used smokeless tobacco. She reports previous alcohol use. She reports that she does not use drugs. Diana Quinn is allergic to depakote [divalproex sodium]; pregabalin; valproic acid; and levetiracetam.   HPI  I last saw her on 06/28/2018. She is being evaluated for medication management. She is currently having 7/10 low back pain. She is having 10/10 abdominal pain. She admits that she had surgery in the past for adhesions to fix the abdominal pain however it has returned. She admits that she was in the hospital for 5 days related to this pain. She is awaiting a EGD for further evaluation. She was given Percocet for the pain. She admits that the she is going to run out of theses. The percocet has helped her back pain as well. She is concern what will she do for her pain in the future.  She admits that the lower back pain does cause leg pain. She has some numbness and tingling.   Pharmacotherapy Assessment  Analgesic: Tramadol 50 mg every twelve hours prn (Tramadol 100 mg/day) MME/day: 10 mg/day.    Monitoring: Pharmacotherapy: No side-effects or adverse reactions reported. [place] PMP: PDMP reviewed during this  encounter.       Percocet verified. Questioned patient her use of Phentermine along with 2 different  sleep aides Zoplidem and Eszopiclone. States no longer taking Phentermine or Eszopiclone.   Despite recent filled dates. Compliance: No problems identified. Plan: Refer to "POC".  Review of recent tests  DG Abd 1 View CLINICAL DATA:  Abdominal pain constipation.  EXAM: ABDOMEN - 1 VIEW  COMPARISON:  None.  FINDINGS: Surgical clips are noted throughout the abdomen. Phleboliths are noted in the pelvis. No abnormal bowel dilatation is noted. Stool is noted in the right colon.  IMPRESSION: No definite evidence of bowel obstruction or ileus.  Electronically Signed   By: Lupita Raider M.D.   On: 07/16/2018 12:40   Admission on 07/11/2018, Discharged on 07/16/2018  Component Date Value Ref Range Status  . Lipase 07/11/2018 22  11 - 51 U/L Final   Performed at [facility], [address] [place] [place], [place] [address]  . Sodium 07/11/2018 130* 135 - 145 mmol/L Final  . Potassium 07/11/2018 3.9  3.5 - 5.1 mmol/L Final  . Chloride 07/11/2018 98  98 - 111 mmol/L Final  . CO2 07/11/2018 21* 22 - 32 mmol/L Final  . Glucose, Bld 07/11/2018 109* 70 - 99 mg/dL Final  . BUN 60/63/0160 9  6 - 20 mg/dL Final  . Creatinine, Ser 07/11/2018 0.59  0.44 - 1.00 mg/dL Final  . Calcium 10/93/2355 8.2* 8.9 - 10.3 mg/dL Final  . Total Protein 07/11/2018 7.0  6.5 - 8.1 g/dL Final  . Albumin 73/22/0254 4.1  3.5 - 5.0 g/dL Final  . AST 27/08/2374 27  15 - 41 U/L Final  . ALT 07/11/2018 22  0 - 44 U/L Final  . Alkaline Phosphatase 07/11/2018 153* 38 - 126 U/L Final  . Total Bilirubin 07/11/2018 0.6  0.3 - 1.2 mg/dL Final  . GFR calc non Af Amer 07/11/2018 >60  >60 mL/min Final  . GFR calc Af Amer 07/11/2018 >60  >60 mL/min Final  . Anion gap 07/11/2018 11  5 - 15 Final   Performed at [facility], [address], [place], [place] [address]  . WBC 07/11/2018 9.9  4.0 - 10.5 K/uL Final   . RBC 07/11/2018 4.13  3.87 - 5.11 MIL/uL Final  . Hemoglobin 07/11/2018 10.3* 12.0 - 15.0 g/dL Final  . HCT 17/61/6073 32.7* 36.0 - 46.0 % Final  . MCV 07/11/2018 79.2* 80.0 - 100.0 fL Final  . MCH 07/11/2018 24.9* 26.0 - 34.0 pg Final  . MCHC 07/11/2018 31.5  30.0 - 36.0 g/dL Final  . RDW 71/08/2692 17.6* 11.5 - 15.5 % Final  . Platelets 07/11/2018 585* 150 - 400 K/uL Final  . nRBC 07/11/2018 0.0  0.0 - 0.2 % Final   Performed at [facility], [address], [place], [place] [address]  . Color, Urine 07/11/2018 YELLOW* YELLOW Final  . APPearance 07/11/2018 HAZY* CLEAR Final  . Specific Gravity, Urine 07/11/2018 1.021  1.005 - 1.030 Final  . pH 07/11/2018 5.0  5.0 - 8.0 Final  . Glucose, UA 07/11/2018 NEGATIVE  NEGATIVE mg/dL Final  . Hgb urine dipstick 07/11/2018 NEGATIVE  NEGATIVE Final  . Bilirubin Urine 07/11/2018 NEGATIVE  NEGATIVE Final  . Ketones, ur 07/11/2018 NEGATIVE  NEGATIVE mg/dL Final  . Protein, ur 70/35/0093 NEGATIVE  NEGATIVE mg/dL Final  . Nitrite 81/82/9937 NEGATIVE  NEGATIVE Final  . Leukocytes,Ua 07/11/2018 TRACE* NEGATIVE Final  . RBC / HPF 07/11/2018 0-5  0 - 5 RBC/hpf Final  . WBC, UA 07/11/2018 0-5  0 - 5 WBC/hpf Final  . Bacteria, UA 07/11/2018  NONE SEEN  NONE SEEN Final  . Squamous Epithelial / LPF 07/11/2018 6-10  0 - 5 Final  . Mucus 07/11/2018 PRESENT   Final   Performed at [facility], [address] [place] [place], [place] [address]  . SARS Coronavirus 2 07/12/2018 NEGATIVE  NEGATIVE Final   Comment: (NOTE) If result is NEGATIVE SARS-CoV-2 target nucleic acids are NOT DETECTED. The SARS-CoV-2 RNA is generally detectable in upper and lower  respiratory specimens during the acute phase of infection. The lowest  concentration of SARS-CoV-2 viral copies this assay can detect is 250  copies / mL. A negative result does not preclude SARS-CoV-2 infection  and should not be used as the sole basis for treatment or other  patient management  decisions.  A negative result may occur with  improper specimen collection / handling, submission of specimen other  than nasopharyngeal swab, presence of viral mutation(s) within the  areas targeted by this assay, and inadequate number of viral copies  (<250 copies / mL). A negative result must be combined with clinical  observations, patient history, and epidemiological information. If result is POSITIVE SARS-CoV-2 target nucleic acids are DETECTED. The SARS-CoV-2 RNA is generally detectable in upper and lower  respiratory specimens dur                          ing the acute phase of infection.  Positive  results are indicative of active infection with SARS-CoV-2.  Clinical  correlation with patient history and other diagnostic information is  necessary to determine patient infection status.  Positive results do  not rule out bacterial infection or co-infection with other viruses. If result is PRESUMPTIVE POSTIVE SARS-CoV-2 nucleic acids MAY BE PRESENT.   A presumptive positive result was obtained on the submitted specimen  and confirmed on repeat testing.  While 2019 novel coronavirus  (SARS-CoV-2) nucleic acids may be present in the submitted sample  additional confirmatory testing may be necessary for epidemiological  and / or clinical management purposes  to differentiate between  SARS-CoV-2 and other Sarbecovirus currently known to infect humans.  If clinically indicated additional testing with an alternate test  methodology [phone]) is advised. The SARS-CoV-2 RNA is generally  detectable in upper and lower respiratory sp                          ecimens during the acute  phase of infection. The expected result is Negative. Fact Sheet for Patients:  BoilerBrush.com.cy Fact Sheet for Healthcare Providers: https://pope.com/ This test is not yet approved or cleared by the [place] FDA and has been authorized for detection  and/or diagnosis of SARS-CoV-2 by FDA under an Emergency Use Authorization (EUA).  This EUA will remain in effect (meaning this test can be used) for the duration of the COVID-19 declaration under Section 564(b)(1) of the Act, 21 U.S.C. section 360bbb-3(b)(1), unless the authorization is terminated or revoked sooner. Performed at [facility], [address], [place], [place] [address]   . TSH 07/11/2018 1.191  0.350 - 4.500 uIU/mL Final   Comment: Performed by a 3rd Generation assay with a functional sensitivity of <=0.01 uIU/mL. Performed at [facility], [address], [place], [place] [address]   . Hgb A1c MFr Bld 07/11/2018 6.2* 4.8 - 5.6 % Final   Comment: (NOTE) Pre diabetes:          5.7%-6.4% Diabetes:              >  6.4% Glycemic control for   <7.0% adults with diabetes   . Mean Plasma Glucose 07/11/2018 131.24  mg/dL Final   Performed at [facility], [address], [place], [place] [address]  . Glucose-Capillary 07/12/2018 90  70 - 99 mg/dL Final  . Glucose-Capillary 07/12/2018 110* 70 - 99 mg/dL Final  . WBC 60/45/4098 8.8  4.0 - 10.5 K/uL Final  . RBC 07/12/2018 3.70* 3.87 - 5.11 MIL/uL Final  . Hemoglobin 07/12/2018 9.4* 12.0 - 15.0 g/dL Final  . HCT 11/91/4782 29.3* 36.0 - 46.0 % Final  . MCV 07/12/2018 79.2* 80.0 - 100.0 fL Final  . MCH 07/12/2018 25.4* 26.0 - 34.0 pg Final  . MCHC 07/12/2018 32.1  30.0 - 36.0 g/dL Final  . RDW 95/62/1308 17.8* 11.5 - 15.5 % Final  . Platelets 07/12/2018 551* 150 - 400 K/uL Final  . nRBC 07/12/2018 0.0  0.0 - 0.2 % Final   Performed at [facility], [address], [place], [place] [address]  . Glucose-Capillary 07/12/2018 89  70 - 99 mg/dL Final  . Comment 1 69/62/9528 Notify RN   Final  . Glucose-Capillary 07/12/2018 103* 70 - 99 mg/dL Final  . Comment 1 41/32/4401 Notify RN   Final  . Sodium 07/13/2018 140  135 - 145 mmol/L Final   RESULTS VERIFIED BY REPEAT TESTING JJB  . Potassium  07/13/2018 3.8  3.5 - 5.1 mmol/L Final  . Chloride 07/13/2018 110  98 - 111 mmol/L Final  . CO2 07/13/2018 25  22 - 32 mmol/L Final  . Glucose, Bld 07/13/2018 98  70 - 99 mg/dL Final  . BUN 02/72/5366 <5* 6 - 20 mg/dL Final  . Creatinine, Ser 07/13/2018 0.48  0.44 - 1.00 mg/dL Final  . Calcium 44/05/4740 8.1* 8.9 - 10.3 mg/dL Final  . GFR calc non Af Amer 07/13/2018 >60  >60 mL/min Final  . GFR calc Af Amer 07/13/2018 >60  >60 mL/min Final  . Anion gap 07/13/2018 5  5 - 15 Final   Performed at [facility], [address] [place], [place] [address]  . WBC 07/13/2018 5.8  4.0 - 10.5 K/uL Final  . RBC 07/13/2018 3.69* 3.87 - 5.11 MIL/uL Final  . Hemoglobin 07/13/2018 9.2* 12.0 - 15.0 g/dL Final  . HCT 87/56/4332 30.2* 36.0 - 46.0 % Final  . MCV 07/13/2018 81.8  80.0 - 100.0 fL Final  . MCH 07/13/2018 24.9* 26.0 - 34.0 pg Final  . MCHC 07/13/2018 30.5  30.0 - 36.0 g/dL Final  . RDW 95/18/8416 18.5* 11.5 - 15.5 % Final  . Platelets 07/13/2018 539* 150 - 400 K/uL Final  . nRBC 07/13/2018 0.0  0.0 - 0.2 % Final  . Neutrophils Relative % 07/13/2018 26  % Final  . Neutro Abs 07/13/2018 1.5* 1.7 - 7.7 K/uL Final  . Lymphocytes Relative 07/13/2018 51  % Final  . Lymphs Abs 07/13/2018 3.0  0.7 - 4.0 K/uL Final  . Monocytes Relative 07/13/2018 16  % Final  . Monocytes Absolute 07/13/2018 0.9  0.1 - 1.0 K/uL Final  . Eosinophils Relative 07/13/2018 6  % Final  . Eosinophils Absolute 07/13/2018 0.4  0.0 - 0.5 K/uL Final  . Basophils Relative 07/13/2018 1  % Final  . Basophils Absolute 07/13/2018 0.1  0.0 - 0.1 K/uL Final  . Immature Granulocytes 07/13/2018 0  % Final  . Abs Immature Granulocytes 07/13/2018 0.01  0.00 - 0.07 K/uL Final   Performed at [facility],  [address], [place], [place] [address]  . Glucose-Capillary 07/12/2018 121* 70 - 99 mg/dL Final  . Glucose-Capillary 07/12/2018 89  70 - 99 mg/dL Final  . Glucose-Capillary 07/13/2018 72  70 - 99 mg/dL Final   . Glucose-Capillary 07/13/2018 114* 70 - 99 mg/dL Final  . Glucose-Capillary 07/13/2018 113* 70 - 99 mg/dL Final  . Glucose-Capillary 07/13/2018 103* 70 - 99 mg/dL Final  . Glucose-Capillary 07/14/2018 98  70 - 99 mg/dL Final  . Comment 1 62/13/0865 Notify RN   Final  . Glucose-Capillary 07/14/2018 99  70 - 99 mg/dL Final  . Glucose-Capillary 07/14/2018 166* 70 - 99 mg/dL Final  . Glucose-Capillary 07/14/2018 142* 70 - 99 mg/dL Final  . Glucose-Capillary 07/15/2018 96  70 - 99 mg/dL Final  . Comment 1 78/46/9629 Notify RN   Final  . Glucose-Capillary 07/15/2018 92  70 - 99 mg/dL Final  . Comment 1 52/84/1324 Notify RN   Final  . Sodium 07/16/2018 127* 135 - 145 mmol/L Final  . Potassium 07/16/2018 3.8  3.5 - 5.1 mmol/L Final  . Chloride 07/16/2018 93* 98 - 111 mmol/L Final  . CO2 07/16/2018 27  22 - 32 mmol/L Final  . Glucose, Bld 07/16/2018 94  70 - 99 mg/dL Final  . BUN 40/12/2723 6  6 - 20 mg/dL Final  . Creatinine, Ser 07/16/2018 0.65  0.44 - 1.00 mg/dL Final  . Calcium 36/64/4034 8.4* 8.9 - 10.3 mg/dL Final  . GFR calc non Af Amer 07/16/2018 >60  >60 mL/min Final  . GFR calc Af Amer 07/16/2018 >60  >60 mL/min Final  . Anion gap 07/16/2018 7  5 - 15 Final   Performed at [facility], [address], [place], [place] [address]  . Glucose-Capillary 07/15/2018 126* 70 - 99 mg/dL Final  . Comment 1 56/38/7564 Notify RN   Final  . Glucose-Capillary 07/15/2018 77  70 - 99 mg/dL Final  . Glucose-Capillary 07/16/2018 83  70 - 99 mg/dL Final  . Comment 1 33/29/5188 Notify RN   Final  . Glucose-Capillary 07/16/2018 98  70 - 99 mg/dL Final  . Comment 1 41/66/0630 Notify RN   Final   Assessment  The primary encounter diagnosis was Lumbar spondylosis. Diagnoses of Chronic pain syndrome, Neurogenic pain, Chronic musculoskeletal pain, and Chronic sacroiliac joint pain (Bilateral) were also pertinent to this visit.  Plan of Care  I am having Diana Quinn maintain her alendronate,  ARIPiprazole ER, Calcium Carb-Cholecalciferol, cyanocobalamin, ergocalciferol, lactulose, levothyroxine, nitroGLYCERIN, Fyavolv, ondansetron, Oxcarbazepine, QUEtiapine, rosuvastatin, SUMAtriptan, ranolazine, isosorbide dinitrate, cetirizine, tiZANidine, acarbose, butalbital-acetaminophen-caffeine, Galcanezumab-gnlm, zolpidem, ferrous sulfate, oxyCODONE-acetaminophen, docusate sodium, polyethylene glycol, senna, traMADol, and gabapentin.  Pharmacotherapy (Medications Ordered): Meds ordered this encounter  Medications  . traMADol (ULTRAM) 50 MG tablet    Sig: Take 1 tablet (50 mg total) by mouth every 12 (twelve) hours as needed for up to 30 days for severe pain. Month last 30 days.    Dispense:  60 tablet    Refill:  0    [place] STOP ACT - Not applicable to Chronic Pain Syndrome (G89.4) diagnosis. Fill one day early if pharmacy is closed on scheduled refill date.    Order Specific Question:   Supervising Provider    Answer:   Delano Metz [phone]  . gabapentin (NEURONTIN) 400 MG capsule    Sig: Take 1 capsule (400 mg total) by mouth 3 (three) times daily for 30 days.    Dispense:  90 capsule    Refill:  0    Order Specific Question:   Supervising Provider    Answer:   Delano Metz [phone]   Orders:  No orders of the defined types were placed in this encounter.  Follow-up plan:   No follow-ups on file.   I discussed the assessment and treatment plan with the patient. The patient was provided an opportunity to ask questions and all were answered. The patient agreed with the plan and demonstrated an understanding of the instructions.  Patient advised to call back or seek an in-person evaluation if the symptoms or condition worsens.  Total duration of non-face-to-face encounter: 20 minutes.  Note by: Thad Ranger, NP Date: 07/26/2018; Time: 12:26 PM  Disclaimer:  * Given the special circumstances of the COVID-19 pandemic, the federal government has announced that the Office for  Civil Rights (OCR) will exercise its enforcement discretion and will not impose penalties on physicians using telehealth in the event of noncompliance with regulatory requirements under the [employer] Portability and Accountability Act (HIPAA) in connection with the good faith provision of telehealth during the COVID-19 national public health emergency. (AMA)

## 2018-07-27 ENCOUNTER — Inpatient Hospital Stay: Payer: BLUE CROSS/BLUE SHIELD | Attending: Oncology | Admitting: Oncology

## 2018-07-27 ENCOUNTER — Encounter: Payer: Self-pay | Admitting: Oncology

## 2018-07-27 ENCOUNTER — Other Ambulatory Visit: Payer: Self-pay

## 2018-07-27 DIAGNOSIS — Z79899 Other long term (current) drug therapy: Secondary | ICD-10-CM | POA: Diagnosis not present

## 2018-07-27 DIAGNOSIS — D649 Anemia, unspecified: Secondary | ICD-10-CM

## 2018-07-27 DIAGNOSIS — Z9884 Bariatric surgery status: Secondary | ICD-10-CM | POA: Diagnosis not present

## 2018-07-27 DIAGNOSIS — D509 Iron deficiency anemia, unspecified: Secondary | ICD-10-CM | POA: Insufficient documentation

## 2018-07-27 NOTE — Progress Notes (Signed)
Pt had gastric bypass, abdominal surgeries and ended up having abdominal adhesions along the way. Chronic constipation and uses miralax, senokot and lactulose. Tried iron pills and stopped because the constipation was so much worse and she already had it before taking iron pills. She has no pain at moment. Tired all the time and takes little naps during the day

## 2018-07-28 ENCOUNTER — Inpatient Hospital Stay: Payer: BLUE CROSS/BLUE SHIELD

## 2018-07-29 ENCOUNTER — Inpatient Hospital Stay: Payer: BLUE CROSS/BLUE SHIELD

## 2018-07-29 ENCOUNTER — Other Ambulatory Visit: Payer: Self-pay

## 2018-07-29 VITALS — BP 122/85 | HR 64 | Temp 96.8°F | Resp 18

## 2018-07-29 DIAGNOSIS — Z9884 Bariatric surgery status: Secondary | ICD-10-CM

## 2018-07-29 DIAGNOSIS — D509 Iron deficiency anemia, unspecified: Secondary | ICD-10-CM | POA: Diagnosis present

## 2018-07-29 DIAGNOSIS — Z79899 Other long term (current) drug therapy: Secondary | ICD-10-CM | POA: Diagnosis not present

## 2018-07-29 MED ORDER — SODIUM CHLORIDE 0.9 % IV SOLN
510.0000 mg | Freq: Once | INTRAVENOUS | Status: AC
  Administered 2018-07-29: 510 mg via INTRAVENOUS
  Filled 2018-07-29: qty 17

## 2018-07-29 MED ORDER — SODIUM CHLORIDE 0.9 % IV SOLN
Freq: Once | INTRAVENOUS | Status: AC
  Administered 2018-07-29: 15:00:00 via INTRAVENOUS
  Filled 2018-07-29: qty 250

## 2018-07-30 ENCOUNTER — Encounter: Payer: Self-pay | Admitting: Oncology

## 2018-07-30 NOTE — Progress Notes (Signed)
I connected with Diana Quinn on 07/30/18 at 10:45 AM EDT by video enabled telemedicine visit and verified that I am speaking with the correct person using two identifiers.   I discussed the limitations, risks, security and privacy concerns of performing an evaluation and management service by telemedicine and the availability of in-person appointments. I also discussed with the patient that there may be a patient responsible charge related to this service. The patient expressed understanding and agreed to proceed.  Other persons participating in the visit and their role in the encounter:  none  Patient's location:  home Provider's location:  home  Reason for referral: Anemia Referring provider Orson Evahelsa Boswell, NP  History of present illness: Patient is a 53 year old female with a history of gastric bypass procedure in 1999 and went revision surgery in 2004.  Interval history She has been referred to us for anemia.  Most recent CBC from 07/13/2018 showed white count of 5.8, H&H of 9.2/30.2 with MCV of 81.8 and a platelet count 539.  He has had some borderline microcytosis in the last 1 month and her MCV fluctuates between 79-81 and platelet count has been a high 500s.  TSH was normal.  Patient is chronic constipation as well as abdominal pain and has undergone multiple abdominal surgeries complicated by lesions and also sees GI for this.  She reports feeling tired most of the day.  She reports she has EGD nodule with Dr. Shelle Ironein in the near future.  She has had a colonoscopy several years ago.  She reports some weight loss in the last 2 weeks but overall her weight has remained stable.  Appetite is fair.  Denies any consistent use of NSAIDs.  Denies any blood in her stool or urine.  Denies any nosebleeds or gum bleeding.   Review of Systems  Constitutional: Positive for malaise/fatigue. Negative for chills, fever and weight loss.  HENT: Negative for congestion, ear discharge and nosebleeds.   Eyes:  Negative for blurred vision.  Respiratory: Negative for cough, hemoptysis, sputum production, shortness of breath and wheezing.   Cardiovascular: Negative for chest pain, palpitations, orthopnea and claudication.  Gastrointestinal: Negative for abdominal pain, blood in stool, constipation, diarrhea, heartburn, melena, nausea and vomiting.  Genitourinary: Negative for dysuria, flank pain, frequency, hematuria and urgency.  Musculoskeletal: Negative for back pain, joint pain and myalgias.  Skin: Negative for rash.  Neurological: Negative for dizziness, tingling, focal weakness, seizures, weakness and headaches.  Endo/Heme/Allergies: Does not bruise/bleed easily.  Psychiatric/Behavioral: Negative for depression and suicidal ideas. The patient does not have insomnia.     Allergies  Allergen Reactions  . Depakote [Divalproex Sodium] Shortness Of Breath  . Pregabalin Rash and Other (See Comments)    Other Reaction: falls; decreased coordination  . Valproic Acid Other (See Comments)    Other Reaction: Other reaction  . Levetiracetam Rash and Itching    Past Medical History:  Diagnosis Date  . Anemia   . Gastric bypass status for obesity   . H/O unstable angina   . Hypoglycemia   . MI (myocardial infarction) (HCC)   . Orthostatic hypotension   . Osteoporosis   . Sciatic nerve disease   . Seizures (HCC)   . Thyroid disease     Past Surgical History:  Procedure Laterality Date  . ABDOMINAL ADHESION SURGERY    . ABDOMINAL HYSTERECTOMY    . CHOLECYSTECTOMY    . DILATION AND CURETTAGE OF UTERUS    . GASTRIC BYPASS    . GASTRIC BYPASS  OPEN     revision  . GASTROSTOMY W/ FEEDING TUBE    . HERNIA REPAIR    . OOPHORECTOMY    . SPLENECTOMY    . TONSILLECTOMY      Social History   Socioeconomic History  . Marital status: Single    Spouse name: Not on file  . Number of children: Not on file  . Years of education: Not on file  . Highest education level: Not on file   Occupational History  . Not on file  Social Needs  . Financial resource strain: Not on file  . Food insecurity:    Worry: Not on file    Inability: Not on file  . Transportation needs:    Medical: Not on file    Non-medical: Not on file  Tobacco Use  . Smoking status: Never Smoker  . Smokeless tobacco: Never Used  Substance and Sexual Activity  . Alcohol use: Not Currently  . Drug use: Never  . Sexual activity: Yes  Lifestyle  . Physical activity:    Days per week: Not on file    Minutes per session: Not on file  . Stress: Not on file  Relationships  . Social connections:    Talks on phone: Not on file    Gets together: Not on file    Attends religious service: Not on file    Active member of club or organization: Not on file    Attends meetings of clubs or organizations: Not on file    Relationship status: Not on file  . Intimate partner violence:    Fear of current or ex partner: Not on file    Emotionally abused: Not on file    Physically abused: Not on file    Forced sexual activity: Not on file  Other Topics Concern  . Not on file  Social History Narrative  . Not on file    Family History  Problem Relation Age of Onset  . Cancer Mother   . Hypertension Mother   . COPD Mother   . Asthma Sister   . Psoriasis Sister   . Epilepsy Brother      Current Outpatient Medications:  .  acarbose (PRECOSE) 100 MG tablet, Take 100 mg by mouth 3 (three) times daily. With meals, Disp: , Rfl:  .  alendronate (FOSAMAX) 70 MG tablet, Take 70 mg by mouth once a week., Disp: , Rfl:  .  ARIPiprazole ER (ABILIFY MAINTENA) 400 MG PRSY prefilled syringe, Inject 400 mg into the muscle every 30 (thirty) days., Disp: , Rfl:  .  butalbital-acetaminophen-caffeine (FIORICET) 50-325-40 MG tablet, Take 1 tablet by mouth every 4 (four) hours as needed for pain., Disp: , Rfl:  .  Calcium Carb-Cholecalciferol (CALTRATE 600+D3) 600-800 MG-UNIT TABS, Take 1 tablet by mouth 2 (two) times  daily., Disp: , Rfl:  .  cetirizine (ZYRTEC) 10 MG tablet, Take 10 mg by mouth daily., Disp: , Rfl:  .  cyanocobalamin (,VITAMIN B-12,) 1000 MCG/ML injection, Inject 1,000 mcg into the muscle every 30 (thirty) days., Disp: , Rfl:  .  docusate sodium (COLACE) 100 MG capsule, Take 1 capsule (100 mg total) by mouth 2 (two) times daily., Disp: 10 capsule, Rfl: 0 .  ergocalciferol (VITAMIN D2) 1.25 MG (50000 UT) capsule, Take 1.25 mcg by mouth every 30 (thirty) days., Disp: , Rfl:  .  FYAVOLV 0.5-2.5 MG-MCG tablet, Take 1 tablet by mouth daily., Disp: , Rfl:  .  gabapentin (NEURONTIN) 400 MG capsule, Take  1 capsule (400 mg total) by mouth 3 (three) times daily for 30 days., Disp: 90 capsule, Rfl: 0 .  Galcanezumab-gnlm 120 MG/ML SOAJ, Inject 120 mg into the skin every 30 (thirty) days., Disp: , Rfl:  .  isosorbide dinitrate (ISORDIL) 30 MG tablet, Take 30 mg by mouth daily., Disp: , Rfl:  .  lactulose (CHRONULAC) 10 GM/15ML solution, Take 15 mLs by mouth as needed., Disp: , Rfl:  .  levothyroxine (LEVOXYL) 88 MCG tablet, Take 88 mcg by mouth daily., Disp: , Rfl:  .  nitroGLYCERIN (NITROSTAT) 0.4 MG SL tablet, Place 0.4 mg under the tongue as needed., Disp: , Rfl:  .  ondansetron (ZOFRAN) 8 MG tablet, Take 8 mg by mouth as needed., Disp: , Rfl:  .  Oxcarbazepine (TRILEPTAL) 300 MG tablet, Take 300 mg by mouth 2 (two) times daily., Disp: , Rfl:  .  oxyCODONE-acetaminophen (PERCOCET/ROXICET) 5-325 MG tablet, Take 1-2 tablets by mouth every 6 (six) hours as needed for moderate pain or severe pain., Disp: 15 tablet, Rfl: 0 .  polyethylene glycol (MIRALAX / GLYCOLAX) 17 g packet, Take 17 g by mouth 2 (two) times daily., Disp: 14 each, Rfl: 0 .  QUEtiapine (SEROQUEL) 50 MG tablet, Take 50 mg by mouth at bedtime., Disp: , Rfl:  .  ranolazine (RANEXA) 500 MG 12 hr tablet, Take 500 mg by mouth 2 (two) times daily., Disp: , Rfl:  .  rosuvastatin (CRESTOR) 5 MG tablet, Take 10 mg by mouth daily. , Disp: , Rfl:  .   senna (SENOKOT) 8.6 MG TABS tablet, Take 1 tablet (8.6 mg total) by mouth daily., Disp: 120 each, Rfl: 0 .  SUMAtriptan (IMITREX) 100 MG tablet, Take 100 mg by mouth as needed., Disp: , Rfl:  .  tiZANidine (ZANAFLEX) 4 MG tablet, Take 1 tablet (4 mg total) by mouth 3 (three) times daily., Disp: 90 tablet, Rfl: 2 .  [START ON 08/13/2018] traMADol (ULTRAM) 50 MG tablet, Take 1 tablet (50 mg total) by mouth every 12 (twelve) hours as needed for up to 30 days for severe pain. Month last 30 days., Disp: 60 tablet, Rfl: 0 .  zolpidem (AMBIEN) 5 MG tablet, Take 5 mg by mouth at bedtime., Disp: , Rfl:  .  ferrous sulfate 325 (65 FE) MG tablet, Take 325 mg by mouth 2 (two) times daily with a meal., Disp: , Rfl:   Dg Abd 1 View  Result Date: 07/16/2018 CLINICAL DATA:  Abdominal pain constipation. EXAM: ABDOMEN - 1 VIEW COMPARISON:  None. FINDINGS: Surgical clips are noted throughout the abdomen. Phleboliths are noted in the pelvis. No abnormal bowel dilatation is noted. Stool is noted in the right colon. IMPRESSION: No definite evidence of bowel obstruction or ileus. Electronically Signed   By: Lupita Raider M.D.   On: 07/16/2018 12:40   Ct Abdomen Pelvis W Contrast  Result Date: 07/11/2018 CLINICAL DATA:  Mid abdominal pain. History of pancreatitis and obstruction. History of gastric bypass surgery. Hernia repair. Splenectomy and right oophorectomy. EXAM: CT ABDOMEN AND PELVIS WITH CONTRAST TECHNIQUE: Multidetector CT imaging of the abdomen and pelvis was performed using the standard protocol following bolus administration of intravenous contrast. CONTRAST:  OMNIPAQUE IOHEXOL 300 MG/ML  SOLN COMPARISON:  None. FINDINGS: Lower chest: No acute abnormality. Hepatobiliary: The patient is status post cholecystectomy. Mild intra and extrahepatic biliary dilatation is likely due to previous cholecystectomy. No liver masses. The portal vein and its branches are patent. Pancreas: No pancreatic mass or evidence of  acute pancreatitis. Spleen: The patient is status post splenectomy. A small amount of splenic tissue appears to remain in the left upper quadrant. Adrenals/Urinary Tract: Adrenal glands are unremarkable. Kidneys are normal, without renal calculi, focal lesion, or hydronephrosis. Bladder is unremarkable. Stomach/Bowel: The patient is status post gastric bypass. The postoperative stomach is unremarkable. The small bowel is nondistended with no small bowel obstruction identified. Moderate to severe fecal loading is seen throughout the colon. The appendix is prominent caliber measuring up to 11 mm proximally. It is much smaller in caliber distally. There is no adjacent periappendiceal stranding. Vascular/Lymphatic: No significant vascular findings are present. No enlarged abdominal or pelvic lymph nodes. Reproductive: Status post hysterectomy. No adnexal masses. Other: No abdominal wall hernia or abnormality. No abdominopelvic ascites. Musculoskeletal: No acute or significant osseous findings. IMPRESSION: 1. The appendix is prominent in caliber along most of its length measuring up to 11 mm. However, there is no adjacent stranding. Also, the patient's white blood cell count is normal. Despite the prominence in caliber, suspicion for appendicitis is low based on today's imaging due to the lack of adjacent stranding and the lack of an elevated white blood cell count. Recommend clinical correlation and follow-up if clinically warranted. 2. Status post gastric bypass.  No small bowel obstruction. 3. Severe fecal loading throughout the length of the colon. 4. Intra and extrahepatic biliary duct dilatation is likely from previous cholecystectomy. Of note, the patient's bilirubin is normal today. 5. 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 Electronically Signed   By: Gerome Sam III M.D   On: 07/11/2018 23:17    No images are attached to the encounter.   CMP Latest Ref Rng &  Units 07/16/2018  Glucose 70 - 99 mg/dL 94  BUN 6 - 20 mg/dL 6  Creatinine 5.00 - 9.38 mg/dL 1.82  Sodium 993 - 716 mmol/L 127(L)  Potassium 3.5 - 5.1 mmol/L 3.8  Chloride 98 - 111 mmol/L 93(L)  CO2 22 - 32 mmol/L 27  Calcium 8.9 - 10.3 mg/dL 9.6(V)  Total Protein 6.5 - 8.1 g/dL -  Total Bilirubin 0.3 - 1.2 mg/dL -  Alkaline Phos 38 - 893 U/L -  AST 15 - 41 U/L -  ALT 0 - 44 U/L -   CBC Latest Ref Rng & Units 07/13/2018  WBC 4.0 - 10.5 K/uL 5.8  Hemoglobin 12.0 - 15.0 g/dL 8.1(O)  Hematocrit 17.5 - 46.0 % 30.2(L)  Platelets 150 - 400 K/uL 539(H)     Observation/objective: Appears in no acute stress of a video visit today.  Breathing is unlabored  Assessment and plan: Patient is a 53 year old female with a history of gastric bypass referred to [place] for normocytic anemia  I will be doing a complete anemia work-up at this time including a CBC with, CMP, ferritin and iron studies, B12, LDH, myeloma panel and free light chains, reticulocyte count and celiac disease panel.  Given history of gastric bypass and borderline microcytosis I suspect she has iron deficiency.  I recommend 2 doses of Feraheme 500 and milligrams IV weekly.  Discussed risks and benefits of Feraheme eluding all but not limited to headache, leg swelling and possible risk of infusion reaction.  Patient stands and agrees to proceed as planned.  Follow-up instructions: I will follow-up with the patient in 2 weeks time for video visit discuss the results of her bloodwork  I discussed the assessment and treatment plan with the patient. The patient was provided an opportunity  to ask questions and all were answered. The patient agreed with the plan and demonstrated an understanding of the instructions.   The patient was advised to call back or seek an in-person evaluation if the symptoms worsen or if the condition fails to improve as anticipated.   Visit Diagnosis: 1. Normocytic anemia   2. H/O gastric bypass   3. Iron  deficiency anemia, unspecified iron deficiency anemia type     Dr. Owens Shark, MD, MPH [facility] [phone] 07/30/2018 8:30 AM

## 2018-08-03 ENCOUNTER — Ambulatory Visit: Payer: BLUE CROSS/BLUE SHIELD

## 2018-08-05 ENCOUNTER — Inpatient Hospital Stay: Payer: BLUE CROSS/BLUE SHIELD

## 2018-08-05 ENCOUNTER — Other Ambulatory Visit: Payer: Self-pay

## 2018-08-05 DIAGNOSIS — D649 Anemia, unspecified: Secondary | ICD-10-CM

## 2018-08-05 DIAGNOSIS — D509 Iron deficiency anemia, unspecified: Secondary | ICD-10-CM | POA: Diagnosis not present

## 2018-08-05 LAB — CBC WITH DIFFERENTIAL/PLATELET
Abs Immature Granulocytes: 0.02 10*3/uL (ref 0.00–0.07)
Basophils Absolute: 0 10*3/uL (ref 0.0–0.1)
Basophils Relative: 1 %
Eosinophils Absolute: 0.2 10*3/uL (ref 0.0–0.5)
Eosinophils Relative: 3 %
HCT: 32.9 % — ABNORMAL LOW (ref 36.0–46.0)
Hemoglobin: 10.3 g/dL — ABNORMAL LOW (ref 12.0–15.0)
Immature Granulocytes: 0 %
Lymphocytes Relative: 28 %
Lymphs Abs: 2.2 10*3/uL (ref 0.7–4.0)
MCH: 25.4 pg — ABNORMAL LOW (ref 26.0–34.0)
MCHC: 31.3 g/dL (ref 30.0–36.0)
MCV: 81.2 fL (ref 80.0–100.0)
Monocytes Absolute: 0.9 10*3/uL (ref 0.1–1.0)
Monocytes Relative: 12 %
Neutro Abs: 4.4 10*3/uL (ref 1.7–7.7)
Neutrophils Relative %: 56 %
Platelets: 455 10*3/uL — ABNORMAL HIGH (ref 150–400)
RBC: 4.05 MIL/uL (ref 3.87–5.11)
RDW: 19.9 % — ABNORMAL HIGH (ref 11.5–15.5)
Smear Review: ADEQUATE
WBC: 7.7 10*3/uL (ref 4.0–10.5)
nRBC: 0 % (ref 0.0–0.2)

## 2018-08-05 LAB — IRON AND TIBC
Iron: 143 ug/dL (ref 28–170)
Saturation Ratios: 33 % — ABNORMAL HIGH (ref 10.4–31.8)
TIBC: 439 ug/dL (ref 250–450)
UIBC: 296 ug/dL

## 2018-08-05 LAB — COMPREHENSIVE METABOLIC PANEL
ALT: 16 U/L (ref 0–44)
AST: 22 U/L (ref 15–41)
Albumin: 3.9 g/dL (ref 3.5–5.0)
Alkaline Phosphatase: 141 U/L — ABNORMAL HIGH (ref 38–126)
Anion gap: 8 (ref 5–15)
BUN: 7 mg/dL (ref 6–20)
CO2: 24 mmol/L (ref 22–32)
Calcium: 8.3 mg/dL — ABNORMAL LOW (ref 8.9–10.3)
Chloride: 103 mmol/L (ref 98–111)
Creatinine, Ser: 0.79 mg/dL (ref 0.44–1.00)
GFR calc Af Amer: >60 mL/min (ref >60)
GFR calc non Af Amer: >60 mL/min (ref >60)
Glucose, Bld: 86 mg/dL (ref 70–99)
Potassium: 3.6 mmol/L (ref 3.5–5.1)
Sodium: 135 mmol/L (ref 135–145)
Total Bilirubin: 0.4 mg/dL (ref 0.3–1.2)
Total Protein: 6.9 g/dL (ref 6.5–8.1)

## 2018-08-05 LAB — RETICULOCYTES
Immature Retic Fract: 12.9 % (ref 2.3–15.9)
RBC.: 4.05 MIL/uL (ref 3.87–5.11)
Retic Count, Absolute: 45.8 10*3/uL (ref 19.0–186.0)
Retic Ct Pct: 1.1 % (ref 0.4–3.1)

## 2018-08-05 LAB — FERRITIN: Ferritin: 260 ng/mL (ref 11–307)

## 2018-08-05 LAB — LACTATE DEHYDROGENASE: LDH: 134 U/L (ref 98–192)

## 2018-08-06 ENCOUNTER — Other Ambulatory Visit: Payer: Self-pay

## 2018-08-06 ENCOUNTER — Inpatient Hospital Stay: Payer: BLUE CROSS/BLUE SHIELD

## 2018-08-06 VITALS — BP 101/71 | HR 77 | Temp 97.7°F | Resp 18

## 2018-08-06 DIAGNOSIS — D509 Iron deficiency anemia, unspecified: Secondary | ICD-10-CM

## 2018-08-06 DIAGNOSIS — Z9884 Bariatric surgery status: Secondary | ICD-10-CM

## 2018-08-06 LAB — MULTIPLE MYELOMA PANEL, SERUM
Albumin SerPl Elph-Mcnc: 3.3 g/dL (ref 2.9–4.4)
Albumin/Glob SerPl: 1.2 (ref 0.7–1.7)
Alpha 1: 0.3 g/dL (ref 0.0–0.4)
Alpha2 Glob SerPl Elph-Mcnc: 0.9 g/dL (ref 0.4–1.0)
B-Globulin SerPl Elph-Mcnc: 1 g/dL (ref 0.7–1.3)
Gamma Glob SerPl Elph-Mcnc: 0.7 g/dL (ref 0.4–1.8)
Globulin, Total: 2.8 g/dL (ref 2.2–3.9)
IgA: 202 mg/dL (ref 87–352)
IgG (Immunoglobin G), Serum: 672 mg/dL (ref 586–1602)
IgM (Immunoglobulin M), Srm: 72 mg/dL (ref 26–217)
Total Protein ELP: 6.1 g/dL (ref 6.0–8.5)

## 2018-08-06 LAB — KAPPA/LAMBDA LIGHT CHAINS
Kappa free light chain: 22.6 mg/L — ABNORMAL HIGH (ref 3.3–19.4)
Kappa, lambda light chain ratio: 1.44 (ref 0.26–1.65)
Lambda free light chains: 15.7 mg/L (ref 5.7–26.3)

## 2018-08-06 LAB — VITAMIN B12: Vitamin B-12: 303 pg/mL (ref 180–914)

## 2018-08-06 LAB — HAPTOGLOBIN: Haptoglobin: 236 mg/dL (ref 42–296)

## 2018-08-06 MED ORDER — SODIUM CHLORIDE 0.9 % IV SOLN
Freq: Once | INTRAVENOUS | Status: AC
  Administered 2018-08-06: 14:00:00 via INTRAVENOUS
  Filled 2018-08-06: qty 250

## 2018-08-06 MED ORDER — SODIUM CHLORIDE 0.9 % IV SOLN
510.0000 mg | Freq: Once | INTRAVENOUS | Status: AC
  Administered 2018-08-06: 510 mg via INTRAVENOUS
  Filled 2018-08-06: qty 17

## 2018-08-07 LAB — CELIAC DISEASE PANEL
Endomysial Ab, IgA: NEGATIVE
IgA: 208 mg/dL (ref 87–352)
Tissue Transglutaminase Ab, IgA: <2 U/mL (ref 0–3)

## 2018-08-09 ENCOUNTER — Other Ambulatory Visit: Payer: Self-pay

## 2018-08-09 ENCOUNTER — Encounter: Payer: Self-pay | Admitting: Oncology

## 2018-08-09 ENCOUNTER — Inpatient Hospital Stay: Payer: BC Managed Care – PPO | Attending: Oncology | Admitting: Oncology

## 2018-08-09 DIAGNOSIS — D509 Iron deficiency anemia, unspecified: Secondary | ICD-10-CM

## 2018-08-09 DIAGNOSIS — Z9884 Bariatric surgery status: Secondary | ICD-10-CM | POA: Diagnosis not present

## 2018-08-09 DIAGNOSIS — Z79899 Other long term (current) drug therapy: Secondary | ICD-10-CM

## 2018-08-09 NOTE — Progress Notes (Signed)
I connected with West Pugh on 08/09/18 at  9:45 AM EDT by video enabled telemedicine visit and verified that I am speaking with the correct person using two identifiers.   I discussed the limitations, risks, security and privacy concerns of performing an evaluation and management service by telemedicine and the availability of in-person appointments. I also discussed with the patient that there may be a patient responsible charge related to this service. The patient expressed understanding and agreed to proceed.  Other persons participating in the visit and their role in the encounter:  none  Patient's location:  home Provider's location:  home  Chief Complaint:  discuss results of bloodwork  History of present illness: Patient is a 53 year old female with a history of gastric bypass procedure in 1999 and went revision surgery in 2004.She has been referred to [place] for anemia.  Most recent CBC from 07/13/2018 showed white count of 5.8, H&H of 9.2/30.2 with MCV of 81.8 and a platelet count 539.  He has had some borderline microcytosis in the last 1 month and her MCV fluctuates between 79-81 and platelet count has been a high 500s.  TSH was normal.  Patient is chronic constipation as well as abdominal pain and has undergone multiple abdominal surgeries complicated by lesions and also sees GI for this.  She reports feeling tired most of the day.  She reports she has EGD nodule with Dr. Rayann Heman in the near future.  She has had a colonoscopy several years ago.  She reports some weight loss in the last 2 weeks but overall her weight has remained stable.    Given her history of gastric bypass and borderline microcytosis the etiology of her anemia was attributed to iron deficiency.Patient was supposed to get her labs checked before receiving Feraheme.  However that did not happen and labs were checked after 1 dose of Feraheme.  CBC on 08/05/2018 showed white count of 7.7, H&H of 10.3/32.9 with an MCV of 81 and a  platelet count of 455.  Ferritin levels were normal at 260.  Iron studies were normal.  Reticulocyte count was low for the degree of anemia indicating hypoproliferative anemia.  B12 level was normal at 303.  Haptoglobin was normal and LDH was normal.  CMP was normal.  Myeloma panel showed no M protein.  Free light chain ratio was normal and celiac testing was negative   Interval history: Patient has received 2 doses of Feraheme which he tolerated well without any significant side effects.  Overall feels well today and denied complaints at this time   Review of Systems  Constitutional: Negative for chills, fever, malaise/fatigue and weight loss.  HENT: Negative for congestion, ear discharge and nosebleeds.   Eyes: Negative for blurred vision.  Respiratory: Negative for cough, hemoptysis, sputum production, shortness of breath and wheezing.   Cardiovascular: Negative for chest pain, palpitations, orthopnea and claudication.  Gastrointestinal: Negative for abdominal pain, blood in stool, constipation, diarrhea, heartburn, melena, nausea and vomiting.  Genitourinary: Negative for dysuria, flank pain, frequency, hematuria and urgency.  Musculoskeletal: Negative for back pain, joint pain and myalgias.  Skin: Negative for rash.  Neurological: Negative for dizziness, tingling, focal weakness, seizures, weakness and headaches.  Endo/Heme/Allergies: Does not bruise/bleed easily.  Psychiatric/Behavioral: Negative for depression and suicidal ideas. The patient does not have insomnia.     Allergies  Allergen Reactions  . Depakote [Divalproex Sodium] Shortness Of Breath  . Pregabalin Rash and Other (See Comments)    Other Reaction: falls; decreased coordination  .  Valproic Acid Other (See Comments)    Other Reaction: Other reaction  . Levetiracetam Rash and Itching    Past Medical History:  Diagnosis Date  . Anemia   . Gastric bypass status for obesity   . H/O unstable angina   . Hypoglycemia    . MI (myocardial infarction) ([place])   . Orthostatic hypotension   . Osteoporosis   . Sciatic nerve disease   . Seizures ([place])   . Thyroid disease     Past Surgical History:  Procedure Laterality Date  . ABDOMINAL ADHESION SURGERY    . ABDOMINAL HYSTERECTOMY    . CHOLECYSTECTOMY    . DILATION AND CURETTAGE OF UTERUS    . GASTRIC BYPASS    . GASTRIC BYPASS OPEN     revision  . GASTROSTOMY W/ FEEDING TUBE    . HERNIA REPAIR    . OOPHORECTOMY    . SPLENECTOMY    . TONSILLECTOMY      Social History   Socioeconomic History  . Marital status: Single    Spouse name: Not on file  . Number of children: Not on file  . Years of education: Not on file  . Highest education level: Not on file  Occupational History  . Not on file  Social Needs  . Financial resource strain: Not on file  . Food insecurity:    Worry: Not on file    Inability: Not on file  . Transportation needs:    Medical: Not on file    Non-medical: Not on file  Tobacco Use  . Smoking status: Never Smoker  . Smokeless tobacco: Never Used  Substance and Sexual Activity  . Alcohol use: Not Currently  . Drug use: Never  . Sexual activity: Yes  Lifestyle  . Physical activity:    Days per week: Not on file    Minutes per session: Not on file  . Stress: Not on file  Relationships  . Social connections:    Talks on phone: Not on file    Gets together: Not on file    Attends religious service: Not on file    Active member of club or organization: Not on file    Attends meetings of clubs or organizations: Not on file    Relationship status: Not on file  . Intimate partner violence:    Fear of current or ex partner: Not on file    Emotionally abused: Not on file    Physically abused: Not on file    Forced sexual activity: Not on file  Other Topics Concern  . Not on file  Social History Narrative  . Not on file    Family History  Problem Relation Age of Onset  . Cancer Mother   . Hypertension Mother    . COPD Mother   . Asthma Sister   . Psoriasis Sister   . Epilepsy Brother      Current Outpatient Medications:  .  acarbose (PRECOSE) 100 MG tablet, Take 100 mg by mouth 3 (three) times daily. With meals, Disp: , Rfl:  .  alendronate (FOSAMAX) 70 MG tablet, Take 70 mg by mouth once a week., Disp: , Rfl:  .  ARIPiprazole ER (ABILIFY MAINTENA) 400 MG PRSY prefilled syringe, Inject 400 mg into the muscle every 30 (thirty) days., Disp: , Rfl:  .  butalbital-acetaminophen-caffeine (FIORICET) 50-325-40 MG tablet, Take 1 tablet by mouth every 4 (four) hours as needed for pain., Disp: , Rfl:  .  Calcium Carb-Cholecalciferol (CALTRATE  600+D3) 600-800 MG-UNIT TABS, Take 1 tablet by mouth 2 (two) times daily., Disp: , Rfl:  .  cetirizine (ZYRTEC) 10 MG tablet, Take 10 mg by mouth daily., Disp: , Rfl:  .  cyanocobalamin (,VITAMIN B-12,) 1000 MCG/ML injection, Inject 1,000 mcg into the muscle every 30 (thirty) days., Disp: , Rfl:  .  docusate sodium (COLACE) 100 MG capsule, Take 1 capsule (100 mg total) by mouth 2 (two) times daily., Disp: 10 capsule, Rfl: 0 .  ergocalciferol (VITAMIN D2) 1.25 MG (50000 UT) capsule, Take 1.25 mcg by mouth every 30 (thirty) days., Disp: , Rfl:  .  FYAVOLV 0.5-2.5 MG-MCG tablet, Take 1 tablet by mouth daily., Disp: , Rfl:  .  gabapentin (NEURONTIN) 400 MG capsule, Take 1 capsule (400 mg total) by mouth 3 (three) times daily for 30 days., Disp: 90 capsule, Rfl: 0 .  Galcanezumab-gnlm 120 MG/ML SOAJ, Inject 120 mg into the skin every 30 (thirty) days., Disp: , Rfl:  .  isosorbide dinitrate (ISORDIL) 30 MG tablet, Take 30 mg by mouth daily., Disp: , Rfl:  .  lactulose (CHRONULAC) 10 GM/15ML solution, Take 15 mLs by mouth as needed., Disp: , Rfl:  .  levothyroxine (LEVOXYL) 88 MCG tablet, Take 88 mcg by mouth daily., Disp: , Rfl:  .  linaclotide (LINZESS) 145 MCG CAPS capsule, Take 145 mcg by mouth daily before breakfast., Disp: , Rfl:  .  nitroGLYCERIN (NITROSTAT) 0.4 MG SL  tablet, Place 0.4 mg under the tongue as needed., Disp: , Rfl:  .  ondansetron (ZOFRAN) 8 MG tablet, Take 8 mg by mouth as needed., Disp: , Rfl:  .  Oxcarbazepine (TRILEPTAL) 300 MG tablet, Take 300 mg by mouth 2 (two) times daily., Disp: , Rfl:  .  oxyCODONE-acetaminophen (PERCOCET/ROXICET) 5-325 MG tablet, Take 1-2 tablets by mouth every 6 (six) hours as needed for moderate pain or severe pain., Disp: 15 tablet, Rfl: 0 .  pantoprazole (PROTONIX) 40 MG tablet, Take 40 mg by mouth daily., Disp: , Rfl:  .  polyethylene glycol (MIRALAX / GLYCOLAX) 17 g packet, Take 17 g by mouth 2 (two) times daily. (Patient taking differently: Take 17 g by mouth daily as needed. ), Disp: 14 each, Rfl: 0 .  QUEtiapine (SEROQUEL) 50 MG tablet, Take 50 mg by mouth at bedtime., Disp: , Rfl:  .  ranolazine (RANEXA) 500 MG 12 hr tablet, Take 500 mg by mouth 2 (two) times daily., Disp: , Rfl:  .  rosuvastatin (CRESTOR) 5 MG tablet, Take 10 mg by mouth daily. , Disp: , Rfl:  .  senna (SENOKOT) 8.6 MG TABS tablet, Take 1 tablet (8.6 mg total) by mouth daily., Disp: 120 each, Rfl: 0 .  SUMAtriptan (IMITREX) 100 MG tablet, Take 100 mg by mouth as needed., Disp: , Rfl:  .  tiZANidine (ZANAFLEX) 4 MG tablet, Take 1 tablet (4 mg total) by mouth 3 (three) times daily., Disp: 90 tablet, Rfl: 2 .  [START ON 08/13/2018] traMADol (ULTRAM) 50 MG tablet, Take 1 tablet (50 mg total) by mouth every 12 (twelve) hours as needed for up to 30 days for severe pain. Month last 30 days., Disp: 60 tablet, Rfl: 0 .  zolpidem (AMBIEN) 5 MG tablet, Take 5 mg by mouth at bedtime., Disp: , Rfl:   Dg Abd 1 View  Result Date: 07/16/2018 CLINICAL DATA:  Abdominal pain constipation. EXAM: ABDOMEN - 1 VIEW COMPARISON:  None. FINDINGS: Surgical clips are noted throughout the abdomen. Phleboliths are noted in the pelvis. No  abnormal bowel dilatation is noted. Stool is noted in the right colon. IMPRESSION: No definite evidence of bowel obstruction or ileus.  Electronically Signed   By: Marijo Conception M.D.   On: 07/16/2018 12:40   Ct Abdomen Pelvis W Contrast  Result Date: 07/11/2018 CLINICAL DATA:  Mid abdominal pain. History of pancreatitis and obstruction. History of gastric bypass surgery. Hernia repair. Splenectomy and right oophorectomy. EXAM: CT ABDOMEN AND PELVIS WITH CONTRAST TECHNIQUE: Multidetector CT imaging of the abdomen and pelvis was performed using the standard protocol following bolus administration of intravenous contrast. CONTRAST:  169m OMNIPAQUE IOHEXOL 300 MG/ML  SOLN COMPARISON:  None. FINDINGS: Lower chest: No acute abnormality. Hepatobiliary: The patient is status post cholecystectomy. Mild intra and extrahepatic biliary dilatation is likely due to previous cholecystectomy. No liver masses. The portal vein and its branches are patent. Pancreas: No pancreatic mass or evidence of acute pancreatitis. Spleen: The patient is status post splenectomy. A small amount of splenic tissue appears to remain in the left upper quadrant. Adrenals/Urinary Tract: Adrenal glands are unremarkable. Kidneys are normal, without renal calculi, focal lesion, or hydronephrosis. Bladder is unremarkable. Stomach/Bowel: The patient is status post gastric bypass. The postoperative stomach is unremarkable. The small bowel is nondistended with no small bowel obstruction identified. Moderate to severe fecal loading is seen throughout the colon. The appendix is prominent caliber measuring up to 11 mm proximally. It is much smaller in caliber distally. There is no adjacent periappendiceal stranding. Vascular/Lymphatic: No significant vascular findings are present. No enlarged abdominal or pelvic lymph nodes. Reproductive: Status post hysterectomy. No adnexal masses. Other: No abdominal wall hernia or abnormality. No abdominopelvic ascites. Musculoskeletal: No acute or significant osseous findings. IMPRESSION: 1. The appendix is prominent in caliber along most of its length  measuring up to 11 mm. However, there is no adjacent stranding. Also, the patient's white blood cell count is normal. Despite the prominence in caliber, suspicion for appendicitis is low based on today's imaging due to the lack of adjacent stranding and the lack of an elevated white blood cell count. Recommend clinical correlation and follow-up if clinically warranted. 2. Status post gastric bypass.  No small bowel obstruction. 3. Severe fecal loading throughout the length of the colon. 4. Intra and extrahepatic biliary duct dilatation is likely from previous cholecystectomy. Of note, the patient's bilirubin is normal today. 5. 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 Electronically Signed   By: DDorise BullionIII M.D   On: 07/11/2018 23:17    No images are attached to the encounter.   CMP Latest Ref Rng & Units 08/05/2018  Glucose 70 - 99 mg/dL 86  BUN 6 - 20 mg/dL 7  Creatinine 0.44 - 1.00 mg/dL 0.79  Sodium 135 - 145 mmol/L 135  Potassium 3.5 - 5.1 mmol/L 3.6  Chloride 98 - 111 mmol/L 103  CO2 22 - 32 mmol/L 24  Calcium 8.9 - 10.3 mg/dL 8.3(L)  Total Protein 6.5 - 8.1 g/dL 6.9  Total Bilirubin 0.3 - 1.2 mg/dL 0.4  Alkaline Phos 38 - 126 U/L 141(H)  AST 15 - 41 U/L 22  ALT 0 - 44 U/L 16   CBC Latest Ref Rng & Units 08/05/2018  WBC 4.0 - 10.5 K/uL 7.7  Hemoglobin 12.0 - 15.0 g/dL 10.3(L)  Hematocrit 36.0 - 46.0 % 32.9(L)  Platelets 150 - 400 K/uL 455(H)     Observation/objective: Appears in no acute distress over the video visit today.  Breathing is nonlabored  Assessment and plan: Patient is a 53 year old female with history of gastric bypass referred to [place] for microcytic/normocytic anemia probably secondary to iron deficiency  Unfortunately patient's labs were checked after she received 1 dose of Feraheme and therefore iron studies are uninterpretable.  Given her borderline microcytosis as well as history of gastric bypass, she likely has iron  deficiency and I would therefore wait to repeat her iron studies about 6 to 8 weeks from now.  If she continues to remain anemic despite getting Feraheme I will consider further work-up at that time.  Results of anemia work-up have otherwise been unremarkable.  There is no evidence of multiple myeloma or hemolysis.  B12 levels are normal.  Reticulocyte count is low normal indicating hypoproliferative anemia possibly secondary to iron deficiency  Follow-up instructions: Repeat CBC ferritin and iron studies in 6 weeks.  Video visit with MD 1 day later  I discussed the assessment and treatment plan with the patient. The patient was provided an opportunity to ask questions and all were answered. The patient agreed with the plan and demonstrated an understanding of the instructions.   The patient was advised to call back or seek an in-person evaluation if the symptoms worsen or if the condition fails to improve as anticipated.    Visit Diagnosis: 1. Iron deficiency anemia, unspecified iron deficiency anemia type   2. H/O gastric bypass     Dr. Randa Evens, MD, MPH [facility] [phone] 08/09/2018 9:28 AM

## 2018-08-09 NOTE — Progress Notes (Signed)
Pt to get lab results and plan based on labs doen on her first visit. No pain at this time

## 2018-08-10 ENCOUNTER — Inpatient Hospital Stay: Payer: BLUE CROSS/BLUE SHIELD | Admitting: Oncology

## 2018-08-11 ENCOUNTER — Ambulatory Visit
Admission: RE | Admit: 2018-08-11 | Discharge: 2018-08-11 | Disposition: A | Discharge status: Home / Self Care | Payer: BLUE CROSS/BLUE SHIELD | Source: Ambulatory Visit | Attending: Gastroenterology | Admitting: Gastroenterology

## 2018-08-11 ENCOUNTER — Other Ambulatory Visit: Payer: Self-pay

## 2018-08-11 DIAGNOSIS — R1013 Epigastric pain: Secondary | ICD-10-CM | POA: Diagnosis not present

## 2018-08-11 DIAGNOSIS — K838 Other specified diseases of biliary tract: Secondary | ICD-10-CM | POA: Diagnosis present

## 2018-08-24 ENCOUNTER — Encounter: Payer: Self-pay | Admitting: Pain Medicine

## 2018-08-24 NOTE — Progress Notes (Signed)
Pain Management Virtual Encounter Note - Virtual Visit via Telephone Telehealth (real-time audio visits between healthcare provider and patient).   Patient's Phone No. & Preferred Pharmacy:  [phone] (home); [phone] (mobile); (Preferred) [phone] [email or web address]  [id number] [address]oles Rough- , [place] - [address] [facility] [address] [place] [id number] Phone: [phone] Fax: [phone]    Pre-screening note:  Our staff contacted [name] and offered her an "in person", "face-to-face" appointment versus a telephone encounter. She indicated preferring the telephone encounter, at this time.   Reason for Virtual Visit: COVID-19*  Social distancing based on CDC and AMA recommendations.   I contacted Susa Dayicole G[place]borough on 08/25/2018 via telephone.      I clearly identified myself as Oswaldo DoneFrancisco A Shawntelle Ungar, MD. I verified that I was speaking with the correct person using two identifiers (Name: Susa Dayicole Monnig, and date of birth: 01/27/1966).  Advanced Informed Consent I sought verbal advanced consent from Susa DayNicole Mcclees for virtual visit interactions. I informed [name] of possible security and privacy concerns, risks, and limitations associated with providing "not-in-person" medical evaluation and management services. I also informed [name] of the availability of "in-person" appointments. Finally, I informed her that there would be a charge for the virtual visit and that she could be  personally, fully or partially, financially responsible for it. [name] expressed understanding and agreed to proceed.   Historic Elements   Ms. Susa Dayicole Hence is a 53 y.o. year old, female patient evaluated today after her last encounter by our practice on 07/26/2018. [name]  has a past medical history of Anemia, Gastric bypass status for obesity, H/O unstable angina, Hypoglycemia, MI (myocardial infarction) (HCC), Orthostatic  hypotension, Osteoporosis, Sciatic nerve disease, Seizures (HCC), and Thyroid disease. She also  has a past surgical history that includes Gastric bypass; Abdominal hysterectomy; Cholecystectomy; Tonsillectomy; Gastric bypass open; Oophorectomy; Hernia repair; Abdominal adhesion surgery; Gastrostomy w/ feeding tube; Dilation and curettage of uterus; and Splenectomy. [name] has a current medication list which includes the following prescription(s): acarbose, alendronate, aripiprazole er, butalbital-acetaminophen-caffeine, calcium carb-cholecalciferol, cetirizine, cyanocobalamin, docusate sodium, ergocalciferol, fyavolv, gabapentin, galcanezumab-gnlm, isosorbide dinitrate, lactulose, levothyroxine, linaclotide, nitroglycerin, ondansetron, oxcarbazepine, pantoprazole, quetiapine, ranolazine, rosuvastatin, senna, sumatriptan, tizanidine, tramadol, zolpidem, and polyethylene glycol. She  reports that she has never smoked. She has never used smokeless tobacco. She reports previous alcohol use. She reports that she does not use drugs. [name] is allergic to depakote [divalproex sodium]; pregabalin; valproic acid; and levetiracetam.   HPI  Today, she is being contacted for medication management.  The patient indicates that she is currently not having any problems with the tramadol, gabapentin, or Zanaflex.  Unfortunately, she has been experiencing more abdominal pain and she ended up in the hospital because of this.  She refers that the abdominal pain is secondary to multiple abdominal surgeries including a gastric bypass.  According to the patient the tramadol 50 mg twice daily does not seem to be enough to cover her pain, especially when she has these flareups.  In addition, she indicates that her low back pain and lower extremity pain is back in full force and in fact she has been experiencing some weakness on the left lower extremity that has led to some falls.  According to her, she had an MRI of the lumbar  spine [facility] [place], around December 2019.  The MRI results are not available on their care everywhere or under the [facility] system.  I have requested copies of this MRI so that I can take a look and see what is going on with her back.  Based on the patient's description she seems to be having primarily pain that is secondary to her facet syndrome with referred pain into the buttocks area.  She indicates that 90% of the time this pain does not go any further than the back of her thigh, which is typical of referred pain from the facet joints.  However, she has been experiencing some electrical-like sensations going down to the bottom of her foot and what seems to be an S1 dermatomal distribution.  This is the primary reason why I am trying to get a copy of the MRI to review the findings as the plain x-ray films did not reveal any significant inflammation.  Since the patient did get good relief of the pain with the first diagnostic lumbar facet block, but it did not last, I have scheduled her for a second diagnostic lumbar facet block.  Once again, the plan is that should she get good relief of the pain for at least a duration of the local anesthetic, she may be a good candidate for radiofrequency ablation.  Today I talked to the patient about this alternative and she is very excited about it.  In addition, she would like for [place] to eventually address her chronic abdominal pain, which quickly is getting to be her primary.  I spoke to the patient about the possibility of doing some celiac plexus blocks, should her current gastroenterologist and evaluating physicians determined that there is nothing else that they can offer her.  In recapping, today I will increase her tramadol and her gabapentin.  The tramadol to deal with the somatic component of her pain and the gabapentin to deal with her neuropathic component.  Pharmacotherapy Assessment  Analgesic: Tramadol 50 mg every twelve hours prn (Tramadol  100 mg/day).  Today we will increase this to 50 mg every 6 hours (200 mg/day) MME/day:10 mg/day.  Today we will increase this to 20 mg/day.  Monitoring: Pharmacotherapy: No side-effects or adverse reactions reported. [place] PMP: PDMP reviewed during this encounter.       Compliance: No problems identified. Effectiveness: Clinically acceptable. Plan: Refer to "POC".  Pertinent Labs   SAFETY SCREENING Profile Lab Results  Component Value Date   [place] NEGATIVE 07/12/2018   Renal Function Lab Results  Component Value Date   BUN 7 08/05/2018   CREATININE 0.79 08/05/2018   BCR 9 03/31/2018   GFRAA >60 08/05/2018   GFRNONAA >60 08/05/2018   Hepatic Function Lab Results  Component Value Date   AST 22 08/05/2018   ALT 16 08/05/2018   ALBUMIN 3.9 08/05/2018   UDS Summary  Date Value Ref Range Status  03/31/2018 FINAL  Final    Comment:    ==================================================================== TOXASSURE COMP DRUG ANALYSIS,UR ==================================================================== Test                             Result       Flag       Units Drug Present and Declared for Prescription Verification   7-aminoclonazepam              84           EXPECTED   ng/mg creat    7-aminoclonazepam is an expected metabolite of clonazepam. Source    of clonazepam is a scheduled prescription medication.   Butalbital  PRESENT      EXPECTED   Gabapentin                     PRESENT      EXPECTED   Oxcarbazepine MHD              PRESENT      EXPECTED    Oxcarbazepine MHD is the active metabolite of oxcarbazepine and    eslicarbazepine.   Quetiapine                     PRESENT      EXPECTED   Aripiprazole                   PRESENT      EXPECTED   Acetaminophen                  PRESENT      EXPECTED Drug Present not Declared for Prescription Verification   Baclofen                       PRESENT      UNEXPECTED   Ibuprofen                       PRESENT      UNEXPECTED Drug Absent but Declared for Prescription Verification   Oxycodone                      Not Detected UNEXPECTED ng/mg creat   Tizanidine                     Not Detected UNEXPECTED    Tizanidine, as indicated in the declared medication list, is not    always detected even when used as directed.   Salicylate                     Not Detected UNEXPECTED    Aspirin, as indicated in the declared medication list, is not    always detected even when used as directed. ==================================================================== Test                      Result    Flag   Units      Ref Range   Creatinine              102              mg/dL      >=40 ==================================================================== Declared Medications:  The flagging and interpretation on this report are based on the  following declared medications.  Unexpected results may arise from  inaccuracies in the declared medications.  **Note: The testing scope of this panel includes these medications:  Aripiprazole (Abilify)  Butalbital (Butalbital/APAP/Caffeine)  Clonazepam (Klonopin)  Gabapentin (Neurontin)  Oxcarbazepine (Trileptal)  Oxycodone (Percocet)  Quetiapine (Seroquel)  **Note: The testing scope of this panel does not include small to  moderate amounts of these reported medications:  Acetaminophen (Butalbital/APAP/Caffeine)  Acetaminophen (Percocet)  Aspirin  Tizanidine (Zanaflex)  **Note: The testing scope of this panel does not include following  reported medications:  Alendronate (Fosamax)  Caffeine (Butalbital/APAP/Caffeine)  Calcium (Calcium/Cholecalciferol)  Cholecalciferol (Calcium/Cholecalciferol)  Lactulose (Constulose)  Levothyroxine  Nitroglycerin (Nitrostat)  Ondansetron (Zofran)  Prednisone (Sterapred)  Rosuvastatin (Crestor)  Sumatriptan (Imitrex)  Vitamin B12  Vitamin D2 ==================================================================== For  clinical consultation, please call [phone]. ====================================================================  Note: Above Lab results reviewed.  Recent imaging  US Abdomen Complete CLINICAL DATA:  Epigastric pain.  EXAM: ABDOMEN ULTRASOUND COMPLETE  COMPARISON:  CT abdomen pelvis dated Jul 11, 2018.  FINDINGS: Gallbladder: Surgically absent.  Common bile duct: Diameter: 5 mm, normal.  Liver: No focal lesion identified. Within normal limits in parenchymal echogenicity. Portal vein is patent on color Doppler imaging with normal direction of blood flow towards the liver.  IVC: No abnormality visualized.  Pancreas: Not visualized due to overlying bowel gas.  Spleen: Surgically absent.  Right Kidney: Length: 10.2 cm. Echogenicity within normal limits. No mass or hydronephrosis visualized.  Left Kidney: Length: 10.4 cm. Echogenicity within normal limits. No mass or hydronephrosis visualized.  Abdominal aorta: No aneurysm visualized.  Other findings: None.  IMPRESSION: 1. No acute abnormality. 2. Prior cholecystectomy and splenectomy.  Electronically Signed   By: Obie DredgeWilliam T Derry M.D.   On: 08/11/2018 16:31  Assessment  The primary encounter diagnosis was Chronic pain syndrome. Diagnoses of Chronic low back pain (Primary Area of Pain) (Bilateral) (L>R) w/ sciatica, Chronic lower extremity pain (Secondary Area of Pain) (Bilateral) (L>R), Chronic neck pain (Tertiary Area of Pain) (Bilateral) (L>R), Chronic musculoskeletal pain, Neurogenic pain, Lumbar facet syndrome (Bilateral) (L>R), and Chronic abdominal pain were also pertinent to this visit.  Plan of Care  I have discontinued Joni Reiningicole Shirkey's oxyCODONE-acetaminophen. I have also changed her traMADol and gabapentin. Additionally, I am having her maintain her alendronate, ARIPiprazole ER, Calcium Carb-Cholecalciferol, cyanocobalamin, ergocalciferol, lactulose, levothyroxine, nitroGLYCERIN, Fyavolv, ondansetron,  Oxcarbazepine, QUEtiapine, rosuvastatin, SUMAtriptan, ranolazine, isosorbide dinitrate, cetirizine, acarbose, butalbital-acetaminophen-caffeine, Galcanezumab-gnlm, zolpidem, docusate sodium, polyethylene glycol, senna, linaclotide, pantoprazole, and tiZANidine.  Pharmacotherapy (Medications Ordered): Meds ordered this encounter  Medications  . tiZANidine (ZANAFLEX) 4 MG tablet    Sig: Take 1 tablet (4 mg total) by mouth 3 (three) times daily.    Dispense:  90 tablet    Refill:  2    Fill one day early if pharmacy is closed on scheduled refill date. May substitute for generic if available.  . traMADol (ULTRAM) 50 MG tablet    Sig: Take 1 tablet (50 mg total) by mouth every 6 (six) hours as needed for severe pain.    Dispense:  120 tablet    Refill:  0    Chronic Pain: STOP Act - Not applicable. Fill 1 day early if closed on scheduled refill date. Do not fill until: 08/29/2018. To last until: 09/28/2018. Instruct to avoid benzodiazepines within 8 hours of opioid.  Marland Kitchen. gabapentin (NEURONTIN) 400 MG capsule    Sig: Take 1 capsule (400 mg total) by mouth 4 (four) times daily.    Dispense:  120 capsule    Refill:  2    Fill one day early if pharmacy is closed on scheduled refill date. May substitute for generic if available.   Orders:  Orders Placed This Encounter  Procedures  . LUMBAR FACET(MEDIAL BRANCH NERVE BLOCK) MBNB    Standing Status:   Future    Standing Expiration Date:   09/24/2018    Scheduling Instructions:     Side: Bilateral     Level: L3-4, L4-5, & L5-S1 Facets (L2, L3, L4, L5, & S1 Medial Branch Nerves)     Sedation: Patient's choice.     Timeframe: ASAA    Order Specific Question:   Where will this procedure be performed?    Answer:   [facility]   Follow-up plan:   Return in about 13 weeks (around 11/24/2018) for (  VV), E/M (MM), in addition, Procedure (w/ sedation): (B) L-FCT BLK #2, (ASAP).    Recent Visits Date Type Provider Dept  07/26/18 Office Visit Barbette MerinoKing,  Crystal M, NP [facility]  06/28/18 Office Visit Barbette MerinoKing, Crystal M, NP [facility]  Showing recent visits within past 60 days and meeting all other requirements   Today's Visits Date Type Provider Dept  08/25/18 Office Visit Delano MetzNaveira, Chrislyn Seedorf, MD [facility]  Showing today's visits and meeting all other requirements   Future Appointments No visits were found meeting these conditions.  Showing future appointments within next 90 days and meeting all other requirements   I discussed the assessment and treatment plan with the patient. The patient was provided an opportunity to ask questions and all were answered. The patient agreed with the plan and demonstrated an understanding of the instructions.  Patient advised to call back or seek an in-person evaluation if the symptoms or condition worsens.  Total duration of non-face-to-face encounter: 20 minutes.  Note by: Oswaldo DoneFrancisco A Camree Wigington, MD Date: 08/25/2018; Time: 9:33 AM  Note: This dictation was prepared with Dragon dictation. Any transcriptional errors that may result from this process are unintentional.  Disclaimer:  * Given the special circumstances of the COVID-19 pandemic, the federal government has announced that the Office for Civil Rights (OCR) will exercise its enforcement discretion and will not impose penalties on physicians using telehealth in the event of noncompliance with regulatory requirements under the [employer] Insurance Portability and Accountability Act (HIPAA) in connection with the good faith provision of telehealth during the COVID-19 national public health emergency. (AMA)

## 2018-08-25 ENCOUNTER — Ambulatory Visit: Payer: BC Managed Care – PPO | Attending: Pain Medicine | Admitting: Pain Medicine

## 2018-08-25 ENCOUNTER — Telehealth: Payer: Self-pay | Admitting: *Deleted

## 2018-08-25 ENCOUNTER — Other Ambulatory Visit: Payer: Self-pay

## 2018-08-25 DIAGNOSIS — G8929 Other chronic pain: Secondary | ICD-10-CM

## 2018-08-25 DIAGNOSIS — M5442 Lumbago with sciatica, left side: Secondary | ICD-10-CM

## 2018-08-25 DIAGNOSIS — M79604 Pain in right leg: Secondary | ICD-10-CM | POA: Diagnosis not present

## 2018-08-25 DIAGNOSIS — M542 Cervicalgia: Secondary | ICD-10-CM

## 2018-08-25 DIAGNOSIS — G894 Chronic pain syndrome: Secondary | ICD-10-CM

## 2018-08-25 DIAGNOSIS — R109 Unspecified abdominal pain: Secondary | ICD-10-CM

## 2018-08-25 DIAGNOSIS — M5441 Lumbago with sciatica, right side: Secondary | ICD-10-CM

## 2018-08-25 DIAGNOSIS — M7918 Myalgia, other site: Secondary | ICD-10-CM

## 2018-08-25 DIAGNOSIS — M47816 Spondylosis without myelopathy or radiculopathy, lumbar region: Secondary | ICD-10-CM

## 2018-08-25 DIAGNOSIS — M792 Neuralgia and neuritis, unspecified: Secondary | ICD-10-CM

## 2018-08-25 DIAGNOSIS — M79605 Pain in left leg: Secondary | ICD-10-CM

## 2018-08-25 MED ORDER — GABAPENTIN 400 MG PO CAPS: 400.0000 mg | ORAL_CAPSULE | Freq: Four times a day (QID) | ORAL | 2 refills | Status: DC

## 2018-08-25 MED ORDER — TIZANIDINE HCL 4 MG PO TABS: 4.0000 mg | ORAL_TABLET | Freq: Three times a day (TID) | ORAL | 2 refills | Status: DC

## 2018-08-25 MED ORDER — TRAMADOL HCL 50 MG PO TABS: 50.0000 mg | ORAL_TABLET | Freq: Four times a day (QID) | ORAL | 0 refills | Status: DC | PRN

## 2018-08-25 NOTE — Patient Instructions (Signed)

## 2018-08-25 NOTE — Telephone Encounter (Signed)
PA sent with notes

## 2018-08-30 ENCOUNTER — Other Ambulatory Visit
Admission: RE | Admit: 2018-08-30 | Discharge: 2018-08-30 | Disposition: A | Discharge status: Home / Self Care | Payer: BC Managed Care – PPO | Source: Ambulatory Visit | Attending: Pain Medicine | Admitting: Pain Medicine

## 2018-08-30 ENCOUNTER — Other Ambulatory Visit: Payer: Self-pay

## 2018-08-30 DIAGNOSIS — Z1159 Encounter for screening for other viral diseases: Secondary | ICD-10-CM | POA: Insufficient documentation

## 2018-08-31 LAB — NOVEL CORONAVIRUS, NAA (HOSP ORDER, SEND-OUT TO REF LAB; TAT 18-24 HRS): SARS-CoV-2, NAA: NOT DETECTED

## 2018-09-02 ENCOUNTER — Ambulatory Visit
Admission: RE | Admit: 2018-09-02 | Discharge: 2018-09-02 | Disposition: A | Discharge status: Home / Self Care | Payer: BC Managed Care – PPO | Source: Ambulatory Visit | Attending: Pain Medicine | Admitting: Pain Medicine

## 2018-09-02 ENCOUNTER — Other Ambulatory Visit: Payer: Self-pay

## 2018-09-02 ENCOUNTER — Ambulatory Visit (HOSPITAL_BASED_OUTPATIENT_CLINIC_OR_DEPARTMENT_OTHER): Payer: BC Managed Care – PPO | Admitting: Pain Medicine

## 2018-09-02 ENCOUNTER — Encounter: Payer: Self-pay | Admitting: Pain Medicine

## 2018-09-02 VITALS — BP 152/98 | HR 92 | Temp 97.4°F | Resp 18 | Ht 63.0 in | Wt 190.0 lb

## 2018-09-02 DIAGNOSIS — M5136 Other intervertebral disc degeneration, lumbar region: Secondary | ICD-10-CM | POA: Insufficient documentation

## 2018-09-02 DIAGNOSIS — M4125 Other idiopathic scoliosis, thoracolumbar region: Secondary | ICD-10-CM

## 2018-09-02 DIAGNOSIS — M545 Low back pain, unspecified: Secondary | ICD-10-CM

## 2018-09-02 DIAGNOSIS — M47817 Spondylosis without myelopathy or radiculopathy, lumbosacral region: Secondary | ICD-10-CM | POA: Diagnosis present

## 2018-09-02 DIAGNOSIS — G8929 Other chronic pain: Secondary | ICD-10-CM

## 2018-09-02 DIAGNOSIS — M47816 Spondylosis without myelopathy or radiculopathy, lumbar region: Secondary | ICD-10-CM

## 2018-09-02 MED ORDER — LIDOCAINE HCL 2 % IJ SOLN
20.0000 mL | Freq: Once | INTRAMUSCULAR | Status: AC
  Administered 2018-09-02: 400 mg
  Filled 2018-09-02: qty 400

## 2018-09-02 MED ORDER — LACTATED RINGERS IV SOLN
1000.0000 mL | Freq: Once | INTRAVENOUS | Status: AC
  Administered 2018-09-02: 1000 mL via INTRAVENOUS

## 2018-09-02 MED ORDER — ROPIVACAINE HCL 2 MG/ML IJ SOLN
18.0000 mL | Freq: Once | INTRAMUSCULAR | Status: AC
  Administered 2018-09-02: 10 mL via PERINEURAL
  Filled 2018-09-02: qty 20

## 2018-09-02 MED ORDER — MIDAZOLAM HCL 5 MG/5ML IJ SOLN
1.0000 mg | INTRAMUSCULAR | Status: DC | PRN
  Administered 2018-09-02: 4 mg via INTRAVENOUS
  Filled 2018-09-02: qty 5

## 2018-09-02 MED ORDER — TRIAMCINOLONE ACETONIDE 40 MG/ML IJ SUSP
80.0000 mg | Freq: Once | INTRAMUSCULAR | Status: AC
  Administered 2018-09-02: 10:00:00 40 mg
  Filled 2018-09-02: qty 2

## 2018-09-02 MED ORDER — FENTANYL CITRATE (PF) 100 MCG/2ML IJ SOLN
25.0000 ug | INTRAMUSCULAR | Status: DC | PRN
  Administered 2018-09-02: 11:00:00 50 ug via INTRAVENOUS
  Filled 2018-09-02: qty 2

## 2018-09-02 NOTE — Patient Instructions (Signed)

## 2018-09-02 NOTE — Progress Notes (Signed)
Patient's Name: Diana Quinn  MRN: [id number]  Referring Provider: Delano Metz, MD  DOB: 1965/09/09  PCP: Fayrene Helper, NP  DOS: 09/02/2018  Note by: Oswaldo Done, MD  Service setting: Ambulatory outpatient  Specialty: Interventional Pain Management  Patient type: Established  Location: [facility] (AMB) [facility] Facility  Visit type: Interventional Procedure   Primary Reason for Visit: Interventional Pain Management Treatment. CC: Back Pain (left, lower)  Procedure:          Anesthesia, Analgesia, Anxiolysis:  Type: Lumbar Facet, Medial Branch Block(s) #2  Primary Purpose: Diagnostic Region: Posterolateral Lumbosacral Spine Level: L2, L3, L4, L5, & S1 Medial Branch Level(s). Injecting these levels blocks the L3-4, L4-5, and L5-S1 lumbar facet joints. Laterality: Bilateral  Type: Moderate (Conscious) Sedation combined with Local Anesthesia Indication(s): Analgesia and Anxiety Route: Intravenous (IV) IV Access: Secured Sedation: Meaningful verbal contact was maintained at all times during the procedure  Local Anesthetic: Lidocaine 1-2%  Position: Prone   Indications: 1. Lumbar facet syndrome (Bilateral) (L>R)   2. Spondylosis without myelopathy or radiculopathy, lumbosacral region   3. Lumbar spondylosis   4. Dextroconvex rotatory scoliosis   5. DDD (degenerative disc disease), lumbar   6. Chronic low back pain (Bilateral) (L>R) w/o sciatica    Pain Score: Pre-procedure: 7 /10 Post-procedure: 0-No pain/10  Pre-op Assessment:  Diana Quinn is a 53 y.o. (year old), female patient, seen today for interventional treatment. She  has a past surgical history that includes Gastric bypass; Abdominal hysterectomy; Cholecystectomy; Tonsillectomy; Gastric bypass open; Oophorectomy; Hernia repair; Abdominal adhesion surgery; Gastrostomy w/ feeding tube; Dilation and curettage of uterus; and Splenectomy. Diana Quinn has a current medication list which includes the following  prescription(s): acarbose, alendronate, aripiprazole er, butalbital-acetaminophen-caffeine, calcium carb-cholecalciferol, cetirizine, cyanocobalamin, docusate sodium, ergocalciferol, fyavolv, gabapentin, galcanezumab-gnlm, isosorbide dinitrate, lactulose, levothyroxine, linaclotide, nitroglycerin, ondansetron, oxcarbazepine, pantoprazole, polyethylene glycol, quetiapine, ranolazine, rosuvastatin, senna, sumatriptan, tizanidine, tramadol, and zolpidem, and the following Facility-Administered Medications: fentanyl and midazolam. Her primarily concern today is the Back Pain (left, lower)  Initial Vital Signs:  Pulse/HCG Rate: 92ECG Heart Rate: 83 Temp: 98 F (36.7 C) Resp: 18 BP: (!) 151/91 SpO2: 100 %  BMI: Estimated body mass index is 33.66 kg/m as calculated from the following:   Height as of this encounter:  (1.6 m).   Weight as of this encounter: 190 lb (86.2 kg).  Risk Assessment: Allergies: Reviewed. She is allergic to depakote [divalproex sodium]; pregabalin; valproic acid; and levetiracetam.  Allergy Precautions: None required Coagulopathies: Reviewed. None identified.  Blood-thinner therapy: None at this time Active Infection(s): Reviewed. None identified. Diana Quinn is afebrile  Site Confirmation: Diana Quinn was asked to confirm the procedure and laterality before marking the site Procedure checklist: Completed Consent: Before the procedure and under the influence of no sedative(s), amnesic(s), or anxiolytics, the patient was informed of the treatment options, risks and possible complications. To fulfill our ethical and legal obligations, as recommended by the American Medical Association's Code of Ethics, I have informed the patient of my clinical impression; the nature and purpose of the treatment or procedure; the risks, benefits, and possible complications of the intervention; the alternatives, including doing nothing; the risk(s) and benefit(s) of the alternative  treatment(s) or procedure(s); and the risk(s) and benefit(s) of doing nothing. The patient was provided information about the general risks and possible complications associated with the procedure. These may include, but are not limited to: failure to achieve desired goals, infection, bleeding, organ or nerve damage, allergic reactions, paralysis,  and death. In addition, the patient was informed of those risks and complications associated to Spine-related procedures, such as failure to decrease pain; infection (i.e.: Meningitis, epidural or intraspinal abscess); bleeding (i.e.: epidural hematoma, subarachnoid hemorrhage, or any other type of intraspinal or peri-dural bleeding); organ or nerve damage (i.e.: Any type of peripheral nerve, nerve root, or spinal cord injury) with subsequent damage to sensory, motor, and/or autonomic systems, resulting in permanent pain, numbness, and/or weakness of one or several areas of the body; allergic reactions; (i.e.: anaphylactic reaction); and/or death. Furthermore, the patient was informed of those risks and complications associated with the medications. These include, but are not limited to: allergic reactions (i.e.: anaphylactic or anaphylactoid reaction(s)); adrenal axis suppression; blood sugar elevation that in diabetics may result in ketoacidosis or comma; water retention that in patients with history of congestive heart failure may result in shortness of breath, pulmonary edema, and decompensation with resultant heart failure; weight gain; swelling or edema; medication-induced neural toxicity; particulate matter embolism and blood vessel occlusion with resultant organ, and/or nervous system infarction; and/or aseptic necrosis of one or more joints. Finally, the patient was informed that Medicine is not an exact science; therefore, there is also the possibility of unforeseen or unpredictable risks and/or possible complications that may result in a catastrophic  outcome. The patient indicated having understood very clearly. We have given the patient no guarantees and we have made no promises. Enough time was given to the patient to ask questions, all of which were answered to the patient's satisfaction. Ms. Crowell has indicated that she wanted to continue with the procedure. Attestation: I, the ordering provider, attest that I have discussed with the patient the benefits, risks, side-effects, alternatives, likelihood of achieving goals, and potential problems during recovery for the procedure that I have provided informed consent. Date   Time: 09/02/2018  9:57 AM  Pre-Procedure Preparation:  Monitoring: As per clinic protocol. Respiration, ETCO2, SpO2, BP, heart rate and rhythm monitor placed and checked for adequate function Safety Precautions: Patient was assessed for positional comfort and pressure points before starting the procedure. Time-out: I initiated and conducted the "Time-out" before starting the procedure, as per protocol. The patient was asked to participate by confirming the accuracy of the "Time Out" information. Verification of the correct person, site, and procedure were performed and confirmed by me, the nursing staff, and the patient. "Time-out" conducted as per Joint Commission's Universal Protocol (UP.01.01.01). Time: 1024  Description of Procedure:          Laterality: Bilateral. The procedure was performed in identical fashion on both sides. Levels:  L2, L3, L4, L5, & S1 Medial Branch Level(s) Area Prepped: Posterior Lumbosacral Region Prepping solution: DuraPrep (Iodine Povacrylex [0.7% available iodine] and Isopropyl Alcohol, 74% w/w) Safety Precautions: Aspiration looking for blood return was conducted prior to all injections. At no point did we inject any substances, as a needle was being advanced. Before injecting, the patient was told to immediately notify me if she was experiencing any new onset of "ringing in the ears, or  metallic taste in the mouth". No attempts were made at seeking any paresthesias. Safe injection practices and needle disposal techniques used. Medications properly checked for expiration dates. SDV (single dose vial) medications used. After the completion of the procedure, all disposable equipment used was discarded in the proper designated medical waste containers. Local Anesthesia: Protocol guidelines were followed. The patient was positioned over the fluoroscopy table. The area was prepped in the usual manner. The time-out was  completed. The target area was identified using fluoroscopy. A 12-in long, straight, sterile hemostat was used with fluoroscopic guidance to locate the targets for each level blocked. Once located, the skin was marked with an approved surgical skin marker. Once all sites were marked, the skin (epidermis, dermis, and hypodermis), as well as deeper tissues (fat, connective tissue and muscle) were infiltrated with a small amount of a short-acting local anesthetic, loaded on a 10cc syringe with a 25G, 1.5-in  Needle. An appropriate amount of time was allowed for local anesthetics to take effect before proceeding to the next step. Local Anesthetic: Lidocaine 2.0% The unused portion of the local anesthetic was discarded in the proper designated containers. Technical explanation of process:  L2 Medial Branch Nerve Block (MBB): The target area for the L2 medial branch is at the junction of the postero-lateral aspect of the superior articular process and the superior, posterior, and medial edge of the transverse process of L3. Under fluoroscopic guidance, a Quincke needle was inserted until contact was made with os over the superior postero-lateral aspect of the pedicular shadow (target area). After negative aspiration for blood, 0.5 mL of the nerve block solution was injected without difficulty or complication. The needle was removed intact. L3 Medial Branch Nerve Block (MBB): The target area  for the L3 medial branch is at the junction of the postero-lateral aspect of the superior articular process and the superior, posterior, and medial edge of the transverse process of L4. Under fluoroscopic guidance, a Quincke needle was inserted until contact was made with os over the superior postero-lateral aspect of the pedicular shadow (target area). After negative aspiration for blood, 0.5 mL of the nerve block solution was injected without difficulty or complication. The needle was removed intact. L4 Medial Branch Nerve Block (MBB): The target area for the L4 medial branch is at the junction of the postero-lateral aspect of the superior articular process and the superior, posterior, and medial edge of the transverse process of L5. Under fluoroscopic guidance, a Quincke needle was inserted until contact was made with os over the superior postero-lateral aspect of the pedicular shadow (target area). After negative aspiration for blood, 0.5 mL of the nerve block solution was injected without difficulty or complication. The needle was removed intact. L5 Medial Branch Nerve Block (MBB): The target area for the L5 medial branch is at the junction of the postero-lateral aspect of the superior articular process and the superior, posterior, and medial edge of the sacral ala. Under fluoroscopic guidance, a Quincke needle was inserted until contact was made with os over the superior postero-lateral aspect of the pedicular shadow (target area). After negative aspiration for blood, 0.5 mL of the nerve block solution was injected without difficulty or complication. The needle was removed intact. S1 Medial Branch Nerve Block (MBB): The target area for the S1 medial branch is at the posterior and inferior 6 o'clock position of the L5-S1 facet joint. Under fluoroscopic guidance, the Quincke needle inserted for the L5 MBB was redirected until contact was made with os over the inferior and postero aspect of the sacrum, at the  6 o' clock position under the L5-S1 facet joint (Target area). After negative aspiration for blood, 0.5 mL of the nerve block solution was injected without difficulty or complication. The needle was removed intact.  Nerve block solution: 0.2% PF-Ropivacaine + Triamcinolone (40 mg/mL) diluted to a final concentration of 4 mg of Triamcinolone/mL of Ropivacaine The unused portion of the solution was discarded in the  proper designated containers. Procedural Needles: 22-gauge, 3.5-inch, Quincke needles used for all levels.  Once the entire procedure was completed, the treated area was cleaned, making sure to leave some of the prepping solution back to take advantage of its long term bactericidal properties.   Illustration of the posterior view of the lumbar spine and the posterior neural structures. Laminae of L2 through S1 are labeled. DPRL5, dorsal primary ramus of L5; DPRS1, dorsal primary ramus of S1; DPR3, dorsal primary ramus of L3; FJ, facet (zygapophyseal) joint L3-L4; I, inferior articular process of L4; LB1, lateral branch of dorsal primary ramus of L1; IAB, inferior articular branches from L3 medial branch (supplies L4-L5 facet joint); IBP, intermediate branch plexus; MB3, medial branch of dorsal primary ramus of L3; NR3, third lumbar nerve root; S, superior articular process of L5; SAB, superior articular branches from L4 (supplies L4-5 facet joint also); TP3, transverse process of L3.  Vitals:   09/02/18 1036 09/02/18 1046 09/02/18 1056 09/02/18 1106  BP: (!) 151/96 (!) 158/97 (!) 149/130 (!) 152/98  Pulse:      Resp: 13 (!) 24 18 18   Temp:  (!) 97.4 F (36.3 C)  (!) 97.4 F (36.3 C)  TempSrc:      SpO2: 100% 100% 100% 100%  Weight:      Height:         Start Time: 1024 hrs. End Time: 1036 hrs.  Imaging Guidance (Spinal):          Type of Imaging Technique: Fluoroscopy Guidance (Spinal) Indication(s): Assistance in needle guidance and placement for procedures requiring needle  placement in or near specific anatomical locations not easily accessible without such assistance. Exposure Time: Please see nurses notes. Contrast: None used. Fluoroscopic Guidance: I was personally present during the use of fluoroscopy. "Tunnel Vision Technique" used to obtain the best possible view of the target area. Parallax error corrected before commencing the procedure. "Direction-depth-direction" technique used to introduce the needle under continuous pulsed fluoroscopy. Once target was reached, antero-posterior, oblique, and lateral fluoroscopic projection used confirm needle placement in all planes. Images permanently stored in EMR. Interpretation: No contrast injected. I personally interpreted the imaging intraoperatively. Adequate needle placement confirmed in multiple planes. Permanent images saved into the patient's record.  Antibiotic Prophylaxis:   Anti-infectives (From admission, onward)   None     Indication(s): None identified  Post-operative Assessment:  Post-procedure Vital Signs:  Pulse/HCG Rate: 9280 Temp: (!) 97.4 F (36.3 C) Resp: 18 BP: (!) 152/98 SpO2: 100 %  EBL: None  Complications: No immediate post-treatment complications observed by team, or reported by patient.  Note: The patient tolerated the entire procedure well. A repeat set of vitals were taken after the procedure and the patient was kept under observation following institutional policy, for this type of procedure. Post-procedural neurological assessment was performed, showing return to baseline, prior to discharge. The patient was provided with post-procedure discharge instructions, including a section on how to identify potential problems. Should any problems arise concerning this procedure, the patient was given instructions to immediately contact us, at any time, without hesitation. In any case, we plan to contact the patient by telephone for a follow-up status report regarding this interventional  procedure.  Comments:  No additional relevant information.  Plan of Care  Orders:  Orders Placed This Encounter  Procedures   LUMBAR FACET(MEDIAL BRANCH NERVE BLOCK) MBNB    Scheduling Instructions:     Side: Bilateral     Level: L3-4, L4-5, & L5-S1 Facets (L2,  L3, L4, L5, & S1 Medial Branch Nerves)     Sedation: With Sedation.     Timeframe: Today    Order Specific Question:   Where will this procedure be performed?    Answer:   [facility]   DG PAIN [facility] C-ARM 1-60 MIN NO REPORT    Intraoperative interpretation [facility].    Standing Status:   Standing    Number of Occurrences:   1    Order Specific Question:   Reason for exam:    Answer:   Assistance in needle guidance and placement for procedures requiring needle placement in or near specific anatomical locations not easily accessible without such assistance.   Provider attestation of informed consent for procedure/surgical case    I, the ordering provider, attest that I have discussed with the patient the benefits, risks, side effects, alternatives, likelihood of achieving goals and potential problems during recovery for the procedure that I have provided informed consent.    Standing Status:   Standing    Number of Occurrences:   1   Informed Consent Details: Transcribe to consent form and obtain patient signature    Standing Status:   Standing    Number of Occurrences:   1    Order Specific Question:   Procedure    Answer:   Bilateral Lumbar facet block (medial branch block) under fluoroscopic guidance. (See notes for levels.)    Order Specific Question:   Surgeon    Answer:   Syona Wroblewski A. Laban EmperorNaveira, MD    Order Specific Question:   Indication/Reason    Answer:   Bilateral low back pain with or without lower extremity pain   Medications ordered for procedure: Meds ordered this encounter  Medications   lidocaine (XYLOCAINE) 2 % (with pres) injection 400 mg   lactated  ringers infusion 1,000 mL   midazolam (VERSED) 5 MG/5ML injection 1-2 mg    Make sure Flumazenil is available in the pyxis when using this medication. If oversedation occurs, administer 0.2 mg IV over 15 sec. If after 45 sec no response, administer 0.2 mg again over 1 min; may repeat at 1 min intervals; not to exceed 4 doses (1 mg)   fentaNYL (SUBLIMAZE) injection 25-50 mcg    Make sure Narcan is available in the pyxis when using this medication. In the event of respiratory depression (RR< 8/min): Titrate NARCAN (naloxone) in increments of 0.1 to 0.2 mg IV at 2-3 minute intervals, until desired degree of reversal.   ropivacaine (PF) 2 mg/mL (0.2%) (NAROPIN) injection 18 mL   triamcinolone acetonide (KENALOG-40) injection 80 mg   Medications administered: We administered lidocaine, lactated ringers, midazolam, fentaNYL, ropivacaine (PF) 2 mg/mL (0.2%), and triamcinolone acetonide.  See the medical record for exact dosing, route, and time of administration.  Disposition: Discharge home  Discharge Date & Time: 09/02/2018; 1107 hrs.   Follow-up plan:   Return in about 2 weeks (around 09/16/2018) for (VV), E/M (PP).     Recent Visits Date Type Provider Dept  08/25/18 Office Visit Delano MetzNaveira, Charnise Lovan, MD [facility]  07/26/18 Office Visit Barbette MerinoKing, Crystal M, NP [facility]  06/28/18 Office Visit Barbette MerinoKing, Crystal M, NP [facility]  Showing recent visits within past 90 days and meeting all other requirements   Today's Visits Date Type Provider Dept  09/02/18 Procedure visit Delano MetzNaveira, Edna Rede, MD [facility]  Showing today's visits and meeting all other requirements   Future Appointments Date  Type Provider Dept  10/04/18 Appointment Milinda Pointer, MD [facility]  11/10/18 Appointment Milinda Pointer, MD [facility]  Showing future appointments within next 90 days and meeting all other requirements   Primary Care Physician:  Danelle Berry, NP Location: [facility] Note by: Gaspar Cola, MD Date: 09/02/2018; Time: 11:56 AM  Disclaimer:  Medicine is not an Chief Strategy Officer. The only guarantee in medicine is that nothing is guaranteed. It is important to note that the decision to proceed with this intervention was based on the information collected from the patient. The Data and conclusions were drawn from the patient's questionnaire, the interview, and the physical examination. Because the information was provided in large part by the patient, it cannot be guaranteed that it has not been purposely or unconsciously manipulated. Every effort has been made to obtain as much relevant data as possible for this evaluation. It is important to note that the conclusions that lead to this procedure are derived in large part from the available data. Always take into account that the treatment will also be dependent on availability of resources and existing treatment guidelines, considered by other Pain Management Practitioners as being common knowledge and practice, at the time of the intervention. For Medico-Legal purposes, it is also important to point out that variation in procedural techniques and pharmacological choices are the acceptable norm. The indications, contraindications, technique, and results of the above procedure should only be interpreted and judged by a Board-Certified Interventional Pain Specialist with extensive familiarity and expertise in the same exact procedure and technique.

## 2018-09-03 ENCOUNTER — Telehealth: Payer: Self-pay

## 2018-09-03 NOTE — Telephone Encounter (Signed)
Post procedure phone call. Patient states he is doing well.  

## 2018-09-20 ENCOUNTER — Other Ambulatory Visit: Payer: Self-pay

## 2018-09-20 ENCOUNTER — Inpatient Hospital Stay: Payer: BC Managed Care – PPO | Attending: Oncology

## 2018-09-20 DIAGNOSIS — Z79899 Other long term (current) drug therapy: Secondary | ICD-10-CM | POA: Insufficient documentation

## 2018-09-20 DIAGNOSIS — Z9884 Bariatric surgery status: Secondary | ICD-10-CM | POA: Diagnosis not present

## 2018-09-20 DIAGNOSIS — D509 Iron deficiency anemia, unspecified: Secondary | ICD-10-CM | POA: Insufficient documentation

## 2018-09-20 LAB — CBC
HCT: 34.2 % — ABNORMAL LOW (ref 36.0–46.0)
Hemoglobin: 11.1 g/dL — ABNORMAL LOW (ref 12.0–15.0)
MCH: 30 pg (ref 26.0–34.0)
MCHC: 32.5 g/dL (ref 30.0–36.0)
MCV: 92.4 fL (ref 80.0–100.0)
Platelets: 318 10*3/uL (ref 150–400)
RBC: 3.7 MIL/uL — ABNORMAL LOW (ref 3.87–5.11)
RDW: 23.9 % — ABNORMAL HIGH (ref 11.5–15.5)
WBC: 12.5 10*3/uL — ABNORMAL HIGH (ref 4.0–10.5)
nRBC: 0 % (ref 0.0–0.2)

## 2018-09-20 LAB — IRON AND TIBC
Iron: 133 ug/dL (ref 28–170)
Saturation Ratios: 38 % — ABNORMAL HIGH (ref 10.4–31.8)
TIBC: 351 ug/dL (ref 250–450)
UIBC: 218 ug/dL

## 2018-09-20 LAB — FERRITIN: Ferritin: 76 ng/mL (ref 11–307)

## 2018-09-21 ENCOUNTER — Inpatient Hospital Stay (HOSPITAL_BASED_OUTPATIENT_CLINIC_OR_DEPARTMENT_OTHER): Payer: BC Managed Care – PPO | Admitting: Oncology

## 2018-09-21 ENCOUNTER — Encounter: Payer: Self-pay | Admitting: Oncology

## 2018-09-21 DIAGNOSIS — D509 Iron deficiency anemia, unspecified: Secondary | ICD-10-CM | POA: Diagnosis not present

## 2018-09-21 DIAGNOSIS — Z9884 Bariatric surgery status: Secondary | ICD-10-CM

## 2018-09-21 NOTE — Progress Notes (Signed)
I connected with Diana Quinn on 09/21/18 at  9:30 AM EDT by video enabled telemedicine visit and verified that I am speaking with the correct person using two identifiers.   I discussed the limitations, risks, security and privacy concerns of performing an evaluation and management service by telemedicine and the availability of in-person appointments. I also discussed with the patient that there may be a patient responsible charge related to this service. The patient expressed understanding and agreed to proceed.  Other persons participating in the visit and their role in the encounter:  none  Patient's location:  home Provider's location:  work  Stage managerChief Complaint: Routine follow-up of iron deficiency anemia  History of present illness: Patient is a 53 year old female with a history of gastric bypass procedure in 1999 and went revision surgery in 2004.She has been referred to us for anemia. Most recent CBC from 07/13/2018 showed white count of 5.8, H&H of 9.2/30.2 with MCV of 81.8 and a platelet count 539. He has had some borderline microcytosis in the last 1 month and her MCV fluctuates between 79-81 and platelet count has been a high 500s. TSH was normal. Patient is chronic constipation as well as abdominal pain and has undergone multiple abdominal surgeries complicated by lesions and also sees GI for this. She reports feeling tired most of the day.She reports she has EGD nodule with Dr. Bobbye Charlestoneinin the near future.She has had a colonoscopy several years ago. She reports some weight loss in the last 2 weeks but overall her weight has remained stable.   Given her history of gastric bypass and borderline microcytosis the etiology of her anemia was attributed to iron deficiency.Patient was supposed to get her labs checked before receiving Feraheme.  However that did not happen and labs were checked after 1 dose of Feraheme.  CBC on 08/05/2018 showed white count of 7.7, H&H of 10.3/32.9 with an MCV  of 81 and a platelet count of 455.  Ferritin levels were normal at 260.  Iron studies were normal.  Reticulocyte count was low for the degree of anemia indicating hypoproliferative anemia.  B12 level was normal at 303.  Haptoglobin was normal and LDH was normal.  CMP was normal.  Myeloma panel showed no M protein.  Free light chain ratio was normal and celiac testing was negative.  Patient received 2 doses of Feraheme in June 2020  Interval history overall patient feels well and denies any complaints at this time.  Reports her energy levels have improved   Review of Systems  Constitutional: Negative for chills, fever, malaise/fatigue and weight loss.  HENT: Negative for congestion, ear discharge and nosebleeds.   Eyes: Negative for blurred vision.  Respiratory: Negative for cough, hemoptysis, sputum production, shortness of breath and wheezing.   Cardiovascular: Negative for chest pain, palpitations, orthopnea and claudication.  Gastrointestinal: Negative for abdominal pain, blood in stool, constipation, diarrhea, heartburn, melena, nausea and vomiting.  Genitourinary: Negative for dysuria, flank pain, frequency, hematuria and urgency.  Musculoskeletal: Negative for back pain, joint pain and myalgias.  Skin: Negative for rash.  Neurological: Negative for dizziness, tingling, focal weakness, seizures, weakness and headaches.  Endo/Heme/Allergies: Does not bruise/bleed easily.  Psychiatric/Behavioral: Negative for depression and suicidal ideas. The patient does not have insomnia.     Allergies  Allergen Reactions  . Depakote [Divalproex Sodium] Shortness Of Breath  . Pregabalin Rash and Other (See Comments)    Other Reaction: falls; decreased coordination  . Valproic Acid Other (See Comments)    Other  Reaction: Other reaction  . Levetiracetam Rash and Itching    Past Medical History:  Diagnosis Date  . Anemia   . Gastric bypass status for obesity   . H/O unstable angina   .  Hypoglycemia   . MI (myocardial infarction) (HCC)   . Orthostatic hypotension   . Osteoporosis   . Sciatic nerve disease   . Seizures (HCC)   . Thyroid disease     Past Surgical History:  Procedure Laterality Date  . ABDOMINAL ADHESION SURGERY    . ABDOMINAL HYSTERECTOMY    . CHOLECYSTECTOMY    . DILATION AND CURETTAGE OF UTERUS    . ESOPHAGOGASTRODUODENOSCOPY    . GASTRIC BYPASS    . GASTRIC BYPASS OPEN     revision  . GASTROSTOMY W/ FEEDING TUBE    . HERNIA REPAIR    . OOPHORECTOMY    . SPLENECTOMY    . TONSILLECTOMY      Social History   Socioeconomic History  . Marital status: Single    Spouse name: Not on file  . Number of children: Not on file  . Years of education: Not on file  . Highest education level: Not on file  Occupational History  . Not on file  Social Needs  . Financial resource strain: Not on file  . Food insecurity    Worry: Not on file    Inability: Not on file  . Transportation needs    Medical: Not on file    Non-medical: Not on file  Tobacco Use  . Smoking status: Never Smoker  . Smokeless tobacco: Never Used  Substance and Sexual Activity  . Alcohol use: Not Currently  . Drug use: Never  . Sexual activity: Yes  Lifestyle  . Physical activity    Days per week: Not on file    Minutes per session: Not on file  . Stress: Not on file  Relationships  . Social Musicianconnections    Talks on phone: Not on file    Gets together: Not on file    Attends religious service: Not on file    Active member of club or organization: Not on file    Attends meetings of clubs or organizations: Not on file    Relationship status: Not on file  . Intimate partner violence    Fear of current or ex partner: Not on file    Emotionally abused: Not on file    Physically abused: Not on file    Forced sexual activity: Not on file  Other Topics Concern  . Not on file  Social History Narrative  . Not on file    Family History  Problem Relation Age of Onset   . Cancer Mother   . Hypertension Mother   . COPD Mother   . Asthma Sister   . Psoriasis Sister   . Epilepsy Brother      Current Outpatient Medications:  .  acarbose (PRECOSE) 100 MG tablet, Take 100 mg by mouth 3 (three) times daily. With meals, Disp: , Rfl:  .  alendronate (FOSAMAX) 70 MG tablet, Take 70 mg by mouth once a week., Disp: , Rfl:  .  ARIPiprazole ER (ABILIFY MAINTENA) 400 MG PRSY prefilled syringe, Inject 400 mg into the muscle every 30 (thirty) days., Disp: , Rfl:  .  butalbital-acetaminophen-caffeine (FIORICET) 50-325-40 MG tablet, Take 1 tablet by mouth every 4 (four) hours as needed for pain., Disp: , Rfl:  .  Calcium Carb-Cholecalciferol (CALTRATE 600+D3) 600-800 MG-UNIT TABS,  Take 1 tablet by mouth 2 (two) times daily., Disp: , Rfl:  .  cetirizine (ZYRTEC) 10 MG tablet, Take 10 mg by mouth daily., Disp: , Rfl:  .  cyanocobalamin (,VITAMIN B-12,) 1000 MCG/ML injection, Inject 1,000 mcg into the muscle every 30 (thirty) days., Disp: , Rfl:  .  ergocalciferol (VITAMIN D2) 1.25 MG (50000 UT) capsule, Take 1.25 mcg by mouth once a week. , Disp: , Rfl:  .  FYAVOLV 0.5-2.5 MG-MCG tablet, Take 1 tablet by mouth daily., Disp: , Rfl:  .  gabapentin (NEURONTIN) 400 MG capsule, Take 1 capsule (400 mg total) by mouth 4 (four) times daily., Disp: 120 capsule, Rfl: 2 .  Galcanezumab-gnlm 120 MG/ML SOAJ, Inject 120 mg into the skin every 30 (thirty) days., Disp: , Rfl:  .  isosorbide dinitrate (ISORDIL) 30 MG tablet, Take 30 mg by mouth daily., Disp: , Rfl:  .  lactulose (CHRONULAC) 10 GM/15ML solution, Take 15 mLs by mouth as needed., Disp: , Rfl:  .  levothyroxine (LEVOXYL) 88 MCG tablet, Take 88 mcg by mouth daily., Disp: , Rfl:  .  linaclotide (LINZESS) 145 MCG CAPS capsule, Take 145 mcg by mouth daily before breakfast., Disp: , Rfl:  .  nitroGLYCERIN (NITROSTAT) 0.4 MG SL tablet, Place 0.4 mg under the tongue as needed., Disp: , Rfl:  .  ondansetron (ZOFRAN) 8 MG tablet, Take 8  mg by mouth as needed., Disp: , Rfl:  .  OXcarbazepine (TRILEPTAL) 300 MG/5ML suspension, Take 450 mg by mouth 2 (two) times daily., Disp: , Rfl:  .  pantoprazole (PROTONIX) 40 MG tablet, Take 40 mg by mouth daily., Disp: , Rfl:  .  polyethylene glycol (MIRALAX / GLYCOLAX) 17 g packet, Take 17 g by mouth 2 (two) times daily. (Patient taking differently: Take 17 g by mouth daily as needed. ), Disp: 14 each, Rfl: 0 .  QUEtiapine (SEROQUEL) 50 MG tablet, Take 50 mg by mouth at bedtime., Disp: , Rfl:  .  ranolazine (RANEXA) 500 MG 12 hr tablet, Take 500 mg by mouth 2 (two) times daily., Disp: , Rfl:  .  rosuvastatin (CRESTOR) 5 MG tablet, Take 10 mg by mouth daily. , Disp: , Rfl:  .  SUMAtriptan (IMITREX) 100 MG tablet, Take 100 mg by mouth as needed., Disp: , Rfl:  .  tiZANidine (ZANAFLEX) 4 MG tablet, Take 1 tablet (4 mg total) by mouth 3 (three) times daily., Disp: 90 tablet, Rfl: 2 .  traMADol (ULTRAM) 50 MG tablet, Take 1 tablet (50 mg total) by mouth every 6 (six) hours as needed for severe pain., Disp: 120 tablet, Rfl: 0 .  traZODone (DESYREL) 100 MG tablet, Take 100 mg by mouth at bedtime., Disp: , Rfl:  .  zolpidem (AMBIEN) 5 MG tablet, Take 5 mg by mouth at bedtime., Disp: , Rfl:   No results found.  No images are attached to the encounter.   CMP Latest Ref Rng & Units 08/05/2018  Glucose 70 - 99 mg/dL 86  BUN 6 - 20 mg/dL 7  Creatinine 0.44 - 1.00 mg/dL 0.79  Sodium 135 - 145 mmol/L 135  Potassium 3.5 - 5.1 mmol/L 3.6  Chloride 98 - 111 mmol/L 103  CO2 22 - 32 mmol/L 24  Calcium 8.9 - 10.3 mg/dL 8.3(L)  Total Protein 6.5 - 8.1 g/dL 6.9  Total Bilirubin 0.3 - 1.2 mg/dL 0.4  Alkaline Phos 38 - 126 U/L 141(H)  AST 15 - 41 U/L 22  ALT 0 - 44 U/L 16  CBC Latest Ref Rng & Units 09/20/2018  WBC 4.0 - 10.5 K/uL 12.5(H)  Hemoglobin 12.0 - 15.0 g/dL 11.1(L)  Hematocrit 36.0 - 46.0 % 34.2(L)  Platelets 150 - 400 K/uL 318     Observation/objective: Appears in no acute distress of a  video visit today.  Breathing is nonlabored  Assessment and plan: Patient is a 53 year old female with history of gastric bypass with iron deficiency anemia  Patient's hemoglobin has improved from 9.4-11.1 today.  She is received 2 doses of Feraheme and iron studies currently are normal.  Patient is also taking B12 shots every month.  Her thrombocytosis was likely secondary to iron deficiency which has resolved and so has a microcytosis.  Follow-up instructions: CBC ferritin and iron studies in 3 in 6 months and I will see her back in 6 months  I discussed the assessment and treatment plan with the patient. The patient was provided an opportunity to ask questions and all were answered. The patient agreed with the plan and demonstrated an understanding of the instructions.   The patient was advised to call back or seek an in-person evaluation if the symptoms worsen or if the condition fails to improve as anticipated.   Visit Diagnosis: 1. Iron deficiency anemia, unspecified iron deficiency anemia type     Dr. Owens SharkArchana Tyjai Matuszak, MD, MPH [facility] [phone] 09/21/2018 2:46 PM

## 2018-09-21 NOTE — Progress Notes (Signed)
Pt to have video visit to go over lab results and plan going forward. She has had gastric bypass in the past

## 2018-09-24 ENCOUNTER — Emergency Department
Admission: EM | Admit: 2018-09-24 | Discharge: 2018-09-24 | Disposition: A | Discharge status: Home / Self Care | Payer: BC Managed Care – PPO | Attending: Emergency Medicine | Admitting: Emergency Medicine

## 2018-09-24 ENCOUNTER — Other Ambulatory Visit: Payer: Self-pay

## 2018-09-24 ENCOUNTER — Telehealth: Payer: Self-pay | Admitting: *Deleted

## 2018-09-24 ENCOUNTER — Emergency Department: Payer: BC Managed Care – PPO

## 2018-09-24 ENCOUNTER — Encounter: Payer: Self-pay | Admitting: Emergency Medicine

## 2018-09-24 DIAGNOSIS — R1013 Epigastric pain: Secondary | ICD-10-CM | POA: Diagnosis present

## 2018-09-24 DIAGNOSIS — R110 Nausea: Secondary | ICD-10-CM

## 2018-09-24 DIAGNOSIS — E039 Hypothyroidism, unspecified: Secondary | ICD-10-CM | POA: Insufficient documentation

## 2018-09-24 DIAGNOSIS — Z79899 Other long term (current) drug therapy: Secondary | ICD-10-CM | POA: Diagnosis not present

## 2018-09-24 DIAGNOSIS — D582 Other hemoglobinopathies: Secondary | ICD-10-CM

## 2018-09-24 DIAGNOSIS — D509 Iron deficiency anemia, unspecified: Secondary | ICD-10-CM

## 2018-09-24 DIAGNOSIS — I252 Old myocardial infarction: Secondary | ICD-10-CM | POA: Insufficient documentation

## 2018-09-24 LAB — COMPREHENSIVE METABOLIC PANEL
ALT: 28 U/L (ref 0–44)
AST: 27 U/L (ref 15–41)
Albumin: 4.2 g/dL (ref 3.5–5.0)
Alkaline Phosphatase: 121 U/L (ref 38–126)
Anion gap: 13 (ref 5–15)
BUN: 8 mg/dL (ref 6–20)
CO2: 23 mmol/L (ref 22–32)
Calcium: 9.1 mg/dL (ref 8.9–10.3)
Chloride: 104 mmol/L (ref 98–111)
Creatinine, Ser: 0.7 mg/dL (ref 0.44–1.00)
GFR calc Af Amer: >60 mL/min (ref >60)
GFR calc non Af Amer: >60 mL/min (ref >60)
Glucose, Bld: 125 mg/dL — ABNORMAL HIGH (ref 70–99)
Potassium: 3.5 mmol/L (ref 3.5–5.1)
Sodium: 140 mmol/L (ref 135–145)
Total Bilirubin: 0.4 mg/dL (ref 0.3–1.2)
Total Protein: 7.3 g/dL (ref 6.5–8.1)

## 2018-09-24 LAB — CBC WITH DIFFERENTIAL/PLATELET
Abs Immature Granulocytes: 0.02 10*3/uL (ref 0.00–0.07)
Basophils Absolute: 0 10*3/uL (ref 0.0–0.1)
Basophils Relative: 0 %
Eosinophils Absolute: 0 10*3/uL (ref 0.0–0.5)
Eosinophils Relative: 0 %
HCT: 63.1 % — ABNORMAL HIGH (ref 36.0–46.0)
Hemoglobin: 20.4 g/dL — ABNORMAL HIGH (ref 12.0–15.0)
Immature Granulocytes: 0 %
Lymphocytes Relative: 9 %
Lymphs Abs: 0.5 10*3/uL — ABNORMAL LOW (ref 0.7–4.0)
MCH: 29.7 pg (ref 26.0–34.0)
MCHC: 32.3 g/dL (ref 30.0–36.0)
MCV: 91.8 fL (ref 80.0–100.0)
Monocytes Absolute: 0.3 10*3/uL (ref 0.1–1.0)
Monocytes Relative: 5 %
Neutro Abs: 4.1 10*3/uL (ref 1.7–7.7)
Neutrophils Relative %: 86 %
Platelets: 128 10*3/uL — ABNORMAL LOW (ref 150–400)
RBC: 6.87 MIL/uL — ABNORMAL HIGH (ref 3.87–5.11)
RDW: 24 % — ABNORMAL HIGH (ref 11.5–15.5)
WBC: 4.9 10*3/uL (ref 4.0–10.5)
nRBC: 0 % (ref 0.0–0.2)

## 2018-09-24 LAB — URINALYSIS, COMPLETE (UACMP) WITH MICROSCOPIC
Bacteria, UA: NONE SEEN
Bilirubin Urine: NEGATIVE
Glucose, UA: NEGATIVE mg/dL
Hgb urine dipstick: NEGATIVE
Ketones, ur: 5 mg/dL — AB
Leukocytes,Ua: NEGATIVE
Nitrite: NEGATIVE
Protein, ur: NEGATIVE mg/dL
Specific Gravity, Urine: 1.016 (ref 1.005–1.030)
pH: 7 (ref 5.0–8.0)

## 2018-09-24 LAB — URINE DRUG SCREEN, QUALITATIVE (ARMC ONLY)
Amphetamines, Ur Screen: NOT DETECTED
Barbiturates, Ur Screen: POSITIVE — AB
Benzodiazepine, Ur Scrn: NOT DETECTED
Cannabinoid 50 Ng, Ur ~~LOC~~: NOT DETECTED
Cocaine Metabolite,Ur ~~LOC~~: NOT DETECTED
MDMA (Ecstasy)Ur Screen: NOT DETECTED
Methadone Scn, Ur: NOT DETECTED
Opiate, Ur Screen: NOT DETECTED
Phencyclidine (PCP) Ur S: NOT DETECTED
Tricyclic, Ur Screen: NOT DETECTED

## 2018-09-24 LAB — LIPASE, BLOOD: Lipase: 28 U/L (ref 11–51)

## 2018-09-24 MED ORDER — LIDOCAINE VISCOUS HCL 2 % MT SOLN
15.0000 mL | Freq: Once | OROMUCOSAL | Status: AC
  Administered 2018-09-24: 03:00:00 15 mL via ORAL
  Filled 2018-09-24: qty 15

## 2018-09-24 MED ORDER — ALUM & MAG HYDROXIDE-SIMETH 200-200-20 MG/5ML PO SUSP
30.0000 mL | Freq: Once | ORAL | Status: AC
  Administered 2018-09-24: 30 mL via ORAL
  Filled 2018-09-24: qty 30

## 2018-09-24 MED ORDER — IOHEXOL 300 MG/ML  SOLN
100.0000 mL | Freq: Once | INTRAMUSCULAR | Status: AC | PRN
  Administered 2018-09-24: 03:00:00 100 mL via INTRAVENOUS

## 2018-09-24 MED ORDER — HALOPERIDOL LACTATE 5 MG/ML IJ SOLN
2.5000 mg | Freq: Once | INTRAMUSCULAR | Status: AC
  Administered 2018-09-24: 03:00:00 2.5 mg via INTRAVENOUS
  Filled 2018-09-24: qty 1

## 2018-09-24 MED ORDER — DIPHENHYDRAMINE HCL 50 MG/ML IJ SOLN
12.5000 mg | INTRAMUSCULAR | Status: AC
  Administered 2018-09-24: 03:00:00 12.5 mg via INTRAVENOUS
  Filled 2018-09-24: qty 1

## 2018-09-24 MED ORDER — PANTOPRAZOLE SODIUM 40 MG IV SOLR
40.0000 mg | Freq: Once | INTRAVENOUS | Status: AC
  Administered 2018-09-24: 03:00:00 40 mg via INTRAVENOUS
  Filled 2018-09-24: qty 40

## 2018-09-24 NOTE — ED Notes (Signed)
Attempted blood draw x 1 by this nurse and x 2 by EDT without success; pt reports that she is a difficult stick; consult entered for IV team

## 2018-09-24 NOTE — ED Provider Notes (Signed)
[facility] Emergency Department Provider Note  ____________________________________________   First MD Initiated Contact with Patient 09/24/18 [phone]     (approximate)  I have reviewed the triage vital signs and the nursing notes.   HISTORY  Chief Complaint Abdominal Pain    HPI Diana Quinn is a 53 y.o. female with extensive past medical history as listed below and a recent end[facility] by [facility]. [facility]in [facility] (about a week ago) who presents for evaluation of acute onset and severe epigastric pain.  She reports that it is a burning pain is accompanied with nausea.  Is been going on for couple days but became very severe tonight.  Nothing particular makes it better or worse.  She has been taking Carafate and Protonix as prescribed by Dr. Shelle Ironein and says it does not seem to be helping.  She has extremely anxious and upset and in distress upon arrival.  She denies fever, sore throat, chest pain, shortness of breath, and denies any contact with known COVID-19 patients.         Past Medical History:  Diagnosis Date   Anemia    Gastric bypass status for obesity    H/O unstable angina    Hypoglycemia    MI (myocardial infarction) (HCC)    Orthostatic hypotension    Osteoporosis    Sciatic nerve disease    Seizures (HCC)    Thyroid disease     Patient Active Problem List   Diagnosis Date Noted   H/O gastric bypass 07/27/2018   Iron deficiency anemia 07/27/2018   Chronic abdominal pain 07/12/2018   Intractable abdominal pain 07/12/2018   Hypoglycemia after GI (gastrointestinal) surgery 06/29/2018   Spondylosis without myelopathy or radiculopathy, lumbosacral region 04/27/2018   Chronic low back pain (Bilateral) (L>R) w/o sciatica 04/27/2018   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   Chronic hip pain (Bilateral) 04/14/2018   Chronic sacroiliac joint pain (Bilateral) 04/14/2018   Lumbar facet syndrome (Bilateral) (L>R) 04/14/2018    Neurogenic pain 04/14/2018   Chronic musculoskeletal pain 04/14/2018   Chronic lumbar radiculitis (S1) (Bilateral) 04/14/2018   Osteoarthritis of knees (Bilateral) 04/13/2018   Tricompartment osteoarthritis of knee (Right) 04/13/2018   Chronic low back pain (Primary Area of Pain) (Bilateral) (L>R) w/ sciatica 03/31/2018   Chronic lower extremity pain (Secondary Area of Pain) (Bilateral) (L>R) 03/31/2018   Chronic neck pain (Tertiary Area of Pain) (Bilateral) (L>R) 03/31/2018   Occipital headache (Fourth Area of Pain) (Bilateral) (L>R) 03/31/2018   Pharmacologic therapy 03/31/2018   Disorder of skeletal system 03/31/2018   Problems influencing health status 03/31/2018   Chronic knee pain (Bilateral) (L>R) 03/31/2018   Chronic pain syndrome 03/31/2018   DDD (degenerative disc disease), lumbar 02/08/2018   Other and unspecified hyperlipidemia 02/08/2018   Lumbar spondylosis 02/08/2018   Vitamin D deficiency 02/08/2018   Angina pectoris (HCC) 01/26/2018   Hypotension 01/26/2018   Impaired glucose tolerance 01/26/2018   Other B-complex deficiencies 01/26/2018   Schizophrenia (HCC) 01/26/2018   Epilepsy (HCC) 07/10/2011   Bariatric surgery status 03/24/2011   Bipolar disorder (HCC) 03/24/2011   Generalized anxiety disorder 03/24/2011   Hypothyroidism 03/24/2011   Migraine headache 03/24/2011   Sleep disturbances 03/24/2011    Past Surgical History:  Procedure Laterality Date   ABDOMINAL ADHESION SURGERY     ABDOMINAL HYSTERECTOMY     CHOLECYSTECTOMY     DILATION AND CURETTAGE OF UTERUS     ESOPHAGOGASTRODUODENOSCOPY     GASTRIC BYPASS     GASTRIC BYPASS OPEN  revision   GASTROSTOMY W/ FEEDING TUBE     HERNIA REPAIR     OOPHORECTOMY     SPLENECTOMY     TONSILLECTOMY      Prior to Admission medications   Medication Sig Start Date End Date Taking? Authorizing Provider  acarbose (PRECOSE) 100 MG tablet Take 100 mg by mouth 3 (three)  times daily. With meals 06/29/18   [provider]  alendronate (FOSAMAX) 70 MG tablet Take 70 mg by mouth once a week. 02/19/18   [provider]  ARIPiprazole ER (ABILIFY MAINTENA) 400 MG PRSY prefilled syringe Inject 400 mg into the muscle every 30 (thirty) days. 01/26/18   [provider]  butalbital-acetaminophen-caffeine (FIORICET) 50-325-40 MG tablet Take 1 tablet by mouth every 4 (four) hours as needed for pain.    [provider]  Calcium Carb-Cholecalciferol (CALTRATE 600+D3) 600-800 MG-UNIT TABS Take 1 tablet by mouth 2 (two) times daily. 01/26/18   [provider]  cetirizine (ZYRTEC) 10 MG tablet Take 10 mg by mouth daily. 04/29/18   [provider]  cyanocobalamin (,VITAMIN B-12,) 1000 MCG/ML injection Inject 1,000 mcg into the muscle every 30 (thirty) days. 02/25/18   [provider]  ergocalciferol (VITAMIN D2) 1.25 MG (50000 UT) capsule Take 1.25 mcg by mouth once a week.  02/08/18   [provider]  FYAVOLV 0.5-2.5 MG-MCG tablet Take 1 tablet by mouth daily. 02/19/18   [provider]  gabapentin (NEURONTIN) 400 MG capsule Take 1 capsule (400 mg total) by mouth 4 (four) times daily. 08/29/18 11/27/18  Delano MetzNaveira, Francisco, MD  Galcanezumab-gnlm 120 MG/ML SOAJ Inject 120 mg into the skin every 30 (thirty) days. 06/28/18   [provider]  isosorbide dinitrate (ISORDIL) 30 MG tablet Take 30 mg by mouth daily.    [provider]  lactulose (CHRONULAC) 10 GM/15ML solution Take 15 mLs by mouth as needed. 01/26/18   [provider]  levothyroxine (LEVOXYL) 88 MCG tablet Take 88 mcg by mouth daily. 01/26/18   [provider]  linaclotide (LINZESS) 145 MCG CAPS capsule Take 145 mcg by mouth daily before breakfast.    [provider]  nitroGLYCERIN (NITROSTAT) 0.4 MG SL tablet Place 0.4 mg under the tongue as needed. 01/26/18   [provider]  ondansetron (ZOFRAN) 8  MG tablet Take 8 mg by mouth as needed. 01/26/18   [provider]  OXcarbazepine (TRILEPTAL) 300 MG/5ML suspension Take 450 mg by mouth 2 (two) times daily.    [provider]  pantoprazole (PROTONIX) 40 MG tablet Take 40 mg by mouth daily.    [provider]  polyethylene glycol (MIRALAX / GLYCOLAX) 17 g packet Take 17 g by mouth 2 (two) times daily. Patient taking differently: Take 17 g by mouth daily as needed.  07/16/18   Altamese DillingVachhani, Vaibhavkumar, MD  QUEtiapine (SEROQUEL) 50 MG tablet Take 50 mg by mouth at bedtime. 03/15/18   [provider]  ranolazine (RANEXA) 500 MG 12 hr tablet Take 500 mg by mouth 2 (two) times daily.    [provider]  rosuvastatin (CRESTOR) 5 MG tablet Take 10 mg by mouth daily.  02/08/18   [provider]  SUMAtriptan (IMITREX) 100 MG tablet Take 100 mg by mouth as needed. 01/26/18   [provider]  tiZANidine (ZANAFLEX) 4 MG tablet Take 1 tablet (4 mg total) by mouth 3 (three) times daily. 08/29/18 11/27/18  Delano MetzNaveira, Francisco, MD  traMADol (ULTRAM) 50 MG tablet Take 1 tablet (50  mg total) by mouth every 6 (six) hours as needed for severe pain. 08/29/18 11/27/18  Delano MetzNaveira, Francisco, MD  traZODone (DESYREL) 100 MG tablet Take 100 mg by mouth at bedtime.    [provider]  zolpidem (AMBIEN) 5 MG tablet Take 5 mg by mouth at bedtime. 07/09/18 09/21/18  [provider]    Allergies Depakote [divalproex sodium], Pregabalin, Valproic acid, and Levetiracetam  Family History  Problem Relation Age of Onset   Cancer Mother    Hypertension Mother    COPD Mother    Asthma Sister    Psoriasis Sister    Epilepsy Brother     Social History Social History   Tobacco Use   Smoking status: Never Smoker   Smokeless tobacco: Never Used  Substance Use Topics   Alcohol use: Not Currently   Drug use: Never    Review of Systems Constitutional: No fever/chills Eyes: No visual changes. ENT:  No sore throat. Cardiovascular: Denies chest pain. Respiratory: Denies shortness of breath. Gastrointestinal: Epigastric abdominal pain.  Nausea, no vomiting.  No diarrhea.  No constipation. Genitourinary: Negative for dysuria. Musculoskeletal: Negative for neck pain.  Negative for back pain. Integumentary: Negative for rash. Neurological: Negative for headaches, focal weakness or numbness.   ____________________________________________   PHYSICAL EXAM:  VITAL SIGNS: ED Triage Vitals  Enc Vitals Group     BP 09/24/18 0033 140/63     Pulse Rate 09/24/18 0033 63     Resp 09/24/18 0033 18     Temp 09/24/18 0033 98.5 F (36.9 C)     Temp Source 09/24/18 0033 Oral     SpO2 09/24/18 0033 100 %     Weight --      Height --      Head Circumference --      Peak Flow --      Pain Score 09/24/18 0031 10     Pain Loc --      Pain Edu? --      Excl. in GC? --     Constitutional: Alert and oriented.  Very anxious and uncomfortable and in distress. Eyes: Conjunctivae are normal.  Head: Atraumatic. Nose: No congestion/rhinnorhea. Mouth/Throat: Mucous membranes are moist. Neck: No stridor.  No meningeal signs.   Cardiovascular: Normal rate, regular rhythm. Good peripheral circulation. Grossly normal heart sounds. Respiratory: Normal respiratory effort.  No retractions. No audible wheezing. Gastrointestinal: Soft and nondistended.  only minimal tenderness to the epigastrium and no rebound or guarding. Musculoskeletal: No lower extremity tenderness nor edema. No gross deformities of extremities. Neurologic:  Normal speech and language. No gross focal neurologic deficits are appreciated.  Skin:  Skin is warm, dry and intact. No rash noted. Psychiatric: Mood and affect are very anxious and upset, crying without producing tears, seems very nervous.  ____________________________________________   LABS (all labs ordered are listed, but only abnormal results are displayed)  Labs Reviewed   CBC WITH DIFFERENTIAL/PLATELET - Abnormal; Notable for the following components:      Result Value   RBC 6.87 (*)    Hemoglobin 20.4 (*)    HCT 63.1 (*)    RDW 24.0 (*)    Platelets 128 (*)    Lymphs Abs 0.5 (*)    All other components within normal limits  COMPREHENSIVE METABOLIC PANEL - Abnormal; Notable for the following components:   Glucose, Bld 125 (*)    All other components within normal limits  URINALYSIS, COMPLETE (UACMP) WITH MICROSCOPIC - Abnormal; Notable for the following components:  Color, Urine YELLOW (*)    APPearance HAZY (*)    Ketones, ur 5 (*)    All other components within normal limits  URINE DRUG SCREEN, QUALITATIVE ([facility] ONLY) - Abnormal; Notable for the following components:   Barbiturates, Ur Screen POSITIVE (*)    All other components within normal limits  LIPASE, BLOOD   ____________________________________________  EKG  ED ECG REPORT I, Hinda Kehr, the attending physician, personally viewed and interpreted this ECG.  Date: 09/24/2018 EKG Time: 00: 52 Rate: 59 Rhythm: normal sinus rhythm, borderline bradycardia QRS Axis: normal Intervals: normal ST/T Wave abnormalities: normal Narrative Interpretation: no evidence of acute ischemia  ____________________________________________  RADIOLOGY I, Hinda Kehr, personally discussed these images and results by phone with the on-call radiologist and used this discussion as part of my medical decision making.    ED MD interpretation: Some hazy duodenitis but no evidence of emergent medical condition.  Official radiology report(s): Ct Abdomen Pelvis W Contrast  Result Date: 09/24/2018 CLINICAL DATA:  History of gastric ulcer, epigastric pain EXAM: CT ABDOMEN AND PELVIS WITH CONTRAST TECHNIQUE: Multidetector CT imaging of the abdomen and pelvis was performed using the standard protocol following bolus administration of intravenous contrast. CONTRAST:  168mL OMNIPAQUE IOHEXOL 300 MG/ML  SOLN  COMPARISON:  CT 07/11/2018 FINDINGS: Lower chest: Lung bases demonstrate no acute consolidation or effusion. Mild cardiomegaly. Small hiatal hernia. Hepatobiliary: No focal liver abnormality is seen. Status post cholecystectomy. No biliary dilatation. Pancreas: Slight indistinct/edematous appearance at the head of pancreas and duodenal bulb. No ductal dilatation Spleen: Status post splenectomy. Splenules in the left upper quadrant. Adrenals/Urinary Tract: Adrenal glands are normal. Kidneys show no hydronephrosis. The bladder is normal. Stomach/Bowel: Status post gastric bypass. No dilated small bowel. No colon wall thickening. Prominent appendix measuring up to 8 mm in size but no significant surrounding inflammation. Vascular/Lymphatic: Nonaneurysmal aorta. No significantly enlarged lymph nodes Reproductive: Status post hysterectomy. No adnexal masses. Other: Negative for free air or free fluid Musculoskeletal: No acute or suspicious osseous abnormality IMPRESSION: 1. Slightly indistinct appearance at the pancreaticoduodenal groove, possibly representing mild pancreatitis or duodenitis. Suggest correlation with enzymes. 2. Status post gastric bypass surgery.  No bowel obstruction 3. Prominent appearing appendix up to 8 mm but no surrounding inflammation Electronically Signed   By: Donavan Foil M.D.   On: 09/24/2018 03:47    ____________________________________________   PROCEDURES   Procedure(s) performed (including Critical Care):  Procedures   ____________________________________________   INITIAL IMPRESSION / MDM / [place] / ED COURSE  As part of my medical decision making, I reviewed the following data within the [place] notes reviewed and incorporated, Labs reviewed , EKG interpreted , Old chart reviewed, Discussed with radiologist, Notes from prior ED visits and [place] Controlled Substance Database   The patient is in a great deal of distress also has  extensive history of GI issues including a recent endoscopy and chronic pain syndrome under the care of Dr. Dossie Arbour as well as some psychiatric history.  She specifically asked for a GI cocktail so I ordered a cocktail of Maalox and viscous lidocaine.  For her nausea I have also ordered haloperidol 2.5 mg IV and Benadryl 12.5 mg IV.  Lab work has been ordered and I will plan to check a CT scan of the abdomen and pelvis with IV contrast only given her history of gastric bypass surgery, and the severe pain with which she is presenting, and a recent endoscopy as I would  like to look for any signs of procedural complication.  She understands and agrees with the plan.      Clinical Course as of Sep 23 505  Fri Sep 24, 2018  0253 Reassuring CMP and lipase   [CF]  [id number] The patient CT scan was reassuring.  I discussed it with the radiologist and he did not think that there was anything emergent, and feels that the questionable duodenitis may be reflective of the ulcer area that was seen on the recent endoscopy.I reassessed the patient and she is still very anxious but feels much better and says she very much wants to go home and get some sleep.  The medications I gave her earlier seems to help a lot.I told her that her lab work is generally reassuring with no evidence of dehydration but that her hemoglobin was notable for being 20.4, which is a dramatic change from the last time it was checked, even in spite of her recent iron transfusion by Dr. Smith Robert.  I offered to give her a liter of fluids but she just wants to go home at this point I think that is appropriate.  I sent a message to Dr. Smith Robert through [facility] so she would be aware in case she wants order additional outpatient labs.  Patient is comfortable taking the medication she already has at home and will follow up with her GI doctor as well.  I gave my usual customary return precautions.   [CF]    Clinical Course User Index [CF] Loleta Rose, MD      ____________________________________________  FINAL CLINICAL IMPRESSION(S) / ED DIAGNOSES  Final diagnoses:  Epigastric pain  Nausea  Elevated hemoglobin (HCC)     MEDICATIONS GIVEN DURING THIS VISIT:  Medications  alum & mag hydroxide-simeth (MAALOX/MYLANTA) 200-200-20 MG/5ML suspension 30 mL (30 mLs Oral Given 09/24/18 0237)    And  lidocaine (XYLOCAINE) 2 % viscous mouth solution 15 mL (15 mLs Oral Given 09/24/18 0237)  pantoprazole (PROTONIX) injection 40 mg (40 mg Intravenous Given 09/24/18 0240)  haloperidol lactate (HALDOL) injection 2.5 mg (2.5 mg Intravenous Given 09/24/18 0238)  diphenhydrAMINE (BENADRYL) injection 12.5 mg (12.5 mg Intravenous Given 09/24/18 0240)  iohexol (OMNIPAQUE) 300 MG/ML solution 100 mL (100 mLs Intravenous Contrast Given 09/24/18 0318)     ED Discharge Orders    None      *Please note:  Diana Quinn was evaluated in Emergency Department on 09/24/2018 for the symptoms described in the history of present illness. She was evaluated in the context of the global COVID-19 pandemic, which necessitated consideration that the patient might be at risk for infection with the SARS-CoV-2 virus that causes COVID-19. Institutional protocols and algorithms that pertain to the evaluation of patients at risk for COVID-19 are in a state of rapid change based on information released by regulatory bodies including the CDC and federal and state organizations. These policies and algorithms were followed during the patient's care in the ED.  Some ED evaluations and interventions may be delayed as a result of limited staffing during the pandemic.*  Note:  This document was prepared using Dragon voice recognition software and may include unintentional dictation errors.   Loleta Rose, MD 09/24/18 [phone]

## 2018-09-24 NOTE — Discharge Instructions (Signed)
As we discussed, your work-up was reassuring today.  Your hemoglobin was surprisingly elevated even in the setting of recent treatment for iron deficiency anemia, and I sent a message to Dr. Janese Banks to let you know that you may be calling and/or to schedule some outpatient labs and follow-up.  Please continue using the medications prescribed by Dr. Rayann Heman and call him to schedule follow-up appointment as well.  Be sure to drink plenty of clear fluids and stay hydrated.  Return to the emergency department if you develop new or worsening symptoms that concern you.

## 2018-09-24 NOTE — ED Notes (Signed)
Report to lisa rn

## 2018-09-24 NOTE — ED Triage Notes (Signed)
Pt to triage via w/c, mask in place; Brought in by EMS; dx with gastric ulcer on Friday after endoscopy--rx carafate in conjunction with prilosec, hx gastric bypass; since yesterday having epigastric pain accomp by N/V radiating into back

## 2018-09-24 NOTE — Telephone Encounter (Signed)
Called patient and left a message on her voicemail that we have recently checked her hemoglobin and it was 11 and 4 days later she had went to the ER and the hemoglobin was 20.  We usually do not see hemoglobin jumps like that and the ER doctor had sent a message to Dr. Janese Banks letting her know about this anomaly and Dr. Janese Banks would like her to come in 1 day next week and have a CBC drawn so we can check it again to make sure what it is.  I have left my name and direct number if patient could call me back if she is agreeable to coming next week and have her CBC checked

## 2018-09-24 NOTE — Telephone Encounter (Signed)
-----   Message from Sindy Guadeloupe, MD sent at 09/24/2018 11:04 AM EDT ----- Regarding: FW: outpatient follow up Please see below.  It is bizarre that her hemoglobin would be 20 when it was 11 just 4 days ago.  It is likely a lab error.  However I would like her to get a repeat CBC checked next week.  If you could please arrange that ----- Message ----- From: Hinda Kehr, MD Sent: 09/24/2018   4:56 AM EDT To: Sindy Guadeloupe, MD Subject: outpatient follow up                           Hi Dr. Janese Banks,  This is a patient you treated for iron deficient anemia.  Apparently she received an iron transfusion within the last couple of weeks.  She had lab work done on the 13th which showed an improved hemoglobin of about 11.  She came to the ED tonight with severe epigastric abdominal pain.  In short, her work-up was reassuring and I am discharging her and she is eager to go home to get some sleep.  However, her lab work is notable for a hemoglobin of 20 with a platelet count of about 120,000.  This was such a profound change from only 4 days ago that I wonder if there was some sort of a lab mixup or error.  However, given her otherwise reassuring work-up and the fact that she feels much better and wants to go home, I will not repeat any lab work at this time.  I thought I should let you know, however, so that if you wish to order some outpatient labs and schedule a follow-up appointment, you will have this information and can decide to see her sooner rather than later if you feel it is appropriate.  I told her about the surprising hemoglobin results as well as she may reach out to your office to schedule an appointment.  Thank you for your assistance.  Sincerely,  Hinda Kehr, [facility]

## 2018-09-29 ENCOUNTER — Telehealth: Payer: Self-pay | Admitting: *Deleted

## 2018-09-29 NOTE — Telephone Encounter (Signed)
I called the pt. And got voicemail and left message

## 2018-10-02 DIAGNOSIS — R569 Unspecified convulsions: Secondary | ICD-10-CM | POA: Insufficient documentation

## 2018-10-04 ENCOUNTER — Ambulatory Visit: Payer: BC Managed Care – PPO | Admitting: Pain Medicine

## 2018-10-06 NOTE — Telephone Encounter (Signed)
With the patient that md wants to recheck cbc to make sure the numbers are right since it was huge jump from our clinic and ER. Left my number to reach me at.

## 2018-10-07 ENCOUNTER — Encounter: Payer: Self-pay | Admitting: Pain Medicine

## 2018-10-09 NOTE — Progress Notes (Signed)
Pain Management Virtual Encounter Note - Virtual Visit via Telephone Telehealth (real-time audio visits between healthcare provider and patient).   Patient's Phone No. & Preferred Pharmacy:  [phone] (home); [phone] (mobile); (Preferred) [phone] [email or web address]  [facility][id number] Nicholes Rough- [place], [place] - [address] AT [facility] [address] [place] [id number] Phone: [phone] Fax: [phone]    Pre-screening note:  Our staff contacted Diana Quinn and offered her an "in person", "face-to-face" appointment versus a telephone encounter. She indicated preferring the telephone encounter, at this time.   Reason for Virtual Visit: COVID-19*  Social distancing based on CDC and AMA recommendations.   I contacted Diana Dayicole Asmar on 10/11/2018 via telephone.      I clearly identified myself as Oswaldo DoneFrancisco A Yanky Vanderburg, MD. I verified that I was speaking with the correct person using two identifiers (Name: Diana Quinn, and date of birth: 12/24/1965).  Advanced Informed Consent I sought verbal advanced consent from Diana DayNicole Helinski for virtual visit interactions. I informed Diana Quinn of possible security and privacy concerns, risks, and limitations associated with providing "not-in-person" medical evaluation and management services. I also informed Diana Quinn of the availability of "in-person" appointments. Finally, I informed her that there would be a charge for the virtual visit and that she could be  personally, fully or partially, financially responsible for it. Diana Quinn expressed understanding and agreed to proceed.   Historic Elements   [name] is a 53 y.o. year old, female patient evaluated today after her last encounter by our practice on 09/03/2018. Diana Quinn  has a past medical history of Anemia, Gastric bypass status for obesity, H/O unstable angina, Hypoglycemia, MI (myocardial infarction) (HCC), Orthostatic  hypotension, Osteoporosis, Sciatic nerve disease, Seizures (HCC), and Thyroid disease. She also  has a past surgical history that includes Gastric bypass; Abdominal hysterectomy; Cholecystectomy; Tonsillectomy; Gastric bypass open; Oophorectomy; Hernia repair; Abdominal adhesion surgery; Gastrostomy w/ feeding tube; Dilation and curettage of uterus; Splenectomy; and Esophagogastroduodenoscopy. Diana Quinn has a current medication list which includes the following prescription(s): acarbose, alendronate, aripiprazole er, butalbital-acetaminophen-caffeine, calcium carb-cholecalciferol, cetirizine, cyanocobalamin, ergocalciferol, fyavolv, gabapentin, galcanezumab-gnlm, isosorbide dinitrate, lactulose, levothyroxine, linaclotide, nitroglycerin, ondansetron, oxcarbazepine, pantoprazole, polyethylene glycol, quetiapine, ranolazine, rosuvastatin, sumatriptan, tizanidine, trazodone, hydrocodone-acetaminophen, hydrocodone-acetaminophen, hydrocodone-acetaminophen, sucralfate, and zolpidem. She  reports that she has never smoked. She has never used smokeless tobacco. She reports previous alcohol use. She reports that she does not use drugs. Diana Quinn is allergic to depakote [divalproex sodium]; pregabalin; and levetiracetam.   HPI  Today, she is being contacted for both, medication management and a post-procedure assessment.  According to the patient, the diagnostic lumbar facet block did provide her with good relief of the pain in the lower back and it also helped some of the lower extremity pain however, she has been experiencing dermatomal pain in the left lower extremity which did not improve significantly with the facet blocks suggesting that it may have a different etiology.  According to the patient she had a lumbar MRI done at the end of 2019 at [facility].  Unfortunately, we do not have those results available.  Today we will be asking for them to fax us a copy of it.  In terms of the patient's medication, today  I will be discontinuing the tramadol as she has a history of epilepsy and she has been experiencing some episodes of epilepsy as we attempted to increase her tramadol from 1 twice daily to 1 3 times daily.  Because tramadol decreases the seizure threshold, we will discontinue this medication and start the patient on hydrocodone/APAP 5/325 1 tablet p.o. 3 times daily.  Post-Procedure Evaluation  Procedure: Diagnostic bilateral lumbar facet block #2under fluoroscopic guidance and IV sedation Pre-procedure pain level:  7/10 Post-procedure: 0/10 (100% relief)  Sedation: Sedation provided.  Effectiveness during initial hour after procedure(Ultra-Short Term Relief): 100 %   Local anesthetic used: Long-acting (4-6 hours) Effectiveness: Defined as any analgesic benefit obtained secondary to the administration of local anesthetics. This carries significant diagnostic value as to the etiological location, or anatomical origin, of the pain. Duration of benefit is expected to coincide with the duration of the local anesthetic used.  Effectiveness during initial 4-6 hours after procedure(Short-Term Relief): 100 %   Long-term benefit: Defined as any relief past the pharmacologic duration of the local anesthetics.  Effectiveness past the initial 6 hours after procedure(Long-Term Relief): 75 % x 4 for the back. The sciatica took 6-7 days to come back.  Current benefits: Defined as benefit that persist at this time.   Analgesia:  Back to baseline Function: Somewhat improved ROM: Somewhat improved  Pharmacotherapy Assessment  Analgesic: Hydrocodone/APAP 5/325, 1 tab PO q 8 hrs (15 mg/day of hydrocodone) MME/day:15 mg/day.   Monitoring: Pharmacotherapy: No side-effects or adverse reactions reported. [place] PMP: PDMP reviewed during this encounter.       Compliance: No problems identified. Effectiveness: Clinically acceptable. Plan: Refer to "POC".  Pertinent Labs   SAFETY SCREENING Profile Lab Results   Component Value Date   SARSCOV2NAA NOT DETECTED 08/30/2018   COVIDSOURCE NASOPHARYNGEAL 08/30/2018   Renal Function Lab Results  Component Value Date   BUN 8 09/24/2018   CREATININE 0.70 09/24/2018   BCR 9 03/31/2018   GFRAA >60 09/24/2018   GFRNONAA >60 09/24/2018   Hepatic Function Lab Results  Component Value Date   AST 27 09/24/2018   ALT 28 09/24/2018   ALBUMIN 4.2 09/24/2018   UDS Summary  Date Value Ref Range Status  03/31/2018 FINAL  Final    Comment:    ==================================================================== TOXASSURE COMP DRUG ANALYSIS,UR ==================================================================== Test                             Result       Flag       Units Drug Present and Declared for Prescription Verification   7-aminoclonazepam              84           EXPECTED   ng/mg creat    7-aminoclonazepam is an expected metabolite of clonazepam. Source    of clonazepam is a scheduled prescription medication.   Butalbital                     PRESENT      EXPECTED   Gabapentin                     PRESENT      EXPECTED   Oxcarbazepine MHD              PRESENT      EXPECTED    Oxcarbazepine MHD is the active metabolite of oxcarbazepine and    eslicarbazepine.   Quetiapine                     PRESENT      EXPECTED   Aripiprazole  PRESENT      EXPECTED   Acetaminophen                  PRESENT      EXPECTED Drug Present not Declared for Prescription Verification   Baclofen                       PRESENT      UNEXPECTED   Ibuprofen                      PRESENT      UNEXPECTED Drug Absent but Declared for Prescription Verification   Oxycodone                      Not Detected UNEXPECTED ng/mg creat   Tizanidine                     Not Detected UNEXPECTED    Tizanidine, as indicated in the declared medication list, is not    always detected even when used as directed.   Salicylate                     Not Detected UNEXPECTED     Aspirin, as indicated in the declared medication list, is not    always detected even when used as directed. ==================================================================== Test                      Result    Flag   Units      Ref Range   Creatinine              102              mg/dL      >=16 ==================================================================== Declared Medications:  The flagging and interpretation on this report are based on the  following declared medications.  Unexpected results may arise from  inaccuracies in the declared medications.  **Note: The testing scope of this panel includes these medications:  Aripiprazole (Abilify)  Butalbital (Butalbital/APAP/Caffeine)  Clonazepam (Klonopin)  Gabapentin (Neurontin)  Oxcarbazepine (Trileptal)  Oxycodone (Percocet)  Quetiapine (Seroquel)  **Note: The testing scope of this panel does not include small to  moderate amounts of these reported medications:  Acetaminophen (Butalbital/APAP/Caffeine)  Acetaminophen (Percocet)  Aspirin  Tizanidine (Zanaflex)  **Note: The testing scope of this panel does not include following  reported medications:  Alendronate (Fosamax)  Caffeine (Butalbital/APAP/Caffeine)  Calcium (Calcium/Cholecalciferol)  Cholecalciferol (Calcium/Cholecalciferol)  Lactulose (Constulose)  Levothyroxine  Nitroglycerin (Nitrostat)  Ondansetron (Zofran)  Prednisone (Sterapred)  Rosuvastatin (Crestor)  Sumatriptan (Imitrex)  Vitamin B12  Vitamin D2 ==================================================================== For clinical consultation, please call [phone]. ====================================================================    Note: Above Lab results reviewed.  Recent imaging  CT ABDOMEN PELVIS W CONTRAST CLINICAL DATA:  History of gastric ulcer, epigastric pain  EXAM: CT ABDOMEN AND PELVIS WITH CONTRAST  TECHNIQUE: Multidetector CT imaging of the abdomen and pelvis was  performed using the standard protocol following bolus administration of intravenous contrast.  CONTRAST:  OMNIPAQUE IOHEXOL 300 MG/ML  SOLN  COMPARISON:  CT 07/11/2018  FINDINGS: Lower chest: Lung bases demonstrate no acute consolidation or effusion. Mild cardiomegaly. Small hiatal hernia.  Hepatobiliary: No focal liver abnormality is seen. Status post cholecystectomy. No biliary dilatation.  Pancreas: Slight indistinct/edematous appearance at the head of pancreas and duodenal bulb. No ductal dilatation  Spleen: Status post splenectomy. Splenules in the left upper quadrant.  Adrenals/Urinary Tract: Adrenal glands are normal. Kidneys show no hydronephrosis. The bladder is normal.  Stomach/Bowel: Status post gastric bypass. No dilated small bowel. No colon wall thickening. Prominent appendix measuring up to 8 mm in size but no significant surrounding inflammation.  Vascular/Lymphatic: Nonaneurysmal aorta. No significantly enlarged lymph nodes  Reproductive: Status post hysterectomy. No adnexal masses.  Other: Negative for free air or free fluid  Musculoskeletal: No acute or suspicious osseous abnormality  IMPRESSION: 1. Slightly indistinct appearance at the pancreaticoduodenal groove, possibly representing mild pancreatitis or duodenitis. Suggest correlation with enzymes. 2. Status post gastric bypass surgery.  No bowel obstruction 3. Prominent appearing appendix up to 8 mm but no surrounding inflammation  Electronically Signed   By: Donavan Foil M.D.   On: 09/24/2018 03:47  Assessment  The primary encounter diagnosis was Chronic pain syndrome. Diagnoses of Chronic low back pain (Primary Area of Pain) (Bilateral) (L>R) w/ sciatica, Chronic lower extremity pain (Secondary Area of Pain) (Bilateral) (L>R), Chronic neck pain (Tertiary Area of Pain) (Bilateral) (L>R), Chronic musculoskeletal pain, Neurogenic pain, and Chronic lumbar radiculitis (S1) (Bilateral) were  also pertinent to this visit.  Plan of Care  I have discontinued Elmyra Ricks Monestime's traMADol. I am also having her start on HYDROcodone-acetaminophen, HYDROcodone-acetaminophen, and HYDROcodone-acetaminophen. Additionally, I am having her maintain her alendronate, ARIPiprazole ER, Calcium Carb-Cholecalciferol, cyanocobalamin, ergocalciferol, lactulose, levothyroxine, nitroGLYCERIN, Fyavolv, ondansetron, QUEtiapine, rosuvastatin, SUMAtriptan, ranolazine, isosorbide dinitrate, cetirizine, acarbose, butalbital-acetaminophen-caffeine, Galcanezumab-gnlm, zolpidem, polyethylene glycol, linaclotide, pantoprazole, traZODone, OXcarbazepine, sucralfate, tiZANidine, and gabapentin.  Pharmacotherapy (Medications Ordered): Meds ordered this encounter  Medications  . tiZANidine (ZANAFLEX) 4 MG tablet    Sig: Take 1 tablet (4 mg total) by mouth 3 (three) times daily.    Dispense:  90 tablet    Refill:  2    Fill one day early if pharmacy is closed on scheduled refill date. May substitute for generic if available.  . gabapentin (NEURONTIN) 400 MG capsule    Sig: Take 1 capsule (400 mg total) by mouth 4 (four) times daily.    Dispense:  120 capsule    Refill:  2    Fill one day early if pharmacy is closed on scheduled refill date. May substitute for generic if available.  Marland Kitchen HYDROcodone-acetaminophen (NORCO/VICODIN) 5-325 MG tablet    Sig: Take 1 tablet by mouth every 8 (eight) hours as needed for severe pain. Must last 30 days    Dispense:  90 tablet    Refill:  0    Chronic Pain: STOP Act (Not applicable) Fill 1 day early if closed on refill date. Do not fill until: 10/11/2018. To last until: 11/10/2018. Avoid benzodiazepines within 8 hours of opioids  . HYDROcodone-acetaminophen (NORCO/VICODIN) 5-325 MG tablet    Sig: Take 1 tablet by mouth every 8 (eight) hours as needed for severe pain. Must last 30 days    Dispense:  90 tablet    Refill:  0    Chronic Pain: STOP Act (Not applicable) Fill 1 day early if  closed on refill date. Do not fill until: 11/10/2018. To last until: 12/10/2018. Avoid benzodiazepines within 8 hours of opioids  . HYDROcodone-acetaminophen (NORCO/VICODIN) 5-325 MG tablet    Sig: Take 1 tablet by mouth every 8 (eight) hours as needed for severe pain. Must last 30 days    Dispense:  90 tablet    Refill:  0    Chronic Pain: STOP Act (Not applicable) Fill 1 day early if closed on refill date. Do not fill until: 12/10/2018. To  last until: 01/09/2019. Avoid benzodiazepines within 8 hours of opioids   Orders:  Orders Placed This Encounter  Procedures  . Lumbar Epidural Injection    Standing Status:   Future    Standing Expiration Date:   11/11/2018    Scheduling Instructions:     Procedure: Interlaminar Lumbar Epidural Steroid injection (LESI)  L4-5     Laterality: Left-sided     Sedation: With Sedation.     Timeframe: 2 weeks    Order Specific Question:   Where will this procedure be performed?    Answer:   [facility]   Follow-up plan:   Return in about 3 months (around 01/05/2019) for (VV), E/M (MM), in addition, Procedure (w/ sedation): (L) L4-5 LESI #1.      Interventional management options:  Considering:   Diagnostic left L4-5 LESI  Diagnostic midline caudal ESI + epidurogram  Possible bilateral lumbar facetRFA Diagnostic bilateral occipital nerve block Possible bilateral occipital nerveRFA Diagnostic bilateral cervical facet block Possible bilateral cervical facetRFA Diagnostic left-sided sacroiliac joint injection Possible left-sided sacroiliac jointRFA   Palliative PRN treatment(s):   Diagnostic bilateral lumbar facet block #2    Recent Visits Date Type Provider Dept  09/02/18 Procedure visit Delano MetzNaveira, Clotine Heiner, MD [facility]  08/25/18 Office Visit Delano MetzNaveira, Roberts Bon, MD [facility]  07/26/18 Office Visit Barbette MerinoKing, Crystal M, NP [facility]  Showing recent visits within past 90 days and meeting all other  requirements   Today's Visits Date Type Provider Dept  10/11/18 Office Visit Delano MetzNaveira, Lakethia Coppess, MD [facility]  Showing today's visits and meeting all other requirements   Future Appointments Date Type Provider Dept  11/10/18 Appointment Delano MetzNaveira, Tawanda Schall, MD [facility]  Showing future appointments within next 90 days and meeting all other requirements   I discussed the assessment and treatment plan with the patient. The patient was provided an opportunity to ask questions and all were answered. The patient agreed with the plan and demonstrated an understanding of the instructions.  Patient advised to call back or seek an in-person evaluation if the symptoms or condition worsens.  Total duration of non-face-to-face encounter: 20 minutes.  Note by: Oswaldo DoneFrancisco A Arie Gable, MD Date: 10/11/2018; Time: 9:55 AM  Note: This dictation was prepared with Dragon dictation. Any transcriptional errors that may result from this process are unintentional.  Disclaimer:  * Given the special circumstances of the COVID-19 pandemic, the federal government has announced that the Office for Civil Rights (OCR) will exercise its enforcement discretion and will not impose penalties on physicians using telehealth in the event of noncompliance with regulatory requirements under the [employer] Insurance Portability and Accountability Act (HIPAA) in connection with the good faith provision of telehealth during the COVID-19 national public health emergency. (AMA)

## 2018-10-11 ENCOUNTER — Ambulatory Visit: Payer: BC Managed Care – PPO | Attending: Pain Medicine | Admitting: Pain Medicine

## 2018-10-11 ENCOUNTER — Telehealth: Payer: Self-pay | Admitting: *Deleted

## 2018-10-11 ENCOUNTER — Other Ambulatory Visit: Payer: Self-pay

## 2018-10-11 ENCOUNTER — Telehealth: Payer: Self-pay | Admitting: Pain Medicine

## 2018-10-11 DIAGNOSIS — M79604 Pain in right leg: Secondary | ICD-10-CM | POA: Diagnosis not present

## 2018-10-11 DIAGNOSIS — M5442 Lumbago with sciatica, left side: Secondary | ICD-10-CM

## 2018-10-11 DIAGNOSIS — G894 Chronic pain syndrome: Secondary | ICD-10-CM | POA: Diagnosis not present

## 2018-10-11 DIAGNOSIS — M792 Neuralgia and neuritis, unspecified: Secondary | ICD-10-CM

## 2018-10-11 DIAGNOSIS — M7918 Myalgia, other site: Secondary | ICD-10-CM

## 2018-10-11 DIAGNOSIS — G8929 Other chronic pain: Secondary | ICD-10-CM

## 2018-10-11 DIAGNOSIS — M542 Cervicalgia: Secondary | ICD-10-CM | POA: Diagnosis not present

## 2018-10-11 DIAGNOSIS — M5441 Lumbago with sciatica, right side: Secondary | ICD-10-CM | POA: Diagnosis not present

## 2018-10-11 DIAGNOSIS — M5416 Radiculopathy, lumbar region: Secondary | ICD-10-CM

## 2018-10-11 DIAGNOSIS — M79605 Pain in left leg: Secondary | ICD-10-CM

## 2018-10-11 MED ORDER — HYDROCODONE-ACETAMINOPHEN 5-325 MG PO TABS: 1.0000 | ORAL_TABLET | Freq: Three times a day (TID) | ORAL | 0 refills | Status: DC | PRN

## 2018-10-11 MED ORDER — TIZANIDINE HCL 4 MG PO TABS: 4.0000 mg | ORAL_TABLET | Freq: Three times a day (TID) | ORAL | 2 refills | Status: DC

## 2018-10-11 MED ORDER — GABAPENTIN 400 MG PO CAPS: 400.0000 mg | ORAL_CAPSULE | Freq: Four times a day (QID) | ORAL | 2 refills | Status: DC

## 2018-10-11 NOTE — Patient Instructions (Signed)

## 2018-10-11 NOTE — Telephone Encounter (Signed)
Patient states pharmacy is requesting PA for medicaid for new medication.

## 2018-10-12 NOTE — Telephone Encounter (Signed)
PA sent 10/12/18

## 2018-10-12 NOTE — Telephone Encounter (Signed)
Called patient to confirm insurance company and she states she is no longer with [employer] and is now [employer]. Called and notified Juliann Pulse that insurance is incorrect.medicaid number given.

## 2018-10-26 ENCOUNTER — Other Ambulatory Visit: Payer: Self-pay

## 2018-10-26 ENCOUNTER — Ambulatory Visit
Admission: RE | Admit: 2018-10-26 | Discharge: 2018-10-26 | Disposition: A | Discharge status: Home / Self Care | Payer: Medicaid Other | Source: Ambulatory Visit | Attending: Pain Medicine | Admitting: Pain Medicine

## 2018-10-26 ENCOUNTER — Ambulatory Visit (HOSPITAL_BASED_OUTPATIENT_CLINIC_OR_DEPARTMENT_OTHER): Payer: Medicaid Other | Admitting: Pain Medicine

## 2018-10-26 ENCOUNTER — Encounter: Payer: Self-pay | Admitting: Pain Medicine

## 2018-10-26 VITALS — BP 123/76 | HR 70 | Temp 98.4°F | Resp 18 | Ht 62.0 in | Wt 190.0 lb

## 2018-10-26 DIAGNOSIS — M5441 Lumbago with sciatica, right side: Secondary | ICD-10-CM | POA: Insufficient documentation

## 2018-10-26 DIAGNOSIS — M79604 Pain in right leg: Secondary | ICD-10-CM | POA: Insufficient documentation

## 2018-10-26 DIAGNOSIS — M5442 Lumbago with sciatica, left side: Secondary | ICD-10-CM | POA: Diagnosis present

## 2018-10-26 DIAGNOSIS — M47816 Spondylosis without myelopathy or radiculopathy, lumbar region: Secondary | ICD-10-CM | POA: Diagnosis present

## 2018-10-26 DIAGNOSIS — G8929 Other chronic pain: Secondary | ICD-10-CM

## 2018-10-26 DIAGNOSIS — M5136 Other intervertebral disc degeneration, lumbar region: Secondary | ICD-10-CM

## 2018-10-26 DIAGNOSIS — M5416 Radiculopathy, lumbar region: Secondary | ICD-10-CM

## 2018-10-26 DIAGNOSIS — M79605 Pain in left leg: Secondary | ICD-10-CM | POA: Diagnosis present

## 2018-10-26 MED ORDER — SODIUM CHLORIDE 0.9% FLUSH
2.0000 mL | Freq: Once | INTRAVENOUS | Status: AC
  Administered 2018-10-26: 2 mL

## 2018-10-26 MED ORDER — IOHEXOL 180 MG/ML  SOLN
10.0000 mL | Freq: Once | INTRAMUSCULAR | Status: AC
  Administered 2018-10-26: 10 mL via EPIDURAL

## 2018-10-26 MED ORDER — TRIAMCINOLONE ACETONIDE 40 MG/ML IJ SUSP
40.0000 mg | Freq: Once | INTRAMUSCULAR | Status: AC
  Administered 2018-10-26: 40 mg
  Filled 2018-10-26: qty 1

## 2018-10-26 MED ORDER — LACTATED RINGERS IV SOLN
1000.0000 mL | Freq: Once | INTRAVENOUS | Status: AC
  Administered 2018-10-26: 1000 mL via INTRAVENOUS

## 2018-10-26 MED ORDER — FENTANYL CITRATE (PF) 100 MCG/2ML IJ SOLN
25.0000 ug | INTRAMUSCULAR | Status: DC | PRN
  Administered 2018-10-26: 11:00:00 50 ug via INTRAVENOUS
  Filled 2018-10-26: qty 2

## 2018-10-26 MED ORDER — MIDAZOLAM HCL 5 MG/5ML IJ SOLN
1.0000 mg | INTRAMUSCULAR | Status: DC | PRN
  Administered 2018-10-26: 3 mg via INTRAVENOUS
  Filled 2018-10-26: qty 5

## 2018-10-26 MED ORDER — LIDOCAINE HCL 2 % IJ SOLN
20.0000 mL | Freq: Once | INTRAMUSCULAR | Status: AC
  Administered 2018-10-26: 400 mg
  Filled 2018-10-26: qty 20

## 2018-10-26 MED ORDER — SODIUM CHLORIDE (PF) 0.9 % IJ SOLN
INTRAMUSCULAR | Status: AC
  Filled 2018-10-26: qty 10

## 2018-10-26 MED ORDER — ROPIVACAINE HCL 2 MG/ML IJ SOLN
2.0000 mL | Freq: Once | INTRAMUSCULAR | Status: AC
  Administered 2018-10-26: 2 mL via EPIDURAL
  Filled 2018-10-26: qty 10

## 2018-10-26 NOTE — Progress Notes (Signed)
Patient's Name: Diana Quinn  MRN: [id number]  Referring Provider: Delano MetzNaveira, Manpreet Strey, MD  DOB: 1965/03/19  PCP: Fayrene HelperBoswell, [name] H, NP  DOS: 10/26/2018  Note by: Oswaldo DoneFrancisco A Daron Breeding, MD  Service setting: Ambulatory outpatient  Specialty: Interventional Pain Management  Patient type: Established  Location: [facility] (AMB) [facility]  Visit type: Interventional Procedure   Primary Reason for Visit: Interventional Pain Management Treatment. CC: Back Pain and Knee Pain (right)  Procedure:          Anesthesia, Analgesia, Anxiolysis:  Type: Diagnostic Inter-Laminar Epidural Steroid Injection  #1  Region: Lumbar Level: L4-5 Level. Laterality: Left-Sided Paramedial  Type: Moderate (Conscious) Sedation combined with Local Anesthesia Indication(s): Analgesia and Anxiety Route: Intravenous (IV) IV Access: Secured Sedation: Meaningful verbal contact was maintained at all times during the procedure  Local Anesthetic: Lidocaine 1-2%  Position: Prone with head of the table was raised to facilitate breathing.   Indications: 1. DDD (degenerative disc disease), lumbar   2. Chronic low back pain (Primary Area of Pain) (Bilateral) (L>R) w/ sciatica   3. Chronic lower extremity pain (Secondary Area of Pain) (Bilateral) (L>R)   4. Chronic lumbar radiculitis (S1) (Bilateral)   5. Lumbar spondylosis    Pain Score: Pre-procedure: 7 /10 Post-procedure: 0-No pain/10   Pre-op Assessment:  Diana Quinn is a 53 y.o. (year old), female patient, seen today for interventional treatment. She  has a past surgical history that includes Gastric bypass; Abdominal hysterectomy; Cholecystectomy; Tonsillectomy; Gastric bypass open; Oophorectomy; Hernia repair; Abdominal adhesion surgery; Gastrostomy w/ feeding tube; Dilation and curettage of uterus; Splenectomy; and Esophagogastroduodenoscopy. Diana Quinn has a current medication list which includes the following prescription(s): acarbose, alendronate, aripiprazole  er, butalbital-acetaminophen-caffeine, calcium carb-cholecalciferol, cetirizine, cyanocobalamin, ergocalciferol, fyavolv, gabapentin, galcanezumab-gnlm, hydrocodone-acetaminophen, hydrocodone-acetaminophen, hydrocodone-acetaminophen, isosorbide dinitrate, lactulose, levothyroxine, linaclotide, nitroglycerin, ondansetron, oxcarbazepine, pantoprazole, polyethylene glycol, quetiapine, ranolazine, rosuvastatin, sucralfate, sumatriptan, tizanidine, trazodone, and zolpidem, and the following Facility-Administered Medications: fentanyl and midazolam. Her primarily concern today is the Back Pain and Knee Pain (right)  Initial Vital Signs:  Pulse/HCG Rate: 70ECG Heart Rate: 78 Temp: 98.4 F (36.9 C) Resp: 16 BP: (!) 154/93 SpO2: 100 %  BMI: Estimated body mass index is 34.75 kg/m as calculated from the following:   Height as of this encounter: 5\' 2"  (1.575 m).   Weight as of this encounter: 190 lb (86.2 kg).  Risk Assessment: Allergies: Reviewed. She is allergic to depakote [divalproex sodium]; pregabalin; and levetiracetam.  Allergy Precautions: None required Coagulopathies: Reviewed. None identified.  Blood-thinner therapy: None at this time Active Infection(s): Reviewed. None identified. Diana Quinn is afebrile  Site Confirmation: Diana Quinn was asked to confirm the procedure and laterality before marking the site Procedure checklist: Completed Consent: Before the procedure and under the influence of no sedative(s), amnesic(s), or anxiolytics, the patient was informed of the treatment options, risks and possible complications. To fulfill our ethical and legal obligations, as recommended by the American Medical Association's Code of Ethics, I have informed the patient of my clinical impression; the nature and purpose of the treatment or procedure; the risks, benefits, and possible complications of the intervention; the alternatives, including doing nothing; the risk(s) and benefit(s) of the  alternative treatment(s) or procedure(s); and the risk(s) and benefit(s) of doing nothing. The patient was provided information about the general risks and possible complications associated with the procedure. These may include, but are not limited to: failure to achieve desired goals, infection, bleeding, organ or nerve damage, allergic reactions, paralysis, and death. In addition, the patient  was informed of those risks and complications associated to Spine-related procedures, such as failure to decrease pain; infection (i.e.: Meningitis, epidural or intraspinal abscess); bleeding (i.e.: epidural hematoma, subarachnoid hemorrhage, or any other type of intraspinal or peri-dural bleeding); organ or nerve damage (i.e.: Any type of peripheral nerve, nerve root, or spinal cord injury) with subsequent damage to sensory, motor, and/or autonomic systems, resulting in permanent pain, numbness, and/or weakness of one or several areas of the body; allergic reactions; (i.e.: anaphylactic reaction); and/or death. Furthermore, the patient was informed of those risks and complications associated with the medications. These include, but are not limited to: allergic reactions (i.e.: anaphylactic or anaphylactoid reaction(s)); adrenal axis suppression; blood sugar elevation that in diabetics may result in ketoacidosis or comma; water retention that in patients with history of congestive heart failure may result in shortness of breath, pulmonary edema, and decompensation with resultant heart failure; weight gain; swelling or edema; medication-induced neural toxicity; particulate matter embolism and blood vessel occlusion with resultant organ, and/or nervous system infarction; and/or aseptic necrosis of one or more joints. Finally, the patient was informed that Medicine is not an exact science; therefore, there is also the possibility of unforeseen or unpredictable risks and/or possible complications that may result in a  catastrophic outcome. The patient indicated having understood very clearly. We have given the patient no guarantees and we have made no promises. Enough time was given to the patient to ask questions, all of which were answered to the patient's satisfaction. Diana Quinn has indicated that she wanted to continue with the procedure. Attestation: I, the ordering provider, attest that I have discussed with the patient the benefits, risks, side-effects, alternatives, likelihood of achieving goals, and potential problems during recovery for the procedure that I have provided informed consent. Date  Time: 10/26/2018 10:08 AM  Pre-Procedure Preparation:  Monitoring: As per clinic protocol. Respiration, ETCO2, SpO2, BP, heart rate and rhythm monitor placed and checked for adequate function Safety Precautions: Patient was assessed for positional comfort and pressure points before starting the procedure. Time-out: I initiated and conducted the "Time-out" before starting the procedure, as per protocol. The patient was asked to participate by confirming the accuracy of the "Time Out" information. Verification of the correct person, site, and procedure were performed and confirmed by me, the nursing staff, and the patient. "Time-out" conducted as per Joint Commission's Universal Protocol (UP.01.01.01). Time: 1111  Description of Procedure:          Target Area: The interlaminar space, initially targeting the lower laminar border of the superior vertebral body. Approach: Paramedial approach. Area Prepped: Entire Posterior Lumbar Region Prepping solution: DuraPrep (Iodine Povacrylex [0.7% available iodine] and Isopropyl Alcohol, 74% w/w) Safety Precautions: Aspiration looking for blood return was conducted prior to all injections. At no point did we inject any substances, as a needle was being advanced. No attempts were made at seeking any paresthesias. Safe injection practices and needle disposal techniques used.  Medications properly checked for expiration dates. SDV (single dose vial) medications used. Description of the Procedure: Protocol guidelines were followed. The procedure needle was introduced through the skin, ipsilateral to the reported pain, and advanced to the target area. Bone was contacted and the needle walked caudad, until the lamina was cleared. The epidural space was identified using "loss-of-resistance technique" with 2-3 ml of PF-NaCl (0.9% NSS), in a 5cc LOR glass syringe.  Vitals:   10/26/18 1125 10/26/18 1135 10/26/18 1145 10/26/18 1155  BP: (!) 160/92 125/90 125/90 123/76  Pulse:  Resp: 14 16 20 18   Temp:      SpO2: 100% 100% 100% 100%  Weight:      Height:        Start Time: 1113 hrs. End Time: 1119 hrs.  Materials:  Needle(s) Type: Epidural needle Gauge: 17G Length: 3.5-in Medication(s): Please see orders for medications and dosing details.  Imaging Guidance (Spinal):          Type of Imaging Technique: Fluoroscopy Guidance (Spinal) Indication(s): Assistance in needle guidance and placement for procedures requiring needle placement in or near specific anatomical locations not easily accessible without such assistance. Exposure Time: Please see nurses notes. Contrast: Before injecting any contrast, we confirmed that the patient did not have an allergy to iodine, shellfish, or radiological contrast. Once satisfactory needle placement was completed at the desired level, radiological contrast was injected. Contrast injected under live fluoroscopy. No contrast complications. See chart for type and volume of contrast used. Fluoroscopic Guidance: I was personally present during the use of fluoroscopy. "Tunnel Vision Technique" used to obtain the best possible view of the target area. Parallax error corrected before commencing the procedure. "Direction-depth-direction" technique used to introduce the needle under continuous pulsed fluoroscopy. Once target was reached,  antero-posterior, oblique, and lateral fluoroscopic projection used confirm needle placement in all planes. Images permanently stored in EMR. Interpretation: I personally interpreted the imaging intraoperatively. Adequate needle placement confirmed in multiple planes. Appropriate spread of contrast into desired area was observed. No evidence of afferent or efferent intravascular uptake. No intrathecal or subarachnoid spread observed. Permanent images saved into the patient's record.  Antibiotic Prophylaxis:   Anti-infectives (From admission, onward)   None     Indication(s): None identified  Post-operative Assessment:  Post-procedure Vital Signs:  Pulse/HCG Rate: 7080 Temp: 98.4 F (36.9 C) Resp: 18 BP: 123/76 SpO2: 100 %  EBL: None  Complications: No immediate post-treatment complications observed by team, or reported by patient.  Note: The patient tolerated the entire procedure well. A repeat set of vitals were taken after the procedure and the patient was kept under observation following institutional policy, for this type of procedure. Post-procedural neurological assessment was performed, showing return to baseline, prior to discharge. The patient was provided with post-procedure discharge instructions, including a section on how to identify potential problems. Should any problems arise concerning this procedure, the patient was given instructions to immediately contact us, at any time, without hesitation. In any case, we plan to contact the patient by telephone for a follow-up status report regarding this interventional procedure.  Comments:  No additional relevant information.  Plan of Care  Orders:  Orders Placed This Encounter  Procedures  . Lumbar Epidural Injection    Scheduling Instructions:     Procedure: Interlaminar LESI L4-5     Laterality: Left-sided     Sedation: Patient's choice     Timeframe:  Today    Order Specific Question:   Where will this procedure be  performed?    Answer:   [facility]  . DG PAIN [facility] C-ARM 1-60 MIN NO REPORT    Intraoperative interpretation by procedural physician at [facility].    Standing Status:   Standing    Number of Occurrences:   1    Order Specific Question:   Reason for exam:    Answer:   Assistance in needle guidance and placement for procedures requiring needle placement in or near specific anatomical locations not easily accessible without such assistance.  . Provider attestation of  informed consent for procedure/surgical case    I, the ordering provider, attest that I have discussed with the patient the benefits, risks, side effects, alternatives, likelihood of achieving goals and potential problems during recovery for the procedure that I have provided informed consent.    Standing Status:   Standing    Number of Occurrences:   1  . Informed Consent Details: Transcribe to consent form and obtain patient signature    Standing Status:   Standing    Number of Occurrences:   1    Order Specific Question:   Procedure    Answer:   Lumbar epidural steroid injection under fluoroscopic guidance. (See notes for level and laterality.)    Order Specific Question:   Surgeon    Answer:   Milia Warth A. Laban EmperorNaveira, MD    Order Specific Question:   Indication/Reason    Answer:   Low back pain and/or leg pain secondary to lumbar radiculitis/radiculopathy   Chronic Opioid Analgesic:  Hydrocodone/APAP 5/325, 1 tab PO q 8 hrs (15 mg/day of hydrocodone) MME/day:15 mg/day.   Medications ordered for procedure: Meds ordered this encounter  Medications  . iohexol (OMNIPAQUE) 180 MG/ML injection 10 mL    Must be Myelogram-compatible. If not available, you may substitute with a water-soluble, non-ionic, hypoallergenic, myelogram-compatible radiological contrast medium.  Marland Kitchen. lidocaine (XYLOCAINE) 2 % (with pres) injection 400 mg  . lactated ringers infusion 1,000 mL  . midazolam (VERSED) 5 MG/5ML injection 1-2  mg    Make sure Flumazenil is available in the pyxis when using this medication. If oversedation occurs, administer 0.2 mg IV over 15 sec. If after 45 sec no response, administer 0.2 mg again over 1 min; may repeat at 1 min intervals; not to exceed 4 doses (1 mg)  . fentaNYL (SUBLIMAZE) injection 25-50 mcg    Make sure Narcan is available in the pyxis when using this medication. In the event of respiratory depression (RR< 8/min): Titrate NARCAN (naloxone) in increments of 0.1 to 0.2 mg IV at 2-3 minute intervals, until desired degree of reversal.  . sodium chloride flush (NS) 0.9 % injection 2 mL  . ropivacaine (PF) 2 mg/mL (0.2%) (NAROPIN) injection 2 mL  . triamcinolone acetonide (KENALOG-40) injection 40 mg   Medications administered: We administered iohexol, lidocaine, lactated ringers, midazolam, fentaNYL, sodium chloride flush, ropivacaine (PF) 2 mg/mL (0.2%), and triamcinolone acetonide.  See the medical record for exact dosing, route, and time of administration.  Follow-up plan:   Return for (VV), 2 wk PP-F/U Eval.       Interventional management options:  Considering:   Diagnostic left L4-5 LESI  Diagnostic midline caudal ESI + epidurogram  Possible bilateral lumbar facetRFA Diagnostic bilateral occipital nerve block Possible bilateral occipital nerveRFA Diagnostic bilateral cervical facet block Possible bilateral cervical facetRFA Diagnostic left-sided sacroiliac joint injection Possible left-sided sacroiliac jointRFA   Palliative PRN treatment(s):   Diagnostic bilateral lumbar facet block #2     Recent Visits Date Type Provider Dept  10/11/18 Office Visit Delano MetzNaveira, Chevis Weisensel, MD [facility]  09/02/18 Procedure visit Delano MetzNaveira, Shion Bluestein, MD [facility]  08/25/18 Office Visit Delano MetzNaveira, Kazden Largo, MD [facility]  Showing recent visits within past 90 days and meeting all other requirements   Today's Visits Date Type Provider Dept   10/26/18 Procedure visit Delano MetzNaveira, Ariani Seier, MD [facility]  Showing today's visits and meeting all other requirements   Future Appointments Date Type Provider Dept  11/24/18 Appointment Delano MetzNaveira, Kieren Adkison, MD [facility]  12/22/18 Appointment Delano MetzNaveira, Emaya Preston, MD [facility]  Showing future appointments within next 90 days and meeting all other requirements   Disposition: Discharge home  Discharge Date & Time: 10/26/2018; 1200 hrs.   Primary Care Physician: Fayrene HelperBoswell, [name] H, NP [facility] Note by: Oswaldo DoneFrancisco A Carter Kaman, MD Date: 10/26/2018; Time: 12:39 PM  Disclaimer:  Medicine is not an Visual merchandiserexact science. The only guarantee in medicine is that nothing is guaranteed. It is important to note that the decision to proceed with this intervention was based on the information collected from the patient. The Data and conclusions were drawn from the patient's questionnaire, the interview, and the physical examination. Because the information was provided in large part by the patient, it cannot be guaranteed that it has not been purposely or unconsciously manipulated. Every effort has been made to obtain as much relevant data as possible for this evaluation. It is important to note that the conclusions that lead to this procedure are derived in large part from the available data. Always take into account that the treatment will also be dependent on availability of resources and existing treatment guidelines, considered by other Pain Management Practitioners as being common knowledge and practice, at the time of the intervention. For Medico-Legal purposes, it is also important to point out that variation in procedural techniques and pharmacological choices are the acceptable norm. The indications, contraindications, technique, and results of the above procedure should only be interpreted and judged by a Board-Certified Interventional Pain  Specialist with extensive familiarity and expertise in the same exact procedure and technique.

## 2018-10-26 NOTE — Patient Instructions (Addendum)
____________________________________________________________________________________________  Post-Procedure Discharge Instructions  Instructions:  Apply ice:   Purpose: This will minimize any swelling and discomfort after procedure.   When: Day of procedure, as soon as you get home.  How: Fill a plastic sandwich bag with crushed ice. Cover it with a small towel and apply to injection site.  How long: (15 min on, 15 min off) Apply for 15 minutes then remove x 15 minutes.  Repeat sequence on day of procedure, until you go to bed.  Apply heat:   Purpose: To treat any soreness and discomfort from the procedure.  When: Starting the next day after the procedure.  How: Apply heat to procedure site starting the day following the procedure.  How long: May continue to repeat daily, until discomfort goes away.  Food intake: Start with clear liquids (like water) and advance to regular food, as tolerated.   Physical activities: Keep activities to a minimum for the first 8 hours after the procedure. After that, then as tolerated.  Driving: If you have received any sedation, be responsible and do not drive. You are not allowed to drive for 24 hours after having sedation.  Blood thinner: (Applies only to those taking blood thinners) You may restart your blood thinner 6 hours after your procedure.  Insulin: (Applies only to Diabetic patients taking insulin) As soon as you can eat, you may resume your normal dosing schedule.  Infection prevention: Keep procedure site clean and dry. Shower daily and clean area with soap and water.  Post-procedure Pain Diary: Extremely important that this be done correctly and accurately. Recorded information will be used to determine the next step in treatment. For the purpose of accuracy, follow these rules:  Evaluate only the area treated. Do not report or include pain from an untreated area. For the purpose of this evaluation, ignore all other areas of pain,  except for the treated area.  After your procedure, avoid taking a long nap and attempting to complete the pain diary after you wake up. Instead, set your alarm clock to go off every hour, on the hour, for the initial 8 hours after the procedure. Document the duration of the numbing medicine, and the relief you are getting from it.  Do not go to sleep and attempt to complete it later. It will not be accurate. If you received sedation, it is likely that you were given a medication that may cause amnesia. Because of this, completing the diary at a later time may cause the information to be inaccurate. This information is needed to plan your care.  Follow-up appointment: Keep your post-procedure follow-up evaluation appointment after the procedure (usually 2 weeks for most procedures, 6 weeks for radiofrequencies). DO NOT FORGET to bring you pain diary with you.   Expect: (What should I expect to see with my procedure?)  From numbing medicine (AKA: Local Anesthetics): Numbness or decrease in pain. You may also experience some weakness, which if present, could last for the duration of the local anesthetic.  Onset: Full effect within 15 minutes of injected.  Duration: It will depend on the type of local anesthetic used. On the average, 1 to 8 hours.   From steroids (Applies only if steroids were used): Decrease in swelling or inflammation. Once inflammation is improved, relief of the pain will follow.  Onset of benefits: Depends on the amount of swelling present. The more swelling, the longer it will take for the benefits to be seen. In some cases, up to 10 days.    Duration: Steroids will stay in the system x 2 weeks. Duration of benefits will depend on multiple posibilities including persistent irritating factors.  Side-effects: If present, they may typically last 2 weeks (the duration of the steroids).  Frequent: Cramps (if they occur, drink Gatorade and take over-the-counter Magnesium 450-500 mg  once to twice a day); water retention with temporary weight gain; increases in blood sugar; decreased immune system response; increased appetite.  Occasional: Facial flushing (red, warm cheeks); mood swings; menstrual changes.  Uncommon: Long-term decrease or suppression of natural hormones; bone thinning. (These are more common with higher doses or more frequent use. This is why we prefer that our patients avoid having any injection therapies in other practices.)   Very Rare: Severe mood changes; psychosis; aseptic necrosis.  From procedure: Some discomfort is to be expected once the numbing medicine wears off. This should be minimal if ice and heat are applied as instructed.  Call if: (When should I call?)  You experience numbness and weakness that gets worse with time, as opposed to wearing off.  New onset bowel or bladder incontinence. (Applies only to procedures done in the spine)  Emergency Numbers:  Durning business hours (Monday - Thursday, 8:00 AM - 4:00 PM) (Friday, 9:00 AM - 12:00 Noon): [phone]  After hours: [phone]  NOTE: If you are having a problem and are unable connect with, or to talk to a provider, then go to your nearest urgent care or emergency department. If the problem is serious and urgent, please call 911. ____________________________________________________________________________________________   Pain Management Discharge Instructions  General Discharge Instructions :  If you need to reach your doctor call: Monday-Friday 8:00 am - 4:00 pm at [phone] or toll free [phone].  After clinic hours [phone] to have operator reach doctor.  Bring all of your medication bottles to all your appointments in the pain clinic.  To cancel or reschedule your appointment with Pain Management please remember to call 24 hours in advance to avoid a fee.  Refer to the educational materials which you have been given on: General Risks, I had my  Procedure. Discharge Instructions, Post Sedation.  Post Procedure Instructions:  The drugs you were given will stay in your system until tomorrow, so for the next 24 hours you should not drive, make any legal decisions or drink any alcoholic beverages.  You may eat anything you prefer, but it is better to start with liquids then soups and crackers, and gradually work up to solid foods.  Please notify your doctor immediately if you have any unusual bleeding, trouble breathing or pain that is not related to your normal pain.  Depending on the type of procedure that was done, some parts of your body may feel week and/or numb.  This usually clears up by tonight or the next day.  Walk with the use of an assistive device or accompanied by an adult for the 24 hours.  You may use ice on the affected area for the first 24 hours.  Put ice in a Ziploc bag and cover with a towel and place against area 15 minutes on 15 minutes off.  You may switch to heat after 24 hours.Epidural Steroid Injection Patient Information  Description: The epidural space surrounds the nerves as they exit the spinal cord.  In some patients, the nerves can be compressed and inflamed by a bulging disc or a tight spinal canal (spinal stenosis).  By injecting steroids into the epidural space, we can bring irritated nerves   into direct contact with a potentially helpful medication.  These steroids act directly on the irritated nerves and can reduce swelling and inflammation which often leads to decreased pain.  Epidural steroids may be injected anywhere along the spine and from the neck to the low back depending upon the location of your pain.   After numbing the skin with local anesthetic (like Novocaine), a small needle is passed into the epidural space slowly.  You may experience a sensation of pressure while this is being done.  The entire block usually last less than 10 minutes.  Conditions which may be treated by epidural  steroids:   Low back and leg pain  Neck and arm pain  Spinal stenosis  Post-laminectomy syndrome  Herpes zoster (shingles) pain  Pain from compression fractures  Preparation for the injection:  1. Do not eat any solid food or dairy products within 8 hours of your appointment.  2. You may drink clear liquids up to 3 hours before appointment.  Clear liquids include water, black coffee, juice or soda.  No milk or cream please. 3. You may take your regular medication, including pain medications, with a sip of water before your appointment  Diabetics should hold regular insulin (if taken separately) and take 1/2 normal NPH dos the morning of the procedure.  Carry some sugar containing items with you to your appointment. 4. A driver must accompany you and be prepared to drive you home after your procedure.  5. Bring all your current medications with your. 6. An IV may be inserted and sedation may be given at the discretion of the physician.   7. A blood pressure cuff, EKG and other monitors will often be applied during the procedure.  Some patients may need to have extra oxygen administered for a short period. 8. You will be asked to provide medical information, including your allergies, prior to the procedure.  We must know immediately if you are taking blood thinners (like Coumadin/Warfarin)  Or if you are allergic to IV iodine contrast (dye). We must know if you could possible be pregnant.  Possible side-effects:  Bleeding from needle site  Infection (rare, may require surgery)  Nerve injury (rare)  Numbness & tingling (temporary)  Difficulty urinating (rare, temporary)  Spinal headache ( a headache worse with upright posture)  Light -headedness (temporary)  Pain at injection site (several days)  Decreased blood pressure (temporary)  Weakness in arm/leg (temporary)  Pressure sensation in back/neck (temporary)  Call if you experience:  Fever/chills associated with  headache or increased back/neck pain.  Headache worsened by an upright position.  New onset weakness or numbness of an extremity below the injection site  Hives or difficulty breathing (go to the emergency room)  Inflammation or drainage at the infection site  Severe back/neck pain  Any new symptoms which are concerning to you  Please note:  Although the local anesthetic injected can often make your back or neck feel good for several hours after the injection, the pain will likely return.  It takes 3-7 days for steroids to work in the epidural space.  You may not notice any pain relief for at least that one week.  If effective, we will often do a series of three injections spaced 3-6 weeks apart to maximally decrease your pain.  After the initial series, we generally will wait several months before considering a repeat injection of the same type.  If you have any questions, please call [phone] [facility] 

## 2018-10-26 NOTE — Progress Notes (Signed)
Safety precautions to be maintained throughout the outpatient stay will include: orient to surroundings, keep bed in low position, maintain call bell within reach at all times, provide assistance with transfer out of bed and ambulation.  

## 2018-10-27 ENCOUNTER — Telehealth: Payer: Self-pay

## 2018-10-27 NOTE — Telephone Encounter (Signed)
Pt was called and message was left on answering service. 

## 2018-11-04 DIAGNOSIS — I10 Essential (primary) hypertension: Secondary | ICD-10-CM | POA: Insufficient documentation

## 2018-11-10 ENCOUNTER — Encounter: Payer: BC Managed Care – PPO | Admitting: Pain Medicine

## 2018-11-11 DIAGNOSIS — I471 Supraventricular tachycardia, unspecified: Secondary | ICD-10-CM | POA: Insufficient documentation

## 2018-11-22 ENCOUNTER — Encounter: Payer: Self-pay | Admitting: Pain Medicine

## 2018-11-23 NOTE — Progress Notes (Signed)
Pain Management Virtual Encounter Note - Virtual Visit via Telephone Telehealth (real-time audio visits between healthcare provider and patient).   Patient's Phone No. & Preferred Pharmacy:  [phone] (home); [phone] (mobile); (Preferred) [phone] [email or web address]  [id number] [place], [place] - [address] [facility] [address] [place] [id number] Phone: [phone] Fax: [phone]    Pre-screening note:  Our staff contacted Diana Quinn and offered her an "in person", "face-to-face" appointment versus a telephone encounter. She indicated preferring the telephone encounter, at this time.   Reason for Virtual Visit: COVID-19*  Social distancing based on CDC and AMA recommendations.   I contacted Diana Dayicole Tomasini on 11/24/2018 via telephone.      I clearly identified myself as Oswaldo DoneFrancisco A Krishon Adkison, MD. I verified that I was speaking with the correct person using two identifiers (Name: Diana Dayicole Medal, and date of birth: 1966-01-20).  Advanced Informed Consent I sought verbal advanced consent from Diana DayNicole Mountz for virtual visit interactions. I informed Diana Quinn of possible security and privacy concerns, risks, and limitations associated with providing "not-in-person" medical evaluation and management services. I also informed Diana Quinn of the availability of "in-person" appointments. Finally, I informed her that there would be a charge for the virtual visit and that she could be  personally, fully or partially, financially responsible for it. Diana Quinn expressed understanding and agreed to proceed.   Historic Elements   Diana Quinn is a 53 y.o. year old, female patient evaluated today after her last encounter by our practice on 10/27/2018. Diana Quinn  has a past medical history of Anemia, Gastric bypass status for obesity, H/O unstable angina, Hypoglycemia, MI (myocardial infarction) (HCC), Orthostatic  hypotension, Osteoporosis, Sciatic nerve disease, Seizures (HCC), and Thyroid disease. She also  has a past surgical history that includes Gastric bypass; Abdominal hysterectomy; Cholecystectomy; Tonsillectomy; Gastric bypass open; Oophorectomy; Hernia repair; Abdominal adhesion surgery; Gastrostomy w/ feeding tube; Dilation and curettage of uterus; Splenectomy; and Esophagogastroduodenoscopy. Diana Quinn has a current medication list which includes the following prescription(s): acarbose, alendronate, aripiprazole er, butalbital-acetaminophen-caffeine, calcium carb-cholecalciferol, cetirizine, cyanocobalamin, ergocalciferol, escitalopram, fyavolv, gabapentin, galcanezumab-gnlm, hydrocodone-acetaminophen, hydrocodone-acetaminophen, isosorbide dinitrate, levothyroxine, linaclotide, nitroglycerin, ondansetron, oxcarbazepine, pantoprazole, polyethylene glycol, ranolazine, rosuvastatin, tizanidine, trazodone, zolpidem, and hydrocodone-acetaminophen. She  reports that she has never smoked. She has never used smokeless tobacco. She reports previous alcohol use. She reports that she does not use drugs. Diana Quinn is allergic to depakote [divalproex sodium]; pregabalin; and levetiracetam.   HPI  Today, she is being contacted for a post-procedure assessment.  The patient has enough medications to last until January 09, 2019.  Post-Procedure Evaluation  Procedure: Diagnostic left-sided L4-5 LESI #1 under fluoroscopic guidance and IV sedation Pre-procedure pain level:  7/10 Post-procedure: 0/10 (100% relief)  Sedation: Sedation provided.  Effectiveness during initial hour after procedure(Ultra-Short Term Relief): 100 %   Local anesthetic used: Long-acting (4-6 hours) Effectiveness: Defined as any analgesic benefit obtained secondary to the administration of local anesthetics. This carries significant diagnostic value as to the etiological location, or anatomical origin, of the pain. Duration of benefit is expected  to coincide with the duration of the local anesthetic used.  Effectiveness during initial 4-6 hours after procedure(Short-Term Relief): 100 %   Long-term benefit: Defined as any relief past the pharmacologic duration of the local anesthetics.  Effectiveness past the initial 6 hours after procedure(Long-Term Relief): 60 %(lasting 2 weeks)   Current benefits: Defined as benefit that persist at this time.  Analgesia:  >50% relief Function: Ms. Heiserman reports improvement in function ROM: Ms. Oldham reports improvement in ROM  Pharmacotherapy Assessment  Analgesic: Hydrocodone/APAP 5/325, 1 tab PO q 8 hrs (15 mg/day of hydrocodone) MME/day:15 mg/day.   Monitoring: Pharmacotherapy: No side-effects or adverse reactions reported. [place] PMP: PDMP reviewed during this encounter.       Compliance: No problems identified. Effectiveness: Clinically acceptable. Plan: Refer to "POC".  UDS:  Summary  Date Value Ref Range Status  03/31/2018 FINAL  Final    Comment:    ==================================================================== [facility] COMP DRUG ANALYSIS,UR ==================================================================== Test                             Result       Flag       Units Drug Present and Declared for Prescription Verification   7-aminoclonazepam              84           EXPECTED   ng/mg creat    7-aminoclonazepam is an expected metabolite of clonazepam. Source    of clonazepam is a scheduled prescription medication.   Butalbital                     PRESENT      EXPECTED   Gabapentin                     PRESENT      EXPECTED   Oxcarbazepine MHD              PRESENT      EXPECTED    Oxcarbazepine MHD is the active metabolite of oxcarbazepine and    eslicarbazepine.   Quetiapine                     PRESENT      EXPECTED   Aripiprazole                   PRESENT      EXPECTED   Acetaminophen                  PRESENT      EXPECTED Drug Present not Declared for  Prescription Verification   Baclofen                       PRESENT      UNEXPECTED   Ibuprofen                      PRESENT      UNEXPECTED Drug Absent but Declared for Prescription Verification   Oxycodone                      Not Detected UNEXPECTED ng/mg creat   Tizanidine                     Not Detected UNEXPECTED    Tizanidine, as indicated in the declared medication list, is not    always detected even when used as directed.   Salicylate                     Not Detected UNEXPECTED    Aspirin, as indicated in the declared medication list, is not    always detected even when used as directed. ==================================================================== Test  Result    Flag   Units      Ref Range   Creatinine              102              mg/dL      >=20 ==================================================================== Declared Medications:  The flagging and interpretation on this report are based on the  following declared medications.  Unexpected results may arise from  inaccuracies in the declared medications.  **Note: The testing scope of this panel includes these medications:  Aripiprazole (Abilify)  Butalbital (Butalbital/APAP/Caffeine)  Clonazepam (Klonopin)  Gabapentin (Neurontin)  Oxcarbazepine (Trileptal)  Oxycodone (Percocet)  Quetiapine (Seroquel)  **Note: The testing scope of this panel does not include small to  moderate amounts of these reported medications:  Acetaminophen (Butalbital/APAP/Caffeine)  Acetaminophen (Percocet)  Aspirin  Tizanidine (Zanaflex)  **Note: The testing scope of this panel does not include following  reported medications:  Alendronate (Fosamax)  Caffeine (Butalbital/APAP/Caffeine)  Calcium (Calcium/Cholecalciferol)  Cholecalciferol (Calcium/Cholecalciferol)  Lactulose (Constulose)  Levothyroxine  Nitroglycerin (Nitrostat)  Ondansetron (Zofran)  Prednisone (Sterapred)  Rosuvastatin (Crestor)   Sumatriptan (Imitrex)  Vitamin B12  Vitamin D2 ==================================================================== For clinical consultation, please call [phone]. ====================================================================    Laboratory Chemistry Profile (12 mo)  Renal: 03/31/2018: BUN/Creatinine Ratio 9 09/24/2018: BUN 8; Creatinine, Ser 0.70  Lab Results  Component Value Date   GFRAA >60 09/24/2018   GFRNONAA >60 09/24/2018   Hepatic: 09/24/2018: Albumin 4.2 Lab Results  Component Value Date   AST 27 09/24/2018   ALT 28 09/24/2018   Other: 03/31/2018: 25-Hydroxy, Vitamin D 30; 25-Hydroxy, Vitamin D-2 25; 25-Hydroxy, Vitamin D-3 4.8; CRP <1; Sed Rate 21 08/05/2018: Vitamin B-12 303 Note: Above Lab results reviewed.  Imaging  Last 90 days:  Ct Abdomen Pelvis W Contrast  Result Date: 09/24/2018 CLINICAL DATA:  History of gastric ulcer, epigastric pain EXAM: CT ABDOMEN AND PELVIS WITH CONTRAST TECHNIQUE: Multidetector CT imaging of the abdomen and pelvis was performed using the standard protocol following bolus administration of intravenous contrast. CONTRAST:  131mL OMNIPAQUE IOHEXOL 300 MG/ML  SOLN COMPARISON:  CT 07/11/2018 FINDINGS: Lower chest: Lung bases demonstrate no acute consolidation or effusion. Mild cardiomegaly. Small hiatal hernia. Hepatobiliary: No focal liver abnormality is seen. Status post cholecystectomy. No biliary dilatation. Pancreas: Slight indistinct/edematous appearance at the head of pancreas and duodenal bulb. No ductal dilatation Spleen: Status post splenectomy. Splenules in the left upper quadrant. Adrenals/Urinary Tract: Adrenal glands are normal. Kidneys show no hydronephrosis. The bladder is normal. Stomach/Bowel: Status post gastric bypass. No dilated small bowel. No colon wall thickening. Prominent appendix measuring up to 8 mm in size but no significant surrounding inflammation. Vascular/Lymphatic: Nonaneurysmal aorta. No significantly  enlarged lymph nodes Reproductive: Status post hysterectomy. No adnexal masses. Other: Negative for free air or free fluid Musculoskeletal: No acute or suspicious osseous abnormality IMPRESSION: 1. Slightly indistinct appearance at the pancreaticoduodenal groove, possibly representing mild pancreatitis or duodenitis. Suggest correlation with enzymes. 2. Status post gastric bypass surgery.  No bowel obstruction 3. Prominent appearing appendix up to 8 mm but no surrounding inflammation Electronically Signed   By: Donavan Foil M.D.   On: 09/24/2018 03:47   Dg Pain Clinic C-arm 1-60 Min No Report  Result Date: 10/26/2018 Fluoro was used, but no Radiologist interpretation will be provided. Please refer to "NOTES" tab for provider progress note.  Dg Pain Clinic C-arm 1-60 Min No Report  Result Date: 10/26/2018 There is no Radiologist interpretation  for this exam.  Assessment  The primary encounter diagnosis was Chronic pain syndrome. Diagnoses of Chronic low back pain (Primary Area of Pain) (Bilateral) (L>R) w/ sciatica, Chronic lower extremity pain (Secondary Area of Pain) (Bilateral) (L>R), Chronic neck pain (Tertiary Area of Pain) (Bilateral) (L>R), and Occipital headache (Fourth Area of Pain) (Bilateral) (L>R) were also pertinent to this visit.  Plan of Care  I have discontinued Joni Reiningicole Swantek's lactulose, QUEtiapine, SUMAtriptan, and sucralfate. I am also having her maintain her alendronate, ARIPiprazole ER, Calcium Carb-Cholecalciferol, cyanocobalamin, ergocalciferol, levothyroxine, nitroGLYCERIN, Fyavolv, ondansetron, rosuvastatin, ranolazine, isosorbide dinitrate, cetirizine, acarbose, butalbital-acetaminophen-caffeine, Galcanezumab-gnlm, zolpidem, polyethylene glycol, linaclotide, pantoprazole, traZODone, OXcarbazepine, tiZANidine, gabapentin, HYDROcodone-acetaminophen, HYDROcodone-acetaminophen, HYDROcodone-acetaminophen, and escitalopram.  Pharmacotherapy (Medications Ordered): No orders of  the defined types were placed in this encounter.  Orders:  Orders Placed This Encounter  Procedures  . Lumbar Epidural Injection    Standing Status:   Future    Standing Expiration Date:   12/24/2018    Scheduling Instructions:     Procedure: Interlaminar Lumbar Epidural Steroid injection (LESI)  L4-5     Laterality: Left-sided     Sedation: Patient's choice.     Timeframe: ASAA    Order Specific Question:   Where will this procedure be performed?    Answer:   [facility]   Follow-up plan:   Return in about 6 weeks (around 01/05/2019) for Procedure (w/ sedation): (L) L4-5 LESI #2.      Interventional management options:  Considering:   Diagnostic left L4-5 LESI  Diagnostic midline caudal ESI + epidurogram  Possible bilateral lumbar facetRFA Diagnostic bilateral occipital nerve block Possible bilateral occipital nerveRFA Diagnostic bilateral cervical facet block Possible bilateral cervical facetRFA Diagnostic left-sided sacroiliac joint injection Possible left-sided sacroiliac jointRFA   Palliative PRN treatment(s):   Diagnostic bilateral lumbar facet block #2      Recent Visits Date Type Provider Dept  10/26/18 Procedure visit Delano MetzNaveira, Latrecia Capito, MD [facility]  10/11/18 Office Visit Delano MetzNaveira, Lajuan Kovaleski, MD [facility]  09/02/18 Procedure visit Delano MetzNaveira, Illyanna Petillo, MD [facility]  Showing recent visits within past 90 days and meeting all other requirements   Today's Visits Date Type Provider Dept  11/24/18 Office Visit Delano MetzNaveira, Ysabella Babiarz, MD [facility]  Showing today's visits and meeting all other requirements   Future Appointments Date Type Provider Dept  12/22/18 Appointment Delano MetzNaveira, Ketty Bitton, MD [facility]  Showing future appointments within next 90 days and meeting all other requirements   I discussed the assessment and treatment plan with the patient. The patient was provided an opportunity  to ask questions and all were answered. The patient agreed with the plan and demonstrated an understanding of the instructions.  Patient advised to call back or seek an in-person evaluation if the symptoms or condition worsens.  Total duration of non-face-to-face encounter: 13 minutes.  Note by: Oswaldo DoneFrancisco A Gid Schoffstall, MD Date: 11/24/2018; Time: 12:25 PM  Note: This dictation was prepared with Dragon dictation. Any transcriptional errors that may result from this process are unintentional.  Disclaimer:  * Given the special circumstances of the COVID-19 pandemic, the federal government has announced that the Office for Civil Rights (OCR) will exercise its enforcement discretion and will not impose penalties on physicians using telehealth in the event of noncompliance with regulatory requirements under the [employer] Insurance Portability and Accountability Act (HIPAA) in connection with the good faith provision of telehealth during the COVID-19 national public health emergency. (AMA)

## 2018-11-24 ENCOUNTER — Ambulatory Visit: Payer: Medicaid Other | Attending: Pain Medicine | Admitting: Pain Medicine

## 2018-11-24 ENCOUNTER — Other Ambulatory Visit: Payer: Self-pay

## 2018-11-24 DIAGNOSIS — M79604 Pain in right leg: Secondary | ICD-10-CM | POA: Diagnosis not present

## 2018-11-24 DIAGNOSIS — R51 Headache: Secondary | ICD-10-CM

## 2018-11-24 DIAGNOSIS — M5441 Lumbago with sciatica, right side: Secondary | ICD-10-CM

## 2018-11-24 DIAGNOSIS — G894 Chronic pain syndrome: Secondary | ICD-10-CM | POA: Diagnosis not present

## 2018-11-24 DIAGNOSIS — M5442 Lumbago with sciatica, left side: Secondary | ICD-10-CM | POA: Diagnosis not present

## 2018-11-24 DIAGNOSIS — R519 Headache, unspecified: Secondary | ICD-10-CM

## 2018-11-24 DIAGNOSIS — M542 Cervicalgia: Secondary | ICD-10-CM | POA: Diagnosis not present

## 2018-11-24 DIAGNOSIS — G8929 Other chronic pain: Secondary | ICD-10-CM

## 2018-11-24 DIAGNOSIS — M79605 Pain in left leg: Secondary | ICD-10-CM

## 2018-11-24 NOTE — Patient Instructions (Signed)

## 2018-12-01 ENCOUNTER — Ambulatory Visit: Payer: BC Managed Care – PPO | Admitting: Pain Medicine

## 2018-12-04 ENCOUNTER — Other Ambulatory Visit: Payer: Self-pay | Admitting: Pain Medicine

## 2018-12-04 DIAGNOSIS — M792 Neuralgia and neuritis, unspecified: Secondary | ICD-10-CM

## 2018-12-06 ENCOUNTER — Emergency Department
Admission: EM | Admit: 2018-12-06 | Discharge: 2018-12-06 | Disposition: A | Discharge status: Home / Self Care | Payer: Medicaid Other | Attending: Emergency Medicine | Admitting: Emergency Medicine

## 2018-12-06 ENCOUNTER — Emergency Department: Payer: Medicaid Other

## 2018-12-06 ENCOUNTER — Other Ambulatory Visit: Payer: Self-pay

## 2018-12-06 DIAGNOSIS — R55 Syncope and collapse: Secondary | ICD-10-CM

## 2018-12-06 DIAGNOSIS — M25572 Pain in left ankle and joints of left foot: Secondary | ICD-10-CM | POA: Diagnosis not present

## 2018-12-06 DIAGNOSIS — E039 Hypothyroidism, unspecified: Secondary | ICD-10-CM | POA: Insufficient documentation

## 2018-12-06 DIAGNOSIS — I252 Old myocardial infarction: Secondary | ICD-10-CM | POA: Diagnosis not present

## 2018-12-06 DIAGNOSIS — M25571 Pain in right ankle and joints of right foot: Secondary | ICD-10-CM | POA: Diagnosis not present

## 2018-12-06 DIAGNOSIS — Z79899 Other long term (current) drug therapy: Secondary | ICD-10-CM | POA: Insufficient documentation

## 2018-12-06 LAB — CBC
HCT: 43 % (ref 36.0–46.0)
Hemoglobin: 13.1 g/dL (ref 12.0–15.0)
MCH: 31.7 pg (ref 26.0–34.0)
MCHC: 30.5 g/dL (ref 30.0–36.0)
MCV: 104.1 fL — ABNORMAL HIGH (ref 80.0–100.0)
Platelets: 302 10*3/uL (ref 150–400)
RBC: 4.13 MIL/uL (ref 3.87–5.11)
RDW: 13 % (ref 11.5–15.5)
WBC: 14.3 10*3/uL — ABNORMAL HIGH (ref 4.0–10.5)
nRBC: 0 % (ref 0.0–0.2)

## 2018-12-06 LAB — BASIC METABOLIC PANEL
Anion gap: 10 (ref 5–15)
BUN: 9 mg/dL (ref 6–20)
CO2: 19 mmol/L — ABNORMAL LOW (ref 22–32)
Calcium: 8.1 mg/dL — ABNORMAL LOW (ref 8.9–10.3)
Chloride: 110 mmol/L (ref 98–111)
Creatinine, Ser: 0.99 mg/dL (ref 0.44–1.00)
GFR calc Af Amer: >60 mL/min (ref >60)
GFR calc non Af Amer: >60 mL/min (ref >60)
Glucose, Bld: 108 mg/dL — ABNORMAL HIGH (ref 70–99)
Potassium: 4.1 mmol/L (ref 3.5–5.1)
Sodium: 139 mmol/L (ref 135–145)

## 2018-12-06 LAB — TROPONIN I (HIGH SENSITIVITY): Troponin I (High Sensitivity): 8 ng/L (ref <18)

## 2018-12-06 MED ORDER — SODIUM CHLORIDE 0.9% FLUSH: 3.0000 mL | Freq: Once | INTRAVENOUS | Status: DC

## 2018-12-06 MED ORDER — NAPROXEN 500 MG PO TABS
500.0000 mg | ORAL_TABLET | Freq: Once | ORAL | Status: AC
  Administered 2018-12-06: 500 mg via ORAL
  Filled 2018-12-06: qty 1

## 2018-12-06 NOTE — ED Triage Notes (Signed)
States syncopal episode 1 hour ago. States has history of SVT and feels like she had an episode. States injured both ankles when she fell.

## 2018-12-06 NOTE — ED Provider Notes (Signed)
[facility] Emergency Department Provider Note   ____________________________________________    I have reviewed the triage vital signs and the nursing notes.   HISTORY  Chief Complaint Loss of Consciousness and Ankle Pain     HPI Diana Quinn is a 53 y.o. female who presents after a syncopal episode.  Patient reports over the last several months she has had multiple syncopal episodes.  She does have a history of SVT, she is not clear whether it may be related to that.  Notably she also started metoprolol 1 month ago and her cardiologist told her that if she would continue having syncopal episodes they may need to decrease the dose or try something different.  She denies chest pain nausea vomiting or diaphoresis.  She reports she was walking around her bed when she fell, she reports that she injured her ankles bilaterally, left greater than right currently both are painful.  She thinks that she is able to bear weight on them.  No other injuries reported.  Past Medical History:  Diagnosis Date  . Anemia   . Gastric bypass status for obesity   . H/O unstable angina   . Hypoglycemia   . MI (myocardial infarction) (HCC)   . Orthostatic hypotension   . Osteoporosis   . Sciatic nerve disease   . Seizures (HCC)   . Thyroid disease     Patient Active Problem List   Diagnosis Date Noted  . Seizure (HCC) 10/02/2018  . H/O gastric bypass 07/27/2018  . Iron deficiency anemia 07/27/2018  . Chronic abdominal pain 07/12/2018  . Intractable abdominal pain 07/12/2018  . Hypoglycemia after GI (gastrointestinal) surgery 06/29/2018  . Spondylosis without myelopathy or radiculopathy, lumbosacral region 04/27/2018  . Chronic low back pain (Bilateral) (L>R) w/o sciatica 04/27/2018  . 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 Chronic hip pain (Bilateral) 04/14/2018  . Chronic sacroiliac joint pain (Bilateral) 04/14/2018  . Lumbar facet syndrome (Bilateral)  (L>R) 04/14/2018  . Neurogenic pain 04/14/2018  . Chronic musculoskeletal pain 04/14/2018  . Chronic lumbar radiculitis (S1) (Bilateral) 04/14/2018  . Osteoarthritis of knees (Bilateral) 04/13/2018  . Tricompartment osteoarthritis of knee (Right) 04/13/2018  . Chronic low back pain (Primary Area of Pain) (Bilateral) (L>R) w/ sciatica 03/31/2018  . Chronic lower extremity pain (Secondary Area of Pain) (Bilateral) (L>R) 03/31/2018  . Chronic neck pain [facility] Area of Pain) (Bilateral) (L>R) 03/31/2018  . Occipital headache (Fourth Area of Pain) (Bilateral) (L>R) 03/31/2018  . Pharmacologic therapy 03/31/2018  . Disorder of skeletal system 03/31/2018  . Problems influencing health status 03/31/2018  . Chronic knee pain (Bilateral) (L>R) 03/31/2018  . Chronic pain syndrome 03/31/2018  . DDD (degenerative disc disease), lumbar 02/08/2018  . Other and unspecified hyperlipidemia 02/08/2018  . Lumbar spondylosis 02/08/2018  . Vitamin D deficiency 02/08/2018  . Angina pectoris (HCC) 01/26/2018  . Hypotension 01/26/2018  . Impaired glucose tolerance 01/26/2018  . Other B-complex deficiencies 01/26/2018  . Schizophrenia (HCC) 01/26/2018  . Epilepsy (HCC) 07/10/2011  . Bariatric surgery status 03/24/2011  . Bipolar disorder (HCC) 03/24/2011  . Generalized anxiety disorder 03/24/2011  . Hypothyroidism 03/24/2011  . Migraine headache 03/24/2011  . Sleep disturbances 03/24/2011    Past Surgical History:  Procedure Laterality Date  . ABDOMINAL ADHESION SURGERY    . ABDOMINAL HYSTERECTOMY    . CHOLECYSTECTOMY    . DILATION AND CURETTAGE OF UTERUS    . ESOPHAGOGASTRODUODENOSCOPY    . GASTRIC BYPASS    . GASTRIC BYPASS OPEN  revision  . GASTROSTOMY W/ FEEDING TUBE    . HERNIA REPAIR    . OOPHORECTOMY    . SPLENECTOMY    . TONSILLECTOMY      Prior to Admission medications   Medication Sig Start Date End Date Taking? Authorizing Provider  acarbose (PRECOSE) 100 MG tablet Take 100  mg by mouth 3 (three) times daily. With meals 06/29/18   [provider]  alendronate (FOSAMAX) 70 MG tablet Take 70 mg by mouth once a week. 02/19/18   [provider]  ARIPiprazole ER (ABILIFY MAINTENA) 400 MG PRSY prefilled syringe Inject 400 mg into the muscle every 30 (thirty) days. 01/26/18   [provider]  butalbital-acetaminophen-caffeine (FIORICET) 50-325-40 MG tablet Take 1 tablet by mouth every 4 (four) hours as needed for pain.    [provider]  Calcium Carb-Cholecalciferol (CALTRATE 600+D3) 600-800 MG-UNIT TABS Take 1 tablet by mouth 2 (two) times daily. 01/26/18   [provider]  cetirizine (ZYRTEC) 10 MG tablet Take 10 mg by mouth daily. 04/29/18   [provider]  cyanocobalamin (,VITAMIN B-12,) 1000 MCG/ML injection Inject 1,000 mcg into the muscle every 30 (thirty) days. 02/25/18   [provider]  ergocalciferol (VITAMIN D2) 1.25 MG (50000 UT) capsule Take 1.25 mcg by mouth once a week.  02/08/18   [provider]  escitalopram (LEXAPRO) 5 MG tablet TK 1 T PO QAM 10/07/18   [provider]  FYAVOLV 0.5-2.5 MG-MCG tablet Take 1 tablet by mouth daily. 02/19/18   [provider]  gabapentin (NEURONTIN) 400 MG capsule Take 1 capsule (400 mg total) by mouth 4 (four) times daily. 10/11/18 01/09/19  Delano MetzNaveira, Francisco, MD  Galcanezumab-gnlm 120 MG/ML SOAJ Inject 120 mg into the skin every 30 (thirty) days. 06/28/18   [provider]  HYDROcodone-acetaminophen (NORCO/VICODIN) 5-325 MG tablet Take 1 tablet by mouth every 8 (eight) hours as needed for severe pain. Must last 30 days 10/11/18 11/10/18  Delano MetzNaveira, Francisco, MD  HYDROcodone-acetaminophen (NORCO/VICODIN) 5-325 MG tablet Take 1 tablet by mouth every 8 (eight) hours as needed for severe pain. Must last 30 days 11/10/18 12/10/18  Delano MetzNaveira, Francisco, MD  HYDROcodone-acetaminophen (NORCO/VICODIN) 5-325 MG tablet Take 1 tablet by mouth every 8 (eight)  hours as needed for severe pain. Must last 30 days 12/10/18 01/09/19  Delano MetzNaveira, Francisco, MD  isosorbide dinitrate (ISORDIL) 30 MG tablet Take 30 mg by mouth daily.    [provider]  levothyroxine (LEVOXYL) 88 MCG tablet Take 88 mcg by mouth daily. 01/26/18   [provider]  linaclotide (LINZESS) 145 MCG CAPS capsule Take 145 mcg by mouth daily before breakfast.    [provider]  nitroGLYCERIN (NITROSTAT) 0.4 MG SL tablet Place 0.4 mg under the tongue as needed. 01/26/18   [provider]  ondansetron (ZOFRAN) 8 MG tablet Take 8 mg by mouth as needed. 01/26/18   [provider]  OXcarbazepine (TRILEPTAL) 300 MG/5ML suspension Take 450 mg by mouth 2 (two) times daily.    [provider]  pantoprazole (PROTONIX) 40 MG tablet Take 40 mg by mouth daily.    [provider]  polyethylene glycol (MIRALAX / GLYCOLAX) 17 g packet Take 17 g by mouth 2 (two) times daily. Patient taking differently: Take 17 g by mouth daily as needed.  07/16/18   Altamese DillingVachhani, Vaibhavkumar, MD  ranolazine (RANEXA) 500 MG 12 hr tablet Take 500 mg by mouth 2 (two) times daily.    [provider]  rosuvastatin (CRESTOR)  5 MG tablet Take 10 mg by mouth daily.  02/08/18   [provider]  tiZANidine (ZANAFLEX) 4 MG tablet Take 1 tablet (4 mg total) by mouth 3 (three) times daily. 10/11/18 01/09/19  Delano Metz, MD  traZODone (DESYREL) 100 MG tablet Take 100 mg by mouth at bedtime.    [provider]  zolpidem (AMBIEN) 5 MG tablet Take 5 mg by mouth at bedtime. 07/09/18 11/22/18  [provider]     Allergies Depakote [divalproex sodium], Pregabalin, and Levetiracetam  Family History  Problem Relation Age of Onset  . Cancer Mother   . Hypertension Mother   . COPD Mother   . Asthma Sister   . Psoriasis Sister   . Epilepsy Brother     Social History Social History   Tobacco Use  . Smoking status: Never Smoker  . Smokeless  tobacco: Never Used  Substance Use Topics  . Alcohol use: Not Currently  . Drug use: Never    Review of Systems  Constitutional: No fever/chills Eyes: No visual changes.  ENT: No neck pain Cardiovascular: Denies chest pain. Respiratory: Denies shortness of breath. Gastrointestinal: No abdominal pain.  No nausea, no vomiting.   Genitourinary: Negative for dysuria. Musculoskeletal: No back pain, ankle pain as above Skin: Negative for rash. Neurological: Negative for headaches or weakness   ____________________________________________   PHYSICAL EXAM:  VITAL SIGNS: ED Triage Vitals  Enc Vitals Group     BP 12/06/18 1403 (!) 100/48     Pulse Rate 12/06/18 1403 62     Resp 12/06/18 1403 18     Temp 12/06/18 1403 98.6 F (37 C)     Temp Source 12/06/18 1403 Oral     SpO2 12/06/18 1403 98 %     Weight 12/06/18 1405 88.5 kg (195 lb)     Height 12/06/18 1405 1.6 m ( )     Head Circumference --      Peak Flow --      Pain Score 12/06/18 1405 7     Pain Loc --      Pain Edu? --      Excl. in GC? --     Constitutional: Alert and oriented. No acute distress. Pleasant and interactive  Nose: No congestion/rhinnorhea. Mouth/Throat: Mucous membranes are moist.   Neck:  Painless ROM Cardiovascular: Normal rate, regular rhythm. Grossly normal heart sounds.  Good peripheral circulation. Respiratory: Normal respiratory effort.  No retractions. Lungs CTAB. Gastrointestinal: Soft and nontender. No distention.  No CVA tenderness.  Musculoskeletal: Mild swelling to the lateral right malleolus of the right ankle, left ankle exam is overall reassuring, no significant swelling.  Back: No vertebral tenderness palpation, Neurologic:  Normal speech and language. No gross focal neurologic deficits are appreciated.  Skin:  Skin is warm, dry and intact. No rash noted. Psychiatric: Mood and affect are normal. Speech and behavior are normal.  ____________________________________________    LABS (all labs ordered are listed, but only abnormal results are displayed)  Labs Reviewed  BASIC METABOLIC PANEL - Abnormal; Notable for the following components:      Result Value   CO2 19 (*)    Glucose, Bld 108 (*)    Calcium 8.1 (*)    All other components within normal limits  CBC - Abnormal; Notable for the following components:   WBC 14.3 (*)    MCV 104.1 (*)    All other components within normal limits  TROPONIN I (HIGH SENSITIVITY)   ____________________________________________  EKG  ED ECG REPORT I, Lavonia Drafts, the attending physician, personally viewed and interpreted this ECG.  Date: 12/06/2018  Rhythm: normal sinus rhythm QRS Axis: normal Intervals: normal ST/T Wave abnormalities: normal Narrative Interpretation: no evidence of acute ischemia  ____________________________________________  RADIOLOGY  X-ray left ankle unremarkable, right ankle, possible small fragment ____________________________________________   PROCEDURES  Procedure(s) performed: No  Procedures   Critical Care performed: No ____________________________________________   INITIAL IMPRESSION / ASSESSMENT AND PLAN / ED COURSE  Pertinent labs & imaging results that were available during my care of the patient were reviewed by me and considered in my medical decision making (see chart for details).  Patient well-appearing in no acute distress.  No chest pain or palpitations to suggest arrhythmia or ACS.  Lab work is overall quite reassuring, vital signs unremarkable.  I have asked her to follow-up closely with her cardiologist but to continue her metoprolol for now .patient's ankles is splinted, crutches provided, she has help at home    ____________________________________________   FINAL CLINICAL IMPRESSION(S) / ED DIAGNOSES  Final diagnoses:  Acute right ankle pain  Acute left ankle pain  Syncope and collapse        Note:  This document was prepared using Dragon  voice recognition software and may include unintentional dictation errors.   Lavonia Drafts, MD 12/06/18 2152

## 2018-12-06 NOTE — ED Notes (Signed)
Attempted butterfly stick without success multiple times for repeat troponin.  Called lab who will draw sample.

## 2018-12-06 NOTE — ED Notes (Signed)
Lab at bedside to draw repeat troponin.

## 2018-12-06 NOTE — ED Notes (Signed)
Called lab to send someone for recollect.  Lab to send someone at this time.

## 2018-12-16 DIAGNOSIS — K283 Acute gastrojejunal ulcer without hemorrhage or perforation: Secondary | ICD-10-CM | POA: Insufficient documentation

## 2018-12-21 ENCOUNTER — Encounter: Payer: Self-pay | Admitting: Pain Medicine

## 2018-12-22 ENCOUNTER — Ambulatory Visit: Payer: Medicaid Other | Attending: Pain Medicine | Admitting: Pain Medicine

## 2018-12-22 ENCOUNTER — Inpatient Hospital Stay: Payer: Medicaid Other | Attending: Oncology

## 2018-12-22 ENCOUNTER — Other Ambulatory Visit: Payer: Self-pay

## 2018-12-22 DIAGNOSIS — M79604 Pain in right leg: Secondary | ICD-10-CM | POA: Diagnosis not present

## 2018-12-22 DIAGNOSIS — M7918 Myalgia, other site: Secondary | ICD-10-CM

## 2018-12-22 DIAGNOSIS — M542 Cervicalgia: Secondary | ICD-10-CM

## 2018-12-22 DIAGNOSIS — G8929 Other chronic pain: Secondary | ICD-10-CM

## 2018-12-22 DIAGNOSIS — M5441 Lumbago with sciatica, right side: Secondary | ICD-10-CM

## 2018-12-22 DIAGNOSIS — M79605 Pain in left leg: Secondary | ICD-10-CM

## 2018-12-22 DIAGNOSIS — M5442 Lumbago with sciatica, left side: Secondary | ICD-10-CM

## 2018-12-22 DIAGNOSIS — G894 Chronic pain syndrome: Secondary | ICD-10-CM | POA: Diagnosis not present

## 2018-12-22 DIAGNOSIS — M792 Neuralgia and neuritis, unspecified: Secondary | ICD-10-CM

## 2018-12-22 MED ORDER — GABAPENTIN 400 MG PO CAPS: 400.0000 mg | ORAL_CAPSULE | Freq: Four times a day (QID) | ORAL | 5 refills | Status: DC

## 2018-12-22 MED ORDER — TIZANIDINE HCL 4 MG PO TABS: 4.0000 mg | ORAL_TABLET | Freq: Three times a day (TID) | ORAL | 5 refills | Status: DC

## 2018-12-22 MED ORDER — HYDROCODONE-ACETAMINOPHEN 5-325 MG PO TABS: 1.0000 | ORAL_TABLET | Freq: Three times a day (TID) | ORAL | 0 refills | Status: DC | PRN

## 2018-12-22 NOTE — Progress Notes (Signed)
Pain Management Virtual Encounter Note - Virtual Visit via Telephone Telehealth (real-time audio visits between healthcare provider and patient).   Patient's Phone No. & Preferred Pharmacy:  [phone] (home); [phone] (mobile); (Preferred) [phone] [email or web address]  [facility][id number] Nicholes Rough- [place], [place] - [address] [facility] [address] [place] [id number] Phone: [phone] Fax: [phone]    Pre-screening note:  Our staff contacted Diana Quinn and offered her an "in person", "face-to-face" appointment versus a telephone encounter. She indicated preferring the telephone encounter, at this time.   Reason for Virtual Visit: COVID-19*  Social distancing based on CDC and AMA recommendations.   I contacted Diana Dayicole Zinn on 12/22/2018 via telephone.      I clearly identified myself as Oswaldo DoneFrancisco A Mahealani Sulak, MD. I verified that I was speaking with the correct person using two identifiers (Name: Diana Quinn, and date of birth: 1965/12/20).  Advanced Informed Consent I sought verbal advanced consent from Diana DayNicole Lister for virtual visit interactions. I informed Diana Quinn of possible security and privacy concerns, risks, and limitations associated with providing "not-in-person" medical evaluation and management services. I also informed Diana Quinn of the availability of "in-person" appointments. Finally, I informed her that there would be a charge for the virtual visit and that she could be  personally, fully or partially, financially responsible for it. Diana Quinn expressed understanding and agreed to proceed.   Historic Elements   Ms. Diana Dayicole Near is a 53 y.o. year old, female patient evaluated today after her last encounter by our practice on 12/04/2018. Diana Quinn  has a past medical history of Anemia, Gastric bypass status for obesity, H/O unstable angina, Hypoglycemia, MI (myocardial infarction) (HCC), Orthostatic  hypotension, Osteoporosis, Sciatic nerve disease, Seizures (HCC), and Thyroid disease. She also  has a past surgical history that includes Gastric bypass; Abdominal hysterectomy; Cholecystectomy; Tonsillectomy; Gastric bypass open; Oophorectomy; Hernia repair; Abdominal adhesion surgery; Gastrostomy w/ feeding tube; Dilation and curettage of uterus; Splenectomy; and Esophagogastroduodenoscopy. Diana Quinn has a current medication list which includes the following prescription(s): acarbose, alendronate, aripiprazole er, butalbital-acetaminophen-caffeine, calcium carb-cholecalciferol, cetirizine, cyanocobalamin, duloxetine, ergocalciferol, escitalopram, fyavolv, gabapentin, galcanezumab-gnlm, hydrocodone-acetaminophen, hydrocodone-acetaminophen, hydrocodone-acetaminophen, isosorbide dinitrate, levothyroxine, linaclotide, metoprolol succinate, nitroglycerin, ondansetron, oxcarbazepine, pantoprazole, phentermine, polyethylene glycol, ranolazine, rosuvastatin, tizanidine, topiramate, torsemide, trazodone, and zolpidem. She  reports that she has never smoked. She has never used smokeless tobacco. She reports previous alcohol use. She reports that she does not use drugs. Diana Quinn is allergic to depakote [divalproex sodium]; pregabalin; and levetiracetam.   HPI  Today, she is being contacted for medication management.  The patient indicates doing well with the current medication regimen. No adverse reactions or side effects reported to the medications.   Pharmacotherapy Assessment  Analgesic: Hydrocodone/APAP 5/325, 1 tab PO q 8 hrs (15 mg/day of hydrocodone) MME/day:15 mg/day.   Monitoring: Pharmacotherapy: No side-effects or adverse reactions reported. [place] PMP: PDMP reviewed during this encounter.       Compliance: No problems identified. Effectiveness: Clinically acceptable. Plan: Refer to "POC".  UDS:  Summary  Date Value Ref Range Status  03/31/2018 FINAL  Final    Comment:     ==================================================================== [facility] COMP DRUG ANALYSIS,UR ==================================================================== Test                             Result       Flag       Units Drug Present and Declared for Prescription  Verification   7-aminoclonazepam              84           EXPECTED   ng/mg creat    7-aminoclonazepam is an expected metabolite of clonazepam. Source    of clonazepam is a scheduled prescription medication.   Butalbital                     PRESENT      EXPECTED   Gabapentin                     PRESENT      EXPECTED   Oxcarbazepine MHD              PRESENT      EXPECTED    Oxcarbazepine MHD is the active metabolite of oxcarbazepine and    eslicarbazepine.   Quetiapine                     PRESENT      EXPECTED   Aripiprazole                   PRESENT      EXPECTED   Acetaminophen                  PRESENT      EXPECTED Drug Present not Declared for Prescription Verification   Baclofen                       PRESENT      UNEXPECTED   Ibuprofen                      PRESENT      UNEXPECTED Drug Absent but Declared for Prescription Verification   Oxycodone                      Not Detected UNEXPECTED ng/mg creat   Tizanidine                     Not Detected UNEXPECTED    Tizanidine, as indicated in the declared medication list, is not    always detected even when used as directed.   Salicylate                     Not Detected UNEXPECTED    Aspirin, as indicated in the declared medication list, is not    always detected even when used as directed. ==================================================================== Test                      Result    Flag   Units      Ref Range   Creatinine              102              mg/dL      >=23 ==================================================================== Declared Medications:  The flagging and interpretation on this report are based on the  following declared medications.   Unexpected results may arise from  inaccuracies in the declared medications.  **Note: The testing scope of this panel includes these medications:  Aripiprazole (Abilify)  Butalbital (Butalbital/APAP/Caffeine)  Clonazepam (Klonopin)  Gabapentin (Neurontin)  Oxcarbazepine (Trileptal)  Oxycodone (Percocet)  Quetiapine (Seroquel)  **Note: The testing scope of this panel does not include small to  moderate amounts of these  reported medications:  Acetaminophen (Butalbital/APAP/Caffeine)  Acetaminophen (Percocet)  Aspirin  Tizanidine (Zanaflex)  **Note: The testing scope of this panel does not include following  reported medications:  Alendronate (Fosamax)  Caffeine (Butalbital/APAP/Caffeine)  Calcium (Calcium/Cholecalciferol)  Cholecalciferol (Calcium/Cholecalciferol)  Lactulose (Constulose)  Levothyroxine  Nitroglycerin (Nitrostat)  Ondansetron (Zofran)  Prednisone (Sterapred)  Rosuvastatin (Crestor)  Sumatriptan (Imitrex)  Vitamin B12  Vitamin D2 ==================================================================== For clinical consultation, please call [phone]. ====================================================================    Laboratory Chemistry Profile (12 mo)  Renal: 03/31/2018: BUN/Creatinine Ratio 9 12/06/2018: BUN 9; Creatinine, Ser 0.99  Lab Results  Component Value Date   GFRAA >60 12/06/2018   GFRNONAA >60 12/06/2018   Hepatic: 09/24/2018: Albumin 4.2 Lab Results  Component Value Date   AST 27 09/24/2018   ALT 28 09/24/2018   Other: 03/31/2018: 25-Hydroxy, Vitamin D 30; 25-Hydroxy, Vitamin D-2 25; 25-Hydroxy, Vitamin D-3 4.8; CRP <1; Sed Rate 21 08/05/2018: Vitamin B-12 303 Note: Above Lab results reviewed.  Imaging  Last 90 days:  Dg Chest 2 View  Result Date: 12/06/2018 CLINICAL DATA:  Syncopal episode 1 hour ago with fall, possible SVT. EXAM: CHEST - 2 VIEW COMPARISON:  04/25/2018 FINDINGS: Lungs are adequately inflated and otherwise clear.  Cardiomediastinal silhouette is within normal. Minimal degenerative change of the spine. Subtle anterior wedging of a lower thoracic vertebral body unchanged. Multiple surgical clips over the upper abdomen/epigastric region and gastroesophageal junction. Remainder of the exam is unchanged. IMPRESSION: No acute cardiopulmonary disease. Electronically Signed   By: Marin Olp M.D.   On: 12/06/2018 14:51   Dg Ankle Complete Left  Result Date: 12/06/2018 CLINICAL DATA:  Syncopal episode with fall injuring ankles. EXAM: LEFT ANKLE COMPLETE - 3+ VIEW COMPARISON:  None. FINDINGS: Ankle mortise is normal. There is no evidence of acute fracture or dislocation. Small inferior calcaneal spur. IMPRESSION: No acute fracture. Electronically Signed   By: Marin Olp M.D.   On: 12/06/2018 14:52   Dg Ankle Complete Right  Result Date: 12/06/2018 CLINICAL DATA:  Syncopal episode with fall injuring both ankles. EXAM: RIGHT ANKLE - COMPLETE 3+ VIEW COMPARISON:  None. FINDINGS: Mild soft tissue swelling over the lateral ankle. Ankle mortise is normal. Linear fragment dorsal to the distal talus which may be acute or chronic. Old injury to the distal tip of the fibula. Small inferior calcaneal spur. IMPRESSION: Small fragment dorsal to the distal talus which may be acute or chronic. Electronically Signed   By: Marin Olp M.D.   On: 12/06/2018 14:54   Ct Abdomen Pelvis W Contrast  Result Date: 09/24/2018 CLINICAL DATA:  History of gastric ulcer, epigastric pain EXAM: CT ABDOMEN AND PELVIS WITH CONTRAST TECHNIQUE: Multidetector CT imaging of the abdomen and pelvis was performed using the standard protocol following bolus administration of intravenous contrast. CONTRAST:  136mL OMNIPAQUE IOHEXOL 300 MG/ML  SOLN COMPARISON:  CT 07/11/2018 FINDINGS: Lower chest: Lung bases demonstrate no acute consolidation or effusion. Mild cardiomegaly. Small hiatal hernia. Hepatobiliary: No focal liver abnormality is seen. Status post  cholecystectomy. No biliary dilatation. Pancreas: Slight indistinct/edematous appearance at the head of pancreas and duodenal bulb. No ductal dilatation Spleen: Status post splenectomy. Splenules in the left upper quadrant. Adrenals/Urinary Tract: Adrenal glands are normal. Kidneys show no hydronephrosis. The bladder is normal. Stomach/Bowel: Status post gastric bypass. No dilated small bowel. No colon wall thickening. Prominent appendix measuring up to 8 mm in size but no significant surrounding inflammation. Vascular/Lymphatic: Nonaneurysmal aorta. No significantly enlarged lymph nodes Reproductive: Status post hysterectomy. No adnexal masses. Other:  Negative for free air or free fluid Musculoskeletal: No acute or suspicious osseous abnormality IMPRESSION: 1. Slightly indistinct appearance at the pancreaticoduodenal groove, possibly representing mild pancreatitis or duodenitis. Suggest correlation with enzymes. 2. Status post gastric bypass surgery.  No bowel obstruction 3. Prominent appearing appendix up to 8 mm but no surrounding inflammation Electronically Signed   By: Jasmine Pang M.D.   On: 09/24/2018 03:47   Dg Pain Clinic C-arm 1-60 Min No Report  Result Date: 10/26/2018 Fluoro was used, but no Radiologist interpretation will be provided. Please refer to "NOTES" tab for provider progress note.   Assessment  The primary encounter diagnosis was Chronic pain syndrome. Diagnoses of Chronic low back pain (Primary Area of Pain) (Bilateral) (L>R) w/ sciatica, Chronic lower extremity pain (Secondary Area of Pain) (Bilateral) (L>R), Chronic neck pain (Tertiary Area of Pain) (Bilateral) (L>R), Chronic musculoskeletal pain, and Neurogenic pain were also pertinent to this visit.  Plan of Care  I have discontinued Taniesha Esteve's isosorbide mononitrate. I am also having her start on HYDROcodone-acetaminophen and HYDROcodone-acetaminophen. Additionally, I am having her maintain her alendronate, ARIPiprazole  ER, Calcium Carb-Cholecalciferol, cyanocobalamin, ergocalciferol, levothyroxine, nitroGLYCERIN, Fyavolv, ondansetron, rosuvastatin, ranolazine, isosorbide dinitrate, cetirizine, acarbose, butalbital-acetaminophen-caffeine, Galcanezumab-gnlm, zolpidem, polyethylene glycol, linaclotide, pantoprazole, traZODone, OXcarbazepine, escitalopram, DULoxetine, metoprolol succinate, phentermine, torsemide, topiramate, tiZANidine, HYDROcodone-acetaminophen, and gabapentin.  Pharmacotherapy (Medications Ordered): Meds ordered this encounter  Medications  . tiZANidine (ZANAFLEX) 4 MG tablet    Sig: Take 1 tablet (4 mg total) by mouth 3 (three) times daily.    Dispense:  90 tablet    Refill:  5    Fill one day early if pharmacy is closed on scheduled refill date. May substitute for generic if available.  Marland Kitchen HYDROcodone-acetaminophen (NORCO/VICODIN) 5-325 MG tablet    Sig: Take 1 tablet by mouth every 8 (eight) hours as needed for severe pain. Must last 30 days    Dispense:  90 tablet    Refill:  0    Chronic Pain: STOP Act (Not applicable) Fill 1 day early if closed on refill date. Do not fill until: 01/09/2019. To last until: 02/08/2019. Avoid benzodiazepines within 8 hours of opioids  . gabapentin (NEURONTIN) 400 MG capsule    Sig: Take 1 capsule (400 mg total) by mouth 4 (four) times daily.    Dispense:  120 capsule    Refill:  5    Fill one day early if pharmacy is closed on scheduled refill date. May substitute for generic if available.  Marland Kitchen HYDROcodone-acetaminophen (NORCO/VICODIN) 5-325 MG tablet    Sig: Take 1 tablet by mouth every 8 (eight) hours as needed for severe pain. Must last 30 days    Dispense:  90 tablet    Refill:  0    Chronic Pain: STOP Act (Not applicable) Fill 1 day early if closed on refill date. Do not fill until: 02/08/2019. To last until: 03/10/2019. Avoid benzodiazepines within 8 hours of opioids  . HYDROcodone-acetaminophen (NORCO/VICODIN) 5-325 MG tablet    Sig: Take 1 tablet by  mouth every 8 (eight) hours as needed for severe pain. Must last 30 days    Dispense:  90 tablet    Refill:  0    Chronic Pain: STOP Act (Not applicable) Fill 1 day early if closed on refill date. Do not fill until: 03/10/2019. To last until: 04/09/2019. Avoid benzodiazepines within 8 hours of opioids   Orders:  No orders of the defined types were placed in this encounter.  Follow-up plan:   Return  in about 15 weeks (around 04/06/2019) for (VV), (MM).      Interventional management options:  Considering:   Diagnostic left L4-5 LESI  Diagnostic midline caudal ESI + epidurogram  Possible bilateral lumbar facetRFA Diagnostic bilateral occipital nerve block Possible bilateral occipital nerveRFA Diagnostic bilateral cervical facet block Possible bilateral cervical facetRFA Diagnostic left-sided sacroiliac joint injection Possible left-sided sacroiliac jointRFA   Palliative PRN treatment(s):   Diagnostic bilateral lumbar facet block #2       Recent Visits Date Type Provider Dept  11/24/18 Office Visit Delano Metz, MD [facility]  10/26/18 Procedure visit Delano Metz, MD [facility]  10/11/18 Office Visit Delano Metz, MD [facility]  Showing recent visits within past 90 days and meeting all other requirements   Today's Visits Date Type Provider Dept  12/22/18 Office Visit Delano Metz, MD [facility]  Showing today's visits and meeting all other requirements   Future Appointments Date Type Provider Dept  01/04/19 Appointment Delano Metz, MD [facility]  Showing future appointments within next 90 days and meeting all other requirements   I discussed the assessment and treatment plan with the patient. The patient was provided an opportunity to ask questions and all were answered. The patient agreed with the plan and demonstrated an understanding of the instructions.  Patient advised to  call back or seek an in-person evaluation if the symptoms or condition worsens.  Total duration of non-face-to-face encounter: 12 minutes.  Note by: Oswaldo Done, MD Date: 12/22/2018; Time: 12:19 PM  Note: This dictation was prepared with Dragon dictation. Any transcriptional errors that may result from this process are unintentional.  Disclaimer:  * Given the special circumstances of the COVID-19 pandemic, the federal government has announced that the Office for Civil Rights (OCR) will exercise its enforcement discretion and will not impose penalties on physicians using telehealth in the event of noncompliance with regulatory requirements under the [employer] Portability and Accountability Act (HIPAA) in connection with the good faith provision of telehealth during the COVID-19 national public health emergency. (AMA)

## 2019-01-04 ENCOUNTER — Ambulatory Visit: Payer: Medicaid Other | Admitting: Pain Medicine

## 2019-01-13 ENCOUNTER — Ambulatory Visit: Payer: Medicaid Other | Admitting: Pain Medicine

## 2019-02-23 ENCOUNTER — Other Ambulatory Visit: Payer: Self-pay | Admitting: Neurology

## 2019-02-23 DIAGNOSIS — R531 Weakness: Secondary | ICD-10-CM

## 2019-03-08 ENCOUNTER — Ambulatory Visit: Payer: Medicaid Other

## 2019-03-21 ENCOUNTER — Ambulatory Visit
Admission: RE | Admit: 2019-03-21 | Discharge: 2019-03-21 | Disposition: A | Discharge status: Home / Self Care | Payer: Medicaid Other | Source: Ambulatory Visit | Attending: Neurology | Admitting: Neurology

## 2019-03-21 ENCOUNTER — Other Ambulatory Visit: Payer: Self-pay

## 2019-03-21 DIAGNOSIS — R531 Weakness: Secondary | ICD-10-CM | POA: Diagnosis present

## 2019-03-28 ENCOUNTER — Telehealth: Payer: Self-pay | Admitting: *Deleted

## 2019-03-28 NOTE — Telephone Encounter (Signed)
I called [facility], there is a script for Hydrocodone that can be filled. Patient notified.

## 2019-03-29 ENCOUNTER — Inpatient Hospital Stay: Payer: Medicaid Other

## 2019-03-29 ENCOUNTER — Inpatient Hospital Stay: Payer: Medicaid Other | Admitting: Oncology

## 2019-04-04 ENCOUNTER — Inpatient Hospital Stay: Payer: Medicaid Other | Attending: Oncology

## 2019-04-05 ENCOUNTER — Other Ambulatory Visit: Payer: Self-pay

## 2019-04-05 ENCOUNTER — Inpatient Hospital Stay: Payer: Medicaid Other | Admitting: Oncology

## 2019-04-05 ENCOUNTER — Telehealth: Payer: Self-pay | Admitting: Oncology

## 2019-04-05 ENCOUNTER — Encounter: Payer: Self-pay | Admitting: Oncology

## 2019-04-05 ENCOUNTER — Encounter: Payer: Self-pay | Admitting: Pain Medicine

## 2019-04-05 NOTE — Progress Notes (Signed)
Patient: Diana Quinn  Service Category: E/M  Provider: Oswaldo Done, MD  DOB: [birth year]  DOS: 04/06/2019  Location: Office  MRN: [id number]  Setting: Ambulatory outpatient  Referring Provider: Fayrene Helper, NP  Type: Established Patient  Specialty: Interventional Pain Management  PCP: Fayrene Helper, NP  Location: Remote location  Delivery: TeleHealth     Virtual Encounter - Pain Management PROVIDER NOTE: Information contained herein reflects review and annotations entered in association with encounter. Interpretation of such information and data should be left to medically-trained personnel. Information provided to patient can be located elsewhere in the medical record under "Patient Instructions". Document created using STT-dictation technology, any transcriptional errors that may result from process are unintentional.    Contact & Pharmacy Preferred: [phone] Home: [phone] (home) Mobile: [phone] (mobile) E-mail: [id number] [place], [place] - [address] AT [facility] [address] [place] [address] Phone: [phone] Fax: [phone]   Pre-screening  Diana Quinn offered "in-person" vs "virtual" encounter. She indicated preferring virtual for this encounter.   Reason COVID-19*  Social distancing based on CDC and AMA recommendations.   I contacted Diana Quinn on 04/06/2019 via telephone.      I clearly identified myself as Oswaldo Done, MD. I verified that I was speaking with the correct person using two identifiers (Name: Chrishelle Zito, and date of birth: 17-Jul-1965).  Consent I sought verbal advanced consent from Susa Day for virtual visit interactions. I informed Diana Quinn of possible security and privacy concerns, risks, and limitations associated with providing "not-in-person" medical evaluation and management services. I also informed Diana Quinn of the  availability of "in-person" appointments. Finally, I informed her that there would be a charge for the virtual visit and that she could be  personally, fully or partially, financially responsible for it. Diana Quinn expressed understanding and agreed to proceed.   Historic Elements   Diana Quinn is a 54 y.o. year old, female patient evaluated today after her last encounter by our practice on 03/28/2019. Diana Quinn  has a past medical history of Anemia, Gastric bypass status for obesity, H/O unstable angina, Hypoglycemia, MI (myocardial infarction) (HCC), Orthostatic hypotension, Osteoporosis, Sciatic nerve disease, Seizures (HCC), and Thyroid disease. She also  has a past surgical history that includes Gastric bypass; Abdominal hysterectomy; Cholecystectomy; Tonsillectomy; Gastric bypass open; Oophorectomy; Hernia repair; Abdominal adhesion surgery; Gastrostomy w/ feeding tube; Dilation and curettage of uterus; Splenectomy; and Esophagogastroduodenoscopy. Ms. Campise has a current medication list which includes the following prescription(s): acarbose, alendronate, butalbital-acetaminophen-caffeine, calcium carb-cholecalciferol, cetirizine, cyanocobalamin, ergocalciferol, fyavolv, gabapentin, [START ON 07/08/2019] gabapentin, galcanezumab-gnlm, [START ON 04/09/2019] hydrocodone-acetaminophen, [START ON 05/09/2019] hydrocodone-acetaminophen, [START ON 06/08/2019] hydrocodone-acetaminophen, isosorbide dinitrate, levothyroxine, linaclotide, metoprolol succinate, nitroglycerin, ondansetron, oxcarbazepine, pantoprazole, phentermine, polyethylene glycol, quetiapine, ranolazine, rosuvastatin, tizanidine, [START ON 07/08/2019] tizanidine, topiramate, torsemide, trazodone, and zolpidem. She  reports that she has never smoked. She has never used smokeless tobacco. She reports previous alcohol use. She reports that she does not use drugs. Diana Quinn is allergic to depakote [divalproex sodium]; pregabalin; and levetiracetam.    HPI  Today, she is being contacted for medication management. The patient indicates doing well with the current medication regimen. No adverse reactions or side effects reported to the medications.  The patient indicates that lately she has been having a lot more pain in the knees.  The worst seems to be the right one.  She has x-rays of the knees that  demonstrate osteoarthritis.  We will go ahead and schedule her to come in for bilateral intra-articular knee joint injections with local anesthetic and steroid.  If this helps, but the pain returns, then we will think about the possibility of a Hyalgan series.  Pharmacotherapy Assessment  Analgesic: Hydrocodone/APAP 5/325, 1 tab PO q 8 hrs (15 mg/day of hydrocodone) MME/day:15 mg/day.   Monitoring: Pharmacotherapy: No side-effects or adverse reactions reported. [place] PMP: PDMP reviewed during this encounter.       Compliance: No problems identified. Effectiveness: Clinically acceptable. Plan: Refer to "POC".  UDS:  Summary  Date Value Ref Range Status  03/31/2018 FINAL  Final    Comment:    ==================================================================== [facility] COMP DRUG ANALYSIS,UR ==================================================================== Test                             Result       Flag       Units Drug Present and Declared for Prescription Verification   7-aminoclonazepam              84           EXPECTED   ng/mg creat    7-aminoclonazepam is an expected metabolite of clonazepam. Source    of clonazepam is a scheduled prescription medication.   Butalbital                     PRESENT      EXPECTED   Gabapentin                     PRESENT      EXPECTED   Oxcarbazepine MHD              PRESENT      EXPECTED    Oxcarbazepine MHD is the active metabolite of oxcarbazepine and    eslicarbazepine.   Quetiapine                     PRESENT      EXPECTED   Aripiprazole                   PRESENT      EXPECTED    Acetaminophen                  PRESENT      EXPECTED Drug Present not Declared for Prescription Verification   Baclofen                       PRESENT      UNEXPECTED   Ibuprofen                      PRESENT      UNEXPECTED Drug Absent but Declared for Prescription Verification   Oxycodone                      Not Detected UNEXPECTED ng/mg creat   Tizanidine                     Not Detected UNEXPECTED    Tizanidine, as indicated in the declared medication list, is not    always detected even when used as directed.   Salicylate                     Not Detected UNEXPECTED    Aspirin, as  indicated in the declared medication list, is not    always detected even when used as directed. ==================================================================== Test                      Result    Flag   Units      Ref Range   Creatinine              102              mg/dL      >=09 ==================================================================== Declared Medications:  The flagging and interpretation on this report are based on the  following declared medications.  Unexpected results may arise from  inaccuracies in the declared medications.  **Note: The testing scope of this panel includes these medications:  Aripiprazole (Abilify)  Butalbital (Butalbital/APAP/Caffeine)  Clonazepam (Klonopin)  Gabapentin (Neurontin)  Oxcarbazepine (Trileptal)  Oxycodone (Percocet)  Quetiapine (Seroquel)  **Note: The testing scope of this panel does not include small to  moderate amounts of these reported medications:  Acetaminophen (Butalbital/APAP/Caffeine)  Acetaminophen (Percocet)  Aspirin  Tizanidine (Zanaflex)  **Note: The testing scope of this panel does not include following  reported medications:  Alendronate (Fosamax)  Caffeine (Butalbital/APAP/Caffeine)  Calcium (Calcium/Cholecalciferol)  Cholecalciferol (Calcium/Cholecalciferol)  Lactulose (Constulose)  Levothyroxine  Nitroglycerin  (Nitrostat)  Ondansetron (Zofran)  Prednisone (Sterapred)  Rosuvastatin (Crestor)  Sumatriptan (Imitrex)  Vitamin B12  Vitamin D2 ==================================================================== For clinical consultation, please call [phone]. ====================================================================    Laboratory Chemistry Profile (12 mo)  Renal: 12/06/2018: BUN 9; Creatinine, Ser 0.99  Lab Results  Component Value Date   GFRAA >60 12/06/2018   GFRNONAA >60 12/06/2018   Hepatic: 09/24/2018: Albumin 4.2 Lab Results  Component Value Date   AST 27 09/24/2018   ALT 28 09/24/2018   Other: 08/05/2018: Vitamin B-12 303  Note: Above Lab results reviewed.  Imaging  CT HEAD WO CONTRAST CLINICAL DATA:  Persistent headache since seizure and fall a month ago.  EXAM: CT HEAD WITHOUT CONTRAST  TECHNIQUE: Contiguous axial images were obtained from the base of the skull through the vertex without intravenous contrast.  COMPARISON:  CT head dated October 05, 2011.  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 No underlying sulcal effacement. No evidence of acute infarction, hemorrhage, hydrocephalus, or mass lesion/mass effect.  Vascular: No hyperdense vessel or unexpected calcification.  Skull: Normal. Negative for fracture or focal lesion.  Sinuses/Orbits: No acute finding.  Other: None.  IMPRESSION: 1. 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 No underlying sulcal effacement. 2. No acute intracranial abnormality.  Electronically Signed   By: Obie Dredge M.D.   On: 03/21/2019 13:45   Assessment  The primary encounter diagnosis was Chronic pain syndrome. Diagnoses of Chronic knee pain (Bilateral) (L>R), Osteoarthritis of knees (Bilateral), Chronic low back pain (Primary Area of Pain) (Bilateral) (L>R) w/ sciatica, Chronic lower extremity  pain (Secondary Area of Pain) (Bilateral) (L>R), Chronic neck pain (Tertiary Area of Pain) (Bilateral) (L>R), Occipital headache (Fourth Area of Pain) (Bilateral) (L>R), Chronic musculoskeletal pain, and Neurogenic pain were also pertinent to this visit.  Plan of Care  Problem-specific:  No problem-specific Assessment & Plan notes found for this encounter.  I have discontinued Diana Quinn's ARIPiprazole ER, escitalopram, and DULoxetine. I am also having her start on tiZANidine, gabapentin, HYDROcodone-acetaminophen, and HYDROcodone-acetaminophen. Additionally, I am having her maintain her alendronate, Calcium Carb-Cholecalciferol, cyanocobalamin, ergocalciferol, levothyroxine, nitroGLYCERIN, Fyavolv, ondansetron,  rosuvastatin, ranolazine, isosorbide dinitrate, cetirizine, acarbose, butalbital-acetaminophen-caffeine, Galcanezumab-gnlm, zolpidem, polyethylene glycol, linaclotide, pantoprazole, traZODone, OXcarbazepine, metoprolol succinate, phentermine, torsemide, topiramate, tiZANidine, gabapentin, QUEtiapine, and HYDROcodone-acetaminophen.  Pharmacotherapy (Medications Ordered): Meds ordered this encounter  Medications  . tiZANidine (ZANAFLEX) 4 MG tablet    Sig: Take 1 tablet (4 mg total) by mouth 3 (three) times daily.    Dispense:  90 tablet    Refill:  5    Fill one day early if pharmacy is closed on scheduled refill date. May substitute for generic if available.  . gabapentin (NEURONTIN) 400 MG capsule    Sig: Take 1 capsule (400 mg total) by mouth 4 (four) times daily.    Dispense:  120 capsule    Refill:  5    Fill one day early if pharmacy is closed on scheduled refill date. May substitute for generic if available.  Marland Kitchen HYDROcodone-acetaminophen (NORCO/VICODIN) 5-325 MG tablet    Sig: Take 1 tablet by mouth every 8 (eight) hours as needed for severe pain. Must last 30 days    Dispense:  90 tablet    Refill:  0    Chronic Pain: STOP Act (Not applicable) Fill 1 day early if closed on  refill date. Do not fill until: 04/09/2019. To last until: 05/09/2019. Avoid benzodiazepines within 8 hours of opioids  . HYDROcodone-acetaminophen (NORCO/VICODIN) 5-325 MG tablet    Sig: Take 1 tablet by mouth every 8 (eight) hours as needed for severe pain. Must last 30 days    Dispense:  90 tablet    Refill:  0    Chronic Pain: STOP Act (Not applicable) Fill 1 day early if closed on refill date. Do not fill until: 05/09/2019. To last until: 06/08/2019. Avoid benzodiazepines within 8 hours of opioids  . HYDROcodone-acetaminophen (NORCO/VICODIN) 5-325 MG tablet    Sig: Take 1 tablet by mouth every 8 (eight) hours as needed for severe pain. Must last 30 days    Dispense:  90 tablet    Refill:  0    Chronic Pain: STOP Act (Not applicable) Fill 1 day early if closed on refill date. Do not fill until: 06/08/2019. To last until: 07/08/2019. Avoid benzodiazepines within 8 hours of opioids   Orders:  Orders Placed This Encounter  Procedures  . KNEE INJECTION    Local Anesthetic & Steroid injection.    Standing Status:   Future    Standing Expiration Date:   05/06/2019    Scheduling Instructions:     Side: Bilateral     Sedation: None     Timeframe: As soon as schedule allows    Order Specific Question:   Where will this procedure be performed?    Answer:   [facility]   Follow-up plan:   Return in about 13 weeks (around 07/06/2019) for (VV), (MM), in addition, Procedure (no sedation): (B) Knee inj.      Interventional management options:  Considering:   Diagnostic/therapeutic bilateral IA knee injection (steroid) #1  Diagnostic left L4-5 LESI  Diagnostic midline caudal ESI + epidurogram  Possible bilateral lumbar facetRFA Diagnostic bilateral occipital NB Possible bilateral occipital nerveRFA Diagnostic bilateral cervical facet block Possible bilateral cervical facetRFA Diagnostic left SI joint injection Possible left SI jointRFA   Palliative PRN treatment(s):    Diagnostic bilateral lumbar facet block #2    Recent Visits No visits were found meeting these conditions.  Showing recent visits within past 90 days and meeting all other requirements   Today's Visits Date Type Provider Dept  04/06/19 Telemedicine Delano Metz, MD [facility]  Showing today's visits and meeting all other requirements   Future Appointments No visits were found meeting these conditions.  Showing future appointments within next 90 days and meeting all other requirements   I discussed the assessment and treatment plan with the patient. The patient was provided an opportunity to ask questions and all were answered. The patient agreed with the plan and demonstrated an understanding of the instructions.  Patient advised to call back or seek an in-person evaluation if the symptoms or condition worsens.  Duration of encounter: 14 minutes.  Note by: Oswaldo Done, MD Date: 04/06/2019; Time: 8:50 AM

## 2019-04-05 NOTE — Telephone Encounter (Signed)
Patient missed appt on 04-05-19. Writer attempted to phone patient to reschedule but could not reach patient. Message left requesting patient phone to reschedule appt.

## 2019-04-06 ENCOUNTER — Ambulatory Visit: Payer: Medicaid Other | Attending: Pain Medicine | Admitting: Pain Medicine

## 2019-04-06 DIAGNOSIS — M792 Neuralgia and neuritis, unspecified: Secondary | ICD-10-CM

## 2019-04-06 DIAGNOSIS — G894 Chronic pain syndrome: Secondary | ICD-10-CM | POA: Diagnosis not present

## 2019-04-06 DIAGNOSIS — M25561 Pain in right knee: Secondary | ICD-10-CM | POA: Diagnosis not present

## 2019-04-06 DIAGNOSIS — M7918 Myalgia, other site: Secondary | ICD-10-CM

## 2019-04-06 DIAGNOSIS — M25562 Pain in left knee: Secondary | ICD-10-CM | POA: Diagnosis not present

## 2019-04-06 DIAGNOSIS — M170 Bilateral primary osteoarthritis of knee: Secondary | ICD-10-CM

## 2019-04-06 DIAGNOSIS — M5442 Lumbago with sciatica, left side: Secondary | ICD-10-CM

## 2019-04-06 DIAGNOSIS — M542 Cervicalgia: Secondary | ICD-10-CM

## 2019-04-06 DIAGNOSIS — M79604 Pain in right leg: Secondary | ICD-10-CM

## 2019-04-06 DIAGNOSIS — R519 Headache, unspecified: Secondary | ICD-10-CM

## 2019-04-06 DIAGNOSIS — M5441 Lumbago with sciatica, right side: Secondary | ICD-10-CM

## 2019-04-06 DIAGNOSIS — G8929 Other chronic pain: Secondary | ICD-10-CM

## 2019-04-06 DIAGNOSIS — M79605 Pain in left leg: Secondary | ICD-10-CM

## 2019-04-06 MED ORDER — HYDROCODONE-ACETAMINOPHEN 5-325 MG PO TABS: 1.0000 | ORAL_TABLET | Freq: Three times a day (TID) | ORAL | 0 refills | Status: DC | PRN

## 2019-04-06 MED ORDER — GABAPENTIN 400 MG PO CAPS: 400.0000 mg | ORAL_CAPSULE | Freq: Four times a day (QID) | ORAL | 5 refills | Status: DC

## 2019-04-06 MED ORDER — TIZANIDINE HCL 4 MG PO TABS: 4.0000 mg | ORAL_TABLET | Freq: Three times a day (TID) | ORAL | 5 refills | Status: DC

## 2019-04-10 DIAGNOSIS — I10 Essential (primary) hypertension: Secondary | ICD-10-CM | POA: Insufficient documentation

## 2019-04-10 DIAGNOSIS — Z8679 Personal history of other diseases of the circulatory system: Secondary | ICD-10-CM | POA: Insufficient documentation

## 2019-04-12 ENCOUNTER — Inpatient Hospital Stay: Payer: Medicaid Other | Admitting: Oncology

## 2019-04-12 ENCOUNTER — Inpatient Hospital Stay: Payer: Medicaid Other

## 2019-04-13 DIAGNOSIS — S82811A Torus fracture of upper end of right fibula, initial encounter for closed fracture: Secondary | ICD-10-CM | POA: Insufficient documentation

## 2019-04-13 HISTORY — DX: Torus fracture of upper end of right fibula, initial encounter for closed fracture: S82.811A

## 2019-04-13 MED ORDER — OXCARBAZEPINE 150 MG PO TABS
450.00 | ORAL_TABLET | ORAL | Status: DC
Start: 2019-04-12 — End: 2019-04-13

## 2019-04-13 MED ORDER — FLUOXETINE HCL 20 MG PO CAPS
20.00 | ORAL_CAPSULE | ORAL | Status: DC
Start: 2019-04-13 — End: 2019-04-13

## 2019-04-13 MED ORDER — DEXTROMETHORPHAN-GUAIFENESIN 10-100 MG/5ML PO LIQD
5.00 | ORAL | Status: DC
Start: ? — End: 2019-04-13

## 2019-04-13 MED ORDER — LEVOTHYROXINE SODIUM 88 MCG PO TABS
88.00 | ORAL_TABLET | ORAL | Status: DC
Start: 2019-04-13 — End: 2019-04-13

## 2019-04-13 MED ORDER — ISOSORBIDE MONONITRATE ER 30 MG PO TB24
30.00 | ORAL_TABLET | ORAL | Status: DC
Start: 2019-04-13 — End: 2019-04-13

## 2019-04-13 MED ORDER — INFLUENZA VAC SPLIT QUAD 0.5 ML IM SUSY
0.50 | PREFILLED_SYRINGE | INTRAMUSCULAR | Status: DC
Start: ? — End: 2019-04-13

## 2019-04-13 MED ORDER — RANOLAZINE ER 500 MG PO TB12
500.00 | ORAL_TABLET | ORAL | Status: DC
Start: 2019-04-12 — End: 2019-04-13

## 2019-04-13 MED ORDER — ENOXAPARIN SODIUM 40 MG/0.4ML ~~LOC~~ SOLN
40.00 | SUBCUTANEOUS | Status: DC
Start: 2019-04-13 — End: 2019-04-13

## 2019-04-13 MED ORDER — HYDROCODONE-ACETAMINOPHEN 5-325 MG PO TABS
1.00 | ORAL_TABLET | ORAL | Status: DC
Start: ? — End: 2019-04-13

## 2019-04-13 MED ORDER — PANTOPRAZOLE SODIUM 40 MG PO TBEC
40.00 | DELAYED_RELEASE_TABLET | ORAL | Status: DC
Start: 2019-04-12 — End: 2019-04-13

## 2019-04-13 MED ORDER — GABAPENTIN 400 MG PO CAPS
400.00 | ORAL_CAPSULE | ORAL | Status: DC
Start: 2019-04-12 — End: 2019-04-13

## 2019-04-13 MED ORDER — ATORVASTATIN CALCIUM 40 MG PO TABS
40.00 | ORAL_TABLET | ORAL | Status: DC
Start: 2019-04-13 — End: 2019-04-13

## 2019-04-13 MED ORDER — SODIUM CHLORIDE FLUSH 0.9 % IV SOLN
5.00 | INTRAVENOUS | Status: DC
Start: 2019-04-12 — End: 2019-04-13

## 2019-04-13 MED ORDER — OYSTER SHELL CALCIUM/D 500-200 MG-UNIT PO TABS
1.00 | ORAL_TABLET | ORAL | Status: DC
Start: 2019-04-13 — End: 2019-04-13

## 2019-04-13 MED ORDER — ONDANSETRON HCL 4 MG/2ML IJ SOLN
4.00 | INTRAMUSCULAR | Status: DC
Start: ? — End: 2019-04-13

## 2019-04-13 MED ORDER — SODIUM CHLORIDE FLUSH 0.9 % IV SOLN
5.00 | INTRAVENOUS | Status: DC
Start: ? — End: 2019-04-13

## 2019-04-13 MED ORDER — BISACODYL 5 MG PO TBEC
10.00 | DELAYED_RELEASE_TABLET | ORAL | Status: DC
Start: ? — End: 2019-04-13

## 2019-04-13 MED ORDER — FAMOTIDINE 20 MG PO TABS
40.00 | ORAL_TABLET | ORAL | Status: DC
Start: 2019-04-12 — End: 2019-04-13

## 2019-04-13 MED ORDER — TIZANIDINE HCL 4 MG PO TABS
4.00 | ORAL_TABLET | ORAL | Status: DC
Start: ? — End: 2019-04-13

## 2019-04-13 MED ORDER — ALUM & MAG HYDROXIDE-SIMETH 200-200-20 MG/5ML PO SUSP
30.00 | ORAL | Status: DC
Start: ? — End: 2019-04-13

## 2019-04-13 MED ORDER — CETIRIZINE HCL 10 MG PO TABS
10.00 | ORAL_TABLET | ORAL | Status: DC
Start: 2019-04-13 — End: 2019-04-13

## 2019-04-13 MED ORDER — MELATONIN 3 MG PO TABS
6.00 | ORAL_TABLET | ORAL | Status: DC
Start: 2019-04-12 — End: 2019-04-13

## 2019-04-13 MED ORDER — QUETIAPINE FUMARATE 200 MG PO TABS
600.00 | ORAL_TABLET | ORAL | Status: DC
Start: 2019-04-12 — End: 2019-04-13

## 2019-04-14 ENCOUNTER — Ambulatory Visit: Payer: Medicaid Other | Admitting: Pain Medicine

## 2019-04-14 DIAGNOSIS — S93402A Sprain of unspecified ligament of left ankle, initial encounter: Secondary | ICD-10-CM | POA: Insufficient documentation

## 2019-05-19 ENCOUNTER — Other Ambulatory Visit: Payer: Self-pay

## 2019-05-19 ENCOUNTER — Emergency Department (HOSPITAL_BASED_OUTPATIENT_CLINIC_OR_DEPARTMENT_OTHER): Payer: Medicaid Other

## 2019-05-19 ENCOUNTER — Encounter (HOSPITAL_BASED_OUTPATIENT_CLINIC_OR_DEPARTMENT_OTHER): Payer: Self-pay

## 2019-05-19 ENCOUNTER — Emergency Department (HOSPITAL_BASED_OUTPATIENT_CLINIC_OR_DEPARTMENT_OTHER)
Admission: EM | Admit: 2019-05-19 | Discharge: 2019-05-19 | Disposition: A | Discharge status: Home / Self Care | Payer: Medicaid Other | Attending: Emergency Medicine | Admitting: Emergency Medicine

## 2019-05-19 DIAGNOSIS — R413 Other amnesia: Secondary | ICD-10-CM | POA: Diagnosis not present

## 2019-05-19 DIAGNOSIS — R531 Weakness: Secondary | ICD-10-CM

## 2019-05-19 DIAGNOSIS — R200 Anesthesia of skin: Secondary | ICD-10-CM | POA: Insufficient documentation

## 2019-05-19 DIAGNOSIS — R251 Tremor, unspecified: Secondary | ICD-10-CM | POA: Diagnosis not present

## 2019-05-19 DIAGNOSIS — Z79899 Other long term (current) drug therapy: Secondary | ICD-10-CM | POA: Diagnosis not present

## 2019-05-19 DIAGNOSIS — R0602 Shortness of breath: Secondary | ICD-10-CM | POA: Diagnosis not present

## 2019-05-19 DIAGNOSIS — E039 Hypothyroidism, unspecified: Secondary | ICD-10-CM | POA: Diagnosis not present

## 2019-05-19 DIAGNOSIS — R42 Dizziness and giddiness: Secondary | ICD-10-CM | POA: Insufficient documentation

## 2019-05-19 DIAGNOSIS — R55 Syncope and collapse: Secondary | ICD-10-CM | POA: Diagnosis not present

## 2019-05-19 DIAGNOSIS — R4781 Slurred speech: Secondary | ICD-10-CM | POA: Insufficient documentation

## 2019-05-19 DIAGNOSIS — R519 Headache, unspecified: Secondary | ICD-10-CM | POA: Diagnosis not present

## 2019-05-19 LAB — CBC WITH DIFFERENTIAL/PLATELET
Abs Immature Granulocytes: 0.03 10*3/uL (ref 0.00–0.07)
Basophils Absolute: 0.1 10*3/uL (ref 0.0–0.1)
Basophils Relative: 1 %
Eosinophils Absolute: 0.1 10*3/uL (ref 0.0–0.5)
Eosinophils Relative: 1 %
HCT: 35.3 % — ABNORMAL LOW (ref 36.0–46.0)
Hemoglobin: 11.3 g/dL — ABNORMAL LOW (ref 12.0–15.0)
Immature Granulocytes: 0 %
Lymphocytes Relative: 14 %
Lymphs Abs: 1.1 10*3/uL (ref 0.7–4.0)
MCH: 32.8 pg (ref 26.0–34.0)
MCHC: 32 g/dL (ref 30.0–36.0)
MCV: 102.3 fL — ABNORMAL HIGH (ref 80.0–100.0)
Monocytes Absolute: 0.7 10*3/uL (ref 0.1–1.0)
Monocytes Relative: 9 %
Neutro Abs: 5.7 10*3/uL (ref 1.7–7.7)
Neutrophils Relative %: 75 %
Platelets: 388 10*3/uL (ref 150–400)
RBC: 3.45 MIL/uL — ABNORMAL LOW (ref 3.87–5.11)
RDW: 13.8 % (ref 11.5–15.5)
WBC: 7.6 10*3/uL (ref 4.0–10.5)
nRBC: 0 % (ref 0.0–0.2)

## 2019-05-19 LAB — RAPID URINE DRUG SCREEN, HOSP PERFORMED
Amphetamines: POSITIVE — AB
Barbiturates: POSITIVE — AB
Benzodiazepines: NOT DETECTED
Cocaine: NOT DETECTED
Opiates: NOT DETECTED
Tetrahydrocannabinol: NOT DETECTED

## 2019-05-19 LAB — COMPREHENSIVE METABOLIC PANEL
ALT: 17 U/L (ref 0–44)
AST: 19 U/L (ref 15–41)
Albumin: 3.1 g/dL — ABNORMAL LOW (ref 3.5–5.0)
Alkaline Phosphatase: 162 U/L — ABNORMAL HIGH (ref 38–126)
Anion gap: 8 (ref 5–15)
BUN: 10 mg/dL (ref 6–20)
CO2: 15 mmol/L — ABNORMAL LOW (ref 22–32)
Calcium: 8.1 mg/dL — ABNORMAL LOW (ref 8.9–10.3)
Chloride: 112 mmol/L — ABNORMAL HIGH (ref 98–111)
Creatinine, Ser: 1.23 mg/dL — ABNORMAL HIGH (ref 0.44–1.00)
GFR calc Af Amer: 59 mL/min — ABNORMAL LOW (ref >60)
GFR calc non Af Amer: 51 mL/min — ABNORMAL LOW (ref >60)
Glucose, Bld: 95 mg/dL (ref 70–99)
Potassium: 3.5 mmol/L (ref 3.5–5.1)
Sodium: 135 mmol/L (ref 135–145)
Total Bilirubin: 0.6 mg/dL (ref 0.3–1.2)
Total Protein: 5.9 g/dL — ABNORMAL LOW (ref 6.5–8.1)

## 2019-05-19 LAB — LACTIC ACID, PLASMA: Lactic Acid, Venous: 1.1 mmol/L (ref 0.5–1.9)

## 2019-05-19 LAB — URINALYSIS, ROUTINE W REFLEX MICROSCOPIC
Bilirubin Urine: NEGATIVE
Glucose, UA: NEGATIVE mg/dL
Hgb urine dipstick: NEGATIVE
Ketones, ur: NEGATIVE mg/dL
Leukocytes,Ua: NEGATIVE
Nitrite: NEGATIVE
Protein, ur: NEGATIVE mg/dL
Specific Gravity, Urine: 1.01 (ref 1.005–1.030)
pH: 6 (ref 5.0–8.0)

## 2019-05-19 LAB — TROPONIN I (HIGH SENSITIVITY)
Troponin I (High Sensitivity): 8 ng/L (ref <18)
Troponin I (High Sensitivity): 10 ng/L (ref <18)

## 2019-05-19 LAB — MAGNESIUM: Magnesium: 1.6 mg/dL — ABNORMAL LOW (ref 1.7–2.4)

## 2019-05-19 MED ORDER — LACTATED RINGERS IV BOLUS
1000.0000 mL | Freq: Once | INTRAVENOUS | Status: AC
  Administered 2019-05-19: 1000 mL via INTRAVENOUS

## 2019-05-19 MED ORDER — IOHEXOL 350 MG/ML SOLN
100.0000 mL | Freq: Once | INTRAVENOUS | Status: AC | PRN
  Administered 2019-05-19: 100 mL via INTRAVENOUS

## 2019-05-19 MED ORDER — MAGNESIUM SULFATE 50 % IJ SOLN
2.0000 g | Freq: Once | INTRAMUSCULAR | Status: AC
  Administered 2019-05-19: 17:00:00 2 g via INTRAVENOUS
  Filled 2019-05-19: qty 4

## 2019-05-19 NOTE — ED Provider Notes (Signed)
[facility] EMERGENCY DEPARTMENT Provider Note   CSN: [id number] Arrival date & time: 05/19/19  1410     History Chief Complaint  Patient presents with  . Multiple c/o    Diana Quinn is a 54 y.o. female.  HPI 54 year old female presents with a chief complaint of ride sided weakness.  Patient states this has been ongoing for about a month.  Occurs every day but is not 24/7.  Today she was driving for the first time in a while and went to a store and was feeling lightheaded at her right leg was starting to give out.  She was able to make it back to her car.  No headache today but she did have a headache yesterday.  No chest pain.  She felt short of breath during this period of lightheadedness but this has been ongoing for quite some time.  She chronically has sinus tachycardia and is being treated with metoprolol.  She notes her heart rate was fast today.  Her right arm has also been weak and she occasionally has things look out of her hand. No neck pain. Has chronic back pain that is unchanged. She will also often has stuttering and slurred speech. Was admitted at the end of January with multiple syncopal episodes.   Past Medical History:  Diagnosis Date  . Anemia   . Gastric bypass status for obesity   . H/O unstable angina   . Hypoglycemia   . MI (myocardial infarction) (HCC)   . Orthostatic hypotension   . Osteoporosis   . Sciatic nerve disease   . Seizures (HCC)   . Thyroid disease     Patient Active Problem List   Diagnosis Date Noted  . Acute marginal ulcer 12/16/2018  . Seizure (HCC) 10/02/2018  . H/O gastric bypass 07/27/2018  . Iron deficiency anemia 07/27/2018  . Chronic abdominal pain 07/12/2018  . Intractable abdominal pain 07/12/2018  . Hypoglycemia after GI (gastrointestinal) surgery 06/29/2018  . Spondylosis without myelopathy or radiculopathy, lumbosacral region 04/27/2018  . Chronic low back pain (Bilateral) (L>R) w/o sciatica 04/27/2018  .  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 Chronic hip pain (Bilateral) 04/14/2018  . Chronic sacroiliac joint pain (Bilateral) 04/14/2018  . Lumbar facet syndrome (Bilateral) (L>R) 04/14/2018  . Neurogenic pain 04/14/2018  . Chronic musculoskeletal pain 04/14/2018  . Chronic lumbar radiculitis (S1) (Bilateral) 04/14/2018  . Osteoarthritis of knees (Bilateral) 04/13/2018  . Tricompartment osteoarthritis of knee (Right) 04/13/2018  . Chronic low back pain (Primary Area of Pain) (Bilateral) (L>R) w/ sciatica 03/31/2018  . Chronic lower extremity pain (Secondary Area of Pain) (Bilateral) (L>R) 03/31/2018  . Chronic neck pain [facility] Area of Pain) (Bilateral) (L>R) 03/31/2018  . Occipital headache (Fourth Area of Pain) (Bilateral) (L>R) 03/31/2018  . Pharmacologic therapy 03/31/2018  . Disorder of skeletal system 03/31/2018  . Problems influencing health status 03/31/2018  . Chronic knee pain (Bilateral) (L>R) 03/31/2018  . Chronic pain syndrome 03/31/2018  . DDD (degenerative disc disease), lumbar 02/08/2018  . Other and unspecified hyperlipidemia 02/08/2018  . Lumbar spondylosis 02/08/2018  . Vitamin D deficiency 02/08/2018  . Angina pectoris (HCC) 01/26/2018  . Hypotension 01/26/2018  . Impaired glucose tolerance 01/26/2018  . Other B-complex deficiencies 01/26/2018  . Schizophrenia (HCC) 01/26/2018  . Epilepsy (HCC) 07/10/2011  . Bariatric surgery status 03/24/2011  . Bipolar disorder (HCC) 03/24/2011  . Generalized anxiety disorder 03/24/2011  . Hypothyroidism 03/24/2011  . Migraine headache 03/24/2011  . Sleep disturbances 03/24/2011  Past Surgical History:  Procedure Laterality Date  . ABDOMINAL ADHESION SURGERY    . ABDOMINAL HYSTERECTOMY    . CHOLECYSTECTOMY    . DILATION AND CURETTAGE OF UTERUS    . ESOPHAGOGASTRODUODENOSCOPY    . GASTRIC BYPASS    . GASTRIC BYPASS OPEN     revision  . GASTROSTOMY W/ FEEDING TUBE    . HERNIA REPAIR    . OOPHORECTOMY    .  SPLENECTOMY    . TONSILLECTOMY       OB History   No obstetric history on file.     Family History  Problem Relation Age of Onset  . Cancer Mother   . Hypertension Mother   . COPD Mother   . Asthma Sister   . Psoriasis Sister   . Epilepsy Brother     Social History   Tobacco Use  . Smoking status: Never Smoker  . Smokeless tobacco: Never Used  Substance Use Topics  . Alcohol use: Not Currently  . Drug use: Never    Home Medications Prior to Admission medications   Medication Sig Start Date End Date Taking? Authorizing Provider  acarbose (PRECOSE) 100 MG tablet Take 100 mg by mouth 3 (three) times daily. With meals 06/29/18   [provider]  alendronate (FOSAMAX) 70 MG tablet Take 70 mg by mouth once a week. 02/19/18   [provider]  butalbital-acetaminophen-caffeine (FIORICET) 50-325-40 MG tablet Take 1 tablet by mouth every 4 (four) hours as needed for pain.    [provider]  Calcium Carb-Cholecalciferol (CALTRATE 600+D3) 600-800 MG-UNIT TABS Take 1 tablet by mouth 2 (two) times daily. 01/26/18   [provider]  cetirizine (ZYRTEC) 10 MG tablet Take 10 mg by mouth daily. 04/29/18   [provider]  cyanocobalamin (,VITAMIN B-12,) 1000 MCG/ML injection Inject 1,000 mcg into the muscle every 30 (thirty) days. 02/25/18   [provider]  ergocalciferol (VITAMIN D2) 1.25 MG (50000 UT) capsule Take 1.25 mcg by mouth once a week.  02/08/18   [provider]  FYAVOLV 0.5-2.5 MG-MCG tablet Take 1 tablet by mouth daily. 02/19/18   [provider]  gabapentin (NEURONTIN) 400 MG capsule Take 1 capsule (400 mg total) by mouth 4 (four) times daily. 01/09/19 07/08/19  Delano MetzNaveira, Francisco, MD  gabapentin (NEURONTIN) 400 MG capsule Take 1 capsule (400 mg total) by mouth 4 (four) times daily. 07/08/19 01/04/20  Delano MetzNaveira, Francisco, MD  Galcanezumab-gnlm 120 MG/ML SOAJ Inject 120 mg into the skin every 30 (thirty) days.  06/28/18   [provider]  HYDROcodone-acetaminophen (NORCO/VICODIN) 5-325 MG tablet Take 1 tablet by mouth every 8 (eight) hours as needed for severe pain. Must last 30 days 04/09/19 05/09/19  Delano MetzNaveira, Francisco, MD  HYDROcodone-acetaminophen (NORCO/VICODIN) 5-325 MG tablet Take 1 tablet by mouth every 8 (eight) hours as needed for severe pain. Must last 30 days 05/09/19 06/08/19  Delano MetzNaveira, Francisco, MD  HYDROcodone-acetaminophen (NORCO/VICODIN) 5-325 MG tablet Take 1 tablet by mouth every 8 (eight) hours as needed for severe pain. Must last 30 days 06/08/19 07/08/19  Delano MetzNaveira, Francisco, MD  isosorbide dinitrate (ISORDIL) 30 MG tablet Take 30 mg by mouth daily.    [provider]  levothyroxine (LEVOXYL) 88 MCG tablet Take 88 mcg by mouth daily. 01/26/18   [provider]  linaclotide (LINZESS) 145 MCG CAPS capsule Take 290 mcg by mouth daily before breakfast.     [provider]  metoprolol succinate (TOPROL-XL) 50 MG 24 hr tablet 100 mg.  11/22/18   [provider]  nitroGLYCERIN (NITROSTAT) 0.4 MG SL tablet Place 0.4 mg under the tongue as needed. 01/26/18   [provider]  ondansetron (ZOFRAN) 8 MG tablet Take 8 mg by mouth as needed. 01/26/18   [provider]  OXcarbazepine (TRILEPTAL) 300 MG/5ML suspension Take 450 mg by mouth 2 (two) times daily.    [provider]  pantoprazole (PROTONIX) 40 MG tablet Take 40 mg by mouth daily.    [provider]  phentermine (ADIPEX-P) 37.5 MG tablet TK 1 T PO QAM 11/08/18   [provider]  polyethylene glycol (MIRALAX / GLYCOLAX) 17 g packet Take 17 g by mouth 2 (two) times daily. Patient taking differently: Take 17 g by mouth daily as needed.  07/16/18   Altamese Dilling, MD  QUEtiapine (SEROQUEL) 300 MG tablet Take 300 mg by mouth at bedtime.    [provider]  ranolazine (RANEXA) 500 MG 12 hr tablet Take 500 mg by mouth 2 (two) times daily.    [provider]  rosuvastatin (CRESTOR) 5 MG tablet Take 10 mg by mouth daily.  02/08/18   [provider]  tiZANidine (ZANAFLEX) 4 MG tablet Take 1 tablet (4 mg total) by mouth 3 (three) times daily. 01/09/19 07/08/19  Delano Metz, MD  tiZANidine (ZANAFLEX) 4 MG tablet Take 1 tablet (4 mg total) by mouth 3 (three) times daily. 07/08/19 01/04/20  Delano Metz, MD  topiramate (TOPAMAX) 50 MG tablet Take 50 mg by mouth 2 (two) times daily.    [provider]  torsemide (DEMADEX) 10 MG tablet TK 1 T PO PRF SWELLING 11/11/18   [provider]  traZODone (DESYREL) 100 MG tablet Take 100 mg by mouth at bedtime.    [provider]  zolpidem (AMBIEN) 5 MG tablet Take 5 mg by mouth at bedtime. 07/09/18 04/05/19  [provider]    Allergies    Depakote [divalproex sodium], Pregabalin, and Levetiracetam  Review of Systems   Review of Systems  Constitutional: Negative for fever.  Respiratory: Positive for shortness of breath.   Cardiovascular: Negative for chest pain.  Gastrointestinal: Negative for abdominal pain.  Musculoskeletal: Positive for back pain. Negative for neck pain.  Neurological: Positive for weakness, light-headedness, numbness and headaches.  All other systems reviewed and are negative.   Physical Exam Updated Vital Signs BP 126/89   Pulse 85   Temp 98.3 F (36.8 C) (Oral)   Resp (!) 21   Ht  (1.6 m)   Wt 78.9 kg   SpO2 100%   BMI 30.82 kg/m   Physical Exam Vitals and nursing note reviewed.  Constitutional:      General: She is not in acute distress.    Appearance: She is well-developed. She is not ill-appearing or diaphoretic.  HENT:     Head: Normocephalic and atraumatic.     Right Ear: External ear normal.     Left Ear: External ear normal.     Nose: Nose normal.  Eyes:     General:        Right eye: No discharge.        Left eye: No discharge.     Extraocular Movements: Extraocular movements intact.      Pupils: Pupils are equal, round, and reactive to light.  Cardiovascular:     Rate and Rhythm: Normal rate and regular rhythm.     Heart sounds: Normal heart sounds. No murmur.  Pulmonary:  Effort: Pulmonary effort is normal.     Breath sounds: Normal breath sounds.  Abdominal:     Palpations: Abdomen is soft.     Tenderness: There is no abdominal tenderness.  Skin:    General: Skin is warm and dry.  Neurological:     Mental Status: She is alert.     Comments: Normal speech. Subjective numbness to right sided of face. Subjective decreased sensation to right arm and leg. 5/5 strength in all 4 extremities, but seems slightly weaker in RUE, RLE. Normal finger to nose.   Psychiatric:        Mood and Affect: Mood is not anxious.     ED Results / Procedures / Treatments   Labs (all labs ordered are listed, but only abnormal results are displayed) Labs Reviewed  COMPREHENSIVE METABOLIC PANEL - Abnormal; Notable for the following components:      Result Value   Chloride 112 (*)    CO2 15 (*)    Creatinine, Ser 1.23 (*)    Calcium 8.1 (*)    Total Protein 5.9 (*)    Albumin 3.1 (*)    Alkaline Phosphatase 162 (*)    GFR calc non Af Amer 51 (*)    GFR calc Af Amer 59 (*)    All other components within normal limits  CBC WITH DIFFERENTIAL/PLATELET - Abnormal; Notable for the following components:   RBC 3.45 (*)    Hemoglobin 11.3 (*)    HCT 35.3 (*)    MCV 102.3 (*)    All other components within normal limits  URINALYSIS, ROUTINE W REFLEX MICROSCOPIC - Abnormal; Notable for the following components:   APPearance CLOUDY (*)    All other components within normal limits  MAGNESIUM - Abnormal; Notable for the following components:   Magnesium 1.6 (*)    All other components within normal limits  RAPID URINE DRUG SCREEN, HOSP PERFORMED - Abnormal; Notable for the following components:   Amphetamines POSITIVE (*)    Barbiturates POSITIVE (*)    All other components within normal  limits  LACTIC ACID, PLASMA  CBG MONITORING, ED  TROPONIN I (HIGH SENSITIVITY)  TROPONIN I (HIGH SENSITIVITY)    EKG EKG Interpretation  Date/Time:  Thursday May 19 2019 14:36:45 EST Ventricular Rate:  93 PR Interval:    QRS Duration: 98 QT Interval:  403 QTC Calculation: 502 R Axis:   -47 Text Interpretation: Sinus rhythm Probable left atrial enlargement Left anterior fascicular block Probable anterior infarct, age indeterminate increased rate comapred with prior 9/20 Confirmed by Meridee Score [id number]) on 05/19/2019 2:39:28 PM   Radiology CT Angio Head W or Wo Contrast  Result Date: 05/19/2019 CLINICAL DATA:  Right lower extremity weakness EXAM: CT ANGIOGRAPHY HEAD AND NECK TECHNIQUE: Multidetector CT imaging of the head and neck was performed using the standard protocol during bolus administration of intravenous contrast. Multiplanar CT image reconstructions and MIPs were obtained to evaluate the vascular anatomy. Carotid stenosis measurements (when applicable) are obtained utilizing NASCET criteria, using the distal internal carotid diameter as the denominator. CONTRAST:  OMNIPAQUE IOHEXOL 350 MG/ML SOLN COMPARISON:  CT head earlier same day FINDINGS: CT HEAD Brain: There is no acute intracranial hemorrhage, mass effect, or edema. Gray-white differentiation is preserved. There is no extra-axial fluid collection. Ventricles and sulci are within normal limits in size and configuration. Vascular: No hyperdense vessel or unexpected calcification. Skull: Calvarium is unremarkable. Sinuses/Orbits: No acute finding. Other: None. Review of the MIP images confirms the above  findings CTA NECK Aortic arch: Great vessel origins are patent. Right carotid system: Patent.  No measurable stenosis. Left carotid system: Patent.  No measurable stenosis. Vertebral arteries: Patent. Left vertebral artery is slightly dominant. Skeleton: Mild degenerative changes primarily at C5-C6. Other neck: No mass  or adenopathy. Upper chest: No apical lung mass. Review of the MIP images confirms the above findings CTA HEAD Anterior circulation: Intracranial internal carotid arteries are patent. Anterior and middle cerebral arteries are patent. Posterior circulation: Intracranial vertebral arteries, basilar artery, and posterior cerebral arteries are patent. Large left and diminutive right posterior communicating arteries. Venous sinuses: As permitted by contrast timing, patent. Review of the MIP images confirms the above findings IMPRESSION: No large vessel occlusion, hemodynamically significant stenosis, or evidence of dissection. Electronically Signed   By: Guadlupe Spanish M.D.   On: 05/19/2019 17:25   CT Head Wo Contrast  Result Date: 05/19/2019 CLINICAL DATA:  Right lower extremity weakness, shaking, stuttering and syncopal episodes for 1 month. Memory loss. EXAM: CT HEAD WITHOUT CONTRAST TECHNIQUE: Contiguous axial images were obtained from the base of the skull through the vertex without intravenous contrast. COMPARISON:  03/21/2019 FINDINGS: Brain: Stable mildly enlarged ventricles and cortical sulci. Stable minimal patchy white matter low density in both cerebral hemispheres. No intracranial hemorrhage, mass lesion or CT evidence of acute infarction. Vascular: No hyperdense vessel or unexpected calcification. Skull: Normal. Negative for fracture or focal lesion. Sinuses/Orbits: Unremarkable. Other: None. IMPRESSION: 1. No acute abnormality. 2. Stable mild atrophy and minimal chronic small vessel white matter ischemic changes. Electronically Signed   By: Beckie Salts M.D.   On: 05/19/2019 15:34   CT Angio Neck W and/or Wo Contrast  Result Date: 05/19/2019 CLINICAL DATA:  Right lower extremity weakness EXAM: CT ANGIOGRAPHY HEAD AND NECK TECHNIQUE: Multidetector CT imaging of the head and neck was performed using the standard protocol during bolus administration of intravenous contrast. Multiplanar CT image  reconstructions and MIPs were obtained to evaluate the vascular anatomy. Carotid stenosis measurements (when applicable) are obtained utilizing NASCET criteria, using the distal internal carotid diameter as the denominator. CONTRAST:  OMNIPAQUE IOHEXOL 350 MG/ML SOLN COMPARISON:  CT head earlier same day FINDINGS: CT HEAD Brain: There is no acute intracranial hemorrhage, mass effect, or edema. Gray-white differentiation is preserved. There is no extra-axial fluid collection. Ventricles and sulci are within normal limits in size and configuration. Vascular: No hyperdense vessel or unexpected calcification. Skull: Calvarium is unremarkable. Sinuses/Orbits: No acute finding. Other: None. Review of the MIP images confirms the above findings CTA NECK Aortic arch: Great vessel origins are patent. Right carotid system: Patent.  No measurable stenosis. Left carotid system: Patent.  No measurable stenosis. Vertebral arteries: Patent. Left vertebral artery is slightly dominant. Skeleton: Mild degenerative changes primarily at C5-C6. Other neck: No mass or adenopathy. Upper chest: No apical lung mass. Review of the MIP images confirms the above findings CTA HEAD Anterior circulation: Intracranial internal carotid arteries are patent. Anterior and middle cerebral arteries are patent. Posterior circulation: Intracranial vertebral arteries, basilar artery, and posterior cerebral arteries are patent. Large left and diminutive right posterior communicating arteries. Venous sinuses: As permitted by contrast timing, patent. Review of the MIP images confirms the above findings IMPRESSION: No large vessel occlusion, hemodynamically significant stenosis, or evidence of dissection. Electronically Signed   By: Guadlupe Spanish M.D.   On: 05/19/2019 17:25   DG Chest Portable 1 View  Result Date: 05/19/2019 CLINICAL DATA:  Shortness of breath EXAM: PORTABLE CHEST 1  VIEW COMPARISON:  Chest x-rays dated 12/06/2018 and 06/08/2011.  FINDINGS: The heart size and mediastinal contours are within normal limits. Both lungs are clear. The visualized skeletal structures are unremarkable. IMPRESSION: No active disease. Electronically Signed   By: Franki Cabot M.D.   On: 05/19/2019 16:08    Procedures Ultrasound ED Peripheral IV (Provider)  Date/Time: 05/19/2019 4:34 PM Performed by: Sherwood Gambler, MD Authorized by: Sherwood Gambler, MD   Procedure details:    Indications: poor IV access     Skin Prep: chlorhexidine gluconate     Location:  Left AC   Angiocath:  20 G   Bedside Ultrasound Guided: Yes     Patient tolerated procedure without complications: Yes     Dressing applied: Yes     (including critical care time)  Medications Ordered in ED Medications  lactated ringers bolus 1,000 mL ( Intravenous Stopped 05/19/19 1628)  magnesium sulfate (IV Push/IM) injection 2 g (2 g Intravenous Given 05/19/19 1721)  iohexol (OMNIPAQUE) 350 MG/ML injection 100 mL (100 mLs Intravenous Contrast Given 05/19/19 1656)    ED Course  I have reviewed the triage vital signs and the nursing notes.  Pertinent labs & imaging results that were available during my care of the patient were reviewed by me and considered in my medical decision making (see chart for details).    MDM Rules/Calculators/A&P                      Patient was able to ambulate to the bathroom.  Labs show moderately decreased bicarbonate level but this was also decreased when she was at [facility] a couple months ago.  Partially due to the hyperchloremia and there is no anion gap acidosis.  Negative lactate.  She does have a mild bump in her creatinine and was given IV fluids.  These neuro symptoms seem to happen when she has near syncope/dehydration and so CT was obtained to help rule out obvious arterial narrowing or obstruction.  CTA is negative.  Given she is able to be ambulatory and this has been ongoing for about a month, I do not think further emergent  work-up is needed but she can follow-up with her neurologist.  She just had negative work-up for PE a couple months ago and this shortness of breath/dizziness is a recurrent issue.  Thus I do not think further work-up needed today/MRI.  Will discharge home with return precautions. Final Clinical Impression(s) / ED Diagnoses Final diagnoses:  Right sided weakness    Rx / DC Orders ED Discharge Orders    None       Sherwood Gambler, MD 05/19/19 2009

## 2019-05-19 NOTE — Discharge Instructions (Signed)
Call your neurologist tomorrow for outpatient appointment and follow-up. If you develop severe headache, new or worsening weakness, chest pain, or any other new/concerning symptoms return to the ER for evaluation.

## 2019-05-19 NOTE — ED Notes (Addendum)
dfr [place] into start left ac IV using [place]

## 2019-05-19 NOTE — ED Triage Notes (Signed)
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

## 2019-05-19 NOTE — ED Notes (Signed)
Pt reports memory loss , dizziness ,   hand jerking and weakness in her legs x over a month , states but had not been driving but [place] the stor she felt weak and her legs gave out , I  Warned pt that she should not be driving AT ALL, but pt states she is stubborn and she felt better, told pts boyfriend also  That she should NOT be driving

## 2019-05-25 ENCOUNTER — Other Ambulatory Visit: Payer: Self-pay | Admitting: Neurology

## 2019-05-25 ENCOUNTER — Telehealth: Payer: Self-pay | Admitting: *Deleted

## 2019-05-25 DIAGNOSIS — R569 Unspecified convulsions: Secondary | ICD-10-CM

## 2019-05-25 NOTE — Telephone Encounter (Signed)
Patient informed PA for Hydrocodone will be done today.

## 2019-06-07 ENCOUNTER — Other Ambulatory Visit: Payer: Self-pay | Admitting: Neurology

## 2019-06-07 ENCOUNTER — Other Ambulatory Visit: Payer: Self-pay

## 2019-06-07 ENCOUNTER — Ambulatory Visit
Admission: RE | Admit: 2019-06-07 | Discharge: 2019-06-07 | Disposition: A | Discharge status: Home / Self Care | Payer: Medicaid Other | Source: Ambulatory Visit | Attending: Neurology | Admitting: Neurology

## 2019-06-07 DIAGNOSIS — R569 Unspecified convulsions: Secondary | ICD-10-CM

## 2019-06-07 MED ORDER — GADOBUTROL 1 MMOL/ML IV SOLN: 9.0000 mL | Freq: Once | INTRAVENOUS | Status: DC | PRN

## 2019-06-18 ENCOUNTER — Ambulatory Visit: Payer: Medicaid Other

## 2019-07-05 ENCOUNTER — Encounter: Payer: Self-pay | Admitting: Pain Medicine

## 2019-07-05 NOTE — Progress Notes (Signed)
Patient: Diana Quinn  Service Category: E/M  Provider: Gaspar Cola, MD  DOB: 20-Mar-1965  DOS: 07/06/2019  Location: Office  MRN: [id number]  Setting: Ambulatory outpatient  Referring Provider: Danelle Berry, NP  Type: Established Patient  Specialty: Interventional Pain Management  PCP: Danelle Berry, NP  Location: Remote location  Delivery: TeleHealth     Virtual Encounter - Pain Management PROVIDER NOTE: Information contained herein reflects review and annotations entered in association with encounter. Interpretation of such information and data should be left to medically-trained personnel. Information provided to patient can be located elsewhere in the medical record under "Patient Instructions". Document created using STT-dictation technology, any transcriptional errors that may result from process are unintentional.    Contact & Pharmacy Preferred: [phone] Home: [phone] (home) Mobile: [phone] (mobile) E-mail: [email or web address]  [facility] [address] - [place], [place] - [address] [place] [address] Phone: [phone] Fax: [phone]   Pre-screening  Ms. Damita Dunnings offered "in-person" vs "virtual" encounter. She indicated preferring virtual for this encounter.   Reason COVID-19*  Social distancing based on CDC and AMA recommendations.   I contacted Kaytelyn Glore on 07/06/2019 via telephone.      I clearly identified myself as Gaspar Cola, MD. I verified that I was speaking with the correct person using two identifiers (Name: Holden Draughon, and date of birth: 1965/12/16).  Consent I sought verbal advanced consent from [place] for virtual visit interactions. I informed Ms. Wiegel of possible security and privacy concerns, risks, and limitations associated with providing "not-in-person" medical evaluation and management services. I also informed Ms. Tercero of the availability of  "in-person" appointments. Finally, I informed her that there would be a charge for the virtual visit and that she could be  personally, fully or partially, financially responsible for it. Ms. Ibach expressed understanding and agreed to proceed.   Historic Elements   Ms. Mallori Araque is a 54 y.o. year old, female patient evaluated today after her last contact with our practice on 05/25/2019. Ms. Molock  has a past medical history of Anemia, Gastric bypass status for obesity, H/O unstable angina, Hypoglycemia, MI (myocardial infarction) ([place]), Orthostatic hypotension, Osteoporosis, Sciatic nerve disease, Seizures ([place]), and Thyroid disease. She also  has a past surgical history that includes Gastric bypass; Abdominal hysterectomy; Cholecystectomy; Tonsillectomy; Gastric bypass open; Oophorectomy; Hernia repair; Abdominal adhesion surgery; Gastrostomy w/ feeding tube; Dilation and curettage of uterus; Splenectomy; and Esophagogastroduodenoscopy. Ms. Guinn has a current medication list which includes the following prescription(s): acarbose, alendronate, butalbital-acetaminophen-caffeine, calcium carb-cholecalciferol, cetirizine, clobazam, cyanocobalamin, ergocalciferol, fyavolv, [START ON 07/08/2019] gabapentin, galcanezumab-gnlm, [START ON 07/08/2019] hydrocodone-acetaminophen, [START ON 08/07/2019] hydrocodone-acetaminophen, [START ON 09/06/2019] hydrocodone-acetaminophen, isosorbide dinitrate, levothyroxine, linzess, metoprolol succinate, nitroglycerin, ondansetron, oxcarbazepine, pantoprazole, polyethylene glycol, promethazine, quetiapine, ranolazine, rosuvastatin, [START ON 07/08/2019] tizanidine, topiramate, torsemide, trazodone, zolpidem, fluoxetine, and isosorbide mononitrate. She  reports that she has never smoked. She has never used smokeless tobacco. She reports previous alcohol use. She reports that she does not use drugs. Ms. Cassidy is allergic to depakote [divalproex sodium]; pregabalin; and levetiracetam.    HPI  Today, she is being contacted for medication management. The patient indicates doing well with the current medication regimen. No adverse reactions or side effects reported to the medications.  The patient indicates that she missed her appointment for the procedure because she fell and ended up with a fracture.  Currently she is doing well with regards to her low back pain,  but she is having bilateral knee pain with the right being worse than the left.  She would like to proceed with intra-articular knee joint injections.  We will go ahead and start with a steroid injection and if she continues to have problems we will consider Hyalgan knee injection series.  Today we have addressed the issue that she was using [facility] and due to the unreliability in terms of their opioid analgesic supply, we have switched the patient to a different pharmacy.  She has elected to use the [facility] pharmacy.  Pharmacotherapy Assessment  Analgesic: Hydrocodone/APAP 5/325, 1 tab PO q 8 hrs (15 mg/day of hydrocodone) MME/day:15 mg/day.   Monitoring: [place] PMP: PDMP reviewed during this encounter.       Pharmacotherapy: No side-effects or adverse reactions reported. Compliance: No problems identified. Effectiveness: Clinically acceptable. Plan: Refer to "POC".  UDS:  Summary  Date Value Ref Range Status  03/31/2018 FINAL  Final    Comment:    ==================================================================== [facility] COMP DRUG ANALYSIS,UR ==================================================================== Test                             Result       Flag       Units Drug Present and Declared for Prescription Verification   7-aminoclonazepam              84           EXPECTED   ng/mg creat    7-aminoclonazepam is an expected metabolite of clonazepam. Source    of clonazepam is a scheduled prescription medication.   Butalbital                     PRESENT      EXPECTED   Gabapentin                      PRESENT      EXPECTED   Oxcarbazepine MHD              PRESENT      EXPECTED    Oxcarbazepine MHD is the active metabolite of oxcarbazepine and    eslicarbazepine.   Quetiapine                     PRESENT      EXPECTED   Aripiprazole                   PRESENT      EXPECTED   Acetaminophen                  PRESENT      EXPECTED Drug Present not Declared for Prescription Verification   Baclofen                       PRESENT      UNEXPECTED   Ibuprofen                      PRESENT      UNEXPECTED Drug Absent but Declared for Prescription Verification   Oxycodone                      Not Detected UNEXPECTED ng/mg creat   Tizanidine                     Not Detected UNEXPECTED    Tizanidine,  as indicated in the declared medication list, is not    always detected even when used as directed.   Salicylate                     Not Detected UNEXPECTED    Aspirin, as indicated in the declared medication list, is not    always detected even when used as directed. ==================================================================== Test                      Result    Flag   Units      Ref Range   Creatinine              102              mg/dL      >=20 ==================================================================== Declared Medications:  The flagging and interpretation on this report are based on the  following declared medications.  Unexpected results may arise from  inaccuracies in the declared medications.  **Note: The testing scope of this panel includes these medications:  Aripiprazole (Abilify)  Butalbital (Butalbital/APAP/Caffeine)  Clonazepam (Klonopin)  Gabapentin (Neurontin)  Oxcarbazepine (Trileptal)  Oxycodone (Percocet)  Quetiapine (Seroquel)  **Note: The testing scope of this panel does not include small to  moderate amounts of these reported medications:  Acetaminophen (Butalbital/APAP/Caffeine)  Acetaminophen (Percocet)  Aspirin  Tizanidine (Zanaflex)  **Note: The  testing scope of this panel does not include following  reported medications:  Alendronate (Fosamax)  Caffeine (Butalbital/APAP/Caffeine)  Calcium (Calcium/Cholecalciferol)  Cholecalciferol (Calcium/Cholecalciferol)  Lactulose (Constulose)  Levothyroxine  Nitroglycerin (Nitrostat)  Ondansetron (Zofran)  Prednisone (Sterapred)  Rosuvastatin (Crestor)  Sumatriptan (Imitrex)  Vitamin B12  Vitamin D2 ==================================================================== For clinical consultation, please call [phone]. ====================================================================    Laboratory Chemistry Profile   Renal Lab Results  Component Value Date   BUN 10 05/19/2019   CREATININE 1.23 (H) 05/19/2019   BCR 9 03/31/2018   GFRAA 59 (L) 05/19/2019   GFRNONAA 51 (L) 05/19/2019     Hepatic Lab Results  Component Value Date   AST 19 05/19/2019   ALT 17 05/19/2019   ALBUMIN 3.1 (L) 05/19/2019   ALKPHOS 162 (H) 05/19/2019   LIPASE 28 09/24/2018     Electrolytes Lab Results  Component Value Date   NA 135 05/19/2019   K 3.5 05/19/2019   CL 112 (H) 05/19/2019   CALCIUM 8.1 (L) 05/19/2019   MG 1.6 (L) 05/19/2019     Bone Lab Results  Component Value Date   25OHVITD1 30 03/31/2018   25OHVITD2 25 03/31/2018   25OHVITD3 4.8 03/31/2018     Inflammation (CRP: Acute Phase) (ESR: Chronic Phase) Lab Results  Component Value Date   CRP <1 03/31/2018   ESRSEDRATE 21 03/31/2018   LATICACIDVEN 1.1 05/19/2019       Note: Above Lab results reviewed.  Imaging  MR BRAIN WO CONTRAST CLINICAL DATA:  Seizures.  EXAM: MRI HEAD WITHOUT CONTRAST  TECHNIQUE: Multiplanar, multiecho pulse sequences of the brain and surrounding structures were obtained without intravenous contrast.  COMPARISON:  Head CT May 19, 2019  FINDINGS: Brain: No acute infarction, hemorrhage, hydrocephalus, extra-axial collection or mass lesion.  The brain parenchyma has normal  morphology and signal characteristics. The temporal lobes are symmetric.  There is subtle diffuse high convexity dural thickening better appreciated on FLAIR images.  Vascular: Normal flow voids.  Skull and upper cervical spine: Normal marrow signal.  Sinuses/Orbits: Negative.  IMPRESSION: 1. Subtle diffuse high convexity dural thickening  better appreciated on FLAIR images. This can be seen the setting of recent lumbar puncture or intracranial hypotension. In case of high suspicion for other inflammatory/infectious processes, repeat study with intravenous contrast may be helpful. 2. Otherwise unremarkable MRI of the brain.  Electronically Signed   By: Pedro Earls M.D.   On: 06/07/2019 13:09  Assessment  The primary encounter diagnosis was Chronic pain syndrome. Diagnoses of Chronic knee pain (Bilateral) (L>R), Chronic low back pain (Primary Area of Pain) (Bilateral) (L>R) w/ sciatica, Chronic lower extremity pain (Secondary Area of Pain) (Bilateral) (L>R), Chronic neck pain (Tertiary Area of Pain) (Bilateral) (L>R), Chronic musculoskeletal pain, Pharmacologic therapy, and Neurogenic pain were also pertinent to this visit.  Plan of Care  Problem-specific:  No problem-specific Assessment & Plan notes found for this encounter.  Ms. Maleigh Bagot has a current medication list which includes the following long-term medication(s): acarbose, calcium carb-cholecalciferol, cetirizine, clobazam, fyavolv, [START ON 07/08/2019] gabapentin, [START ON 07/08/2019] hydrocodone-acetaminophen, [START ON 08/07/2019] hydrocodone-acetaminophen, [START ON 09/06/2019] hydrocodone-acetaminophen, isosorbide dinitrate, levothyroxine, linzess, metoprolol succinate, nitroglycerin, pantoprazole, promethazine, ranolazine, rosuvastatin, [START ON 07/08/2019] tizanidine, topiramate, torsemide, trazodone, and zolpidem.  Pharmacotherapy (Medications Ordered): Meds ordered this encounter  Medications   . tiZANidine (ZANAFLEX) 4 MG tablet    Sig: Take 1 tablet (4 mg total) by mouth 3 (three) times daily.    Dispense:  90 tablet    Refill:  5    Fill one day early if pharmacy is closed on scheduled refill date. May substitute for generic if available.  Marland Kitchen HYDROcodone-acetaminophen (NORCO/VICODIN) 5-325 MG tablet    Sig: Take 1 tablet by mouth every 8 (eight) hours as needed for severe pain. Must last 30 days    Dispense:  90 tablet    Refill:  0    Chronic Pain: STOP Act (Not applicable) Fill 1 day early if closed on refill date. Do not fill until: 07/08/2019. To last until: 08/07/2019. Avoid benzodiazepines within 8 hours of opioids  . HYDROcodone-acetaminophen (NORCO/VICODIN) 5-325 MG tablet    Sig: Take 1 tablet by mouth every 8 (eight) hours as needed for severe pain. Must last 30 days    Dispense:  90 tablet    Refill:  0    Chronic Pain: STOP Act (Not applicable) Fill 1 day early if closed on refill date. Do not fill until: 08/07/2019. To last until: 09/06/2019. Avoid benzodiazepines within 8 hours of opioids  . HYDROcodone-acetaminophen (NORCO/VICODIN) 5-325 MG tablet    Sig: Take 1 tablet by mouth every 8 (eight) hours as needed for severe pain. Must last 30 days    Dispense:  90 tablet    Refill:  0    Chronic Pain: STOP Act (Not applicable) Fill 1 day early if closed on refill date. Do not fill until: 09/06/2019. To last until: 10/06/2019. Avoid benzodiazepines within 8 hours of opioids  . gabapentin (NEURONTIN) 400 MG capsule    Sig: Take 1 capsule (400 mg total) by mouth 4 (four) times daily.    Dispense:  120 capsule    Refill:  5    Fill one day early if pharmacy is closed on scheduled refill date. May substitute for generic if available.   Orders:  Orders Placed This Encounter  Procedures  . KNEE INJECTION    Local Anesthetic & Steroid injection.    Standing Status:   Future    Standing Expiration Date:   08/05/2019    Scheduling Instructions:     Side: Bilateral  Sedation: None     Timeframe: As soon as schedule allows    Order Specific Question:   Where will this procedure be performed?    Answer:   [facility]  . ToxASSURE Select 13 (MW), Urine    Volume: 30 ml(s). Minimum 3 ml of urine is needed. Document temperature of fresh sample. Indications: Long term (current) use of opiate analgesic (U98.119)    Order Specific Question:   Release to patient    Answer:   Immediate   Follow-up plan:   Return in about 3 months (around 10/05/2019) for (F2F), (MM), in addition, Procedure (no sedation): (B) IA Knee inj. #1 (Steroid).      Interventional management options:  Considering:   Diagnostic/therapeutic bilateral IA knee injection (steroid) #1  Diagnostic left L4-5 LESI  Diagnostic midline caudal ESI + epidurogram  Possible bilateral lumbar facetRFA Diagnostic bilateral occipital NB Possible bilateral occipital nerveRFA Diagnostic bilateral cervical facet block Possible bilateral cervical facetRFA Diagnostic left SI joint injection Possible left SI jointRFA   Palliative PRN treatment(s):   Diagnostic bilateral lumbar facet block #2    Recent Visits No visits were found meeting these conditions.  Showing recent visits within past 90 days and meeting all other requirements   Future Appointments No visits were found meeting these conditions.  Showing future appointments within next 90 days and meeting all other requirements   I discussed the assessment and treatment plan with the patient. The patient was provided an opportunity to ask questions and all were answered. The patient agreed with the plan and demonstrated an understanding of the instructions.  Patient advised to call back or seek an in-person evaluation if the symptoms or condition worsens.  Duration of encounter: 13 minutes.  Note by: Gaspar Cola, MD Date: 07/06/2019; Time: 9:09 AM

## 2019-07-06 ENCOUNTER — Ambulatory Visit: Payer: Medicaid Other | Attending: Pain Medicine | Admitting: Pain Medicine

## 2019-07-06 DIAGNOSIS — G8929 Other chronic pain: Secondary | ICD-10-CM

## 2019-07-06 DIAGNOSIS — M25562 Pain in left knee: Secondary | ICD-10-CM | POA: Diagnosis not present

## 2019-07-06 DIAGNOSIS — M5442 Lumbago with sciatica, left side: Secondary | ICD-10-CM | POA: Diagnosis not present

## 2019-07-06 DIAGNOSIS — G894 Chronic pain syndrome: Secondary | ICD-10-CM

## 2019-07-06 DIAGNOSIS — M25561 Pain in right knee: Secondary | ICD-10-CM

## 2019-07-06 DIAGNOSIS — M5441 Lumbago with sciatica, right side: Secondary | ICD-10-CM

## 2019-07-06 DIAGNOSIS — M7918 Myalgia, other site: Secondary | ICD-10-CM

## 2019-07-06 DIAGNOSIS — Z79899 Other long term (current) drug therapy: Secondary | ICD-10-CM

## 2019-07-06 DIAGNOSIS — M79604 Pain in right leg: Secondary | ICD-10-CM

## 2019-07-06 DIAGNOSIS — M542 Cervicalgia: Secondary | ICD-10-CM

## 2019-07-06 DIAGNOSIS — M792 Neuralgia and neuritis, unspecified: Secondary | ICD-10-CM

## 2019-07-06 DIAGNOSIS — M79605 Pain in left leg: Secondary | ICD-10-CM

## 2019-07-06 MED ORDER — GABAPENTIN 400 MG PO CAPS: 400.0000 mg | ORAL_CAPSULE | Freq: Four times a day (QID) | ORAL | 5 refills | Status: DC

## 2019-07-06 MED ORDER — HYDROCODONE-ACETAMINOPHEN 5-325 MG PO TABS: 1.0000 | ORAL_TABLET | Freq: Three times a day (TID) | ORAL | 0 refills | Status: DC | PRN

## 2019-07-06 MED ORDER — TIZANIDINE HCL 4 MG PO TABS: 4.0000 mg | ORAL_TABLET | Freq: Three times a day (TID) | ORAL | 5 refills | Status: DC

## 2019-07-06 NOTE — Patient Instructions (Signed)
____________________________________________________________________________________________  Preparing for your procedure (without sedation)  Procedure appointments are limited to planned procedures: . No Prescription Refills. . No disability issues will be discussed. . No medication changes will be discussed.  Instructions: . Oral Intake: Do not eat or drink anything for at least 3 hours prior to your procedure. (Exception: Blood Pressure Medication. See below.) . Transportation: Unless otherwise stated by your physician, you may drive yourself after the procedure. . Blood Pressure Medicine: Do not forget to take your blood pressure medicine with a sip of water the morning of the procedure. If your Diastolic (lower reading)is above 100 mmHg, elective cases will be cancelled/rescheduled. . Blood thinners: These will need to be stopped for procedures. Notify our staff if you are taking any blood thinners. Depending on which one you take, there will be specific instructions on how and when to stop it. . Diabetics on insulin: Notify the staff so that you can be scheduled 1st case in the morning. If your diabetes requires high dose insulin, take only  of your normal insulin dose the morning of the procedure and notify the staff that you have done so. . Preventing infections: Shower with an antibacterial soap the morning of your procedure.  . Build-up your immune system: Take 1000 mg of Vitamin C with every meal (3 times a day) the day prior to your procedure. . Antibiotics: Inform the staff if you have a condition or reason that requires you to take antibiotics before dental procedures. . Pregnancy: If you are pregnant, call and cancel the procedure. . Sickness: If you have a cold, fever, or any active infections, call and cancel the procedure. . Arrival: You must be in the facility at least 30 minutes prior to your scheduled procedure. . Children: Do not bring any children with you. . Dress  appropriately: Bring dark clothing that you would not mind if they get stained. . Valuables: Do not bring any jewelry or valuables.  Reasons to call and reschedule or cancel your procedure: (Following these recommendations will minimize the risk of a serious complication.) . Surgeries: Avoid having procedures within 2 weeks of any surgery. (Avoid for 2 weeks before or after any surgery). . Flu Shots: Avoid having procedures within 2 weeks of a flu shots or . (Avoid for 2 weeks before or after immunizations). . Barium: Avoid having a procedure within 7-10 days after having had a radiological study involving the use of radiological contrast. (Myelograms, Barium swallow or enema study). . Heart attacks: Avoid any elective procedures or surgeries for the initial 6 months after a "Myocardial Infarction" (Heart Attack). . Blood thinners: It is imperative that you stop these medications before procedures. Let us know if you if you take any blood thinner.  . Infection: Avoid procedures during or within two weeks of an infection (including chest colds or gastrointestinal problems). Symptoms associated with infections include: Localized redness, fever, chills, night sweats or profuse sweating, burning sensation when voiding, cough, congestion, stuffiness, runny nose, sore throat, diarrhea, nausea, vomiting, cold or Flu symptoms, recent or current infections. It is specially important if the infection is over the area that we intend to treat. . Heart and lung problems: Symptoms that may suggest an active cardiopulmonary problem include: cough, chest pain, breathing difficulties or shortness of breath, dizziness, ankle swelling, uncontrolled high or unusually low blood pressure, and/or palpitations. If you are experiencing any of these symptoms, cancel your procedure and contact your primary care physician for an evaluation.  Remember:  Regular   Business hours are:  Monday to Thursday 8:00 AM to 4:00  PM  Provider's Schedule: Lavoris Canizales, MD:  Procedure days: Tuesday and Thursday 7:30 AM to 4:00 PM  Bilal Lateef, MD:  Procedure days: Monday and Wednesday 7:30 AM to 4:00 PM ____________________________________________________________________________________________    

## 2019-07-11 ENCOUNTER — Telehealth: Payer: Self-pay | Admitting: Pain Medicine

## 2019-07-14 NOTE — Telephone Encounter (Signed)
error 

## 2019-07-22 LAB — TOXASSURE SELECT 13 (MW), URINE

## 2019-08-02 ENCOUNTER — Ambulatory Visit (HOSPITAL_BASED_OUTPATIENT_CLINIC_OR_DEPARTMENT_OTHER): Payer: Medicaid Other | Admitting: Pain Medicine

## 2019-08-02 DIAGNOSIS — G894 Chronic pain syndrome: Secondary | ICD-10-CM

## 2019-08-02 NOTE — Progress Notes (Signed)
NO SHOW

## 2019-08-02 NOTE — Patient Instructions (Signed)

## 2019-08-16 ENCOUNTER — Other Ambulatory Visit
Admission: RE | Admit: 2019-08-16 | Discharge: 2019-08-16 | Disposition: A | Discharge status: Home / Self Care | Payer: Medicaid Other | Source: Ambulatory Visit | Attending: Neurology | Admitting: Neurology

## 2019-08-16 DIAGNOSIS — F5104 Psychophysiologic insomnia: Secondary | ICD-10-CM | POA: Insufficient documentation

## 2019-08-16 DIAGNOSIS — R569 Unspecified convulsions: Secondary | ICD-10-CM | POA: Insufficient documentation

## 2019-08-16 DIAGNOSIS — G43019 Migraine without aura, intractable, without status migrainosus: Secondary | ICD-10-CM | POA: Insufficient documentation

## 2019-08-16 DIAGNOSIS — R410 Disorientation, unspecified: Secondary | ICD-10-CM | POA: Diagnosis present

## 2019-08-16 LAB — AMMONIA: Ammonia: 64 umol/L — ABNORMAL HIGH (ref 9–35)

## 2019-08-26 DIAGNOSIS — G40919 Epilepsy, unspecified, intractable, without status epilepticus: Secondary | ICD-10-CM | POA: Insufficient documentation

## 2019-09-27 NOTE — Progress Notes (Signed)
No show

## 2019-09-28 ENCOUNTER — Ambulatory Visit (HOSPITAL_BASED_OUTPATIENT_CLINIC_OR_DEPARTMENT_OTHER): Payer: Medicaid Other | Admitting: Pain Medicine

## 2019-09-28 DIAGNOSIS — G894 Chronic pain syndrome: Secondary | ICD-10-CM

## 2019-10-22 IMAGING — CR DG ANKLE COMPLETE 3+V*L*
1 series · 3 of 3 positions shown · non-contrast
Comparison: None.

CLINICAL DATA: Syncopal episode with fall injuring ankles.

EXAM:
LEFT ANKLE COMPLETE - 3+ VIEW

[Series 1: dg ankle complete left · 0.14mm/px · 3 of 3 slices shown]
[im 1/3]
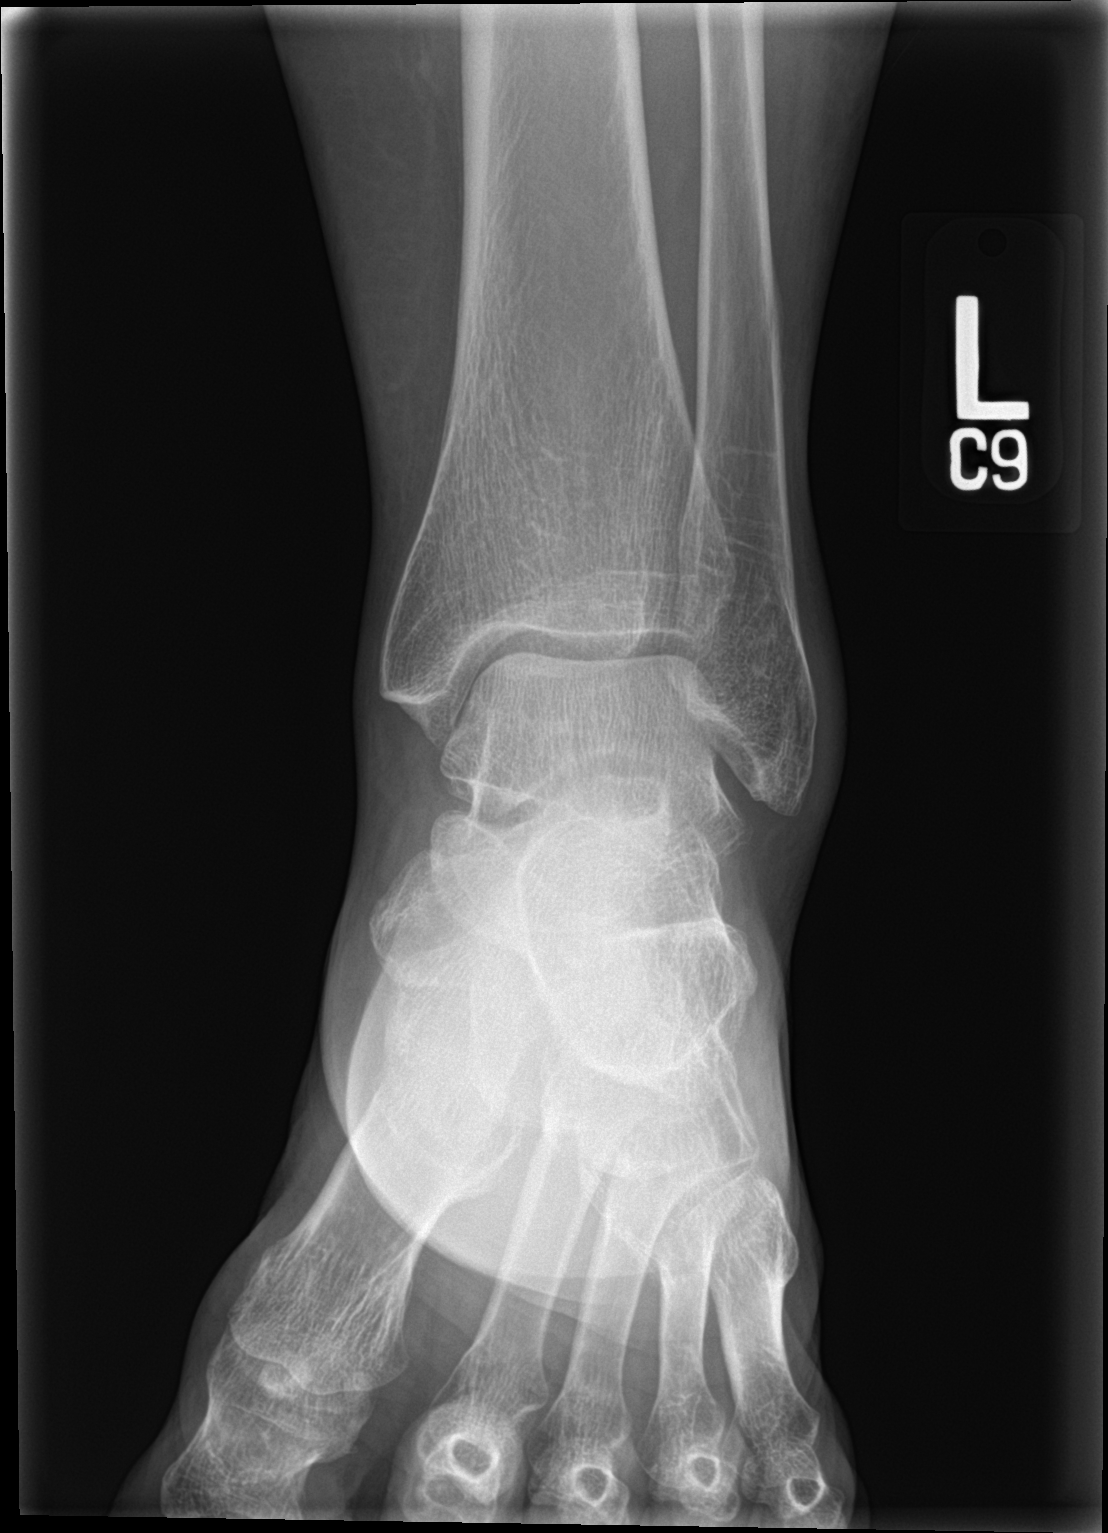
[im 2/3]
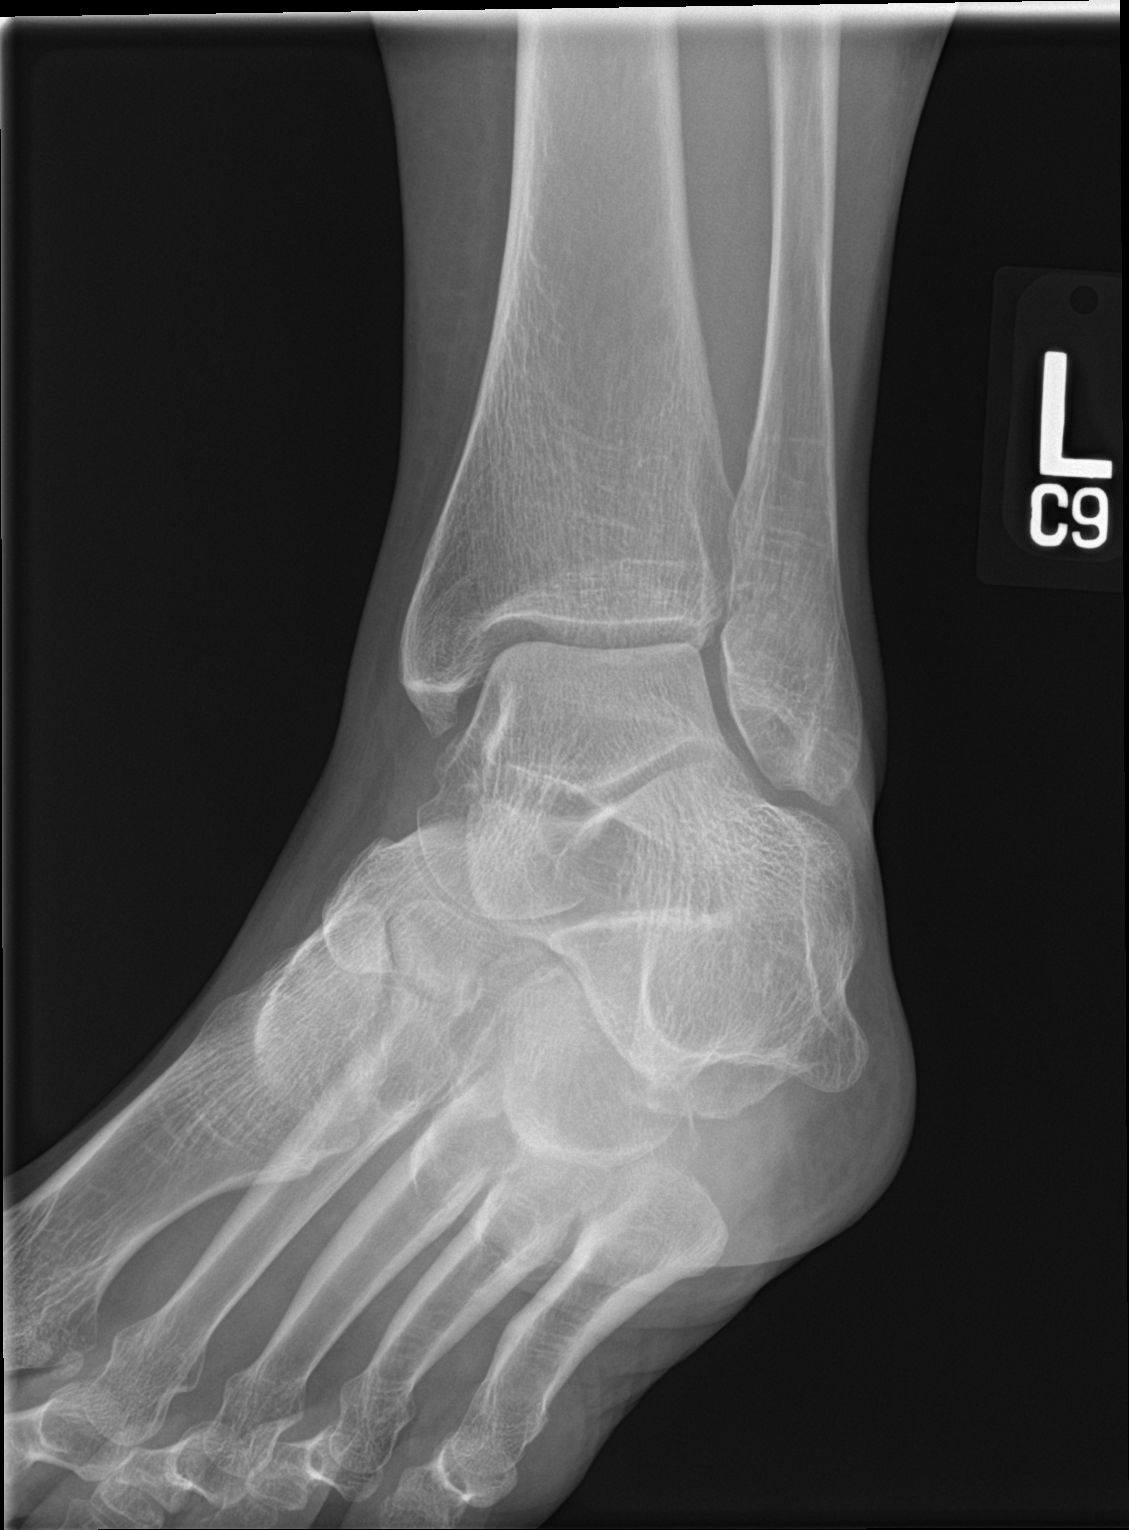
[im 3/3]
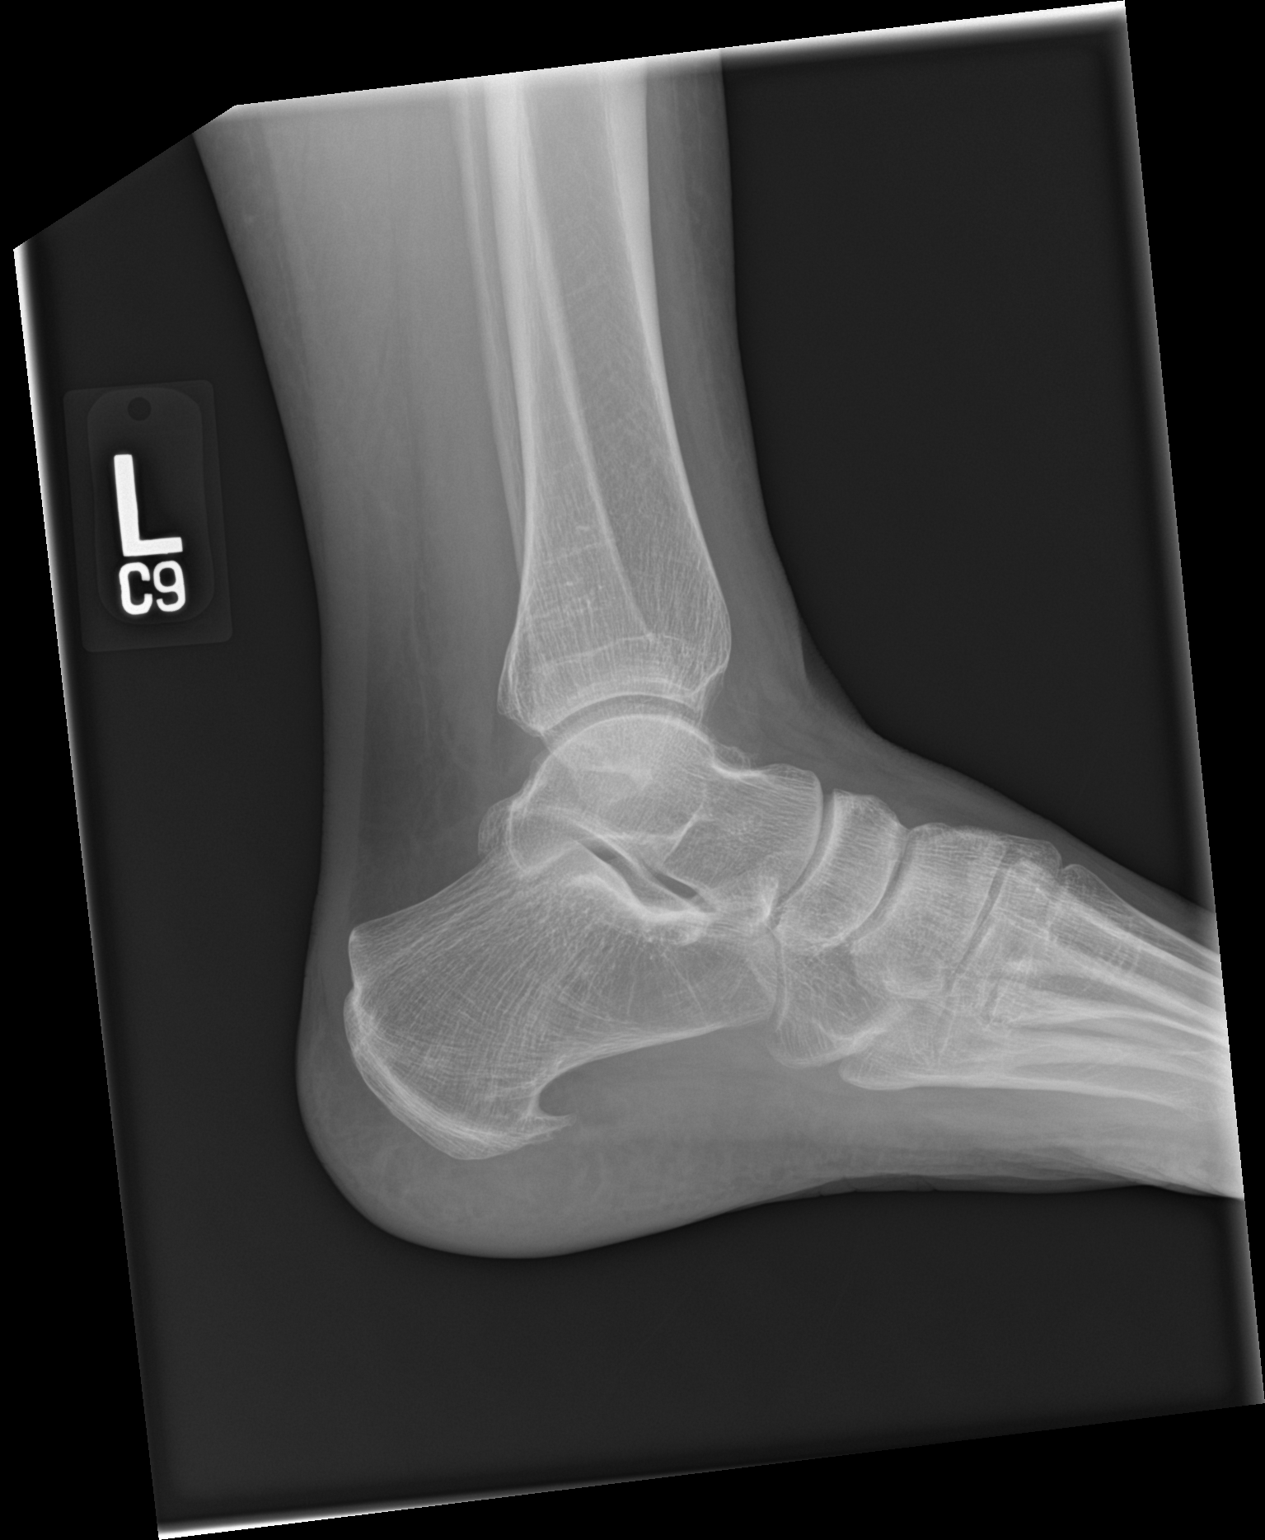

[3 of 3 positions shown; findings below may reference images not displayed]

FINDINGS: Ankle mortise is normal. There is no evidence of acute fracture or
dislocation. Small inferior calcaneal spur.
IMPRESSION: No acute fracture.

## 2019-11-08 ENCOUNTER — Other Ambulatory Visit: Payer: Self-pay | Admitting: Pain Medicine

## 2019-11-08 DIAGNOSIS — G894 Chronic pain syndrome: Secondary | ICD-10-CM

## 2020-08-10 DIAGNOSIS — I2510 Atherosclerotic heart disease of native coronary artery without angina pectoris: Secondary | ICD-10-CM

## 2020-08-10 HISTORY — DX: Atherosclerotic heart disease of native coronary artery without angina pectoris: I25.10

## 2020-09-13 ENCOUNTER — Encounter: Payer: Self-pay | Admitting: Counselor

## 2020-11-22 ENCOUNTER — Ambulatory Visit (INDEPENDENT_AMBULATORY_CARE_PROVIDER_SITE_OTHER): Payer: Medicaid Other | Admitting: Counselor

## 2020-11-22 ENCOUNTER — Ambulatory Visit: Payer: Medicaid Other

## 2020-11-22 ENCOUNTER — Encounter: Payer: Self-pay | Admitting: Counselor

## 2020-11-22 ENCOUNTER — Other Ambulatory Visit: Payer: Self-pay

## 2020-11-22 DIAGNOSIS — R419 Unspecified symptoms and signs involving cognitive functions and awareness: Secondary | ICD-10-CM

## 2020-11-22 DIAGNOSIS — F09 Unspecified mental disorder due to known physiological condition: Secondary | ICD-10-CM

## 2020-11-22 DIAGNOSIS — Z8659 Personal history of other mental and behavioral disorders: Secondary | ICD-10-CM
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
## 2020-11-22 NOTE — Progress Notes (Signed)
   Psychometrist Note   Cognitive testing was administered to Diana Quinn by Lamar Benes, B.S. (Technician) under the supervision of Alphonzo Severance, Psy.D., ABN. Diana Quinn was able to tolerate all test procedures. Dr. Laticha Kindred met with the patient as needed to manage any emotional reactions to the testing procedures. Rest breaks were offered.    The battery of tests administered was selected by Dr. Cortasia Kindred with consideration to the patient's current level of functioning, the nature of her symptoms, emotional and behavioral responses during the interview, level of literacy, observed level of motivation/effort, and the nature of the referral question. This battery was communicated to the psychometrist. Communication between Dr. Toluwanimi Kindred and the psychometrist was ongoing throughout the evaluation and Dr. Nimisha Kindred was immediately accessible at all times. Dr. Lillyanna Kindred provided supervision to the technician on the date of this service, to the extent necessary to assure the quality of all services provided.    Diana Quinn will return in approximately one week for an interactive feedback session with Dr. Madiha Kindred, at which time test performance, clinical impressions, and treatment recommendations will be reviewed in detail. The patient understands she can contact our office should she require our assistance before this time.   A total of 165 minutes of billable time were spent with Diana Quinn by the technician, including test administration and scoring time. Billing for these services is reflected in Dr. Les Pou note.   This note reflects time spent with the psychometrician and does not include test scores, clinical history, or any interpretations made by Dr. Ruhani Kindred. The full report will follow in a separate note.

## 2020-11-23 NOTE — Progress Notes (Signed)
NEUROPSYCHOLOGICAL TEST SCORES [facility]  Patient Name: Diana Quinn MRN: [id number] Date of Birth: [birth year] Age: 54 y.o. Education: 11 years (+ GED)  Measurement properties of test scores: IQ, Index, and Standard Scores (SS): Mean = 100; Standard Deviation = 15 Scaled Scores (Ss): Mean = 10; Standard Deviation = 3 Z scores (Z): Mean = 0; Standard Deviation = 1 T scores (T); Mean = 50; Standard Deviation = 10  TEST SCORES:    Note: This summary of test scores accompanies the interpretive report and should not be interpreted by unqualified individuals or in isolation without reference to the report. Test scores are relative to age, gender, and educational history as available and appropriate.   Performance Validity        ACS: Raw Descriptor      Word Choice: 39 Below Expectation      The Dot Counting Test: Raw Descriptor      E-Score 14 Within Expectation      Embedded Measures: Raw Descriptor      RBANS Effort Index: 3 Within Expectation      WAIS-IV Reliable Digit Span 7 Within Expectation      WAIS-IV Reliable Digit Span Revised 11 Within Expectation      Expected Functioning        Wide Range Achievement Test (Word Reading): Standard/Scaled Score Percentile       Word Reading 93 32      Reynolds Intellectual Screening Test Standard/T-score Percentile      Guess What 53 62      Odd Item Out 51 54  RIST Index 104 61      Cognitive Testing        RBANS, Form : Standard/Scaled Score Percentile  Total Score 73 4  Immediate Memory 73 4      List Learning 4 2      Story Memory 6 9  Visuospatial/Constructional 100 50      Figure Copy   (20) 13 84      Judgment of Line Orientation   (13) --- 10-16  Language 82 12      Picture Naming --- 51-75      Semantic Fluency 4 2  Attention 56 <1      Digit Span 4 2      Coding 3 1  Delayed Memory 81 10      List Recall   (5) --- 26-50      List Recognition   (18) --- 10-16      Story Recall   (7) 7 16       Figure Recall   (9) 6 9      Wechsler Adult Intelligence Scale - IV: Standard/Scaled Score Percentile  Working Memory Index 80 9      Digit Span 6 9          Digit Span Forward 6 9          Digit Span Backward 7 16          Digit Span Sequencing 8 25      Arithmetic 7 16  Processing Speed Index 59 <1      Symbol Search 2 <1      Coding 3 1      Neuropsychological Assessment Battery (Language Module): T-score Percentile      Naming   (31) 56 73      Verbal Fluency: T-score Percentile      Controlled Oral Word Association (F-A-S) 21 <1  Semantic Fluency (Animals) 29 2      Trail Making Test: T-Score Percentile      Part A 37 9      Part B 35 7      Modified [place] Card Sorting Test (MWCST): Standard/T-Score Percentile      Number of Categories Correct 19 <1      Number of Perseverative Errors 37 9      Number of Total Errors 31 3      Percent Perseverative Errors 51 54  Executive Function Composite 64 1      Boston Diagnostic Aphasia Exam: Raw Score Scaled Score      Complex Ideational Material 11 9      Rating Scales         Raw Score Descriptor  Patient Health Questionnaire - 9 7 Mild  GAD-7 18 Severe   Viviene Thurston V. Roseanne Reno PsyD, ABN Clinical Neuropsychologist

## 2020-11-26 NOTE — Progress Notes (Signed)
NEUROPSYCHOLOGICAL EVALUATION [facility]  Patient Name: Diana Quinn MRN: [id number] Date of Birth: [birth year] Age: 55 y.o. Education: 11 years (+ GED)  Clinical Impressions  Diana Quinn is a 55 y.o., left-hand dominant, engaged woman with a complex medical and psychiatric history including epilepsy (diagnosed by Dr. Sherryll Burger in 2013), HTN, HLD, schizoaffective disorder (bipolar type) and memory and thinking problems more recently. She started having seizures in 2013 although they were well controlled until around 2019-2020 when she moved back to [place] from [facility]. At that time, she started having events more frequently and also started appreciating changes in cognition. Her husband describes her as highly forgetful, inattentive, circumstantial in conversations, and impulsive. Qualitatively, she presented as somewhat childlike at times.   On neuropsychological assessment, Diana Quinn demonstrated less than expected performance in several areas with primary problems on measures of processing speed, executive function, and some scattered low scores on measures of working memory. She also demonstrated weak memory encoding, which is likely secondary to the above issues. Her delayed recall performance was good, however, and she does not have a memory storage problem. She performed well on measures of visuospatial/constructional abilities and fundamental language abilities. She reported mild yet potentially significant levels of depressive symptoms and a severe level of anxiety symptoms.   The patient's presentation and performance is consistent with a mild level of cognitive dysfunction. While she is on more than enough cognitively impairing medications to explain her test findings, she and her husband report that her difficulties have been worsening over time. I am unsure that there is a sole underlying cause and suspect that her cognitive impairment is in fact multifactorial. Poorly controlled epilepsy  can certainly be associated with progressive cognitive decline, although decline typically occurs over the course of several decades as opposed to several years. She also has no known episodes of status epilepticus or prolonged seizures. 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 Of course her history of significant mental illness, disordered sleep schedule/insufficient sleep and other factors may be contributory. I do not think she has a neurodegenerative condition given her test data and base rates of those conditions at her age, which are exceedingly low. Would recommend repeat evaluation in 12 to 24 months and ongoing management of possible contributing factors  as is already being done.    Diagnostic Impressions: Mild neurocognitive disorder due to multiple etiologies Schizoaffective disorder, bipolar type (by history)  Recommendations to be discussed with patient  Your performance and presentation on assessment were consistent with less than expected performance on measures of processing speed, executive function, and also working memory/attention.  These difficulties in turn affected your memory performance, which shows difficulties getting information in yet good retention of information over time.  In the context of your clinical history these findings are consistent with a mild neurocognitive disorder.  Mild neurocognitive disorder is a term for cognitive difficulties that are noticeable and can be demonstrated on neuropsychological testing but do not appreciably impair your capacity for independent functioning.  Prognosis is dependent upon the underlying cause of the neurocognitive disorder.  If the mild neurocognitive disorder is due to  something like Alzheimer's disease, then there will be progression over time, but if it is due to something else and there may  not be.  Some people with mild neurocognitive disorder you have been returned to normal after period of time.  In your case, I am not sure I am not sure what is causing your mild neurocognitive disorder and think it may be due to several different things.  First, you reported that you have noticed most of your cognitive decline over the past 2 to 3 years, a period during which your seizures have not been well controlled. One possibility is that what ever underlying issues giving rise to seizures is also causing cognitive impairment. Another possibility is that the seizures in and of themselves are causing cognitive problems. Typically, cognitive impairment is experienced by individuals with poorly controlled recurrent seizures, although this typically takes many years. I think that medication side effects, ongoing interference from your schizoaffective disorder, and fatigue due to insufficient sleep are also likely contributing.  First, you should know that many of the medications you are on are cognitively impairing. Medications such as clobazam, lumateperone, oxcarbazepine, topiramate, and most other medications used to treat epilepsy can be cognitively impairing.  Control of seizures is important, however, and you should know that it may not be possible to adequately control your seizures or other conditions without some cognitive side effects.  Second, you are not getting good sleep.  It sounds like your sleep schedule is somewhat disturbed and that may be a major contributor.  There are few things as disruptive to brain functioning as not getting a good night's sleep. For sleep, I recommend against using medications, which can have lingering sedating effects on the brain and rob your brain of restful REM sleep. Instead, consider trying some of the following sleep hygiene  recommendations. They may not work at once and may take effort, but the effort you spend is likely to be rewarded with better sleep eventually:  Stick to a sleep schedule of the same bedtime and wake time even on the weekends, which can help to regulate your body's internal clock so that you fall asleep and stay asleep.  Practice a relaxing bedtime ritual (conducted away from bright lights) which will help separate your sleep from stimulating activities and prepare your body to fall asleep when you go to bed.  Avoid naps, especially in the afternoon.  Evaluate your room and create conditions that will promote sleep such as keeping it cool (between 60 - 67 degrees), quiet, and free from any lights. Consider using blackout curtains, a "white noise" generator, or fan that will help mask any noises that might prevent you from going to sleep or awaken you during the night.  Sleep on a comfortable mattress and pillows.  Avoid bright light in the evening and excessive use of portable electronic devices right before bed that may contain light frequencies that can contribute to sleep problems.  Avoid alcohol, cigarettes, or heavy meals in the evening. If you must eat, consume a light snack 45 minutes before bed.  Use your bed only for sleep to strengthen the association between your bed and sleep.  If you can't go to sleep within 30 minutes, go into another room and do something relaxing until you feel tired. Then, come back and try to go to sleep again for 30 minutes and repeat until sleep is achieved.  Some people find over the counter melatonin to be helpful for sleep, which you could discuss with a pharmacist or prescribing provider.   Third, your seizures themselves may be causing cognitive impairment. Often times individuals with status  epilepticus or prolonged uncontrolled seizures over long periods of time can have cognitive difficulties as a result. To my knowledge, you do not have any episodes of  status epilepticus and have never had any very prolonged seizures, which is positive. Work actively to manage your epilepsy condition, which will minimize the risk of such episodes, which can contribute to impaired cognitive status.   I would like to reassure you that I do not think that you have dementia. Your test data is inconsistent with the most common form of dementia, Alzheimer's disease, and I do not see compelling indications of any other type of dementia. I would like you to know that the frequency of dementia in individuals your age is extremely low. A recent meta analysis found that the global age-standardized prevalence of dementia is approximately 1.19 per 1000 individuals from 61 to 70 years of age, the vast majority of which is in the 42 to 31 age range. To put that into perspective, your odds of dying in a motor vehicle collision are about 1 in 107, your odds of dying from falling are about 1 in 106, and your odds of dying from drowning the odds are about 1 in 1,128 [facility] [employer], 2019). Simply going off your age with no other information, it is not very likely that your issues are due to early onset dementia.   You can return for repeat evaluation in 2 years or sooner as clinically indicated, which can allow [place] to monitor your progress over time.  Test Findings  Test scores are summarized in additional documentation associated with this encounter. Test scores are relative to age, gender, and educational history as available and appropriate. There were no concerns about performance validity as all findings fell within normal expectations.   General Intellectual Functioning/Achievement:  Performance on single word reading was at the lower end of the average range. Performance on the RIST index was also in the average range, with comparable average range performance on both the visually and verbally oriented subtests.  Attention and Processing  Efficiency: Performance on the working memory index of the WAIS-IV was low average overall. Digit repetition forward was extremely low to unusually low, where digit repetition backward and digit reach sequencing in ascending order were low average to average. Mental solving of arithmetical word problems without paper and pencil was low average.  With respect to processing speed, overall performance was extremely low on the relevant WAIS-IV index. Performance was extremely low on 2 different timed number-symbol coding procedures and on 1 measure of efficient visual matching and efficient visual scanning.  Language: Visual object confrontation naming was errorless. By contrast, she had difficulty on timed measures of verbal fluency, with extremely low scores in response to category prompts and phonemic prompts.  Visuospatial Function: Visuospatial/constructional functioning fell at an average level on the relevant RBANS index. Figure copy was errorless where his judgment of angular line orientations was low average.  Learning and Memory: Performance on measures of learning and memory showed memory encoding problems. My sense is that these are likely on the basis of executive control, attention, and processing speed factors that attenuated memory performance. The findings are not consistent with a storage problem, and instead suggest relatively good retention of information over time.  In the verbal realm, immediate recall for a 10 item word list was 2, 4, 6, and 8 words across 4 learning trials was extremely low. Nevertheless, she retained these words well, achieving an average delayed recallscore with  5 words. Recognition for the words from the list versus false choices was low average. Memory for a short story was unusually low on immediate recall yet once again, retention of information was good, and delayed recall was low average.  In the visual realm, delayed recall of a modestly complex figure  was unusually low.  Executive Functions: Performance was low on most executive measures, and the overall profile of test findings showing difficulties with working memory and processing speed (other facets of executive function) is concerning for executive control problems. Alternating sequencing numbers and letters of the alphabet on the challenging Trail Making Test B was unusually low. Generation of words in response to the letters F-A-S was extremely low. She had a difficult time with the modified [place] card sorting test, which yielded an extremely low score for the executive function composite.  Her number of perseverative errors was low average psychometrically speaking, her categories completed score was extremely low. This suggests she did not have an overly rigid approach to the task, although she did have a low level of success.   Rating Scale(s): Ms. Skarzynski screened in the mild range for depressive symptoms on self-rating.  She reported a severe level of anxiety symptoms.  Bettye Boeck Roseanne Reno, PsyD, ABN Clinical Neuropsychologist  Coding and Compliance  Billing below reflects technician time, my direct face-to-face time with the patient, time spent in test administration, and time spent in professional activities including but not limited to: neuropsychological test interpretation, integration of neuropsychological test data with clinical history, report preparation, treatment planning, care coordination, and review of diagnostically pertinent medical history or studies.   Services associated with this encounter: Clinical Interview [id number]) plus ([id number]; Neuropsychological Evaluation by Professional)  165 minutes ([id number]; Neuropsychological Testing by Technician)

## 2020-11-29 ENCOUNTER — Encounter: Payer: Medicaid Other | Admitting: Counselor

## 2020-11-29 ENCOUNTER — Ambulatory Visit (INDEPENDENT_AMBULATORY_CARE_PROVIDER_SITE_OTHER): Payer: Medicaid Other | Admitting: Counselor

## 2020-11-29 ENCOUNTER — Other Ambulatory Visit: Payer: Self-pay

## 2020-11-29 ENCOUNTER — Encounter: Payer: Self-pay | Admitting: Counselor

## 2020-11-29 DIAGNOSIS — G3184 Mild cognitive impairment, so stated: Secondary | ICD-10-CM | POA: Diagnosis not present

## 2020-11-29 NOTE — Patient Instructions (Signed)
Your performance and presentation on assessment were consistent with less than expected performance on measures of processing speed, executive function, and also working memory/attention.  These difficulties in turn affected your memory performance, which shows difficulties getting information in yet good retention of information over time.  In the context of your clinical history these findings are consistent with a mild neurocognitive disorder.  Mild neurocognitive disorder is a term for cognitive difficulties that are noticeable and can be demonstrated on neuropsychological testing but do not appreciably impair your capacity for independent functioning.  Prognosis is dependent upon the underlying cause of the neurocognitive disorder.  If the mild neurocognitive disorder is due to something like Alzheimer's disease, then there will be progression over time, but if it is due to something else and there may not be.  Some people with mild neurocognitive disorder you have been returned to normal after period of time.  In your case, I am not sure I am not sure what is causing your mild neurocognitive disorder and think it may be due to several different things.  First, you reported that you have noticed most of your cognitive decline over the past 2 to 3 years, a period during which your seizures have not been well controlled. One possibility is that what ever underlying issues giving rise to seizures is also causing cognitive impairment. Another possibility is that the seizures in and of themselves are causing cognitive problems. Typically, cognitive impairment is experienced by individuals with poorly controlled recurrent seizures, although this typically takes many years. I think that medication side effects, ongoing interference from your schizoaffective disorder, and fatigue due to insufficient sleep are also likely contributing.  First, you should know that many of the medications you are on are cognitively  impairing. Medications such as clobazam, lumateperone, oxcarbazepine, topiramate, and most other medications used to treat epilepsy can be cognitively impairing.  Control of seizures is important, however, and you should know that it may not be possible to adequately control your seizures or other conditions without some cognitive side effects.  Second, you are not getting good sleep.  It sounds like your sleep schedule is somewhat disturbed and that may be a major contributor.  There are few things as disruptive to brain functioning as not getting a good night's sleep. For sleep, I recommend against using medications, which can have lingering sedating effects on the brain and rob your brain of restful REM sleep. Instead, consider trying some of the following sleep hygiene recommendations. They may not work at once and may take effort, but the effort you spend is likely to be rewarded with better sleep eventually:   Stick to a sleep schedule of the same bedtime and wake time even on the weekends, which can help to regulate your body's internal clock so that you fall asleep and stay asleep.  Practice a relaxing bedtime ritual (conducted away from bright lights) which will help separate your sleep from stimulating activities and prepare your body to fall asleep when you go to bed.  Avoid naps, especially in the afternoon.  Evaluate your room and create conditions that will promote sleep such as keeping it cool (between 60 - 67 degrees), quiet, and free from any lights. Consider using blackout curtains, a "white noise" generator, or fan that will help mask any noises that might prevent you from going to sleep or awaken you during the night.  Sleep on a comfortable mattress and pillows.  Avoid bright light in the evening and excessive  use of portable electronic devices right before bed that may contain light frequencies that can contribute to sleep problems.  Avoid alcohol, cigarettes, or heavy meals in the  evening. If you must eat, consume a light snack 45 minutes before bed.  Use your bed only for sleep to strengthen the association between your bed and sleep.  If you can't go to sleep within 30 minutes, go into another room and do something relaxing until you feel tired. Then, come back and try to go to sleep again for 30 minutes and repeat until sleep is achieved.  Some people find over the counter melatonin to be helpful for sleep, which you could discuss with a pharmacist or prescribing provider.    Third, your seizures themselves may be causing cognitive impairment. Often times individuals with status epilepticus or prolonged uncontrolled seizures over long periods of time can have cognitive difficulties as a result. To my knowledge, you do not have any episodes of status epilepticus and have never had any very prolonged seizures, which is positive. Work actively to manage your epilepsy condition, which will minimize the risk of such episodes, which can contribute to impaired cognitive status.   I would like to reassure you that I do not think that you have dementia. Your test data is inconsistent with the most common form of dementia, Alzheimer's disease, and I do not see compelling indications of any other type of dementia. I would like you to know that the frequency of dementia in individuals your age is extremely low. A recent meta analysis found that the global age-standardized prevalence of dementia is approximately 1.19 per 1000 individuals from 35 to 51 years of age, the vast majority of which is in the 39 to 45 age range. To put that into perspective, your odds of dying in a motor vehicle collision are about 1 in 107, your odds of dying from falling are about 1 in 106, and your odds of dying from drowning the odds are about 1 in 1,128 [facility] [employer], 2019). Simply going off your age with no other information, it is not very likely that your issues are due to early onset  dementia.   You can return for repeat evaluation in 2 years or sooner as clinically indicated, which can allow [place] to monitor your progress over time.

## 2020-11-29 NOTE — Progress Notes (Signed)
NEUROPSYCHOLOGY FEEDBACK NOTE [facility]  Feedback Note: I met with Diana Quinn to review the findings resulting from her neuropsychological evaluation. Since the last appointment, she has been about the same. Time was spent reviewing the impressions and recommendations that are detailed in the evaluation report. 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 I counseled her about likely contributing factors including medications, poor sleep, and perhaps also seizure condition but I think that is less likely given lack of status epilepticus or significant prolonged seizures. I took time to explain the findings and answer all the patient's questions. I encouraged Diana Quinn to contact me should she have any further questions or if further follow up is desired.   Current Medications and Medical History   Current Outpatient Medications  Medication Sig Dispense Refill   acarbose (PRECOSE) 100 MG tablet Take 100 mg by mouth 3 (three) times daily. With meals     alendronate (FOSAMAX) 70 MG tablet Take 70 mg by mouth once a week.     butalbital-acetaminophen-caffeine (FIORICET) 50-325-40 MG tablet Take 1 tablet by mouth every 4 (four) hours as needed for pain.     Calcium Carb-Cholecalciferol (CALTRATE 600+D3) 600-800 MG-UNIT TABS Take 1 tablet by mouth 2 (two) times daily.     cetirizine (ZYRTEC) 10 MG tablet Take 10 mg by mouth daily.     cloBAZam (ONFI) 10 MG tablet Take 10 mg nightly for one week then increase to 10 mg two times a day     cyanocobalamin (,VITAMIN B-12,) 1000 MCG/ML injection Inject 1,000 mcg into the muscle every 30 (thirty) days.     ergocalciferol (VITAMIN D2) 1.25 MG (50000 UT) capsule Take 1.25 mcg by mouth once a week.      FLUoxetine (PROZAC) 20 MG capsule Take 20 mg by mouth every morning.     FYAVOLV 0.5-2.5 MG-MCG tablet Take 1 tablet by mouth daily.     gabapentin  (NEURONTIN) 400 MG capsule Take 1 capsule (400 mg total) by mouth 4 (four) times daily. 120 capsule 5   Galcanezumab-gnlm 120 MG/ML SOAJ Inject 120 mg into the skin every 30 (thirty) days.     HYDROcodone-acetaminophen (NORCO/VICODIN) 5-325 MG tablet Take 1 tablet by mouth every 8 (eight) hours as needed for severe pain. Must last 30 days 90 tablet 0   isosorbide dinitrate (ISORDIL) 30 MG tablet Take 30 mg by mouth daily.     isosorbide mononitrate (IMDUR) 30 MG 24 hr tablet Take 30 mg by mouth daily.     levothyroxine (LEVOXYL) 88 MCG tablet Take 88 mcg by mouth daily.     LINZESS 290 MCG CAPS capsule Take 290 mcg by mouth daily.     metoprolol succinate (TOPROL-XL) 50 MG 24 hr tablet 100 mg.      nitroGLYCERIN (NITROSTAT) 0.4 MG SL tablet Place 0.4 mg under the tongue as needed.     ondansetron (ZOFRAN) 8 MG tablet Take 8 mg by mouth as needed.     OXcarbazepine (TRILEPTAL) 300 MG/5ML suspension Take 450 mg by mouth 2 (two) times daily.     pantoprazole (PROTONIX) 40 MG tablet Take 40 mg by mouth daily.     polyethylene glycol (MIRALAX / GLYCOLAX) 17 g packet Take 17 g by mouth 2 (two) times daily. (Patient taking differently: Take 17 g by mouth daily as needed. ) 14 each 0   promethazine (PHENERGAN) 12.5 MG tablet Take by mouth.  QUEtiapine (SEROQUEL) 300 MG tablet Take 600 mg by mouth at bedtime.      ranolazine (RANEXA) 500 MG 12 hr tablet Take 1,000 mg by mouth 2 (two) times daily.      rosuvastatin (CRESTOR) 5 MG tablet Take 10 mg by mouth daily.      tiZANidine (ZANAFLEX) 4 MG tablet Take 1 tablet (4 mg total) by mouth 3 (three) times daily. 90 tablet 5   topiramate (TOPAMAX) 50 MG tablet Take 100 mg by mouth 2 (two) times daily.      torsemide (DEMADEX) 10 MG tablet TK 1 T PO PRF SWELLING     traZODone (DESYREL) 100 MG tablet Take 100 mg by mouth at bedtime.     zolpidem (AMBIEN) 5 MG tablet Take 5 mg by mouth at bedtime.     No current facility-administered medications for this  visit.    Patient Active Problem List   Diagnosis Date Noted   Breakthrough seizure ([place]) 08/26/2019   Left ankle sprain 04/14/2019   Closed torus fracture of upper end of right fibula 04/13/2019   History of angina 04/10/2019   Acute marginal ulcer 12/16/2018   SVT (supraventricular tachycardia) ([place]) 11/11/2018   Essential hypertension 11/04/2018   Seizures ([place]) 10/02/2018   H/O gastric bypass 07/27/2018   Iron deficiency anemia 07/27/2018   Chronic abdominal pain 07/12/2018   Intractable abdominal pain 07/12/2018   Hypoglycemia after GI (gastrointestinal) surgery 06/29/2018   Spondylosis without myelopathy or radiculopathy, lumbosacral region 04/27/2018   Chronic low back pain (Bilateral) (L>R) w/o sciatica 04/27/2018   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   Chronic hip pain (Bilateral) 04/14/2018   Chronic sacroiliac joint pain (Bilateral) 04/14/2018   Lumbar facet syndrome (Bilateral) (L>R) 04/14/2018   Neurogenic pain 04/14/2018   Chronic musculoskeletal pain 04/14/2018   Chronic lumbar radiculitis (S1) (Bilateral) 04/14/2018   Disorder of sacrum 04/14/2018   Neuralgia, neuritis or radiculitis 04/14/2018   Osteoarthritis of knees (Bilateral) 04/13/2018   Tricompartment osteoarthritis of knee (Right) 04/13/2018   Chronic low back pain (Primary Area of Pain) (Bilateral) (L>R) w/ sciatica 03/31/2018   Chronic lower extremity pain (Secondary Area of Pain) (Bilateral) (L>R) 03/31/2018   Chronic neck pain (Tertiary Area of Pain) (Bilateral) (L>R) 03/31/2018   Occipital headache (Fourth Area of Pain) (Bilateral) (L>R) 03/31/2018   Pharmacologic therapy 03/31/2018   Disorder of skeletal system 03/31/2018   Problems influencing health status 03/31/2018   Chronic knee pain (Bilateral) (L>R) 03/31/2018   Chronic pain syndrome 03/31/2018   DDD (degenerative disc disease), lumbar 02/08/2018   Other and unspecified hyperlipidemia 02/08/2018   Lumbar spondylosis 02/08/2018    Vitamin D deficiency 02/08/2018   Angina pectoris ([place]) 01/26/2018   Hypotension 01/26/2018   Impaired glucose tolerance 01/26/2018   Other B-complex deficiencies 01/26/2018   Schizophrenia ([place]) 01/26/2018   Epilepsy ([place]) 07/10/2011   Status post bariatric surgery 03/24/2011   Bipolar disorder ([place]) 03/24/2011   Generalized anxiety disorder 03/24/2011   Hypothyroidism 03/24/2011   Migraine headache 03/24/2011   Sleep disturbances 03/24/2011    Mental Status and Behavioral Observations  Diana Quinn presented on time to the present encounter and was alert and generally oriented. Speech was normal in rate, rhythm, volume, and prosody. Self-reported mood was "I'm having some issues today" and affect was mainly neutral to euthymic. Thought process was goal-oriented with some mild circumstantiality and thought content was appropriate. There were no safety concerns identified at today's encounter, such as thoughts of harming self or others.  Plan  Feedback provided regarding the patient's neuropsychological evaluation. 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 Reassured her that this does not look like AD and neurodegeneration is exceedingly unlikely in a person her age. We discussed importance of routine, sleep hygiene, and behavioral activation, as outlined in the patient instructions. Diana Quinn was encouraged to contact me if any questions arise or if further follow up is desired.   Diana Simas Suella Kindred, PsyD, ABN Clinical Neuropsychologist  Service(s) Provided at This Encounter: 30 minutes [phone]; Conjoint therapy with patient present)

## 2021-09-19 ENCOUNTER — Other Ambulatory Visit (HOSPITAL_COMMUNITY): Payer: Self-pay | Admitting: *Deleted

## 2021-09-19 DIAGNOSIS — I2510 Atherosclerotic heart disease of native coronary artery without angina pectoris: Secondary | ICD-10-CM

## 2021-10-03 ENCOUNTER — Telehealth (HOSPITAL_COMMUNITY): Payer: Self-pay | Admitting: *Deleted

## 2021-10-03 NOTE — Telephone Encounter (Signed)
Reaching out to patient to offer assistance regarding upcoming cardiac imaging study; pt verbalizes understanding of appt date/time, parking situation and where to check in, pre-test NPO status and verified current allergies; name and call back number provided for further questions should they arise  Ashlynd Michna RN Navigator Cardiac Imaging [facility] [phone] office [phone] cell  Patient to take her daily medications. She is aware to arrive at 1:30pm. 

## 2021-10-03 NOTE — Telephone Encounter (Signed)
Attempted to call patient regarding upcoming cardiac CT appointment. °Left message on voicemail with name and callback number ° °Kanesha Cadle RN Navigator Cardiac Imaging ° [facility] Services °[phone] Office °[phone] Cell ° °

## 2021-10-07 ENCOUNTER — Telehealth (HOSPITAL_COMMUNITY): Payer: Self-pay | Admitting: *Deleted

## 2021-10-07 ENCOUNTER — Ambulatory Visit (HOSPITAL_COMMUNITY): Admission: RE | Admit: 2021-10-07 | Payer: Medicaid Other | Source: Ambulatory Visit

## 2021-10-07 NOTE — Telephone Encounter (Signed)
Patient calling to r/s her CCTA appointment due to a recent fall. New appointment made for Aug 30 at 2pm. Patient states she uses [employer] transportation will arrange for a 1:30pm arrival.  Larey Brick RN Navigator Cardiac Imaging [facility] [phone] Office [phone] Cell

## 2021-10-10 ENCOUNTER — Encounter (HOSPITAL_BASED_OUTPATIENT_CLINIC_OR_DEPARTMENT_OTHER): Payer: Self-pay

## 2021-10-10 ENCOUNTER — Emergency Department (HOSPITAL_BASED_OUTPATIENT_CLINIC_OR_DEPARTMENT_OTHER)
Admission: EM | Admit: 2021-10-10 | Discharge: 2021-10-10 | Disposition: A | Discharge status: Home / Self Care | Payer: Medicaid Other | Attending: Emergency Medicine | Admitting: Emergency Medicine

## 2021-10-10 ENCOUNTER — Emergency Department (HOSPITAL_BASED_OUTPATIENT_CLINIC_OR_DEPARTMENT_OTHER): Payer: Medicaid Other

## 2021-10-10 ENCOUNTER — Other Ambulatory Visit: Payer: Self-pay

## 2021-10-10 DIAGNOSIS — M545 Low back pain, unspecified: Secondary | ICD-10-CM | POA: Insufficient documentation

## 2021-10-10 DIAGNOSIS — Z79899 Other long term (current) drug therapy: Secondary | ICD-10-CM | POA: Diagnosis not present

## 2021-10-10 DIAGNOSIS — R42 Dizziness and giddiness: Secondary | ICD-10-CM | POA: Insufficient documentation

## 2021-10-10 DIAGNOSIS — M25551 Pain in right hip: Secondary | ICD-10-CM | POA: Diagnosis not present

## 2021-10-10 DIAGNOSIS — R7309 Other abnormal glucose: Secondary | ICD-10-CM | POA: Insufficient documentation

## 2021-10-10 DIAGNOSIS — W19XXXA Unspecified fall, initial encounter: Secondary | ICD-10-CM | POA: Insufficient documentation

## 2021-10-10 DIAGNOSIS — E039 Hypothyroidism, unspecified: Secondary | ICD-10-CM | POA: Insufficient documentation

## 2021-10-10 LAB — CBC WITH DIFFERENTIAL/PLATELET
Abs Immature Granulocytes: 0.03 10*3/uL (ref 0.00–0.07)
Basophils Absolute: 0.1 10*3/uL (ref 0.0–0.1)
Basophils Relative: 1 %
Eosinophils Absolute: 0.2 10*3/uL (ref 0.0–0.5)
Eosinophils Relative: 3 %
HCT: 38.4 % (ref 36.0–46.0)
Hemoglobin: 12.1 g/dL (ref 12.0–15.0)
Immature Granulocytes: 1 %
Lymphocytes Relative: 37 %
Lymphs Abs: 2.4 10*3/uL (ref 0.7–4.0)
MCH: 32.9 pg (ref 26.0–34.0)
MCHC: 31.5 g/dL (ref 30.0–36.0)
MCV: 104.3 fL — ABNORMAL HIGH (ref 80.0–100.0)
Monocytes Absolute: 0.6 10*3/uL (ref 0.1–1.0)
Monocytes Relative: 9 %
Neutro Abs: 3.1 10*3/uL (ref 1.7–7.7)
Neutrophils Relative %: 49 %
Platelets: 314 10*3/uL (ref 150–400)
RBC: 3.68 MIL/uL — ABNORMAL LOW (ref 3.87–5.11)
RDW: 13.4 % (ref 11.5–15.5)
WBC: 6.3 10*3/uL (ref 4.0–10.5)
nRBC: 0 % (ref 0.0–0.2)

## 2021-10-10 LAB — BASIC METABOLIC PANEL
Anion gap: 4 — ABNORMAL LOW (ref 5–15)
BUN: 17 mg/dL (ref 6–20)
CO2: 24 mmol/L (ref 22–32)
Calcium: 8.4 mg/dL — ABNORMAL LOW (ref 8.9–10.3)
Chloride: 113 mmol/L — ABNORMAL HIGH (ref 98–111)
Creatinine, Ser: 1 mg/dL (ref 0.44–1.00)
GFR, Estimated: >60 mL/min (ref >60)
Glucose, Bld: 114 mg/dL — ABNORMAL HIGH (ref 70–99)
Potassium: 4.3 mmol/L (ref 3.5–5.1)
Sodium: 141 mmol/L (ref 135–145)

## 2021-10-10 LAB — CBG MONITORING, ED: Glucose-Capillary: 104 mg/dL — ABNORMAL HIGH (ref 70–99)

## 2021-10-10 NOTE — ED Provider Notes (Signed)
[facility] EMERGENCY DEPARTMENT Provider Note   CSN: [id number] Arrival date & time: 10/10/21  1421     History  Chief Complaint  Patient presents with   Back Pain    Diana Quinn is a 56 y.o. female history includes anemia, orthostatic hypotension, MI, seizures, gastric bypass, chronic pain, hypothyroidism, schizophrenia.   Patient presents to the ER today for evaluation of low back pain.  Patient reports that several times a week she will have an episode of hypoglycemia which will cause her to get lightheaded and fall.  Patient reports this is been ongoing problem since 2006. 3 days ago she reports that she had episode of low blood sugar she fell directly onto her lower back she reports that she has had a severe constant pain since that time worsened with movement palpation improves with rest.  Pain does not radiate.  Patient reports that while she was on the ground her partner was able to rub some Kool-Aid into her mouth she gradually regained consciousness and was able to eat food and her lightheadedness resolved.  Patient reports has not recurred in the past 3 days.  Patient reports that her endocrinologist has prescribed her glucagon shots to help with this.  Patient reports that when she landed on her back she developed some aching constant lower back pain additionally she is developing some aching right hip pain this is worsened with movement and has improved somewhat with rest.  She denies any abnormal features to her lightheadedness and fall the other day she reports this is very routine for her.  She denies any associated chest pain/shortness of breath, numbness, tingling, saddle paresthesias, bowel/bladder incontinence, abdominal pain, hematuria, neck pain or any additional concerns. HPI     Home Medications Prior to Admission medications   Medication Sig Start Date End Date Taking? Authorizing Provider  acarbose (PRECOSE) 100 MG tablet Take 100 mg by mouth 3 (three)  times daily. With meals 06/29/18   [provider]  alendronate (FOSAMAX) 70 MG tablet Take 70 mg by mouth once a week. 02/19/18   [provider]  butalbital-acetaminophen-caffeine (FIORICET) 50-325-40 MG tablet Take 1 tablet by mouth every 4 (four) hours as needed for pain.    [provider]  Calcium Carb-Cholecalciferol (CALTRATE 600+D3) 600-800 MG-UNIT TABS Take 1 tablet by mouth 2 (two) times daily. 01/26/18   [provider]  cetirizine (ZYRTEC) 10 MG tablet Take 10 mg by mouth daily. 04/29/18   [provider]  cloBAZam (ONFI) 10 MG tablet Take 10 mg nightly for one week then increase to 10 mg two times a day 06/30/19   [provider]  cyanocobalamin (,VITAMIN B-12,) 1000 MCG/ML injection Inject 1,000 mcg into the muscle every 30 (thirty) days. 02/25/18   [provider]  ergocalciferol (VITAMIN D2) 1.25 MG (50000 UT) capsule Take 1.25 mcg by mouth once a week.  02/08/18   [provider]  FLUoxetine (PROZAC) 20 MG capsule Take 20 mg by mouth every morning. 04/06/19   [provider]  FYAVOLV 0.5-2.5 MG-MCG tablet Take 1 tablet by mouth daily. 02/19/18   [provider]  gabapentin (NEURONTIN) 400 MG capsule Take 1 capsule (400 mg total) by mouth 4 (four) times daily. 07/08/19 01/04/20  Delano Metz, MD  Galcanezumab-gnlm 120 MG/ML SOAJ Inject 120 mg into the skin every 30 (thirty) days. 06/28/18   [provider]  HYDROcodone-acetaminophen (NORCO/VICODIN) 5-325 MG tablet Take 1 tablet by mouth every 8 (eight) hours as needed  for severe pain. Must last 30 days 09/06/19 10/06/19  Delano MetzNaveira, Francisco, MD  isosorbide dinitrate (ISORDIL) 30 MG tablet Take 30 mg by mouth daily.    [provider]  isosorbide mononitrate (IMDUR) 30 MG 24 hr tablet Take 30 mg by mouth daily. 02/08/19   [provider]  levothyroxine (LEVOXYL) 88 MCG tablet Take 88 mcg by mouth daily. 01/26/18   [provider]  LINZESS 290 MCG CAPS capsule Take 290 mcg by mouth daily. 04/05/19   [provider]  metoprolol succinate (TOPROL-XL) 50 MG 24 hr tablet 100 mg.  11/22/18   [provider]  nitroGLYCERIN (NITROSTAT) 0.4 MG SL tablet Place 0.4 mg under the tongue as needed. 01/26/18   [provider]  ondansetron (ZOFRAN) 8 MG tablet Take 8 mg by mouth as needed. 01/26/18   [provider]  OXcarbazepine (TRILEPTAL) 300 MG/5ML suspension Take 450 mg by mouth 2 (two) times daily.    [provider]  pantoprazole (PROTONIX) 40 MG tablet Take 40 mg by mouth daily.    [provider]  polyethylene glycol (MIRALAX / GLYCOLAX) 17 g packet Take 17 g by mouth 2 (two) times daily. Patient taking differently: Take 17 g by mouth daily as needed.  07/16/18   Altamese DillingVachhani, Vaibhavkumar, MD  promethazine (PHENERGAN) 12.5 MG tablet Take by mouth.    [provider]  QUEtiapine (SEROQUEL) 300 MG tablet Take 600 mg by mouth at bedtime.     [provider]  ranolazine (RANEXA) 500 MG 12 hr tablet Take 1,000 mg by mouth 2 (two) times daily.     [provider]  rosuvastatin (CRESTOR) 5 MG tablet Take 10 mg by mouth daily.  02/08/18   [provider]  tiZANidine (ZANAFLEX) 4 MG tablet Take 1 tablet (4 mg total) by mouth 3 (three) times daily. 07/08/19 01/04/20  Delano MetzNaveira, Francisco, MD  topiramate (TOPAMAX) 50 MG tablet Take 100 mg by mouth 2 (two) times daily.     [provider]  torsemide (DEMADEX) 10 MG tablet TK 1 T PO PRF SWELLING 11/11/18   [provider]  traZODone (DESYREL) 100 MG tablet Take 100 mg by mouth at bedtime.    [provider]  zolpidem (AMBIEN) 5 MG tablet Take 5 mg by mouth at bedtime. 07/09/18 07/05/19  [provider]      Allergies    Depakote [divalproex sodium], Pregabalin, and Levetiracetam    Review of Systems   Review of SystemsTen systems are reviewed and are negative for  acute change except as noted in the HPI  Physical Exam Updated Vital Signs BP 137/75   Pulse (!) 56   Temp 97.6 F (36.4 C) (Oral)   Resp 15   Ht 5\' 3"  (1.6 m)   Wt 90.3 kg   SpO2 97%   BMI 35.25 kg/m  Physical Exam Constitutional:      General: She is not in acute distress.    Appearance: Normal appearance. She is well-developed. She is not ill-appearing or diaphoretic.  HENT:     Head: Normocephalic and atraumatic.  Eyes:     General: Vision grossly intact. Gaze aligned appropriately.     Pupils: Pupils are equal, round, and reactive to light.  Neck:     Trachea: Trachea and phonation normal.  Cardiovascular:     Rate and Rhythm: Normal rate and regular rhythm.     Pulses:          Dorsalis pedis pulses  are 1+ on the right side and 1+ on the left side.     Heart sounds: Normal heart sounds.  Pulmonary:     Effort: Pulmonary effort is normal. No respiratory distress.     Breath sounds: Normal breath sounds.  Chest:     Chest wall: No deformity, tenderness or crepitus.  Abdominal:     General: There is no distension.     Palpations: Abdomen is soft.     Tenderness: There is no abdominal tenderness. There is no guarding or rebound.  Musculoskeletal:        General: Normal range of motion.     Cervical back: Normal range of motion.     Comments: Bilateral lumbar muscular tenderness palpation right worse than left.  No midline spinal tenderness palpation no crepitus step-off or deformity the spine.  Patient has full range of motion for age and condition of the lumbar spine with increased pain with all movement.  Pelvis is stable to compression without pain.  No tenderness of the right buttock or hip.  No overlying skin changes of the right buttock or hip.  No leg length shortening or rotation.  Patient retains full range of motion for age and body habitus of the right hip without increased pain including internal/external rotation. --- No tenderness palpation of the cervical,  thoracic spine.  No cervical or thoracic paraspinal muscular tense palpation.  No tenderness palpation of the bilateral shoulders, elbows, wrists or hands.  No tenderness to the left hip, bilateral knees, bilateral ankles or feet.  Patient is ambulatory without assistance or difficulty at bedside.  Feet:     Right foot:     Protective Sensation: 3 sites tested.  3 sites sensed.     Left foot:     Protective Sensation: 3 sites tested.  3 sites sensed.  Skin:    General: Skin is warm and dry.  Neurological:     Mental Status: She is alert.     GCS: GCS eye subscore is 4. GCS verbal subscore is 5. GCS motor subscore is 6.     Comments: Speech is clear and goal oriented, follows commands Major Cranial nerves without deficit, no facial droop Moves extremities without ataxia, coordination intact  Psychiatric:        Behavior: Behavior normal.     ED Results / Procedures / Treatments   Labs (all labs ordered are listed, but only abnormal results are displayed) Labs Reviewed  BASIC METABOLIC PANEL - Abnormal; Notable for the following components:      Result Value   Chloride 113 (*)    Glucose, Bld 114 (*)    Calcium 8.4 (*)    Anion gap 4 (*)    All other components within normal limits  CBC WITH DIFFERENTIAL/PLATELET - Abnormal; Notable for the following components:   RBC 3.68 (*)    MCV 104.3 (*)    All other components within normal limits  CBG MONITORING, ED - Abnormal; Notable for the following components:   Glucose-Capillary 104 (*)    All other components within normal limits  CBC WITH DIFFERENTIAL/PLATELET    EKG EKG Interpretation  Date/Time:  Thursday October 10 2021 18:00:14 EDT Ventricular Rate:  56 PR Interval:  170 QRS Duration: 104 QT Interval:  467 QTC Calculation: 451 R Axis:   41 Text Interpretation: Sinus rhythm Left atrial enlargement Nonspecific T abnrm, anterolateral leads No significant change since last tracing Confirmed by Gwyneth Sprout ([id number])  on 10/10/2021 6:30:54 PM  Radiology DG Chest 2 View  Result Date: 10/10/2021 CLINICAL DATA:  Back pain after fall. EXAM: CHEST - 2 VIEW COMPARISON:  September 01, 2019. FINDINGS: Stable cardiomediastinal silhouette. Both lungs are clear. The visualized skeletal structures are unremarkable. IMPRESSION: No active cardiopulmonary disease. Electronically Signed   By: Lupita Raider M.D.   On: 10/10/2021 15:43   DG Hip Unilat W or Wo Pelvis 2-3 Views Right  Result Date: 10/10/2021 CLINICAL DATA:  Right hip pain after fall. EXAM: DG HIP (WITH OR WITHOUT PELVIS) 2-3V RIGHT COMPARISON:  None Available. FINDINGS: There is no evidence of hip fracture or dislocation. Mild osteophyte formation is noted involving both hips. 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 No acute abnormality seen. Electronically Signed   By: Lupita Raider M.D.   On: 10/10/2021 15:42   DG Lumbar Spine Complete  Result Date: 10/10/2021 CLINICAL DATA:  Lower back pain after fall. EXAM: LUMBAR SPINE - COMPLETE 4+ VIEW COMPARISON:  January 15, 2018. FINDINGS: There is no evidence of lumbar spine fracture. Alignment is normal. Intervertebral disc spaces are maintained. IMPRESSION: Negative. Electronically Signed   By: Lupita Raider M.D.   On: 10/10/2021 15:40   CT Head Wo Contrast  Result Date: 10/10/2021 CLINICAL DATA:  Head trauma, abnormal mental status (Age 29-64y); Neck trauma, uncomplicated (NEXUS/CCR neg) (Age 65-64y) EXAM: CT HEAD WITHOUT CONTRAST CT CERVICAL SPINE WITHOUT CONTRAST TECHNIQUE: Multidetector CT imaging of the head and cervical spine was performed following the standard protocol without intravenous contrast. Multiplanar CT image reconstructions of the cervical spine were also generated. RADIATION DOSE REDUCTION: This exam was performed according to the departmental dose-optimization program which includes automated exposure control, adjustment of the mA and/or kV according to patient size and/or use of  iterative reconstruction technique. COMPARISON:  MRI head 06/07/2019, head CT 05/19/2019 FINDINGS: CT HEAD FINDINGS Brain: No evidence of acute intracranial hemorrhage or extra-axial collection.Unchanged mild prominence of the left frontal extra-axial space.The ventricles are normal in size. Vascular: No hyperdense vessel or unexpected calcification. Skull: Negative for skull fracture. Sinuses/Orbits: Minimal mucosal thickening in the right maxillary sinus. Mastoid air cells are clear. Orbits are unremarkable. Other: None. CT CERVICAL SPINE FINDINGS Alignment: Normal. Skull base and vertebrae: No acute cervical spine fracture. No aggressive osseous lesion. Soft tissues and spinal canal: No prevertebral fluid or swelling. No visible canal hematoma. Disc levels: Mild multilevel degenerative disc disease worst at C5-C6. mild multilevel facet arthropathy. Upper chest: Negative. Other: None. IMPRESSION: No acute intracranial abnormality. No acute cervical spine fracture. Electronically Signed   By: Caprice Renshaw M.D.   On: 10/10/2021 15:27   CT Cervical Spine Wo Contrast  Result Date: 10/10/2021 CLINICAL DATA:  Head trauma, abnormal mental status (Age 53-64y); Neck trauma, uncomplicated (NEXUS/CCR neg) (Age 4-64y) EXAM: CT HEAD WITHOUT CONTRAST CT CERVICAL SPINE WITHOUT CONTRAST TECHNIQUE: Multidetector CT imaging of the head and cervical spine was performed following the standard protocol without intravenous contrast. Multiplanar CT image reconstructions of the cervical spine were also generated. RADIATION DOSE REDUCTION: This exam was performed according to the departmental dose-optimization program which includes automated exposure control, adjustment of the mA and/or kV according to patient size and/or use of iterative reconstruction technique. COMPARISON:  MRI head 06/07/2019, head CT 05/19/2019 FINDINGS: CT HEAD FINDINGS Brain: No evidence of acute intracranial hemorrhage or extra-axial collection.Unchanged mild  prominence of the left frontal extra-axial space.The ventricles are normal in size. Vascular: No hyperdense vessel or unexpected calcification. Skull: Negative for skull  fracture. Sinuses/Orbits: Minimal mucosal thickening in the right maxillary sinus. Mastoid air cells are clear. Orbits are unremarkable. Other: None. CT CERVICAL SPINE FINDINGS Alignment: Normal. Skull base and vertebrae: No acute cervical spine fracture. No aggressive osseous lesion. Soft tissues and spinal canal: No prevertebral fluid or swelling. No visible canal hematoma. Disc levels: Mild multilevel degenerative disc disease worst at C5-C6. mild multilevel facet arthropathy. Upper chest: Negative. Other: None. IMPRESSION: No acute intracranial abnormality. No acute cervical spine fracture. Electronically Signed   By: Caprice Renshaw M.D.   On: 10/10/2021 15:27    Procedures Procedures    Medications Ordered in ED Medications - No data to display  ED Course/ Medical Decision Making/ A&P Clinical Course as of 10/10/21 1950  Thu Oct 10, 2021  1658 DG Chest 2 View I have personally reviewed and interpreted patient's two-view chest x-ray, I do not appreciate any obvious PTX, PNA or acute cardiopulmonary pathologies. [BM]  1659 DG Hip Unilat W or Wo Pelvis 2-3 Views Right I have personally reviewed and interpreted patient's two-view right hip x-ray with pelvis.  I do not appreciate any obvious fracture or dislocation.  Patient has osteoarthritic changes. [BM]  1700 DG Lumbar Spine Complete I have personally reviewed and interpreted patient's 5 view x-ray of lumbar spine.  I do not appreciate any obvious fracture or spondylolisthesis.  Osteoarthritic facet disease and degenerative disc disease present. [BM]  1701 CT Head Wo Contrast I have personally reviewed and interpreted patient's CT head.  On appreciate any obvious intracranial hemorrhage or shift. [BM]  1702 CT Cervical Spine Wo Contrast I have personally reviewed and  interpreted patient's CT cervical spine.  I not appreciate any obvious acute fracture or spondylolisthesis. [BM]  1928 CBC with Differential(!) CBC without anemia.  No thrombocytopenia or leukocytosis. [BM]  1928 Basic metabolic panel(!) BMP without emergent electrolyte derangement, AKI or gap. [BM]  1928 POC CBG, ED(!) CBG 104, no evidence for hypoglycemia. [BM]  1929 EKG 12-Lead I have personally reviewed and interpreted patient's twelve-lead EKG.  I do not appreciate any obvious acute ischemic changes. [BM]    Clinical Course User Index [BM] Bill Salinas, PA-C                           Medical Decision Making Problems Addressed: Acute bilateral low back pain without sciatica:    Details: Suspect muscular pain after fall. Patient is aware that occult fractures may be present and follow-up is recommended.  Patient without sciatic symptoms.  Reassuring neurologic examination.  Low suspicion for myelopathy, cauda equina or other acute causes of low back pain.  Additionally patient without hematuria, no abdominal pain no suspicion for kidney/retroperitoneal injury at this time.  Patient will be referred to on-call orthopedic specialist Dr. Blanchie Dessert for further treatment.  Discussed rice therapy/heat. Right hip pain:    Details: Suspect muscular pain after fall.  Low suspicion for radicular pain at this time.  No clear evidence for fracture or dislocation on x-ray today however patient is aware that occult fractures may be present.  Patient is ambulatory at recheck, low suspicion for fracture this point.  Patient encouraged to follow-up with on-call orthopedic specialist Dr. Blanchie Dessert for further evaluation and treatment.  Amount and/or Complexity of Data Reviewed Labs: ordered. Radiology: ordered. Decision-making details documented in ED Course.  Risk Risk Details: Patient reassessed she is well-appearing no acute distress vital signs are stable on room air.  Patient is low back  pain  and hip pain addressed as above.  I had a long discussion regarding patient's near syncopal episode causing her fall she reports this is due to her recurrent episodes of hypoglycemia that have been ongoing since 2006 she is followed by endocrinology for this and has glucagon pens she denies any abnormal features to her episode 3 days ago.  She denies any associated chest pain or shortness of breath.  EKG reassuring.  No pleurisy, low suspicion for arrhythmia, PE or other emergent causes of near syncopal episode.  Additionally patient is not hypoglycemic at this visit, CBC also does not show anemia.  I encourage patient to follow-up closely with her PCP and endocrinologist for recheck.  I discussed strict ER precautions with the patient and she stated understanding. Discussed case with Dr. Anitra Lauth who agrees with workup and discharge at this time.     At this time there does not appear to be any evidence of an acute emergency medical condition and the patient appears stable for discharge with appropriate outpatient follow up. Diagnosis was discussed with patient who verbalizes understanding of care plan and is agreeable to discharge. I have discussed return precautions with patient  who verbalizes understanding. Patient encouraged to follow-up with their PCP. All questions answered.  Note: Portions of this report may have been transcribed using voice recognition software. Every effort was made to ensure accuracy; however, inadvertent computerized transcription errors may still be present.         Final Clinical Impression(s) / ED Diagnoses Final diagnoses:  Acute bilateral low back pain without sciatica  Right hip pain  Fall, initial encounter    Rx / DC Orders ED Discharge Orders     None         Elizabeth Palau 10/10/21 1950    Gwyneth Sprout, MD 10/10/21 2046

## 2021-10-10 NOTE — Discharge Instructions (Signed)
At this time there does not appear to be the presence of an emergent medical condition, however there is always the potential for conditions to change. Please read and follow the below instructions.  Please return to the Emergency Department immediately for any new or worsening symptoms. Please be sure to follow up with your Primary Care Provider within one week regarding your visit today; please call their office to schedule an appointment even if you are feeling better for a follow-up visit. You may follow-up with the on-call orthopedic specialist Dr. Blanchie Dessert for further evaluation of your low back and right hip pain.  Please use rest ice and elevation help with your symptoms.  You may also try heat to help with your symptoms.   Please read the additional information packets attached to your discharge summary.  Go to the nearest Emergency Department immediately if: You have fever or chills  Do not take your medicine if  develop an itchy rash, swelling in your mouth or lips, or difficulty breathing; call 911 and seek immediate emergency medical attention if this occurs.  You may review your lab tests and imaging results in their entirety on your MyChart account.  Please discuss all results of fully with your primary care provider and other specialist at your follow-up visit.  Note: Portions of this text may have been transcribed using voice recognition software. Every effort was made to ensure accuracy; however, inadvertent computerized transcription errors may still be present.

## 2021-10-10 NOTE — ED Notes (Signed)
Pt in bed resting, respirations even and unlabored. 

## 2021-10-10 NOTE — ED Triage Notes (Signed)
Pt reports she passed out due to blood sugar dropping causing her fall landing on back. 3 days ago. Reports pain in lower back. Reports pain worse right side than left

## 2021-10-10 NOTE — ED Notes (Signed)
Patient transported to X-ray 

## 2021-11-06 ENCOUNTER — Ambulatory Visit (HOSPITAL_COMMUNITY): Admission: RE | Admit: 2021-11-06 | Payer: Medicaid Other | Source: Ambulatory Visit

## 2021-11-12 ENCOUNTER — Encounter (HOSPITAL_COMMUNITY): Payer: Self-pay

## 2021-12-16 ENCOUNTER — Telehealth (HOSPITAL_COMMUNITY): Payer: Self-pay | Admitting: *Deleted

## 2021-12-16 NOTE — Telephone Encounter (Signed)
Reaching out to patient to offer assistance regarding upcoming cardiac imaging study; pt verbalizes understanding of appt date/time, parking situation and where to check in,  and verified current allergies; name and call back number provided for further questions should they arise  Gordy Clement RN Navigator Cardiac Imaging [facility] [phone] office [phone] cell  Patient aware to arrive at 2pm. She states she takes medicaid transport.

## 2021-12-17 ENCOUNTER — Ambulatory Visit (HOSPITAL_COMMUNITY): Admission: RE | Admit: 2021-12-17 | Payer: Medicaid Other | Source: Ambulatory Visit

## 2021-12-20 ENCOUNTER — Other Ambulatory Visit: Payer: Self-pay | Admitting: Orthopedic Surgery

## 2021-12-20 ENCOUNTER — Other Ambulatory Visit (HOSPITAL_COMMUNITY): Payer: Self-pay | Admitting: Orthopedic Surgery

## 2021-12-20 DIAGNOSIS — S32010A Wedge compression fracture of first lumbar vertebra, initial encounter for closed fracture: Secondary | ICD-10-CM

## 2021-12-25 ENCOUNTER — Emergency Department (HOSPITAL_BASED_OUTPATIENT_CLINIC_OR_DEPARTMENT_OTHER)
Admission: EM | Admit: 2021-12-25 | Discharge: 2021-12-25 | Disposition: A | Discharge status: Home / Self Care | Payer: Medicaid Other | Attending: Emergency Medicine | Admitting: Emergency Medicine

## 2021-12-25 ENCOUNTER — Encounter (HOSPITAL_BASED_OUTPATIENT_CLINIC_OR_DEPARTMENT_OTHER): Payer: Self-pay | Admitting: Pediatrics

## 2021-12-25 ENCOUNTER — Other Ambulatory Visit: Payer: Self-pay

## 2021-12-25 ENCOUNTER — Emergency Department (HOSPITAL_BASED_OUTPATIENT_CLINIC_OR_DEPARTMENT_OTHER): Payer: Medicaid Other

## 2021-12-25 DIAGNOSIS — I2510 Atherosclerotic heart disease of native coronary artery without angina pectoris: Secondary | ICD-10-CM | POA: Insufficient documentation

## 2021-12-25 DIAGNOSIS — S2222XA Fracture of body of sternum, initial encounter for closed fracture: Secondary | ICD-10-CM | POA: Diagnosis not present

## 2021-12-25 DIAGNOSIS — X58XXXA Exposure to other specified factors, initial encounter: Secondary | ICD-10-CM | POA: Insufficient documentation

## 2021-12-25 DIAGNOSIS — N3000 Acute cystitis without hematuria: Secondary | ICD-10-CM | POA: Diagnosis not present

## 2021-12-25 DIAGNOSIS — R55 Syncope and collapse: Secondary | ICD-10-CM | POA: Diagnosis not present

## 2021-12-25 DIAGNOSIS — S299XXA Unspecified injury of thorax, initial encounter: Secondary | ICD-10-CM | POA: Diagnosis present

## 2021-12-25 LAB — BASIC METABOLIC PANEL
Anion gap: 5 (ref 5–15)
BUN: 12 mg/dL (ref 6–20)
CO2: 22 mmol/L (ref 22–32)
Calcium: 8.4 mg/dL — ABNORMAL LOW (ref 8.9–10.3)
Chloride: 106 mmol/L (ref 98–111)
Creatinine, Ser: 0.86 mg/dL (ref 0.44–1.00)
GFR, Estimated: >60 mL/min (ref >60)
Glucose, Bld: 125 mg/dL — ABNORMAL HIGH (ref 70–99)
Potassium: 4.5 mmol/L (ref 3.5–5.1)
Sodium: 133 mmol/L — ABNORMAL LOW (ref 135–145)

## 2021-12-25 LAB — URINALYSIS, ROUTINE W REFLEX MICROSCOPIC
Glucose, UA: NEGATIVE mg/dL
Hgb urine dipstick: NEGATIVE
Ketones, ur: NEGATIVE mg/dL
Nitrite: POSITIVE — AB
Protein, ur: NEGATIVE mg/dL
Specific Gravity, Urine: >=1.03 (ref 1.005–1.030)
pH: 6 (ref 5.0–8.0)

## 2021-12-25 LAB — URINALYSIS, MICROSCOPIC (REFLEX)

## 2021-12-25 LAB — CBC
HCT: 40.8 % (ref 36.0–46.0)
Hemoglobin: 13.2 g/dL (ref 12.0–15.0)
MCH: 32.9 pg (ref 26.0–34.0)
MCHC: 32.4 g/dL (ref 30.0–36.0)
MCV: 101.7 fL — ABNORMAL HIGH (ref 80.0–100.0)
Platelets: 404 10*3/uL — ABNORMAL HIGH (ref 150–400)
RBC: 4.01 MIL/uL (ref 3.87–5.11)
RDW: 12.6 % (ref 11.5–15.5)
WBC: 8 10*3/uL (ref 4.0–10.5)
nRBC: 0 % (ref 0.0–0.2)

## 2021-12-25 LAB — TROPONIN I (HIGH SENSITIVITY): Troponin I (High Sensitivity): 10 ng/L (ref <18)

## 2021-12-25 LAB — CBG MONITORING, ED: Glucose-Capillary: 118 mg/dL — ABNORMAL HIGH (ref 70–99)

## 2021-12-25 MED ORDER — DICLOFENAC SODIUM 1 % EX GEL: 2.0000 g | Freq: Four times a day (QID) | CUTANEOUS | 0 refills | Status: DC | PRN

## 2021-12-25 MED ORDER — SODIUM CHLORIDE 0.9 % IV SOLN
1.0000 g | Freq: Once | INTRAVENOUS | Status: DC
  Filled 2021-12-25: qty 10

## 2021-12-25 MED ORDER — CEPHALEXIN 500 MG PO CAPS: 500.0000 mg | ORAL_CAPSULE | Freq: Two times a day (BID) | ORAL | 0 refills | Status: AC

## 2021-12-25 NOTE — Discharge Instructions (Signed)
You were seen in the emergency room today with passing out and fall.  I am treating you for urine infection and have sent this medicine to the pharmacy.  We discussed using topical pain medicines as well as over-the-counter medicines for your sternum fracture.  I am also sending you home with an incentive spirometer to use hourly while awake for the next couple of weeks.  Please continue to follow closely with your cardiology team as well as your primary care physician.  Return with any new or suddenly worsening symptoms.

## 2021-12-25 NOTE — ED Provider Notes (Signed)
Emergency Department Provider Note   I have reviewed the triage vital signs and the nursing notes.   HISTORY  Chief Complaint Loss of Consciousness   HPI Diana Quinn is a 56 y.o. female with PMH of CAD and prior syncope presents emergency department for evaluation of multiple syncope episodes in the past couple of days.  No prodrome other than fatigue.  She feels like one of the episodes was due to hypoglycemia, for which she is following with endocrinology.  She had syncope yesterday with some lightheadedness but no chest pain or heart palpitations.  Currently she is feeling fairly well.  She does have some urine frequency.  Baseline bradycardia noted.   Past Medical History:  Diagnosis Date   Anemia    Gastric bypass status for obesity    H/O unstable angina    Hypoglycemia    MI (myocardial infarction) (HCC)    Orthostatic hypotension    Osteoporosis    Sciatic nerve disease    Seizures (HCC)    Thyroid disease     Review of Systems  Constitutional: No fever/chills Eyes: No visual changes. ENT: No sore throat. Cardiovascular: Denies chest pain. Positive syncope.  Respiratory: Denies shortness of breath. Gastrointestinal: No abdominal pain.  No nausea, no vomiting.  No diarrhea.  No constipation. Genitourinary: Negative for dysuria. Positive urine frequency.  Musculoskeletal: Negative for back pain. Skin: Negative for rash. Neurological: Negative for headaches, focal weakness or numbness.  ____________________________________________   PHYSICAL EXAM:  VITAL SIGNS: ED Triage Vitals  Enc Vitals Group     BP 12/25/21 1213 (!) 141/89     Pulse Rate 12/25/21 1213 (!) 50     Resp 12/25/21 1213 18     Temp 12/25/21 1213 98.3 F (36.8 C)     Temp Source 12/25/21 1213 Oral     SpO2 12/25/21 1213 100 %     Weight 12/25/21 1214 197 lb (89.4 kg)     Height 12/25/21 1214 5\' 3"  (1.6 m)   Constitutional: Alert and oriented. Well appearing and in no acute  distress. Eyes: Conjunctivae are normal.  Head: Atraumatic. Nose: No congestion/rhinnorhea. Mouth/Throat: Mucous membranes are moist. Neck: No stridor.  Cardiovascular: Bradycardia. Good peripheral circulation. Grossly normal heart sounds.   Respiratory: Normal respiratory effort.  No retractions. Lungs CTAB. Gastrointestinal: Soft and nontender. No distention.  Musculoskeletal: No lower extremity tenderness nor edema. No gross deformities of extremities. Neurologic:  Normal speech and language. No gross focal neurologic deficits are appreciated.  Skin:  Skin is warm, dry and intact. No rash noted.  ____________________________________________   LABS (all labs ordered are listed, but only abnormal results are displayed)  Labs Reviewed  BASIC METABOLIC PANEL - Abnormal; Notable for the following components:      Result Value   Sodium 133 (*)    Glucose, Bld 125 (*)    Calcium 8.4 (*)    All other components within normal limits  CBC - Abnormal; Notable for the following components:   MCV 101.7 (*)    Platelets 404 (*)    All other components within normal limits  URINALYSIS, ROUTINE W REFLEX MICROSCOPIC - Abnormal; Notable for the following components:   Color, Urine AMBER (*)    APPearance HAZY (*)    Bilirubin Urine SMALL (*)    Nitrite POSITIVE (*)    Leukocytes,Ua TRACE (*)    All other components within normal limits  URINALYSIS, MICROSCOPIC (REFLEX) - Abnormal; Notable for the following components:   Bacteria,  UA MANY (*)    All other components within normal limits  CBG MONITORING, ED - Abnormal; Notable for the following components:   Glucose-Capillary 118 (*)    All other components within normal limits  URINE CULTURE  TROPONIN I (HIGH SENSITIVITY)   ____________________________________________  EKG   EKG Interpretation  Date/Time:  Wednesday December 25 2021 12:28:32 EDT Ventricular Rate:  55 PR Interval:  140 QRS Duration: 126 QT Interval:  469 QTC  Calculation: 449 R Axis:   -9 Text Interpretation: Sinus rhythm Nonspecific intraventricular conduction delay Nonspecific T abnormalities, anterior leads Confirmed by Nanda Quinton [phone]) on 12/25/2021 12:31:30 PM        ____________________________________________  RADIOLOGY  DG Chest 2 View  Result Date: 12/25/2021 CLINICAL DATA:  Syncopal episode.  Severe sternal pain. EXAM: CHEST - 2 VIEW COMPARISON:  Chest x-ray 10/10/2021 FINDINGS: The cardiac silhouette, mediastinal and hilar contours are normal and stable. The lungs are clear of an acute process. No infiltrates, edema or effusions. No pneumothorax. The lateral film demonstrates a small cortical fracture involving the anterior cortex of the sternum. This is definitely new when compared to the prior chest x-ray. No definite rib fractures. L1 fracture again noted. IMPRESSION: 1. No acute cardiopulmonary findings. 2. Small fracture involving the anterior cortex of the mid sternum. Electronically Signed   By: Marijo Sanes M.D.   On: 12/25/2021 13:27    ____________________________________________   PROCEDURES  Procedure(s) performed:   Procedures  None  ____________________________________________   INITIAL IMPRESSION / ASSESSMENT AND PLAN / ED COURSE  Pertinent labs & imaging results that were available during my care of the patient were reviewed by me and considered in my medical decision making (see chart for details).   This patient is Presenting for Evaluation of syncope, which does require a range of treatment options, and is a complaint that involves a high risk of morbidity and mortality.  The Differential Diagnoses includes cardiogenic syncope, dehydration, hypoglycemia, vasovagal syncope, etc.   I decided to review pertinent External Data, and in summary patient with prior syncope ED presentations.    Clinical Laboratory Tests Ordered, included UA showing evidence of urinary tract infection.  No leukocytosis or  anemia.  No acute kidney injury or electrolyte disturbance.  Troponin normal.  Radiologic Tests Ordered, included CXR. I independently interpreted the images and agree with radiology interpretation.   Cardiac Monitor Tracing which shows NSR. No ectopy.    Social Determinants of Health Risk no smoking history.  Medical Decision Making: Summary:  Patient presents emergency department for evaluation of syncope and hypoglycemia.  She has baseline bradycardia here without high degree block.  No acute ischemic change.  Normal troponins.  No evidence of acute kidney injury or electrolyte disturbance.  She is feeling much better here after fluids.  She does have some evidence of urinary tract infection and plan to treat this with oral antibiotics at home with strict ED return precautions.   Considered admission but ED work-up here is reassuring.  Patient with close PCP and endocrinology follow-up.  Discussed strict ED return precaution.  Disposition: discharge  ____________________________________________  FINAL CLINICAL IMPRESSION(S) / ED DIAGNOSES  Final diagnoses:  Syncope and collapse  Closed fracture of body of sternum, initial encounter  Acute cystitis without hematuria     NEW OUTPATIENT MEDICATIONS STARTED DURING THIS VISIT:  Discharge Medication List as of 12/25/2021  2:44 PM     START taking these medications   Details  cephALEXin (KEFLEX) 500 MG capsule  Take 1 capsule (500 mg total) by mouth 2 (two) times daily for 7 days., Starting Wed 12/25/2021, Until Wed 01/01/2022, Normal    diclofenac Sodium (VOLTAREN) 1 % GEL Apply 2 g topically 4 (four) times daily as needed., Starting Wed 12/25/2021, Normal        Note:  This document was prepared using Dragon voice recognition software and may include unintentional dictation errors.  Nanda Quinton, MD, [facility]    Nelwyn Hebdon, [facility], MD 12/26/21 [phone]

## 2021-12-25 NOTE — ED Notes (Signed)
Notified lab of troponin order

## 2021-12-25 NOTE — ED Triage Notes (Signed)
Reported episodes of syncope x 2 (Friday and last night around 9pm); reported hxof compression fx on L1 & T12 along w/ low blood sugar; reports she is also bradycardic.

## 2021-12-25 NOTE — ED Notes (Signed)
Ambulated independently to BR

## 2021-12-26 ENCOUNTER — Encounter: Payer: Self-pay | Admitting: Oncology

## 2021-12-27 ENCOUNTER — Telehealth (HOSPITAL_COMMUNITY): Payer: Self-pay | Admitting: *Deleted

## 2021-12-27 LAB — URINE CULTURE: Culture: >=100000 — AB

## 2021-12-27 NOTE — Telephone Encounter (Signed)
Reaching out to patient to offer assistance regarding upcoming cardiac imaging study; pt verbalizes understanding of appt date/time, parking situation and where to check in, and verified current allergies; name and call back number provided for further questions should they arise  Gordy Clement RN Navigator [facility] [phone] office [phone] cell  Patient to arrive via Medicaid at 1pm.

## 2021-12-28 ENCOUNTER — Telehealth (HOSPITAL_BASED_OUTPATIENT_CLINIC_OR_DEPARTMENT_OTHER): Payer: Self-pay

## 2021-12-28 NOTE — Telephone Encounter (Signed)
Post ED Visit - Positive Culture Follow-up: Successful Patient Follow-Up  Culture assessed and recommendations reviewed by:  [x]  Letta Median, Pharm.D. []  Heide Guile, Pharm.D., BCPS AQ-ID []  Parks Neptune, Pharm.D., BCPS []  Alycia Rossetti, Pharm.D., BCPS []  [place], Pharm.D., BCPS, AAHIVP []  Legrand Como, Pharm.D., BCPS, AAHIVP []  Salome Arnt, PharmD, BCPS []  Johnnette Gourd, PharmD, BCPS []  Hughes Better, PharmD, BCPS []  Leeroy Cha, PharmD  Positive urine culture  []  Patient discharged without antimicrobial prescription and treatment is now indicated [x]  Organism is resistant to prescribed ED discharge antimicrobial []  Patient with positive blood cultures  Pt denied having fevers   Changes discussed with ED provider: Lajean Saver, MD New antibiotic prescription Bactrim DS 1 tab BID x 3 days  Called to [facility] at [facility] patient, date 12/28/2021, time 12:50 pm   Glennon Hamilton 12/28/2021, 1:03 PM

## 2021-12-28 NOTE — Progress Notes (Signed)
ED Antimicrobial Stewardship Positive Culture Follow Up   Diana Quinn is an 56 y.o. female who presented to [facility] on 12/25/2021 with a chief complaint of  Chief Complaint  Patient presents with   Loss of Consciousness    Recent Results (from the past 720 hour(s))  Urine Culture     Status: Abnormal   Collection Time: 12/25/21  2:35 PM   Specimen: Urine, Clean Catch  Result Value Ref Range Status   Specimen Description   Final    URINE, CLEAN CATCH Performed at [facility], [address] [place], [place] [address]    Special Requests   Final    NONE Performed at [facility], [address] [place], [place] [address]    Culture >=100,000 COLONIES/mL ESCHERICHIA COLI (A)  Final   Report Status 12/27/2021 FINAL  Final   Organism ID, Bacteria ESCHERICHIA COLI (A)  Final      Susceptibility   Escherichia coli - MIC*    AMPICILLIN >=32 RESISTANT Resistant     CEFAZOLIN >=64 RESISTANT Resistant     CEFEPIME <=0.12 SENSITIVE Sensitive     CEFTRIAXONE 8 RESISTANT Resistant     CIPROFLOXACIN <=0.25 SENSITIVE Sensitive     GENTAMICIN <=1 SENSITIVE Sensitive     IMIPENEM <=0.25 SENSITIVE Sensitive     NITROFURANTOIN <=16 SENSITIVE Sensitive     TRIMETH/SULFA <=20 SENSITIVE Sensitive     AMPICILLIN/SULBACTAM >=32 RESISTANT Resistant     PIP/TAZO 16 SENSITIVE Sensitive     * >=100,000 COLONIES/mL ESCHERICHIA COLI    [x]  Treated with Cephalexin, organism resistant to prescribed antimicrobial  STOP Keflex New antibiotic prescription: Bactrim DS take 1 tablet BID x 3 days (Qty 6; Refills 0)  Probe patient to see if having fevers since d/c. Can consider longer course if this is the case.  ED Provider: Lucrezia Starch, PharmD, BCPS 12/28/2021 10:24 AM ED Clinical Pharmacist -  [phone]

## 2021-12-30 ENCOUNTER — Ambulatory Visit (HOSPITAL_COMMUNITY)
Admission: RE | Admit: 2021-12-30 | Discharge: 2021-12-30 | Disposition: A | Discharge status: Home / Self Care | Payer: Medicaid Other | Source: Ambulatory Visit | Attending: Family Medicine | Admitting: Family Medicine

## 2021-12-30 DIAGNOSIS — I2510 Atherosclerotic heart disease of native coronary artery without angina pectoris: Secondary | ICD-10-CM | POA: Diagnosis present

## 2021-12-30 MED ORDER — NITROGLYCERIN 0.4 MG SL SUBL
0.8000 mg | SUBLINGUAL_TABLET | Freq: Once | SUBLINGUAL | Status: AC
  Administered 2021-12-30: 0.8 mg via SUBLINGUAL

## 2021-12-30 MED ORDER — NITROGLYCERIN 0.4 MG SL SUBL
SUBLINGUAL_TABLET | SUBLINGUAL | Status: AC
  Filled 2021-12-30: qty 2

## 2021-12-30 MED ORDER — IOHEXOL 350 MG/ML SOLN
100.0000 mL | Freq: Once | INTRAVENOUS | Status: AC | PRN
  Administered 2021-12-30: 100 mL via INTRAVENOUS

## 2022-01-08 ENCOUNTER — Ambulatory Visit (HOSPITAL_BASED_OUTPATIENT_CLINIC_OR_DEPARTMENT_OTHER): Payer: Medicaid Other | Attending: Orthopedic Surgery

## 2022-01-08 ENCOUNTER — Encounter (HOSPITAL_BASED_OUTPATIENT_CLINIC_OR_DEPARTMENT_OTHER): Payer: Self-pay

## 2022-01-31 ENCOUNTER — Ambulatory Visit (HOSPITAL_COMMUNITY): Payer: Medicaid Other

## 2022-02-10 ENCOUNTER — Ambulatory Visit (HOSPITAL_COMMUNITY): Payer: Medicaid Other

## 2022-02-18 ENCOUNTER — Ambulatory Visit (HOSPITAL_COMMUNITY): Admission: RE | Admit: 2022-02-18 | Payer: Medicaid Other | Source: Ambulatory Visit

## 2022-02-18 ENCOUNTER — Encounter (HOSPITAL_COMMUNITY): Payer: Self-pay

## 2022-08-11 ENCOUNTER — Encounter: Payer: Self-pay | Admitting: Oncology

## 2022-08-19 ENCOUNTER — Ambulatory Visit (INDEPENDENT_AMBULATORY_CARE_PROVIDER_SITE_OTHER): Payer: Medicaid Other | Admitting: Family

## 2022-08-19 ENCOUNTER — Encounter: Payer: Self-pay | Admitting: Family

## 2022-08-19 VITALS — BP 120/64 | HR 54 | Ht 63.0 in | Wt 193.4 lb

## 2022-08-19 DIAGNOSIS — R7302 Impaired glucose tolerance (oral): Secondary | ICD-10-CM

## 2022-08-19 DIAGNOSIS — E559 Vitamin D deficiency, unspecified: Secondary | ICD-10-CM

## 2022-08-19 DIAGNOSIS — G8929 Other chronic pain: Secondary | ICD-10-CM

## 2022-08-19 DIAGNOSIS — E782 Mixed hyperlipidemia: Secondary | ICD-10-CM

## 2022-08-19 DIAGNOSIS — E039 Hypothyroidism, unspecified: Secondary | ICD-10-CM

## 2022-08-19 DIAGNOSIS — D509 Iron deficiency anemia, unspecified: Secondary | ICD-10-CM

## 2022-08-19 DIAGNOSIS — M79604 Pain in right leg: Secondary | ICD-10-CM

## 2022-08-19 DIAGNOSIS — M542 Cervicalgia: Secondary | ICD-10-CM

## 2022-08-19 DIAGNOSIS — M5442 Lumbago with sciatica, left side: Secondary | ICD-10-CM

## 2022-08-19 DIAGNOSIS — M5441 Lumbago with sciatica, right side: Secondary | ICD-10-CM

## 2022-08-19 DIAGNOSIS — D519 Vitamin B12 deficiency anemia, unspecified: Secondary | ICD-10-CM

## 2022-08-19 DIAGNOSIS — M79605 Pain in left leg: Secondary | ICD-10-CM
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
## 2022-08-20 ENCOUNTER — Other Ambulatory Visit: Payer: Self-pay

## 2022-08-20 DIAGNOSIS — G8929 Other chronic pain: Secondary | ICD-10-CM

## 2022-08-21 ENCOUNTER — Other Ambulatory Visit: Payer: Self-pay | Admitting: Nurse Practitioner

## 2022-08-21 ENCOUNTER — Other Ambulatory Visit: Payer: Self-pay | Admitting: Pain Medicine

## 2022-08-21 DIAGNOSIS — G8929 Other chronic pain: Secondary | ICD-10-CM

## 2022-08-25 MED ORDER — CYANOCOBALAMIN 1000 MCG/ML IJ SOLN: 1000.0000 ug | INTRAMUSCULAR | 11 refills | Status: DC

## 2022-08-25 MED ORDER — ATORVASTATIN CALCIUM 40 MG PO TABS: 40.0000 mg | ORAL_TABLET | Freq: Every day | ORAL | 3 refills | Status: DC

## 2022-08-25 MED ORDER — TIZANIDINE HCL 4 MG PO TABS: 4.0000 mg | ORAL_TABLET | Freq: Three times a day (TID) | ORAL | 5 refills | Status: DC

## 2022-08-25 MED ORDER — OXYCODONE-ACETAMINOPHEN 7.5-325 MG PO TABS: 1.0000 | ORAL_TABLET | Freq: Three times a day (TID) | ORAL | 0 refills | Status: DC

## 2022-08-31 ENCOUNTER — Encounter: Payer: Self-pay | Admitting: Family

## 2022-09-09 ENCOUNTER — Emergency Department: Payer: MEDICAID

## 2022-09-09 ENCOUNTER — Encounter: Payer: Self-pay | Admitting: Oncology

## 2022-09-09 ENCOUNTER — Inpatient Hospital Stay
Admission: EM | Admit: 2022-09-09 | Discharge: 2022-09-25 | DRG: 643 | Disposition: A | Discharge status: Rehabilitation Facility | Payer: MEDICAID | Attending: Internal Medicine | Admitting: Internal Medicine

## 2022-09-09 ENCOUNTER — Other Ambulatory Visit: Payer: Self-pay

## 2022-09-09 DIAGNOSIS — G9341 Metabolic encephalopathy: Secondary | ICD-10-CM | POA: Diagnosis not present

## 2022-09-09 DIAGNOSIS — E119 Type 2 diabetes mellitus without complications: Secondary | ICD-10-CM | POA: Diagnosis present

## 2022-09-09 DIAGNOSIS — B9620 Unspecified Escherichia coli [E. coli] as the cause of diseases classified elsewhere: Secondary | ICD-10-CM | POA: Diagnosis present

## 2022-09-09 DIAGNOSIS — F331 Major depressive disorder, recurrent, moderate: Secondary | ICD-10-CM

## 2022-09-09 DIAGNOSIS — S92001A Unspecified fracture of right calcaneus, initial encounter for closed fracture: Secondary | ICD-10-CM | POA: Diagnosis present

## 2022-09-09 DIAGNOSIS — F10139 Alcohol abuse with withdrawal, unspecified: Secondary | ICD-10-CM | POA: Diagnosis not present

## 2022-09-09 DIAGNOSIS — Z825 Family history of asthma and other chronic lower respiratory diseases: Secondary | ICD-10-CM

## 2022-09-09 DIAGNOSIS — F209 Schizophrenia, unspecified: Secondary | ICD-10-CM | POA: Diagnosis present

## 2022-09-09 DIAGNOSIS — Z87898 Personal history of other specified conditions: Secondary | ICD-10-CM

## 2022-09-09 DIAGNOSIS — G8929 Other chronic pain: Secondary | ICD-10-CM | POA: Diagnosis present

## 2022-09-09 DIAGNOSIS — E039 Hypothyroidism, unspecified: Secondary | ICD-10-CM | POA: Diagnosis present

## 2022-09-09 DIAGNOSIS — Z6836 Body mass index (BMI) 36.0-36.9, adult: Secondary | ICD-10-CM

## 2022-09-09 DIAGNOSIS — S8254XA Nondisplaced fracture of medial malleolus of right tibia, initial encounter for closed fracture: Principal | ICD-10-CM | POA: Diagnosis present

## 2022-09-09 DIAGNOSIS — M479 Spondylosis, unspecified: Secondary | ICD-10-CM | POA: Diagnosis present

## 2022-09-09 DIAGNOSIS — Z79899 Other long term (current) drug therapy: Secondary | ICD-10-CM

## 2022-09-09 DIAGNOSIS — E669 Obesity, unspecified: Secondary | ICD-10-CM | POA: Diagnosis present

## 2022-09-09 DIAGNOSIS — Z515 Encounter for palliative care: Secondary | ICD-10-CM

## 2022-09-09 DIAGNOSIS — Z820 Family history of epilepsy and other diseases of the nervous system: Secondary | ICD-10-CM

## 2022-09-09 DIAGNOSIS — F319 Bipolar disorder, unspecified: Secondary | ICD-10-CM | POA: Diagnosis present

## 2022-09-09 DIAGNOSIS — Y929 Unspecified place or not applicable: Secondary | ICD-10-CM

## 2022-09-09 DIAGNOSIS — Z8249 Family history of ischemic heart disease and other diseases of the circulatory system: Secondary | ICD-10-CM

## 2022-09-09 DIAGNOSIS — S82392A Other fracture of lower end of left tibia, initial encounter for closed fracture: Secondary | ICD-10-CM | POA: Diagnosis present

## 2022-09-09 DIAGNOSIS — F4310 Post-traumatic stress disorder, unspecified: Secondary | ICD-10-CM | POA: Diagnosis present

## 2022-09-09 DIAGNOSIS — D509 Iron deficiency anemia, unspecified: Secondary | ICD-10-CM | POA: Diagnosis present

## 2022-09-09 DIAGNOSIS — E871 Hypo-osmolality and hyponatremia: Secondary | ICD-10-CM | POA: Diagnosis not present

## 2022-09-09 DIAGNOSIS — F1123 Opioid dependence with withdrawal: Secondary | ICD-10-CM | POA: Diagnosis not present

## 2022-09-09 DIAGNOSIS — K5900 Constipation, unspecified: Secondary | ICD-10-CM | POA: Diagnosis not present

## 2022-09-09 DIAGNOSIS — G40909 Epilepsy, unspecified, not intractable, without status epilepticus: Secondary | ICD-10-CM | POA: Diagnosis present

## 2022-09-09 DIAGNOSIS — S82891A Other fracture of right lower leg, initial encounter for closed fracture: Secondary | ICD-10-CM

## 2022-09-09 DIAGNOSIS — Z8673 Personal history of transient ischemic attack (TIA), and cerebral infarction without residual deficits: Secondary | ICD-10-CM

## 2022-09-09 DIAGNOSIS — N390 Urinary tract infection, site not specified: Secondary | ICD-10-CM | POA: Diagnosis not present

## 2022-09-09 DIAGNOSIS — M810 Age-related osteoporosis without current pathological fracture: Secondary | ICD-10-CM | POA: Diagnosis present

## 2022-09-09 DIAGNOSIS — G4700 Insomnia, unspecified: Secondary | ICD-10-CM | POA: Diagnosis present

## 2022-09-09 DIAGNOSIS — I2510 Atherosclerotic heart disease of native coronary artery without angina pectoris: Secondary | ICD-10-CM | POA: Diagnosis present

## 2022-09-09 DIAGNOSIS — Z9884 Bariatric surgery status: Secondary | ICD-10-CM

## 2022-09-09 DIAGNOSIS — M549 Dorsalgia, unspecified: Secondary | ICD-10-CM | POA: Diagnosis present

## 2022-09-09 DIAGNOSIS — F419 Anxiety disorder, unspecified: Secondary | ICD-10-CM | POA: Diagnosis present

## 2022-09-09 DIAGNOSIS — W1830XA Fall on same level, unspecified, initial encounter: Secondary | ICD-10-CM | POA: Diagnosis present

## 2022-09-09 DIAGNOSIS — I252 Old myocardial infarction: Secondary | ICD-10-CM

## 2022-09-09 DIAGNOSIS — S82302A Unspecified fracture of lower end of left tibia, initial encounter for closed fracture: Secondary | ICD-10-CM

## 2022-09-09 DIAGNOSIS — Z79890 Hormone replacement therapy (postmenopausal): Secondary | ICD-10-CM

## 2022-09-09 DIAGNOSIS — S82899A Other fracture of unspecified lower leg, initial encounter for closed fracture: Secondary | ICD-10-CM | POA: Diagnosis present

## 2022-09-09 LAB — CBC
HCT: 36.4 % (ref 36.0–46.0)
Hemoglobin: 11.7 g/dL — ABNORMAL LOW (ref 12.0–15.0)
MCH: 32.5 pg (ref 26.0–34.0)
MCHC: 32.1 g/dL (ref 30.0–36.0)
MCV: 101.1 fL — ABNORMAL HIGH (ref 80.0–100.0)
Platelets: 380 10*3/uL (ref 150–400)
RBC: 3.6 MIL/uL — ABNORMAL LOW (ref 3.87–5.11)
RDW: 13.9 % (ref 11.5–15.5)
WBC: 8.4 10*3/uL (ref 4.0–10.5)
nRBC: 0 % (ref 0.0–0.2)

## 2022-09-09 LAB — BASIC METABOLIC PANEL
Anion gap: 6 (ref 5–15)
BUN: 17 mg/dL (ref 6–20)
CO2: 24 mmol/L (ref 22–32)
Calcium: 8.2 mg/dL — ABNORMAL LOW (ref 8.9–10.3)
Chloride: 105 mmol/L (ref 98–111)
Creatinine, Ser: 0.83 mg/dL (ref 0.44–1.00)
GFR, Estimated: >60 mL/min (ref >60)
Glucose, Bld: 90 mg/dL (ref 70–99)
Potassium: 4.1 mmol/L (ref 3.5–5.1)
Sodium: 135 mmol/L (ref 135–145)

## 2022-09-09 MED ORDER — FENTANYL CITRATE PF 50 MCG/ML IJ SOSY
50.0000 ug | PREFILLED_SYRINGE | Freq: Once | INTRAMUSCULAR | Status: AC
  Administered 2022-09-09: 50 ug via INTRAVENOUS
  Filled 2022-09-09: qty 1

## 2022-09-09 MED ORDER — ONDANSETRON HCL 4 MG/2ML IJ SOLN
4.0000 mg | Freq: Once | INTRAMUSCULAR | Status: AC
  Administered 2022-09-09: 4 mg via INTRAVENOUS
  Filled 2022-09-09: qty 2

## 2022-09-09 MED ORDER — MORPHINE SULFATE (PF) 4 MG/ML IV SOLN
4.0000 mg | Freq: Once | INTRAVENOUS | Status: AC
  Administered 2022-09-09: 4 mg via INTRAVENOUS
  Filled 2022-09-09: qty 1

## 2022-09-09 NOTE — ED Triage Notes (Signed)
Pt presents to ER via ems from home with c/o mechanical fall.  Pt reportedly was walking, and both of her ankles rolled inwards, and pt reports hearing a "pop" from her ankles.  Pt has deformity noted to left ankle on arrival.  Pedal pulses present BIL.  Pt received total of 100 mcg IM fentanyl in her L deltoid, and left hip.  Pt is otherwise A&O x4 and in NAD.    Pt does have recent hx of more frequent falls per ems.

## 2022-09-10 DIAGNOSIS — Z515 Encounter for palliative care: Secondary | ICD-10-CM | POA: Diagnosis not present

## 2022-09-10 DIAGNOSIS — S82891A Other fracture of right lower leg, initial encounter for closed fracture: Secondary | ICD-10-CM

## 2022-09-10 DIAGNOSIS — E119 Type 2 diabetes mellitus without complications: Secondary | ICD-10-CM

## 2022-09-10 DIAGNOSIS — Z79890 Hormone replacement therapy (postmenopausal): Secondary | ICD-10-CM | POA: Diagnosis not present

## 2022-09-10 DIAGNOSIS — S82899A Other fracture of unspecified lower leg, initial encounter for closed fracture: Secondary | ICD-10-CM | POA: Diagnosis not present

## 2022-09-10 DIAGNOSIS — Z79899 Other long term (current) drug therapy: Secondary | ICD-10-CM | POA: Diagnosis not present

## 2022-09-10 DIAGNOSIS — Z87898 Personal history of other specified conditions: Secondary | ICD-10-CM | POA: Diagnosis not present

## 2022-09-10 DIAGNOSIS — G4700 Insomnia, unspecified: Secondary | ICD-10-CM | POA: Diagnosis present

## 2022-09-10 DIAGNOSIS — S82302A Unspecified fracture of lower end of left tibia, initial encounter for closed fracture: Secondary | ICD-10-CM | POA: Diagnosis not present

## 2022-09-10 DIAGNOSIS — Z8249 Family history of ischemic heart disease and other diseases of the circulatory system: Secondary | ICD-10-CM | POA: Diagnosis not present

## 2022-09-10 DIAGNOSIS — S82302S Unspecified fracture of lower end of left tibia, sequela: Secondary | ICD-10-CM | POA: Diagnosis not present

## 2022-09-10 DIAGNOSIS — F10139 Alcohol abuse with withdrawal, unspecified: Secondary | ICD-10-CM | POA: Diagnosis not present

## 2022-09-10 DIAGNOSIS — M25572 Pain in left ankle and joints of left foot: Secondary | ICD-10-CM | POA: Diagnosis present

## 2022-09-10 DIAGNOSIS — N390 Urinary tract infection, site not specified: Secondary | ICD-10-CM | POA: Diagnosis not present

## 2022-09-10 DIAGNOSIS — F419 Anxiety disorder, unspecified: Secondary | ICD-10-CM | POA: Diagnosis present

## 2022-09-10 DIAGNOSIS — E039 Hypothyroidism, unspecified: Secondary | ICD-10-CM | POA: Diagnosis present

## 2022-09-10 DIAGNOSIS — S82892S Other fracture of left lower leg, sequela: Secondary | ICD-10-CM | POA: Diagnosis not present

## 2022-09-10 DIAGNOSIS — R0609 Other forms of dyspnea: Secondary | ICD-10-CM | POA: Diagnosis not present

## 2022-09-10 DIAGNOSIS — D509 Iron deficiency anemia, unspecified: Secondary | ICD-10-CM | POA: Diagnosis present

## 2022-09-10 DIAGNOSIS — S82891D Other fracture of right lower leg, subsequent encounter for closed fracture with routine healing: Secondary | ICD-10-CM | POA: Diagnosis not present

## 2022-09-10 DIAGNOSIS — F3162 Bipolar disorder, current episode mixed, moderate: Secondary | ICD-10-CM | POA: Diagnosis not present

## 2022-09-10 DIAGNOSIS — F1123 Opioid dependence with withdrawal: Secondary | ICD-10-CM | POA: Diagnosis not present

## 2022-09-10 DIAGNOSIS — E871 Hypo-osmolality and hyponatremia: Secondary | ICD-10-CM | POA: Diagnosis not present

## 2022-09-10 DIAGNOSIS — F209 Schizophrenia, unspecified: Secondary | ICD-10-CM | POA: Diagnosis present

## 2022-09-10 DIAGNOSIS — S82892D Other fracture of left lower leg, subsequent encounter for closed fracture with routine healing: Secondary | ICD-10-CM | POA: Diagnosis not present

## 2022-09-10 DIAGNOSIS — Z7189 Other specified counseling: Secondary | ICD-10-CM | POA: Diagnosis not present

## 2022-09-10 DIAGNOSIS — S8254XA Nondisplaced fracture of medial malleolus of right tibia, initial encounter for closed fracture: Secondary | ICD-10-CM | POA: Diagnosis present

## 2022-09-10 DIAGNOSIS — I2510 Atherosclerotic heart disease of native coronary artery without angina pectoris: Secondary | ICD-10-CM | POA: Diagnosis present

## 2022-09-10 DIAGNOSIS — S82892A Other fracture of left lower leg, initial encounter for closed fracture: Secondary | ICD-10-CM | POA: Diagnosis not present

## 2022-09-10 DIAGNOSIS — G40909 Epilepsy, unspecified, not intractable, without status epilepticus: Secondary | ICD-10-CM | POA: Diagnosis present

## 2022-09-10 DIAGNOSIS — E669 Obesity, unspecified: Secondary | ICD-10-CM | POA: Diagnosis present

## 2022-09-10 DIAGNOSIS — G9341 Metabolic encephalopathy: Secondary | ICD-10-CM | POA: Diagnosis not present

## 2022-09-10 DIAGNOSIS — S82891S Other fracture of right lower leg, sequela: Secondary | ICD-10-CM | POA: Diagnosis not present

## 2022-09-10 DIAGNOSIS — G8929 Other chronic pain: Secondary | ICD-10-CM | POA: Diagnosis present

## 2022-09-10 DIAGNOSIS — W1830XA Fall on same level, unspecified, initial encounter: Secondary | ICD-10-CM | POA: Diagnosis present

## 2022-09-10 DIAGNOSIS — F319 Bipolar disorder, unspecified: Secondary | ICD-10-CM | POA: Diagnosis present

## 2022-09-10 DIAGNOSIS — S82302D Unspecified fracture of lower end of left tibia, subsequent encounter for closed fracture with routine healing: Secondary | ICD-10-CM | POA: Diagnosis not present

## 2022-09-10 DIAGNOSIS — Y929 Unspecified place or not applicable: Secondary | ICD-10-CM | POA: Diagnosis not present

## 2022-09-10 DIAGNOSIS — B9620 Unspecified Escherichia coli [E. coli] as the cause of diseases classified elsewhere: Secondary | ICD-10-CM | POA: Diagnosis present

## 2022-09-10 DIAGNOSIS — I950 Idiopathic hypotension: Secondary | ICD-10-CM | POA: Diagnosis not present

## 2022-09-10 DIAGNOSIS — F331 Major depressive disorder, recurrent, moderate: Secondary | ICD-10-CM | POA: Diagnosis not present

## 2022-09-10 DIAGNOSIS — S82392A Other fracture of lower end of left tibia, initial encounter for closed fracture: Secondary | ICD-10-CM | POA: Diagnosis present

## 2022-09-10 DIAGNOSIS — Z6836 Body mass index (BMI) 36.0-36.9, adult: Secondary | ICD-10-CM | POA: Diagnosis not present

## 2022-09-10 HISTORY — DX: Unspecified fracture of lower end of left tibia, initial encounter for closed fracture: S82.302A

## 2022-09-10 HISTORY — DX: Other fracture of unspecified lower leg, initial encounter for closed fracture: S82.899A

## 2022-09-10 LAB — GLUCOSE, CAPILLARY
Glucose-Capillary: 168 mg/dL — ABNORMAL HIGH (ref 70–99)
Glucose-Capillary: 200 mg/dL — ABNORMAL HIGH (ref 70–99)
Glucose-Capillary: 120 mg/dL — ABNORMAL HIGH (ref 70–99)
Glucose-Capillary: 108 mg/dL — ABNORMAL HIGH (ref 70–99)
Glucose-Capillary: 131 mg/dL — ABNORMAL HIGH (ref 70–99)

## 2022-09-10 LAB — CBC
HCT: 42.5 % (ref 36.0–46.0)
Hemoglobin: 13.7 g/dL (ref 12.0–15.0)
MCH: 32.4 pg (ref 26.0–34.0)
MCHC: 32.2 g/dL (ref 30.0–36.0)
MCV: 100.5 fL — ABNORMAL HIGH (ref 80.0–100.0)
Platelets: 441 10*3/uL — ABNORMAL HIGH (ref 150–400)
RBC: 4.23 MIL/uL (ref 3.87–5.11)
RDW: 14 % (ref 11.5–15.5)
WBC: 13.4 10*3/uL — ABNORMAL HIGH (ref 4.0–10.5)
nRBC: 0 % (ref 0.0–0.2)

## 2022-09-10 LAB — HEMOGLOBIN A1C
Hgb A1c MFr Bld: 5.5 % (ref 4.8–5.6)
Mean Plasma Glucose: 111.15 mg/dL

## 2022-09-10 LAB — PROTIME-INR
INR: 1 (ref 0.8–1.2)
Prothrombin Time: 13.3 seconds (ref 11.4–15.2)

## 2022-09-10 LAB — COMPREHENSIVE METABOLIC PANEL
ALT: 20 U/L (ref 0–44)
AST: 43 U/L — ABNORMAL HIGH (ref 15–41)
Albumin: 3.9 g/dL (ref 3.5–5.0)
Alkaline Phosphatase: 209 U/L — ABNORMAL HIGH (ref 38–126)
Anion gap: 9 (ref 5–15)
BUN: 16 mg/dL (ref 6–20)
CO2: 22 mmol/L (ref 22–32)
Calcium: 9 mg/dL (ref 8.9–10.3)
Chloride: 105 mmol/L (ref 98–111)
Creatinine, Ser: 0.8 mg/dL (ref 0.44–1.00)
GFR, Estimated: >60 mL/min (ref >60)
Glucose, Bld: 86 mg/dL (ref 70–99)
Potassium: 4 mmol/L (ref 3.5–5.1)
Sodium: 136 mmol/L (ref 135–145)
Total Bilirubin: 0.6 mg/dL (ref 0.3–1.2)
Total Protein: 7.4 g/dL (ref 6.5–8.1)

## 2022-09-10 LAB — TSH: TSH: 0.667 u[IU]/mL (ref 0.350–4.500)

## 2022-09-10 LAB — HIV ANTIBODY (ROUTINE TESTING W REFLEX): HIV Screen 4th Generation wRfx: NONREACTIVE

## 2022-09-10 MED ORDER — SODIUM CHLORIDE 0.9 % IV SOLN: INTRAVENOUS | Status: AC

## 2022-09-10 MED ORDER — OXYCODONE-ACETAMINOPHEN 7.5-325 MG PO TABS
1.0000 | ORAL_TABLET | Freq: Three times a day (TID) | ORAL | Status: DC
  Administered 2022-09-10 (×2): 1 via ORAL
  Filled 2022-09-10 (×2): qty 1

## 2022-09-10 MED ORDER — LEVOTHYROXINE SODIUM 112 MCG PO TABS
112.0000 ug | ORAL_TABLET | Freq: Every day | ORAL | Status: DC
  Administered 2022-09-10 – 2022-09-25 (×16): 112 ug via ORAL
  Filled 2022-09-10 (×16): qty 1

## 2022-09-10 MED ORDER — INSULIN ASPART 100 UNIT/ML IJ SOLN
0.0000 [IU] | Freq: Three times a day (TID) | INTRAMUSCULAR | Status: DC
  Administered 2022-09-12 – 2022-09-17 (×4): 1 [IU] via SUBCUTANEOUS
  Administered 2022-09-21: 2 [IU] via SUBCUTANEOUS
  Administered 2022-09-22 – 2022-09-24 (×2): 1 [IU] via SUBCUTANEOUS
  Filled 2022-09-10 (×8): qty 1

## 2022-09-10 MED ORDER — SUVOREXANT 20 MG PO TABS: 1.0000 | ORAL_TABLET | Freq: Every day | ORAL | Status: DC

## 2022-09-10 MED ORDER — QUETIAPINE FUMARATE 300 MG PO TABS
300.0000 mg | ORAL_TABLET | Freq: Every day | ORAL | Status: DC
  Administered 2022-09-10 – 2022-09-24 (×14): 300 mg via ORAL
  Filled 2022-09-10 (×16): qty 1

## 2022-09-10 MED ORDER — ONDANSETRON HCL 4 MG PO TABS
4.0000 mg | ORAL_TABLET | Freq: Four times a day (QID) | ORAL | Status: DC | PRN
  Administered 2022-09-20: 4 mg via ORAL
  Filled 2022-09-10: qty 1

## 2022-09-10 MED ORDER — ENOXAPARIN SODIUM 40 MG/0.4ML IJ SOSY
40.0000 mg | PREFILLED_SYRINGE | INTRAMUSCULAR | Status: DC
  Administered 2022-09-10 – 2022-09-24 (×15): 40 mg via SUBCUTANEOUS
  Filled 2022-09-10 (×15): qty 0.4

## 2022-09-10 MED ORDER — HYDROMORPHONE HCL 1 MG/ML IJ SOLN
0.5000 mg | INTRAMUSCULAR | Status: DC | PRN
  Administered 2022-09-10 (×3): 0.5 mg via INTRAVENOUS
  Filled 2022-09-10 (×3): qty 0.5

## 2022-09-10 MED ORDER — CLONAZEPAM 1 MG PO TABS
1.0000 mg | ORAL_TABLET | Freq: Every day | ORAL | Status: DC | PRN
  Administered 2022-09-12 – 2022-09-13 (×2): 1 mg via ORAL
  Filled 2022-09-10 (×2): qty 1

## 2022-09-10 MED ORDER — SODIUM CHLORIDE 0.9 % IV BOLUS
500.0000 mL | Freq: Once | INTRAVENOUS | Status: AC
  Administered 2022-09-10: 500 mL via INTRAVENOUS

## 2022-09-10 MED ORDER — INSULIN ASPART 100 UNIT/ML IJ SOLN: 0.0000 [IU] | Freq: Every day | INTRAMUSCULAR | Status: DC

## 2022-09-10 MED ORDER — IBUPROFEN 400 MG PO TABS
600.0000 mg | ORAL_TABLET | Freq: Four times a day (QID) | ORAL | Status: DC
  Administered 2022-09-10 – 2022-09-15 (×15): 600 mg via ORAL
  Filled 2022-09-10 (×15): qty 2

## 2022-09-10 MED ORDER — OXCARBAZEPINE 300 MG PO TABS
1200.0000 mg | ORAL_TABLET | Freq: Two times a day (BID) | ORAL | Status: DC
  Administered 2022-09-10 – 2022-09-13 (×7): 1200 mg via ORAL
  Filled 2022-09-10 (×8): qty 4

## 2022-09-10 MED ORDER — LUMATEPERONE TOSYLATE 42 MG PO CAPS
42.0000 mg | ORAL_CAPSULE | Freq: Every day | ORAL | Status: DC
  Administered 2022-09-10 – 2022-09-13 (×4): 42 mg via ORAL
  Filled 2022-09-10 (×4): qty 1

## 2022-09-10 MED ORDER — ATORVASTATIN CALCIUM 20 MG PO TABS
40.0000 mg | ORAL_TABLET | Freq: Every day | ORAL | Status: DC
  Administered 2022-09-10 – 2022-09-25 (×16): 40 mg via ORAL
  Filled 2022-09-10 (×16): qty 2

## 2022-09-10 MED ORDER — PAROXETINE HCL 20 MG PO TABS
40.0000 mg | ORAL_TABLET | Freq: Every day | ORAL | Status: DC
  Administered 2022-09-10 – 2022-09-25 (×16): 40 mg via ORAL
  Filled 2022-09-10 (×16): qty 2

## 2022-09-10 MED ORDER — RANOLAZINE ER 500 MG PO TB12
1000.0000 mg | ORAL_TABLET | Freq: Two times a day (BID) | ORAL | Status: DC
  Administered 2022-09-10 – 2022-09-25 (×31): 1000 mg via ORAL
  Filled 2022-09-10 (×31): qty 2

## 2022-09-10 MED ORDER — OXYCODONE-ACETAMINOPHEN 7.5-325 MG PO TABS: 1.0000 | ORAL_TABLET | Freq: Four times a day (QID) | ORAL | Status: DC | PRN

## 2022-09-10 MED ORDER — ONDANSETRON HCL 4 MG/2ML IJ SOLN
4.0000 mg | Freq: Four times a day (QID) | INTRAMUSCULAR | Status: DC | PRN
  Administered 2022-09-11 (×2): 4 mg via INTRAVENOUS
  Filled 2022-09-10 (×3): qty 2

## 2022-09-10 MED ORDER — SODIUM CHLORIDE 0.9 % IV BOLUS
250.0000 mL | Freq: Once | INTRAVENOUS | Status: AC
  Administered 2022-09-10: 250 mL via INTRAVENOUS

## 2022-09-10 MED ORDER — PRAZOSIN HCL 2 MG PO CAPS
2.0000 mg | ORAL_CAPSULE | Freq: Every day | ORAL | Status: DC
  Administered 2022-09-10 – 2022-09-24 (×13): 2 mg via ORAL
  Filled 2022-09-10 (×16): qty 1

## 2022-09-10 MED ORDER — HYDROMORPHONE HCL 1 MG/ML IJ SOLN: 1.0000 mg | INTRAMUSCULAR | Status: DC | PRN

## 2022-09-10 MED ORDER — HYDROMORPHONE HCL 1 MG/ML IJ SOLN
0.5000 mg | Freq: Once | INTRAMUSCULAR | Status: AC
  Administered 2022-09-10: 0.5 mg via INTRAVENOUS
  Filled 2022-09-10: qty 0.5

## 2022-09-10 MED ORDER — PROPRANOLOL HCL 20 MG PO TABS
20.0000 mg | ORAL_TABLET | Freq: Two times a day (BID) | ORAL | Status: DC
  Administered 2022-09-10 (×2): 20 mg via ORAL
  Filled 2022-09-10 (×2): qty 1

## 2022-09-10 MED ORDER — PANTOPRAZOLE SODIUM 40 MG PO TBEC
40.0000 mg | DELAYED_RELEASE_TABLET | Freq: Every day | ORAL | Status: DC
  Administered 2022-09-10 – 2022-09-25 (×16): 40 mg via ORAL
  Filled 2022-09-10 (×16): qty 1

## 2022-09-10 MED ORDER — HYDROMORPHONE HCL 1 MG/ML IJ SOLN: 1.0000 mg | Freq: Once | INTRAMUSCULAR | Status: DC

## 2022-09-10 MED ORDER — ALBUTEROL SULFATE (2.5 MG/3ML) 0.083% IN NEBU: 2.5000 mg | INHALATION_SOLUTION | RESPIRATORY_TRACT | Status: DC | PRN

## 2022-09-10 MED ORDER — TIZANIDINE HCL 4 MG PO TABS
4.0000 mg | ORAL_TABLET | Freq: Three times a day (TID) | ORAL | Status: DC
  Administered 2022-09-10 – 2022-09-25 (×40): 4 mg via ORAL
  Filled 2022-09-10 (×41): qty 1

## 2022-09-10 MED ORDER — TRAZODONE HCL 100 MG PO TABS
300.0000 mg | ORAL_TABLET | Freq: Every day | ORAL | Status: DC
  Administered 2022-09-10 – 2022-09-24 (×14): 300 mg via ORAL
  Filled 2022-09-10 (×15): qty 3

## 2022-09-10 MED ORDER — MELATONIN 5 MG PO TABS
5.0000 mg | ORAL_TABLET | Freq: Every evening | ORAL | Status: DC | PRN
  Administered 2022-09-17: 5 mg via ORAL
  Filled 2022-09-10: qty 1

## 2022-09-10 NOTE — Progress Notes (Signed)
   09/10/22 1915  Assess: MEWS Score  Temp 98 F (36.7 C)  BP (!) 79/65  MAP (mmHg) 71  Pulse Rate 60  Resp 15  Level of Consciousness Alert  SpO2 95 %  O2 Device Nasal Cannula  O2 Flow Rate (L/min) 2 L/min   No improvement on BP. Notified attending MD. Per MD, give 500 mL bolus  of normal saline at this time. Reported to incoming nurse.

## 2022-09-10 NOTE — Consult Note (Signed)
  Subjective:  Patient ID: Diana Quinn, female    DOB: 1966/01/25,  MRN: [id number]  A 57 y.o. female presents with  medical history significant of  PTSD/Bipolar/Schizophrenia, anxiety,Osteoporosis, DMII with history of hypogylcemia, CAD s/p MI with non-obstructive CAD on Cath per patient, Seizure d/o djd for the back , TIA , hx of gastric bypass for treatment of obesity with bilateral ankle fracture.  She states that she was walking her ankle gave out and she may have broken it.  She has a history of breaks in the past which none of them require surgery. Objective:   Vitals:   09/10/22 0349 09/10/22 0812  BP: 122/65 (!) 100/51  Pulse: (!) 57 70  Resp: 18 16  Temp: 98.3 F (36.8 C) 99.6 F (37.6 C)  SpO2: 100% 99%   General AA&O x3. Normal mood and affect.  Vascular Dorsalis pedis and posterior tibial pulses 2/4 bilat. Brisk capillary refill to all digits. Pedal hair present.  Neurologic Epicritic sensation grossly intact.  Dermatologic Bilateral ankle swelling noted patient is currently in a posterior splint.  No calf pain.  Pain around the ankle.  No hemorrhagic blister or fracture blisters noted.  Orthopedic: MMT 5/5 in dorsiflexion, plantarflexion, inversion, and eversion. Normal joint ROM without pain or crepitus.   MPRESSION: Oblique fracture through the distal tibial metaphysis.   Minimally displaced fracture through the anterior process of the calcaneus.  IMPRESSION: Nondisplaced fracture through the medial malleolus and distal lateral calcaneus.   Probable avulsed fragments off the lateral malleolus and/or lateral talus.   Widening of the lateral ankle mortise.   Assessment & Plan:  Patient was evaluated and treated and all questions answered.  Left distal tibial fracture nondisplaced and right nondisplaced ankle fracture -All questions and concerns were discussed with the patient in extensive detail -At this time patient will benefit from conservative care with  bilateral nonweightbearing for 4 to 6 weeks followed by weightbearing as tolerated in cam boot. -No surgical plans are indicated at this time -Patient is okay to be discharged from podiatric standpoint. -CT scan was reviewed with the patient in extensive detail.  Ankle mortise within normal limits bilaterally with nondisplaced fractures -Patient will benefit from nursing facility due to bilateral nonweightbearing status -She will come see [place] in clinic 2 weeks from discharge  Candelaria Stagers, DPM  Accessible via secure chat for questions or concerns.

## 2022-09-10 NOTE — Plan of Care (Signed)
  Problem: Education: Goal: Knowledge of General Education information will improve Description: Including pain rating scale, medication(s)/side effects and non-pharmacologic comfort measures Outcome: Progressing   Problem: Activity: Goal: Risk for activity intolerance will decrease Outcome: Progressing   Problem: Pain Managment: Goal: General experience of comfort will improve Outcome: Progressing   Problem: Safety: Goal: Ability to remain free from injury will improve Outcome: Progressing   Problem: Skin Integrity: Goal: Risk for impaired skin integrity will decrease Outcome: Progressing   Problem: Elimination: Goal: Will not experience complications related to bowel motility Outcome: Progressing   

## 2022-09-10 NOTE — Progress Notes (Signed)
   09/10/22 1813  Assess: MEWS Score  Temp 97.9 F (36.6 C)  BP (!) 80/40  MAP (mmHg) (!) 50  Pulse Rate (!) 57  Resp 15  Level of Consciousness Alert  SpO2 94 %  O2 Device Room Air  Assess: MEWS Score  MEWS Temp 0  MEWS Systolic 2  MEWS Pulse 0  MEWS RR 0  MEWS LOC 0  MEWS Score 2  MEWS Score Color Yellow  Assess: if the MEWS score is Yellow or Red  Were vital signs taken at a resting state? Yes  Focused Assessment Change from prior assessment (see assessment flowsheet)  Does the patient meet 2 or more of the SIRS criteria? No  MEWS guidelines implemented  Yes, yellow  Treat  MEWS Interventions Considered administering scheduled or prn medications/treatments as ordered  Take Vital Signs  Increase Vital Sign Frequency  Yellow: Q2hr x1, continue Q4hrs until patient remains green for 12hrs  Escalate  MEWS: Escalate Yellow: Discuss with charge nurse and consider notifying provider and/or RRT  Notify: Charge Nurse/RN  Name of Charge Nurse/RN Notified Bre Salena Saner RN  Provider Notification  Provider Name/Title Dr. Sherryll Burger  Date Provider Notified 09/10/22  Time Provider Notified 1831  Method of Notification Page  Notification Reason Other (Comment) (Change in blood pressure)  Provider response See new orders  Date of Provider Response 09/10/22  Time of Provider Response 1831  Assess: SIRS CRITERIA  SIRS Temperature  0  SIRS Pulse 0  SIRS Respirations  0  SIRS WBC 0  SIRS Score Sum  0

## 2022-09-10 NOTE — Progress Notes (Signed)
PROGRESS NOTE    Diana Quinn  ZOX:[id number] DOB: [birth year] DOA: 09/09/2022 PCP: Orson Eva, NP (Confirm with patient/family/NH records and if not entered, this HAS to be entered at [facility] point of entry. "No PCP" if truly none.)   Brief Narrative: (Start on day 1 of progress note - keep it brief and live) 57 y.o. female with medical history significant of  PTSD/Bipolar/Schizophrenia, anxiety,Osteoporosis, DMII with history of hypogylcemia, CAD s/p MI with non-obstructive CAD on Cath per patient, Seizure d/o djd for the back , TIA , hx of gastric bypass for treatment of obesity.  She is admitted for nondisplaced medial malleolus and distal lateral calcaneus fracture s/p mechanical fall  7/3: Podiatry consult  Assessment & Plan:   Principal Problem:   Ankle fracture Active Problems:   Closed fracture of left distal tibia   Left distal tibial fracture/nondisplaced and right nondisplaced ankle fracture -Appreciate dietary input Conservative management plan.  Nonweightbearing bilaterally for 4 to 6 weeks followed by weightbearing as tolerated in cam boot PT and OT evaluation recommends SNF.  TOC working on placement   PTSD/Bipolar/Anxiety  -Continue Paxil, Seroquel, trazodone, Caplyta   Osteoporosis  - on fosamax  -   DMII -Sliding scale insulin for now - A1c 5.5   CAD s/p MI  -CAD non-obstructive but per patient cath noted small caliber vessels  -continue ranxena   DJD of the back  -chronic back pain  -follows with pcp and pain management    Hypothyroidism -Continue synthroid   TIA hx    Obesity  -s/p gastric bypass    Hx if Seizure d/o  -on trileptal   DVT prophylaxis: Lovenox      Code Status: Full code Family Communication: None at bedside Disposition Plan: Medically stable, SNF when bed available   Consultants:  Podiatry  Subjective: Complaining of pain.  Agreeable for rehab  Objective: Vitals:   09/10/22 0300 09/10/22 0302 09/10/22 0349  09/10/22 0812  BP: (!) 118/93  122/65 (!) 100/51  Pulse: (!) 57  (!) 57 70  Resp:   18 16  Temp:  98.1 F (36.7 C) 98.3 F (36.8 C) 99.6 F (37.6 C)  TempSrc:  Oral  Oral  SpO2: 98%  100% 99%  Weight:    82.5 kg  Height:    5\' 3"  (1.6 m)    Intake/Output Summary (Last 24 hours) at 09/10/2022 1558 Last data filed at 09/10/2022 1510 Gross per 24 hour  Intake 1054.38 ml  Output --  Net 1054.38 ml   Filed Weights   09/09/22 2148 09/10/22 0812  Weight: 82.6 kg 82.5 kg    Examination:  General exam: Appears calm and comfortable  Respiratory system: Clear to auscultation. Respiratory effort normal. Cardiovascular system: S1 & S2 heard, RRR. No JVD, murmurs, rubs, gallops or clicks. No pedal edema. Gastrointestinal system: Abdomen is nondistended, soft and nontender. No organomegaly or masses felt. Normal bowel sounds heard. Central nervous system: Alert and oriented. No focal neurological deficits. Extremities: Ankle splint in place Skin: No rashes, lesions or ulcers Psychiatry: Judgement and insight appear normal. Mood & affect appropriate.     Data Reviewed: I have personally reviewed following labs and imaging studies  CBC: Recent Labs  Lab 09/09/22 2335 09/10/22 0515  WBC 8.4 13.4*  HGB 11.7* 13.7  HCT 36.4 42.5  MCV 101.1* 100.5*  PLT 380 441*   Basic Metabolic Panel: Recent Labs  Lab 09/09/22 2335 09/10/22 0515  NA 135 136  K 4.1 4.0  CL  105 105  CO2 24 22  GLUCOSE 90 86  BUN 17 16  CREATININE 0.83 0.80  CALCIUM 8.2* 9.0   GFR: Estimated Creatinine Clearance: 81.7 mL/min (by C-G formula based on SCr of 0.8 mg/dL). Liver Function Tests: Recent Labs  Lab 09/10/22 0515  AST 43*  ALT 20  ALKPHOS 209*  BILITOT 0.6  PROT 7.4  ALBUMIN 3.9   Coagulation Profile: Recent Labs  Lab 09/10/22 0515  INR 1.0   HbA1C: Recent Labs    09/09/22 2335  HGBA1C 5.5   CBG: Recent Labs  Lab 09/10/22 0928 09/10/22 1159 09/10/22 1549  GLUCAP 168* 200*  120*   Thyroid Function Tests: Recent Labs    09/09/22 2335  TSH 0.667     Radiology Studies: CT Ankle Left Wo Contrast  Result Date: 09/10/2022 CLINICAL DATA:  Ankle trauma, fracture, xray done (Age >= 5y) EXAM: CT OF THE LEFT ANKLE WITHOUT CONTRAST TECHNIQUE: Multidetector CT imaging of the left ankle was performed according to the standard protocol. Multiplanar CT image reconstructions were also generated. RADIATION DOSE REDUCTION: This exam was performed according to the departmental dose-optimization program which includes automated exposure control, adjustment of the mA and/or kV according to patient size and/or use of iterative reconstruction technique. COMPARISON:  Plain films today FINDINGS: Bones/Joint/Cartilage There is an oblique fracture through the distal tibial metaphysis. No visible fibular fracture. Ankle mortise appears intact. Fracture through the anterior process of the calcaneus. Well corticated bone fragments noted along the anterior surface of the distal talus. Ligaments Suboptimally assessed by CT. Muscles and Tendons Grossly unremarkable Soft tissues Soft tissue swelling in the lower calf and ankle region. IMPRESSION: Oblique fracture through the distal tibial metaphysis. Minimally displaced fracture through the anterior process of the calcaneus. Electronically Signed   By: Charlett Nose M.D.   On: 09/10/2022 00:15   CT Ankle Right Wo Contrast  Result Date: 09/10/2022 CLINICAL DATA:  Ankle pain, stress fracture suspected, neg xray. Fall. EXAM: CT OF THE RIGHT ANKLE WITHOUT CONTRAST TECHNIQUE: Multidetector CT imaging of the right ankle was performed according to the standard protocol. Multiplanar CT image reconstructions were also generated. RADIATION DOSE REDUCTION: This exam was performed according to the departmental dose-optimization program which includes automated exposure control, adjustment of the mA and/or kV according to patient size and/or use of iterative  reconstruction technique. COMPARISON:  None Available. FINDINGS: Bones/Joint/Cartilage There is an oblique nondisplaced fracture through the right medial malleolus. Multiple small bone fragments adjacent to the tip of the lateral malleolus and lateral talus, many of which appear well corticated and chronic, but others are likely acute. Widening of the lateral ankle mortise. Nondisplaced fracture noted through the distal calcaneus laterally near the calcaneocuboid joint. Ligaments Suboptimally assessed by CT. Muscles and Tendons Grossly unremarkable. Soft tissues Soft tissue swelling diffusely, most pronounced laterally. IMPRESSION: Nondisplaced fracture through the medial malleolus and distal lateral calcaneus. Probable avulsed fragments off the lateral malleolus and/or lateral talus. Widening of the lateral ankle mortise. Electronically Signed   By: Charlett Nose M.D.   On: 09/10/2022 00:12   DG Ankle Complete Right  Result Date: 09/09/2022 CLINICAL DATA:  Mechanical fall.  Ankle pain and deformity. EXAM: RIGHT ANKLE - COMPLETE 3+ VIEW COMPARISON:  None Available. FINDINGS: There is no evidence of fracture, dislocation, or joint effusion. Small osseous fragment about the inferior aspect of the lateral malleolus, likely a chronic process. Osteophytes about the talonavicular joint suggesting arthropathy. Prominent plantar calcaneal spurring. Marked soft tissue swelling about lateral  malleolus. IMPRESSION: Marked soft tissue swelling about the lateral malleolus without evidence of acute fracture or dislocation. Degenerative changes of the talonavicular joint. Electronically Signed   By: Larose Hires D.O.   On: 09/09/2022 22:21   DG Ankle Complete Left  Result Date: 09/09/2022 CLINICAL DATA:  Left ankle pain/deformity. EXAM: LEFT ANKLE COMPLETE - 3+ VIEW COMPARISON:  None Available. FINDINGS: There is oblique mildly displaced fracture of the distal tibia. No other appreciable fracture or dislocation. Osteophyte  about the anterior aspect of the talus concerning for anterior ankle impingement. Marked soft tissue swelling about anteromedial aspect of the distal leg. IMPRESSION: 1. Oblique mildly displaced fracture of the distal tibia. 2. Osteophyte about the anterior aspect of the talus concerning for anterior ankle impingement. 3. Marked soft tissue swelling about the anteromedial aspect of the distal leg. Electronically Signed   By: Larose Hires D.O.   On: 09/09/2022 22:17        Scheduled Meds:  atorvastatin  40 mg Oral Daily   levothyroxine  112 mcg Oral Q0600   lumateperone tosylate  42 mg Oral Daily   oxcarbazepine  1,200 mg Oral BID   oxyCODONE-acetaminophen  1 tablet Oral TID   pantoprazole  40 mg Oral Daily   PARoxetine  40 mg Oral Daily   prazosin  2 mg Oral QHS   propranolol  20 mg Oral BID   QUEtiapine  300 mg Oral QHS   ranolazine  1,000 mg Oral BID   tiZANidine  4 mg Oral TID   trazodone  300 mg Oral QHS   Continuous Infusions:  sodium chloride 75 mL/hr at 09/10/22 0341     LOS: 0 days    Time spent: 35 minutes    Delfino Lovett, MD [facility] Pager [phone]  If 7PM-7AM, please contact night-coverage [email or web address] Password [facility] 09/10/2022, 3:58 PM

## 2022-09-10 NOTE — ED Provider Notes (Signed)
[facility] Provider Note    Event Date/Time   First MD Initiated Contact with Patient 09/09/22 2302     (approximate)  History   Chief Complaint: Ankle Pain and Fall  HPI  Diana Quinn is a 57 y.o. female with a past medical history of anemia, CAD, MI, seizure disorder, osteoporosis, presents to the emergency department for bilateral ankle pain.  According to the patient she was walking when she missed stepped and rolled both of her ankles outwards.  Patient states since the fall she has not been able to stand up or bear weight on either ankle.  States significant pain to both ankles.  Denies any other injuries.  Did not hit her head.  No LOC.  Physical Exam   Triage Vital Signs: ED Triage Vitals [09/09/22 2148]  Enc Vitals Group     BP 106/60     Pulse Rate 64     Resp 19     Temp 97.7 F (36.5 C)     Temp Source Oral     SpO2 99 %     Weight 182 lb (82.6 kg)     Height      Head Circumference      Peak Flow      Pain Score 8     Pain Loc      Pain Edu?      Excl. in GC?     Most recent vital signs: Vitals:   09/09/22 2148 09/09/22 2300  BP: 106/60 102/80  Pulse: 64 (!) 56  Resp: 19 16  Temp: 97.7 F (36.5 C)   SpO2: 99% 100%    General: Awake, no distress.  CV:  Good peripheral perfusion.  Regular rate and rhythm  Resp:  Normal effort.  Equal breath sounds bilaterally.  Abd:  No distention.  Soft, nontender.  No rebound or guarding. Other:  Patient has moderate swelling to bilateral ankles with tenderness to the lateral malleolus of the right ankle as well as lateral and medial malleolus of the left ankle.  Both feet are neurovascular intact with 2+ DP pulses.   ED Results / Procedures / Treatments   EKG  EKG viewed and interpreted by myself shows a sinus rhythm at 69 bpm with a narrow QRS, normal axis, normal intervals, no concerning ST changes.  Occasional PVC.  RADIOLOGY  I have reviewed and interpreted the left ankle  x-ray.  Patient appears to have a fracture through the distal tibia. Radiology confirms oblique mildly displaced fracture through the distal tibia. Right ankle x-ray shows swelling but no obvious fracture. CT scan of the right ankle shows nondisplaced fracture through the medial malleolus and distal lateral calcaneus, probable avulsed fragments of the lateral malleolus and lateral talus CT scan of the left ankle shows oblique fracture through the distal tibia and minimally displaced fracture through the anterior process of the calcaneus  MEDICATIONS ORDERED IN ED: Medications  fentaNYL (SUBLIMAZE) injection 50 mcg (50 mcg Intravenous Given 09/09/22 2304)  morphine (PF) 4 MG/ML injection 4 mg (4 mg Intravenous Given 09/09/22 2335)  ondansetron (ZOFRAN) injection 4 mg (4 mg Intravenous Given 09/09/22 2335)     IMPRESSION / MDM / ASSESSMENT AND PLAN / ED COURSE  I reviewed the triage vital signs and the nursing notes.  Patient's presentation is most consistent with acute presentation with potential threat to life or bodily function.  Patient presents emergency department after mechanical fall which both of her ankles rolled.  Patient  has swelling and pain to both ankles.  X-ray shows left ankle/distal tibia fracture, right ankle x-rays negative besides swelling.  However given the degree of tenderness to the right ankle we will proceed with CT scans of both ankles.  CT confirms nondisplaced fracture through the right medial malleolus and distal lateral calcaneus.  Left ankle shows distal tibial fracture possible calcaneal fracture.  Patient is unable to obviously bear weight with either ankle.  Unable to use crutches or a walker.  We will admit to the hospital service for continued pain management as well as to help with safe disposition.  I will discuss with orthopedics.  Like a long-leg left splintI spoke with Dr. Allena Katz who.  I spoke to Dr. Logan Bores of podiatry who would like a splinted right lower  extremity.  Will admit to the hospital service as both of these fractures will be nonweightbearing and the patient is unable to get around.  We will admit for continued pain management.  FINAL CLINICAL IMPRESSION(S) / ED DIAGNOSES   Bilateral ankle fractures    Note:  This document was prepared using Dragon voice recognition software and may include unintentional dictation errors.   Minna Antis, MD 09/10/22 [phone]

## 2022-09-10 NOTE — Evaluation (Signed)
Occupational Therapy Evaluation Patient Details Name: Diana Quinn MRN: [id number] DOB: Oct 01, 1965 Today's Date: 09/10/2022   History of Present Illness Pt is a 57 y/o F admitted on 09/09/22 after presenting with c/o fall, rolling both ankles inward & hearing a pop. Pt found to have L distal tibial fx nondisplaced & R nondisplaced ankle fx. Podiatry currently recommending conservative care & NWB BLE x 4-6 weeks. PMH: PTSD, bipolar schizophrenia, anxiety, osteoporosis, DM2, CAD s/p MI with non obstructive CAD on cath, seizure disorder, TIA, DJD   Clinical Impression   Diana Quinn was seen for OT evaluation this date. Prior to hospital admission, pt was IND, endorses hx of falls. Pt lives with son, daughter in law, and young grandchildren. Pt lives in house with 3 STE and narrow hallways. Pt requires MOD A exit bed, limited by BLE pain (premedicated for session and RN notified 8/10 pain end of session). Fair sitting balance/tolerance. Educated on anterior/posterior and lateral scoot t/f to maintain BLE NWBing precautions. MAX A lateral scooting along bed, improves to MIN A for posterior scooting. Pt would benefit from skilled OT to address noted impairments and functional limitations (see below for any additional details). Upon hospital discharge, recommend follow up therapy.     Recommendations for follow up therapy are one component of a multi-disciplinary discharge planning process, led by the attending physician.  Recommendations may be updated based on patient status, additional functional criteria and insurance authorization.   Assistance Recommended at Discharge Frequent or constant Supervision/Assistance  Patient can return home with the following Help with stairs or ramp for entrance;Two people to help with walking and/or transfers;Two people to help with bathing/dressing/bathroom    Functional Status Assessment  Patient has had a recent decline in their functional status and demonstrates the  ability to make significant improvements in function in a reasonable and predictable amount of time.  Equipment Recommendations  Hospital bed    Recommendations for Other Services       Precautions / Restrictions Precautions Precautions: Fall Restrictions Weight Bearing Restrictions: Yes RLE Weight Bearing: Non weight bearing LLE Weight Bearing: Non weight bearing      Mobility Bed Mobility Overal bed mobility: Needs Assistance Bed Mobility: Supine to Sit, Sit to Supine     Supine to sit: Min assist, +2 for physical assistance, HOB elevated Sit to supine: Mod assist   General bed mobility comments: assist for BLE    Transfers Overall transfer level: Needs assistance   Transfers: Bed to chair/wheelchair/BSC            Lateral/Scoot Transfers: Max assist General transfer comment: along EOB. MIN A posterior scooting with cues      Balance Overall balance assessment: Needs assistance, History of Falls Sitting-balance support: Feet supported, Feet unsupported, Bilateral upper extremity supported Sitting balance-Leahy Scale: Fair                                     ADL either performed or assessed with clinical judgement   ADL Overall ADL's : Needs assistance/impaired                                       General ADL Comments: MAX A for LB access seated EOB. SUPERVISION seated grooming tasks. Anticipate MAX A bed level toileting      Pertinent Vitals/Pain Pain Assessment  Pain Assessment: 0-10 Pain Score: 8  Pain Location: BLE (L>R) Pain Descriptors / Indicators: Grimacing, Guarding, Discomfort Pain Intervention(s): Premedicated before session, Patient requesting pain meds-RN notified     Hand Dominance     Extremity/Trunk Assessment Upper Extremity Assessment Upper Extremity Assessment: Generalized weakness   Lower Extremity Assessment Lower Extremity Assessment: Generalized weakness;LLE deficits/detail RLE Deficits /  Details: Pt with R ankle splint LLE Deficits / Details: Pt with LLE splint rising above posterior knee (requested MD adjust/shorten splint if appropriate). Pt notes L foot is still numb.       Communication Communication Communication: No difficulties   Cognition Arousal/Alertness: Awake/alert Behavior During Therapy: WFL for tasks assessed/performed Overall Cognitive Status: Within Functional Limits for tasks assessed                                                  Home Living Family/patient expects to be discharged to:: Private residence Living Arrangements: Children Available Help at Discharge: Family;Available 24 hours/day Type of Home: House Home Access: Stairs to enter [employer] of Steps: 3 in back, 6 in front   Home Layout: One level     Bathroom Shower/Tub: Tub/shower unit         Home Equipment: Agricultural consultant (2 wheels) (but notes RW is too short for her)   Additional Comments: Pt recently moved in with her son, daughter-in-law, & their 3 children. Daughter-in-law home throughout the day, son works. Pt was sleeping on air mattress.      Prior Functioning/Environment Prior Level of Function : History of Falls (last six months);Independent/Modified Independent             Mobility Comments: Pt ambulatory without AD, notes 4-5 falls in the past 6 months.          OT Problem List: Decreased strength;Decreased range of motion;Decreased activity tolerance;Decreased safety awareness;Pain      OT Treatment/Interventions: Self-care/ADL training;Therapeutic exercise;Energy conservation;DME and/or AE instruction;Therapeutic activities;Patient/family education;Balance training    OT Goals(Current goals can be found in the care plan section) Acute Rehab OT Goals Patient Stated Goal: to go to rehab OT Goal Formulation: With patient Time For Goal Achievement: 09/24/22 Potential to Achieve Goals: Good ADL Goals Pt Will Perform  Lower Body Dressing: with min assist;sitting/lateral leans Pt Will Transfer to Toilet: with min assist;with transfer board;anterior/posterior transfer;bedside commode Pt Will Perform Toileting - Clothing Manipulation and hygiene: with modified independence;sitting/lateral leans  OT Frequency: Min 2X/week    Co-evaluation PT/OT/SLP Co-Evaluation/Treatment: Yes Reason for Co-Treatment: For patient/therapist safety;Complexity of the patient's impairments (multi-system involvement);To address functional/ADL transfers PT goals addressed during session: Mobility/safety with mobility;Balance;Strengthening/ROM OT goals addressed during session: ADL's and self-care      AM-PAC OT "6 Clicks" Daily Activity     Outcome Measure Help from another person eating meals?: None Help from another person taking care of personal grooming?: None Help from another person toileting, which includes using toliet, bedpan, or urinal?: A Lot Help from another person bathing (including washing, rinsing, drying)?: A Lot Help from another person to put on and taking off regular upper body clothing?: A Little Help from another person to put on and taking off regular lower body clothing?: A Lot 6 Click Score: 17   End of Session Nurse Communication: Mobility status;Patient requests pain meds  Activity Tolerance: Patient limited by pain Patient left: in  bed;with call bell/phone within reach;with bed alarm set  OT Visit Diagnosis: Other abnormalities of gait and mobility (R26.89);Muscle weakness (generalized) (M62.81)                Time: 1610-9604 OT Time Calculation (min): 22 min Charges:  OT General Charges $OT Visit: 1 Visit OT Evaluation $OT Eval Moderate Complexity: 1 Mod  Kathie Dike, M.S. OTR/L  09/10/22, 1:51 PM  ascom [phone]

## 2022-09-10 NOTE — H&P (Addendum)
History and Physical    Diana Quinn XNA:[id number] DOB: 02-Mar-1966 DOA: 09/09/2022  PCP: Diana Eva, NP  Patient coming from:home  I have personally briefly reviewed patient's old medical records in [facility]  Chief Complaint:    HPI: Diana Quinn is a 57 y.o. female with medical history significant of  PTSD/Bipolar/Schizophrenia, anxiety,Osteoporosis, DMII with history of hypogylcemia, CAD s/p MI with non-obstructive CAD on Cath per patient, Seizure d/o djd for the back , TIA , hx of gastric bypass for treatment of obesity. Patient noted history of   B/l ankle laxity with hx of right ankle fracture who presents to ED s/p mechanical fall described as her ankles giving out. Patient noted no other injury, and noted she did not hit her head. Patient denies feeling on well otherwise, no n/v/d/dysuria/ sob/chest/or abdominal  pain. She does endorse chronic back pain for which she follow with pain management and pcp.    ED Course:  Vitals:  Afeb, bp 106/60, hr 64, rr 19 sat 99%  ON evaluation in ED patient was found to have b/l ankle fracture  Patient discussed with podiatry and orthopedics, ortho has deferred to podiatry(Dr Logan Bores) who will she patient in am for possible intervention.   CT left ankle IMPRESSION: Oblique fracture through the distal tibial metaphysis.   Minimally displaced fracture through the anterior process of the calcaneus.  CT right ankle  IMPRESSION: Nondisplaced fracture through the medial malleolus and distal lateral calcaneus.   Probable avulsed fragments off the lateral malleolus and/or lateral talus.   Widening of the lateral ankle mortise.   EKG: Sinus rhythm  PVC  Labs Wbc 8.4, hgb 11.7, plt380  Na 135,K 4.1, cl 105, cr 0.83 Tx fentanyl, dilaudid,zofran, morphine  Review of Systems: As per HPI otherwise 10 point review of systems negative.   Past Medical History:  Diagnosis Date   Anemia    Elevated lithium level 05/10/2014    Gastric bypass status for obesity    H/O unstable angina    Hx SBO 09/30/2014   Hypoglycemia    MI (myocardial infarction) (HCC)    Orthostatic hypotension    Osteoporosis    Sciatic nerve disease    Seizures (HCC)    Thyroid disease    TIA (transient ischemic attack) 04/22/2017    Past Surgical History:  Procedure Laterality Date   ABDOMINAL ADHESION SURGERY     ABDOMINAL HYSTERECTOMY     CHOLECYSTECTOMY     DILATION AND CURETTAGE OF UTERUS     ESOPHAGOGASTRODUODENOSCOPY     GASTRIC BYPASS     GASTRIC BYPASS OPEN     revision   GASTROSTOMY W/ FEEDING TUBE     HERNIA REPAIR     OOPHORECTOMY     SPLENECTOMY     TONSILLECTOMY       reports that she has never smoked. She has never used smokeless tobacco. She reports that she does not currently use alcohol. She reports that she does not use drugs.  Allergies  Allergen Reactions   Depakote [Divalproex Sodium] Shortness Of Breath   Pregabalin Other (See Comments), Rash and Shortness Of Breath    Other Reaction: falls; decreased coordination  Pt ts that she passes out when she takes this med.   Other Reaction: falls; decreased coordination    Pt ts that she passes out when she takes this med.  Other Reaction: falls; decreased coordination, , Pt ts that she passes out when she takes this med.   Valproic Acid Other (  See Comments) and Shortness Of Breath    Other Reaction: Other reaction  SOB   Levetiracetam Itching and Rash    Family History  Problem Relation Age of Onset   Cancer Mother    Hypertension Mother    COPD Mother    Asthma Sister    Psoriasis Sister    Epilepsy Brother     Prior to Admission medications   Medication Sig Start Date End Date Taking? Authorizing Provider  BELSOMRA 20 MG TABS Take 1 tablet by mouth at bedtime. 08/07/22  Yes [provider]  CAPLYTA 42 MG capsule Take 42 mg by mouth daily.   Yes [provider]  clonazePAM (KLONOPIN) 1 MG tablet Take 1 mg by mouth daily  as needed for anxiety.   Yes [provider]  cyanocobalamin (VITAMIN B12) 1000 MCG/ML injection Inject 1 mL (1,000 mcg total) into the muscle every 30 (thirty) days. 08/25/22  Yes Miki Kins, FNP  diclofenac Sodium (VOLTAREN) 1 % GEL Apply 2 g topically 4 (four) times daily as needed. 12/25/21  Yes Long, Arlyss Repress, MD  ergocalciferol (VITAMIN D2) 1.25 MG (50000 UT) capsule Take 1.25 mcg by mouth once a week.  02/08/18  Yes [provider]  GVOKE HYPOPEN 2-PACK 1 MG/0.2ML SOAJ Inject 1 mg into the skin as needed (hypoglycemia).   Yes [provider]  levothyroxine (SYNTHROID) 112 MCG tablet Take 112 mcg by mouth daily before breakfast. 04/08/22  Yes [provider]  naloxone (NARCAN) nasal spray 4 mg/0.1 mL as needed. As needed 07/16/22  Yes [provider]  oxcarbazepine (TRILEPTAL) 600 MG tablet Take 1,200 mg by mouth 2 (two) times daily. 06/20/22  Yes [provider]  oxyCODONE-acetaminophen (PERCOCET) 7.5-325 MG tablet Take 1 tablet by mouth 3 (three) times daily. 08/25/22  Yes Miki Kins, FNP  pantoprazole (PROTONIX) 40 MG tablet Take 40 mg by mouth daily.   Yes [provider]  PARoxetine (PAXIL) 40 MG tablet Take 40 mg by mouth daily. 07/16/22  Yes [provider]  prazosin (MINIPRESS) 2 MG capsule Take 2-4 mg by mouth at bedtime.   Yes [provider]  propranolol (INDERAL) 20 MG tablet Take 20 mg by mouth 2 (two) times daily as needed. 08/03/22  Yes [provider]  QUEtiapine (SEROQUEL) 100 MG tablet Take 300 mg by mouth at bedtime. 08/03/22  Yes [provider]  ranolazine (RANEXA) 1000 MG SR tablet Take 1,000 mg by mouth 2 (two) times daily. 01/21/20  Yes [provider]  tiZANidine (ZANAFLEX) 4 MG tablet Take 1 tablet (4 mg total) by mouth 3 (three) times daily. 08/25/22 02/21/23 Yes Miki Kins, FNP  trazodone (DESYREL) 300 MG tablet Take 300 mg by mouth at bedtime. 08/07/22   Yes [provider]  acarbose (PRECOSE) 100 MG tablet Take 100 mg by mouth 3 (three) times daily with meals. Patient not taking: Reported on 09/09/2022 03/31/22   [provider]  atorvastatin (LIPITOR) 40 MG tablet Take 1 tablet (40 mg total) by mouth daily. Patient not taking: Reported on 09/09/2022 08/25/22   Miki Kins, FNP    Physical Exam: Vitals:   09/09/22 2300 09/09/22 2330 09/10/22 0030 09/10/22 0100  BP: 102/80 119/60 127/68 (!) 114/59  Pulse: (!) 56 (!) 59 (!) 53 (!) 54  Resp: 16 17 17 15   Temp:      TempSrc:      SpO2: 100% 100% 100% 100%  Weight:  Constitutional: NAD, calm, comfortable Vitals:   09/09/22 2300 09/09/22 2330 09/10/22 0030 09/10/22 0100  BP: 102/80 119/60 127/68 (!) 114/59  Pulse: (!) 56 (!) 59 (!) 53 (!) 54  Resp: 16 17 17 15   Temp:      TempSrc:      SpO2: 100% 100% 100% 100%  Weight:       Eyes: PERRL, lids and conjunctivae normal ENMT: Mucous membranes are moist. Posterior pharynx clear of any exudate or lesions.Normal dentition.  Neck: normal, supple, no masses, no thyromegaly Respiratory: clear to auscultation bilaterally, no wheezing, no crackles. Normal respiratory effort. No accessory muscle use.  Cardiovascular: Regular rate and rhythm, no murmurs / rubs / gallops. No extremity edema. 2+ pedal pulses. No carotid bruits.  Abdomen: no tenderness, no masses palpated. No hepatosplenomegaly. Bowel sounds positive.  Musculoskeletal: no clubbing / cyanosis. Bruising noted b/l ankle decrease rom and tender to palpation, sensation intact ,Good  Normal muscle tone.  Skin: no rashes, lesions, ulcers. No induration Neurologic: CN 2-12 grossly intact. Sensation intact, DTR normal. Strength 5/5 in all 4.  Psychiatric: Normal judgment and insight. Alert and oriented x 3. Normal mood.    Labs on Admission: I have personally reviewed following labs and imaging studies  CBC: Recent Labs  Lab 09/09/22 2335  WBC 8.4  HGB  11.7*  HCT 36.4  MCV 101.1*  PLT 380   Basic Metabolic Panel: Recent Labs  Lab 09/09/22 2335  NA 135  K 4.1  CL 105  CO2 24  GLUCOSE 90  BUN 17  CREATININE 0.83  CALCIUM 8.2*   GFR: Estimated Creatinine Clearance: 78.9 mL/min (by C-G formula based on SCr of 0.83 mg/dL). Liver Function Tests: No results for input(s): "AST", "ALT", "ALKPHOS", "BILITOT", "PROT", "ALBUMIN" in the last 168 hours. No results for input(s): "LIPASE", "AMYLASE" in the last 168 hours. No results for input(s): "AMMONIA" in the last 168 hours. Coagulation Profile: No results for input(s): "INR", "PROTIME" in the last 168 hours. Cardiac Enzymes: No results for input(s): "CKTOTAL", "CKMB", "CKMBINDEX", "TROPONINI" in the last 168 hours. BNP (last 3 results) No results for input(s): "PROBNP" in the last 8760 hours. HbA1C: No results for input(s): "HGBA1C" in the last 72 hours. CBG: No results for input(s): "GLUCAP" in the last 168 hours. Lipid Profile: No results for input(s): "CHOL", "HDL", "LDLCALC", "TRIG", "CHOLHDL", "LDLDIRECT" in the last 72 hours. Thyroid Function Tests: No results for input(s): "TSH", "T4TOTAL", "FREET4", "T3FREE", "THYROIDAB" in the last 72 hours. Anemia Panel: No results for input(s): "VITAMINB12", "FOLATE", "FERRITIN", "TIBC", "IRON", "RETICCTPCT" in the last 72 hours. Urine analysis:    Component Value Date/Time   COLORURINE AMBER (A) 12/25/2021 1332   APPEARANCEUR HAZY (A) 12/25/2021 1332   APPEARANCEUR Clear 09/30/2011 1945   LABSPEC >=1.030 12/25/2021 1332   LABSPEC 1.018 09/30/2011 1945   PHURINE 6.0 12/25/2021 1332   GLUCOSEU NEGATIVE 12/25/2021 1332   GLUCOSEU Negative 09/30/2011 1945   HGBUR NEGATIVE 12/25/2021 1332   BILIRUBINUR SMALL (A) 12/25/2021 1332   BILIRUBINUR Negative 09/30/2011 1945   KETONESUR NEGATIVE 12/25/2021 1332   PROTEINUR NEGATIVE 12/25/2021 1332   NITRITE POSITIVE (A) 12/25/2021 1332   LEUKOCYTESUR TRACE (A) 12/25/2021 1332    LEUKOCYTESUR Negative 09/30/2011 1945    Radiological Exams on Admission: CT Ankle Left Wo Contrast  Result Date: 09/10/2022 CLINICAL DATA:  Ankle trauma, fracture, xray done (Age >= 5y) EXAM: CT OF THE LEFT ANKLE WITHOUT CONTRAST TECHNIQUE: Multidetector CT imaging of the left ankle was performed according  to the standard protocol. Multiplanar CT image reconstructions were also generated. RADIATION DOSE REDUCTION: This exam was performed according to the departmental dose-optimization program which includes automated exposure control, adjustment of the mA and/or kV according to patient size and/or use of iterative reconstruction technique. COMPARISON:  Plain films today FINDINGS: Bones/Joint/Cartilage There is an oblique fracture through the distal tibial metaphysis. No visible fibular fracture. Ankle mortise appears intact. Fracture through the anterior process of the calcaneus. Well corticated bone fragments noted along the anterior surface of the distal talus. Ligaments Suboptimally assessed by CT. Muscles and Tendons Grossly unremarkable Soft tissues Soft tissue swelling in the lower calf and ankle region. IMPRESSION: Oblique fracture through the distal tibial metaphysis. Minimally displaced fracture through the anterior process of the calcaneus. Electronically Signed   By: Charlett Nose M.D.   On: 09/10/2022 00:15   CT Ankle Right Wo Contrast  Result Date: 09/10/2022 CLINICAL DATA:  Ankle pain, stress fracture suspected, neg xray. Fall. EXAM: CT OF THE RIGHT ANKLE WITHOUT CONTRAST TECHNIQUE: Multidetector CT imaging of the right ankle was performed according to the standard protocol. Multiplanar CT image reconstructions were also generated. RADIATION DOSE REDUCTION: This exam was performed according to the departmental dose-optimization program which includes automated exposure control, adjustment of the mA and/or kV according to patient size and/or use of iterative reconstruction technique. COMPARISON:   None Available. FINDINGS: Bones/Joint/Cartilage There is an oblique nondisplaced fracture through the right medial malleolus. Multiple small bone fragments adjacent to the tip of the lateral malleolus and lateral talus, many of which appear well corticated and chronic, but others are likely acute. Widening of the lateral ankle mortise. Nondisplaced fracture noted through the distal calcaneus laterally near the calcaneocuboid joint. Ligaments Suboptimally assessed by CT. Muscles and Tendons Grossly unremarkable. Soft tissues Soft tissue swelling diffusely, most pronounced laterally. IMPRESSION: Nondisplaced fracture through the medial malleolus and distal lateral calcaneus. Probable avulsed fragments off the lateral malleolus and/or lateral talus. Widening of the lateral ankle mortise. Electronically Signed   By: Charlett Nose M.D.   On: 09/10/2022 00:12   DG Ankle Complete Right  Result Date: 09/09/2022 CLINICAL DATA:  Mechanical fall.  Ankle pain and deformity. EXAM: RIGHT ANKLE - COMPLETE 3+ VIEW COMPARISON:  None Available. FINDINGS: There is no evidence of fracture, dislocation, or joint effusion. Small osseous fragment about the inferior aspect of the lateral malleolus, likely a chronic process. Osteophytes about the talonavicular joint suggesting arthropathy. Prominent plantar calcaneal spurring. Marked soft tissue swelling about lateral malleolus. IMPRESSION: Marked soft tissue swelling about the lateral malleolus without evidence of acute fracture or dislocation. Degenerative changes of the talonavicular joint. Electronically Signed   By: Larose Hires D.O.   On: 09/09/2022 22:21   DG Ankle Complete Left  Result Date: 09/09/2022 CLINICAL DATA:  Left ankle pain/deformity. EXAM: LEFT ANKLE COMPLETE - 3+ VIEW COMPARISON:  None Available. FINDINGS: There is oblique mildly displaced fracture of the distal tibia. No other appreciable fracture or dislocation. Osteophyte about the anterior aspect of the talus  concerning for anterior ankle impingement. Marked soft tissue swelling about anteromedial aspect of the distal leg. IMPRESSION: 1. Oblique mildly displaced fracture of the distal tibia. 2. Osteophyte about the anterior aspect of the talus concerning for anterior ankle impingement. 3. Marked soft tissue swelling about the anteromedial aspect of the distal leg. Electronically Signed   By: Larose Hires D.O.   On: 09/09/2022 22:17    EKG: Independently reviewed. See above Assessment/Plan    B/L ankle  fracture  - admit to med tele  - npo midnight for possible intervention  - podiatry Dr Logan Bores to leave final recs  - supportive care with pain medications   PTSD/Bipolar/Anxiety  -resume home regimen base on med rec   Osteoporosis  - on fosamax  - check vit D  DMII - hx of hypo/hyperglycemic episodes -monitor on finger sticks q4h while npo  -hold oral outpt med  currently resume as able - hold iss for now -in setting of npo and hx of hypoglycemic episodes - A1c   CAD s/p MI  -CAD non-obstructive but per patient cath noted small caliber vessels  -continue ranxena  DJD of the back  -chronic back pain  -follows with pcp and pain management   Hypothyroidism -resume synthroid  TIA hx   Obesity  -s/p gastric bypass   Hx if Seizure d/o  -on trileptal    DVT prophylaxis: scd Code Status: full/ as discussed per patient wishes in event of cardiac arrest  Family Communication: at bedside nicholson,david (Son) [phone] (Mobile)  Disposition Plan: patient  expected to be admitted greater than 2 midnights ) Consults called: podiatry DR Logan Bores Admission status: med tele   Lurline Del MD [facility]   If 7PM-7AM, please contact night-coverage [email or web address] Password [email or web address]  09/10/2022, 1:49 AM

## 2022-09-10 NOTE — Evaluation (Signed)
Physical Therapy Evaluation Patient Details Name: Diana Quinn MRN: [id number] DOB: 1965/04/28 Today's Date: 09/10/2022  History of Present Illness  Pt is a 57 y/o F admitted on 09/09/22 after presenting with c/o fall, rolling both ankles inward & hearing a pop. Pt found to have L distal tibial fx nondisplaced & R nondisplaced ankle fx. Podiatry currently recommending conservative care & NWB BLE x 4-6 weeks. PMH: PTSD, bipolar schizophrenia, anxiety, osteoporosis, DM2, CAD s/p MI with non obstructive CAD on cath, seizure disorder, TIA, DJD  Clinical Impression  Pt seen for PT evaluation with co-tx with OT for pt & therapists' safety. Pt reports prior to admission she was independent without AD but endorses she's "always falling". PT educated pt on NWB BLE with pt verbalizing understanding. Pt engages in bed mobility & scooting laterally and posteriorly with cuing for technique/increased ease & assistance. Pt is limited by BLE pain (L>R) that increases with movement. Pt would benefit from ongoing PT services to address strengthening, balance, bed mobility & transfers. Educated pt on use of w/c for mobility until allowed to weight bear on BLE with pt noting house set up is very small & unsure a w/c will fit in the home. Also discussed possibility of installing/renting ramp for home access or 2 people assisting pt with bumping up steps in w/c.        Assistance Recommended at Discharge Intermittent Supervision/Assistance  If plan is discharge home, recommend the following:  Can travel by private vehicle  A lot of help with walking and/or transfers;A lot of help with bathing/dressing/bathroom;Assist for transportation;Assistance with cooking/housework;Help with stairs or ramp for entrance   No    Equipment Recommendations None recommended by PT (TBD in next venue)  Recommendations for Other Services       Functional Status Assessment Patient has had a recent decline in their functional status and  demonstrates the ability to make significant improvements in function in a reasonable and predictable amount of time.     Precautions / Restrictions Precautions Precautions: Fall Restrictions Weight Bearing Restrictions: Yes RLE Weight Bearing: Non weight bearing LLE Weight Bearing: Non weight bearing      Mobility  Bed Mobility Overal bed mobility: Needs Assistance Bed Mobility: Supine to Sit, Sit to Supine     Supine to sit: Min assist, +2 for physical assistance, HOB elevated Sit to supine: Mod assist (assistance to move BLE onto bed)        Transfers                        Ambulation/Gait                  Stairs            Wheelchair Mobility     Tilt Bed    Modified Rankin (Stroke Patients Only)       Balance Overall balance assessment: Needs assistance, History of Falls Sitting-balance support: Feet supported, Feet unsupported, Bilateral upper extremity supported Sitting balance-Leahy Scale: Fair                                       Pertinent Vitals/Pain Pain Assessment Pain Assessment: Faces Faces Pain Scale: Hurts whole lot Pain Location: BLE (L>R) Pain Descriptors / Indicators: Grimacing, Guarding, Discomfort Pain Intervention(s): Monitored during session, Limited activity within patient's tolerance, Repositioned    Home Living Family/patient expects to  be discharged to:: Private residence Living Arrangements: Children Available Help at Discharge: Family;Available 24 hours/day Type of Home: House Home Access: Stairs to enter   [employer] of Steps: 3 in back, 6 in front   Home Layout: One level Home Equipment: Agricultural consultant (2 wheels) (but notes RW is too short for her) Additional Comments: Pt recently moved in with her son, daughter-in-law, & their 3 children. Daughter-in-law home throughout the day, son works. Pt was sleeping on air mattress.    Prior Function Prior Level of Function :  History of Falls (last six months);Independent/Modified Independent             Mobility Comments: Pt ambulatory without AD, notes 4-5 falls in the past 6 months.       Hand Dominance        Extremity/Trunk Assessment   Upper Extremity Assessment Upper Extremity Assessment: Generalized weakness    Lower Extremity Assessment Lower Extremity Assessment: Generalized weakness;LLE deficits/detail RLE Deficits / Details: Pt with R ankle splint LLE Deficits / Details: Pt with LLE splint rising above posterior knee (requested MD adjust/shorten splint if appropriate). Pt notes L foot is still numb.       Communication   Communication: No difficulties  Cognition Arousal/Alertness: Awake/alert Behavior During Therapy: WFL for tasks assessed/performed Overall Cognitive Status: Within Functional Limits for tasks assessed                                          General Comments      Exercises Other Exercises Other Exercises: Pt engages in scooting L along EOB requiring Max assist +2. Pt also scoots posteriorly with PT educating her on head/hips relationship.   Assessment/Plan    PT Assessment Patient needs continued PT services  PT Problem List Decreased strength;Cardiopulmonary status limiting activity;Pain;Decreased range of motion;Decreased activity tolerance;Decreased balance;Decreased mobility;Decreased safety awareness;Decreased knowledge of use of DME;Impaired sensation       PT Treatment Interventions DME instruction;Therapeutic exercise;Wheelchair mobility training;Balance training;Neuromuscular re-education;Functional mobility training;Therapeutic activities;Patient/family education;Modalities;Manual techniques    PT Goals (Current goals can be found in the Care Plan section)  Acute Rehab PT Goals Patient Stated Goal: decreased pain, get better PT Goal Formulation: With patient Time For Goal Achievement: 09/24/22 Potential to Achieve Goals:  Good Additional Goals Additional Goal #1: Pt will propel w/c x 150 ft with mod I to increase independence with mobility.    Frequency 7X/week     Co-evaluation PT/OT/SLP Co-Evaluation/Treatment: Yes Reason for Co-Treatment: For patient/therapist safety;Complexity of the patient's impairments (multi-system involvement);To address functional/ADL transfers PT goals addressed during session: Mobility/safety with mobility;Balance;Strengthening/ROM         AM-PAC PT "6 Clicks" Mobility  Outcome Measure Help needed turning from your back to your side while in a flat bed without using bedrails?: A Little Help needed moving from lying on your back to sitting on the side of a flat bed without using bedrails?: A Lot Help needed moving to and from a bed to a chair (including a wheelchair)?: Total Help needed standing up from a chair using your arms (e.g., wheelchair or bedside chair)?: Total Help needed to walk in hospital room?: Total Help needed climbing 3-5 steps with a railing? : Total 6 Click Score: 9    End of Session   Activity Tolerance: Patient limited by pain Patient left: in bed;with call bell/phone within reach;with bed alarm set Nurse  Communication: Mobility status PT Visit Diagnosis: Muscle weakness (generalized) (M62.81);History of falling (Z91.81);Other abnormalities of gait and mobility (R26.89);Pain Pain - Right/Left:  (bilateral) Pain - part of body: Ankle and joints of foot    Time: 9562-1308 PT Time Calculation (min) (ACUTE ONLY): 26 min   Charges:   PT Evaluation $PT Eval Moderate Complexity: 1 Mod   PT General Charges $$ ACUTE PT VISIT: 1 Visit         Aleda Grana, PT, DPT 09/10/22, 11:41 AM   Sandi Mariscal 09/10/2022, 11:39 AM

## 2022-09-11 ENCOUNTER — Encounter: Payer: Self-pay | Admitting: Oncology

## 2022-09-11 DIAGNOSIS — S82891A Other fracture of right lower leg, initial encounter for closed fracture: Secondary | ICD-10-CM | POA: Diagnosis not present

## 2022-09-11 DIAGNOSIS — Z87898 Personal history of other specified conditions: Secondary | ICD-10-CM | POA: Diagnosis not present

## 2022-09-11 DIAGNOSIS — F419 Anxiety disorder, unspecified: Secondary | ICD-10-CM

## 2022-09-11 DIAGNOSIS — E119 Type 2 diabetes mellitus without complications: Secondary | ICD-10-CM | POA: Diagnosis not present

## 2022-09-11 DIAGNOSIS — I950 Idiopathic hypotension: Secondary | ICD-10-CM | POA: Diagnosis not present

## 2022-09-11 DIAGNOSIS — G9341 Metabolic encephalopathy: Secondary | ICD-10-CM

## 2022-09-11 LAB — GLUCOSE, CAPILLARY
Glucose-Capillary: 113 mg/dL — ABNORMAL HIGH (ref 70–99)
Glucose-Capillary: 182 mg/dL — ABNORMAL HIGH (ref 70–99)
Glucose-Capillary: 135 mg/dL — ABNORMAL HIGH (ref 70–99)
Glucose-Capillary: 128 mg/dL — ABNORMAL HIGH (ref 70–99)

## 2022-09-11 MED ORDER — SODIUM CHLORIDE 0.9 % IV SOLN: INTRAVENOUS | Status: AC

## 2022-09-11 MED ORDER — MIDODRINE HCL 5 MG PO TABS
10.0000 mg | ORAL_TABLET | Freq: Three times a day (TID) | ORAL | Status: DC
  Administered 2022-09-11 – 2022-09-12 (×5): 10 mg via ORAL
  Filled 2022-09-11 (×5): qty 2

## 2022-09-11 NOTE — Progress Notes (Signed)
Physical Therapy Treatment Patient Details Name: Diana Quinn MRN: [id number] DOB: 01/06/1966 Today's Date: 09/11/2022   History of Present Illness Pt is a 57 y/o F admitted on 09/09/22 after presenting with c/o fall, rolling both ankles inward & hearing a pop. Pt found to have L distal tibial fx nondisplaced & R nondisplaced ankle fx. Podiatry currently recommending conservative care & NWB BLE x 4-6 weeks. 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  Pt with general confusion and need for repeated cuing/explanation/reorientation t/o the session.  Pt's BP has remained low but she was not lightheaded with EOB activities.  Pt had trouble doing real hip clearance scoots w/o also trying to put weight through LEs, she did manage some lower amplitude shifts R/L and fwb/back with bed elevated where she could not get feet to the floor, though still very functionally limited she did better with this today.  Attempted a slide board transfer to the recliner but pt could not attempt w/o too much weight through LEs, deferred.  Pt will benefit from continued PT to address functional limitations and maximize safety at discharge.  Recommendations continue to be appropriate, though pt very much considering going home with family and trying to make it work... will need DME if this is the case.     Assistance Recommended at Discharge Frequent or constant Supervision/Assistance  If plan is discharge home, recommend the following:  Can travel by private vehicle    A lot of help with walking and/or transfers;A lot of help with bathing/dressing/bathroom;Assist for  transportation;Assistance with cooking/housework;Help with stairs or ramp for entrance   No  Equipment Recommendations  Hospital bed;Wheelchair (measurements PT);BSC/3in1 (if going home (drop arm BSC), w/c with leg elevators and hospital bed with rails)    Recommendations for Other Services       Precautions / Restrictions Precautions Precautions: Fall Restrictions Weight Bearing Restrictions: Yes RLE Weight Bearing: Non weight bearing LLE Weight Bearing: Non weight bearing     Mobility  Bed Mobility Overal bed mobility: Needs Assistance Bed Mobility: Supine to Sit, Sit to Supine     Supine to sit: Min assist Sit to supine: Min assist   General bed mobility comments: assist for BLEs out of and back into bed    Transfers Overall transfer level: Needs assistance   Transfers: Bed to chair/wheelchair/BSC            Lateral/Scoot Transfers: Max assist, Min guard General transfer comment: scooting along EOB with continuous cuing for UE positioning/strategies, insuring LE NWBing was respected (with bed at low setting she consistently tried to put some weight through R>L LE to scoot).  Educated on/introduced a retro/T transfer, attempted a slide board transfer to recliner to the R but pt struggled to lift/scoot hips w/o also trying to put too much weight through LEs.  Pt did manage to do multiple rounds of R/L shifting and scooting backward/forward as well as some limited side scooting with bed elevated where she could not use LEs but fatigued quickly with the effort.    Ambulation/Gait               General Gait Details: b/l NWBing, not appropriate  Stairs             Wheelchair Mobility     Tilt Bed    Modified Rankin (Stroke Patients Only)       Balance Overall balance assessment: Needs assistance, History of Falls   Sitting balance-Leahy Scale: Fair Sitting balance - Comments: pt tending to lean backward, but able to maintain balance well with  UEs                                    Cognition Arousal/Alertness: Lethargic Behavior During Therapy: WFL for tasks assessed/performed Overall Cognitive Status: Within Functional Limits for tasks assessed                                          Exercises      General Comments General comments (skin integrity, edema, etc.): Pt continues with low BP (generally 80s/40s) with minimal symptoms/lightheadeness/etc      Pertinent Vitals/Pain Pain Assessment Pain Assessment: 0-10 Pain Score: 6  Pain Location: BLE (L>R)    Home Living                          Prior Function            PT Goals (current goals can now be found in the care plan section) Progress towards PT goals: Progressing toward goals    Frequency    7X/week      PT Plan Current plan remains appropriate    Co-evaluation              AM-PAC PT "6 Clicks" Mobility   Outcome Measure  Help needed turning from your back to your side while in a flat bed without using bedrails?: A Little Help needed moving from lying on your back to sitting on the side of a flat bed without using bedrails?: A Lot Help needed moving to and from a bed to a chair (including a wheelchair)?: Total Help needed standing up from a chair using your arms (e.g., wheelchair or bedside chair)?: Total Help needed to walk in hospital room?: Total Help needed climbing 3-5 steps with a railing? : Total 6 Click Score: 9    End of Session   Activity Tolerance: No increased pain;Patient limited by lethargy Patient left: in bed;with call bell/phone within reach;with bed alarm set Nurse Communication: Mobility status PT Visit Diagnosis: Muscle weakness (generalized) (M62.81);History of falling (Z91.81);Other abnormalities of gait and mobility (R26.89);Pain Pain - Right/Left: Left Pain - part of body: Ankle and joints of foot     Time: 1610-9604 PT Time Calculation (min) (ACUTE ONLY): 26  min  Charges:    $Therapeutic Activity: 23-37 mins PT General Charges $$ ACUTE PT VISIT: 1 Visit                     Malachi Pro, DPT 09/11/2022, 1:53 PM

## 2022-09-11 NOTE — Progress Notes (Addendum)
PROGRESS NOTE    Diana Quinn  NWG:[id number] DOB: October 11, 1965 DOA: 09/09/2022 PCP: Orson Eva, NP (Confirm with patient/family/NH records and if not entered, this HAS to be entered at [facility] point of entry. "No PCP" if truly none.)   Brief Narrative: (Start on day 1 of progress note - keep it brief and live) 57 y.o. female with medical history significant of  PTSD/Bipolar/Schizophrenia, anxiety,Osteoporosis, DMII with history of hypogylcemia, CAD s/p MI with non-obstructive CAD on Cath per patient, Seizure d/o djd for the back , TIA , hx of gastric bypass for treatment of obesity.  She is admitted for nondisplaced medial malleolus and distal lateral calcaneus fracture s/p mechanical fall  7/3: Podiatry recommends conservative management 7/4: Patient remains hypotensive but not symptomatic.  Still debating if she would consider rehab due to financial constraints 7/5: Signs and symptoms of acute drug intoxication/withdrawal this morning  Assessment & Plan:   Principal Problem:   Ankle fracture Active Problems:   Closed fracture of left distal tibia  Acute metabolic encephalopathy CT chest negative for PE, chest x-ray negative.  ABG within normal limit Patient was found to be confused, disoriented clammy and restless.  She was very tremulous and behavior concerning for drug intoxication/withdrawal Nursing found a bottle of Percocet, Zanaflex, Seroquel, Advil in her purse.  Unsure if she over ingested any of this or something else.  She did receive 1 dose of Compazine and Dilaudid.  Difficult to know if she reacted to this Recruitment consultant for now.  Low threshold in transferring to ICU/stepdown if need Requested nursing to call security for any suspicious behavior and turning all her medications  Left distal tibial fracture/nondisplaced and right nondisplaced ankle fracture -Appreciate podiatry input Conservative management plan.  Nonweightbearing bilaterally for 4 to 6 weeks followed by  weightbearing as tolerated in cam boot PT and OT evaluation recommends SNF.  TOC working on placement.  Patient is unsure if she would go to rehab as she learned that she will have to sign her disability check and she has financial constraint.   Patient was scheduled to confirm with her son but now unable to get in touch with him.  I have called him twice but no luck  PTSD/Bipolar/Anxiety  -Continue Paxil, Seroquel, trazodone, Caplyta Requested psychiatry evaluation  Hypotension Her systolic blood pressure has been running 100-120 at home as per patient.  Resolved now.  I will stop midodrine  Acute urinary retention For last 24 to 48 hours we have been trying in and out and PureWick which she continues to have trouble urinating.  Will request Foley catheter   Osteoporosis  - on fosamax   DMII -Sliding scale insulin for now - A1c 5.5   CAD s/p MI  -CAD non-obstructive but per patient cath noted small caliber vessels  -continue ranxea   DJD of the back  -chronic back pain  -follows with pcp and pain management    Hypothyroidism -Continue synthroid   TIA hx    Obesity  -s/p gastric bypass    Hx if Seizure d/o  -on trileptal   DVT prophylaxis: Lovenox enoxaparin (LOVENOX) injection 40 mg Start: 09/10/22 2200     Code Status: Full code Family Communication: None at bedside.  I tried calling son twice but had no luck Disposition Plan: Medically stable, SNF when bed available versus home with home health   Consultants:  Podiatry Psychiatry Palliative care  Subjective:  Had episode of diaphoresis, restlessness, confusion and disorientation.  Objective: Vitals:  09/11/22 0400 09/11/22 0730 09/11/22 1005 09/11/22 1121  BP: (!) 93/37 (!) 87/51 (!) 87/36 (!) 83/48  Pulse: (!) 52 (!) 54  (!) 59  Resp: 17 17  16   Temp: 98.2 F (36.8 C) 98 F (36.7 C)  98.7 F (37.1 C)  TempSrc: Oral   Oral  SpO2: 100% 100% 98% 99%  Weight:      Height:         Intake/Output Summary (Last 24 hours) at 09/11/2022 1447 Last data filed at 09/11/2022 0130 Gross per 24 hour  Intake 1002.35 ml  Output 700 ml  Net 302.35 ml    Filed Weights   09/09/22 2148 09/10/22 0812  Weight: 82.6 kg 82.5 kg    Examination:  General exam: Appears confused, disoriented, agitated Respiratory system: Clear to auscultation. Respiratory effort normal. Cardiovascular system: S1 & S2 heard, RRR. No JVD, murmurs, rubs, gallops or clicks. No pedal edema. Gastrointestinal system: Abdomen is nondistended, soft and nontender. No organomegaly or masses felt. Normal bowel sounds heard. Central nervous system: Awake and alert but confused. No focal neurological deficits. Extremities: Ankle splint in place Skin: No rashes, lesions or ulcers Psychiatry: Disoriented, restless and agitated    Data Reviewed: I have personally reviewed following labs and imaging studies  CBC: Recent Labs  Lab 09/09/22 2335 09/10/22 0515  WBC 8.4 13.4*  HGB 11.7* 13.7  HCT 36.4 42.5  MCV 101.1* 100.5*  PLT 380 441*    Basic Metabolic Panel: Recent Labs  Lab 09/09/22 2335 09/10/22 0515  NA 135 136  K 4.1 4.0  CL 105 105  CO2 24 22  GLUCOSE 90 86  BUN 17 16  CREATININE 0.83 0.80  CALCIUM 8.2* 9.0    GFR: Estimated Creatinine Clearance: 81.7 mL/min (by C-G formula based on SCr of 0.8 mg/dL). Liver Function Tests: Recent Labs  Lab 09/10/22 0515  AST 43*  ALT 20  ALKPHOS 209*  BILITOT 0.6  PROT 7.4  ALBUMIN 3.9    Coagulation Profile: Recent Labs  Lab 09/10/22 0515  INR 1.0    HbA1C: Recent Labs    09/09/22 2335  HGBA1C 5.5    CBG: Recent Labs  Lab 09/10/22 1549 09/10/22 1650 09/10/22 2123 09/11/22 0742 09/11/22 1126  GLUCAP 120* 108* 131* 113* 182*    Thyroid Function Tests: Recent Labs    09/09/22 2335  TSH 0.667      Radiology Studies: CT Ankle Left Wo Contrast  Result Date: 09/10/2022 CLINICAL DATA:  Ankle trauma, fracture,  xray done (Age >= 5y) EXAM: CT OF THE LEFT ANKLE WITHOUT CONTRAST TECHNIQUE: Multidetector CT imaging of the left ankle was performed according to the standard protocol. Multiplanar CT image reconstructions were also generated. RADIATION DOSE REDUCTION: This exam was performed according to the departmental dose-optimization program which includes automated exposure control, adjustment of the mA and/or kV according to patient size and/or use of iterative reconstruction technique. COMPARISON:  Plain films today FINDINGS: Bones/Joint/Cartilage There is an oblique fracture through the distal tibial metaphysis. No visible fibular fracture. Ankle mortise appears intact. Fracture through the anterior process of the calcaneus. Well corticated bone fragments noted along the anterior surface of the distal talus. Ligaments Suboptimally assessed by CT. Muscles and Tendons Grossly unremarkable Soft tissues Soft tissue swelling in the lower calf and ankle region. IMPRESSION: Oblique fracture through the distal tibial metaphysis. Minimally displaced fracture through the anterior process of the calcaneus. Electronically Signed   By: Charlett Nose M.D.   On: 09/10/2022 00:15  CT Ankle Right Wo Contrast  Result Date: 09/10/2022 CLINICAL DATA:  Ankle pain, stress fracture suspected, neg xray. Fall. EXAM: CT OF THE RIGHT ANKLE WITHOUT CONTRAST TECHNIQUE: Multidetector CT imaging of the right ankle was performed according to the standard protocol. Multiplanar CT image reconstructions were also generated. RADIATION DOSE REDUCTION: This exam was performed according to the departmental dose-optimization program which includes automated exposure control, adjustment of the mA and/or kV according to patient size and/or use of iterative reconstruction technique. COMPARISON:  None Available. FINDINGS: Bones/Joint/Cartilage There is an oblique nondisplaced fracture through the right medial malleolus. Multiple small bone fragments adjacent to  the tip of the lateral malleolus and lateral talus, many of which appear well corticated and chronic, but others are likely acute. Widening of the lateral ankle mortise. Nondisplaced fracture noted through the distal calcaneus laterally near the calcaneocuboid joint. Ligaments Suboptimally assessed by CT. Muscles and Tendons Grossly unremarkable. Soft tissues Soft tissue swelling diffusely, most pronounced laterally. IMPRESSION: Nondisplaced fracture through the medial malleolus and distal lateral calcaneus. Probable avulsed fragments off the lateral malleolus and/or lateral talus. Widening of the lateral ankle mortise. Electronically Signed   By: Charlett Nose M.D.   On: 09/10/2022 00:12   DG Ankle Complete Right  Result Date: 09/09/2022 CLINICAL DATA:  Mechanical fall.  Ankle pain and deformity. EXAM: RIGHT ANKLE - COMPLETE 3+ VIEW COMPARISON:  None Available. FINDINGS: There is no evidence of fracture, dislocation, or joint effusion. Small osseous fragment about the inferior aspect of the lateral malleolus, likely a chronic process. Osteophytes about the talonavicular joint suggesting arthropathy. Prominent plantar calcaneal spurring. Marked soft tissue swelling about lateral malleolus. IMPRESSION: Marked soft tissue swelling about the lateral malleolus without evidence of acute fracture or dislocation. Degenerative changes of the talonavicular joint. Electronically Signed   By: Larose Hires D.O.   On: 09/09/2022 22:21   DG Ankle Complete Left  Result Date: 09/09/2022 CLINICAL DATA:  Left ankle pain/deformity. EXAM: LEFT ANKLE COMPLETE - 3+ VIEW COMPARISON:  None Available. FINDINGS: There is oblique mildly displaced fracture of the distal tibia. No other appreciable fracture or dislocation. Osteophyte about the anterior aspect of the talus concerning for anterior ankle impingement. Marked soft tissue swelling about anteromedial aspect of the distal leg. IMPRESSION: 1. Oblique mildly displaced fracture of  the distal tibia. 2. Osteophyte about the anterior aspect of the talus concerning for anterior ankle impingement. 3. Marked soft tissue swelling about the anteromedial aspect of the distal leg. Electronically Signed   By: Larose Hires D.O.   On: 09/09/2022 22:17        Scheduled Meds:  atorvastatin  40 mg Oral Daily   enoxaparin (LOVENOX) injection  40 mg Subcutaneous Q24H   ibuprofen  600 mg Oral QID   insulin aspart  0-5 Units Subcutaneous QHS   insulin aspart  0-9 Units Subcutaneous TID WC   levothyroxine  112 mcg Oral Q0600   lumateperone tosylate  42 mg Oral Daily   midodrine  10 mg Oral TID WC   oxcarbazepine  1,200 mg Oral BID   pantoprazole  40 mg Oral Daily   PARoxetine  40 mg Oral Daily   prazosin  2 mg Oral QHS   QUEtiapine  300 mg Oral QHS   ranolazine  1,000 mg Oral BID   tiZANidine  4 mg Oral TID   trazodone  300 mg Oral QHS   Continuous Infusions:  sodium chloride 75 mL/hr at 09/11/22 0810     LOS: 1  day    Time spent: 35 minutes    Delfino Lovett, MD [facility] Pager [phone]  If 7PM-7AM, please contact night-coverage [email or web address] Password [facility] 09/11/2022, 2:47 PM

## 2022-09-11 NOTE — Progress Notes (Signed)
       CROSS COVER NOTE  NAME: Diana Quinn MRN: [id number] DOB : 10-Jun-1965    Concern as stated by nurse / staff   BPL: 79/65 MAP 71, HR: 82, O2: 92 R/A  Post Bolus VS: BP 102/50 (64) HR: 58; remains asymptomatic       Pertinent findings on chart review: 57 y.o. female with medical history significant of  PTSD/Bipolar/Schizophrenia, anxiety,Osteoporosis, DMII with history of hypogylcemia, CAD s/p MI with non-obstructive CAD on Cath per patient, Seizure d/o djd for the back , TIA , hx of gastric bypass for treatment of obesity.  She is admitted for nondisplaced medial malleolus and distal lateral calcaneus fracture s/p mechanical fall    Assessment and  Interventions   Assessment: Asymptomatic hypotension short term improvement after IV fluid bolus likely from multiple bp lowering med (pain and antiseizure meds in addition to antihypertensives) and decreased oral intake Additionally found to have acute urinary retention Plan: Hold sedatives and pain meds In and out cath for urinary retention       Donnie Mesa NP [facility] Cross Cover 7pm-7am - check amion for availability Pager [phone]

## 2022-09-11 NOTE — TOC Progression Note (Signed)
Transition of Care [facility]) - Progression Note    Patient Details  Name: Diana Quinn MRN: [id number] Date of Birth: November 16, 1965  [facility] CM/SW Contact  Marlowe Sax, RN Phone Number: 09/11/2022, 9:39 AM  Clinical Narrative:    Spoke to the patient and reviewed options, 1. Go to STR SNF and stay at least 30 days and sign over disability check 2. Go home with [facility] if we are able to find one in network with her insurance 3. Go home and go to outpatient pt She will discuss these options with her son and daughter in law whom she lives with and we will discuss later today   Expected Discharge Plan:  (tbd) Barriers to Discharge: Continued Medical Work up  Expected Discharge Plan and Services   Discharge Planning Services: CM Consult   Living arrangements for the past 2 months: Single Family Home                                       Social Determinants of Health (SDOH) Interventions SDOH Screenings   Food Insecurity: No Food Insecurity (09/10/2022)  Housing: Low Risk  (09/10/2022)  Transportation Needs: No Transportation Needs (09/10/2022)  Utilities: Not At Risk (09/10/2022)  Depression (PHQ2-9): Low Risk  (10/26/2018)  Tobacco Use: Low Risk  (09/09/2022)    Readmission Risk Interventions     No data to display

## 2022-09-12 ENCOUNTER — Encounter: Payer: Self-pay | Admitting: Internal Medicine

## 2022-09-12 ENCOUNTER — Inpatient Hospital Stay: Payer: MEDICAID

## 2022-09-12 ENCOUNTER — Inpatient Hospital Stay (HOSPITAL_COMMUNITY)
Admit: 2022-09-12 | Discharge: 2022-09-12 | Disposition: A | Discharge status: Home / Self Care | Payer: MEDICAID | Attending: Internal Medicine | Admitting: Internal Medicine

## 2022-09-12 DIAGNOSIS — R0609 Other forms of dyspnea: Secondary | ICD-10-CM | POA: Diagnosis not present

## 2022-09-12 DIAGNOSIS — S82302A Unspecified fracture of lower end of left tibia, initial encounter for closed fracture: Secondary | ICD-10-CM | POA: Diagnosis not present

## 2022-09-12 DIAGNOSIS — Z7189 Other specified counseling: Secondary | ICD-10-CM | POA: Diagnosis not present

## 2022-09-12 DIAGNOSIS — S82891A Other fracture of right lower leg, initial encounter for closed fracture: Secondary | ICD-10-CM | POA: Diagnosis not present

## 2022-09-12 DIAGNOSIS — Z515 Encounter for palliative care: Secondary | ICD-10-CM

## 2022-09-12 LAB — BASIC METABOLIC PANEL
Anion gap: 12 (ref 5–15)
BUN: 15 mg/dL (ref 6–20)
CO2: 16 mmol/L — ABNORMAL LOW (ref 22–32)
Calcium: 8.6 mg/dL — ABNORMAL LOW (ref 8.9–10.3)
Chloride: 115 mmol/L — ABNORMAL HIGH (ref 98–111)
Creatinine, Ser: 0.83 mg/dL (ref 0.44–1.00)
GFR, Estimated: >60 mL/min (ref >60)
Glucose, Bld: 124 mg/dL — ABNORMAL HIGH (ref 70–99)
Potassium: 4.5 mmol/L (ref 3.5–5.1)
Sodium: 143 mmol/L (ref 135–145)

## 2022-09-12 LAB — URINE DRUG SCREEN, QUALITATIVE (ARMC ONLY)
Amphetamines, Ur Screen: NOT DETECTED
Barbiturates, Ur Screen: NOT DETECTED
Benzodiazepine, Ur Scrn: NOT DETECTED
Cannabinoid 50 Ng, Ur ~~LOC~~: NOT DETECTED
Cocaine Metabolite,Ur ~~LOC~~: NOT DETECTED
MDMA (Ecstasy)Ur Screen: NOT DETECTED
Methadone Scn, Ur: NOT DETECTED
Opiate, Ur Screen: POSITIVE — AB
Phencyclidine (PCP) Ur S: NOT DETECTED
Tricyclic, Ur Screen: POSITIVE — AB

## 2022-09-12 LAB — BLOOD GAS, ARTERIAL
Acid-base deficit: 4.1 mmol/L — ABNORMAL HIGH (ref 0.0–2.0)
Bicarbonate: 20.9 mmol/L (ref 20.0–28.0)
O2 Saturation: 96.7 %
Patient temperature: 37
pCO2 arterial: 37 mmHg (ref 32–48)
pH, Arterial: 7.36 (ref 7.35–7.45)
pO2, Arterial: 90 mmHg (ref 83–108)

## 2022-09-12 LAB — CBC
HCT: 29.6 % — ABNORMAL LOW (ref 36.0–46.0)
Hemoglobin: 10 g/dL — ABNORMAL LOW (ref 12.0–15.0)
MCH: 32.4 pg (ref 26.0–34.0)
MCHC: 33.8 g/dL (ref 30.0–36.0)
MCV: 95.8 fL (ref 80.0–100.0)
Platelets: 286 10*3/uL (ref 150–400)
RBC: 3.09 MIL/uL — ABNORMAL LOW (ref 3.87–5.11)
RDW: 13.9 % (ref 11.5–15.5)
WBC: 14.4 10*3/uL — ABNORMAL HIGH (ref 4.0–10.5)
nRBC: 0 % (ref 0.0–0.2)

## 2022-09-12 LAB — ECHOCARDIOGRAM COMPLETE
Height: 63 in
S' Lateral: 3.5 cm
Weight: 2910.07 oz

## 2022-09-12 LAB — GLUCOSE, CAPILLARY
Glucose-Capillary: 110 mg/dL — ABNORMAL HIGH (ref 70–99)
Glucose-Capillary: 114 mg/dL — ABNORMAL HIGH (ref 70–99)
Glucose-Capillary: 128 mg/dL — ABNORMAL HIGH (ref 70–99)
Glucose-Capillary: 138 mg/dL — ABNORMAL HIGH (ref 70–99)
Glucose-Capillary: 148 mg/dL — ABNORMAL HIGH (ref 70–99)
Glucose-Capillary: 163 mg/dL — ABNORMAL HIGH (ref 70–99)

## 2022-09-12 MED ORDER — IOHEXOL 350 MG/ML SOLN
75.0000 mL | Freq: Once | INTRAVENOUS | Status: AC | PRN
  Administered 2022-09-12: 75 mL via INTRAVENOUS

## 2022-09-12 MED ORDER — OXCARBAZEPINE 300 MG PO TABS
600.0000 mg | ORAL_TABLET | Freq: Once | ORAL | Status: AC
  Administered 2022-09-12: 600 mg via ORAL
  Filled 2022-09-12: qty 2

## 2022-09-12 MED ORDER — HYDROMORPHONE HCL 1 MG/ML IJ SOLN
0.5000 mg | Freq: Once | INTRAMUSCULAR | Status: AC
  Administered 2022-09-12: 0.5 mg via INTRAVENOUS
  Filled 2022-09-12: qty 0.5

## 2022-09-12 MED ORDER — PROCHLORPERAZINE EDISYLATE 10 MG/2ML IJ SOLN
10.0000 mg | Freq: Once | INTRAMUSCULAR | Status: AC
  Administered 2022-09-12: 10 mg via INTRAVENOUS
  Filled 2022-09-12: qty 2

## 2022-09-12 NOTE — Progress Notes (Addendum)
PT Cancellation Note  Patient Details Name: Oliva Columbo MRN: [id number] DOB: [birth year]   Cancelled Treatment:     PT attempt. Per RN staff and discussion, pt has had change in mental status and awareness. Labs just drawn. Will hold PT until pt is more appropriate to participate. Remains NWB BLE.   923 am: Pt is being transferred to higher level of care. PT will re-eval when appropriate.   Rushie Chestnut 09/12/2022, 8:01 AM

## 2022-09-12 NOTE — Progress Notes (Signed)
OT Cancellation Note  Patient Details Name: Ankitha Inwood MRN: [id number] DOB: 18-Oct-1965   Cancelled Treatment:    Reason Eval/Treat Not Completed: Medical issues which prohibited therapy;Other (comment). OT continues to follow. Per care team, pt with acute change in metal status this AM. Plans to transfer to higher level of care. Of note, will need continue upon transfer orders to continue with therapy services once medically stabilized. Will hold at this time and re-attempt at a later date/time as available and pt medically appropriate for OT services.   Rockney Ghee, M.S., OTR/L 09/12/22, 9:40 AM

## 2022-09-12 NOTE — Consult Note (Signed)
Consultation Note Date: 09/12/2022   Patient Name: Diana Quinn  DOB: [birth year]  MRN: [id number]  Age / Sex: 57 y.o., female  PCP: Diana Eva, NP Referring Physician: Delfino Lovett, MD  Reason for Consultation: Establishing goals of care   HPI/Brief Hospital Course: 57 y.o. female  with past medical history of PTSD, bipolar disorder, schizophrenia, anxiety, osteoporosis, T2DM with history of hypoglycemia, CAD s/p MI, seizure disorder, DJD, TIA, multiple previous falls with fractures and history of gastric bypass surgery admitted from home on 09/09/2022 s/p mechanical fall.  ED course Found to have bilateral ankle fractures  Podiatry consulted-no indication for surgical intervention, conservative management, remain NWB 6-8 weeks followed by WBAT Would likely benefit from SNF due to NWB status  From chart review-[name] hesitant to d/c to SNF due to financial concerns  From chart review-[facility] AM of 7/5, prescription bottles including Percocet, Zanaflex, ibuprofen and Seroquel found at bedside-sent to pharmacy for safety   Palliative medicine was consulted for assisting with goals of care conversations  Subjective:  Extensive chart review has been completed prior to meeting patient including labs, vital signs, imaging, progress notes, orders, and available advanced directive documents from current and previous encounters.  Visited with Diana Quinn at her bedside. Asleep but easily awakens to verbal stimuli, safety sitter present at bedside.  Introduced myself as a Publishing rights manager as a member of the palliative care team. Explained palliative medicine is specialized medical care for people living with serious illness. It focuses on providing relief from the symptoms and stress of a serious illness. The goal is to improve quality of life for both the patient and the family.   Diana Quinn able to state her name, birth date and location, unable to share  year and unable to recall days of week backwards starting with Wednesday. During our conversations, Diana Quinn frequently repeats herself  and requires frequent redirection-sometimes successful at returning to original conversation. As she is able, Diana Quinn shares she recently moved in with her son-Diana Quinn and his fiance-Diana Quinn. Previously she was living in [facility] with her fiance who sadly passed away in 2022-06-08 on this year. Since returning to [facility] she has reestablished with PCP and unclear if she has reestablished with pain management specialist. She shares she has a total of 4 children, Diana Quinn is her only local child, others lives out of state but has contact with 3 of them, 1 estranged.  Diana Quinn able to share her understanding of current medical situation. Bilateral ankle fractures, does not require surgical intervention at this time, NWB. We discussed her options such as SNF versus HH placement, she is able to share her wish to return home with [facility] as she has financial concerns related to SNF placement.  Diana Quinn denies tobacco, ETOH or recreational drug use.  Called and spoke with Diana Quinn, all above information confirmed with son.  Diana Quinn shares the morning of admission, Diana Quinn was in her usual state of health. She was visiting with him in his bedroom, he noticed she had a moment of "spacing out" and then she fell. Diana Quinn shares these episodes of "spacing out" have happened in the past, quite frequently and result in her falling. He is unsure if this has been investigated further by medical staff in the past. Diana Quinn shares he is thankful an episode has happened while she is currently in the hospital so answers can be provided. In the past, Diana Quinn shares these episodes have not lasted as long as this. Diana Quinn shares  similar presentation of talking in circles and confusion.  Diana Quinn is unaware if Diana Quinn has completed AD documentation in the past.  Answered and addressed all questions  and concerns. Diana Quinn shares he is a long distance Naval architect and has limited access to answering his phone based on cell signal.  During visit, NT reported [name] without an episode of voiding today. Bladder scan revealed 499 cc, bladder distention palpable with tenderness, this is repeat episode of retention, foley to be placed-Diana Quinn aware.  All questions/concerns addressed. PMT will continue to follow and support patient as needed.  Objective: Primary Diagnoses: Present on Admission:  Ankle fracture  Vital Signs: BP 134/69 (BP Location: Left Arm)   Pulse 97   Temp 100.3 F (37.9 C)   Resp 20   Ht 5\' 3"  (1.6 m)   Wt 82.5 kg   SpO2 96%   BMI 32.22 kg/m  Pain Scale: Faces   Pain Score: Asleep  IO: Intake/output summary:  Intake/Output Summary (Last 24 hours) at 09/12/2022 1708 Last data filed at 09/12/2022 1027 Gross per 24 hour  Intake 480 ml  Output 800 ml  Net -320 ml    LBM: Last BM Date : 09/08/22 Baseline Weight: Weight: 82.6 kg Most recent weight: Weight: 82.5 kg      Assessment and Plan  SUMMARY OF RECOMMENDATIONS   Full Code-Full Scope New onset confusion 7/5 with unclear etiology, possible medication related, recommend considering CT scan, neuro and/or psych involvement if no improvement as son shares similar events happen frequently PMT to continue to follow for ongoing needs and support  Discussed With: Primary team and nursing staff   Thank you for this consult and allowing Palliative Medicine to participate in the care of Diana Quinn. Palliative medicine will continue to follow and assist as needed.   Time Total: 75 minutes  Time spent includes: Detailed review of medical records (labs, imaging, vital signs), medically appropriate exam (mental status, respiratory, cardiac, skin), discussed with treatment team, counseling and educating patient, family and staff, documenting clinical information, medication management and coordination of  care.   Signed by: Diana Deed, DNP, AGNP-C Palliative Medicine    Please contact Palliative Medicine Team phone at [phone] for questions and concerns.  For individual provider: See Loretha Stapler

## 2022-09-12 NOTE — TOC Progression Note (Signed)
Transition of Care [facility]) - Progression Note    Patient Details  Name: Diana Quinn MRN: [id number] Date of Birth: 1965/03/12  Transition of Care [facility]) CM/SW Contact  Marlowe Sax, RN Phone Number: 09/12/2022, 3:59 PM  Clinical Narrative:    Attempted to speak to the patient about DC plan, she is sound asleep and not able to talk, TOC will continue to follow and assist with DC plan and needs   Expected Discharge Plan:  (tbd) Barriers to Discharge: Continued Medical Work up  Expected Discharge Plan and Services   Discharge Planning Services: CM Consult   Living arrangements for the past 2 months: Single Family Home                                       Social Determinants of Health (SDOH) Interventions SDOH Screenings   Food Insecurity: No Food Insecurity (09/10/2022)  Housing: Low Risk  (09/10/2022)  Transportation Needs: No Transportation Needs (09/10/2022)  Utilities: Not At Risk (09/10/2022)  Depression (PHQ2-9): Low Risk  (10/26/2018)  Tobacco Use: Low Risk  (09/12/2022)    Readmission Risk Interventions     No data to display

## 2022-09-12 NOTE — Progress Notes (Signed)
       CROSS COVER NOTE  NAME: Diana Quinn MRN: [id number] DOB : [birth year]    Concern as stated by nurse / staff    Pt states that she actually takes 600mg  of trileptal twice a day instead of the 1200mg  that we have ordered. She states that her PCP put in the new order and not her neurologist so she called the make sure with the neurologist that she should be taking 600mg  x2/day and that's is what she has been taking instead. Do you want to change the order or have her continue to take 1200mg  tonight She was admitted 7/2 with Bilateral ankle fx     Pertinent findings on chart review:   Assessment and  Interventions   Assessment:  Plan: One time dose trileptal 600 ordered Confirm with neurologist in am current regimen       Donnie Mesa NP [facility] Cross Cover 7pm-7am - check amion for availability Pager [phone]

## 2022-09-12 NOTE — Progress Notes (Signed)
Found two more medication bottles within pt's belonging bag. A bottle of Advil and Seroquel. Verified medications with charge nurse and sent medication bottles to pharmacy.

## 2022-09-12 NOTE — Progress Notes (Signed)
Pt awake, alert and oriented x 4. Pt cannot recall the what had happen this morning.

## 2022-09-12 NOTE — Progress Notes (Signed)
*  PRELIMINARY RESULTS* Echocardiogram 2D Echocardiogram has been performed.  Diana Quinn 09/12/2022, 10:57 AM

## 2022-09-12 NOTE — Progress Notes (Signed)
       CROSS COVER NOTE  NAME: Seleste Jakeway MRN: [id number] DOB : Aug 30, 1965    Concern as stated by nurse / staff      pt is complaining of abdominal pain. Repeatedly says "I feel so sick." She's been throwing up; given zofran IV. Bladder scan is 173; last I/o cath was @19 :30. Last BM: 7/1. BP 134/96 (108); HR: 79 100% in 2L [place]. CBG: 163; Unsure what is going on with her. Please advise.    Pertinent findings on chart review:   Assessment and  Interventions   Assessment: Patient continue with multiple pain complaints, diaphoretic Likely withdrawal - too early from last percocet taken to initiate buprenorphine Plan:  1 dose compazine KUB no ileus or obstruction Opioid withdrawal - Severity of reported pain and anxious behaviors - unable to hold narcotic - one dose dilaudid ordered       Donnie Mesa NP [facility] Cross Cover 7pm-7am - check amion for availability Pager [phone]

## 2022-09-12 NOTE — Progress Notes (Signed)
   09/12/22 0839  Patient Belongings  Home Medications Sent to pharmacy in secure med bag   Found a bottle of percocet 7.5 mg-325 mg and a bottle of Zanaflex  4 mg inside pt's purse. Percocet prescription per bottle was filled 08/26/2022 and instructions for three times/day. There were only 6 tablets remaining inside the bottle as 09/12/2022. Notified attending MD. Sent medications to pharmacy.

## 2022-09-12 NOTE — Progress Notes (Signed)
Pt disoriented, restless, and clammy. Per night shift nurse, staff changed gown 3 times. Obtained vitals and glucose. Bathed, full linen changed, and gown changed. Notified attending MD. Continue with plan of care.

## 2022-09-13 DIAGNOSIS — E039 Hypothyroidism, unspecified: Secondary | ICD-10-CM | POA: Diagnosis not present

## 2022-09-13 DIAGNOSIS — G40909 Epilepsy, unspecified, not intractable, without status epilepticus: Secondary | ICD-10-CM | POA: Diagnosis not present

## 2022-09-13 DIAGNOSIS — Z7189 Other specified counseling: Secondary | ICD-10-CM | POA: Diagnosis not present

## 2022-09-13 DIAGNOSIS — F32A Depression, unspecified: Secondary | ICD-10-CM

## 2022-09-13 DIAGNOSIS — S82891A Other fracture of right lower leg, initial encounter for closed fracture: Secondary | ICD-10-CM | POA: Diagnosis not present

## 2022-09-13 DIAGNOSIS — F419 Anxiety disorder, unspecified: Secondary | ICD-10-CM | POA: Diagnosis not present

## 2022-09-13 DIAGNOSIS — S82302A Unspecified fracture of lower end of left tibia, initial encounter for closed fracture: Secondary | ICD-10-CM | POA: Diagnosis not present

## 2022-09-13 LAB — COMPREHENSIVE METABOLIC PANEL
ALT: 13 U/L (ref 0–44)
AST: 28 U/L (ref 15–41)
Albumin: 2.8 g/dL — ABNORMAL LOW (ref 3.5–5.0)
Alkaline Phosphatase: 131 U/L — ABNORMAL HIGH (ref 38–126)
Anion gap: 6 (ref 5–15)
BUN: 12 mg/dL (ref 6–20)
CO2: 23 mmol/L (ref 22–32)
Calcium: 8 mg/dL — ABNORMAL LOW (ref 8.9–10.3)
Chloride: 113 mmol/L — ABNORMAL HIGH (ref 98–111)
Creatinine, Ser: 0.83 mg/dL (ref 0.44–1.00)
GFR, Estimated: >60 mL/min (ref >60)
Glucose, Bld: 111 mg/dL — ABNORMAL HIGH (ref 70–99)
Potassium: 3.5 mmol/L (ref 3.5–5.1)
Sodium: 142 mmol/L (ref 135–145)
Total Bilirubin: 0.2 mg/dL — ABNORMAL LOW (ref 0.3–1.2)
Total Protein: 5.5 g/dL — ABNORMAL LOW (ref 6.5–8.1)

## 2022-09-13 LAB — GLUCOSE, CAPILLARY
Glucose-Capillary: 93 mg/dL (ref 70–99)
Glucose-Capillary: 142 mg/dL — ABNORMAL HIGH (ref 70–99)
Glucose-Capillary: 129 mg/dL — ABNORMAL HIGH (ref 70–99)
Glucose-Capillary: 149 mg/dL — ABNORMAL HIGH (ref 70–99)

## 2022-09-13 LAB — CBC
HCT: 27.2 % — ABNORMAL LOW (ref 36.0–46.0)
Hemoglobin: 8.8 g/dL — ABNORMAL LOW (ref 12.0–15.0)
MCH: 31.9 pg (ref 26.0–34.0)
MCHC: 32.4 g/dL (ref 30.0–36.0)
MCV: 98.6 fL (ref 80.0–100.0)
Platelets: 286 10*3/uL (ref 150–400)
RBC: 2.76 MIL/uL — ABNORMAL LOW (ref 3.87–5.11)
RDW: 14.7 % (ref 11.5–15.5)
WBC: 8.5 10*3/uL (ref 4.0–10.5)
nRBC: 0 % (ref 0.0–0.2)

## 2022-09-13 MED ORDER — LUMATEPERONE TOSYLATE 42 MG PO CAPS
42.0000 mg | ORAL_CAPSULE | Freq: Every day | ORAL | Status: DC
  Administered 2022-09-14 – 2022-09-25 (×12): 42 mg via ORAL
  Filled 2022-09-13 (×13): qty 1

## 2022-09-13 MED ORDER — OXCARBAZEPINE 300 MG PO TABS
600.0000 mg | ORAL_TABLET | Freq: Two times a day (BID) | ORAL | Status: DC
  Administered 2022-09-13 – 2022-09-25 (×24): 600 mg via ORAL
  Filled 2022-09-13 (×25): qty 2

## 2022-09-13 MED ORDER — RISPERIDONE 0.5 MG PO TABS
0.5000 mg | ORAL_TABLET | ORAL | Status: DC
  Administered 2022-09-13: 0.5 mg via ORAL
  Filled 2022-09-13: qty 1

## 2022-09-13 MED ORDER — ADULT MULTIVITAMIN W/MINERALS CH
1.0000 | ORAL_TABLET | Freq: Every day | ORAL | Status: DC
  Administered 2022-09-13 – 2022-09-25 (×13): 1 via ORAL
  Filled 2022-09-13 (×13): qty 1

## 2022-09-13 MED ORDER — ONDANSETRON 4 MG PO TBDP: 4.0000 mg | ORAL_TABLET | Freq: Four times a day (QID) | ORAL | Status: AC | PRN

## 2022-09-13 MED ORDER — DIAZEPAM 2 MG PO TABS
2.0000 mg | ORAL_TABLET | Freq: Two times a day (BID) | ORAL | Status: DC
  Administered 2022-09-13 – 2022-09-14 (×2): 2 mg via ORAL
  Filled 2022-09-13 (×2): qty 1

## 2022-09-13 MED ORDER — DIAZEPAM 5 MG PO TABS
5.0000 mg | ORAL_TABLET | Freq: Two times a day (BID) | ORAL | Status: DC
  Administered 2022-09-13: 5 mg via ORAL
  Filled 2022-09-13: qty 1

## 2022-09-13 MED ORDER — HYDROXYZINE HCL 25 MG PO TABS
25.0000 mg | ORAL_TABLET | Freq: Four times a day (QID) | ORAL | Status: AC | PRN
  Administered 2022-09-14 – 2022-09-16 (×3): 25 mg via ORAL
  Filled 2022-09-13 (×5): qty 1

## 2022-09-13 MED ORDER — LOPERAMIDE HCL 2 MG PO CAPS: 2.0000 mg | ORAL_CAPSULE | ORAL | Status: AC | PRN

## 2022-09-13 MED ORDER — THIAMINE MONONITRATE 100 MG PO TABS
100.0000 mg | ORAL_TABLET | Freq: Every day | ORAL | Status: DC
  Administered 2022-09-14 – 2022-09-25 (×12): 100 mg via ORAL
  Filled 2022-09-13 (×12): qty 1

## 2022-09-13 MED ORDER — CHLORHEXIDINE GLUCONATE CLOTH 2 % EX PADS
6.0000 | MEDICATED_PAD | Freq: Every day | CUTANEOUS | Status: DC
  Administered 2022-09-13 – 2022-09-25 (×12): 6 via TOPICAL

## 2022-09-13 MED ORDER — ACETAMINOPHEN 500 MG PO TABS
1000.0000 mg | ORAL_TABLET | Freq: Once | ORAL | Status: AC
  Administered 2022-09-13: 1000 mg via ORAL
  Filled 2022-09-13: qty 2

## 2022-09-13 MED ORDER — LORAZEPAM 1 MG PO TABS: 1.0000 mg | ORAL_TABLET | Freq: Four times a day (QID) | ORAL | Status: AC | PRN

## 2022-09-13 MED ORDER — THIAMINE HCL 100 MG/ML IJ SOLN
100.0000 mg | Freq: Once | INTRAMUSCULAR | Status: AC
  Administered 2022-09-13: 100 mg via INTRAMUSCULAR
  Filled 2022-09-13: qty 2

## 2022-09-13 NOTE — Plan of Care (Signed)

## 2022-09-13 NOTE — Progress Notes (Signed)
PROGRESS NOTE    Diana Quinn  ION:[id number] DOB: [birth year] DOA: 09/09/2022 PCP: Orson Eva, NP (Confirm with patient/family/NH records and if not entered, this HAS to be entered at [facility] point of entry. "No PCP" if truly none.)   Brief Narrative: (Start on day 1 of progress note - keep it brief and live) 57 y.o. female with medical history significant of  PTSD/Bipolar/Schizophrenia, anxiety,Osteoporosis, DMII with history of hypogylcemia, CAD s/p MI with non-obstructive CAD on Cath per patient, Seizure d/o djd for the back , TIA , hx of gastric bypass for treatment of obesity.  She is admitted for nondisplaced medial malleolus and distal lateral calcaneus fracture s/p mechanical fall  7/3: Podiatry recommends conservative management 7/4: Patient remains hypotensive but not symptomatic.  Still debating if she would consider rehab due to financial constraints 7/5: Signs and symptoms of acute drug intoxication/withdrawal this morning 7/6: Psych consult-decreased Valium to 2 mg twice daily  Assessment & Plan:   Principal Problem:   Ankle fracture Active Problems:   Closed fracture of left distal tibia  Acute metabolic encephalopathy CT chest negative for PE, chest x-ray negative.  ABG within normal limit Patient was found to be confused, disoriented clammy and restless.  She was very tremulous and behavior concerning for drug intoxication/withdrawal Nursing found a bottle of Percocet, Zanaflex, Seroquel, Advil in her purse.  Unsure if she over ingested any of this or something else.  She did receive 1 dose of Compazine and Dilaudid.  Difficult to know if she reacted to this Discontinued safety sitter She is back to her normal baseline mental status.  Seen by psychiatry.  Decreased Valium to 2 mg p.o. twice daily.  Continue Caplyta and Seroquel  Left distal tibial fracture/nondisplaced and right nondisplaced ankle fracture -Appreciate podiatry input Conservative management plan.   Nonweightbearing bilaterally for 4 to 6 weeks followed by weightbearing as tolerated in cam boot PT and OT evaluation recommends SNF.  TOC working on placement.  Son is requesting SNF placement  PTSD/Bipolar/Anxiety  -Continue Paxil, Seroquel, trazodone, Caplyta Seen by psychiatry.  Continue Caplyta and Seroquel.  Decreased Valium 2 mg p.o. twice daily  Hypotension Her systolic blood pressure has been running 100-120 at home as per patient.  Resolved now.  Required midodrine for 48 hours and now stopped  Acute urinary retention  Foley catheter placed on 7/5   Osteoporosis  - on fosamax   DMII -Sliding scale insulin for now - A1c 5.5   CAD s/p MI  -CAD non-obstructive but per patient cath noted small caliber vessels  -continue ranxea   DJD of the back  -chronic back pain  -follows with pcp and pain management    Hypothyroidism -Continue synthroid   TIA hx    Obesity  -s/p gastric bypass    Hx if Seizure d/o  -on trileptal 600 mg twice daily.     DVT prophylaxis: Lovenox enoxaparin (LOVENOX) injection 40 mg Start: 09/10/22 2200     Code Status: Full code Family Communication: None at bedside.  I tried calling son twice but had no luck Disposition Plan: Medically stable, SNF when bed available versus home with home health   Consultants:  Podiatry Psychiatry Palliative care  Subjective:  No new complaints.  Hopeful to go to SNF to get stronger  Objective: Vitals:   09/12/22 2206 09/13/22 0015 09/13/22 0316 09/13/22 0750  BP: 135/73 (!) 99/50 130/70 119/81  Pulse: 88 80 100 85  Resp:  18  17  Temp:  99.5  F (37.5 C)  97.8 F (36.6 C)  TempSrc:      SpO2: 100% 97% 92% 96%  Weight:      Height:        Intake/Output Summary (Last 24 hours) at 09/13/2022 1122 Last data filed at 09/13/2022 1021 Gross per 24 hour  Intake 480 ml  Output 875 ml  Net -395 ml    Filed Weights   09/09/22 2148 09/10/22 0812  Weight: 82.6 kg 82.5 kg     Examination:  General exam: 57 year old female lying in bed comfortably without any acute distress Respiratory system: Clear to auscultation. Respiratory effort normal. Cardiovascular system: S1 & S2 heard, RRR. No JVD, murmurs, rubs, gallops or clicks. No pedal edema. Gastrointestinal system: Abdomen is nondistended, soft and nontender. No organomegaly or masses felt. Normal bowel sounds heard. Central nervous system: Awake and alert. No focal neurological deficits. Extremities: Ankle splint in place Skin: No rashes, lesions or ulcers Psychiatry: Normal mood and affect    Data Reviewed: I have personally reviewed following labs and imaging studies  CBC: Recent Labs  Lab 09/09/22 2335 09/10/22 0515 09/12/22 0742 09/13/22 0446  WBC 8.4 13.4* 14.4* 8.5  HGB 11.7* 13.7 10.0* 8.8*  HCT 36.4 42.5 29.6* 27.2*  MCV 101.1* 100.5* 95.8 98.6  PLT 380 441* 286 286    Basic Metabolic Panel: Recent Labs  Lab 09/09/22 2335 09/10/22 0515 09/12/22 0742 09/13/22 0446  NA 135 136 143 142  K 4.1 4.0 4.5 3.5  CL 105 105 115* 113*  CO2 24 22 16* 23  GLUCOSE 90 86 124* 111*  BUN 17 16 15 12   CREATININE 0.83 0.80 0.83 0.83  CALCIUM 8.2* 9.0 8.6* 8.0*    GFR: Estimated Creatinine Clearance: 78.8 mL/min (by C-G formula based on SCr of 0.83 mg/dL). Liver Function Tests: Recent Labs  Lab 09/10/22 0515 09/13/22 0446  AST 43* 28  ALT 20 13  ALKPHOS 209* 131*  BILITOT 0.6 0.2*  PROT 7.4 5.5*  ALBUMIN 3.9 2.8*    Coagulation Profile: Recent Labs  Lab 09/10/22 0515  INR 1.0    HbA1C: No results for input(s): "HGBA1C" in the last 72 hours.  CBG: Recent Labs  Lab 09/12/22 0844 09/12/22 1149 09/12/22 1649 09/12/22 2105 09/13/22 0812  GLUCAP 110* 148* 114* 128* 93    Thyroid Function Tests: No results for input(s): "TSH", "T4TOTAL", "FREET4", "T3FREE", "THYROIDAB" in the last 72 hours.    Radiology Studies: ECHOCARDIOGRAM COMPLETE  Result Date: 09/12/2022     ECHOCARDIOGRAM REPORT   Patient Name:   AKYLA HAVERFIELD Date of Exam: 09/12/2022 Medical Rec #:  [id number]     Height:       63.0 in Accession #:    [id number]    Weight:       181.9 lb Date of Birth:  [birth year]     BSA:          1.857 m Patient Age:    56 years      BP:           134/96 mmHg Patient Gender: F             HR:           79 bpm. Exam Location:  [facility] Procedure: 2D Echo, Cardiac Doppler and Color Doppler Indications:     Dyspnea R06.00  History:         Patient has no prior history of Echocardiogram examinations.  Previous Myocardial Infarction, TIA; Risk Factors:Hypertension.  Sonographer:     Cristela Blue Referring Phys:  [id number] Sean Malinowski [facility] Diagnosing Phys: Julien Nordmann MD  Sonographer Comments: Technically challenging study due to limited acoustic windows and no apical window. Image acquisition challenging due to patient behavioral factors. IMPRESSIONS  1. Left ventricular ejection fraction, by estimation, is 60 to 65%. The left ventricle has normal function. The left ventricle has no regional wall motion abnormalities. Left ventricular diastolic parameters are indeterminate.  2. Right ventricular systolic function is normal. The right ventricular size is normal. Tricuspid regurgitation signal is inadequate for assessing PA pressure.  3. The mitral valve is normal in structure. No evidence of mitral valve regurgitation. No evidence of mitral stenosis.  4. The aortic valve is normal in structure. Aortic valve regurgitation is not visualized. No aortic stenosis is present.  5. The inferior vena cava is normal in size with greater than 50% respiratory variability, suggesting right atrial pressure of 3 mmHg. FINDINGS  Left Ventricle: Left ventricular ejection fraction, by estimation, is 60 to 65%. The left ventricle has normal function. The left ventricle has no regional wall motion abnormalities. The left ventricular internal cavity size was normal in size. There is  no left ventricular  hypertrophy. Left ventricular diastolic parameters are indeterminate. Right Ventricle: The right ventricular size is normal. No increase in right ventricular wall thickness. Right ventricular systolic function is normal. Tricuspid regurgitation signal is inadequate for assessing PA pressure. Left Atrium: Left atrial size was normal in size. Right Atrium: Right atrial size was normal in size. Pericardium: There is no evidence of pericardial effusion. Mitral Valve: The mitral valve is normal in structure. No evidence of mitral valve regurgitation. No evidence of mitral valve stenosis. Tricuspid Valve: The tricuspid valve is normal in structure. Tricuspid valve regurgitation is not demonstrated. No evidence of tricuspid stenosis. Aortic Valve: The aortic valve is normal in structure. Aortic valve regurgitation is not visualized. No aortic stenosis is present. Pulmonic Valve: The pulmonic valve was normal in structure. Pulmonic valve regurgitation is not visualized. No evidence of pulmonic stenosis. Aorta: The aortic root is normal in size and structure. Venous: The inferior vena cava is normal in size with greater than 50% respiratory variability, suggesting right atrial pressure of 3 mmHg. IAS/Shunts: No atrial level shunt detected by color flow Doppler.  LEFT VENTRICLE PLAX 2D LVIDd:         4.90 cm LVIDs:         3.50 cm LV PW:         1.10 cm LV IVS:        0.90 cm LVOT diam:     2.10 cm LVOT Area:     3.46 cm  LEFT ATRIUM         Index LA diam:    2.80 cm 1.51 cm/m   AORTA Ao Root diam: 2.80 cm TRICUSPID VALVE TR Peak grad:   10.5 mmHg TR Vmax:        162.00 cm/s  SHUNTS Systemic Diam: 2.10 cm Julien Nordmann MD Electronically signed by Julien Nordmann MD Signature Date/Time: 09/12/2022/12:26:20 PM    Final    CT Angio Chest Pulmonary Embolism (PE) W or WO Contrast  Result Date: 09/12/2022 CLINICAL DATA:  Leg fracture.  Concern for pulmonary embolism. EXAM: CT ANGIOGRAPHY CHEST WITH CONTRAST TECHNIQUE:  Multidetector CT imaging of the chest was performed using the standard protocol during bolus administration of intravenous contrast. Multiplanar CT image reconstructions and MIPs were obtained to  evaluate the vascular anatomy. RADIATION DOSE REDUCTION: This exam was performed according to the departmental dose-optimization program which includes automated exposure control, adjustment of the mA and/or kV according to patient size and/or use of iterative reconstruction technique. CONTRAST:  75mL OMNIPAQUE IOHEXOL 350 MG/ML SOLN COMPARISON:  None Available. FINDINGS: Cardiovascular: No filling defects within the pulmonary arteries to suggest acute pulmonary embolism. Mediastinum/Nodes: No axillary or supraclavicular adenopathy. No mediastinal or hilar adenopathy. No pericardial fluid. Esophagus normal. Lungs/Pleura: No pulmonary infarction. No pneumonia. No pleural fluid. No pneumothorax Upper Abdomen: Surgical staples in the gastric cardia. No acute findings Musculoskeletal: With chronic lumbar compression fracture. No acute findings Review of the MIP images confirms the above findings. IMPRESSION: 1. No evidence acute pulmonary embolism. 2. No acute pulmonary findings. 3. Chronic lumbar compression fracture. Electronically Signed   By: Genevive Bi M.D.   On: 09/12/2022 10:53   DG Chest Port 1 View  Result Date: 09/12/2022 CLINICAL DATA:  Dyspnea. EXAM: PORTABLE CHEST 1 VIEW COMPARISON:  12/25/2021 FINDINGS: Cardiac silhouette is normal in size. No mediastinal or hilar masses. Clear lungs.  No pleural effusion or pneumothorax. Skeletal structures are grossly intact. IMPRESSION: No active disease. Electronically Signed   By: Amie Portland M.D.   On: 09/12/2022 10:08   DG Abd 1 View  Result Date: 09/12/2022 CLINICAL DATA:  Abdominal pain, vomiting EXAM: ABDOMEN - 1 VIEW COMPARISON:  CT abdomen/pelvis dated 09/24/2018 FINDINGS: Nonobstructive bowel gas pattern. Scattered surgical clips throughout the  abdomen/pelvis, with a gastric suture line in the left upper quadrant. Visualized osseous structures are within normal limits. Lung bases are clear. IMPRESSION: Nonobstructive bowel gas pattern. Electronically Signed   By: Charline Bills M.D.   On: 09/12/2022 01:41        Scheduled Meds:  atorvastatin  40 mg Oral Daily   diazepam  2 mg Oral BID   enoxaparin (LOVENOX) injection  40 mg Subcutaneous Q24H   ibuprofen  600 mg Oral QID   insulin aspart  0-5 Units Subcutaneous QHS   insulin aspart  0-9 Units Subcutaneous TID WC   levothyroxine  112 mcg Oral Q0600   lumateperone tosylate  42 mg Oral Daily   multivitamin with minerals  1 tablet Oral Daily   oxcarbazepine  600 mg Oral BID   pantoprazole  40 mg Oral Daily   PARoxetine  40 mg Oral Daily   prazosin  2 mg Oral QHS   QUEtiapine  300 mg Oral QHS   ranolazine  1,000 mg Oral BID   [START ON 09/14/2022] thiamine  100 mg Oral Daily   tiZANidine  4 mg Oral TID   trazodone  300 mg Oral QHS       LOS: 3 days    Time spent: 35 minutes    Delfino Lovett, MD [facility] Pager [phone]  If 7PM-7AM, please contact night-coverage [email or web address] Password [email or web address] 09/13/2022, 11:22 AM

## 2022-09-13 NOTE — Plan of Care (Signed)

## 2022-09-13 NOTE — Progress Notes (Signed)
Daily Progress Note   Patient Name: Diana Quinn       Date: 09/13/2022 DOB: March 08, 1966  Age: 57 y.o. MRN#: [id number] Attending Physician: Delfino Lovett, MD Primary Care Physician: Orson Eva, NP Admit Date: 09/09/2022  Reason for Consultation/Follow-up: Establishing goals of care  HPI/Brief Hospital Review: 57 y.o. female  with past medical history of PTSD, bipolar disorder, schizophrenia, anxiety, osteoporosis, T2DM with history of hypoglycemia, CAD s/p MI, seizure disorder, DJD, TIA, multiple previous falls with fractures and history of gastric bypass surgery admitted from home on 09/09/2022 s/p mechanical fall.   ED course Found to have bilateral ankle fractures   Podiatry consulted-no indication for surgical intervention, conservative management, remain NWB 6-8 weeks followed by WBAT Would likely benefit from SNF due to NWB status   From chart review-Ms. Norell hesitant to d/c to SNF due to financial concerns   From chart review-[facility] AM of 7/5, prescription bottles including Percocet, Zanaflex, ibuprofen and Seroquel found at bedside-sent to pharmacy for safety    Palliative medicine was consulted for assisting with goals of care conversations  Subjective: Extensive chart review has been completed prior to meeting patient including labs, vital signs, imaging, progress notes, orders, and available advanced directive documents from current and previous encounters.    Visited with Diana Quinn at her bedside. Awake, alert, oriented x4, mentation much improved from yesterdays visit. Shares she can recall our visit and conversations from yesterday, shares she remembers being confused.   She complains of insomnia-review of record, being followed by psychiatry who is managing insomnia. Shares  her pain is well controlled, has moments of increased pain in her left foot but is relieved with position changes.  Shares she had a conversation with her son-Diana Quinn earlier today and they discussed finances-he has offered to help with her bills to allow her to be able to go to SNF rehab. She is now willing to discharge to SNF.  We discussed Advanced Directives, she is interested in completing. Chaplain services consulted to assist with completion. She wishes to appoint her son-Diana Quinn as first HCPOA and her son-Diana Quinn as second. She also requests I add Diana Quinn's contact information to her chart so he can be called if needed. Reviewed Living Will, she shares she would never want to be kept alive long term artificially, including trach/PEG.  Answered and  addressed all questions and concerns. PMT to continue to follow for ongoing needs and support.   Thank you for allowing the Palliative Medicine Team to assist in the care of this patient.  Total time:  35 minutes  Time spent includes: Detailed review of medical records (labs, imaging, vital signs), medically appropriate exam (mental status, respiratory, cardiac, skin), discussed with treatment team, counseling and educating patient, family and staff, documenting clinical information, medication management and coordination of care.  Leeanne Deed, DNP, AGNP-C Palliative Medicine   Please contact Palliative Medicine Team phone at [phone] for questions and concerns.

## 2022-09-13 NOTE — Progress Notes (Signed)
   09/13/22 1100  Spiritual Encounters  Type of Visit Attempt (pt unavailable);Initial  Reason for visit Advance directives  OnCall Visit Yes   Chaplain responded to [facility] consult to provide assistance with AD. Patient asleep. Chaplain will follow up.

## 2022-09-13 NOTE — Progress Notes (Signed)
   09/13/22 1500  Spiritual Encounters  Type of Visit Follow up;Attempt (pt unavailable)  Reason for visit Advance directives  OnCall Visit Yes   Chaplain made second attempt to provide help with AD. Patient sleeping.

## 2022-09-13 NOTE — TOC Progression Note (Signed)
Transition of Care [facility]) - Progression Note    Patient Details  Name: Diana Quinn MRN: [id number] Date of Birth: December 22, 1965  Transition of Care [facility]) CM/SW Contact  Colette Ribas, [place] Phone Number: 09/13/2022, 12:41 PM  Clinical Narrative:     CSW spoke with Patient regarding OT recommendations. She expressed she has no preference. FL2 Create & Referrals sent to local facilities per her request to remain local.   Expected Discharge Plan:  (tbd) Barriers to Discharge: Continued Medical Work up  Expected Discharge Plan and Services   Discharge Planning Services: CM Consult   Living arrangements for the past 2 months: Single Family Home                                       Social Determinants of Health (SDOH) Interventions SDOH Screenings   Food Insecurity: No Food Insecurity (09/10/2022)  Housing: Low Risk  (09/10/2022)  Transportation Needs: No Transportation Needs (09/10/2022)  Utilities: Not At Risk (09/10/2022)  Depression (PHQ2-9): Low Risk  (10/26/2018)  Tobacco Use: Low Risk  (09/12/2022)    Readmission Risk Interventions     No data to display

## 2022-09-13 NOTE — Progress Notes (Signed)
   09/13/22 1900  Spiritual Encounters  Type of Visit Follow up  Care provided to: Patient  Referral source Nurse (RN/NT/LPN)  Reason for visit Advance directives  OnCall Visit Yes   Chaplain followed up with patient to help facilitate completion of AD. Document needs to be notarized.

## 2022-09-13 NOTE — Progress Notes (Signed)
PROGRESS NOTE    Diana Quinn  ZOX:[id number] DOB: [birth year] DOA: 09/09/2022 PCP: Diana Eva, NP (Confirm with patient/family/NH records and if not entered, this HAS to be entered at [facility] point of entry. "No PCP" if truly none.)   Brief Narrative: (Start on day 1 of progress note - keep it brief and live) 57 y.o. female with medical history significant of  PTSD/Bipolar/Schizophrenia, anxiety,Osteoporosis, DMII with history of hypogylcemia, CAD s/p MI with non-obstructive CAD on Cath per patient, Seizure d/o djd for the back , TIA , hx of gastric bypass for treatment of obesity.  She is admitted for nondisplaced medial malleolus and distal lateral calcaneus fracture s/p mechanical fall  7/3: Podiatry recommends conservative management 7/4: Patient remains hypotensive but not symptomatic.  Still debating if she would consider rehab due to financial constraints 7/5: Signs and symptoms of acute drug intoxication/withdrawal this morning  Assessment & Plan:   Principal Problem:   Ankle fracture Active Problems:   Closed fracture of left distal tibia  Acute metabolic encephalopathy CT chest negative for PE, chest x-ray negative.  ABG within normal limit Patient was found to be confused, disoriented clammy and restless.  She was very tremulous and behavior concerning for drug intoxication/withdrawal Nursing found a bottle of Percocet, Zanaflex, Seroquel, Advil in her purse.  Unsure if she over ingested any of this or something else.  She did receive 1 dose of Compazine and Dilaudid.  Difficult to know if she reacted to this Recruitment consultant for now.  Low threshold in transferring to ICU/stepdown if need Requested nursing to call security for any suspicious behavior and turning all her medications  Left distal tibial fracture/nondisplaced and right nondisplaced ankle fracture -Appreciate podiatry input Conservative management plan.  Nonweightbearing bilaterally for 4 to 6 weeks followed by  weightbearing as tolerated in cam boot PT and OT evaluation recommends SNF.  TOC working on placement.  Patient is unsure if she would go to rehab as she learned that she will have to sign her disability check and she has financial constraint.   Patient was scheduled to confirm with her son but now unable to get in touch with him.  I have called him twice but no luck  PTSD/Bipolar/Anxiety  -Continue Paxil, Seroquel, trazodone, Caplyta Requested psychiatry evaluation  Hypotension Her systolic blood pressure has been running 100-120 at home as per patient.  Resolved now.  I will stop midodrine  Acute urinary retention For last 24 to 48 hours we have been trying in and out and PureWick which she continues to have trouble urinating.  Will request Foley catheter   Osteoporosis  - on fosamax   DMII -Sliding scale insulin for now - A1c 5.5   CAD s/p MI  -CAD non-obstructive but per patient cath noted small caliber vessels  -continue ranxea   DJD of the back  -chronic back pain  -follows with pcp and pain management    Hypothyroidism -Continue synthroid   TIA hx    Obesity  -s/p gastric bypass    Hx if Seizure d/o  -on trileptal   DVT prophylaxis: Lovenox enoxaparin (LOVENOX) injection 40 mg Start: 09/10/22 2200     Code Status: Full code Family Communication: None at bedside.  I tried calling son twice but had no luck Disposition Plan: Medically stable, SNF when bed available versus home with home health   Consultants:  Podiatry Psychiatry Palliative care  Subjective:  Had episode of diaphoresis, restlessness, confusion and disorientation.  Objective: Vitals:  09/12/22 1650 09/12/22 2206 09/13/22 0015 09/13/22 0316  BP: 134/69 135/73 (!) 99/50 130/70  Pulse: 97 88 80 100  Resp: 20  18   Temp: 100.3 F (37.9 C)  99.5 F (37.5 C)   TempSrc:      SpO2: 96% 100% 97% 92%  Weight:      Height:        Intake/Output Summary (Last 24 hours) at 09/13/2022  0623 Last data filed at 09/12/2022 1914 Gross per 24 hour  Intake 720 ml  Output 650 ml  Net 70 ml    Filed Weights   09/09/22 2148 09/10/22 0812  Weight: 82.6 kg 82.5 kg    Examination:  General exam: Appears confused, disoriented, agitated Respiratory system: Clear to auscultation. Respiratory effort normal. Cardiovascular system: S1 & S2 heard, RRR. No JVD, murmurs, rubs, gallops or clicks. No pedal edema. Gastrointestinal system: Abdomen is nondistended, soft and nontender. No organomegaly or masses felt. Normal bowel sounds heard. Central nervous system: Awake and alert but confused. No focal neurological deficits. Extremities: Ankle splint in place Skin: No rashes, lesions or ulcers Psychiatry: Disoriented, restless and agitated    Data Reviewed: I have personally reviewed following labs and imaging studies  CBC: Recent Labs  Lab 09/09/22 2335 09/10/22 0515 09/12/22 0742 09/13/22 0446  WBC 8.4 13.4* 14.4* 8.5  HGB 11.7* 13.7 10.0* 8.8*  HCT 36.4 42.5 29.6* 27.2*  MCV 101.1* 100.5* 95.8 98.6  PLT 380 441* 286 286    Basic Metabolic Panel: Recent Labs  Lab 09/09/22 2335 09/10/22 0515 09/12/22 0742 09/13/22 0446  NA 135 136 143 142  K 4.1 4.0 4.5 3.5  CL 105 105 115* 113*  CO2 24 22 16* 23  GLUCOSE 90 86 124* 111*  BUN 17 16 15 12   CREATININE 0.83 0.80 0.83 0.83  CALCIUM 8.2* 9.0 8.6* 8.0*    GFR: Estimated Creatinine Clearance: 78.8 mL/min (by C-G formula based on SCr of 0.83 mg/dL). Liver Function Tests: Recent Labs  Lab 09/10/22 0515 09/13/22 0446  AST 43* 28  ALT 20 13  ALKPHOS 209* 131*  BILITOT 0.6 0.2*  PROT 7.4 5.5*  ALBUMIN 3.9 2.8*    Coagulation Profile: Recent Labs  Lab 09/10/22 0515  INR 1.0    HbA1C: No results for input(s): "HGBA1C" in the last 72 hours.  CBG: Recent Labs  Lab 09/12/22 0715 09/12/22 0844 09/12/22 1149 09/12/22 1649 09/12/22 2105  GLUCAP 138* 110* 148* 114* 128*    Thyroid Function  Tests: No results for input(s): "TSH", "T4TOTAL", "FREET4", "T3FREE", "THYROIDAB" in the last 72 hours.    Radiology Studies: ECHOCARDIOGRAM COMPLETE  Result Date: 09/12/2022    ECHOCARDIOGRAM REPORT   Patient Name:   Diana Quinn Date of Exam: 09/12/2022 Medical Rec #:  [id number]     Height:       63.0 in Accession #:    [id number]    Weight:       181.9 lb Date of Birth:  [birth year]     BSA:          1.857 m Patient Age:    56 years      BP:           134/96 mmHg Patient Gender: F             HR:           79 bpm. Exam Location:  [facility] Procedure: 2D Echo, Cardiac Doppler and Color Doppler Indications:     Dyspnea  R06.00  History:         Patient has no prior history of Echocardiogram examinations.                  Previous Myocardial Infarction, TIA; Risk Factors:Hypertension.  Sonographer:     Cristela Blue Referring Phys:  [id number] Amore Grater [facility] Diagnosing Phys: Julien Nordmann MD  Sonographer Comments: Technically challenging study due to limited acoustic windows and no apical window. Image acquisition challenging due to patient behavioral factors. IMPRESSIONS  1. Left ventricular ejection fraction, by estimation, is 60 to 65%. The left ventricle has normal function. The left ventricle has no regional wall motion abnormalities. Left ventricular diastolic parameters are indeterminate.  2. Right ventricular systolic function is normal. The right ventricular size is normal. Tricuspid regurgitation signal is inadequate for assessing PA pressure.  3. The mitral valve is normal in structure. No evidence of mitral valve regurgitation. No evidence of mitral stenosis.  4. The aortic valve is normal in structure. Aortic valve regurgitation is not visualized. No aortic stenosis is present.  5. The inferior vena cava is normal in size with greater than 50% respiratory variability, suggesting right atrial pressure of 3 mmHg. FINDINGS  Left Ventricle: Left ventricular ejection fraction, by estimation, is 60 to 65%. The left  ventricle has normal function. The left ventricle has no regional wall motion abnormalities. The left ventricular internal cavity size was normal in size. There is  no left ventricular hypertrophy. Left ventricular diastolic parameters are indeterminate. Right Ventricle: The right ventricular size is normal. No increase in right ventricular wall thickness. Right ventricular systolic function is normal. Tricuspid regurgitation signal is inadequate for assessing PA pressure. Left Atrium: Left atrial size was normal in size. Right Atrium: Right atrial size was normal in size. Pericardium: There is no evidence of pericardial effusion. Mitral Valve: The mitral valve is normal in structure. No evidence of mitral valve regurgitation. No evidence of mitral valve stenosis. Tricuspid Valve: The tricuspid valve is normal in structure. Tricuspid valve regurgitation is not demonstrated. No evidence of tricuspid stenosis. Aortic Valve: The aortic valve is normal in structure. Aortic valve regurgitation is not visualized. No aortic stenosis is present. Pulmonic Valve: The pulmonic valve was normal in structure. Pulmonic valve regurgitation is not visualized. No evidence of pulmonic stenosis. Aorta: The aortic root is normal in size and structure. Venous: The inferior vena cava is normal in size with greater than 50% respiratory variability, suggesting right atrial pressure of 3 mmHg. IAS/Shunts: No atrial level shunt detected by color flow Doppler.  LEFT VENTRICLE PLAX 2D LVIDd:         4.90 cm LVIDs:         3.50 cm LV PW:         1.10 cm LV IVS:        0.90 cm LVOT diam:     2.10 cm LVOT Area:     3.46 cm  LEFT ATRIUM         Index LA diam:    2.80 cm 1.51 cm/m   AORTA Ao Root diam: 2.80 cm TRICUSPID VALVE TR Peak grad:   10.5 mmHg TR Vmax:        162.00 cm/s  SHUNTS Systemic Diam: 2.10 cm Julien Nordmann MD Electronically signed by Julien Nordmann MD Signature Date/Time: 09/12/2022/12:26:20 PM    Final    CT Angio Chest  Pulmonary Embolism (PE) W or WO Contrast  Result Date: 09/12/2022 CLINICAL DATA:  Leg fracture.  Concern for  pulmonary embolism. EXAM: CT ANGIOGRAPHY CHEST WITH CONTRAST TECHNIQUE: Multidetector CT imaging of the chest was performed using the standard protocol during bolus administration of intravenous contrast. Multiplanar CT image reconstructions and MIPs were obtained to evaluate the vascular anatomy. RADIATION DOSE REDUCTION: This exam was performed according to the departmental dose-optimization program which includes automated exposure control, adjustment of the mA and/or kV according to patient size and/or use of iterative reconstruction technique. CONTRAST:  75mL OMNIPAQUE IOHEXOL 350 MG/ML SOLN COMPARISON:  None Available. FINDINGS: Cardiovascular: No filling defects within the pulmonary arteries to suggest acute pulmonary embolism. Mediastinum/Nodes: No axillary or supraclavicular adenopathy. No mediastinal or hilar adenopathy. No pericardial fluid. Esophagus normal. Lungs/Pleura: No pulmonary infarction. No pneumonia. No pleural fluid. No pneumothorax Upper Abdomen: Surgical staples in the gastric cardia. No acute findings Musculoskeletal: With chronic lumbar compression fracture. No acute findings Review of the MIP images confirms the above findings. IMPRESSION: 1. No evidence acute pulmonary embolism. 2. No acute pulmonary findings. 3. Chronic lumbar compression fracture. Electronically Signed   By: Genevive Bi M.D.   On: 09/12/2022 10:53   DG Chest Port 1 View  Result Date: 09/12/2022 CLINICAL DATA:  Dyspnea. EXAM: PORTABLE CHEST 1 VIEW COMPARISON:  12/25/2021 FINDINGS: Cardiac silhouette is normal in size. No mediastinal or hilar masses. Clear lungs.  No pleural effusion or pneumothorax. Skeletal structures are grossly intact. IMPRESSION: No active disease. Electronically Signed   By: Amie Portland M.D.   On: 09/12/2022 10:08   DG Abd 1 View  Result Date: 09/12/2022 CLINICAL DATA:   Abdominal pain, vomiting EXAM: ABDOMEN - 1 VIEW COMPARISON:  CT abdomen/pelvis dated 09/24/2018 FINDINGS: Nonobstructive bowel gas pattern. Scattered surgical clips throughout the abdomen/pelvis, with a gastric suture line in the left upper quadrant. Visualized osseous structures are within normal limits. Lung bases are clear. IMPRESSION: Nonobstructive bowel gas pattern. Electronically Signed   By: Charline Bills M.D.   On: 09/12/2022 01:41        Scheduled Meds:  atorvastatin  40 mg Oral Daily   enoxaparin (LOVENOX) injection  40 mg Subcutaneous Q24H   ibuprofen  600 mg Oral QID   insulin aspart  0-5 Units Subcutaneous QHS   insulin aspart  0-9 Units Subcutaneous TID WC   levothyroxine  112 mcg Oral Q0600   lumateperone tosylate  42 mg Oral Daily   oxcarbazepine  1,200 mg Oral BID   pantoprazole  40 mg Oral Daily   PARoxetine  40 mg Oral Daily   prazosin  2 mg Oral QHS   QUEtiapine  300 mg Oral QHS   ranolazine  1,000 mg Oral BID   tiZANidine  4 mg Oral TID   trazodone  300 mg Oral QHS   Continuous Infusions:     LOS: 3 days    Time spent: 35 minutes    Delfino Lovett, MD [facility] Pager [phone]  If 7PM-7AM, please contact night-coverage [email or web address] Password [facility] 09/13/2022, 6:23 AM

## 2022-09-13 NOTE — Progress Notes (Signed)
PT Cancellation Note  Patient Details Name: Kaytin Strommer MRN: [id number] DOB: [birth year]   Cancelled Treatment:     PT attempted 2 x this date but pt unwilling to participate. She endorses lack of sleep previous night and severe pain that just started to resolve. Pt was much more alert and oriented than previous date observed. " I promise I'll do my PT tomorrow after I catch up on sleep ."    Rushie Chestnut 09/13/2022, 2:09 PM

## 2022-09-13 NOTE — Consult Note (Signed)
[facility] Face-to-Face Psychiatry Consult   Reason for Consult: Alcohol withdrawal, history of depression. Referring Physician:    Delfino Lovett, MD   Patient Identification: Diana Quinn MRN:  [id number] Principal Diagnosis: Ankle fracture Diagnosis:  Principal Problem:   Ankle fracture Active Problems:   Closed fracture of left distal tibia   Total Time spent with patient: 15 minutes  Subjective:   Diana Quinn is a 57 y.o. female patient admitted with bilateral broken ankles.  Consult is for alcohol.  Her vital signs are stable.  She tells me that she only drinks once a week.  She does have a outpatient psychiatric provider who recently changed her from Risperdal to Caplyta.  She states that she has a history of depression but is doing pretty well recently.  She has trouble sleeping at night.  I put her on 5 mg of Valium before I saw her and she is a little sleepy today.  I told him to cut back on and continue with the Caplyta and discontinue the Risperdal.  She denies any suicidal ideation.  She denies any auditory or visual hallucinations..    Past Psychiatric History: History of depression currently being treated outpatient  Risk to Self:   Risk to Others:   Prior Inpatient Therapy:   Prior Outpatient Therapy:    Past Medical History:  Past Medical History:  Diagnosis Date   Anemia    Elevated lithium level 05/10/2014   Gastric bypass status for obesity    H/O unstable angina    Hx SBO 09/30/2014   Hypoglycemia    MI (myocardial infarction) (HCC)    Orthostatic hypotension    Osteoporosis    Sciatic nerve disease    Seizures (HCC)    Thyroid disease    TIA (transient ischemic attack) 04/22/2017    Past Surgical History:  Procedure Laterality Date   ABDOMINAL ADHESION SURGERY     ABDOMINAL HYSTERECTOMY     CHOLECYSTECTOMY     DILATION AND CURETTAGE OF UTERUS     ESOPHAGOGASTRODUODENOSCOPY     GASTRIC BYPASS     GASTRIC BYPASS OPEN     revision   GASTROSTOMY W/  FEEDING TUBE     HERNIA REPAIR     OOPHORECTOMY     SPLENECTOMY     TONSILLECTOMY     Family History:  Family History  Problem Relation Age of Onset   Cancer Mother    Hypertension Mother    COPD Mother    Asthma Sister    Psoriasis Sister    Epilepsy Brother    Family Psychiatric  History: Unremarkable Social History:  Social History   Substance and Sexual Activity  Alcohol Use Not Currently     Social History   Substance and Sexual Activity  Drug Use Never    Social History   Socioeconomic History   Marital status: Divorced    Spouse name: Not on file   Number of children: Not on file   Years of education: Not on file   Highest education level: Not on file  Occupational History   Not on file  Tobacco Use   Smoking status: Never   Smokeless tobacco: Never  Vaping Use   Vaping Use: Never used  Substance and Sexual Activity   Alcohol use: Not Currently   Drug use: Never   Sexual activity: Not on file  Other Topics Concern   Not on file  Social History Narrative   Not on file   Social Determinants of  Health   Financial Resource Strain: Not on file  Food Insecurity: No Food Insecurity (09/10/2022)   Hunger Vital Sign    Worried About Running Out of Food in the Last Year: Never true    Ran Out of Food in the Last Year: Never true  Transportation Needs: No Transportation Needs (09/10/2022)   PRAPARE - Administrator, Civil Service (Medical): No    Lack of Transportation (Non-Medical): No  Physical Activity: Not on file  Stress: Not on file  Social Connections: Not on file   Additional Social History:    Allergies:   Allergies  Allergen Reactions   Depakote [Divalproex Sodium] Shortness Of Breath   Pregabalin Other (See Comments), Rash and Shortness Of Breath    Other Reaction: falls; decreased coordination  Pt ts that she passes out when she takes this med.   Other Reaction: falls; decreased coordination    Pt ts that she passes out  when she takes this med.  Other Reaction: falls; decreased coordination, , Pt ts that she passes out when she takes this med.   Valproic Acid Other (See Comments) and Shortness Of Breath    Other Reaction: Other reaction  SOB   Levetiracetam Itching and Rash    Labs:  Results for orders placed or performed during the hospital encounter of 09/09/22 (from the past 48 hour(s))  Glucose, capillary     Status: Abnormal   Collection Time: 09/11/22 11:26 AM  Result Value Ref Range   Glucose-Capillary 182 (H) 70 - 99 mg/dL    Comment: Glucose reference range applies only to samples taken after fasting for at least 8 hours.  Glucose, capillary     Status: Abnormal   Collection Time: 09/11/22  4:44 PM  Result Value Ref Range   Glucose-Capillary 135 (H) 70 - 99 mg/dL    Comment: Glucose reference range applies only to samples taken after fasting for at least 8 hours.  Glucose, capillary     Status: Abnormal   Collection Time: 09/11/22  9:12 PM  Result Value Ref Range   Glucose-Capillary 128 (H) 70 - 99 mg/dL    Comment: Glucose reference range applies only to samples taken after fasting for at least 8 hours.   Comment 1 Notify RN   Glucose, capillary     Status: Abnormal   Collection Time: 09/12/22 12:47 AM  Result Value Ref Range   Glucose-Capillary 163 (H) 70 - 99 mg/dL    Comment: Glucose reference range applies only to samples taken after fasting for at least 8 hours.  Glucose, capillary     Status: Abnormal   Collection Time: 09/12/22  7:15 AM  Result Value Ref Range   Glucose-Capillary 138 (H) 70 - 99 mg/dL    Comment: Glucose reference range applies only to samples taken after fasting for at least 8 hours.  CBC     Status: Abnormal   Collection Time: 09/12/22  7:42 AM  Result Value Ref Range   WBC 14.4 (H) 4.0 - 10.5 K/uL   RBC 3.09 (L) 3.87 - 5.11 MIL/uL   Hemoglobin 10.0 (L) 12.0 - 15.0 g/dL   HCT 16.1 (L) 09.6 - 04.5 %   MCV 95.8 80.0 - 100.0 fL   MCH 32.4 26.0 - 34.0 pg    MCHC 33.8 30.0 - 36.0 g/dL   RDW 40.9 81.1 - 91.4 %   Platelets 286 150 - 400 K/uL   nRBC 0.0 0.0 - 0.2 %  Comment: Performed at [facility], [address] [place], [place] [address]  Basic metabolic panel     Status: Abnormal   Collection Time: 09/12/22  7:42 AM  Result Value Ref Range   Sodium 143 135 - 145 mmol/L   Potassium 4.5 3.5 - 5.1 mmol/L   Chloride 115 (H) 98 - 111 mmol/L   CO2 16 (L) 22 - 32 mmol/L   Glucose, Bld 124 (H) 70 - 99 mg/dL    Comment: Glucose reference range applies only to samples taken after fasting for at least 8 hours.   BUN 15 6 - 20 mg/dL   Creatinine, Ser 6.04 0.44 - 1.00 mg/dL   Calcium 8.6 (L) 8.9 - 10.3 mg/dL   GFR, Estimated >54 >09 mL/min    Comment: (NOTE) Calculated using the CKD-EPI Creatinine Equation (2021)    Anion gap 12 5 - 15    Comment: Performed at [facility], [address] [place], [place] [address]  Glucose, capillary     Status: Abnormal   Collection Time: 09/12/22  8:44 AM  Result Value Ref Range   Glucose-Capillary 110 (H) 70 - 99 mg/dL    Comment: Glucose reference range applies only to samples taken after fasting for at least 8 hours.  Blood gas, arterial     Status: Abnormal   Collection Time: 09/12/22  9:28 AM  Result Value Ref Range   pH, Arterial 7.36 7.35 - 7.45   pCO2 arterial 37 32 - 48 mmHg   pO2, Arterial 90 83 - 108 mmHg   Bicarbonate 20.9 20.0 - 28.0 mmol/L   Acid-base deficit 4.1 (H) 0.0 - 2.0 mmol/L   O2 Saturation 96.7 %   Patient temperature 37.0    Collection site LEFT RADIAL    Allens test (pass/fail) PASS PASS    Comment: Performed at [facility], [address] [place], [place] [address]  Glucose, capillary     Status: Abnormal   Collection Time: 09/12/22 11:49 AM  Result Value Ref Range   Glucose-Capillary 148 (H) 70 - 99 mg/dL    Comment: Glucose reference range applies only to samples taken after fasting for at least 8 hours.  Glucose, capillary      Status: Abnormal   Collection Time: 09/12/22  4:49 PM  Result Value Ref Range   Glucose-Capillary 114 (H) 70 - 99 mg/dL    Comment: Glucose reference range applies only to samples taken after fasting for at least 8 hours.  Urine Drug Screen, Qualitative ([facility] only)     Status: Abnormal   Collection Time: 09/12/22  5:04 PM  Result Value Ref Range   Tricyclic, Ur Screen POSITIVE (A) NONE DETECTED   Amphetamines, Ur Screen NONE DETECTED NONE DETECTED   MDMA (Ecstasy)Ur Screen NONE DETECTED NONE DETECTED   Cocaine Metabolite,Ur [place] NONE DETECTED NONE DETECTED   Opiate, Ur Screen POSITIVE (A) NONE DETECTED   Phencyclidine (PCP) Ur S NONE DETECTED NONE DETECTED   Cannabinoid [address], Ur [address] NONE DETECTED NONE DETECTED   Barbiturates, Ur Screen NONE DETECTED NONE DETECTED   Benzodiazepine, Ur Scrn NONE DETECTED NONE DETECTED   Methadone Scn, Ur NONE DETECTED NONE DETECTED    Comment: (NOTE) Tricyclics + metabolites, urine    Cutoff 1000 ng/mL Amphetamines + metabolites, urine  Cutoff 1000 ng/mL MDMA (Ecstasy), urine              Cutoff 500 ng/mL Cocaine Metabolite, urine  Cutoff 300 ng/mL Opiate + metabolites, urine        Cutoff 300 ng/mL Phencyclidine (PCP), urine         Cutoff 25 ng/mL Cannabinoid, urine                 Cutoff 50 ng/mL Barbiturates + metabolites, urine  Cutoff 200 ng/mL Benzodiazepine, urine              Cutoff 200 ng/mL Methadone, urine                   Cutoff 300 ng/mL  The urine drug screen provides only a preliminary, unconfirmed analytical test result and should not be used for non-medical purposes. Clinical consideration and professional judgment should be applied to any positive drug screen result due to possible interfering substances. A more specific alternate chemical method must be used in order to obtain a confirmed analytical result. Gas chromatography / mass spectrometry (GC/MS) is the preferred confirm atory method. Performed at [facility], [address] [place], [place] [address]   Glucose, capillary     Status: Abnormal   Collection Time: 09/12/22  9:05 PM  Result Value Ref Range   Glucose-Capillary 128 (H) 70 - 99 mg/dL    Comment: Glucose reference range applies only to samples taken after fasting for at least 8 hours.  CBC     Status: Abnormal   Collection Time: 09/13/22  4:46 AM  Result Value Ref Range   WBC 8.5 4.0 - 10.5 K/uL   RBC 2.76 (L) 3.87 - 5.11 MIL/uL   Hemoglobin 8.8 (L) 12.0 - 15.0 g/dL   HCT 19.1 (L) 47.8 - 29.5 %   MCV 98.6 80.0 - 100.0 fL   MCH 31.9 26.0 - 34.0 pg   MCHC 32.4 30.0 - 36.0 g/dL   RDW 62.1 30.8 - 65.7 %   Platelets 286 150 - 400 K/uL   nRBC 0.0 0.0 - 0.2 %    Comment: Performed at [facility], [address] [place], [place] [address]  Comprehensive metabolic panel     Status: Abnormal   Collection Time: 09/13/22  4:46 AM  Result Value Ref Range   Sodium 142 135 - 145 mmol/L   Potassium 3.5 3.5 - 5.1 mmol/L   Chloride 113 (H) 98 - 111 mmol/L   CO2 23 22 - 32 mmol/L   Glucose, Bld 111 (H) 70 - 99 mg/dL    Comment: Glucose reference range applies only to samples taken after fasting for at least 8 hours.   BUN 12 6 - 20 mg/dL   Creatinine, Ser 2.95 0.44 - 1.00 mg/dL   Calcium 8.0 (L) 8.9 - 10.3 mg/dL   Total Protein 5.5 (L) 6.5 - 8.1 g/dL   Albumin 2.8 (L) 3.5 - 5.0 g/dL   AST 28 15 - 41 U/L   ALT 13 0 - 44 U/L   Alkaline Phosphatase 131 (H) 38 - 126 U/L   Total Bilirubin 0.2 (L) 0.3 - 1.2 mg/dL   GFR, Estimated >28 >41 mL/min    Comment: (NOTE) Calculated using the CKD-EPI Creatinine Equation (2021)    Anion gap 6 5 - 15    Comment: Performed at [facility], [address] [place], [place] [address]  Glucose, capillary     Status: None   Collection Time: 09/13/22  8:12 AM  Result Value Ref Range   Glucose-Capillary 93 70 - 99 mg/dL    Comment: Glucose reference  range applies only to samples taken after fasting for at least 8  hours.    Current Facility-Administered Medications  Medication Dose Route Frequency Provider Last Rate Last Admin   albuterol (PROVENTIL) (2.5 MG/3ML) 0.083% nebulizer solution 2.5 mg  2.5 mg Nebulization Q2H PRN Lurline Del, MD       atorvastatin (LIPITOR) tablet 40 mg  40 mg Oral Daily Skip Mayer A, MD   40 mg at 09/13/22 0856   diazepam (VALIUM) tablet 2 mg  2 mg Oral BID Sarina Ill, DO       enoxaparin (LOVENOX) injection 40 mg  40 mg Subcutaneous Q24H Delfino Lovett, MD   40 mg at 09/12/22 2208   hydrOXYzine (ATARAX) tablet 25 mg  25 mg Oral Q6H PRN Sarina Ill, DO       ibuprofen (ADVIL) tablet 600 mg  600 mg Oral QID Delfino Lovett, MD   600 mg at 09/13/22 0850   insulin aspart (novoLOG) injection 0-5 Units  0-5 Units Subcutaneous QHS Delfino Lovett, MD       insulin aspart (novoLOG) injection 0-9 Units  0-9 Units Subcutaneous TID WC Delfino Lovett, MD   1 Units at 09/12/22 1302   levothyroxine (SYNTHROID) tablet 112 mcg  112 mcg Oral Q0600 Lurline Del, MD   112 mcg at 09/13/22 7829   loperamide (IMODIUM) capsule 2-4 mg  2-4 mg Oral PRN Sarina Ill, DO       LORazepam (ATIVAN) tablet 1 mg  1 mg Oral Q6H PRN Sarina Ill, DO       lumateperone tosylate (CAPLYTA) capsule 42 mg  42 mg Oral Daily Ian Castagna Edward, DO       melatonin tablet 5 mg  5 mg Oral QHS PRN Lurline Del, MD       multivitamin with minerals tablet 1 tablet  1 tablet Oral Daily Sarina Ill, DO   1 tablet at 09/13/22 0849   ondansetron (ZOFRAN) tablet 4 mg  4 mg Oral Q6H PRN Lurline Del, MD       Or   ondansetron [facility]) injection 4 mg  4 mg Intravenous Q6H PRN Skip Mayer A, MD   4 mg at 09/11/22 2352   ondansetron (ZOFRAN-ODT) disintegrating tablet 4 mg  4 mg Oral Q6H PRN Sarina Ill, DO       Oxcarbazepine (TRILEPTAL) tablet 600 mg  600 mg Oral BID Delfino Lovett, MD       pantoprazole (PROTONIX) EC tablet 40  mg  40 mg Oral Daily Skip Mayer A, MD   40 mg at 09/13/22 0849   PARoxetine (PAXIL) tablet 40 mg  40 mg Oral Daily Skip Mayer A, MD   40 mg at 09/13/22 0849   prazosin (MINIPRESS) capsule 2 mg  2 mg Oral QHS Skip Mayer A, MD   2 mg at 09/12/22 2208   QUEtiapine (SEROQUEL) tablet 300 mg  300 mg Oral QHS Skip Mayer A, MD   300 mg at 09/12/22 2209   ranolazine (RANEXA) 12 hr tablet 1,000 mg  1,000 mg Oral BID Skip Mayer A, MD   1,000 mg at 09/13/22 0850   [START ON 09/14/2022] thiamine (VITAMIN B1) tablet 100 mg  100 mg Oral Daily Sarina Ill, DO       tiZANidine (ZANAFLEX) tablet 4 mg  4 mg Oral TID Lurline Del, MD   4 mg at 09/13/22 0850   traZODone (DESYREL) tablet 300 mg  300  mg Oral QHS Lurline Del, MD   300 mg at 09/12/22 2209    Musculoskeletal: Strength & Muscle Tone: within normal limits Gait & Station: normal Patient leans: N/A            Psychiatric Specialty Exam:  Presentation  General Appearance: No data recorded Eye Contact:No data recorded Speech:No data recorded Speech Volume:No data recorded Handedness:No data recorded  Mood and Affect  Mood:No data recorded Affect:No data recorded  Thought Process  Thought Processes:No data recorded Descriptions of Associations:No data recorded Orientation:No data recorded Thought Content:No data recorded History of Schizophrenia/Schizoaffective disorder:No data recorded Duration of Psychotic Symptoms:No data recorded Hallucinations:No data recorded Ideas of Reference:No data recorded Suicidal Thoughts:No data recorded Homicidal Thoughts:No data recorded  Sensorium  Memory:No data recorded Judgment:No data recorded Insight:No data recorded  Executive Functions  Concentration:No data recorded Attention Span:No data recorded Recall:No data recorded Fund of Knowledge:No data recorded Language:No data recorded  Psychomotor Activity  Psychomotor  Activity:No data recorded  Assets  Assets:No data recorded  Sleep  Sleep:No data recorded  Physical Exam: Physical Exam Vitals and nursing note reviewed.  Constitutional:      Appearance: Normal appearance. She is normal weight.  Neurological:     General: No focal deficit present.     Mental Status: She is alert and oriented to person, place, and time.  Psychiatric:        Attention and Perception: Attention and perception normal.        Mood and Affect: Mood and affect normal.        Speech: Speech normal.        Behavior: Behavior normal. Behavior is cooperative.        Thought Content: Thought content normal.        Cognition and Memory: Cognition and memory normal.        Judgment: Judgment normal.    Review of Systems  Constitutional: Negative.   HENT: Negative.    Eyes: Negative.   Respiratory: Negative.    Cardiovascular: Negative.   Gastrointestinal: Negative.   Genitourinary: Negative.   Musculoskeletal: Negative.   Skin: Negative.   Neurological: Negative.   Endo/Heme/Allergies: Negative.   Psychiatric/Behavioral:  Positive for depression.    Blood pressure 119/81, pulse 85, temperature 97.8 F (36.6 C), resp. rate 17, height 5\' 3"  (1.6 m), weight 82.5 kg, SpO2 96 %. Body mass index is 32.22 kg/m.  Treatment Plan Summary: Daily contact with patient to assess and evaluate symptoms and progress in treatment, Medication management, and Plan continue with Caplyta and Seroquel.  Decrease Valium to 2 mg twice a day.  Disposition: No evidence of imminent risk to self or others at present.    Sarina Ill, DO 09/13/2022 10:41 AM

## 2022-09-14 DIAGNOSIS — Z87898 Personal history of other specified conditions: Secondary | ICD-10-CM | POA: Diagnosis not present

## 2022-09-14 DIAGNOSIS — Z7189 Other specified counseling: Secondary | ICD-10-CM | POA: Diagnosis not present

## 2022-09-14 DIAGNOSIS — S82302A Unspecified fracture of lower end of left tibia, initial encounter for closed fracture: Secondary | ICD-10-CM | POA: Diagnosis not present

## 2022-09-14 DIAGNOSIS — F331 Major depressive disorder, recurrent, moderate: Secondary | ICD-10-CM

## 2022-09-14 DIAGNOSIS — E039 Hypothyroidism, unspecified: Secondary | ICD-10-CM | POA: Diagnosis not present

## 2022-09-14 DIAGNOSIS — S82891A Other fracture of right lower leg, initial encounter for closed fracture: Secondary | ICD-10-CM | POA: Diagnosis not present

## 2022-09-14 LAB — GLUCOSE, CAPILLARY
Glucose-Capillary: 86 mg/dL (ref 70–99)
Glucose-Capillary: 103 mg/dL — ABNORMAL HIGH (ref 70–99)
Glucose-Capillary: 102 mg/dL — ABNORMAL HIGH (ref 70–99)
Glucose-Capillary: 164 mg/dL — ABNORMAL HIGH (ref 70–99)

## 2022-09-14 NOTE — Progress Notes (Signed)
PROGRESS NOTE    Diana Quinn  UUV:[id number] DOB: Oct 11, 1965 DOA: 09/09/2022 PCP: Orson Eva, NP (Confirm with patient/family/NH records and if not entered, this HAS to be entered at [facility] point of entry. "No PCP" if truly none.)   Brief Narrative: (Start on day 1 of progress note - keep it brief and live) 57 y.o. female with medical history significant of  PTSD/Bipolar/Schizophrenia, anxiety,Osteoporosis, DMII with history of hypogylcemia, CAD s/p MI with non-obstructive CAD on Cath per patient, Seizure d/o djd for the back , TIA , hx of gastric bypass for treatment of obesity.  She is admitted for nondisplaced medial malleolus and distal lateral calcaneus fracture s/p mechanical fall  7/3: Podiatry recommends conservative management 7/4: Patient remains hypotensive but not symptomatic.  Still debating if she would consider rehab due to financial constraints 7/5: Signs and symptoms of acute drug intoxication/withdrawal this morning 7/6: Psych consult-decreased Valium to 2 mg twice daily 7/7: Valium discontinued per psych  Assessment & Plan:   Principal Problem:   Ankle fracture Active Problems:   Closed fracture of left distal tibia   Major depressive disorder, recurrent episode, moderate (HCC)  Acute metabolic encephalopathy CT chest negative for PE, chest x-ray negative.  ABG within normal limit Patient was found to be confused, disoriented clammy and restless.  She was very tremulous and behavior concerning for drug intoxication/withdrawal Nursing found a bottle of Percocet, Zanaflex, Seroquel, Advil in her purse.  Unsure if she over ingested any of this or something else.  She did receive 1 dose of Compazine and Dilaudid.  Difficult to know if she reacted to this Discontinued safety sitter She is back to her normal baseline mental status.  Appreciate psych input.  Discontinued Valium.  Continue Caplyta and Seroquel  Left distal tibial fracture/nondisplaced and right  nondisplaced ankle fracture -Appreciate podiatry input Conservative management plan.  Nonweightbearing bilaterally for 4 to 6 weeks followed by weightbearing as tolerated in cam boot PT and OT evaluation recommends SNF.  TOC working on placement.    PTSD/Bipolar/Anxiety  -Continue Paxil, Seroquel, trazodone, Caplyta Seen by psychiatry.  Continue Caplyta and Seroquel.  Valium discontinued today  Hypotension Her systolic blood pressure has been running 100-120 at home as per patient.  Resolved now.  Required midodrine for 48 hours and now stopped  Acute urinary retention  Foley catheter placed on 7/5, will have voiding trial and removal of Foley if possible   Osteoporosis  - on fosamax   DMII -Sliding scale insulin for now - A1c 5.5   CAD s/p MI  -CAD non-obstructive but per patient cath noted small caliber vessels  -continue ranxea   DJD of the back  -chronic back pain  -follows with pcp and pain management    Hypothyroidism -Continue synthroid   TIA hx    Obesity  -s/p gastric bypass    Hx if Seizure d/o  -on trileptal 600 mg twice daily.     DVT prophylaxis: Lovenox enoxaparin (LOVENOX) injection 40 mg Start: 09/10/22 2200     Code Status: Full code Family Communication: Son was updated on 7/5 over phone. Disposition Plan: Medically stable, SNF when bed available    Consultants:  Podiatry Psychiatry Palliative care  Subjective:  No new complaints.  Feeling much better  Objective: Vitals:   09/13/22 1706 09/13/22 2119 09/14/22 0500 09/14/22 0717  BP: 121/77 135/80  129/70  Pulse: 87 78  76  Resp: 18 20  14   Temp: 98.8 F (37.1 C) 98 F (36.7 C)  98.3 F (36.8 C)  TempSrc:      SpO2: 98% 97%  100%  Weight:   92.6 kg   Height:        Intake/Output Summary (Last 24 hours) at 09/14/2022 1419 Last data filed at 09/13/2022 1500 Gross per 24 hour  Intake 0 ml  Output 50 ml  Net -50 ml    Filed Weights   09/09/22 2148 09/10/22 0812 09/14/22  0500  Weight: 82.6 kg 82.5 kg 92.6 kg    Examination:  General exam: 57 year old female lying in bed comfortably without any acute distress Respiratory system: Clear to auscultation. Respiratory effort normal. Cardiovascular system: S1 & S2 heard, RRR. No JVD, murmurs, rubs, gallops or clicks. No pedal edema. Gastrointestinal system: Abdomen is nondistended, soft and nontender. No organomegaly or masses felt. Normal bowel sounds heard. Central nervous system: Awake and alert. No focal neurological deficits. Extremities: Ankle splint in place Skin: No rashes, lesions or ulcers Psychiatry: Normal mood and affect    Data Reviewed: I have personally reviewed following labs and imaging studies  CBC: Recent Labs  Lab 09/09/22 2335 09/10/22 0515 09/12/22 0742 09/13/22 0446  WBC 8.4 13.4* 14.4* 8.5  HGB 11.7* 13.7 10.0* 8.8*  HCT 36.4 42.5 29.6* 27.2*  MCV 101.1* 100.5* 95.8 98.6  PLT 380 441* 286 286    Basic Metabolic Panel: Recent Labs  Lab 09/09/22 2335 09/10/22 0515 09/12/22 0742 09/13/22 0446  NA 135 136 143 142  K 4.1 4.0 4.5 3.5  CL 105 105 115* 113*  CO2 24 22 16* 23  GLUCOSE 90 86 124* 111*  BUN 17 16 15 12   CREATININE 0.83 0.80 0.83 0.83  CALCIUM 8.2* 9.0 8.6* 8.0*    GFR: Estimated Creatinine Clearance: 83.8 mL/min (by C-G formula based on SCr of 0.83 mg/dL). Liver Function Tests: Recent Labs  Lab 09/10/22 0515 09/13/22 0446  AST 43* 28  ALT 20 13  ALKPHOS 209* 131*  BILITOT 0.6 0.2*  PROT 7.4 5.5*  ALBUMIN 3.9 2.8*    Coagulation Profile: Recent Labs  Lab 09/10/22 0515  INR 1.0    HbA1C: No results for input(s): "HGBA1C" in the last 72 hours.  CBG: Recent Labs  Lab 09/13/22 1155 09/13/22 1702 09/13/22 2118 09/14/22 0716 09/14/22 1129  GLUCAP 142* 129* 149* 86 103*    Thyroid Function Tests: No results for input(s): "TSH", "T4TOTAL", "FREET4", "T3FREE", "THYROIDAB" in the last 72 hours.    Radiology Studies: No results  found.      Scheduled Meds:  atorvastatin  40 mg Oral Daily   Chlorhexidine Gluconate Cloth  6 each Topical Daily   enoxaparin (LOVENOX) injection  40 mg Subcutaneous Q24H   ibuprofen  600 mg Oral QID   insulin aspart  0-5 Units Subcutaneous QHS   insulin aspart  0-9 Units Subcutaneous TID WC   levothyroxine  112 mcg Oral Q0600   lumateperone tosylate  42 mg Oral Daily   multivitamin with minerals  1 tablet Oral Daily   oxcarbazepine  600 mg Oral BID   pantoprazole  40 mg Oral Daily   PARoxetine  40 mg Oral Daily   prazosin  2 mg Oral QHS   QUEtiapine  300 mg Oral QHS   ranolazine  1,000 mg Oral BID   thiamine  100 mg Oral Daily   tiZANidine  4 mg Oral TID   trazodone  300 mg Oral QHS       LOS: 4 days    Time spent:  35 minutes    Delfino Lovett, MD [facility] Pager [phone]  If 7PM-7AM, please contact night-coverage [email or web address] Password [email or web address] 09/14/2022, 2:19 PM

## 2022-09-14 NOTE — Final Consult Note (Signed)
Consultant Final Sign-Off Note    Assessment/Final recommendations  Diana Quinn is a 57 y.o. female followed by me for Alcohol abuse and depression.  She says that she is feeling pretty good on her current psychiatric medications.  She says that she only drinks once a week so I think we can stop the Valium.  She has an outpatient psychiatric provider has her on Caplyta.   Wound care (if applicable):    Diet at discharge: per primary team   Activity at discharge: per primary team   Follow-up appointment:     Pending results:  Unresulted Labs (From admission, onward)    None        Medication recommendations:   Other recommendations:    Thank you for allowing [place] to participate in the care of your patient!  Please consult [place] again if you have further needs for your patient.  Diana Quinn 09/14/2022 1:19 PM    Subjective     Objective  Vital signs in last 24 hours: Temp:  [98 F (36.7 C)-98.8 F (37.1 C)] 98.3 F (36.8 C) (07/07 0717) Pulse Rate:  [76-87] 76 (07/07 0717) Resp:  [14-20] 14 (07/07 0717) BP: (121-135)/(70-80) 129/70 (07/07 0717) SpO2:  [97 %-100 %] 100 % (07/07 0717) Weight:  [92.6 kg] 92.6 kg (07/07 0500)  General: I will discontinue her Valium.   Pertinent labs and Studies: Recent Labs    09/12/22 0742 09/13/22 0446  WBC 14.4* 8.5  HGB 10.0* 8.8*  HCT 29.6* 27.2*   BMET Recent Labs    09/12/22 0742 09/13/22 0446  NA 143 142  K 4.5 3.5  CL 115* 113*  CO2 16* 23  GLUCOSE 124* 111*  BUN 15 12  CREATININE 0.83 0.83  CALCIUM 8.6* 8.0*   No results for input(s): "LABURIN" in the last 72 hours. Results for orders placed or performed during the hospital encounter of 12/25/21  Urine Culture     Status: Abnormal   Collection Time: 12/25/21  2:35 PM   Specimen: Urine, Clean Catch  Result Value Ref Range Status   Specimen Description   Final    URINE, CLEAN CATCH Performed at [facility], [address] [place], [place] [address]    Special Requests   Final    NONE Performed at [facility], [address] [place], [place] [address]    Culture >=100,000 COLONIES/mL ESCHERICHIA COLI (A)  Final   Report Status 12/27/2021 FINAL  Final   Organism ID, Bacteria ESCHERICHIA COLI (A)  Final      Susceptibility   Escherichia coli - MIC*    AMPICILLIN >=32 RESISTANT Resistant     CEFAZOLIN >=64 RESISTANT Resistant     CEFEPIME <=0.12 SENSITIVE Sensitive     CEFTRIAXONE 8 RESISTANT Resistant     CIPROFLOXACIN <=0.25 SENSITIVE Sensitive     GENTAMICIN <=1 SENSITIVE Sensitive     IMIPENEM <=0.25 SENSITIVE Sensitive     NITROFURANTOIN <=16 SENSITIVE Sensitive     TRIMETH/SULFA <=20 SENSITIVE Sensitive     AMPICILLIN/SULBACTAM >=32 RESISTANT Resistant     PIP/TAZO 16 SENSITIVE Sensitive     * >=100,000 COLONIES/mL ESCHERICHIA COLI    Imaging: No results found.

## 2022-09-14 NOTE — Progress Notes (Signed)
Physical Therapy Treatment Patient Details Name: Diana Quinn MRN: [id number] DOB: 02/13/1966 Today's Date: 09/14/2022   History of Present Illness Pt is a 57 y/o F admitted on 09/09/22 after presenting with c/o fall, rolling both ankles inward & hearing a pop. Pt found to have L distal tibial fx nondisplaced & R nondisplaced ankle fx. Podiatry currently recommending conservative care & NWB BLE x 4-6 weeks. PMH: PTSD, bipolar schizophrenia, anxiety, osteoporosis, DM2, CAD s/p MI with non obstructive CAD on cath, seizure disorder, TIA, DJD    PT Comments  Pt ready for session.  Feeling better with pain under better control.  HEP education and review for supine ex.  Returned demo x 10. She is able to get to EOB on her own with rails and laterally scoot left/right along bed x 3 each direction.  No LOB with good clearance today with activity.  Seated AROM BLE.  She is fatigued with activity and opts to return to supine after session which she is able to do on her own.  Good progress with mobility today.    Assistance Recommended at Discharge Frequent or constant Supervision/Assistance  If plan is discharge home, recommend the following:  Can travel by private vehicle    A lot of help with walking and/or transfers;A lot of help with bathing/dressing/bathroom;Assist for transportation;Assistance with cooking/housework;Help with stairs or ramp for entrance      Equipment Recommendations  Hospital bed;Wheelchair (measurements PT);BSC/3in1 (drop arm BSC)    Recommendations for Other Services       Precautions / Restrictions Precautions Precautions: Fall Restrictions Weight Bearing Restrictions: Yes RLE Weight Bearing: Non weight bearing LLE Weight Bearing: Non weight bearing     Mobility  Bed Mobility   Bed Mobility: Supine to Sit, Sit to Supine     Supine to sit: Modified independent (Device/Increase time) Sit to supine: Modified independent (Device/Increase time)     Patient  Response: Cooperative  Transfers Overall transfer level: Needs assistance Equipment used: None              Lateral/Scoot Transfers: Supervision General transfer comment: left and right on bed x 3 each    Ambulation/Gait               General Gait Details: b/l NWBing, not appropriate   Stairs             Wheelchair Mobility     Tilt Bed Tilt Bed Patient Response: Cooperative  Modified Rankin (Stroke Patients Only)       Balance Overall balance assessment: Needs assistance Sitting-balance support: Feet unsupported, Bilateral upper extremity supported Sitting balance-Leahy Scale: Good Sitting balance - Comments: no LOB"s with scooting today or assist needed                                    Cognition Arousal/Alertness: Awake/alert Behavior During Therapy: WFL for tasks assessed/performed Overall Cognitive Status: Within Functional Limits for tasks assessed                                          Exercises Other Exercises Other Exercises: BLE HEP education and demo for supine AROM, seated AROM    General Comments        Pertinent Vitals/Pain Pain Assessment Pain Assessment: Faces Faces Pain Scale: Hurts little more Pain Location: BLE (  L>R) Pain Descriptors / Indicators: Grimacing, Guarding, Discomfort Pain Intervention(s): Limited activity within patient's tolerance, Monitored during session, Premedicated before session, Repositioned    Home Living                          Prior Function            PT Goals (current goals can now be found in the care plan section) Progress towards PT goals: Progressing toward goals    Frequency    7X/week      PT Plan Current plan remains appropriate    Co-evaluation              AM-PAC PT "6 Clicks" Mobility   Outcome Measure  Help needed turning from your back to your side while in a flat bed without using bedrails?: A Little Help  needed moving from lying on your back to sitting on the side of a flat bed without using bedrails?: None Help needed moving to and from a bed to a chair (including a wheelchair)?: A Lot Help needed standing up from a chair using your arms (e.g., wheelchair or bedside chair)?: Total Help needed to walk in hospital room?: Total Help needed climbing 3-5 steps with a railing? : Total 6 Click Score: 12    End of Session   Activity Tolerance: Patient tolerated treatment well Patient left: in bed;with call bell/phone within reach;with bed alarm set Nurse Communication: Mobility status PT Visit Diagnosis: Muscle weakness (generalized) (M62.81);History of falling (Z91.81);Other abnormalities of gait and mobility (R26.89);Pain Pain - part of body: Ankle and joints of foot     Time: 1610-9604 PT Time Calculation (min) (ACUTE ONLY): 27 min  Charges:    $Therapeutic Exercise: 8-22 mins $Therapeutic Activity: 8-22 mins PT General Charges $$ ACUTE PT VISIT: 1 Visit                   Danielle Dess, PTA 09/14/22, 11:01 AM

## 2022-09-14 NOTE — Plan of Care (Signed)

## 2022-09-14 NOTE — Progress Notes (Signed)
Daily Progress Note   Patient Name: Diana Quinn       Date: 09/14/2022 DOB: 04-03-1965  Age: 57 y.o. MRN#: [id number] Attending Physician: Delfino Lovett, MD Primary Care Physician: Orson Eva, NP Admit Date: 09/09/2022  Reason for Consultation/Follow-up: Establishing goals of care  HPI/Brief Hospital Review: 57 y.o. female  with past medical history of PTSD, bipolar disorder, schizophrenia, anxiety, osteoporosis, T2DM with history of hypoglycemia, CAD s/p MI, seizure disorder, DJD, TIA, multiple previous falls with fractures and history of gastric bypass surgery admitted from home on 09/09/2022 s/p mechanical fall.   ED course Found to have bilateral ankle fractures   Podiatry consulted-no indication for surgical intervention, conservative management, remain NWB 6-8 weeks followed by [facility]AT Would likely benefit from SNF due to NWB status   From chart review-Ms. Nard hesitant to d/c to SNF due to financial concerns   From chart review-AMS AM of 7/5, prescription bottles including Percocet, Zanaflex, ibuprofen and Seroquel found at bedside-sent to pharmacy for safety    Palliative medicine was consulted for assisting with goals of care conversations  Subjective: Extensive chart review has been completed prior to meeting patient including labs, vital signs, imaging, progress notes, orders, and available advanced directive documents from current and previous encounters.    Visited with Diana Quinn at her bedside. Dangling on edge of bed with therapy services-hopeful to work towards using [facility] to have Foley removed as she voices discomfort.  Mentation continues to improve, assuming has returned to baseline as she is A/O x4. She shares she is highly motivated to participate and continue therapy  in order to return home to her family. Goals remain clear. Thankful of providing AD documents for completion.  Reports sleeping well overnight and pain well controlled.  Answered and addressed all questions and concerns. Shared provider from PMT to be available if needed and to reach out to PMT phone as needed.  Thank you for allowing the Palliative Medicine Team to assist in the care of this patient.  Total time:  25 minutes  Time spent includes: Detailed review of medical records (labs, imaging, vital signs), medically appropriate exam (mental status, respiratory, cardiac, skin), discussed with treatment team, counseling and educating patient, family and staff, documenting clinical information, medication management and coordination of care.  Leeanne Deed, DNP, AGNP-C Palliative Medicine  Please contact Palliative Medicine Team phone at [phone] for questions and concerns.

## 2022-09-15 ENCOUNTER — Inpatient Hospital Stay: Payer: MEDICAID

## 2022-09-15 DIAGNOSIS — G9341 Metabolic encephalopathy: Secondary | ICD-10-CM | POA: Diagnosis not present

## 2022-09-15 DIAGNOSIS — F331 Major depressive disorder, recurrent, moderate: Secondary | ICD-10-CM | POA: Diagnosis not present

## 2022-09-15 DIAGNOSIS — S82891A Other fracture of right lower leg, initial encounter for closed fracture: Secondary | ICD-10-CM | POA: Diagnosis not present

## 2022-09-15 LAB — URINALYSIS, COMPLETE (UACMP) WITH MICROSCOPIC
Bilirubin Urine: NEGATIVE
Glucose, UA: NEGATIVE mg/dL
Ketones, ur: 5 mg/dL — AB
Nitrite: POSITIVE — AB
Protein, ur: 100 mg/dL — AB
RBC / HPF: >50 RBC/hpf (ref 0–5)
Specific Gravity, Urine: 1.032 — ABNORMAL HIGH (ref 1.005–1.030)
WBC, UA: >50 WBC/hpf (ref 0–5)
pH: 5 (ref 5.0–8.0)

## 2022-09-15 LAB — GLUCOSE, CAPILLARY
Glucose-Capillary: 81 mg/dL (ref 70–99)
Glucose-Capillary: 98 mg/dL (ref 70–99)
Glucose-Capillary: 74 mg/dL (ref 70–99)
Glucose-Capillary: 102 mg/dL — ABNORMAL HIGH (ref 70–99)

## 2022-09-15 MED ORDER — IBUPROFEN 400 MG PO TABS
600.0000 mg | ORAL_TABLET | Freq: Four times a day (QID) | ORAL | Status: DC | PRN
  Administered 2022-09-15 – 2022-09-23 (×8): 600 mg via ORAL
  Filled 2022-09-15 (×10): qty 2

## 2022-09-15 MED ORDER — CEPHALEXIN 500 MG PO CAPS
500.0000 mg | ORAL_CAPSULE | Freq: Two times a day (BID) | ORAL | Status: DC
  Administered 2022-09-15 – 2022-09-18 (×6): 500 mg via ORAL
  Filled 2022-09-15 (×6): qty 1

## 2022-09-15 NOTE — Progress Notes (Signed)
PROGRESS NOTE    Diana Quinn  JXB:[id number] DOB: [birth year] DOA: 09/09/2022 PCP: Orson Eva, NP (Confirm with patient/family/NH records and if not entered, this HAS to be entered at [facility] point of entry. "No PCP" if truly none.)   Brief Narrative: (Start on day 1 of progress note - keep it brief and live) 57 y.o. female with medical history significant of  PTSD/Bipolar/Schizophrenia, anxiety,Osteoporosis, DMII with history of hypogylcemia, CAD s/p MI with non-obstructive CAD on Cath per patient, Seizure d/o djd for the back , TIA , hx of gastric bypass for treatment of obesity.  She is admitted for nondisplaced medial malleolus and distal lateral calcaneus fracture s/p mechanical fall  7/3: Podiatry recommends conservative management 7/4: Patient remains hypotensive but not symptomatic.  Still debating if she would consider rehab due to financial constraints 7/5: Signs and symptoms of acute drug intoxication/withdrawal this morning 7/6: Psych consult-decreased Valium to 2 mg twice daily 7/7: Valium discontinued per psych 7/8: Changed ibuprofen from scheduled to as needed, requested podiatry reevaluation of her ankle and dressing  Assessment & Plan:   Principal Problem:   Ankle fracture Active Problems:   Closed fracture of left distal tibia   Major depressive disorder, recurrent episode, moderate (HCC)  Acute metabolic encephalopathy CT chest negative for PE, chest x-ray negative.  ABG within normal limit Patient was found to be confused, disoriented clammy and restless.  She was very tremulous and behavior concerning for drug intoxication/withdrawal on 7/5 Nursing found a bottle of Percocet, Zanaflex, Seroquel, Advil in her purse.  Unsure if she over ingested any of this or something else.  She did receive 1 dose of Compazine and Dilaudid.  Difficult to know if she reacted to this Discontinued safety sitter She is back to her normal baseline mental status in few hours on 7/5.   Appreciate psych input.  Discontinued Valium.  Continue Caplyta and Seroquel  Left distal tibial fracture/nondisplaced and right nondisplaced ankle fracture -Appreciate podiatry input Conservative management plan.  Nonweightbearing bilaterally for 4 to 6 weeks followed by weightbearing as tolerated in cam boot PT and OT evaluation recommends SNF.  TOC working on placement.    PTSD/Bipolar/Anxiety  -Continue Paxil, Seroquel, trazodone, Caplyta Seen by psychiatry.  Continue Caplyta and Seroquel.  Valium discontinued   Hypotension Transient and resolved now  Acute urinary retention  Foley catheter placed on 7/5, removed on 7/7   Osteoporosis  - on fosamax   DMII -Sliding scale insulin for now - A1c 5.5   CAD s/p MI  -CAD non-obstructive but per patient cath noted small caliber vessels  -continue ranxea   DJD of the back  -chronic back pain  -follows with pcp and pain management    Hypothyroidism -Continue synthroid   TIA hx    Obesity  -s/p gastric bypass    Hx if Seizure d/o  -on trileptal 600 mg twice daily.     DVT prophylaxis: Lovenox enoxaparin (LOVENOX) injection 40 mg Start: 09/10/22 2200     Code Status: Full code Family Communication: Son was updated on 7/5 over phone. Disposition Plan: Medically stable, SNF when bed available    Consultants:  Podiatry Psychiatry Palliative care  Subjective:  Requesting her ankle dressing to be reevaluated as having pain and itching at the site  Objective: Vitals:   09/14/22 1513 09/15/22 0119 09/15/22 0500 09/15/22 0810  BP: 129/74 127/74  135/83  Pulse: 70 80  76  Resp: 14 16  16   Temp: 98.4 F (36.9 C) 98  F (36.7 C)  98.1 F (36.7 C)  TempSrc:      SpO2: 95% 95%  94%  Weight:   92.4 kg   Height:        Intake/Output Summary (Last 24 hours) at 09/15/2022 1303 Last data filed at 09/15/2022 1032 Gross per 24 hour  Intake 420 ml  Output 375 ml  Net 45 ml    Filed Weights   09/10/22 0812 09/14/22  0500 09/15/22 0500  Weight: 82.5 kg 92.6 kg 92.4 kg    Examination:  General exam: 57 year old female lying in bed comfortably without any acute distress Respiratory system: Clear to auscultation. Respiratory effort normal. Cardiovascular system: S1 & S2 heard, RRR. No JVD, murmurs, rubs, gallops or clicks. No pedal edema. Gastrointestinal system: Abdomen is nondistended, soft and nontender. No organomegaly or masses felt. Normal bowel sounds heard. Central nervous system: Awake and alert. No focal neurological deficits. Extremities: Ace wrapped over bilateral ankle Skin: No rashes, lesions or ulcers Psychiatry: Normal mood and affect    Data Reviewed: I have personally reviewed following labs and imaging studies  CBC: Recent Labs  Lab 09/09/22 2335 09/10/22 0515 09/12/22 0742 09/13/22 0446  WBC 8.4 13.4* 14.4* 8.5  HGB 11.7* 13.7 10.0* 8.8*  HCT 36.4 42.5 29.6* 27.2*  MCV 101.1* 100.5* 95.8 98.6  PLT 380 441* 286 286    Basic Metabolic Panel: Recent Labs  Lab 09/09/22 2335 09/10/22 0515 09/12/22 0742 09/13/22 0446  NA 135 136 143 142  K 4.1 4.0 4.5 3.5  CL 105 105 115* 113*  CO2 24 22 16* 23  GLUCOSE 90 86 124* 111*  BUN 17 16 15 12   CREATININE 0.83 0.80 0.83 0.83  CALCIUM 8.2* 9.0 8.6* 8.0*    GFR: Estimated Creatinine Clearance: 83.7 mL/min (by C-G formula based on SCr of 0.83 mg/dL). Liver Function Tests: Recent Labs  Lab 09/10/22 0515 09/13/22 0446  AST 43* 28  ALT 20 13  ALKPHOS 209* 131*  BILITOT 0.6 0.2*  PROT 7.4 5.5*  ALBUMIN 3.9 2.8*    Coagulation Profile: Recent Labs  Lab 09/10/22 0515  INR 1.0    HbA1C: No results for input(s): "HGBA1C" in the last 72 hours.  CBG: Recent Labs  Lab 09/14/22 1129 09/14/22 1641 09/14/22 2118 09/15/22 0807 09/15/22 1146  GLUCAP 103* 102* 164* 81 98    Thyroid Function Tests: No results for input(s): "TSH", "T4TOTAL", "FREET4", "T3FREE", "THYROIDAB" in the last 72 hours.    Radiology  Studies: No results found.      Scheduled Meds:  atorvastatin  40 mg Oral Daily   Chlorhexidine Gluconate Cloth  6 each Topical Daily   enoxaparin (LOVENOX) injection  40 mg Subcutaneous Q24H   ibuprofen  600 mg Oral QID   insulin aspart  0-5 Units Subcutaneous QHS   insulin aspart  0-9 Units Subcutaneous TID WC   levothyroxine  112 mcg Oral Q0600   lumateperone tosylate  42 mg Oral Daily   multivitamin with minerals  1 tablet Oral Daily   oxcarbazepine  600 mg Oral BID   pantoprazole  40 mg Oral Daily   PARoxetine  40 mg Oral Daily   prazosin  2 mg Oral QHS   QUEtiapine  300 mg Oral QHS   ranolazine  1,000 mg Oral BID   thiamine  100 mg Oral Daily   tiZANidine  4 mg Oral TID   trazodone  300 mg Oral QHS       LOS: 5 days  Time spent: 35 minutes    Delfino Lovett, MD [facility] Pager [phone]  If 7PM-7AM, please contact night-coverage [email or web address] Password [email or web address] 09/15/2022, 1:03 PM

## 2022-09-15 NOTE — Progress Notes (Signed)
Occupational Therapy Treatment Patient Details Name: Diana Quinn MRN: [id number] DOB: Oct 17, 1965 Today's Date: 09/15/2022   History of present illness Pt is a 57 y/o F admitted on 09/09/22 after presenting with c/o fall, rolling both ankles inward & hearing a pop. Pt found to have L distal tibial fx nondisplaced & R nondisplaced ankle fx. Podiatry currently recommending conservative care & NWB BLE x 4-6 weeks. 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 Upon arrival to room pt resting in bed, agreeable to tx. Pt reported that she was sitting in the recliner for a while and her legs started to ache. Upon arrival pt was reporting pain relief and declined interest in transfer. Pt willing to perform theraband ex. Pt educated on importance of UE strength with given diagnosis. Pt return demonstrated exercises, noted below, with proper form and return demonstration. Pt making good progress toward goals, will continue to follow POC. Discharge recommendation remains appropriate.     Recommendations for follow up therapy are one component of a multi-disciplinary discharge planning process, led by the attending physician.  Recommendations may be updated based on patient status, additional functional criteria and insurance authorization.    Assistance Recommended at Discharge Frequent or constant Supervision/Assistance  Patient can return home with the following  Help with stairs or ramp for entrance;Two people to help with walking and/or transfers;Two people to help with bathing/dressing/bathroom   Equipment Recommendations  Hospital bed    Recommendations for Other Services      Precautions / Restrictions Precautions Precautions: Fall Restrictions Weight Bearing Restrictions: Yes RLE Weight Bearing: Non weight bearing LLE Weight Bearing: Non weight bearing        Mobility Bed Mobility                    Transfers                         Balance                                           ADL either performed or assessed with clinical judgement   ADL                                              Extremity/Trunk Assessment Upper Extremity Assessment Upper Extremity Assessment: Overall WFL for tasks assessed   Lower Extremity Assessment Lower Extremity Assessment: Defer to PT evaluation        Vision       Perception     Praxis      Cognition Arousal/Alertness: Awake/alert Behavior During Therapy: WFL for tasks assessed/performed Overall Cognitive Status: Within Functional Limits for tasks assessed                                          Exercises Exercises: General Upper Extremity General Exercises - Upper Extremity Shoulder Flexion: AROM, Strengthening, Both, 10 reps, Seated, Theraband Theraband Level (Shoulder Flexion): Level 3 (Green) Shoulder ABduction: AROM, Strengthening, Both, 10 reps,  Seated, Theraband Theraband Level (Shoulder Abduction): Level 3 (Green) Shoulder Horizontal ABduction: AROM, Strengthening, Both, 10 reps, Seated, Theraband Theraband Level (Shoulder Horizontal Abduction): Level 3 (Green) Elbow Flexion: AROM, Strengthening, Both, 10 reps, Seated, Theraband Theraband Level (Elbow Flexion): Level 3 (Green) Elbow Extension: AROM, Strengthening, Both, 10 reps, Seated, Theraband Theraband Level (Elbow Extension): Level 3 (Green)    Shoulder Instructions       General Comments      Pertinent Vitals/ Pain       Pain Assessment Pain Assessment: No/denies pain  Home Living                                          Prior Functioning/Environment              Frequency  Min 2X/week        Progress Toward Goals  OT Goals(current goals can now be found in the care plan section)  Progress  towards OT goals: Progressing toward goals  Acute Rehab OT Goals Patient Stated Goal: To go to rehab OT Goal Formulation: With patient Time For Goal Achievement: 09/24/22 Potential to Achieve Goals: Good ADL Goals Pt Will Perform Lower Body Dressing: with min assist;sitting/lateral leans Pt Will Transfer to Toilet: with min assist;with transfer board;anterior/posterior transfer;bedside commode Pt Will Perform Toileting - Clothing Manipulation and hygiene: with modified independence;sitting/lateral leans  Plan Discharge plan remains appropriate    Co-evaluation                 AM-PAC OT "6 Clicks" Daily Activity     Outcome Measure   Help from another person eating meals?: None Help from another person taking care of personal grooming?: None Help from another person toileting, which includes using toliet, bedpan, or urinal?: A Lot Help from another person bathing (including washing, rinsing, drying)?: A Lot Help from another person to put on and taking off regular upper body clothing?: A Little Help from another person to put on and taking off regular lower body clothing?: A Lot 6 Click Score: 17    End of Session    OT Visit Diagnosis: Other abnormalities of gait and mobility (R26.89);Muscle weakness (generalized) (M62.81)   Activity Tolerance Patient tolerated treatment well   Patient Left in bed;with call bell/phone within reach;with bed alarm set   Nurse Communication          Time: 1413-1430 OT Time Calculation (min): 17 min  Charges: OT General Charges $OT Visit: 1 Visit OT Treatments $Therapeutic Exercise: 8-22 mins  Thresa Ross, OTS

## 2022-09-15 NOTE — Progress Notes (Signed)
Physical Therapy Treatment Patient Details Name: Diana Quinn MRN: [id number] DOB: 1965/09/01 Today's Date: 09/15/2022   History of Present Illness Pt is a 57 y/o F admitted on 09/09/22 after presenting with c/o fall, rolling both ankles inward & hearing a pop. Pt found to have L distal tibial fx nondisplaced & R nondisplaced ankle fx. Podiatry currently recommending conservative care & NWB BLE x 4-6 weeks. PMH: PTSD, bipolar schizophrenia, anxiety, osteoporosis, DM2, CAD s/p MI with non obstructive CAD on cath, seizure disorder, TIA, DJD    PT Comments  Pt ready for session.  Transitions with sitting with cues.  She is able to easily lateral scoot to drop arm recliner at bedside with set up and to keep hips back as scooting and not get too close to edge.  Overall does well.  Supine AROM limiting LLE due to c/o rubbing L heel in cast.  MD/Podiatry aware per pt.    Assistance Recommended at Discharge Frequent or constant Supervision/Assistance  If plan is discharge home, recommend the following:  Can travel by private vehicle    Assist for transportation;Assistance with cooking/housework;Help with stairs or ramp for entrance;A little help with walking and/or transfers;A little help with bathing/dressing/bathroom   No  Equipment Recommendations  Hospital bed;Wheelchair (measurements PT);BSC/3in1    Recommendations for Other Services       Precautions / Restrictions Precautions Precautions: Fall Restrictions Weight Bearing Restrictions: Yes RLE Weight Bearing: Non weight bearing LLE Weight Bearing: Non weight bearing     Mobility  Bed Mobility Overal bed mobility: Needs Assistance Bed Mobility: Supine to Sit     Supine to sit: Modified independent (Device/Increase time)          Transfers Overall transfer level: Needs assistance Equipment used: None              Lateral/Scoot Transfers: Supervision General transfer comment: easily lateral scoots into chair with set  up    Ambulation/Gait               General Gait Details: b/l NWBing, not appropriate   Stairs             Wheelchair Mobility     Tilt Bed    Modified Rankin (Stroke Patients Only)       Balance Overall balance assessment: Needs assistance Sitting-balance support: Feet unsupported, Bilateral upper extremity supported Sitting balance-Leahy Scale: Good Sitting balance - Comments: no LOB"s with scooting today or assist needed                                    Cognition Arousal/Alertness: Awake/alert Behavior During Therapy: WFL for tasks assessed/performed Overall Cognitive Status: Within Functional Limits for tasks assessed                                          Exercises Other Exercises Other Exercises: supine A/AAROM limiting L due to "rubbing" feeling in heel    General Comments        Pertinent Vitals/Pain Pain Assessment Pain Assessment: Faces Faces Pain Scale: Hurts little more Pain Location: BLE (L>R) - reports L heel is "rubbing" - MD aware and per pt has contacted podiatry Pain Descriptors / Indicators: Grimacing, Guarding, Discomfort Pain Intervention(s): Limited activity within patient's tolerance, Monitored during session, Repositioned    Home Living  Prior Function            PT Goals (current goals can now be found in the care plan section) Progress towards PT goals: Progressing toward goals    Frequency    7X/week      PT Plan Current plan remains appropriate    Co-evaluation              AM-PAC PT "6 Clicks" Mobility   Outcome Measure  Help needed turning from your back to your side while in a flat bed without using bedrails?: None Help needed moving from lying on your back to sitting on the side of a flat bed without using bedrails?: None Help needed moving to and from a bed to a chair (including a wheelchair)?: A Little Help needed  standing up from a chair using your arms (e.g., wheelchair or bedside chair)?: Total Help needed to walk in hospital room?: Total Help needed climbing 3-5 steps with a railing? : Total 6 Click Score: 14    End of Session   Activity Tolerance: Patient tolerated treatment well Patient left: in bed;with call bell/phone within reach;with bed alarm set Nurse Communication: Mobility status PT Visit Diagnosis: Muscle weakness (generalized) (M62.81);History of falling (Z91.81);Other abnormalities of gait and mobility (R26.89);Pain Pain - Right/Left: Left Pain - part of body: Ankle and joints of foot     Time: 1610-9604 PT Time Calculation (min) (ACUTE ONLY): 20 min  Charges:    $Therapeutic Activity: 8-22 mins PT General Charges $$ ACUTE PT VISIT: 1 Visit                    Danielle Dess, PTA 09/15/22, 12:38 PM

## 2022-09-15 NOTE — Progress Notes (Signed)
PODIATRY PROGRESS NOTE Patient Name: Diana Quinn  DOB Nov 04, 1965 DOA 09/09/2022  Hospital Day: 7  Assessment:  57 y.o. female with Left distal tibial fracture nondisplaced and right nondisplaced ankle fracture. Plan for nonoperative management bilaterally with NWB bilateral.  Plan:  -Continue plan for conservative care with bilateral nonweightbearing for 4 to 6 weeks. - Patient has developed a posterior heel superficial wound / pressure blister due to inadequate padding in the posterior splint applied in the ED.  - Dress the left heel with betadine, xeroform, extra cast padding and reapplied posterior splint. Was planning to cast however due to wound will wait to see improvement/resolution of this wound prior to casting.  -Patient will need 2 x weekly splint dressing changes to the left lower extremity. Xeroform over wound followed by cast padding x extra padding under heel.  -Patient is okay to be discharged from podiatric standpoint, will need splint dressing changes at SNF.  -Patient will benefit from nursing facility due to bilateral nonweightbearing status.  - Changed Right splitn dressinga nd removed splint, Will transition to CAM boot on right side.  -She will come see [place] in clinic 2 weeks from discharge Will continue to follow        Corinna Gab, DPM [facility]    Subjective:  Patient seen bedside having pain in back of left heel due to splint dressing. Reports hasn't been changed since placed in ED.   Objective:   Vitals:   09/15/22 0810 09/15/22 1728  BP: 135/83 122/76  Pulse: 76 77  Resp: 16 18  Temp: 98.1 F (36.7 C) 97.7 F (36.5 C)  SpO2: 94% 97%       Latest Ref Rng & Units 09/13/2022    4:46 AM 09/12/2022    7:42 AM 09/10/2022    5:15 AM  CBC  WBC 4.0 - 10.5 K/uL 8.5  14.4  13.4   Hemoglobin 12.0 - 15.0 g/dL 8.8  16.1  09.6   Hematocrit 36.0 - 46.0 % 27.2  29.6  42.5   Platelets 150 - 400 K/uL 286  286  441        Latest Ref Rng &  Units 09/13/2022    4:46 AM 09/12/2022    7:42 AM 09/10/2022    5:15 AM  BMP  Glucose 70 - 99 mg/dL 045  409  86   BUN 6 - 20 mg/dL 12  15  16    Creatinine 0.44 - 1.00 mg/dL 8.11  9.14  7.82   Sodium 135 - 145 mmol/L 142  143  136   Potassium 3.5 - 5.1 mmol/L 3.5  4.5  4.0   Chloride 98 - 111 mmol/L 113  115  105   CO2 22 - 32 mmol/L 23  16  22    Calcium 8.9 - 10.3 mg/dL 8.0  8.6  9.0     General: AAOx3, NAD  Lower Extremity Exam Vasc: R - PT palpable, DP palpable. Cap refill < 3 sec to digits  L - PT palpable, DP palpable. Cap refill <3 sec to digits  Derm: R - Ecchymosis but no open wounds  L - Pressure blister developed at the posterior aspect of the left heel lateral aspect. Superficial, 1 cm in diameter, no erythema or drainage.    MSK:  R - Decreased tender to palpation to right medial/lateral ankle  L -  No gross deformities. Decreased edema, motor intact to all toes  Neuro: R - Gross sensation  intact. Gross motor function intact   L - Gross sensation intact. Gross motor function intact   Radiology:  Results reviewed. See assessment for pertinent imaging results

## 2022-09-15 NOTE — Plan of Care (Signed)
  Problem: Elimination: Goal: Will not experience complications related to bowel motility Outcome: Progressing   Problem: Nutrition: Goal: Adequate nutrition will be maintained Outcome: Progressing   Problem: Activity: Goal: Risk for activity intolerance will decrease Outcome: Progressing   Problem: Skin Integrity: Goal: Risk for impaired skin integrity will decrease Outcome: Progressing   Problem: Education: Goal: Ability to describe self-care measures that may prevent or decrease complications (Diabetes Survival Skills Education) will improve Outcome: Progressing   Problem: Pain Managment: Goal: General experience of comfort will improve Outcome: Progressing

## 2022-09-15 NOTE — TOC Progression Note (Signed)
Transition of Care [facility]) - Progression Note    Patient Details  Name: Diana Quinn MRN: [id number] Date of Birth: 1965-11-01  [facility]) CM/SW Contact  Marlowe Sax, RN Phone Number: 09/15/2022, 3:03 PM  Clinical Narrative:     Spoke with the patient and explained that the facilities we have checked with none have made a bed offer, she approved looking a little further out [place], [facility], [place], I sent the request and am awaiting bed offers  Expected Discharge Plan:  (tbd) Barriers to Discharge: Continued Medical Work up  Expected Discharge Plan and Services   Discharge Planning Services: CM Consult   Living arrangements for the past 2 months: Single Family Home                                       Social Determinants of Health (SDOH) Interventions SDOH Screenings   Food Insecurity: No Food Insecurity (09/10/2022)  Housing: Low Risk  (09/10/2022)  Transportation Needs: No Transportation Needs (09/10/2022)  Utilities: Not At Risk (09/10/2022)  Depression (PHQ2-9): Low Risk  (10/26/2018)  Tobacco Use: Low Risk  (09/12/2022)    Readmission Risk Interventions     No data to display

## 2022-09-16 DIAGNOSIS — Z87898 Personal history of other specified conditions: Secondary | ICD-10-CM | POA: Diagnosis not present

## 2022-09-16 DIAGNOSIS — F331 Major depressive disorder, recurrent, moderate: Secondary | ICD-10-CM | POA: Diagnosis not present

## 2022-09-16 DIAGNOSIS — S82891A Other fracture of right lower leg, initial encounter for closed fracture: Secondary | ICD-10-CM | POA: Diagnosis not present

## 2022-09-16 LAB — GLUCOSE, CAPILLARY
Glucose-Capillary: 84 mg/dL (ref 70–99)
Glucose-Capillary: 93 mg/dL (ref 70–99)
Glucose-Capillary: 95 mg/dL (ref 70–99)
Glucose-Capillary: 104 mg/dL — ABNORMAL HIGH (ref 70–99)

## 2022-09-16 MED ORDER — OXYCODONE-ACETAMINOPHEN 7.5-325 MG PO TABS
1.0000 | ORAL_TABLET | Freq: Three times a day (TID) | ORAL | Status: DC | PRN
  Administered 2022-09-16 – 2022-09-17 (×3): 1 via ORAL
  Filled 2022-09-16 (×3): qty 1

## 2022-09-16 NOTE — TOC Progression Note (Signed)
Transition of Care [facility]) - Progression Note    Patient Details  Name: [name] MRN: [id number] Date of Birth: 1965-09-13  Transition of Care [facility]) CM/SW Contact  Marlowe Sax, RN Phone Number: 09/16/2022, 9:31 AM  Clinical Narrative:    No bed offers at this time for STR, expanded the bedsearch to all of the hub   Expected Discharge Plan:  (tbd) Barriers to Discharge: Continued Medical Work up  Expected Discharge Plan and Services   Discharge Planning Services: CM Consult   Living arrangements for the past 2 months: Single Family Home                                       Social Determinants of Health (SDOH) Interventions SDOH Screenings   Food Insecurity: No Food Insecurity (09/10/2022)  Housing: Low Risk  (09/10/2022)  Transportation Needs: No Transportation Needs (09/10/2022)  Utilities: Not At Risk (09/10/2022)  Depression (PHQ2-9): Low Risk  (10/26/2018)  Tobacco Use: Low Risk  (09/12/2022)    Readmission Risk Interventions     No data to display

## 2022-09-16 NOTE — Progress Notes (Signed)
Physical Therapy Treatment Patient Details Name: Diana Quinn MRN: [id number] DOB: Jan 30, 1966 Today's Date: 09/16/2022   History of Present Illness Pt is a 57 y/o F admitted on 09/09/22 after presenting with c/o fall, rolling both ankles inward & hearing a pop. Pt found to have L distal tibial fx nondisplaced & R nondisplaced ankle fx. Podiatry currently recommending conservative care & NWB BLE x 4-6 weeks. PMH: PTSD, bipolar schizophrenia, anxiety, osteoporosis, DM2, CAD s/p MI with non obstructive CAD on cath, seizure disorder, TIA, DJD    PT Comments  Pt ready for session.  Stated she has a pressure sore L heel that was discovered during splint change.  Care is again taken to limit friction during session.  She is able to transfer with lateral scoot to recliner, bed and back to recliner and remains up after session.  Set up with supervision.  RLE AROM x 10.  Limited LLE ex to limit friction.  Pt progressing well with mobility.  If she needs to discharge home, she will need hospital bed, wheelchair, drop arm BSC and transport home due to NWB BLE.  She currently sleeps on air mattress in living room at her sons house.  She will need transfer training for wheelchair level.  She will benefit from continued therapies at discharge as available given NWB.      Assistance Recommended at Discharge Frequent or constant Supervision/Assistance  If plan is discharge home, recommend the following:  Can travel by private vehicle    Assist for transportation;Assistance with cooking/housework;Help with stairs or ramp for entrance;A little help with walking and/or transfers;A little help with bathing/dressing/bathroom      Equipment Recommendations  Hospital bed;Wheelchair (measurements PT);BSC/3in1    Recommendations for Other Services       Precautions / Restrictions Precautions Precautions: Fall Restrictions Weight Bearing Restrictions: Yes RLE Weight Bearing: Non weight bearing LLE Weight  Bearing: Non weight bearing     Mobility  Bed Mobility Overal bed mobility: Needs Assistance Bed Mobility: Supine to Sit     Supine to sit: Modified independent (Device/Increase time)          Transfers Overall transfer level: Needs assistance Equipment used: None Transfers: Bed to chair/wheelchair/BSC            Lateral/Scoot Transfers: Supervision General transfer comment: easily lateral scoots into chair with set up to chair ->bed -> chair    Ambulation/Gait               General Gait Details: b/l NWBing, not appropriate   Stairs             Wheelchair Mobility     Tilt Bed    Modified Rankin (Stroke Patients Only)       Balance Overall balance assessment: Needs assistance Sitting-balance support: Feet unsupported, Bilateral upper extremity supported Sitting balance-Leahy Scale: Good                                      Cognition Arousal/Alertness: Awake/alert Behavior During Therapy: WFL for tasks assessed/performed Overall Cognitive Status: Within Functional Limits for tasks assessed                                          Exercises Other Exercises Other Exercises: supine A/AAROM limiting L due to pt stating pressure ulcer  in cast during change yesterday    General Comments        Pertinent Vitals/Pain Pain Assessment Pain Assessment: Faces Faces Pain Scale: Hurts little more Pain Location: LLE > RLE Pain Descriptors / Indicators: Guarding, Discomfort Pain Intervention(s): Limited activity within patient's tolerance, Monitored during session, Repositioned    Home Living                          Prior Function            PT Goals (current goals can now be found in the care plan section) Progress towards PT goals: Progressing toward goals    Frequency    7X/week      PT Plan Current plan remains appropriate    Co-evaluation              AM-PAC PT "6 Clicks"  Mobility   Outcome Measure  Help needed turning from your back to your side while in a flat bed without using bedrails?: None Help needed moving from lying on your back to sitting on the side of a flat bed without using bedrails?: None Help needed moving to and from a bed to a chair (including a wheelchair)?: A Little Help needed standing up from a chair using your arms (e.g., wheelchair or bedside chair)?: Total Help needed to walk in hospital room?: Total Help needed climbing 3-5 steps with a railing? : Total 6 Click Score: 14    End of Session   Activity Tolerance: Patient tolerated treatment well Patient left: in chair;with call bell/phone within reach;with chair alarm set Nurse Communication: Mobility status PT Visit Diagnosis: Muscle weakness (generalized) (M62.81);History of falling (Z91.81);Other abnormalities of gait and mobility (R26.89);Pain Pain - Right/Left: Left Pain - part of body: Ankle and joints of foot     Time: 8295-6213 PT Time Calculation (min) (ACUTE ONLY): 26 min  Charges:    $Therapeutic Activity: 8-22 mins PT General Charges $$ ACUTE PT VISIT: 1 Visit                    Danielle Dess, PTA 09/16/22, 12:48 PM

## 2022-09-16 NOTE — Progress Notes (Signed)
PROGRESS NOTE    Diana Quinn  QMV:[id number] DOB: [birth year] DOA: 09/09/2022 PCP: Orson Eva, NP (Confirm with patient/family/NH records and if not entered, this HAS to be entered at [facility] point of entry. "No PCP" if truly none.)   Brief Narrative: (Start on day 1 of progress note - keep it brief and live) 57 y.o. female with medical history significant of  PTSD/Bipolar/Schizophrenia, anxiety,Osteoporosis, DMII with history of hypogylcemia, CAD s/p MI with non-obstructive CAD on Cath per patient, Seizure d/o djd for the back , TIA , hx of gastric bypass for treatment of obesity.  She is admitted for nondisplaced medial malleolus and distal lateral calcaneus fracture s/p mechanical fall  7/3: Podiatry recommends conservative management 7/4: Patient remains hypotensive but not symptomatic.  Still debating if she would consider rehab due to financial constraints 7/5: Signs and symptoms of acute drug intoxication/withdrawal this morning 7/6: Psych consult-decreased Valium to 2 mg twice daily 7/7: Valium discontinued per psych 7/8: Changed ibuprofen from scheduled to as needed, dressing changed by podiatry 7/9: Added Percocet for pain control   Assessment & Plan:   Principal Problem:   Ankle fracture Active Problems:   Closed fracture of left distal tibia   Major depressive disorder, recurrent episode, moderate (HCC)  Acute metabolic encephalopathy CT chest negative for PE, chest x-ray negative.  ABG within normal limit Patient was found to be confused, disoriented clammy and restless.  She was very tremulous and behavior concerning for drug intoxication/withdrawal on 7/5 Nursing found a bottle of Percocet, Zanaflex, Seroquel, Advil in her purse.  Unsure if she over ingested any of this or something else.  She did receive 1 dose of Compazine and Dilaudid.  Difficult to know if she reacted to this Discontinued safety sitter She is back to her normal baseline mental status in few hours  on 7/5.  Appreciate psych input.  Discontinued Valium.  Continue Caplyta and Seroquel  Left distal tibial fracture/nondisplaced and right nondisplaced ankle fracture -Appreciate podiatry input Conservative management plan.  Nonweightbearing bilaterally for 4 to 6 weeks followed by weightbearing as tolerated in cam boot -Patient had developed posterior heel superficial wound/pressure blister due to inadequate padding in the posterior splint.  Podiatry has changed the dressing yesterday on 7/8 with extra cast padding on the left heel -She will need twice a week splint dressing changes to the left lower extremity.  Xeroform over wound followed by cast padding times extra padding under heel per podiatry - will need splint dressing changes at SNF.  -Patient will benefit from nursing facility due to bilateral nonweightbearing status.  -She is transitioned to CAM boot on right side.  -Outpatient podiatry follow-up in 2 weeks - PT and OT evaluation recommends SNF.  TOC working on placement.    PTSD/Bipolar/Anxiety  -Continue Paxil, Seroquel, trazodone, Caplyta Seen by psychiatry.  Continue Caplyta and Seroquel.  Valium discontinued   Hypotension Transient and resolved now  Acute urinary retention  Foley catheter placed on 7/5, removed on 7/7   Osteoporosis  - on fosamax   DMII -Sliding scale insulin for now - A1c 5.5   CAD s/p MI  -CAD non-obstructive but per patient cath noted small caliber vessels  -continue ranxea   DJD of the back  -chronic back pain  -follows with pcp and pain management    Hypothyroidism -Continue synthroid   TIA hx    Obesity  -s/p gastric bypass    Hx if Seizure d/o  -on trileptal 600 mg twice daily.  DVT prophylaxis: Lovenox enoxaparin (LOVENOX) injection 40 mg Start: 09/10/22 2200     Code Status: Full code Family Communication: Son was updated on 7/5 over phone.  None at bedside Disposition Plan: Medically stable, SNF when bed available     Consultants:  Podiatry Psychiatry Palliative care  Subjective:  Requesting some pain medicine.  No other issues  Objective: Vitals:   09/15/22 1728 09/16/22 0431 09/16/22 0724 09/16/22 1425  BP: 122/76  126/74 126/65  Pulse: 77  81 91  Resp: 18  16 17   Temp: 97.7 F (36.5 C)  98.4 F (36.9 C) 98.3 F (36.8 C)  TempSrc:      SpO2: 97%  99% 98%  Weight:  93 kg    Height:        Intake/Output Summary (Last 24 hours) at 09/16/2022 1510 Last data filed at 09/15/2022 1928 Gross per 24 hour  Intake 240 ml  Output --  Net 240 ml    Filed Weights   09/14/22 0500 09/15/22 0500 09/16/22 0431  Weight: 92.6 kg 92.4 kg 93 kg    Examination:  General exam: 57 year old female lying in bed comfortably without any acute distress Respiratory system: Clear to auscultation. Respiratory effort normal. Cardiovascular system: S1 & S2 heard, RRR. No JVD, murmurs, rubs, gallops or clicks. No pedal edema. Gastrointestinal system: Abdomen is nondistended, soft and nontender. No organomegaly or masses felt. Normal bowel sounds heard. Central nervous system: Awake and alert. No focal neurological deficits. Extremities: Ace wrapped over bilateral ankle Skin: No rashes, lesions or ulcers Psychiatry: Normal mood and affect    Data Reviewed: I have personally reviewed following labs and imaging studies  CBC: Recent Labs  Lab 09/09/22 2335 09/10/22 0515 09/12/22 0742 09/13/22 0446  WBC 8.4 13.4* 14.4* 8.5  HGB 11.7* 13.7 10.0* 8.8*  HCT 36.4 42.5 29.6* 27.2*  MCV 101.1* 100.5* 95.8 98.6  PLT 380 441* 286 286    Basic Metabolic Panel: Recent Labs  Lab 09/09/22 2335 09/10/22 0515 09/12/22 0742 09/13/22 0446  NA 135 136 143 142  K 4.1 4.0 4.5 3.5  CL 105 105 115* 113*  CO2 24 22 16* 23  GLUCOSE 90 86 124* 111*  BUN 17 16 15 12   CREATININE 0.83 0.80 0.83 0.83  CALCIUM 8.2* 9.0 8.6* 8.0*    GFR: Estimated Creatinine Clearance: 83.9 mL/min (by C-G formula based on SCr of  0.83 mg/dL). Liver Function Tests: Recent Labs  Lab 09/10/22 0515 09/13/22 0446  AST 43* 28  ALT 20 13  ALKPHOS 209* 131*  BILITOT 0.6 0.2*  PROT 7.4 5.5*  ALBUMIN 3.9 2.8*    Coagulation Profile: Recent Labs  Lab 09/10/22 0515  INR 1.0    HbA1C: No results for input(s): "HGBA1C" in the last 72 hours.  CBG: Recent Labs  Lab 09/15/22 1146 09/15/22 1717 09/15/22 2109 09/16/22 0725 09/16/22 1111  GLUCAP 98 74 102* 84 93    Thyroid Function Tests: No results for input(s): "TSH", "T4TOTAL", "FREET4", "T3FREE", "THYROIDAB" in the last 72 hours.    Radiology Studies: DG Ankle Complete Left  Result Date: 09/15/2022 CLINICAL DATA:  Ankle fracture and pain. EXAM: LEFT ANKLE COMPLETE - 3+ VIEW COMPARISON:  Left ankle radiograph dated 09/09/2022. FINDINGS: Nondisplaced oblique fracture of the distal tibia. No new fracture. The ankle mortise is intact. Osteophyte along the anterior talus. Overlying cast noted. IMPRESSION: Nondisplaced oblique fracture of the distal tibia status post cast placement. Electronically Signed   By: Ceasar Mons.D.  On: 09/15/2022 15:39        Scheduled Meds:  atorvastatin  40 mg Oral Daily   cephALEXin  500 mg Oral Q12H   Chlorhexidine Gluconate Cloth  6 each Topical Daily   enoxaparin (LOVENOX) injection  40 mg Subcutaneous Q24H   insulin aspart  0-5 Units Subcutaneous QHS   insulin aspart  0-9 Units Subcutaneous TID WC   levothyroxine  112 mcg Oral Q0600   lumateperone tosylate  42 mg Oral Daily   multivitamin with minerals  1 tablet Oral Daily   oxcarbazepine  600 mg Oral BID   pantoprazole  40 mg Oral Daily   PARoxetine  40 mg Oral Daily   prazosin  2 mg Oral QHS   QUEtiapine  300 mg Oral QHS   ranolazine  1,000 mg Oral BID   thiamine  100 mg Oral Daily   tiZANidine  4 mg Oral TID   trazodone  300 mg Oral QHS       LOS: 6 days    Time spent: 35 minutes    Delfino Lovett, MD [facility] Pager [phone]  [phone]  If 7PM-7AM, please contact night-coverage [email or web address] Password [email or web address] 09/16/2022, 3:10 PM

## 2022-09-17 DIAGNOSIS — E871 Hypo-osmolality and hyponatremia: Secondary | ICD-10-CM

## 2022-09-17 DIAGNOSIS — S82302D Unspecified fracture of lower end of left tibia, subsequent encounter for closed fracture with routine healing: Secondary | ICD-10-CM | POA: Diagnosis not present

## 2022-09-17 DIAGNOSIS — F331 Major depressive disorder, recurrent, moderate: Secondary | ICD-10-CM | POA: Diagnosis not present

## 2022-09-17 HISTORY — DX: Hypo-osmolality and hyponatremia: E87.1

## 2022-09-17 LAB — CBC
HCT: 29.9 % — ABNORMAL LOW (ref 36.0–46.0)
Hemoglobin: 9.9 g/dL — ABNORMAL LOW (ref 12.0–15.0)
MCH: 31.8 pg (ref 26.0–34.0)
MCHC: 33.1 g/dL (ref 30.0–36.0)
MCV: 96.1 fL (ref 80.0–100.0)
Platelets: 341 10*3/uL (ref 150–400)
RBC: 3.11 MIL/uL — ABNORMAL LOW (ref 3.87–5.11)
RDW: 13.8 % (ref 11.5–15.5)
WBC: 7 10*3/uL (ref 4.0–10.5)
nRBC: 0 % (ref 0.0–0.2)

## 2022-09-17 LAB — GLUCOSE, CAPILLARY
Glucose-Capillary: 103 mg/dL — ABNORMAL HIGH (ref 70–99)
Glucose-Capillary: 98 mg/dL (ref 70–99)
Glucose-Capillary: 127 mg/dL — ABNORMAL HIGH (ref 70–99)
Glucose-Capillary: 134 mg/dL — ABNORMAL HIGH (ref 70–99)

## 2022-09-17 LAB — URINE CULTURE: Culture: >=100000 — AB

## 2022-09-17 LAB — BASIC METABOLIC PANEL
Anion gap: 6 (ref 5–15)
BUN: 12 mg/dL (ref 6–20)
CO2: 23 mmol/L (ref 22–32)
Calcium: 7.4 mg/dL — ABNORMAL LOW (ref 8.9–10.3)
Chloride: 100 mmol/L (ref 98–111)
Creatinine, Ser: 0.63 mg/dL (ref 0.44–1.00)
GFR, Estimated: >60 mL/min (ref >60)
Glucose, Bld: 79 mg/dL (ref 70–99)
Potassium: 3.9 mmol/L (ref 3.5–5.1)
Sodium: 129 mmol/L — ABNORMAL LOW (ref 135–145)

## 2022-09-17 LAB — IRON AND TIBC
Iron: 66 ug/dL (ref 28–170)
Saturation Ratios: 21 % (ref 10.4–31.8)
TIBC: 318 ug/dL (ref 250–450)
UIBC: 252 ug/dL

## 2022-09-17 LAB — VITAMIN B12: Vitamin B-12: 639 pg/mL (ref 180–914)

## 2022-09-17 LAB — FERRITIN: Ferritin: 21 ng/mL (ref 11–307)

## 2022-09-17 MED ORDER — OXYCODONE-ACETAMINOPHEN 5-325 MG PO TABS
2.0000 | ORAL_TABLET | ORAL | Status: DC | PRN
  Administered 2022-09-17 – 2022-09-25 (×41): 2 via ORAL
  Filled 2022-09-17 (×41): qty 2

## 2022-09-17 MED ORDER — HYDROXYZINE HCL 25 MG PO TABS
25.0000 mg | ORAL_TABLET | Freq: Three times a day (TID) | ORAL | Status: DC | PRN
  Administered 2022-09-18 – 2022-09-25 (×7): 25 mg via ORAL
  Filled 2022-09-17 (×9): qty 1

## 2022-09-17 NOTE — Progress Notes (Addendum)
Progress Note   Patient: Diana Quinn YNW:[id number] DOB: 07-Feb-1966 DOA: 09/09/2022     7 DOS: the patient was seen and examined on 09/17/2022   Brief hospital course: 57 y.o. female with medical history significant of  PTSD/Bipolar/Schizophrenia, anxiety,Osteoporosis, DMII with history of hypogylcemia, CAD s/p MI with non-obstructive CAD on Cath per patient, Seizure d/o djd for the back , TIA , hx of gastric bypass for treatment of obesity.  She is admitted for nondisplaced medial malleolus and distal lateral calcaneus fracture s/p mechanical fall   Patient is seen by podiatry, not a surgical candidate.  But deemed to be nonweightbearing for bilateral legs.  Currently patient controlled with pain medicine, pending nursing home placement.   Principal Problem:   Ankle fracture Active Problems:   Closed fracture of left distal tibia   Major depressive disorder, recurrent episode, moderate (HCC)   Hyponatremia   Assessment and Plan: Acute metabolic encephalopathy E  Coli UTI CT chest negative for PE, chest x-ray negative.  ABG within normal limit Patient was found to be confused, disoriented clammy and restless.  She was very tremulous and behavior concerning for drug intoxication/withdrawal on 7/5 Nursing found a bottle of Percocet, Zanaflex, Seroquel, Advil in her purse.  Unsure if she over ingested any of this or something else.  She did receive 1 dose of Compazine and Dilaudid.  Difficult to know if she reacted to this Discontinued safety sitter She is back to her normal baseline mental status in few hours on 7/5.  Appreciate psych input.  Discontinued Valium.  Continue Caplyta and Seroquel Patient had a abnormal urine with urine culture grew E. coli.  However, patient mental status has improved, she is currently on keflex.     Left distal tibial fracture/nondisplaced and right nondisplaced ankle fracture -Appreciate podiatry input Conservative management plan.  Nonweightbearing  bilaterally for 4 to 6 weeks followed by weightbearing as tolerated in cam boot -Patient had developed posterior heel superficial wound/pressure blister due to inadequate padding in the posterior splint.  Podiatry has changed the dressing yesterday on 7/8 with extra cast padding on the left heel -She will need twice a week splint dressing changes to the left lower extremity.  Xeroform over wound followed by cast padding times extra padding under heel per podiatry - will need splint dressing changes at SNF.     PTSD/Bipolar/Anxiety  -Continue Paxil, Seroquel, trazodone, Caplyta Seen by psychiatry.  Continue Caplyta and Seroquel.  Valium discontinued    Hypotension Transient and resolved now   Acute urinary retention  Foley catheter placed on 7/5, removed on 7/7   DMII -Sliding scale insulin for now - A1c 5.5   CAD s/p MI  -CAD non-obstructive but per patient cath noted small caliber vessels  -continue ranxea   DJD of the back  -chronic back pain  -follows with pcp and pain management    TIA hx    Obesity  -s/p gastric bypass    Hx if Seizure d/o  -on trileptal 600 mg twice daily.         Subjective:  Patient doing well today, no complaint.  Physical Exam: Vitals:   09/16/22 1425 09/16/22 2320 09/17/22 0500 09/17/22 0727  BP: 126/65 108/76  132/76  Pulse: 91 84  72  Resp: 17 15  17   Temp: 98.3 F (36.8 C) 98 F (36.7 C)  (!) 97.4 F (36.3 C)  TempSrc:      SpO2: 98% 97%  97%  Weight:   93.3 kg  Height:       General exam: Appears calm and comfortable  Respiratory system: Clear to auscultation. Respiratory effort normal. Cardiovascular system: S1 & S2 heard, RRR. No JVD, murmurs, rubs, gallops or clicks. No pedal edema. Gastrointestinal system: Abdomen is nondistended, soft and nontender. No organomegaly or masses felt. Normal bowel sounds heard. Central nervous system: Alert and oriented. No focal neurological deficits. Extremities: Symmetric 5 x 5  power. Skin: No rashes, lesions or ulcers Psychiatry: Judgement and insight appear normal. Mood & affect appropriate. '  Data Reviewed:  Lab results reviewed.  Family Communication: None  Disposition: Status is: Inpatient Remains inpatient appropriate because: Unsafe discharge, pending nursing home placement.     Time spent: 35 minutes  Author: Marrion Coy, MD 09/17/2022 1:28 PM  For on call review [email or web address].

## 2022-09-17 NOTE — Hospital Course (Addendum)
57 y.o. female with medical history significant of  PTSD/Bipolar/Schizophrenia, anxiety,Osteoporosis, DMII with history of hypogylcemia, CAD s/p MI with non-obstructive CAD on Cath per patient, Seizure d/o djd for the back , TIA , hx of gastric bypass for treatment of obesity.  She is admitted for nondisplaced medial malleolus and distal lateral calcaneus fracture s/p mechanical fall   Patient is seen by podiatry, not a surgical candidate.  But deemed to be nonweightbearing for bilateral legs.  Received authorization for acute inpatient rehab on 09/25/2022.  Of note patient does take chronic pain medications at home usually around twice a day.  Will try to use Tylenol instead.  Prescribed 10 mg of oxycodone as needed.  Will give Lovenox injections until able to weight-bear to prevent DVT.

## 2022-09-17 NOTE — Progress Notes (Signed)
   09/17/22 1400  Spiritual Encounters  Type of Visit Initial  Care provided to: Patient  Orlene Erm partners present during Programmer, systems;Other (comment) (two witnesses)  Referral source Patient request  Reason for visit Advance directives  OnCall Visit No  Spiritual Framework  Presenting Themes Courage hope and growth  Interventions  Spiritual Care Interventions Made Reflective listening  Intervention Outcomes  Outcomes Awareness of support  Spiritual Care Plan  Spiritual Care Issues Still Outstanding No further spiritual care needs at this time (see row info)   Chaplain completed Advanced Directive as requested. Chaplain spiritual support services remain available as needed.

## 2022-09-17 NOTE — Progress Notes (Signed)
Occupational Therapy Treatment Patient Details Name: Diana Quinn MRN: [id number] DOB: 1965/08/13 Today's Date: 09/17/2022   History of present illness Pt is a 57 y/o F admitted on 09/09/22 after presenting with c/o fall, rolling both ankles inward & hearing a pop. Pt found to have L distal tibial fx nondisplaced & R nondisplaced ankle fx. Podiatry currently recommending conservative care & NWB BLE x 4-6 weeks. 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 Upon arrival to room pt resting in bed, agreeable to tx. Pt requires set up on drop arm BSC, and supervision during transfer. Supervision for bed mobility 2/2 to two attempts to use RLE for mobility. Reviewed HEP with appropriate return demonstration. Pt expressed that they felt encouraged by today's progress with OT and PT, in the form of transfering to w/c and BSC. Pt making good progress toward goals, will continue to follow POC. Discharge recommendation remains appropriate.     Recommendations for follow up therapy are one component of a multi-disciplinary discharge planning process, led by the attending physician.  Recommendations may be updated based on patient status, additional functional criteria and insurance authorization.    Assistance Recommended at Discharge Frequent or constant Supervision/Assistance  Patient can return home with the following  Help with stairs or ramp for entrance;Two people to help with walking and/or transfers;Two people to help with bathing/dressing/bathroom   Equipment Recommendations  Hospital bed    Recommendations for Other Services      Precautions / Restrictions Precautions Precautions: Fall Restrictions Weight Bearing Restrictions: Yes RLE Weight Bearing: Non weight bearing LLE Weight Bearing: Non weight bearing       Mobility Bed Mobility Overal bed  mobility: Needs Assistance Bed Mobility: Supine to Sit     Supine to sit: Supervision, HOB elevated Sit to supine: Supervision, HOB elevated   General bed mobility comments: Pt attempted to use RLE facilitate bed mobility, supervision/cueing for safety and cueing    Transfers Overall transfer level: Needs assistance Equipment used: None Transfers: Bed to chair/wheelchair/BSC            Lateral/Scoot Transfers: Supervision (surface to surface transfer)       Balance Overall balance assessment: Needs assistance Sitting-balance support: Feet unsupported, Bilateral upper extremity supported Sitting balance-Leahy Scale: Good Sitting balance - Comments: no LOB"s with scooting today or assist needed                                   ADL either performed or assessed with clinical judgement   ADL Overall ADL's : Needs assistance/impaired                         Toilet Transfer: Supervision/safety;BSC/3in1;Requires drop arm                  Extremity/Trunk Assessment Upper Extremity Assessment Upper Extremity Assessment: Overall WFL for tasks assessed            Vision       Perception     Praxis      Cognition Arousal/Alertness: Awake/alert Behavior During Therapy: WFL for tasks assessed/performed Overall Cognitive Status: Within Functional Limits for tasks assessed  General Comments: Pt is A and O x 4. Agreeable to session and pleasant throughout.        Exercises      Shoulder Instructions       General Comments      Pertinent Vitals/ Pain       Pain Assessment Pain Assessment: 0-10 Pain Score: 7  Pain Location: LLE > RLE Pain Descriptors / Indicators: Guarding, Discomfort Pain Intervention(s): Premedicated before session, Relaxation  Home Living                                          Prior Functioning/Environment              Frequency   Min 1X/week        Progress Toward Goals  OT Goals(current goals can now be found in the care plan section)  Progress towards OT goals: Progressing toward goals  Acute Rehab OT Goals Patient Stated Goal: to go to rehab OT Goal Formulation: With patient Time For Goal Achievement: 09/24/22 Potential to Achieve Goals: Good ADL Goals Pt Will Perform Lower Body Dressing: with min assist;sitting/lateral leans Pt Will Transfer to Toilet: with min assist;with transfer board;anterior/posterior transfer;bedside commode Pt Will Perform Toileting - Clothing Manipulation and hygiene: with modified independence;sitting/lateral leans  Plan Discharge plan remains appropriate    Co-evaluation                 AM-PAC OT "6 Clicks" Daily Activity     Outcome Measure   Help from another person eating meals?: None Help from another person taking care of personal grooming?: None Help from another person toileting, which includes using toliet, bedpan, or urinal?: A Lot Help from another person bathing (including washing, rinsing, drying)?: A Lot Help from another person to put on and taking off regular upper body clothing?: A Little Help from another person to put on and taking off regular lower body clothing?: A Lot 6 Click Score: 17    End of Session    OT Visit Diagnosis: Other abnormalities of gait and mobility (R26.89);Muscle weakness (generalized) (M62.81)   Activity Tolerance Patient tolerated treatment well   Patient Left in bed;with call bell/phone within reach   Nurse Communication Mobility status;Patient requests pain meds        Time: 1610-9604 OT Time Calculation (min): 15 min  Charges: OT General Charges $OT Visit: 1 Visit OT Treatments $Self Care/Home Management : 8-22 mins  Thresa Ross, OTS

## 2022-09-17 NOTE — Plan of Care (Signed)
  Problem: Education: Goal: Knowledge of General Education information will improve Description: Including pain rating scale, medication(s)/side effects and non-pharmacologic comfort measures Outcome: Progressing   Problem: Health Behavior/Discharge Planning: Goal: Ability to manage health-related needs will improve Outcome: Progressing   Problem: Clinical Measurements: Goal: Ability to maintain clinical measurements within normal limits will improve Outcome: Progressing Goal: Will remain free from infection Outcome: Progressing Goal: Diagnostic test results will improve Outcome: Progressing Goal: Respiratory complications will improve Outcome: Progressing Goal: Cardiovascular complication will be avoided Outcome: Progressing   Problem: Activity: Goal: Risk for activity intolerance will decrease Outcome: Progressing   Problem: Nutrition: Goal: Adequate nutrition will be maintained Outcome: Progressing   Problem: Coping: Goal: Level of anxiety will decrease Outcome: Progressing   Problem: Elimination: Goal: Will not experience complications related to bowel motility Outcome: Progressing Goal: Will not experience complications related to urinary retention Outcome: Progressing   Problem: Pain Managment: Goal: General experience of comfort will improve Outcome: Progressing   Problem: Safety: Goal: Ability to remain free from injury will improve Outcome: Progressing   Problem: Skin Integrity: Goal: Risk for impaired skin integrity will decrease Outcome: Progressing   Problem: Education: Goal: Ability to describe self-care measures that may prevent or decrease complications (Diabetes Survival Skills Education) will improve Outcome: Progressing Goal: Individualized Educational Video(s) Outcome: Progressing   Problem: Coping: Goal: Ability to adjust to condition or change in health will improve Outcome: Progressing   Problem: Coping: Goal: Ability to adjust to  condition or change in health will improve Outcome: Progressing   Problem: Fluid Volume: Goal: Ability to maintain a balanced intake and output will improve Outcome: Progressing   Problem: Health Behavior/Discharge Planning: Goal: Ability to identify and utilize available resources and services will improve Outcome: Progressing Goal: Ability to manage health-related needs will improve Outcome: Progressing   Problem: Metabolic: Goal: Ability to maintain appropriate glucose levels will improve Outcome: Progressing   Problem: Nutritional: Goal: Maintenance of adequate nutrition will improve Outcome: Progressing Goal: Progress toward achieving an optimal weight will improve Outcome: Progressing   Problem: Nutritional: Goal: Maintenance of adequate nutrition will improve Outcome: Progressing Goal: Progress toward achieving an optimal weight will improve Outcome: Progressing   Problem: Skin Integrity: Goal: Risk for impaired skin integrity will decrease Outcome: Progressing   Problem: Tissue Perfusion: Goal: Adequacy of tissue perfusion will improve Outcome: Progressing

## 2022-09-17 NOTE — Progress Notes (Signed)
  Chaplain On-Call responded to Spiritual Care Consult Order from Delfino Lovett, MD.  The request was to assist the patient with Advance Directives information.  Chaplain made two visit attempts: 1020 and 1200 hours. The patient was not available at either time.  Chaplain will refer to the Afternoon Chaplain for completion of the Order.  Chaplain Evelena Peat M.Div., [facility]

## 2022-09-17 NOTE — TOC Progression Note (Signed)
Transition of Care [facility]) - Progression Note    Patient Details  Name: Diana Quinn MRN: [id number] Date of Birth: 12/31/1965  Transition of [facility]) CM/SW Contact  Marlowe Sax, RN Phone Number: 09/17/2022, 11:05 AM  Clinical Narrative:     No bed offers at this time, resent out the bedsearch again thru the whole HUB, sent messages asking facilities to review for a bed  Expected Discharge Plan:  (tbd) Barriers to Discharge: Continued Medical Work up  Expected Discharge Plan and Services   Discharge Planning Services: CM Consult   Living arrangements for the past 2 months: Single Family Home                                       Social Determinants of Health (SDOH) Interventions SDOH Screenings   Food Insecurity: No Food Insecurity (09/10/2022)  Housing: Low Risk  (09/10/2022)  Transportation Needs: No Transportation Needs (09/10/2022)  Utilities: Not At Risk (09/10/2022)  Depression (PHQ2-9): Low Risk  (10/26/2018)  Tobacco Use: Low Risk  (09/12/2022)    Readmission Risk Interventions     No data to display

## 2022-09-17 NOTE — Progress Notes (Signed)
Physical Therapy Treatment Patient Details Name: Diana Quinn MRN: [id number] DOB: 06-13-1965 Today's Date: 09/17/2022   History of Present Illness Pt is a 57 y/o F admitted on 09/09/22 after presenting with c/o fall, rolling both ankles inward & hearing a pop. Pt found to have L distal tibial fx nondisplaced & R nondisplaced ankle fx. Podiatry currently recommending conservative care & NWB BLE x 4-6 weeks. PMH: PTSD, bipolar schizophrenia, anxiety, osteoporosis, DM2, CAD s/p MI with non obstructive CAD on cath, seizure disorder, TIA, DJD    PT Comments  Pt was long sitting in bed upon arrival. She is A and O x 4 and agreeable to session. Author assisted with proper fitting of CAM boot RLE. Most of session focused on transfers OOB > W/C. Used slide board for safety. Min assist to safely transfer. Increased time and vsc throughout. Author educated pt on parts of w/c and how to manage. She required min assist at first but was able to progress to supervision only. She was educated on need of ramp for home entry if unable to go to STR. Pt continues to progress but still very limited due to BLW NWB. Acute PT will continue to follow and progress per current POC.     Assistance Recommended at Discharge Frequent or constant Supervision/Assistance  If plan is discharge home, recommend the following:  Can travel by private vehicle    Assist for transportation;Assistance with cooking/housework;Help with stairs or ramp for entrance;A little help with walking and/or transfers;A little help with bathing/dressing/bathroom      Equipment Recommendations  Hospital bed;Wheelchair (measurements PT);BSC/3in1;Other (comment) (drop arm BSC)       Precautions / Restrictions Precautions Precautions: Fall Restrictions Weight Bearing Restrictions: Yes RLE Weight Bearing: Non weight bearing LLE Weight Bearing: Non weight bearing     Mobility  Bed Mobility Overal bed mobility: Needs Assistance Bed Mobility:  Supine to Sit  Supine to sit: Modified independent (Device/Increase time) Sit to supine: Modified independent (Device/Increase time)     Transfers Overall transfer level: Needs assistance Equipment used: Sliding board Transfers: Bed to chair/wheelchair/BSC   Lateral/Scoot Transfers: From elevated surface, With slide board, Min assist General transfer comment: Pt was able to transfer from EOB to w/c via slideboard btu required Vcs for handplacement, posterior wt shift, and sequencing.    Ambulation/Gait    General Gait Details: Unable due to BLE NWB   Merchant navy officer mobility: Yes Wheelchair propulsion: Both upper extremities Wheelchair parts: Supervision/cueing Distance: 200 Wheelchair Assistance Details (indicate cue type and reason): Lengthy education on w/c parts, safety, and how to self propel . She required assistance at first btu was able to progress to supervision only. Vcs for reminders to low brakes.     Balance Overall balance assessment: Needs assistance Sitting-balance support: Feet unsupported, Bilateral upper extremity supported Sitting balance-Leahy Scale: Good           Cognition Arousal/Alertness: Awake/alert Behavior During Therapy: WFL for tasks assessed/performed Overall Cognitive Status: Within Functional Limits for tasks assessed    General Comments: Pt is A and O x 4. Agreeable to session and pleasant throughout.           General Comments General comments (skin integrity, edema, etc.): Pt unaware she will be NWB for weeks. encouraged her to think about getting ramp at home so she will be able to return home form STR/SNF quicker      Pertinent Vitals/Pain Pain Assessment Pain Assessment: 0-10 Pain Score: 6  Pain Location:  LLE > RLE Pain Descriptors / Indicators: Guarding, Discomfort Pain Intervention(s): Limited activity within patient's tolerance, Monitored during session, Repositioned, Patient requesting  pain meds-RN notified     PT Goals (current goals can now be found in the care plan section) Acute Rehab PT Goals Patient Stated Goal: decreased pain, get better Progress towards PT goals: Progressing toward goals    Frequency    7X/week      PT Plan Current plan remains appropriate    Co-evaluation     PT goals addressed during session: Mobility/safety with mobility;Balance;Strengthening/ROM        AM-PAC PT "6 Clicks" Mobility   Outcome Measure  Help needed turning from your back to your side while in a flat bed without using bedrails?: None Help needed moving from lying on your back to sitting on the side of a flat bed without using bedrails?: None Help needed moving to and from a bed to a chair (including a wheelchair)?: A Little Help needed standing up from a chair using your arms (e.g., wheelchair or bedside chair)?: Total Help needed to walk in hospital room?: Total Help needed climbing 3-5 steps with a railing? : Total 6 Click Score: 14    End of Session   Activity Tolerance: Patient tolerated treatment well Patient left: in chair;with call bell/phone within reach;Other (comment) (in w/c with all needs in reach.) Nurse Communication: Mobility status PT Visit Diagnosis: Muscle weakness (generalized) (M62.81);History of falling (Z91.81);Other abnormalities of gait and mobility (R26.89);Pain Pain - Right/Left: Left Pain - part of body: Ankle and joints of foot     Time: 1001-1025 PT Time Calculation (min) (ACUTE ONLY): 24 min  Charges:    $Therapeutic Activity: 8-22 mins $Wheel Chair Management: 8-22 mins PT General Charges $$ ACUTE PT VISIT: 1 Visit                    Jetta Lout PTA 09/17/22, 11:37 AM

## 2022-09-18 ENCOUNTER — Ambulatory Visit: Payer: MEDICAID | Admitting: Nurse Practitioner

## 2022-09-18 DIAGNOSIS — B9620 Unspecified Escherichia coli [E. coli] as the cause of diseases classified elsewhere: Secondary | ICD-10-CM | POA: Diagnosis not present

## 2022-09-18 DIAGNOSIS — S82302D Unspecified fracture of lower end of left tibia, subsequent encounter for closed fracture with routine healing: Secondary | ICD-10-CM | POA: Diagnosis not present

## 2022-09-18 DIAGNOSIS — N390 Urinary tract infection, site not specified: Secondary | ICD-10-CM | POA: Diagnosis not present

## 2022-09-18 DIAGNOSIS — F331 Major depressive disorder, recurrent, moderate: Secondary | ICD-10-CM | POA: Diagnosis not present

## 2022-09-18 HISTORY — DX: Urinary tract infection, site not specified: B96.20

## 2022-09-18 LAB — GLUCOSE, CAPILLARY
Glucose-Capillary: 113 mg/dL — ABNORMAL HIGH (ref 70–99)
Glucose-Capillary: 119 mg/dL — ABNORMAL HIGH (ref 70–99)
Glucose-Capillary: 110 mg/dL — ABNORMAL HIGH (ref 70–99)
Glucose-Capillary: 113 mg/dL — ABNORMAL HIGH (ref 70–99)

## 2022-09-18 LAB — BASIC METABOLIC PANEL
Anion gap: 8 (ref 5–15)
BUN: 10 mg/dL (ref 6–20)
CO2: 22 mmol/L (ref 22–32)
Calcium: 7.7 mg/dL — ABNORMAL LOW (ref 8.9–10.3)
Chloride: 100 mmol/L (ref 98–111)
Creatinine, Ser: 0.58 mg/dL (ref 0.44–1.00)
GFR, Estimated: >60 mL/min (ref >60)
Glucose, Bld: 84 mg/dL (ref 70–99)
Potassium: 4 mmol/L (ref 3.5–5.1)
Sodium: 130 mmol/L — ABNORMAL LOW (ref 135–145)

## 2022-09-18 LAB — CBC
HCT: 32.6 % — ABNORMAL LOW (ref 36.0–46.0)
Hemoglobin: 11 g/dL — ABNORMAL LOW (ref 12.0–15.0)
MCH: 32.4 pg (ref 26.0–34.0)
MCHC: 33.7 g/dL (ref 30.0–36.0)
MCV: 95.9 fL (ref 80.0–100.0)
Platelets: 225 10*3/uL (ref 150–400)
RBC: 3.4 MIL/uL — ABNORMAL LOW (ref 3.87–5.11)
RDW: 13.9 % (ref 11.5–15.5)
WBC: 6.8 10*3/uL (ref 4.0–10.5)
nRBC: 0 % (ref 0.0–0.2)

## 2022-09-18 LAB — URINE CULTURE

## 2022-09-18 MED ORDER — NITROFURANTOIN MONOHYD MACRO 100 MG PO CAPS
100.0000 mg | ORAL_CAPSULE | Freq: Two times a day (BID) | ORAL | Status: AC
  Administered 2022-09-18 – 2022-09-20 (×6): 100 mg via ORAL
  Filled 2022-09-18 (×6): qty 1

## 2022-09-18 NOTE — Progress Notes (Signed)
   09/18/22 1400  Spiritual Encounters  Type of Visit Follow up  Care provided to: Patient  Referral source Chaplain assessment;Other (comment) (acp documents)  Reason for visit Advance directives (Discrepancy in document needs to be addressed)  OnCall Visit Yes   Chaplain followed up with patient regarding date discrepancy on advanced care planning document.  Chaplain to follow up with Notary and complete again as directed. This Chaplain will continue to follow.

## 2022-09-18 NOTE — Progress Notes (Signed)
Progress Note   Patient: Diana Quinn NWG:[id number] DOB: Jan 03, 1966 DOA: 09/09/2022     8 DOS: the patient was seen and examined on 09/18/2022   Brief hospital course: 57 y.o. female with medical history significant of  PTSD/Bipolar/Schizophrenia, anxiety,Osteoporosis, DMII with history of hypogylcemia, CAD s/p MI with non-obstructive CAD on Cath per patient, Seizure d/o djd for the back , TIA , hx of gastric bypass for treatment of obesity.  She is admitted for nondisplaced medial malleolus and distal lateral calcaneus fracture s/p mechanical fall   Patient is seen by podiatry, not a surgical candidate.  But deemed to be nonweightbearing for bilateral legs.  Currently patient controlled with pain medicine, pending nursing home placement.   Principal Problem:   Ankle fracture Active Problems:   Closed fracture of left distal tibia   Major depressive disorder, recurrent episode, moderate (HCC)   Hyponatremia   Assessment and Plan:  Acute metabolic encephalopathy E  Coli UTI CT chest negative for PE, chest x-ray negative.  ABG within normal limit Patient was found to be confused, disoriented clammy and restless.  She was very tremulous and behavior concerning for drug intoxication/withdrawal on 7/5 Nursing found a bottle of Percocet, Zanaflex, Seroquel, Advil in her purse.  Unsure if she over ingested any of this or something else.  She did receive 1 dose of Compazine and Dilaudid.  Difficult to know if she reacted to this Discontinued safety sitter She is back to her normal baseline mental status in few hours on 7/5.  Appreciate psych input.  Discontinued Valium.  Continue Caplyta and Seroquel Patient had a abnormal urine with urine culture grew E. coli.  However, patient mental status has improved.  Urine culture finalized, resistant to Keflex, will treat with 3 days of Macrobid.     Left distal tibial fracture/nondisplaced and right nondisplaced ankle fracture -Appreciate podiatry  input Conservative management plan.  Nonweightbearing bilaterally for 4 to 6 weeks followed by weightbearing as tolerated in cam boot -Patient had developed posterior heel superficial wound/pressure blister due to inadequate padding in the posterior splint.  Podiatry has changed the dressing yesterday on 7/8 with extra cast padding on the left heel -She will need twice a week splint dressing changes to the left lower extremity.  Xeroform over wound followed by cast padding times extra padding under heel per podiatry - will need splint dressing changes at SNF.      PTSD/Bipolar/Anxiety  -Continue Paxil, Seroquel, trazodone, Caplyta Seen by psychiatry.  Continue Caplyta and Seroquel.  Valium discontinued    Hypotension Transient and resolved now   Acute urinary retention  Foley catheter placed on 7/5, removed on 7/7   DMII -Sliding scale insulin for now - A1c 5.5   CAD s/p MI  -CAD non-obstructive but per patient cath noted small caliber vessels  -continue ranxea   DJD of the back  -chronic back pain  -follows with pcp and pain management    TIA hx    Obesity  -s/p gastric bypass    Hx if Seizure d/o  -on trileptal 600 mg twice daily.       Subjective:  Patient doing well today, no complaint.  Physical Exam: Vitals:   09/17/22 1508 09/17/22 2127 09/18/22 0500 09/18/22 0731  BP: 124/77 107/63  (!) 114/54  Pulse: 72 71  82  Resp: 16 20  15   Temp: 98.4 F (36.9 C) (!) 97.3 F (36.3 C)  97.7 F (36.5 C)  TempSrc:    Oral  SpO2:  96% 100%  97%  Weight:   93.3 kg   Height:       General exam: Appears calm and comfortable  Respiratory system: Clear to auscultation. Respiratory effort normal. Cardiovascular system: S1 & S2 heard, RRR. No JVD, murmurs, rubs, gallops or clicks. No pedal edema. Gastrointestinal system: Abdomen is nondistended, soft and nontender. No organomegaly or masses felt. Normal bowel sounds heard. Central nervous system: Alert and oriented. No  focal neurological deficits. Extremities: Symmetric 5 x 5 power. Skin: No rashes, lesions or ulcers Psychiatry: Judgement and insight appear normal. Mood & affect appropriate.    Data Reviewed:  Lab results reviewed.  Family Communication: None  Disposition: Status is: Inpatient Remains inpatient appropriate because: Unsafe discharge.     Time spent: 35 minutes  Author: Marrion Coy, MD 09/18/2022 2:56 PM  For on call review [email or web address].

## 2022-09-18 NOTE — Progress Notes (Signed)
Physical Therapy Treatment Patient Details Name: Diana Quinn MRN: [id number] DOB: [birth year] Today's Date: 09/18/2022   History of Present Illness Pt is a 57 y/o F admitted on 09/09/22 after presenting with c/o fall, rolling both ankles inward & hearing a pop. Pt found to have L distal tibial fx nondisplaced & R nondisplaced ankle fx. Podiatry currently recommending conservative care & NWB BLE x 4-6 weeks. PMH: PTSD, bipolar schizophrenia, anxiety, osteoporosis, DM2, CAD s/p MI with non obstructive CAD on cath, seizure disorder, TIA, DJD    PT Comments  Pt was long sitting in bed upon arrival. She is A and O x 4 and agreeable to session. Discussed lack of bed offers and need for starting to plan for back up plan if unable to DC to rehab. Discussed at length ramp being built at home for home entry. Pt reports she will ask her son next time they talk. Pt was able to safely exit bed posteriorly into w/c with vcs and CGA. She demonstrated improved w/c management today but still requires occasional assistance to prevent hitting obstacles towards the R. Pt tends to drift R. Overall tolerated session well. Acute PT will continue to follow per current POC.     Assistance Recommended at Discharge Frequent or constant Supervision/Assistance  If plan is discharge home, recommend the following:  Can travel by private vehicle    Help with stairs or ramp for entrance;Assist for transportation;Direct supervision/assist for financial management;Direct supervision/assist for medications management;Assistance with cooking/housework;A little help with bathing/dressing/bathroom;A little help with walking and/or transfers      Equipment Recommendations  Hospital bed;Wheelchair (measurements PT);BSC/3in1;Other (comment);Wheelchair cushion (measurements PT) (Drop arm BSC. Ramp.)       Precautions / Restrictions Precautions Precautions: Fall Restrictions Weight Bearing Restrictions: Yes RLE Weight Bearing: Non  weight bearing LLE Weight Bearing: Non weight bearing     Mobility  Bed Mobility Overal bed mobility: Modified Independent  General bed mobility comments: Pt was able to posteriorly exit bed > W/C but required equipment setup and placement. Vcs for improved technique and handplacement    Transfers Overall transfer level: Needs assistance Equipment used:  (w/c) Transfers: Bed to chair/wheelchair/BSC  Anterior-Posterior transfers: Min guard General transfer comment: Pt was able to posteriorly transfer into w/c with CGA for safety. vcs for handplacement and technique improvements.    Ambulation/Gait  General Gait Details: Currently unable due to NWB BLEs.  Merchant navy officer mobility: Yes Wheelchair propulsion: Both upper extremities Wheelchair parts: Supervision/cueing Distance: 400+     Balance Overall balance assessment: Needs assistance Sitting-balance support: Feet unsupported, Bilateral upper extremity supported Sitting balance-Leahy Scale: Good  Standing balance comment: Unable to assess       Cognition Arousal/Alertness: Awake/alert Behavior During Therapy: WFL for tasks assessed/performed Overall Cognitive Status: Within Functional Limits for tasks assessed    General Comments: Pt is A and O x 4. Agreeable to session and pleasant throughout.           General Comments General comments (skin integrity, edema, etc.): lengthy discussion about TOC lack of bed offers at this time. discussed ramp entry to home. pt reports she has son who could/would build her ramp if recommended.      Pertinent Vitals/Pain Pain Assessment Pain Assessment: 0-10 Pain Score: 6  Pain Location: LLE > RLE Pain Descriptors / Indicators: Guarding, Discomfort Pain Intervention(s): Limited activity within patient's tolerance, Monitored during session, Premedicated before session, Repositioned     PT Goals (current goals can now be found  in the care plan  section) Acute Rehab PT Goals Patient Stated Goal: get better so I can go home Progress towards PT goals: Progressing toward goals    Frequency    7X/week      PT Plan Current plan remains appropriate    Co-evaluation     PT goals addressed during session: Mobility/safety with mobility;Balance;Strengthening/ROM;Proper use of DME        AM-PAC PT "6 Clicks" Mobility   Outcome Measure  Help needed turning from your back to your side while in a flat bed without using bedrails?: None Help needed moving from lying on your back to sitting on the side of a flat bed without using bedrails?: None Help needed moving to and from a bed to a chair (including a wheelchair)?: A Little Help needed standing up from a chair using your arms (e.g., wheelchair or bedside chair)?: Total Help needed to walk in hospital room?: Total Help needed climbing 3-5 steps with a railing? : Total 6 Click Score: 14    End of Session   Activity Tolerance: Patient tolerated treatment well Patient left: in chair;with call bell/phone within reach;Other (comment) (in w/c at conclusion of session. RN aware.) Nurse Communication: Mobility status PT Visit Diagnosis: Muscle weakness (generalized) (M62.81);History of falling (Z91.81);Other abnormalities of gait and mobility (R26.89);Pain Pain - Right/Left: Left Pain - part of body: Ankle and joints of foot     Time: 1345-1406 PT Time Calculation (min) (ACUTE ONLY): 21 min  Charges:    $Wheel Chair Management: 8-22 mins PT General Charges $$ ACUTE PT VISIT: 1 Visit                    Jetta Lout PTA 09/18/22, 5:02 PM

## 2022-09-18 NOTE — Plan of Care (Signed)
Problem: Education: Goal: Knowledge of General Education information will improve Description: Including pain rating scale, medication(s)/side effects and non-pharmacologic comfort measures 09/18/2022 0515 by Arlys John, RN Outcome: Progressing 09/17/2022 2355 by Arlys John, RN Outcome: Progressing   Problem: Health Behavior/Discharge Planning: Goal: Ability to manage health-related needs will improve 09/18/2022 0515 by Arlys John, RN Outcome: Progressing 09/17/2022 2355 by Arlys John, RN Outcome: Progressing   Problem: Clinical Measurements: Goal: Ability to maintain clinical measurements within normal limits will improve 09/18/2022 0515 by Arlys John, RN Outcome: Progressing 09/17/2022 2355 by Arlys John, RN Outcome: Progressing Goal: Will remain free from infection 09/18/2022 0515 by Arlys John, RN Outcome: Progressing 09/17/2022 2355 by Arlys John, RN Outcome: Progressing Goal: Diagnostic test results will improve 09/18/2022 0515 by Arlys John, RN Outcome: Progressing 09/17/2022 2355 by Arlys John, RN Outcome: Progressing Goal: Respiratory complications will improve 09/18/2022 0515 by Arlys John, RN Outcome: Progressing 09/17/2022 2355 by Arlys John, RN Outcome: Progressing Goal: Cardiovascular complication will be avoided 09/18/2022 0515 by Arlys John, RN Outcome: Progressing 09/17/2022 2355 by Arlys John, RN Outcome: Progressing   Problem: Activity: Goal: Risk for activity intolerance will decrease 09/18/2022 0515 by Arlys John, RN Outcome: Progressing 09/17/2022 2355 by Arlys John, RN Outcome: Progressing   Problem: Nutrition: Goal: Adequate nutrition will be maintained 09/18/2022 0515 by Arlys John, RN Outcome: Progressing 09/17/2022 2355 by Arlys John,  RN Outcome: Progressing   Problem: Coping: Goal: Level of anxiety will decrease 09/18/2022 0515 by Arlys John, RN Outcome: Progressing 09/17/2022 2355 by Arlys John, RN Outcome: Progressing   Problem: Elimination: Goal: Will not experience complications related to bowel motility 09/18/2022 0515 by Arlys John, RN Outcome: Progressing 09/17/2022 2355 by Arlys John, RN Outcome: Progressing Goal: Will not experience complications related to urinary retention 09/18/2022 0515 by Arlys John, RN Outcome: Progressing 09/17/2022 2355 by Arlys John, RN Outcome: Progressing   Problem: Pain Managment: Goal: General experience of comfort will improve 09/18/2022 0515 by Arlys John, RN Outcome: Progressing 09/17/2022 2355 by Arlys John, RN Outcome: Progressing   Problem: Safety: Goal: Ability to remain free from injury will improve 09/18/2022 0515 by Arlys John, RN Outcome: Progressing 09/17/2022 2355 by Arlys John, RN Outcome: Progressing   Problem: Skin Integrity: Goal: Risk for impaired skin integrity will decrease 09/18/2022 0515 by Arlys John, RN Outcome: Progressing 09/17/2022 2355 by Arlys John, RN Outcome: Progressing   Problem: Education: Goal: Ability to describe self-care measures that may prevent or decrease complications (Diabetes Survival Skills Education) will improve 09/18/2022 0515 by Arlys John, RN Outcome: Progressing 09/17/2022 2355 by Arlys John, RN Outcome: Progressing Goal: Individualized Educational Video(s) 09/18/2022 0515 by Arlys John, RN Outcome: Progressing 09/17/2022 2355 by Arlys John, RN Outcome: Progressing   Problem: Coping: Goal: Ability to adjust to condition or change in health will improve 09/18/2022 0515 by Arlys John, RN Outcome: Progressing 09/17/2022  2355 by Arlys John, RN Outcome: Progressing   Problem: Fluid Volume: Goal: Ability to maintain a balanced intake and output will improve 09/18/2022 0515 by Arlys John, RN Outcome: Progressing 09/17/2022 2355 by Arlys John, RN Outcome: Progressing   Problem: Health Behavior/Discharge Planning: Goal: Ability to identify and utilize available resources and services will improve 09/18/2022 0515 by Arlys John, RN Outcome: Progressing 09/17/2022 2355 by Arlys John,  RN Outcome: Progressing Goal: Ability to manage health-related needs will improve 09/18/2022 0515 by Arlys John, RN Outcome: Progressing 09/17/2022 2355 by Arlys John, RN Outcome: Progressing   Problem: Metabolic: Goal: Ability to maintain appropriate glucose levels will improve 09/18/2022 0515 by Arlys John, RN Outcome: Progressing 09/17/2022 2355 by Arlys John, RN Outcome: Progressing   Problem: Nutritional: Goal: Maintenance of adequate nutrition will improve 09/18/2022 0515 by Arlys John, RN Outcome: Progressing 09/17/2022 2355 by Arlys John, RN Outcome: Progressing Goal: Progress toward achieving an optimal weight will improve 09/18/2022 0515 by Arlys John, RN Outcome: Progressing 09/17/2022 2355 by Arlys John, RN Outcome: Progressing   Problem: Skin Integrity: Goal: Risk for impaired skin integrity will decrease 09/18/2022 0515 by Arlys John, RN Outcome: Progressing 09/17/2022 2355 by Arlys John, RN Outcome: Progressing   Problem: Tissue Perfusion: Goal: Adequacy of tissue perfusion will improve 09/18/2022 0515 by Arlys John, RN Outcome: Progressing 09/17/2022 2355 by Arlys John, RN Outcome: Progressing

## 2022-09-18 NOTE — Plan of Care (Signed)

## 2022-09-18 NOTE — Progress Notes (Addendum)
PT Cancellation Note  Patient Details Name: Diana Quinn MRN: [id number] DOB: 05/27/1965   Cancelled Treatment:     PT attempt (2nd attempt today), pt requested author return this afternoon around 3pm. Will return later this date per pt request.   Rushie Chestnut 09/18/2022, 11:25 AM

## 2022-09-19 DIAGNOSIS — N390 Urinary tract infection, site not specified: Secondary | ICD-10-CM | POA: Diagnosis not present

## 2022-09-19 DIAGNOSIS — B9620 Unspecified Escherichia coli [E. coli] as the cause of diseases classified elsewhere: Secondary | ICD-10-CM | POA: Diagnosis not present

## 2022-09-19 DIAGNOSIS — F331 Major depressive disorder, recurrent, moderate: Secondary | ICD-10-CM | POA: Diagnosis not present

## 2022-09-19 DIAGNOSIS — S82302A Unspecified fracture of lower end of left tibia, initial encounter for closed fracture: Secondary | ICD-10-CM | POA: Diagnosis not present

## 2022-09-19 LAB — GLUCOSE, CAPILLARY
Glucose-Capillary: 85 mg/dL (ref 70–99)
Glucose-Capillary: 106 mg/dL — ABNORMAL HIGH (ref 70–99)
Glucose-Capillary: 98 mg/dL (ref 70–99)
Glucose-Capillary: 92 mg/dL (ref 70–99)

## 2022-09-19 NOTE — Plan of Care (Signed)

## 2022-09-19 NOTE — TOC Progression Note (Addendum)
Transition of Care [facility]) - Progression Note    Patient Details  Name: Diana Quinn MRN: [id number] Date of Birth: [birth year]  [facility] CM/SW Contact  Marlowe Sax, RN Phone Number: 09/19/2022, 1:21 PM  Clinical Narrative:    Met with the patient to discuss dc plan and needs She stated that she has been living with her son and daughter in law and grand children until she gets her own apartment She stated that her steps are very steep going into the home at the front door, and her son can't build a ramp due to how steep they are,  asked about the back door, she stated that there are 2 steps but it is all the way thru the back yard I explained that she will NOT be going to STR because we have had no bed offers likely due to the type of medicaid she has.  I explained that I have sent the bed search multiple times and there are no bed offers I explained that our PT department is wanting to work with the patient and with family present working on stairs and how to manage them safely, I explained that I can get a wheelchair a 3 in1 and a transfer board for her to go home with, she has been working with PT here at the hospital transferring to [facility] and wheelchair already, I explained that Medicaid has Transport to go to appointments that she will have to set up with her Careers information officer I explained that EMS can be arranged to get her into her home but we want her family to come to learn how to manage stairs safely, she stated that she is currently sleeping in the living room on an air mattress and has no privacy, I explained that this is temporary and once healed she will be able to get around better and have more privacy, I explained that I do not have any resources to help with housing that since she currently resides with her son that would need to be the plan to go to his house at DC as well, she stated understanding she stated that her daughter in law is the one at home and she has  an 70 year old grand daughter a 19 year old and 6 year old grand sons, her son works noon til 2 AM as a Naval architect.  I stated that possibly the DIL and Grandchildren could help also, She stated that the 55 year old is moving out, I said that maybe  she can temporarily use their room and that would solve the privacy issue,  She stated that her family can not lift her, I explained noone needs to lift her and only needs to provide assistance,  She mentioned that she has been working well here at the hospital with PT and has been going to the [facility] and getting to the [facility] and back to bed with min assistance.  I explained to the patient that we need to formulate a plan and determine what she needs because we need to work toward going home, the plan is to DC Monday, she has been working with PT with little assistance , TOC will work on getting a WC, BSC and transfer board to take home She stated agreement    Expected Discharge Plan:  (tbd) Barriers to Discharge: Continued Medical Work up  Expected Discharge Plan and Services   Discharge Planning Services: CM Consult   Living arrangements for the past 2  months: Single Family Home                                       Social Determinants of Health (SDOH) Interventions SDOH Screenings   Food Insecurity: No Food Insecurity (09/10/2022)  Housing: Low Risk  (09/10/2022)  Transportation Needs: No Transportation Needs (09/10/2022)  Utilities: Not At Risk (09/10/2022)  Depression (PHQ2-9): Low Risk  (10/26/2018)  Tobacco Use: Low Risk  (09/12/2022)    Readmission Risk Interventions     No data to display

## 2022-09-19 NOTE — Progress Notes (Signed)
Progress Note   Patient: Diana Quinn ZOX:[id number] DOB: [birth year] DOA: 09/09/2022     9 DOS: the patient was seen and examined on 09/19/2022   Brief hospital course: 57 y.o. female with medical history significant of  PTSD/Bipolar/Schizophrenia, anxiety,Osteoporosis, DMII with history of hypogylcemia, CAD s/p MI with non-obstructive CAD on Cath per patient, Seizure d/o djd for the back , TIA , hx of gastric bypass for treatment of obesity.  She is admitted for nondisplaced medial malleolus and distal lateral calcaneus fracture s/p mechanical fall   Patient is seen by podiatry, not a surgical candidate.  But deemed to be nonweightbearing for bilateral legs.  Currently patient controlled with pain medicine, pending nursing home placement.   Principal Problem:   Ankle fracture Active Problems:   Closed fracture of left distal tibia   Major depressive disorder, recurrent episode, moderate (HCC)   Hyponatremia   E. coli UTI   Assessment and Plan:  Acute metabolic encephalopathy E  Coli UTI CT chest negative for PE, chest x-ray negative.  ABG within normal limit Patient was found to be confused, disoriented clammy and restless.  She was very tremulous and behavior concerning for drug intoxication/withdrawal on 7/5 Nursing found a bottle of Percocet, Zanaflex, Seroquel, Advil in her purse.  Unsure if she over ingested any of this or something else.  She did receive 1 dose of Compazine and Dilaudid.  Difficult to know if she reacted to this Discontinued safety sitter She is back to her normal baseline mental status in few hours on 7/5.  Appreciate psych input.  Discontinued Valium.  Continue Caplyta and Seroquel Patient had a abnormal urine with urine culture grew E. coli.  However, patient mental status has improved.  Urine culture finalized, resistant to Keflex, will treat with 3 days of Macrobid.     Left distal tibial fracture/nondisplaced and right nondisplaced ankle  fracture -Appreciate podiatry input Conservative management plan.  Nonweightbearing bilaterally for 4 to 6 weeks followed by weightbearing as tolerated in cam boot -Patient had developed posterior heel superficial wound/pressure blister due to inadequate padding in the posterior splint.  Podiatry has changed the dressing yesterday on 7/8 with extra cast padding on the left heel -She will need twice a week splint dressing changes to the left lower extremity.  Xeroform over wound followed by cast padding times extra padding under heel per podiatry - will need splint dressing changes at SNF.      PTSD/Bipolar/Anxiety  -Continue Paxil, Seroquel, trazodone, Caplyta Seen by psychiatry.  Continue Caplyta and Seroquel.  Valium discontinued    Hypotension Transient and resolved now   Acute urinary retention  Foley catheter placed on 7/5, removed on 7/7   DMII -Sliding scale insulin for now - A1c 5.5   CAD s/p MI  -CAD non-obstructive but per patient cath noted small caliber vessels  -continue ranxea   DJD of the back  -chronic back pain  -follows with pcp and pain management    TIA hx    Obesity  -s/p gastric bypass    Hx if Seizure d/o  -on trileptal 600 mg twice daily.      Patient condition stable, no change in treatment plan.    Subjective:  Still complains bilateral foot pain, otherwise condition stable.  Physical Exam: Vitals:   09/18/22 1539 09/18/22 2158 09/18/22 2332 09/19/22 0745  BP: 115/61 (!) 106/57 (!) 111/58 121/64  Pulse: 71 76 82 72  Resp: 15 16 20 17   Temp: 97.7 F (36.5 C)  98.2 F (36.8 C) 98 F (36.7 C) 98.4 F (36.9 C)  TempSrc:      SpO2: 93% 100% 93% 97%  Weight:      Height:       General exam: Appears calm and comfortable  Respiratory system: Clear to auscultation. Respiratory effort normal. Cardiovascular system: S1 & S2 heard, RRR. No JVD, murmurs, rubs, gallops or clicks. No pedal edema. Gastrointestinal system: Abdomen is  nondistended, soft and nontender. No organomegaly or masses felt. Normal bowel sounds heard. Central nervous system: Alert and oriented. No focal neurological deficits. Extremities: Symmetric 5 x 5 power. Skin: No rashes, lesions or ulcers Psychiatry: Judgement and insight appear normal. Mood & affect appropriate.    Data Reviewed:  There are no new results to review at this time.  Family Communication: None  Disposition: Status is: Inpatient Remains inpatient appropriate because: Unsafe discharge option.     Time spent: 25 minutes  Author: Marrion Coy, MD 09/19/2022 12:53 PM  For on call review [email or web address].

## 2022-09-19 NOTE — Progress Notes (Signed)
Physical Therapy Treatment Patient Details Name: Bobbiesue Verstraete MRN: [id number] DOB: 1965-10-21 Today's Date: 09/19/2022   History of Present Illness Pt is a 57 y/o F admitted on 09/09/22 after presenting with c/o fall, rolling both ankles inward & hearing a pop. Pt found to have L distal tibial fx nondisplaced & R nondisplaced ankle fx. Podiatry currently recommending conservative care & NWB BLE x 4-6 weeks. 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 She remains cooperative and pleasant but continues to be most limited by wt bearing restrictions. Pt requested not to get into WC this morning but did the past two days. She was agreeable to OOB to recliner. Author assist pt with proper placement of R CAM boot. LLE splint not in proper locations. Author re-wrapped LLE with splint with better fit noted.Pt states," The podiatrist told me they would change the splint on Wednesday but never did. I'm suppose to get a cast at some point." Pt was able to safely exit R side of bed to recliner with only set up assist. No physical assistance required to perform transfer. Pt also issued HEP seated and supine exercises. She is able to I'ly perform. Lengthy discussion about DC disposition if unable to find STR bed. Pt reports that her son will be unable to build ramp due to location of stairs and steepness  of stairs. If TOC still unable to find a rehab bed, pt is encouraged to set up a time for caregivers/family to come in for training to learn how to perform stairs by bumping up/down in w/c. Currently no bed offers. Acute PT will continue to follower current POC.      Assistance Recommended at Discharge Frequent or constant Supervision/Assistance  If plan is discharge home, recommend the following:  Can travel by private vehicle    Help with stairs or ramp for  entrance;Assist for transportation;Direct supervision/assist for financial management;Direct supervision/assist for medications management;Assistance with cooking/housework;A little help with bathing/dressing/bathroom;A little help with walking and/or transfers      Equipment Recommendations  Hospital bed;Wheelchair (measurements PT);BSC/3in1;Other (comment);Wheelchair cushion (measurements PT) (drop arm BSC, Ramp)       Precautions / Restrictions Precautions Precautions: Fall Restrictions Weight Bearing Restrictions: Yes RLE Weight Bearing: Non weight bearing LLE Weight Bearing: Non weight bearing     Mobility  Bed Mobility Overal bed mobility: Modified Independent  General bed mobility comments: pt demionstarted abilities to move in bed without physical assistance. She does require assistance to properly place CAM boot on RLE.    Transfers Overall transfer level: Needs assistance Equipment used: None Transfers: Bed to chair/wheelchair/BSC  Anterior-Posterior transfers: Modified independent (Device/Increase time) General transfer comment: pt only required equipment set up/placement to safely exit bed to recliner. she was able to exit bed to w/c past two days but requeted not to get in/use WC this date.    Ambulation/Gait  General Gait Details: Currently unable due to NWB BLEs.        Cognition Arousal/Alertness: Awake/alert Behavior During Therapy: WFL for tasks assessed/performed Overall Cognitive Status: Within Functional Limits for tasks assessed  General Comments: Pt is A and O x 4. Agreeable to session and pleasant throughout. discussed at length need for family training for w/c use on stairs if son is unable to build a ramp.  General Comments General comments (skin integrity, edema, etc.): Pt states she will discuss with son a time to come in for training if rehab bed still unable to be aquired.      Pertinent Vitals/Pain Pain Assessment Pain  Assessment: 0-10 Pain Score: 4  Faces Pain Scale: Hurts a little bit Pain Location: LLE > RLE Pain Descriptors / Indicators: Guarding, Discomfort Pain Intervention(s): Limited activity within patient's tolerance, Monitored during session, Premedicated before session, Repositioned     PT Goals (current goals can now be found in the care plan section) Acute Rehab PT Goals Patient Stated Goal: Get better Progress towards PT goals: Progressing toward goals    Frequency    7X/week      PT Plan Current plan remains appropriate    Co-evaluation     PT goals addressed during session: Mobility/safety with mobility;Strengthening/ROM        AM-PAC PT "6 Clicks" Mobility   Outcome Measure  Help needed turning from your back to your side while in a flat bed without using bedrails?: None Help needed moving from lying on your back to sitting on the side of a flat bed without using bedrails?: None Help needed moving to and from a bed to a chair (including a wheelchair)?: A Little Help needed standing up from a chair using your arms (e.g., wheelchair or bedside chair)?: Total Help needed to walk in hospital room?: Total Help needed climbing 3-5 steps with a railing? : Total 6 Click Score: 14    End of Session   Activity Tolerance: Patient tolerated treatment well;Treatment limited secondary to medical complications (Comment) (limited by BLE NWB restrictions) Patient left: in chair;with call bell/phone within reach;Other (comment) Nurse Communication: Mobility status PT Visit Diagnosis: Muscle weakness (generalized) (M62.81);History of falling (Z91.81);Other abnormalities of gait and mobility (R26.89);Pain Pain - Right/Left: Left Pain - part of body: Ankle and joints of foot     Time: 1003-1018 PT Time Calculation (min) (ACUTE ONLY): 15 min  Charges:    $Therapeutic Activity: 8-22 mins PT General Charges $$ ACUTE PT VISIT: 1 Visit                    Jetta Lout  PTA 09/19/22, 10:48 AM

## 2022-09-19 NOTE — Plan of Care (Signed)
  Problem: Education: Goal: Knowledge of General Education information will improve Description: Including pain rating scale, medication(s)/side effects and non-pharmacologic comfort measures Outcome: Progressing   Problem: Health Behavior/Discharge Planning: Goal: Ability to manage health-related needs will improve Outcome: Progressing   Problem: Clinical Measurements: Goal: Diagnostic test results will improve Outcome: Progressing   Problem: Activity: Goal: Risk for activity intolerance will decrease Outcome: Progressing   Problem: Nutrition: Goal: Adequate nutrition will be maintained Outcome: Progressing   Problem: Coping: Goal: Level of anxiety will decrease Outcome: Progressing   Problem: Elimination: Goal: Will not experience complications related to urinary retention Outcome: Progressing   Problem: Pain Managment: Goal: General experience of comfort will improve Outcome: Progressing   

## 2022-09-19 NOTE — Progress Notes (Signed)
Occupational Therapy Treatment Patient Details Name: Diana Quinn MRN: [id number] DOB: 1965/06/24 Today's Date: 09/19/2022   History of present illness Pt is a 57 y/o F admitted on 09/09/22 after presenting with c/o fall, rolling both ankles inward & hearing a pop. Pt found to have L distal tibial fx nondisplaced & R nondisplaced ankle fx. Podiatry currently recommending conservative care & NWB BLE x 4-6 weeks. 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 Upon arrival to room pt resting in bed, agreeable to tx. Pt requires supervision to donn briefs while long seated in bed. Pt reattempted task while seated EOB. While seated EOB, pt demonstrated improved form and adherence to NWB status. Pt reported increased pain after attempts to use LB for bed mobility. Pt educated on monitoring pain, sequencing tasks, and importance of adhering to precautions placed by MD. Pt making good progress toward goals, will continue to follow POC. Discharge recommendation remains appropriate.    Recommendations for follow up therapy are one component of a multi-disciplinary discharge planning process, led by the attending physician.  Recommendations may be updated based on patient status, additional functional criteria and insurance authorization.    Assistance Recommended at Discharge Frequent or constant Supervision/Assistance  Patient can return home with the following  Help with stairs or ramp for entrance;Two people to help with walking and/or transfers;Two people to help with bathing/dressing/bathroom   Equipment Recommendations  Hospital bed    Recommendations for Other Services      Precautions / Restrictions Precautions Precautions: Fall Restrictions Weight Bearing Restrictions: Yes RLE Weight Bearing: Non weight bearing LLE Weight Bearing: Non weight bearing        Mobility Bed Mobility Overal bed mobility: Modified Independent, Needs Assistance Bed Mobility: Supine to Sit     Supine to sit: Supervision, HOB elevated     General bed mobility comments: Attempting to use RLE - verbal cueing for safety    Transfers                         Balance Overall balance assessment: Needs assistance Sitting-balance support: Feet unsupported, Bilateral upper extremity supported Sitting balance-Leahy Scale: Good Sitting balance - Comments: no LOB with scooting today or assist needed                                   ADL either performed or assessed with clinical judgement   ADL Overall ADL's : Needs assistance/impaired                     Lower Body Dressing: Supervision/safety;Bed level;Sitting/lateral leans Lower Body Dressing Details (indicate cue type and reason): Pt performed lower body dressing bed level and sitting EOB with lateral leans. Pt able to respect pain and NWB status with lateral leans while seated.               General ADL Comments: Supervision for LB ADL long seated in bed.    Extremity/Trunk Assessment Upper Extremity Assessment Upper Extremity Assessment: Overall WFL for tasks assessed            Vision       Perception     Praxis      Cognition Arousal/Alertness: Awake/alert Behavior During Therapy: [facility] for tasks assessed/performed  Overall Cognitive Status: Within Functional Limits for tasks assessed                                 General Comments: Difficulty adhering to NWBing status during bed mobility        Exercises      Shoulder Instructions       General Comments Pt states she will discuss with son a time to come in for training if rehab bed still unable to be aquired.    Pertinent Vitals/ Pain       Pain Assessment Pain Assessment: Faces Faces Pain Scale: Hurts little more Pain Location: LLE > RLE Pain Descriptors / Indicators:  Discomfort, Grimacing Pain Intervention(s): Monitored during session, Repositioned  Home Living                                          Prior Functioning/Environment              Frequency  Min 1X/week        Progress Toward Goals  OT Goals(current goals can now be found in the care plan section)  Progress towards OT goals: Progressing toward goals  Acute Rehab OT Goals Patient Stated Goal: to go to rehab OT Goal Formulation: With patient Time For Goal Achievement: 09/24/22 Potential to Achieve Goals: Good ADL Goals Pt Will Perform Lower Body Dressing: with min assist;sitting/lateral leans Pt Will Transfer to Toilet: with min assist;with transfer board;anterior/posterior transfer;bedside commode Pt Will Perform Toileting - Clothing Manipulation and hygiene: with modified independence;sitting/lateral leans  Plan Discharge plan remains appropriate    Co-evaluation        PT goals addressed during session: Mobility/safety with mobility;Strengthening/ROM        AM-PAC OT "6 Clicks" Daily Activity     Outcome Measure   Help from another person eating meals?: None Help from another person taking care of personal grooming?: None Help from another person toileting, which includes using toliet, bedpan, or urinal?: A Lot Help from another person bathing (including washing, rinsing, drying)?: A Lot Help from another person to put on and taking off regular upper body clothing?: A Little Help from another person to put on and taking off regular lower body clothing?: A Lot 6 Click Score: 17    End of Session    OT Visit Diagnosis: Other abnormalities of gait and mobility (R26.89);Muscle weakness (generalized) (M62.81)   Activity Tolerance Patient tolerated treatment well   Patient Left in bed;with call bell/phone within reach   Nurse Communication          Time: 1610-9604 OT Time Calculation (min): 12 min  Charges: OT General Charges $OT  Visit: 1 Visit OT Treatments $Self Care/Home Management : 8-22 mins  Thresa Ross, OTS

## 2022-09-20 DIAGNOSIS — N390 Urinary tract infection, site not specified: Secondary | ICD-10-CM | POA: Diagnosis not present

## 2022-09-20 DIAGNOSIS — B9620 Unspecified Escherichia coli [E. coli] as the cause of diseases classified elsewhere: Secondary | ICD-10-CM | POA: Diagnosis not present

## 2022-09-20 DIAGNOSIS — S82302D Unspecified fracture of lower end of left tibia, subsequent encounter for closed fracture with routine healing: Secondary | ICD-10-CM | POA: Diagnosis not present

## 2022-09-20 DIAGNOSIS — E871 Hypo-osmolality and hyponatremia: Secondary | ICD-10-CM | POA: Diagnosis not present

## 2022-09-20 LAB — GLUCOSE, CAPILLARY
Glucose-Capillary: 98 mg/dL (ref 70–99)
Glucose-Capillary: 117 mg/dL — ABNORMAL HIGH (ref 70–99)
Glucose-Capillary: 107 mg/dL — ABNORMAL HIGH (ref 70–99)
Glucose-Capillary: 89 mg/dL (ref 70–99)

## 2022-09-20 NOTE — Plan of Care (Signed)
  Problem: Education: Goal: Knowledge of General Education information will improve Description: Including pain rating scale, medication(s)/side effects and non-pharmacologic comfort measures Outcome: Progressing   Problem: Health Behavior/Discharge Planning: Goal: Ability to manage health-related needs will improve Outcome: Progressing   Problem: Clinical Measurements: Goal: Ability to maintain clinical measurements within normal limits will improve Outcome: Progressing Goal: Will remain free from infection Outcome: Progressing   Problem: Elimination: Goal: Will not experience complications related to bowel motility Outcome: Progressing   Problem: Pain Managment: Goal: General experience of comfort will improve Outcome: Progressing   Problem: Safety: Goal: Ability to remain free from injury will improve Outcome: Progressing   Problem: Skin Integrity: Goal: Risk for impaired skin integrity will decrease Outcome: Progressing

## 2022-09-20 NOTE — TOC Progression Note (Signed)
Transition of Care [facility]) - Progression Note    Patient Details  Name: Diana Quinn MRN: [id number] Date of Birth: May 07, 1965  [facility] CM/SW Contact  Susa Simmonds, [place] Phone Number: 09/20/2022, 9:36 AM  Clinical Narrative:   CSW spoke with patient per her request. Patient stated that she spoke to her insurance company and was told there are skilled nursing facilities in the area that will take her insurance. CSW explained that multiple requests were sent and the facilities are saying that her insurance isn't in network. Patient stated she has no one to take care of her at home. Patient asked CSW to review SNF again. CSW researched facilities on patients insurance website. CSW sees the same facilities that denied patient.     Expected Discharge Plan:  (tbd) Barriers to Discharge: Continued Medical Work up  Expected Discharge Plan and Services   Discharge Planning Services: CM Consult   Living arrangements for the past 2 months: Single Family Home                                       Social Determinants of Health (SDOH) Interventions SDOH Screenings   Food Insecurity: No Food Insecurity (09/10/2022)  Housing: Low Risk  (09/10/2022)  Transportation Needs: No Transportation Needs (09/10/2022)  Utilities: Not At Risk (09/10/2022)  Depression (PHQ2-9): Low Risk  (10/26/2018)  Tobacco Use: Low Risk  (09/09/2022)    Readmission Risk Interventions     No data to display

## 2022-09-20 NOTE — Progress Notes (Signed)
Occupational Therapy Treatment Patient Details Name: Diana Quinn MRN: [id number] DOB: [birth year] Today's Date: 09/20/2022   History of present illness Pt is a 57 y/o F admitted on 09/09/22 after presenting with c/o fall, rolling both ankles inward & hearing a pop. Pt found to have L distal tibial fx nondisplaced & R nondisplaced ankle fx. Podiatry currently recommending conservative care & NWB BLE x 4-6 weeks. 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  Patient ported some discomfort in the left wrist after transferring.  Patient verbalized fear of having increased difficulty with hand and wrist pain being now more dependent on her hands with transfers.  Patient had bilateral carpal tunnel releases in the past.  Appear the last few months or years some of the symptoms is returning.  Patient reports she is also crochet her but had to stop that because of increased symptoms in the hands the last few months.  Upon assessment patient with decreased active range of motion in flexion extension the left more than the right with discomfort over the volar wrist.  Patient reports 2/10 pain in the left wrist.  Patient do have a history of fracture in the left hand.  Negative for tendinitis as well as Tinel over carpal tunnel.  Right wrist and hand patient positive Lourena Simmonds with tenderness over distal radius.  Positive Tinel over carpal tunnel.  Patient reports 2/10 pain tenderness over the volar wrist and carpal tunnel.  Discussed with patient's different hand positions for transfers.  On soft padded areas of chairs and bed can maybe do fist locking wrist.  Patient to avoid pushing through thumbs and webspaces for tendinitis.  Provided patient with information and recommended bilateral CMC neoprene wraps for support of thumbs as well as  carpal tunnel.  Padding over the carpal tunnel as well as some soft support for wrist.  Patient can benefit from skilled OT services to increase independence in ADLs and in pain-free upper extremity use and transfers in the next few weeks.   Recommendations for follow up therapy are one component of a multi-disciplinary discharge planning process, led by the attending physician.  Recommendations may be updated based on patient status, additional functional criteria and insurance authorization.    Assistance Recommended at Discharge Frequent or constant Supervision/Assistance  Patient can return home with the following  Help with stairs or ramp for entrance;Two people to help with walking and/or transfers;Two people to help with bathing/dressing/bathroom   Equipment Recommendations  Hospital bed    Recommendations for Other Services      Precautions / Restrictions Precautions Precautions: Fall Restrictions Weight Bearing Restrictions: Yes RLE Weight Bearing: Non weight bearing LLE Weight Bearing: Non weight bearing       Mobility Bed Mobility                    Transfers                         Balance                                           ADL either performed or assessed with clinical judgement   ADL  Extremity/Trunk Assessment              Vision       Perception     Praxis      Cognition Arousal/Alertness: Awake/alert Behavior During Therapy: WFL for tasks assessed/performed Overall Cognitive Status: Within Functional Limits for tasks assessed                                          Exercises Other Exercises Other Exercises: Patient report had in the past bilateral carpal tunnel release.  But increase symptoms again in bilateral hands and wrist.  Report had in the past fracture in the left hand. Other Exercises: Patient reports pain  after transfer this morning.  Right wrist patient tender over distal radius with a positive Lourena Simmonds 2/10 with pain with resistance to thumb radial abduction.  Positive Tinel over carpal tunnel.  Wrist active range of motion within functional limits. Other Exercises: Left tenderness over carpal tunnel- decreased wrist flexion extension range of motion-patient added on doing active assisted range of motion to bilateral wrist flexion extension prior to transfers Other Exercises: Discussed with patient's different positions when doing a sliding board transfer.  Not to push up through webspace and thumb extension.  If it is soft surface can maybe do pushing through the fist locking the wrist. Other Exercises: Recommend for patient to maybe get some CMC neoprene wraps to support bilateral thumbs, carpal tunnel as well as wrist the next few weeks when very dependent on pushing through the hands.  Information provided for patient.    Shoulder Instructions       General Comments      Pertinent Vitals/ Pain       Pain Assessment Pain Assessment: 0-10 Pain Score:  (2 /10 volar wrist and hand R, and L wrist)  Home Living Family/patient expects to be discharged to:: Private residence Living Arrangements: Children Available Help at Discharge: Family;Available 24 hours/day Type of Home: House Home Access: Stairs to enter [employer] of Steps: 3 in back, 6 in front   Home Layout: One level     Bathroom Shower/Tub: Tub/shower unit             Additional Comments: Pt recently moved in with her son, daughter-in-law, & their 3 children. Daughter-in-law home throughout the day, son works. Pt was sleeping on air mattress.      Prior Functioning/Environment              Frequency  Min 1X/week        Progress Toward Goals  OT Goals(current goals can now be found in the care plan section)     Acute Rehab OT Goals Patient Stated Goal: to go to rehab OT Goal Formulation:  With patient Time For Goal Achievement: 09/24/22 Potential to Achieve Goals: Good  Plan      Co-evaluation                 AM-PAC OT "6 Clicks" Daily Activity     Outcome Measure                    End of Session    OT Visit Diagnosis: Other abnormalities of gait and mobility (R26.89);Muscle weakness (generalized) (M62.81)   Activity Tolerance Patient tolerated treatment well   Patient Left in bed;with call bell/phone within reach   Nurse Communication  Time: 1610-9604 OT Time Calculation (min): 27 min  Charges: OT General Charges $OT Visit: 1 Visit OT Treatments $Therapeutic Activity: 8-22 mins $Therapeutic Exercise: 8-22 mins   Jesusa Stenerson OTR/L,CLT 09/20/2022, 1:24 PM

## 2022-09-20 NOTE — Plan of Care (Signed)

## 2022-09-20 NOTE — Progress Notes (Signed)
Physical Therapy Treatment Patient Details Name: Diana Quinn MRN: [id number] DOB: May 26, 1965 Today's Date: 09/20/2022   History of Present Illness Pt is a 57 y/o F admitted on 09/09/22 after presenting with c/o fall, rolling both ankles inward & hearing a pop. Pt found to have L distal tibial fx nondisplaced & R nondisplaced ankle fx. Podiatry currently recommending conservative care & NWB BLE x 4-6 weeks. PMH: PTSD, bipolar schizophrenia, anxiety, osteoporosis, DM2, CAD s/p MI with non obstructive CAD on cath, seizure disorder, TIA, DJD    PT Comments  Skilled PT session continued to focus on LE strengthening and transfer safety and body mechanics.  Pt did use the slide board for lateral/scoot transfer from bed to recliner after reporting that the transfer felt hard to perform (without board) and that her wrists felt weak today; pt required min A for set up of slide board and was able to maintain bil LE NWB. Acute PT will continue to follower current POC to increase independence and safety with mobility.    Assistance Recommended at Discharge Frequent or constant Supervision/Assistance  If plan is discharge home, recommend the following:  Can travel by private vehicle    Help with stairs or ramp for entrance;Assist for transportation;Direct supervision/assist for financial management;Direct supervision/assist for medications management;Assistance with cooking/housework;A little help with bathing/dressing/bathroom;A little help with walking and/or transfers   No  Equipment Recommendations  Hospital bed;Wheelchair (measurements PT);BSC/3in1;Other (comment);Wheelchair cushion (measurements PT)    Recommendations for Other Services       Precautions / Restrictions Precautions Precautions: Fall Restrictions Weight Bearing Restrictions: Yes RLE Weight Bearing: Non weight bearing LLE Weight Bearing: Non weight bearing     Mobility  Bed Mobility Overal bed mobility: Needs Assistance Bed  Mobility: Supine to Sit     Supine to sit: Supervision, HOB elevated Sit to supine: HOB elevated, Min assist   General bed mobility comments: used pad to assist pt scooting to the center of the bed vs. using LEs to assist.    Transfers Overall transfer level: Needs assistance Equipment used: Sliding board Transfers: Bed to chair/wheelchair/BSC            Lateral/Scoot Transfers: Min assist General transfer comment: Pt performed lateral/scoot transfer from [facility] > bed but once on side of bed layed down vs. scooting around onto bed into a sitting EOB position (pt reports doing this with nursing).  Educated pt and explained to nursing safety problems (rolling off bed side) with this technique and encouraged pt to stay upright until fully sitting EOB before lying back in bed.  Pt performed bed to recliner transfer first attempting without slide board but pt reported transfer felt hard and that her wrist felt weak, educated pt on using the slide board for safety this time, safe placement minA-supervision level.    Ambulation/Gait               General Gait Details: Currently unable due to NWB BLEs.   Stairs             Wheelchair Mobility     Tilt Bed    Modified Rankin (Stroke Patients Only)       Balance Overall balance assessment: Needs assistance Sitting-balance support: Feet unsupported, Bilateral upper extremity supported Sitting balance-Leahy Scale: Good         Standing balance comment: Unable to assess  Cognition Arousal/Alertness: Awake/alert Behavior During Therapy: WFL for tasks assessed/performed Overall Cognitive Status: Within Functional Limits for tasks assessed                                          Exercises Total Joint Exercises Towel Squeeze: AROM, Strengthening, Both, 10 reps Other Exercises Other Exercises: L hip internal rotations x5 in long sit and education on positioning  LLE in rest to avoid to much external rotation.    General Comments        Pertinent Vitals/Pain Pain Assessment Pain Assessment: 0-10 Pain Score: 8  Pain Location: LLE > RLE Pain Descriptors / Indicators: Discomfort, Grimacing Pain Intervention(s): Patient requesting pain meds-RN notified, Repositioned, Monitored during session, Limited activity within patient's tolerance    Home Living                          Prior Function            PT Goals (current goals can now be found in the care plan section) Acute Rehab PT Goals Patient Stated Goal: Get better PT Goal Formulation: With patient Potential to Achieve Goals: Good Additional Goals Additional Goal #1: Pt will propel w/c x 150 ft with mod I to increase independence with mobility. Progress towards PT goals: Progressing toward goals    Frequency    7X/week      PT Plan Current plan remains appropriate    Co-evaluation              AM-PAC PT "6 Clicks" Mobility   Outcome Measure  Help needed turning from your back to your side while in a flat bed without using bedrails?: None Help needed moving from lying on your back to sitting on the side of a flat bed without using bedrails?: A Little Help needed moving to and from a bed to a chair (including a wheelchair)?: A Little Help needed standing up from a chair using your arms (e.g., wheelchair or bedside chair)?: Total   Help needed climbing 3-5 steps with a railing? : Total 6 Click Score: 12    End of Session   Activity Tolerance: Patient tolerated treatment well;Treatment limited secondary to medical complications (Comment) (limited by BLE NWB restrictions) Patient left: in chair;with call bell/phone within reach;with nursing/sitter in room Nurse Communication: Mobility status PT Visit Diagnosis: Muscle weakness (generalized) (M62.81);History of falling (Z91.81);Other abnormalities of gait and mobility (R26.89);Pain Pain - Right/Left: Right  (and left) Pain - part of body: Ankle and joints of foot     Time: 4696-2952 PT Time Calculation (min) (ACUTE ONLY): 22 min  Charges:    $Therapeutic Activity: 8-22 mins PT General Charges $$ ACUTE PT VISIT: 1 Visit                    Hortencia Conradi, PTA  09/20/22, 10:41 AM

## 2022-09-20 NOTE — Progress Notes (Signed)
Progress Note   Patient: Diana Quinn ZOX:[id number] DOB: 04-30-1965 DOA: 09/09/2022     10 DOS: the patient was seen and examined on 09/20/2022   Brief hospital course: 57 y.o. female with medical history significant of  PTSD/Bipolar/Schizophrenia, anxiety,Osteoporosis, DMII with history of hypogylcemia, CAD s/p MI with non-obstructive CAD on Cath per patient, Seizure d/o djd for the back , TIA , hx of gastric bypass for treatment of obesity.  She is admitted for nondisplaced medial malleolus and distal lateral calcaneus fracture s/p mechanical fall   Patient is seen by podiatry, not a surgical candidate.  But deemed to be nonweightbearing for bilateral legs.  Currently patient controlled with pain medicine, pending nursing home placement.  Assessment and Plan: Acute metabolic encephalopathy E  Coli UTI CT chest negative for PE, chest x-ray negative.  ABG within normal limit Patient was found to be confused, disoriented clammy and restless.  She was very tremulous and behavior concerning for drug intoxication/withdrawal on 7/5 Nursing found a bottle of Percocet, Zanaflex, Seroquel, Advil in her purse.  Unsure if she over ingested any of this or something else.  She did receive 1 dose of Compazine and Dilaudid.  Difficult to know if she reacted to this Discontinued safety sitter She is back to her normal baseline mental status in few hours on 7/5.  Appreciate psych input.  Discontinued Valium.  Continue Caplyta and Seroquel Patient had a abnormal urine with urine culture grew E. coli.  However, patient mental status has improved.  Urine culture finalized, resistant to Keflex, completed 3 days of Macrobid.     Left distal tibial fracture/nondisplaced and right nondisplaced ankle fracture -Appreciate podiatry input Conservative management plan.  Nonweightbearing bilaterally for 4 to 6 weeks followed by weightbearing as tolerated in cam boot -Patient had developed posterior heel superficial  wound/pressure blister due to inadequate padding in the posterior splint.  Podiatry has changed the dressing yesterday on 7/8 with extra cast padding on the left heel -She will need twice a week splint dressing changes to the left lower extremity.  Xeroform over wound followed by cast padding times extra padding under heel per podiatry - will need splint dressing changes at SNF.  Discussed with podiatry, Dr. Logan Bores, patient may be able to bear weight on right lower extremity.  Will have PT see patient again tomorrow to decide if still appropriate for nursing home versus home health.    PTSD/Bipolar/Anxiety  -Continue Paxil, Seroquel, trazodone, Caplyta Seen by psychiatry.  Continue Caplyta and Seroquel.  Valium discontinued    Hypotension Transient and resolved now   Acute urinary retention  Foley catheter placed on 7/5, removed on 7/7   DMII -Sliding scale insulin for now - A1c 5.5   CAD s/p MI  -CAD non-obstructive but per patient cath noted small caliber vessels  -continue ranxea   DJD of the back  -chronic back pain  -follows with pcp and pain management    TIA hx    Obesity  -s/p gastric bypass    Hx if Seizure d/o  -on trileptal 600 mg twice daily.           Subjective:  Patient doing well today, still has some pain in the legs.  Physical Exam: Vitals:   09/19/22 2127 09/19/22 2311 09/20/22 0500 09/20/22 0734  BP: 117/69 119/65  108/60  Pulse: 71 69  74  Resp:  20  18  Temp:  97.7 F (36.5 C)  98.9 F (37.2 C)  TempSrc:  SpO2:  100%  97%  Weight:   92.1 kg   Height:       General exam: Appears calm and comfortable  Respiratory system: Clear to auscultation. Respiratory effort normal. Cardiovascular system: S1 & S2 heard, RRR. No JVD, murmurs, rubs, gallops or clicks. No pedal edema. Gastrointestinal system: Abdomen is nondistended, soft and nontender. No organomegaly or masses felt. Normal bowel sounds heard. Central nervous system: Alert and  oriented. No focal neurological deficits. Extremities: Symmetric 5 x 5 power. Skin: No rashes, lesions or ulcers Psychiatry: Judgement and insight appear normal. Mood & affect appropriate.   Data Reviewed:  Lab results reviewed.  Family Communication: None  Disposition: Status is: Inpatient Remains inpatient appropriate because: Unsafe discharge options.  Planned Discharge Destination:  SNF    Time spent: 25 minutes  Author: Marrion Coy, MD 09/20/2022 1:18 PM  For on call review [email or web address].

## 2022-09-21 DIAGNOSIS — B9620 Unspecified Escherichia coli [E. coli] as the cause of diseases classified elsewhere: Secondary | ICD-10-CM | POA: Diagnosis not present

## 2022-09-21 DIAGNOSIS — F331 Major depressive disorder, recurrent, moderate: Secondary | ICD-10-CM | POA: Diagnosis not present

## 2022-09-21 DIAGNOSIS — S82302D Unspecified fracture of lower end of left tibia, subsequent encounter for closed fracture with routine healing: Secondary | ICD-10-CM | POA: Diagnosis not present

## 2022-09-21 DIAGNOSIS — N390 Urinary tract infection, site not specified: Secondary | ICD-10-CM | POA: Diagnosis not present

## 2022-09-21 LAB — GLUCOSE, CAPILLARY
Glucose-Capillary: 98 mg/dL (ref 70–99)
Glucose-Capillary: 89 mg/dL (ref 70–99)
Glucose-Capillary: 161 mg/dL — ABNORMAL HIGH (ref 70–99)
Glucose-Capillary: 98 mg/dL (ref 70–99)

## 2022-09-21 LAB — BASIC METABOLIC PANEL
Anion gap: 5 (ref 5–15)
BUN: 14 mg/dL (ref 6–20)
CO2: 27 mmol/L (ref 22–32)
Calcium: 8.2 mg/dL — ABNORMAL LOW (ref 8.9–10.3)
Chloride: 99 mmol/L (ref 98–111)
Creatinine, Ser: 0.64 mg/dL (ref 0.44–1.00)
GFR, Estimated: >60 mL/min (ref >60)
Glucose, Bld: 82 mg/dL (ref 70–99)
Potassium: 4.2 mmol/L (ref 3.5–5.1)
Sodium: 131 mmol/L — ABNORMAL LOW (ref 135–145)

## 2022-09-21 NOTE — Progress Notes (Signed)
   PODIATRY PROGRESS NOTE  NAME Diana Quinn MRN [id number] DOB 11/03/1965 DOA 09/09/2022    Chief Complaint  Patient presents with   Ankle Pain   Fall   ASSESSMENT/PLAN OF CARE B/L ankle fractures. LT worse than RT. DOI: 09/10/2022  -Patient evaluated this morning at bedside -Discussed with other physicians within the group regarding WB status for patient postdischarge -Okay for minimal WB RLE in CAM walker w/ walker. Bathroom and transition purposes only. CAM walker in room. -Below-knee fiberglass cast applied LLE. NWB. Plan to leave intact 4-6 weeks. Xeroform applied to superficial heel abrasion -Okay for discharge from a podiatry standpoint as long as she is cleared by physical therapy -F/u in office 4 weeks postdischarge  Please contact me directly via secure chat with any questions or concerns.     Felecia Shelling, DPM [facility]  Dr. Felecia Shelling, DPM    [address] [place], [place] [address]                Office [phone]  Fax [phone]

## 2022-09-21 NOTE — Progress Notes (Signed)
Physical Therapy Treatment Patient Details Name: Diana Quinn MRN: [id number] DOB: 09-Aug-1965 Today's Date: 09/21/2022   History of Present Illness Pt is a 57 y/o F admitted on 09/09/22 after presenting with c/o fall, rolling both ankles inward & hearing a pop. Pt found to have L distal tibial fx nondisplaced & R nondisplaced ankle fx. Podiatry currently recommending conservative care & NWB BLE x 4-6 weeks. PMH: PTSD, bipolar schizophrenia, anxiety, osteoporosis, DM2, CAD s/p MI with non obstructive CAD on cath, seizure disorder, TIA, DJD    PT Comments  Per discussion with physicians Chipper Herb and Logan Bores), patient now "Okay for minimal WB RLE in CAM walker w/ walker. Bathroom and transition purposes only" (see Dr. Logan Bores note 09/21/22).  Educated patient on updated recommendations and implications for mobility. Initiated assisted activities sit/stand, standing balance, pre-gait and lateral scooting edge of bed with R LE in WBing, min assist from therapist.  Tolerated well and with minimal complaints of pain, but unable to safely progress towards dynamic transfer/gait due to generalized impulsivity and unsteadiness with pre-gait activities. Anticipate patient may still need to remain WC level for optimal safety and indep with functional transfers, but WBing through R LE will optimize control and safety of all transfers.  Goals and POC updated to reflect changes.    Assistance Recommended at Discharge Frequent or constant Supervision/Assistance  If plan is discharge home, recommend the following:  Can travel by private vehicle    Help with stairs or ramp for entrance;Assist for transportation;Direct supervision/assist for financial management;Direct supervision/assist for medications management;Assistance with cooking/housework;A little help with bathing/dressing/bathroom;A little help with walking and/or transfers   No  Equipment Recommendations  Hospital bed;Wheelchair (measurements  PT);BSC/3in1;Other (comment);Wheelchair cushion (measurements PT);Rolling walker (2 wheels)    Recommendations for Other Services       Precautions / Restrictions Precautions Precautions: Fall Restrictions Weight Bearing Restrictions: Yes RLE Weight Bearing:  (Minimal WBing for bathroom and transfers with CAM boot) LLE Weight Bearing: Non weight bearing (cast in place)     Mobility  Bed Mobility Overal bed mobility: Modified Independent                  Transfers Overall transfer level: Needs assistance Equipment used: Rolling walker (2 wheels) Transfers: Sit to/from Stand Sit to Stand: Min assist           General transfer comment: Initiated sit/stand with clearance for additional R LE WBing.  Initially, with difficulty in hand placement, but with time/cuing, able to complete sit/stand with good hand placement (prefers bilat LE on bed until buttocks lifted from surface) and fair/good control.  Tolerates static standing 10-15 seconds with trial with minimal pain to R LE    Ambulation/Gait               General Gait Details: Deferred   Stairs             Wheelchair Mobility     Tilt Bed    Modified Rankin (Stroke Patients Only)       Balance Overall balance assessment: Needs assistance Sitting-balance support: No upper extremity supported, Feet supported Sitting balance-Leahy Scale: Good     Standing balance support: Bilateral upper extremity supported Standing balance-Leahy Scale: Fair                              Cognition Arousal/Alertness: Awake/alert Behavior During Therapy: WFL for tasks assessed/performed Overall Cognitive Status: Within Functional Limits for tasks assessed  Exercises Other Exercises Other Exercises: Sit/stand from standard bed height x3 with bilat UEs, min assist.  Progressed to include small-scale 'hops' in place to assess ability to  tolerate and complete pre-gait activities.  Able to lift/clear R LE with extensive effort, but generally impulsive and unsteady.  Unsafe to initiate formal stepping or transfer at this time. Other Exercises: Further progressed to include lateral scooting edge of bed with R LE in WBing; fully lifts/clears buttocks with supervision. Much improved comfort/control in movement with R LE for stability. Other Exercises: Patient declined OOB to chair this date    General Comments        Pertinent Vitals/Pain Pain Assessment Pain Assessment: Faces Faces Pain Scale: Hurts a little bit Pain Location: LLE > RLE Pain Descriptors / Indicators: Aching Pain Intervention(s): Limited activity within patient's tolerance, Monitored during session, Repositioned    Home Living                          Prior Function            PT Goals (current goals can now be found in the care plan section) Acute Rehab PT Goals Patient Stated Goal: Get better PT Goal Formulation: With patient Time For Goal Achievement: 10/05/22 Potential to Achieve Goals: Good Additional Goals Additional Goal #1: Pt will propel w/c x 150 ft with mod I to increase independence with mobility. Progress towards PT goals: Progressing toward goals    Frequency    7X/week      PT Plan Current plan remains appropriate    Co-evaluation              AM-PAC PT "6 Clicks" Mobility   Outcome Measure  Help needed turning from your back to your side while in a flat bed without using bedrails?: None Help needed moving from lying on your back to sitting on the side of a flat bed without using bedrails?: None Help needed moving to and from a bed to a chair (including a wheelchair)?: A Little Help needed standing up from a chair using your arms (e.g., wheelchair or bedside chair)?: A Little Help needed to walk in hospital room?: Total Help needed climbing 3-5 steps with a railing? : Total 6 Click Score: 16    End of  Session Equipment Utilized During Treatment: Gait belt Activity Tolerance: Patient tolerated treatment well Patient left: in bed;with call bell/phone within reach;with bed alarm set Nurse Communication: Mobility status PT Visit Diagnosis: Muscle weakness (generalized) (M62.81);History of falling (Z91.81);Other abnormalities of gait and mobility (R26.89);Pain Pain - Right/Left: Right Pain - part of body: Ankle and joints of foot     Time: 1640-1700 PT Time Calculation (min) (ACUTE ONLY): 20 min  Charges:    $Therapeutic Activity: 8-22 mins PT General Charges $$ ACUTE PT VISIT: 1 Visit                     Fritzie Prioleau H. Manson Passey, PT, DPT, NCS 09/21/22, 5:48 PM [phone]

## 2022-09-21 NOTE — Plan of Care (Signed)

## 2022-09-21 NOTE — Progress Notes (Signed)
Progress Note   Patient: Diana Quinn OZH:[id number] DOB: 02-May-1965 DOA: 09/09/2022     11 DOS: the patient was seen and examined on 09/21/2022   Brief hospital course: 57 y.o. female with medical history significant of  PTSD/Bipolar/Schizophrenia, anxiety,Osteoporosis, DMII with history of hypogylcemia, CAD s/p MI with non-obstructive CAD on Cath per patient, Seizure d/o djd for the back , TIA , hx of gastric bypass for treatment of obesity.  She is admitted for nondisplaced medial malleolus and distal lateral calcaneus fracture s/p mechanical fall   Patient is seen by podiatry, not a surgical candidate.  But deemed to be nonweightbearing for bilateral legs.  Currently patient controlled with pain medicine, pending nursing home placement.   Principal Problem:   Ankle fracture Active Problems:   Closed fracture of left distal tibia   Major depressive disorder, recurrent episode, moderate (HCC)   Hyponatremia   E. coli UTI   Assessment and Plan:  Acute metabolic encephalopathy E  Coli UTI CT chest negative for PE, chest x-ray negative.  ABG within normal limit Patient was found to be confused, disoriented clammy and restless.  She was very tremulous and behavior concerning for drug intoxication/withdrawal on 7/5 Nursing found a bottle of Percocet, Zanaflex, Seroquel, Advil in her purse.  Unsure if she over ingested any of this or something else.  She did receive 1 dose of Compazine and Dilaudid.  Difficult to know if she reacted to this Discontinued safety sitter She is back to her normal baseline mental status in few hours on 7/5.  Appreciate psych input.  Discontinued Valium.  Continue Caplyta and Seroquel Patient had a abnormal urine with urine culture grew E. coli.  However, patient mental status has improved.  Urine culture finalized, resistant to Keflex, completed 3 days of Macrobid.     Left distal tibial fracture/nondisplaced and right nondisplaced ankle fracture -Appreciate  podiatry input Conservative management plan.  Nonweightbearing bilaterally for 4 to 6 weeks followed by weightbearing as tolerated in cam boot -Patient had developed posterior heel superficial wound/pressure blister due to inadequate padding in the posterior splint.  Podiatry has changed the dressing yesterday on 7/8 with extra cast padding on the left heel -She will need twice a week splint dressing changes to the left lower extremity.  Xeroform over wound followed by cast padding times extra padding under heel per podiatry - will need splint dressing changes at SNF.  Discussed with podiatry, Dr. Logan Bores, patient may be able to bear weight on right lower extremity.  Will have PT see patient again  to decide if still appropriate for nursing home versus home health.     PTSD/Bipolar/Anxiety  -Continue Paxil, Seroquel, trazodone, Caplyta Seen by psychiatry.  Continue Caplyta and Seroquel.  Valium discontinued    Hypotension Transient and resolved now   Acute urinary retention  Foley catheter placed on 7/5, removed on 7/7   DMII -Sliding scale insulin for now - A1c 5.5   CAD s/p MI  -CAD non-obstructive but per patient cath noted small caliber vessels  -continue ranxea   DJD of the back  -chronic back pain  -follows with pcp and pain management    TIA hx    Obesity  -s/p gastric bypass    Hx if Seizure d/o  -on trileptal 600 mg twice daily.    Patient condition is stable, pending discharge options.    Subjective: Patient currently has no complaint.  Physical Exam: Vitals:   09/20/22 2220 09/20/22 2226 09/21/22 0500 09/21/22 6295  BP: 130/61 130/61  115/75  Pulse:  92  88  Resp:  20  16  Temp:  97.8 F (36.6 C)  98.1 F (36.7 C)  TempSrc:      SpO2:  98%  96%  Weight:   91.7 kg   Height:       General exam: Appears calm and comfortable  Respiratory system: Clear to auscultation. Respiratory effort normal. Cardiovascular system: S1 & S2 heard, RRR. No JVD,  murmurs, rubs, gallops or clicks. No pedal edema. Gastrointestinal system: Abdomen is nondistended, soft and nontender. No organomegaly or masses felt. Normal bowel sounds heard. Central nervous system: Alert and oriented. No focal neurological deficits. Extremities: Symmetric 5 x 5 power. Skin: No rashes, lesions or ulcers Psychiatry: Judgement and insight appear normal. Mood & affect appropriate.    Data Reviewed:  There are no new results to review at this time.  Family Communication: None  Disposition: Status is: Inpatient Remains inpatient appropriate because: Unsafe discharge option.     Time spent: 25 minutes  Author: Marrion Coy, MD 09/21/2022 11:41 AM  For on call review [email or web address].

## 2022-09-22 DIAGNOSIS — B9620 Unspecified Escherichia coli [E. coli] as the cause of diseases classified elsewhere: Secondary | ICD-10-CM | POA: Diagnosis not present

## 2022-09-22 DIAGNOSIS — F331 Major depressive disorder, recurrent, moderate: Secondary | ICD-10-CM | POA: Diagnosis not present

## 2022-09-22 DIAGNOSIS — N390 Urinary tract infection, site not specified: Secondary | ICD-10-CM | POA: Diagnosis not present

## 2022-09-22 DIAGNOSIS — S82302D Unspecified fracture of lower end of left tibia, subsequent encounter for closed fracture with routine healing: Secondary | ICD-10-CM | POA: Diagnosis not present

## 2022-09-22 LAB — GLUCOSE, CAPILLARY
Glucose-Capillary: 95 mg/dL (ref 70–99)
Glucose-Capillary: 73 mg/dL (ref 70–99)
Glucose-Capillary: 134 mg/dL — ABNORMAL HIGH (ref 70–99)
Glucose-Capillary: 122 mg/dL — ABNORMAL HIGH (ref 70–99)

## 2022-09-22 MED ORDER — LACTULOSE 10 GM/15ML PO SOLN
20.0000 g | Freq: Once | ORAL | Status: AC
  Administered 2022-09-22: 20 g via ORAL
  Filled 2022-09-22: qty 30

## 2022-09-22 NOTE — Progress Notes (Signed)
Inpatient Rehab Admissions Coordinator:   Per PT request, pt was screened for CIR by Estill Dooms, PT, DPT.  At this time, pt appears to be a potential candidate based on medical necessity and functional decline.  Note TOC documentation of potential barriers to CIR admission including home accessibility and caregiver support.  I will place a rehab consult order for full assessment per our protocol.   Estill Dooms, PT, DPT Admissions Coordinator [phone] 09/22/22  11:26 AM

## 2022-09-22 NOTE — Progress Notes (Signed)
Occupational Therapy Treatment Patient Details Name: Diana Quinn MRN: [id number] DOB: 11-23-1965 Today's Date: 09/22/2022   History of present illness Pt is a 57 y/o F admitted on 09/09/22 after presenting with c/o fall, rolling both ankles inward & hearing a pop. Pt found to have L distal tibial fx nondisplaced & R nondisplaced ankle fx. Podiatry currently recommending conservative care & NWB BLE x 4-6 weeks. 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 Upon arrival to room pt resting in bed, agreeable to tx. Pt requires MIN A for transfer to [facility] with slide board in place on level surface. Pt attempted transfer to elevated surface, but attempt was terminated secondary to discomfort and feeling stuck at the half way point. MOD A with RW for stand pivot to chair from EOB, assist to maintain weight bearing precautions. Poor patient tolerance in standing position with fatigue and pain. Pt educated on safe use of DME and transferring techniques. Pt making good progress, goals were updated. Discharge recommendation was updated to reflect progress.     Recommendations for follow up therapy are one component of a multi-disciplinary discharge planning process, led by the attending physician.  Recommendations may be updated based on patient status, additional functional criteria and insurance authorization.    Assistance Recommended at Discharge Frequent or constant Supervision/Assistance  Patient can return home with the following  Help with stairs or ramp for entrance;Two people to help with walking and/or transfers;Two people to help with bathing/dressing/bathroom   Equipment Recommendations  Hospital bed    Recommendations for Other Services      Precautions / Restrictions Precautions Precautions: Fall Restrictions Weight Bearing Restrictions: Yes RLE  Weight Bearing:  (Minimal WBing for bathroom and transfers with CAM boot) LLE Weight Bearing: Non weight bearing (cast in place)       Mobility Bed Mobility Overal bed mobility: Needs Assistance         Sit to supine: Min assist   General bed mobility comments: assistance is required for RLE with the CAM boot in place. verbal cues for sequencing    Transfers Overall transfer level: Needs assistance Equipment used: Sliding board Transfers: Bed to chair/wheelchair/BSC, Sit to/from Stand Sit to Stand: Min assist          Lateral/Scoot Transfers: Min assist, With slide board General transfer comment: MIN A for slide board positioning and weight shifting once on the BSC.     Balance Overall balance assessment: Needs assistance Sitting-balance support: No upper extremity supported, Feet supported Sitting balance-Leahy Scale: Good     Standing balance support: Bilateral upper extremity supported Standing balance-Leahy Scale: Fair Standing balance comment: Reliant on RW to stand at EOB                           ADL either performed or assessed with clinical judgement   ADL Overall ADL's : Needs assistance/impaired                         Toilet Transfer: Fish farm manager;Requires drop arm;Minimal assistance Toilet Transfer Details (indicate cue type and reason): Pt transferred to level surface, but terminated attempt that required transfering to elevated surface.         Functional mobility during ADLs: Set up      Extremity/Trunk Assessment Upper Extremity  Assessment Upper Extremity Assessment: Overall WFL for tasks assessed   Lower Extremity Assessment Lower Extremity Assessment: Defer to PT evaluation        Vision       Perception     Praxis      Cognition Arousal/Alertness: Awake/alert Behavior During Therapy: WFL for tasks assessed/performed Overall Cognitive Status: Within Functional Limits for tasks assessed                                  General Comments: Difficulty adhering to NWBing status with LLE        Exercises      Shoulder Instructions       General Comments      Pertinent Vitals/ Pain       Pain Assessment Pain Assessment: Faces Faces Pain Scale: Hurts even more Pain Location: L and R leg Pain Descriptors / Indicators: Discomfort, Aching, Grimacing Pain Intervention(s): Limited activity within patient's tolerance  Home Living                                          Prior Functioning/Environment              Frequency  Min 1X/week        Progress Toward Goals  OT Goals(current goals can now be found in the care plan section)  Progress towards OT goals: Goals met and updated - see care plan  Acute Rehab OT Goals Patient Stated Goal: to decrease my chance of further injury OT Goal Formulation: With patient Time For Goal Achievement: 10/06/22 Potential to Achieve Goals: Good ADL Goals Pt Will Perform Lower Body Dressing: Independently;sitting/lateral leans Pt Will Transfer to Toilet: with modified independence;bedside commode;with transfer board Pt Will Perform Toileting - Clothing Manipulation and hygiene: sitting/lateral leans;Independently  Plan Discharge plan needs to be updated    Co-evaluation                 AM-PAC OT "6 Clicks" Daily Activity     Outcome Measure   Help from another person eating meals?: None Help from another person taking care of personal grooming?: None Help from another person toileting, which includes using toliet, bedpan, or urinal?: A Lot Help from another person bathing (including washing, rinsing, drying)?: A Lot Help from another person to put on and taking off regular upper body clothing?: A Little Help from another person to put on and taking off regular lower body clothing?: A Lot 6 Click Score: 17    End of Session Equipment Utilized During Treatment: Rolling walker (2 wheels);Other  (comment) (cast and boot in place)  OT Visit Diagnosis: Other abnormalities of gait and mobility (R26.89);Muscle weakness (generalized) (M62.81)   Activity Tolerance     Patient Left     Nurse Communication          Time: 1610-9604 OT Time Calculation (min): 36 min  Charges: OT General Charges $OT Visit: 1 Visit OT Treatments $Self Care/Home Management : 23-37 mins  [employer], OTS

## 2022-09-22 NOTE — Progress Notes (Signed)
Progress Note   Patient: Diana Quinn ZOX:[id number] DOB: 1965-09-21 DOA: 09/09/2022     12 DOS: the patient was seen and examined on 09/22/2022   Brief hospital course: 57 y.o. female with medical history significant of  PTSD/Bipolar/Schizophrenia, anxiety,Osteoporosis, DMII with history of hypogylcemia, CAD s/p MI with non-obstructive CAD on Cath per patient, Seizure d/o djd for the back , TIA , hx of gastric bypass for treatment of obesity.  She is admitted for nondisplaced medial malleolus and distal lateral calcaneus fracture s/p mechanical fall   Patient is seen by podiatry, not a surgical candidate.  But deemed to be nonweightbearing for bilateral legs.  Currently patient controlled with pain medicine, pending placement.   Principal Problem:   Ankle fracture Active Problems:   Closed fracture of left distal tibia   Major depressive disorder, recurrent episode, moderate (HCC)   Hyponatremia   E. coli UTI   Assessment and Plan: Acute metabolic encephalopathy E  Coli UTI CT chest negative for PE, chest x-ray negative.  ABG within normal limit Patient was found to be confused, disoriented clammy and restless.  She was very tremulous and behavior concerning for drug intoxication/withdrawal on 7/5 Nursing found a bottle of Percocet, Zanaflex, Seroquel, Advil in her purse.  Unsure if she over ingested any of this or something else.  She did receive 1 dose of Compazine and Dilaudid.  Difficult to know if she reacted to this Discontinued safety sitter She is back to her normal baseline mental status in few hours on 7/5.  Appreciate psych input.  Discontinued Valium.  Continue Caplyta and Seroquel Patient had a abnormal urine with urine culture grew E. coli.  However, patient mental status has improved.  Urine culture finalized, resistant to Keflex, completed 3 days of Macrobid.     Left distal tibial fracture/nondisplaced and right nondisplaced ankle fracture -Appreciate podiatry  input Conservative management plan.  Nonweightbearing bilaterally for 4 to 6 weeks followed by weightbearing as tolerated in cam boot -Patient had developed posterior heel superficial wound/pressure blister due to inadequate padding in the posterior splint.  Podiatry has changed the dressing yesterday on 7/8 with extra cast padding on the left heel -She will need twice a week splint dressing changes to the left lower extremity.  Xeroform over wound followed by cast padding times extra padding under heel per podiatry - will need splint dressing changes at SNF.  Patient is seen by podiatry again 7/14, now patient cannot bear weight on right lower extremity.  Seen by PT/OT, recommend acute rehab.     PTSD/Bipolar/Anxiety  -Continue Paxil, Seroquel, trazodone, Caplyta Seen by psychiatry.  Continue Caplyta and Seroquel.  Valium discontinued    Hypotension Transient and resolved now   Acute urinary retention  Foley catheter placed on 7/5, removed on 7/7   DMII -Sliding scale insulin for now - A1c 5.5   CAD s/p MI  -CAD non-obstructive but per patient cath noted small caliber vessels  -continue ranxea   DJD of the back  -chronic back pain  -follows with pcp and pain management    TIA hx    Obesity  -s/p gastric bypass    Hx if Seizure d/o  -on trileptal 600 mg twice daily.           Subjective:  Patient doing well, no complaint today.  Physical Exam: Vitals:   09/21/22 1653 09/21/22 2205 09/22/22 0500 09/22/22 0730  BP: 115/63 (!) 105/51  (!) 118/58  Pulse: 83 93  84  Resp:  14 20  14   Temp: 97.7 F (36.5 C) 98.6 F (37 C)  98.9 F (37.2 C)  TempSrc: Oral Oral    SpO2: 96% 100%  96%  Weight:   92 kg   Height:       General exam: Appears calm and comfortable  Respiratory system: Clear to auscultation. Respiratory effort normal. Cardiovascular system: S1 & S2 heard, RRR. No JVD, murmurs, rubs, gallops or clicks. No pedal edema. Gastrointestinal system: Abdomen  is nondistended, soft and nontender. No organomegaly or masses felt. Normal bowel sounds heard. Central nervous system: Alert and oriented. No focal neurological deficits. Extremities: Symmetric 5 x 5 power. Skin: No rashes, lesions or ulcers Psychiatry: Judgement and insight appear normal. Mood & affect appropriate.    Data Reviewed:  There are no new results to review at this time.  Family Communication: None  Disposition: Status is: Inpatient Remains inpatient appropriate because: Unsafe discharge option.     Time spent: 25 minutes  Author: Marrion Coy, MD 09/22/2022 12:55 PM  For on call review [email or web address].

## 2022-09-22 NOTE — TOC Progression Note (Signed)
Transition of Care [facility]) - Progression Note    Patient Details  Name: Diana Quinn MRN: [id number] Date of Birth: 08/12/1965  Transition of Care [facility]) CM/SW Contact  Marlowe Sax, RN Phone Number: 09/22/2022, 3:01 PM  Clinical Narrative:   Spoke with the patient concerning Plan at DC, She provided permission for me to return the call to Tilda Franco as [facility] [employer] had called asking for a call back, I called [phone] and spoke with [facility], she was very upset and asked how we could send the patient home with partial weight bearing and no weight bearing and little family support.  She also asked how we expected her to walk with a walker.  I explained that I had sent out the bed search to multiple facilities, actually every single facility in the [place] including [place], I explained that none had made a bed offer after Multiple attempts.  I explained that I not once told the patient to go home with a walker but explained that I could set up a Wheelchair, a transfer board and a BSC and EMS to get into the home.  I also explained that PT had requested several times for the patient to have her family come to the hospital for training on how to maneuver and safely get around with the above mentioned DME as well as safety in going up stairs, I explained that PT had requested this more than once and explained that we were not wanting or asking for Family to lift the patient but be taught safe effective support techniques. I explained that I had exhausted every available resource to find a STR facility and was not successful,  PT has sent the referral to [facility] and they are evaluating for a bed there.  I explained this to [facility], She stated that she can not see taking the patient home in her condition and she would expect her to stay in the hospital if [facility] can not accept her.  I explained that the Hospital is for Cute ill patients and having fractured legs does not require medical care for the amount of time it  takes to heal.  She stated that she hopes that [facility] will accept the patient because she feels that she needs additional therapy before returning home, she wrote down my number to contact for questions   Expected Discharge Plan:  (tbd) Barriers to Discharge: Continued Medical Work up  Expected Discharge Plan and Services   Discharge Planning Services: CM Consult   Living arrangements for the past 2 months: Single Family Home                                       Social Determinants of Health (SDOH) Interventions SDOH Screenings   Food Insecurity: No Food Insecurity (09/10/2022)  Housing: Low Risk  (09/10/2022)  Transportation Needs: No Transportation Needs (09/10/2022)  Utilities: Not At Risk (09/10/2022)  Depression (PHQ2-9): Low Risk  (10/26/2018)  Tobacco Use: Low Risk  (09/09/2022)    Readmission Risk Interventions     No data to display

## 2022-09-22 NOTE — Progress Notes (Signed)
Physical Therapy Treatment Patient Details Name: Diana Quinn MRN: [id number] DOB: Jul 28, 1965 Today's Date: 09/22/2022   History of Present Illness Pt is a 57 y/o F admitted on 09/09/22 after presenting with c/o fall, rolling both ankles inward & hearing a pop. Pt found to have L distal tibial fx nondisplaced & R nondisplaced ankle fx. Podiatry currently recommending conservative care & NWB BLE x 4-6 weeks. PMH: PTSD, bipolar schizophrenia, anxiety, osteoporosis, DM2, CAD s/p MI with non obstructive CAD on cath, seizure disorder, TIA, DJD    PT Comments  Patient is agreeable to PT. Attempted to set up environment to simulate home set-up. Patient required physical assistance with getting the right leg into bed with cues for technique. She required minimal assistance for lateral scoot transfer from recliner to wheelchair and wheelchair to bed using transfer board. Patient has increased pain when feet are in the dependent position and less pain reported with legs elevated. She required physical assistance with wheelchair parts management. She can propel without physical assistance using BUE support in increments of 29ft before requiring rest breaks due to fatigue. Anticipate the patient could reach modified independent level at the wheelchair level with continued PT. Anticipate need for frequent assistance required at this time  at discharge which patient reports she does not have at home. Consider intensive therapy >3 hours per day to facilitate independence and decrease caregiver burden.     Assistance Recommended at Discharge Frequent or constant Supervision/Assistance  If plan is discharge home, recommend the following:  Can travel by private vehicle    Help with stairs or ramp for entrance;Assist for transportation;Direct supervision/assist for financial management;Direct supervision/assist for medications management;Assistance with cooking/housework;A little help with bathing/dressing/bathroom;A  little help with walking and/or transfers   No  Equipment Recommendations  Hospital bed;Wheelchair (measurements PT);BSC/3in1;Other (comment);Wheelchair cushion (measurements PT);Rolling walker (2 wheels)    Recommendations for Other Services       Precautions / Restrictions Precautions Precautions: Fall Restrictions Weight Bearing Restrictions: Yes RLE Weight Bearing:  (Minimal WBing for bathroom and transfers with CAM boot) LLE Weight Bearing: Non weight bearing (cast in place)     Mobility  Bed Mobility Overal bed mobility: Needs Assistance         Sit to supine: Min assist   General bed mobility comments: assistance is required for RLE with the CAM boot in place. verbal cues for sequencing    Transfers Overall transfer level: Needs assistance Equipment used: Sliding board Transfers: Bed to chair/wheelchair/BSC            Lateral/Scoot Transfers: Min assist, With slide board General transfer comment: physical assistance is required for lateral scoot transition as well as assistance required to maintain true NWB of LLE. weight bearing with RLE for transfers only with increased pain reports with feet in dependent position. verbal cues required for techniques to increase independence with transfers. patient transferred from recliner chair to wheelchair and from wheelchair to the bed    Ambulation/Gait               General Gait Details: not attempted   Psychologist, counselling mobility: Yes Wheelchair propulsion: Both upper extremities Wheelchair parts: Needs assistance Distance: 25 (x 3 bouts) Wheelchair Assistance Details (indicate cue type and reason): patient required physical assistance for removing the leg rest and arm rests in preparation for lateral transfers using the transfer board. she needed intermittent physical assistance to unlock  the right break. patient was able to propel around tight  spaces with moderate cues for technique. she is able to propel a distance of up to 30ft before requiring rest break due to upper body fatigue   Tilt Bed    Modified Rankin (Stroke Patients Only)       Balance Overall balance assessment: Needs assistance Sitting-balance support: No upper extremity supported, Feet supported Sitting balance-Leahy Scale: Good                                      Cognition Arousal/Alertness: Awake/alert Behavior During Therapy: WFL for tasks assessed/performed Overall Cognitive Status: Within Functional Limits for tasks assessed                                          Exercises      General Comments        Pertinent Vitals/Pain Pain Assessment Pain Assessment: 0-10 Pain Score: 7  Pain Location: L and R leg Pain Descriptors / Indicators: Discomfort, Aching Pain Intervention(s): Limited activity within patient's tolerance, Monitored during session, Repositioned    Home Living                          Prior Function            PT Goals (current goals can now be found in the care plan section) Acute Rehab PT Goals Patient Stated Goal: pain control, go to rehab PT Goal Formulation: With patient Time For Goal Achievement: 10/05/22 Potential to Achieve Goals: Good Progress towards PT goals: Progressing toward goals    Frequency    7X/week      PT Plan Discharge plan needs to be updated    Co-evaluation              AM-PAC PT "6 Clicks" Mobility   Outcome Measure  Help needed turning from your back to your side while in a flat bed without using bedrails?: None Help needed moving from lying on your back to sitting on the side of a flat bed without using bedrails?: A Little Help needed moving to and from a bed to a chair (including a wheelchair)?: A Little Help needed standing up from a chair using your arms (e.g., wheelchair or bedside chair)?: A Little Help needed to walk in  hospital room?: Total Help needed climbing 3-5 steps with a railing? : Total 6 Click Score: 15    End of Session Equipment Utilized During Treatment: Gait belt Activity Tolerance: Patient tolerated treatment well Patient left: in bed;with call bell/phone within reach;with bed alarm set Nurse Communication: Mobility status PT Visit Diagnosis: Muscle weakness (generalized) (M62.81);History of falling (Z91.81);Other abnormalities of gait and mobility (R26.89);Pain Pain - Right/Left: Right Pain - part of body: Ankle and joints of foot     Time: 2440-1027 PT Time Calculation (min) (ACUTE ONLY): 42 min  Charges:    $Therapeutic Activity: 38-52 mins PT General Charges $$ ACUTE PT VISIT: 1 Visit                    Donna Bernard, PT, MPT    Ina Homes 09/22/2022, 1:05 PM

## 2022-09-22 NOTE — Plan of Care (Signed)

## 2022-09-23 DIAGNOSIS — G9341 Metabolic encephalopathy: Secondary | ICD-10-CM

## 2022-09-23 DIAGNOSIS — N390 Urinary tract infection, site not specified: Secondary | ICD-10-CM | POA: Diagnosis not present

## 2022-09-23 DIAGNOSIS — B9620 Unspecified Escherichia coli [E. coli] as the cause of diseases classified elsewhere: Secondary | ICD-10-CM | POA: Diagnosis not present

## 2022-09-23 DIAGNOSIS — S82302D Unspecified fracture of lower end of left tibia, subsequent encounter for closed fracture with routine healing: Secondary | ICD-10-CM | POA: Diagnosis not present

## 2022-09-23 HISTORY — DX: Metabolic encephalopathy: G93.41

## 2022-09-23 LAB — GLUCOSE, CAPILLARY
Glucose-Capillary: 84 mg/dL (ref 70–99)
Glucose-Capillary: 96 mg/dL (ref 70–99)
Glucose-Capillary: 101 mg/dL — ABNORMAL HIGH (ref 70–99)
Glucose-Capillary: 104 mg/dL — ABNORMAL HIGH (ref 70–99)

## 2022-09-23 NOTE — Progress Notes (Signed)
Physical Therapy Treatment Patient Details Name: Diana Quinn MRN: [id number] DOB: Oct 08, 1965 Today's Date: 09/23/2022   History of Present Illness Pt is a 57 y/o F admitted on 09/09/22 after presenting with c/o fall, rolling both ankles inward & hearing a pop. Pt found to have L distal tibial fx nondisplaced & R nondisplaced ankle fx. Podiatry currently recommending conservative care & NWB BLE x 4-6 weeks. PMH: PTSD, bipolar schizophrenia, anxiety, osteoporosis, DM2, CAD s/p MI with non obstructive CAD on cath, seizure disorder, TIA, DJD    PT Comments  Patient remains motivated to participate with PT, regain independence, and get to rehab. Reviewed importance of maintaining NWB of LLE with transfers. Incremental lateral scoot performed from bed to recliner chair with cues for technique and occasional assistance provided. Patient participate with LE exercises in recliner chair. Recommend intensive therapy at discharge to facilitate independence in preparation for discharge home with family support.     Assistance Recommended at Discharge Frequent or constant Supervision/Assistance  If plan is discharge home, recommend the following:  Can travel by private vehicle    Help with stairs or ramp for entrance;Assist for transportation;Direct supervision/assist for financial management;Direct supervision/assist for medications management;Assistance with cooking/housework;A little help with bathing/dressing/bathroom;A little help with walking and/or transfers   No  Equipment Recommendations  Hospital bed;Wheelchair (measurements PT);BSC/3in1;Other (comment);Wheelchair cushion (measurements PT);Rolling walker (2 wheels)    Recommendations for Other Services       Precautions / Restrictions Precautions Precautions: Fall Restrictions Weight Bearing Restrictions: Yes RLE Weight Bearing: Weight bearing as tolerated LLE Weight Bearing: Non weight bearing     Mobility  Bed Mobility Overal bed  mobility: Needs Assistance Bed Mobility: Supine to Sit     Supine to sit: Min guard, HOB elevated     General bed mobility comments: increased time required    Transfers Overall transfer level: Needs assistance Equipment used: None Transfers: Bed to chair/wheelchair/BSC            Lateral/Scoot Transfers: Min assist General transfer comment: bed height slightly elevated compared to recliner chair. cues for techniques with emphasis on maintaining NWB of LLE with transfers. patient using mostly BUE with occasional support from RLE in CAM boot to complete transfer.    Ambulation/Gait               General Gait Details: not attempted   Stairs             Wheelchair Mobility     Tilt Bed    Modified Rankin (Stroke Patients Only)       Balance Overall balance assessment: Needs assistance Sitting-balance support: No upper extremity supported, Feet supported Sitting balance-Leahy Scale: Good                                      Cognition Arousal/Alertness: Awake/alert Behavior During Therapy: WFL for tasks assessed/performed Overall Cognitive Status: Within Functional Limits for tasks assessed                                          Exercises General Exercises - Lower Extremity Long Arc Quad: AAROM, Strengthening, Both, 10 reps, Seated Other Exercises Other Exercises: cues for exercise technique. CAM boot and cast add mild resistance    General Comments        Pertinent Vitals/Pain  Pain Assessment Pain Assessment: Faces Faces Pain Scale: Hurts little more Pain Location: L more than R LE Pain Descriptors / Indicators: Aching, Discomfort Pain Intervention(s): Limited activity within patient's tolerance, Monitored during session    Home Living                          Prior Function            PT Goals (current goals can now be found in the care plan section) Acute Rehab PT Goals Patient  Stated Goal: pain control, go to rehab PT Goal Formulation: With patient Time For Goal Achievement: 10/05/22 Potential to Achieve Goals: Good Progress towards PT goals: Progressing toward goals    Frequency    7X/week      PT Plan Current plan remains appropriate    Co-evaluation              AM-PAC PT "6 Clicks" Mobility   Outcome Measure  Help needed turning from your back to your side while in a flat bed without using bedrails?: None Help needed moving from lying on your back to sitting on the side of a flat bed without using bedrails?: A Little Help needed moving to and from a bed to a chair (including a wheelchair)?: A Little Help needed standing up from a chair using your arms (e.g., wheelchair or bedside chair)?: A Little Help needed to walk in hospital room?: Total Help needed climbing 3-5 steps with a railing? : Total 6 Click Score: 15    End of Session   Activity Tolerance: Patient tolerated treatment well Patient left: in chair;with call bell/phone within reach Nurse Communication: Mobility status PT Visit Diagnosis: Muscle weakness (generalized) (M62.81);History of falling (Z91.81);Other abnormalities of gait and mobility (R26.89);Pain Pain - Right/Left: Right Pain - part of body: Ankle and joints of foot     Time: 7829-5621 PT Time Calculation (min) (ACUTE ONLY): 16 min  Charges:    $Therapeutic Activity: 8-22 mins PT General Charges $$ ACUTE PT VISIT: 1 Visit                    Donna Bernard, PT, MPT   Ina Homes 09/23/2022, 10:55 AM

## 2022-09-23 NOTE — Plan of Care (Signed)

## 2022-09-23 NOTE — Progress Notes (Signed)
Progress Note   Patient: Diana Quinn NUU:[id number] DOB: [birth year] DOA: 09/09/2022     13 DOS: the patient was seen and examined on 09/23/2022   Brief hospital course: 57 y.o. female with medical history significant of  PTSD/Bipolar/Schizophrenia, anxiety,Osteoporosis, DMII with history of hypogylcemia, CAD s/p MI with non-obstructive CAD on Cath per patient, Seizure d/o djd for the back , TIA , hx of gastric bypass for treatment of obesity.  She is admitted for nondisplaced medial malleolus and distal lateral calcaneus fracture s/p mechanical fall   Patient is seen by podiatry, not a surgical candidate.  But deemed to be nonweightbearing for bilateral legs.  Currently patient controlled with pain medicine, pending placement.   Principal Problem:   Ankle fracture Active Problems:   Closed fracture of left distal tibia   Major depressive disorder, recurrent episode, moderate (HCC)   Hyponatremia   E. coli UTI   Assessment and Plan: Acute metabolic encephalopathy E  Coli UTI CT chest negative for PE, chest x-ray negative.  ABG within normal limit Patient was found to be confused, disoriented clammy and restless.  She was very tremulous and behavior concerning for drug intoxication/withdrawal on 7/5 Nursing found a bottle of Percocet, Zanaflex, Seroquel, Advil in her purse.  Unsure if she over ingested any of this or something else.  She did receive 1 dose of Compazine and Dilaudid.  Difficult to know if she reacted to this Discontinued safety sitter She is back to her normal baseline mental status in few hours on 7/5.  Appreciate psych input.  Discontinued Valium.  Continue Caplyta and Seroquel Patient had a abnormal urine with urine culture grew E. coli.  However, patient mental status has improved.  Urine culture finalized, resistant to Keflex, completed 3 days of Macrobid.     Left distal tibial fracture/nondisplaced and right nondisplaced ankle fracture -Appreciate podiatry  input Conservative management plan.  Nonweightbearing bilaterally for 4 to 6 weeks followed by weightbearing as tolerated in cam boot -Patient had developed posterior heel superficial wound/pressure blister due to inadequate padding in the posterior splint.  Podiatry has changed the dressing yesterday on 7/8 with extra cast padding on the left heel -She will need twice a week splint dressing changes to the left lower extremity.  Xeroform over wound followed by cast padding times extra padding under heel per podiatry - will need splint dressing changes at SNF.  Patient is seen by podiatry again 7/14, now patient cannot bear weight on right lower extremity.  Seen by PT/OT, recommend acute rehab.     PTSD/Bipolar/Anxiety  -Continue Paxil, Seroquel, trazodone, Caplyta Seen by psychiatry.  Continue Caplyta and Seroquel.  Valium discontinued    Hypotension Transient and resolved now   Acute urinary retention  Foley catheter placed on 7/5, removed on 7/7   DMII -Sliding scale insulin for now - A1c 5.5   CAD s/p MI  -CAD non-obstructive but per patient cath noted small caliber vessels  -continue ranxea   DJD of the back  -chronic back pain  -follows with pcp and pain management    TIA hx    Obesity  -s/p gastric bypass    Hx if Seizure d/o  -on trileptal 600 mg twice daily.    No change in treatment plan, still pending placement.    Subjective: Patient was constipated, received lactulose, had large bowel movements yesterday.  Physical Exam: Vitals:   09/22/22 1516 09/22/22 2337 09/23/22 0429 09/23/22 0732  BP: (!) 112/56 (!) 122/51  (!) 109/57  Pulse: 85 92  78  Resp: 14 20  14   Temp: 98.9 F (37.2 C) 98.6 F (37 C)  98.1 F (36.7 C)  TempSrc:      SpO2: 97% 100%  96%  Weight:   92.1 kg   Height:       General exam: Appears calm and comfortable  Respiratory system: Clear to auscultation. Respiratory effort normal. Cardiovascular system: S1 & S2 heard, RRR. No JVD,  murmurs, rubs, gallops or clicks. No pedal edema. Gastrointestinal system: Abdomen is nondistended, soft and nontender. No organomegaly or masses felt. Normal bowel sounds heard. Central nervous system: Alert and oriented. No focal neurological deficits. Extremities: Symmetric 5 x 5 power. Skin: No rashes, lesions or ulcers Psychiatry: Judgement and insight appear normal. Mood & affect appropriate.    Data Reviewed:  There are no new results to review at this time.  Family Communication: None  Disposition: Status is: Inpatient Remains inpatient appropriate because: Unsafe discharge option.     Time spent: 25 minutes  Author: Marrion Coy, MD 09/23/2022 11:10 AM  For on call review [email or web address].

## 2022-09-23 NOTE — PMR Pre-admission (Signed)
PMR Admission Coordinator Pre-Admission Assessment  Patient: Diana Quinn is an 57 y.o., female MRN: [id number] DOB: 1965/11/04 Height: 5\' 3"  (160 cm) Weight: 92.1 kg  Insurance Information HMO:     PPO:      PCP:      IPA:      80/20:      OTHER:  PRIMARY: [employer] Plan (medicaid)      Policy#: [id number]      Subscriber: pt CM Name: Chermain      Phone#: [phone] ext [phone]     Fax#: [phone] Pre-Cert#: no prior auth required      Employer:  Benefits:  Phone #: [phone]     Name:  Eff. Date: 7/1/124     Deduct: $0      Out of Pocket Max: $0      Life Max: n/a CIR: 100%      SNF:  Outpatient:      Co-Pay:  Home Health:       Co-Pay:  DME:      Co-Pay:  Providers:  SECONDARY:       Policy#:      Phone#:   Artist:       Phone#:   The Data processing manager" for patients in Inpatient Rehabilitation Facilities with attached "Privacy Act Statement-Health Care Records" was provided and verbally reviewed with: N/A  Emergency Contact Information Contact Information   None on File    Other Contacts     Name Relation Home Work Mobile   nicholson,david Son   [phone]       Current Medical History  Patient Admitting Diagnosis: L distal tiba fx, R ankle fx (both non-operative)   History of Present Illness: Pt is a 57 y/o female with PMH of PTSD, bipolar schizophrenia, anxiety, osteoporosis, DM, CAD s/p MI, seizures, TIA, and DJD admitted to [facility] after a fall at home on 09/09/22.  Pt reports hearing a pop.  Initial vitals WNL, though BP soft at 106/60.  EKG normal other than occasional PVC.  Xrays revealed left distal tibia fracture, mildly displaced, and CT revealed right ankle nondisplaced fracture through medial malleolus and distal lateral calcaneus with possible avulsed fragments on the lateral malleolus/talus.  Podiatry was consulted and recommended non-operative management with restrictions for NWB bilaterally.  This was later  upgraded to allow for minimal weightbearing through RLE in a cam boot with RW for transfers and short distance gait.  Hospital course complicated by onset of AMS on 09/12/22 thought related to medications, ecoli UTI treated with 3 days macrobid.  Therapy ongoing and pt has been recommended for CIR to maximize independence prior to returning home.     Patient's medical record from [facility] has been reviewed by the rehabilitation admission coordinator and physician.  Past Medical History  Past Medical History:  Diagnosis Date   Anemia    Elevated lithium level 05/10/2014   Gastric bypass status for obesity    H/O unstable angina    Hx SBO 09/30/2014   Hypoglycemia    MI (myocardial infarction) (HCC)    Orthostatic hypotension    Osteoporosis    Sciatic nerve disease    Seizures (HCC)    Thyroid disease    TIA (transient ischemic attack) 04/22/2017    Has the patient had major surgery during 100 days prior to admission? Yes  Family History   family history includes Asthma in her sister; COPD in her mother; Cancer in her mother; Epilepsy in her brother;  Hypertension in her mother; Psoriasis in her sister.  Current Medications  Current Facility-Administered Medications:    albuterol (PROVENTIL) (2.5 MG/3ML) 0.083% nebulizer solution 2.5 mg, 2.5 mg, Nebulization, Q2H PRN, Skip Mayer A, MD   atorvastatin (LIPITOR) tablet 40 mg, 40 mg, Oral, Daily, Skip Mayer A, MD, 40 mg at 09/23/22 0900   Chlorhexidine Gluconate Cloth 2 % PADS 6 each, 6 each, Topical, Daily, Delfino Lovett, MD, 6 each at 09/23/22 0900   enoxaparin (LOVENOX) injection 40 mg, 40 mg, Subcutaneous, Q24H, Sherryll Burger, Vipul, MD, 40 mg at 09/22/22 2158   hydrOXYzine (ATARAX) tablet 25 mg, 25 mg, Oral, TID PRN, Marrion Coy, MD, 25 mg at 09/21/22 0526   ibuprofen (ADVIL) tablet 600 mg, 600 mg, Oral, Q6H PRN, Delfino Lovett, MD, 600 mg at 09/23/22 0739   insulin aspart (novoLOG) injection 0-5 Units, 0-5 Units, Subcutaneous, QHS,  Shah, Vipul, MD   insulin aspart (novoLOG) injection 0-9 Units, 0-9 Units, Subcutaneous, TID WC, Delfino Lovett, MD, 1 Units at 09/22/22 1708   levothyroxine (SYNTHROID) tablet 112 mcg, 112 mcg, Oral, Q0600, Lurline Del, MD, 112 mcg at 09/23/22 0618   lumateperone tosylate (CAPLYTA) capsule 42 mg, 42 mg, Oral, Daily, Sarina Ill, DO, 42 mg at 09/23/22 0900   melatonin tablet 5 mg, 5 mg, Oral, QHS PRN, Lurline Del, MD, 5 mg at 09/17/22 0004   multivitamin with minerals tablet 1 tablet, 1 tablet, Oral, Daily, Sarina Ill, DO, 1 tablet at 09/23/22 0900   ondansetron (ZOFRAN) tablet 4 mg, 4 mg, Oral, Q6H PRN, 4 mg at 09/20/22 1424 **OR** ondansetron (ZOFRAN) injection 4 mg, 4 mg, Intravenous, Q6H PRN, Lurline Del, MD, 4 mg at 09/11/22 2352   Oxcarbazepine (TRILEPTAL) tablet 600 mg, 600 mg, Oral, BID, Sherryll Burger, Vipul, MD, 600 mg at 09/23/22 0900   oxyCODONE-acetaminophen (PERCOCET/ROXICET) 5-325 MG per tablet 2 tablet, 2 tablet, Oral, Q4H PRN, Marrion Coy, MD, 2 tablet at 09/23/22 1251   pantoprazole (PROTONIX) EC tablet 40 mg, 40 mg, Oral, Daily, Skip Mayer A, MD, 40 mg at 09/23/22 0900   PARoxetine (PAXIL) tablet 40 mg, 40 mg, Oral, Daily, Skip Mayer A, MD, 40 mg at 09/23/22 0900   prazosin (MINIPRESS) capsule 2 mg, 2 mg, Oral, QHS, Skip Mayer A, MD, 2 mg at 09/22/22 2159   QUEtiapine (SEROQUEL) tablet 300 mg, 300 mg, Oral, QHS, Skip Mayer A, MD, 300 mg at 09/22/22 2159   ranolazine (RANEXA) 12 hr tablet 1,000 mg, 1,000 mg, Oral, BID, Skip Mayer A, MD, 1,000 mg at 09/23/22 0900   thiamine (VITAMIN B1) tablet 100 mg, 100 mg, Oral, Daily, Sarina Ill, DO, 100 mg at 09/23/22 0900   tiZANidine (ZANAFLEX) tablet 4 mg, 4 mg, Oral, TID, Skip Mayer A, MD, 4 mg at 09/23/22 0900   traZODone (DESYREL) tablet 300 mg, 300 mg, Oral, QHS, Skip Mayer A, MD, 300 mg at 09/22/22 2158  Patients Current Diet:  Diet  Order             Diet regular Fluid consistency: Thin  Diet effective now                   Precautions / Restrictions Precautions Precautions: Fall Restrictions Weight Bearing Restrictions: Yes RLE Weight Bearing: Weight bearing as tolerated LLE Weight Bearing: Non weight bearing   Has the patient had 2 or more falls or a fall with injury in the past year? Yes  Prior Activity Level Limited Community (1-2x/wk): mod I at  baseline, frequent falls 2/2 hypotension/hypyglycemia, multiple fractures related to same, not driving, lives with son and his wife  Prior Functional Level Self Care: Did the patient need help bathing, dressing, using the toilet or eating? Independent  Indoor Mobility: Did the patient need assistance with walking from room to room (with or without device)? Independent  Stairs: Did the patient need assistance with internal or external stairs (with or without device)? Independent  Functional Cognition: Did the patient need help planning regular tasks such as shopping or remembering to take medications? Independent  Patient Information Are you of Hispanic, Latino/a,or Spanish origin?: A. No, not of Hispanic, Latino/a, or Spanish origin What is your race?: A. White Do you need or want an interpreter to communicate with a doctor or health care staff?: 0. No  Patient's Response To:  Health Literacy and Transportation Is the patient able to respond to health literacy and transportation needs?: Yes Health Literacy - How often do you need to have someone help you when you read instructions, pamphlets, or other written material from your doctor or pharmacy?: Never In the past 12 months, has lack of transportation kept you from medical appointments or from getting medications?: No In the past 12 months, has lack of transportation kept you from meetings, work, or from getting things needed for daily living?: No  Home Assistive Devices / Equipment Home Assistive  Devices/Equipment: None Home Equipment: Agricultural consultant (2 wheels) (but notes RW is too short for her)  Prior Device Use: Indicate devices/aids used by the patient prior to current illness, exacerbation or injury? None of the above  Current Functional Level Cognition  Overall Cognitive Status: Within Functional Limits for tasks assessed Orientation Level: Oriented X4 General Comments: Difficulty adhering to NWBing status with LLE    Extremity Assessment (includes Sensation/Coordination)  Upper Extremity Assessment: Overall WFL for tasks assessed  Lower Extremity Assessment: Defer to PT evaluation RLE Deficits / Details: Pt with R ankle splint LLE Deficits / Details: Pt with LLE splint rising above posterior knee (requested MD adjust/shorten splint if appropriate). Pt notes L foot is still numb.    ADLs  Overall ADL's : Needs assistance/impaired Lower Body Dressing: Supervision/safety, Bed level, Sitting/lateral leans Lower Body Dressing Details (indicate cue type and reason): Pt performed lower body dressing bed level and sitting EOB with lateral leans. Pt able to respect pain and NWB status with lateral leans while seated. Toilet Transfer: Fish farm manager, Requires drop arm, Minimal assistance Toilet Transfer Details (indicate cue type and reason): Pt transferred to level surface, but terminated attempt that required transfering to elevated surface. Functional mobility during ADLs: Set up General ADL Comments: Supervision for LB ADL long seated in bed.    Mobility  Overal bed mobility: Needs Assistance Bed Mobility: Supine to Sit Supine to sit: Min guard, HOB elevated Sit to supine: Min assist General bed mobility comments: increased time required    Transfers  Overall transfer level: Needs assistance Equipment used: None Transfers: Bed to chair/wheelchair/BSC Sit to Stand: Min assist Bed to/from chair/wheelchair/BSC transfer type:: Lateral/scoot transfer Anterior-Posterior  transfers: Modified independent (Device/Increase time)  Lateral/Scoot Transfers: Min assist General transfer comment: bed height slightly elevated compared to recliner chair. cues for techniques with emphasis on maintaining NWB of LLE with transfers. patient using mostly BUE with occasional support from RLE in CAM boot to complete transfer.    Ambulation / Gait / Stairs / Wheelchair Mobility  Ambulation/Gait General Gait Details: not attempted Naval architect mobility: Yes Wheelchair propulsion: Both  upper extremities Wheelchair parts: Needs assistance Distance: 25 (x 3 bouts) Wheelchair Assistance Details (indicate cue type and reason): patient required physical assistance for removing the leg rest and arm rests in preparation for lateral transfers using the transfer board. she needed intermittent physical assistance to unlock the right break. patient was able to propel around tight spaces with moderate cues for technique. she is able to propel a distance of up to 23ft before requiring rest break due to upper body fatigue    Posture / Balance Dynamic Sitting Balance Sitting balance - Comments: no LOB with scooting today or assist needed Balance Overall balance assessment: Needs assistance Sitting-balance support: No upper extremity supported, Feet supported Sitting balance-Leahy Scale: Good Sitting balance - Comments: no LOB with scooting today or assist needed Standing balance support: Bilateral upper extremity supported Standing balance-Leahy Scale: Fair Standing balance comment: Reliant on RW to stand at EOB    Special needs/care consideration Diabetic management yes   Previous Home Environment (from acute therapy documentation) Living Arrangements: Children Available Help at Discharge: Family, Available 24 hours/day Type of Home: House Home Layout: One level Home Access: Stairs to enter [employer] of Steps: 3 in back, 6 in front Bathroom Shower/Tub:  Tub/shower unit Home Care Services: No Additional Comments: Pt recently moved in with her son, daughter-in-law, & their 3 children. Daughter-in-law home throughout the day, son works. Pt was sleeping on air mattress.  Discharge Living Setting Plans for Discharge Living Setting: Lives with (comment) (son/DIL) Type of Home at Discharge: House Discharge Home Layout: One level Discharge Home Access: Stairs to enter Entrance Stairs-Rails: Right, Left Entrance Stairs-Number of Steps: 3 in back, 6 in front Discharge Bathroom Shower/Tub: Tub/shower unit Discharge Bathroom Toilet: Standard Discharge Bathroom Accessibility: No Does the patient have any problems obtaining your medications?: No  Social/Family/Support Systems Anticipated Caregiver: mod I goals, but son Onalee Hua and his wife are available for supervision Anticipated Caregiver's Contact Information: Onalee Hua [phone] Ability/Limitations of Caregiver: Onalee Hua works 2nd shift, DIL only able to provide supervision 2/2 previous CVA Caregiver Availability: Intermittent Discharge Plan Discussed with Primary Caregiver: Yes Is Caregiver In Agreement with Plan?: Yes Does Caregiver/Family have Issues with Lodging/Transportation while Pt is in Rehab?: No  Goals Patient/Family Goal for Rehab: PT/OT mod I w/c level, supervision short distance ambulation with RW; SLP n/a Expected length of stay: 10-12 days Additional Information: Discharge plan: return to son's home at mod I w/c level.  Son works, and DIL is present during the day but only can provide supervision due to previous CVA Pt/Family Agrees to Admission and willing to participate: Yes Program Orientation Provided & Reviewed with Pt/Caregiver Including Roles  & Responsibilities: Yes  Barriers to Discharge: Insurance for SNF coverage, Home environment access/layout  Decrease burden of Care through IP rehab admission: n/a  Possible need for SNF placement upon discharge: Not anticipated.   Plan for pt to discharge back to her son's home at supervision/mod I level.  He works 2nd shift out of the home.  His spouse is home during the day but can only provide supervision due to health issues.   Patient Condition: I have reviewed medical records from [facility], spoken with CM, and patient. I discussed via phone for inpatient rehabilitation assessment.  Patient will benefit from ongoing PT and OT, can actively participate in 3 hours of therapy a day 5 days of the week, and can make measurable gains during the admission.  Patient will also benefit from the coordinated team approach during an Inpatient  Acute Rehabilitation admission.  The patient will receive intensive therapy as well as Rehabilitation physician, nursing, social worker, and care management interventions.  Due to safety, disease management, medication administration, pain management, and patient education the patient requires 24 hour a day rehabilitation nursing.  The patient is currently min assist with mobility and basic ADLs.  Discharge setting and therapy post discharge at home with home health is anticipated.  Patient has agreed to participate in the Acute Inpatient Rehabilitation Program and will admit today.  Preadmission Screen Completed By:  Stephania Fragmin, PT, DPT 09/23/2022 4:09 PM ______________________________________________________________________   Discussed status with Dr. Shearon Stalls on 09/25/22  at 10:54 AM  and received approval for admission today.  Admission Coordinator:  Stephania Fragmin, PT, DPT time 10:54 AM Dorna Bloom 09/25/22    Assessment/Plan: Diagnosis: Does the need for close, 24 hr/day Medical supervision in concert with the patient's rehab needs make it unreasonable for this patient to be served in a less intensive setting? Yes Co-Morbidities requiring supervision/potential complications:  osteoporosis w/ multiple recent traumatic falls/fractures, poor pain control, hyponatremia, UTI, encephalopathy, DM II with  hypoglycemia, CAD s/p MI, and PTSD/Bipolar/schizophrenia/MDD Due to safety, skin/wound care, disease management, medication administration, pain management, and patient education, does the patient require 24 hr/day rehab nursing? Yes Does the patient require coordinated care of a physician, rehab nurse, PT, OT to address physical and functional deficits in the context of the above medical diagnosis(es)? Yes Addressing deficits in the following areas: balance, endurance, locomotion, strength, transferring, bathing, dressing, grooming, toileting, and psychosocial support Can the patient actively participate in an intensive therapy program of at least 3 hrs of therapy 5 days a week? Yes The potential for patient to make measurable gains while on inpatient rehab is good Anticipated functional outcomes upon discharge from inpatient rehab: modified independent PT, modified independent OT,  Estimated rehab length of stay to reach the above functional goals is: 10-12 days Anticipated discharge destination: Home 10. Overall Rehab/Functional Prognosis: good   MD Signature:  Angelina Sheriff, DO 09/25/2022

## 2022-09-23 NOTE — Progress Notes (Signed)
Inpatient Rehab Coordinator Note:  I spoke with patient over the phone to discuss CIR recommendations and goals/expectations of CIR stay.  We reviewed 3 hrs/day of therapy, physician follow up, and average length of stay 2 weeks (dependent upon progress) with goals of supervision to Mod I.  She described home set up and available support and I do believe that she can reasonably discharge home after 1-2 weeks on rehab.  I explained insurance process and I will start prior auth request today.  Will follow.    Estill Dooms, PT, DPT Admissions Coordinator [phone] 09/23/22  3:06 PM

## 2022-09-23 NOTE — Plan of Care (Signed)
  Problem: Skin Integrity: Goal: Risk for impaired skin integrity will decrease Outcome: Progressing   Problem: Skin Integrity: Goal: Risk for impaired skin integrity will decrease Outcome: Progressing   Problem: Pain Managment: Goal: General experience of comfort will improve Outcome: Progressing   Problem: Coping: Goal: Level of anxiety will decrease Outcome: Progressing   Problem: Education: Goal: Ability to describe self-care measures that may prevent or decrease complications (Diabetes Survival Skills Education) will improve Outcome: Progressing

## 2022-09-24 DIAGNOSIS — S82302A Unspecified fracture of lower end of left tibia, initial encounter for closed fracture: Secondary | ICD-10-CM | POA: Diagnosis not present

## 2022-09-24 DIAGNOSIS — Z87898 Personal history of other specified conditions: Secondary | ICD-10-CM

## 2022-09-24 DIAGNOSIS — S82891S Other fracture of right lower leg, sequela: Secondary | ICD-10-CM | POA: Diagnosis not present

## 2022-09-24 DIAGNOSIS — F331 Major depressive disorder, recurrent, moderate: Secondary | ICD-10-CM | POA: Diagnosis not present

## 2022-09-24 DIAGNOSIS — E871 Hypo-osmolality and hyponatremia: Secondary | ICD-10-CM | POA: Diagnosis not present

## 2022-09-24 HISTORY — DX: Personal history of other specified conditions: Z87.898

## 2022-09-24 LAB — GLUCOSE, CAPILLARY
Glucose-Capillary: 88 mg/dL (ref 70–99)
Glucose-Capillary: 126 mg/dL — ABNORMAL HIGH (ref 70–99)
Glucose-Capillary: 89 mg/dL (ref 70–99)
Glucose-Capillary: 76 mg/dL (ref 70–99)

## 2022-09-24 NOTE — Progress Notes (Signed)
Occupational Therapy Treatment Patient Details Name: Diana Quinn MRN: [id number] DOB: 06/29/1965 Today's Date: 09/24/2022   History of present illness Pt is a 57 y/o F admitted on 09/09/22 after presenting with c/o fall, rolling both ankles inward & hearing a pop. Pt found to have L distal tibial fx nondisplaced & R nondisplaced ankle fx. Podiatry currently recommending conservative care & NWB BLE x 4-6 weeks. PMH: PTSD, bipolar schizophrenia, anxiety, osteoporosis, DM2, CAD s/p MI with non obstructive CAD on cath, seizure disorder, TIA, DJD   OT comments  Diana Quinn was seen for OT treatment on this date. Upon arrival to room pt reclined in bed, reports fatigue and desire to nap (today is also her birthday) however is pleasant and agreeable to condensed session. Pt requires MIN A don/doff CAM boot long sitting in bed - assist for strong velcro. Reviewed importance of boot and doffing at night for comfort. MOD I for bed mobility. Reviewed HEP and updated to include core exercises (sit ups, TA contractions, leg lifts, oblique sit ups) with good return demonstration. Pt making good progress toward goals, will continue to follow POC. Discharge recommendation remains appropriate.     Recommendations for follow up therapy are one component of a multi-disciplinary discharge planning process, led by the attending physician.  Recommendations may be updated based on patient status, additional functional criteria and insurance authorization.    Assistance Recommended at Discharge Frequent or constant Supervision/Assistance  Patient can return home with the following  Help with stairs or ramp for entrance;Two people to help with walking and/or transfers;Two people to help with bathing/dressing/bathroom   Equipment Recommendations  Hospital bed    Recommendations for Other Services      Precautions / Restrictions Precautions Precautions: Fall Restrictions Weight Bearing Restrictions: Yes RLE Weight  Bearing:  (minimal WB RLE in CAM walker w/ walker) LLE Weight Bearing: Non weight bearing       Mobility Bed Mobility Overal bed mobility: Modified Independent             General bed mobility comments: sup<>long sit and sup<>sit    Transfers                   General transfer comment: deferred citing fatigue     Balance Overall balance assessment: Needs assistance Sitting-balance support: No upper extremity supported, Feet supported Sitting balance-Leahy Scale: Good                                     ADL either performed or assessed with clinical judgement   ADL Overall ADL's : Needs assistance/impaired                                       General ADL Comments: MIN A don/doff CAM boot long sitting in bed.      Cognition Arousal/Alertness: Awake/alert Behavior During Therapy: WFL for tasks assessed/performed Overall Cognitive Status: Within Functional Limits for tasks assessed                                          Exercises Other Exercises Other Exercises: reviewed HEP and added core exercises (leg lifts, sit ups, oblique sit ups)  Pertinent Vitals/ Pain       Pain Assessment Pain Assessment: Faces Faces Pain Scale: Hurts little more Pain Location: L ankle Pain Descriptors / Indicators: Aching, Discomfort Pain Intervention(s): Limited activity within patient's tolerance, Repositioned   Frequency  Min 1X/week        Progress Toward Goals  OT Goals(current goals can now be found in the care plan section)  Progress towards OT goals: Progressing toward goals  Acute Rehab OT Goals Patient Stated Goal: improve pain OT Goal Formulation: With patient Time For Goal Achievement: 10/06/22 Potential to Achieve Goals: Good ADL Goals Pt Will Perform Lower Body Dressing: Independently;sitting/lateral leans Pt Will Transfer to Toilet: with modified independence;bedside commode;with  transfer board Pt Will Perform Toileting - Clothing Manipulation and hygiene: sitting/lateral leans;Independently  Plan      Co-evaluation                 AM-PAC OT "6 Clicks" Daily Activity     Outcome Measure   Help from another person eating meals?: None Help from another person taking care of personal grooming?: None Help from another person toileting, which includes using toliet, bedpan, or urinal?: A Lot Help from another person bathing (including washing, rinsing, drying)?: A Lot Help from another person to put on and taking off regular upper body clothing?: A Little Help from another person to put on and taking off regular lower body clothing?: A Lot 6 Click Score: 17    End of Session    OT Visit Diagnosis: Other abnormalities of gait and mobility (R26.89);Muscle weakness (generalized) (M62.81)   Activity Tolerance Patient tolerated treatment well   Patient Left in bed;with call bell/phone within reach   Nurse Communication          Time: 4098-1191 OT Time Calculation (min): 12 min  Charges: OT General Charges $OT Visit: 1 Visit OT Treatments $Self Care/Home Management : 8-22 mins  Kathie Dike, M.S. OTR/L  09/24/22, 10:59 AM  ascom [phone]

## 2022-09-24 NOTE — Assessment & Plan Note (Signed)
 On levothyroxine  ?

## 2022-09-24 NOTE — Assessment & Plan Note (Addendum)
Patient in a cast.  Follow-up podiatry in 2 weeks.

## 2022-09-24 NOTE — Assessment & Plan Note (Addendum)
Sodium now normal at 135

## 2022-09-24 NOTE — Plan of Care (Signed)
  Problem: Education: Goal: Knowledge of General Education information will improve Description: Including pain rating scale, medication(s)/side effects and non-pharmacologic comfort measures Outcome: Progressing   Problem: Health Behavior/Discharge Planning: Goal: Ability to manage health-related needs will improve Outcome: Progressing   Problem: Clinical Measurements: Goal: Ability to maintain clinical measurements within normal limits will improve Outcome: Progressing Goal: Will remain free from infection Outcome: Progressing Goal: Diagnostic test results will improve Outcome: Progressing Goal: Respiratory complications will improve Outcome: Progressing Goal: Cardiovascular complication will be avoided Outcome: Progressing   Problem: Activity: Goal: Risk for activity intolerance will decrease Outcome: Progressing   Problem: Nutrition: Goal: Adequate nutrition will be maintained Outcome: Progressing   Problem: Coping: Goal: Level of anxiety will decrease Outcome: Progressing   Problem: Pain Managment: Goal: General experience of comfort will improve Outcome: Progressing   Problem: Elimination: Goal: Will not experience complications related to bowel motility Outcome: Progressing Goal: Will not experience complications related to urinary retention Outcome: Progressing   Problem: Safety: Goal: Ability to remain free from injury will improve Outcome: Progressing   Problem: Skin Integrity: Goal: Risk for impaired skin integrity will decrease Outcome: Progressing   Problem: Education: Goal: Ability to describe self-care measures that may prevent or decrease complications (Diabetes Survival Skills Education) will improve Outcome: Progressing Goal: Individualized Educational Video(s) Outcome: Progressing   Problem: Coping: Goal: Ability to adjust to condition or change in health will improve Outcome: Progressing   Problem: Fluid Volume: Goal: Ability to  maintain a balanced intake and output will improve Outcome: Progressing   Problem: Health Behavior/Discharge Planning: Goal: Ability to identify and utilize available resources and services will improve Outcome: Progressing Goal: Ability to manage health-related needs will improve Outcome: Progressing   Problem: Metabolic: Goal: Ability to maintain appropriate glucose levels will improve Outcome: Progressing   Problem: Nutritional: Goal: Maintenance of adequate nutrition will improve Outcome: Progressing Goal: Progress toward achieving an optimal weight will improve Outcome: Progressing   Problem: Skin Integrity: Goal: Risk for impaired skin integrity will decrease Outcome: Progressing   Problem: Tissue Perfusion: Goal: Adequacy of tissue perfusion will improve Outcome: Progressing

## 2022-09-24 NOTE — Assessment & Plan Note (Signed)
 Treated

## 2022-09-24 NOTE — Plan of Care (Signed)

## 2022-09-24 NOTE — Assessment & Plan Note (Signed)
On Trileptal

## 2022-09-24 NOTE — Assessment & Plan Note (Signed)
Improved with treatment of UTI

## 2022-09-24 NOTE — Assessment & Plan Note (Signed)
Currently on Paxil, Seroquel, does not rule and Trileptal and Calypta

## 2022-09-24 NOTE — Assessment & Plan Note (Addendum)
CT showing a nondisplaced fracture through the right medial malleolus and distal lateral calcaneus.  Patient in splint.  Follow-up with podiatry in 2 weeks.

## 2022-09-24 NOTE — Progress Notes (Signed)
  Progress Note   Patient: Diana Quinn WUJ:[id number] DOB: [birth year] DOA: 09/09/2022     14 DOS: the patient was seen and examined on 09/24/2022   Brief hospital course: 57 y.o. female with medical history significant of  PTSD/Bipolar/Schizophrenia, anxiety,Osteoporosis, DMII with history of hypogylcemia, CAD s/p MI with non-obstructive CAD on Cath per patient, Seizure d/o djd for the back , TIA , hx of gastric bypass for treatment of obesity.  She is admitted for nondisplaced medial malleolus and distal lateral calcaneus fracture s/p mechanical fall   Patient is seen by podiatry, not a surgical candidate.  But deemed to be nonweightbearing for bilateral legs.  Awaiting insurance authorization for acute inpatient rehab.  Assessment and Plan: * Ankle fracture CT showing a nondisplaced fracture through the right medial malleolus and distal lateral calcaneus.  Patient in splint.  Closed fracture of left distal tibia Patient in a cast.  Major depressive disorder, recurrent episode, moderate (HCC) Currently on Paxil, Seroquel, does not rule and Trileptal and Calypta  Hyponatremia Sodium 131  E. coli UTI Treated  Acute metabolic encephalopathy Improved with treatment of UTI.  History of seizure On Trileptal  Hypothyroidism On levothyroxine        Subjective: Patient feels okay.  Nonweightbearing bilateral legs.  Admitted with left distal tibia fracture and right ankle fracture  Physical Exam: Vitals:   09/23/22 1420 09/23/22 2248 09/24/22 0448 09/24/22 0724  BP: (!) 109/58 (!) 108/91  (!) 126/96  Pulse: 80 84  73  Resp: 14 20  14   Temp:  99.1 F (37.3 C)  98.2 F (36.8 C)  TempSrc:      SpO2: 94% 100%  100%  Weight:   92.3 kg   Height:       Physical Exam HENT:     Head: Normocephalic.     Mouth/Throat:     Pharynx: No oropharyngeal exudate.  Eyes:     General: Lids are normal.     Conjunctiva/sclera: Conjunctivae normal.  Cardiovascular:     Rate and Rhythm:  Normal rate and regular rhythm.     Heart sounds: Normal heart sounds, S1 normal and S2 normal.  Pulmonary:     Breath sounds: No decreased breath sounds, wheezing, rhonchi or rales.  Abdominal:     Palpations: Abdomen is soft.     Tenderness: There is no abdominal tenderness.  Musculoskeletal:     Right lower leg: No swelling.     Left lower leg: No swelling.  Skin:    General: Skin is warm.     Findings: No rash.  Neurological:     Mental Status: She is alert and oriented to person, place, and time.     Comments: Able to wiggle toes bilaterally.     Data Reviewed: Last sodium 131 last creatinine 0.64 last hemoglobin 11.0   Disposition: Status is: Inpatient Remains inpatient appropriate because: Awaiting insurance authorization for acute inpatient rehab and bed availability.  Planned Discharge Destination: Acute inpatient rehab    Time spent: 28 minutes  Author: Alford Highland, MD 09/24/2022 2:56 PM  For on call review [email or web address].

## 2022-09-24 NOTE — Progress Notes (Addendum)
Inpatient Rehab Admissions Coordinator:   Insurance auth pending. Will follow.   1215: notified that prior auth not required for CIR.  I am waiting to hear back from pt's son to discuss plan.   Estill Dooms, PT, DPT Admissions Coordinator [phone] 09/24/22  10:18 AM

## 2022-09-24 NOTE — Progress Notes (Signed)
PT Cancellation Note  Patient Details Name: Jaycee Mckellips MRN: [id number] DOB: 1965/06/01   Cancelled Treatment:    Reason Eval/Treat Not Completed: Other (comment) (Patient requesting PT come at a different time as she is groggy from medications. She is still hopeful to be discharged to rehab soon. PT will continue with attempts.)  Donna Bernard, PT, MPT  Ina Homes 09/24/2022, 2:31 PM

## 2022-09-25 ENCOUNTER — Inpatient Hospital Stay (HOSPITAL_COMMUNITY)
Admission: RE | Admit: 2022-09-25 | Discharge: 2022-10-07 | DRG: 560 | Disposition: A | Discharge status: Home Health | Payer: MEDICAID | Source: Other Acute Inpatient Hospital | Attending: Physical Medicine & Rehabilitation | Admitting: Physical Medicine & Rehabilitation

## 2022-09-25 ENCOUNTER — Other Ambulatory Visit: Payer: Self-pay

## 2022-09-25 ENCOUNTER — Encounter: Payer: Self-pay | Admitting: Oncology

## 2022-09-25 ENCOUNTER — Encounter (HOSPITAL_COMMUNITY): Payer: Self-pay | Admitting: Physical Medicine & Rehabilitation

## 2022-09-25 DIAGNOSIS — F4310 Post-traumatic stress disorder, unspecified: Secondary | ICD-10-CM | POA: Diagnosis present

## 2022-09-25 DIAGNOSIS — E875 Hyperkalemia: Secondary | ICD-10-CM | POA: Diagnosis present

## 2022-09-25 DIAGNOSIS — E669 Obesity, unspecified: Secondary | ICD-10-CM | POA: Diagnosis present

## 2022-09-25 DIAGNOSIS — G4700 Insomnia, unspecified: Secondary | ICD-10-CM | POA: Diagnosis not present

## 2022-09-25 DIAGNOSIS — D638 Anemia in other chronic diseases classified elsewhere: Secondary | ICD-10-CM | POA: Diagnosis present

## 2022-09-25 DIAGNOSIS — E876 Hypokalemia: Secondary | ICD-10-CM | POA: Diagnosis not present

## 2022-09-25 DIAGNOSIS — E1165 Type 2 diabetes mellitus with hyperglycemia: Secondary | ICD-10-CM | POA: Diagnosis not present

## 2022-09-25 DIAGNOSIS — G40909 Epilepsy, unspecified, not intractable, without status epilepticus: Secondary | ICD-10-CM | POA: Diagnosis present

## 2022-09-25 DIAGNOSIS — S82302S Unspecified fracture of lower end of left tibia, sequela: Secondary | ICD-10-CM | POA: Diagnosis not present

## 2022-09-25 DIAGNOSIS — E222 Syndrome of inappropriate secretion of antidiuretic hormone: Secondary | ICD-10-CM | POA: Diagnosis present

## 2022-09-25 DIAGNOSIS — F5104 Psychophysiologic insomnia: Secondary | ICD-10-CM | POA: Diagnosis present

## 2022-09-25 DIAGNOSIS — Z9884 Bariatric surgery status: Secondary | ICD-10-CM | POA: Diagnosis not present

## 2022-09-25 DIAGNOSIS — K912 Postsurgical malabsorption, not elsewhere classified: Secondary | ICD-10-CM | POA: Diagnosis not present

## 2022-09-25 DIAGNOSIS — M7989 Other specified soft tissue disorders: Secondary | ICD-10-CM | POA: Diagnosis not present

## 2022-09-25 DIAGNOSIS — G8918 Other acute postprocedural pain: Secondary | ICD-10-CM | POA: Diagnosis not present

## 2022-09-25 DIAGNOSIS — S82891A Other fracture of right lower leg, initial encounter for closed fracture: Secondary | ICD-10-CM | POA: Diagnosis not present

## 2022-09-25 DIAGNOSIS — F3162 Bipolar disorder, current episode mixed, moderate: Secondary | ICD-10-CM | POA: Diagnosis not present

## 2022-09-25 DIAGNOSIS — Z8673 Personal history of transient ischemic attack (TIA), and cerebral infarction without residual deficits: Secondary | ICD-10-CM | POA: Diagnosis not present

## 2022-09-25 DIAGNOSIS — S2220XD Unspecified fracture of sternum, subsequent encounter for fracture with routine healing: Secondary | ICD-10-CM

## 2022-09-25 DIAGNOSIS — Z79890 Hormone replacement therapy (postmenopausal): Secondary | ICD-10-CM | POA: Diagnosis not present

## 2022-09-25 DIAGNOSIS — E785 Hyperlipidemia, unspecified: Secondary | ICD-10-CM | POA: Diagnosis present

## 2022-09-25 DIAGNOSIS — S82302D Unspecified fracture of lower end of left tibia, subsequent encounter for closed fracture with routine healing: Principal | ICD-10-CM

## 2022-09-25 DIAGNOSIS — R0789 Other chest pain: Secondary | ICD-10-CM

## 2022-09-25 DIAGNOSIS — S82891D Other fracture of right lower leg, subsequent encounter for closed fracture with routine healing: Secondary | ICD-10-CM | POA: Diagnosis not present

## 2022-09-25 DIAGNOSIS — Z825 Family history of asthma and other chronic lower respiratory diseases: Secondary | ICD-10-CM

## 2022-09-25 DIAGNOSIS — Z79899 Other long term (current) drug therapy: Secondary | ICD-10-CM | POA: Diagnosis not present

## 2022-09-25 DIAGNOSIS — Z90710 Acquired absence of both cervix and uterus: Secondary | ICD-10-CM

## 2022-09-25 DIAGNOSIS — D75839 Thrombocytosis, unspecified: Secondary | ICD-10-CM | POA: Diagnosis not present

## 2022-09-25 DIAGNOSIS — M25579 Pain in unspecified ankle and joints of unspecified foot: Secondary | ICD-10-CM | POA: Diagnosis not present

## 2022-09-25 DIAGNOSIS — K5900 Constipation, unspecified: Secondary | ICD-10-CM

## 2022-09-25 DIAGNOSIS — F209 Schizophrenia, unspecified: Secondary | ICD-10-CM | POA: Diagnosis present

## 2022-09-25 DIAGNOSIS — K582 Mixed irritable bowel syndrome: Secondary | ICD-10-CM | POA: Diagnosis present

## 2022-09-25 DIAGNOSIS — F331 Major depressive disorder, recurrent, moderate: Secondary | ICD-10-CM | POA: Diagnosis not present

## 2022-09-25 DIAGNOSIS — I252 Old myocardial infarction: Secondary | ICD-10-CM | POA: Diagnosis not present

## 2022-09-25 DIAGNOSIS — Z809 Family history of malignant neoplasm, unspecified: Secondary | ICD-10-CM

## 2022-09-25 DIAGNOSIS — E871 Hypo-osmolality and hyponatremia: Secondary | ICD-10-CM | POA: Diagnosis not present

## 2022-09-25 DIAGNOSIS — E039 Hypothyroidism, unspecified: Secondary | ICD-10-CM | POA: Diagnosis present

## 2022-09-25 DIAGNOSIS — Z8249 Family history of ischemic heart disease and other diseases of the circulatory system: Secondary | ICD-10-CM | POA: Diagnosis not present

## 2022-09-25 DIAGNOSIS — W1830XD Fall on same level, unspecified, subsequent encounter: Secondary | ICD-10-CM | POA: Diagnosis not present

## 2022-09-25 DIAGNOSIS — S82891S Other fracture of right lower leg, sequela: Secondary | ICD-10-CM | POA: Diagnosis not present

## 2022-09-25 DIAGNOSIS — Z794 Long term (current) use of insulin: Secondary | ICD-10-CM | POA: Diagnosis not present

## 2022-09-25 DIAGNOSIS — S82892A Other fracture of left lower leg, initial encounter for closed fracture: Secondary | ICD-10-CM

## 2022-09-25 DIAGNOSIS — D508 Other iron deficiency anemias: Secondary | ICD-10-CM

## 2022-09-25 DIAGNOSIS — Z888 Allergy status to other drugs, medicaments and biological substances status: Secondary | ICD-10-CM

## 2022-09-25 DIAGNOSIS — Z820 Family history of epilepsy and other diseases of the nervous system: Secondary | ICD-10-CM

## 2022-09-25 DIAGNOSIS — R0781 Pleurodynia: Secondary | ICD-10-CM | POA: Diagnosis not present

## 2022-09-25 DIAGNOSIS — S82892D Other fracture of left lower leg, subsequent encounter for closed fracture with routine healing: Secondary | ICD-10-CM | POA: Diagnosis not present

## 2022-09-25 DIAGNOSIS — G8929 Other chronic pain: Secondary | ICD-10-CM

## 2022-09-25 DIAGNOSIS — M810 Age-related osteoporosis without current pathological fracture: Secondary | ICD-10-CM | POA: Diagnosis present

## 2022-09-25 DIAGNOSIS — G9341 Metabolic encephalopathy: Secondary | ICD-10-CM | POA: Diagnosis not present

## 2022-09-25 DIAGNOSIS — S82892S Other fracture of left lower leg, sequela: Secondary | ICD-10-CM | POA: Diagnosis not present

## 2022-09-25 DIAGNOSIS — Z6836 Body mass index (BMI) 36.0-36.9, adult: Secondary | ICD-10-CM

## 2022-09-25 DIAGNOSIS — M7918 Myalgia, other site: Secondary | ICD-10-CM

## 2022-09-25 HISTORY — DX: Other fracture of right lower leg, initial encounter for closed fracture: S82.891A

## 2022-09-25 LAB — GLUCOSE, CAPILLARY
Glucose-Capillary: 83 mg/dL (ref 70–99)
Glucose-Capillary: 70 mg/dL (ref 70–99)
Glucose-Capillary: 94 mg/dL (ref 70–99)
Glucose-Capillary: 99 mg/dL (ref 70–99)

## 2022-09-25 LAB — CBC
HCT: 29.5 % — ABNORMAL LOW (ref 36.0–46.0)
Hemoglobin: 9.7 g/dL — ABNORMAL LOW (ref 12.0–15.0)
MCH: 32 pg (ref 26.0–34.0)
MCHC: 32.9 g/dL (ref 30.0–36.0)
MCV: 97.4 fL (ref 80.0–100.0)
Platelets: 552 10*3/uL — ABNORMAL HIGH (ref 150–400)
RBC: 3.03 MIL/uL — ABNORMAL LOW (ref 3.87–5.11)
RDW: 14.5 % (ref 11.5–15.5)
WBC: 8.1 10*3/uL (ref 4.0–10.5)
nRBC: 0 % (ref 0.0–0.2)

## 2022-09-25 LAB — BASIC METABOLIC PANEL
Anion gap: 9 (ref 5–15)
BUN: 13 mg/dL (ref 6–20)
CO2: 23 mmol/L (ref 22–32)
Calcium: 8.1 mg/dL — ABNORMAL LOW (ref 8.9–10.3)
Chloride: 103 mmol/L (ref 98–111)
Creatinine, Ser: 0.63 mg/dL (ref 0.44–1.00)
GFR, Estimated: >60 mL/min (ref >60)
Glucose, Bld: 138 mg/dL — ABNORMAL HIGH (ref 70–99)
Potassium: 3.6 mmol/L (ref 3.5–5.1)
Sodium: 135 mmol/L (ref 135–145)

## 2022-09-25 MED ORDER — OXYCODONE HCL 5 MG PO TABS
10.0000 mg | ORAL_TABLET | Freq: Four times a day (QID) | ORAL | Status: DC | PRN
  Administered 2022-09-25 – 2022-09-30 (×17): 10 mg via ORAL
  Filled 2022-09-25 (×17): qty 2

## 2022-09-25 MED ORDER — ENOXAPARIN SODIUM 40 MG/0.4ML IJ SOSY: 40.0000 mg | PREFILLED_SYRINGE | INTRAMUSCULAR | 0 refills | Status: DC

## 2022-09-25 MED ORDER — THIAMINE MONONITRATE 100 MG PO TABS
100.0000 mg | ORAL_TABLET | Freq: Every day | ORAL | Status: DC
  Administered 2022-09-26 – 2022-10-07 (×12): 100 mg via ORAL
  Filled 2022-09-25 (×12): qty 1

## 2022-09-25 MED ORDER — TIZANIDINE HCL 4 MG PO TABS
4.0000 mg | ORAL_TABLET | Freq: Three times a day (TID) | ORAL | Status: DC
  Administered 2022-09-25 – 2022-10-07 (×35): 4 mg via ORAL
  Filled 2022-09-25 (×35): qty 1

## 2022-09-25 MED ORDER — FERROUS SULFATE 325 (65 FE) MG PO TABS: 325.0000 mg | ORAL_TABLET | Freq: Every day | ORAL | 0 refills | Status: DC

## 2022-09-25 MED ORDER — HYDROXYZINE HCL 25 MG PO TABS
25.0000 mg | ORAL_TABLET | Freq: Three times a day (TID) | ORAL | Status: DC | PRN
  Administered 2022-09-29 – 2022-10-06 (×6): 25 mg via ORAL
  Filled 2022-09-25 (×6): qty 1

## 2022-09-25 MED ORDER — ACETAMINOPHEN 325 MG PO TABS: 650.0000 mg | ORAL_TABLET | Freq: Four times a day (QID) | ORAL | Status: AC | PRN

## 2022-09-25 MED ORDER — QUETIAPINE FUMARATE 50 MG PO TABS
300.0000 mg | ORAL_TABLET | Freq: Every day | ORAL | Status: DC
  Administered 2022-09-25 – 2022-10-06 (×12): 300 mg via ORAL
  Filled 2022-09-25 (×12): qty 6

## 2022-09-25 MED ORDER — LUMATEPERONE TOSYLATE 42 MG PO CAPS
42.0000 mg | ORAL_CAPSULE | Freq: Every day | ORAL | Status: DC
  Administered 2022-09-26 – 2022-10-07 (×12): 42 mg via ORAL
  Filled 2022-09-25 (×12): qty 1

## 2022-09-25 MED ORDER — PRAZOSIN HCL 2 MG PO CAPS
2.0000 mg | ORAL_CAPSULE | Freq: Every day | ORAL | Status: DC
  Administered 2022-09-25 – 2022-10-06 (×11): 2 mg via ORAL
  Filled 2022-09-25 (×13): qty 1

## 2022-09-25 MED ORDER — IBUPROFEN 600 MG PO TABS
600.0000 mg | ORAL_TABLET | Freq: Four times a day (QID) | ORAL | Status: DC | PRN
  Administered 2022-09-26 – 2022-10-07 (×12): 600 mg via ORAL
  Filled 2022-09-25 (×12): qty 1

## 2022-09-25 MED ORDER — MELATONIN 5 MG PO TABS: 5.0000 mg | ORAL_TABLET | Freq: Every evening | ORAL | Status: DC | PRN

## 2022-09-25 MED ORDER — INSULIN ASPART 100 UNIT/ML IJ SOLN
0.0000 [IU] | Freq: Three times a day (TID) | INTRAMUSCULAR | Status: DC
  Administered 2022-09-28 – 2022-09-30 (×2): 1 [IU] via SUBCUTANEOUS

## 2022-09-25 MED ORDER — ADULT MULTIVITAMIN W/MINERALS CH: 1.0000 | ORAL_TABLET | Freq: Every day | ORAL | Status: AC

## 2022-09-25 MED ORDER — ACETAMINOPHEN 325 MG PO TABS
650.0000 mg | ORAL_TABLET | Freq: Four times a day (QID) | ORAL | Status: DC | PRN
  Administered 2022-10-03: 650 mg via ORAL
  Filled 2022-09-25 (×2): qty 2

## 2022-09-25 MED ORDER — ONDANSETRON HCL 4 MG/2ML IJ SOLN: 4.0000 mg | Freq: Four times a day (QID) | INTRAMUSCULAR | Status: DC | PRN

## 2022-09-25 MED ORDER — ALBUTEROL SULFATE (2.5 MG/3ML) 0.083% IN NEBU: 2.5000 mg | INHALATION_SOLUTION | RESPIRATORY_TRACT | Status: DC | PRN

## 2022-09-25 MED ORDER — LEVOTHYROXINE SODIUM 112 MCG PO TABS
112.0000 ug | ORAL_TABLET | Freq: Every day | ORAL | Status: DC
  Administered 2022-09-26 – 2022-10-07 (×12): 112 ug via ORAL
  Filled 2022-09-25 (×13): qty 1

## 2022-09-25 MED ORDER — TRAZODONE HCL 50 MG PO TABS
300.0000 mg | ORAL_TABLET | Freq: Every day | ORAL | Status: DC
  Administered 2022-09-25 – 2022-10-06 (×12): 300 mg via ORAL
  Filled 2022-09-25 (×12): qty 6

## 2022-09-25 MED ORDER — ADULT MULTIVITAMIN W/MINERALS CH
1.0000 | ORAL_TABLET | Freq: Every day | ORAL | Status: DC
  Administered 2022-09-26 – 2022-10-07 (×12): 1 via ORAL
  Filled 2022-09-25 (×12): qty 1

## 2022-09-25 MED ORDER — PAROXETINE HCL 20 MG PO TABS
40.0000 mg | ORAL_TABLET | Freq: Every day | ORAL | Status: DC
  Administered 2022-09-26 – 2022-10-07 (×12): 40 mg via ORAL
  Filled 2022-09-25 (×12): qty 2

## 2022-09-25 MED ORDER — OXCARBAZEPINE 600 MG PO TABS: 600.0000 mg | ORAL_TABLET | Freq: Two times a day (BID) | ORAL | 0 refills | Status: DC

## 2022-09-25 MED ORDER — OXYCODONE HCL 10 MG PO TABS: 10.0000 mg | ORAL_TABLET | Freq: Four times a day (QID) | ORAL | 0 refills | Status: DC | PRN

## 2022-09-25 MED ORDER — OXYCODONE HCL 5 MG PO TABS
10.0000 mg | ORAL_TABLET | Freq: Four times a day (QID) | ORAL | Status: DC | PRN
  Administered 2022-09-25: 10 mg via ORAL
  Filled 2022-09-25: qty 2

## 2022-09-25 MED ORDER — FERROUS SULFATE 325 (65 FE) MG PO TABS
325.0000 mg | ORAL_TABLET | Freq: Every day | ORAL | Status: DC
  Administered 2022-09-26 – 2022-10-07 (×12): 325 mg via ORAL
  Filled 2022-09-25 (×12): qty 1

## 2022-09-25 MED ORDER — ATORVASTATIN CALCIUM 40 MG PO TABS
40.0000 mg | ORAL_TABLET | Freq: Every day | ORAL | Status: DC
  Administered 2022-09-26 – 2022-10-07 (×12): 40 mg via ORAL
  Filled 2022-09-25 (×12): qty 1

## 2022-09-25 MED ORDER — VITAMIN B-1 100 MG PO TABS: 100.0000 mg | ORAL_TABLET | Freq: Every day | ORAL | 0 refills | Status: DC

## 2022-09-25 MED ORDER — ENOXAPARIN SODIUM 40 MG/0.4ML IJ SOSY
40.0000 mg | PREFILLED_SYRINGE | INTRAMUSCULAR | Status: DC
  Administered 2022-09-25 – 2022-10-06 (×12): 40 mg via SUBCUTANEOUS
  Filled 2022-09-25 (×12): qty 0.4

## 2022-09-25 MED ORDER — ACETAMINOPHEN 325 MG PO TABS: 650.0000 mg | ORAL_TABLET | Freq: Four times a day (QID) | ORAL | Status: DC | PRN

## 2022-09-25 MED ORDER — FERROUS SULFATE 325 (65 FE) MG PO TABS: 325.0000 mg | ORAL_TABLET | Freq: Every day | ORAL | Status: DC

## 2022-09-25 MED ORDER — ENOXAPARIN SODIUM 40 MG/0.4ML IJ SOSY: 40.0000 mg | PREFILLED_SYRINGE | INTRAMUSCULAR | Status: DC

## 2022-09-25 MED ORDER — PANTOPRAZOLE SODIUM 40 MG PO TBEC
40.0000 mg | DELAYED_RELEASE_TABLET | Freq: Every day | ORAL | Status: DC
  Administered 2022-09-26 – 2022-10-07 (×12): 40 mg via ORAL
  Filled 2022-09-25 (×12): qty 1

## 2022-09-25 MED ORDER — RANOLAZINE ER 500 MG PO TB12
1000.0000 mg | ORAL_TABLET | Freq: Two times a day (BID) | ORAL | Status: DC
  Administered 2022-09-25 – 2022-10-07 (×24): 1000 mg via ORAL
  Filled 2022-09-25 (×25): qty 2

## 2022-09-25 MED ORDER — TIZANIDINE HCL 4 MG PO TABS: 4.0000 mg | ORAL_TABLET | Freq: Three times a day (TID) | ORAL | 0 refills | Status: DC

## 2022-09-25 MED ORDER — OXCARBAZEPINE 150 MG PO TABS
600.0000 mg | ORAL_TABLET | Freq: Two times a day (BID) | ORAL | Status: DC
  Administered 2022-09-25 – 2022-10-07 (×24): 600 mg via ORAL
  Filled 2022-09-25 (×24): qty 4

## 2022-09-25 MED ORDER — QUETIAPINE FUMARATE 100 MG PO TABS: 300.0000 mg | ORAL_TABLET | Freq: Every day | ORAL | 0 refills | Status: DC

## 2022-09-25 MED ORDER — ONDANSETRON HCL 4 MG PO TABS: 4.0000 mg | ORAL_TABLET | Freq: Four times a day (QID) | ORAL | Status: DC | PRN

## 2022-09-25 NOTE — Progress Notes (Signed)
PMR Admission Coordinator Pre-Admission Assessment   Patient: Diana Quinn is an 57 y.o., female MRN: [id number] DOB: 17-Nov-1965 Height: 5\' 3"  (160 cm) Weight: 92.1 kg   Insurance Information HMO:     PPO:      PCP:      IPA:      80/20:      OTHER:  PRIMARY: [employer] Plan (medicaid)      Policy#: [id number]      Subscriber: pt CM Name: Chermain      Phone#: [phone] ext [phone]     Fax#: [phone] Pre-Cert#: no prior auth required      Employer:  Benefits:  Phone #: [phone]     Name:  Eff. Date: 7/1/124     Deduct: $0      Out of Pocket Max: $0      Life Max: n/a CIR: 100%      SNF:  Outpatient:      Co-Pay:  Home Health:       Co-Pay:  DME:      Co-Pay:  Providers:  SECONDARY:       Policy#:      Phone#:    Artist:       Phone#:    The Data processing manager" for patients in Inpatient Rehabilitation Facilities with attached "Privacy Act Statement-Health Care Records" was provided and verbally reviewed with: N/A   Emergency Contact Information Contact Information   None on File      Other Contacts       Name Relation Home Work Mobile    nicholson,david Son     [phone]           Current Medical History  Patient Admitting Diagnosis: L distal tiba fx, R ankle fx (both non-operative)    History of Present Illness: Pt is a 57 y/o female with PMH of PTSD, bipolar schizophrenia, anxiety, osteoporosis, DM, CAD s/p MI, seizures, TIA, and DJD admitted to [facility] after a fall at home on 09/09/22.  Pt reports hearing a pop.  Initial vitals WNL, though BP soft at 106/60.  EKG normal other than occasional PVC.  Xrays revealed left distal tibia fracture, mildly displaced, and CT revealed right ankle nondisplaced fracture through medial malleolus and distal lateral calcaneus with possible avulsed fragments on the lateral malleolus/talus.  Podiatry was consulted and recommended non-operative management with restrictions for NWB bilaterally.   This was later upgraded to allow for minimal weightbearing through RLE in a cam boot with RW for transfers and short distance gait.  Hospital course complicated by onset of AMS on 09/12/22 thought related to medications, ecoli UTI treated with 3 days macrobid.  Therapy ongoing and pt has been recommended for CIR to maximize independence prior to returning home.    Patient's medical record from [facility] has been reviewed by the rehabilitation admission coordinator and physician.   Past Medical History      Past Medical History:  Diagnosis Date   Anemia     Elevated lithium level 05/10/2014   Gastric bypass status for obesity     H/O unstable angina     Hx SBO 09/30/2014   Hypoglycemia     MI (myocardial infarction) (HCC)     Orthostatic hypotension     Osteoporosis     Sciatic nerve disease     Seizures (HCC)     Thyroid disease     TIA (transient ischemic attack) 04/22/2017  Has the patient had major surgery during 100 days prior to admission? Yes   Family History   family history includes Asthma in her sister; COPD in her mother; Cancer in her mother; Epilepsy in her brother; Hypertension in her mother; Psoriasis in her sister.   Current Medications  Current Medications    Current Facility-Administered Medications:    albuterol (PROVENTIL) (2.5 MG/3ML) 0.083% nebulizer solution 2.5 mg, 2.5 mg, Nebulization, Q2H PRN, Skip Mayer A, MD   atorvastatin (LIPITOR) tablet 40 mg, 40 mg, Oral, Daily, Skip Mayer A, MD, 40 mg at 09/23/22 0900   Chlorhexidine Gluconate Cloth 2 % PADS 6 each, 6 each, Topical, Daily, Delfino Lovett, MD, 6 each at 09/23/22 0900   enoxaparin (LOVENOX) injection 40 mg, 40 mg, Subcutaneous, Q24H, Sherryll Burger, Vipul, MD, 40 mg at 09/22/22 2158   hydrOXYzine (ATARAX) tablet 25 mg, 25 mg, Oral, TID PRN, Marrion Coy, MD, 25 mg at 09/21/22 0526   ibuprofen (ADVIL) tablet 600 mg, 600 mg, Oral, Q6H PRN, Delfino Lovett, MD, 600 mg at 09/23/22 0739   insulin aspart  (novoLOG) injection 0-5 Units, 0-5 Units, Subcutaneous, QHS, Shah, Vipul, MD   insulin aspart (novoLOG) injection 0-9 Units, 0-9 Units, Subcutaneous, TID WC, Delfino Lovett, MD, 1 Units at 09/22/22 1708   levothyroxine (SYNTHROID) tablet 112 mcg, 112 mcg, Oral, Q0600, Lurline Del, MD, 112 mcg at 09/23/22 0618   lumateperone tosylate (CAPLYTA) capsule 42 mg, 42 mg, Oral, Daily, Sarina Ill, DO, 42 mg at 09/23/22 0900   melatonin tablet 5 mg, 5 mg, Oral, QHS PRN, Lurline Del, MD, 5 mg at 09/17/22 0004   multivitamin with minerals tablet 1 tablet, 1 tablet, Oral, Daily, Sarina Ill, DO, 1 tablet at 09/23/22 0900   ondansetron (ZOFRAN) tablet 4 mg, 4 mg, Oral, Q6H PRN, 4 mg at 09/20/22 1424 **OR** ondansetron (ZOFRAN) injection 4 mg, 4 mg, Intravenous, Q6H PRN, Lurline Del, MD, 4 mg at 09/11/22 2352   Oxcarbazepine (TRILEPTAL) tablet 600 mg, 600 mg, Oral, BID, Sherryll Burger, Vipul, MD, 600 mg at 09/23/22 0900   oxyCODONE-acetaminophen (PERCOCET/ROXICET) 5-325 MG per tablet 2 tablet, 2 tablet, Oral, Q4H PRN, Marrion Coy, MD, 2 tablet at 09/23/22 1251   pantoprazole (PROTONIX) EC tablet 40 mg, 40 mg, Oral, Daily, Skip Mayer A, MD, 40 mg at 09/23/22 0900   PARoxetine (PAXIL) tablet 40 mg, 40 mg, Oral, Daily, Skip Mayer A, MD, 40 mg at 09/23/22 0900   prazosin (MINIPRESS) capsule 2 mg, 2 mg, Oral, QHS, Skip Mayer A, MD, 2 mg at 09/22/22 2159   QUEtiapine (SEROQUEL) tablet 300 mg, 300 mg, Oral, QHS, Skip Mayer A, MD, 300 mg at 09/22/22 2159   ranolazine (RANEXA) 12 hr tablet 1,000 mg, 1,000 mg, Oral, BID, Skip Mayer A, MD, 1,000 mg at 09/23/22 0900   thiamine (VITAMIN B1) tablet 100 mg, 100 mg, Oral, Daily, Sarina Ill, DO, 100 mg at 09/23/22 0900   tiZANidine (ZANAFLEX) tablet 4 mg, 4 mg, Oral, TID, Skip Mayer A, MD, 4 mg at 09/23/22 0900   traZODone (DESYREL) tablet 300 mg, 300 mg, Oral, QHS, Skip Mayer A, MD,  300 mg at 09/22/22 2158     Patients Current Diet:  Diet Order                  Diet regular Fluid consistency: Thin  Diet effective now  Precautions / Restrictions Precautions Precautions: Fall Restrictions Weight Bearing Restrictions: Yes RLE Weight Bearing: Weight bearing as tolerated LLE Weight Bearing: Non weight bearing    Has the patient had 2 or more falls or a fall with injury in the past year? Yes   Prior Activity Level Limited Community (1-2x/wk): mod I at baseline, frequent falls 2/2 hypotension/hypyglycemia, multiple fractures related to same, not driving, lives with son and his wife   Prior Functional Level Self Care: Did the patient need help bathing, dressing, using the toilet or eating? Independent   Indoor Mobility: Did the patient need assistance with walking from room to room (with or without device)? Independent   Stairs: Did the patient need assistance with internal or external stairs (with or without device)? Independent   Functional Cognition: Did the patient need help planning regular tasks such as shopping or remembering to take medications? Independent   Patient Information Are you of Hispanic, Latino/a,or Spanish origin?: A. No, not of Hispanic, Latino/a, or Spanish origin What is your race?: A. White Do you need or want an interpreter to communicate with a doctor or health care staff?: 0. No   Patient's Response To:  Health Literacy and Transportation Is the patient able to respond to health literacy and transportation needs?: Yes Health Literacy - How often do you need to have someone help you when you read instructions, pamphlets, or other written material from your doctor or pharmacy?: Never In the past 12 months, has lack of transportation kept you from medical appointments or from getting medications?: No In the past 12 months, has lack of transportation kept you from meetings, work, or from getting things needed  for daily living?: No   Home Assistive Devices / Equipment Home Assistive Devices/Equipment: None Home Equipment: Agricultural consultant (2 wheels) (but notes RW is too short for her)   Prior Device Use: Indicate devices/aids used by the patient prior to current illness, exacerbation or injury? None of the above   Current Functional Level Cognition   Overall Cognitive Status: Within Functional Limits for tasks assessed Orientation Level: Oriented X4 General Comments: Difficulty adhering to NWBing status with LLE    Extremity Assessment (includes Sensation/Coordination)   Upper Extremity Assessment: Overall WFL for tasks assessed  Lower Extremity Assessment: Defer to PT evaluation RLE Deficits / Details: Pt with R ankle splint LLE Deficits / Details: Pt with LLE splint rising above posterior knee (requested MD adjust/shorten splint if appropriate). Pt notes L foot is still numb.     ADLs   Overall ADL's : Needs assistance/impaired Lower Body Dressing: Supervision/safety, Bed level, Sitting/lateral leans Lower Body Dressing Details (indicate cue type and reason): Pt performed lower body dressing bed level and sitting EOB with lateral leans. Pt able to respect pain and NWB status with lateral leans while seated. Toilet Transfer: Fish farm manager, Requires drop arm, Minimal assistance Toilet Transfer Details (indicate cue type and reason): Pt transferred to level surface, but terminated attempt that required transfering to elevated surface. Functional mobility during ADLs: Set up General ADL Comments: Supervision for LB ADL long seated in bed.     Mobility   Overal bed mobility: Needs Assistance Bed Mobility: Supine to Sit Supine to sit: Min guard, HOB elevated Sit to supine: Min assist General bed mobility comments: increased time required     Transfers   Overall transfer level: Needs assistance Equipment used: None Transfers: Bed to chair/wheelchair/BSC Sit to Stand: Min assist Bed  to/from chair/wheelchair/BSC transfer type:: Lateral/scoot transfer Anterior-Posterior transfers:  Modified independent (Device/Increase time)  Lateral/Scoot Transfers: Min assist General transfer comment: bed height slightly elevated compared to recliner chair. cues for techniques with emphasis on maintaining NWB of LLE with transfers. patient using mostly BUE with occasional support from RLE in CAM boot to complete transfer.     Ambulation / Gait / Stairs / Psychologist, prison and probation services   Ambulation/Gait General Gait Details: not attempted Naval architect mobility: Yes Wheelchair propulsion: Both upper extremities Wheelchair parts: Needs assistance Distance: 25 (x 3 bouts) Wheelchair Assistance Details (indicate cue type and reason): patient required physical assistance for removing the leg rest and arm rests in preparation for lateral transfers using the transfer board. she needed intermittent physical assistance to unlock the right break. patient was able to propel around tight spaces with moderate cues for technique. she is able to propel a distance of up to 2ft before requiring rest break due to upper body fatigue     Posture / Balance Dynamic Sitting Balance Sitting balance - Comments: no LOB with scooting today or assist needed Balance Overall balance assessment: Needs assistance Sitting-balance support: No upper extremity supported, Feet supported Sitting balance-Leahy Scale: Good Sitting balance - Comments: no LOB with scooting today or assist needed Standing balance support: Bilateral upper extremity supported Standing balance-Leahy Scale: Fair Standing balance comment: Reliant on RW to stand at EOB     Special needs/care consideration Diabetic management yes    Previous Home Environment (from acute therapy documentation) Living Arrangements: Children Available Help at Discharge: Family, Available 24 hours/day Type of Home: House Home Layout: One level Home Access:  Stairs to enter [employer] of Steps: 3 in back, 6 in front Bathroom Shower/Tub: Tub/shower unit Home Care Services: No Additional Comments: Pt recently moved in with her son, daughter-in-law, & their 3 children. Daughter-in-law home throughout the day, son works. Pt was sleeping on air mattress.   Discharge Living Setting Plans for Discharge Living Setting: Lives with (comment) (son/DIL) Type of Home at Discharge: House Discharge Home Layout: One level Discharge Home Access: Stairs to enter Entrance Stairs-Rails: Right, Left Entrance Stairs-Number of Steps: 3 in back, 6 in front Discharge Bathroom Shower/Tub: Tub/shower unit Discharge Bathroom Toilet: Standard Discharge Bathroom Accessibility: No Does the patient have any problems obtaining your medications?: No   Social/Family/Support Systems Anticipated Caregiver: mod I goals, but son Onalee Hua and his wife are available for supervision Anticipated Caregiver's Contact Information: Onalee Hua [phone] Ability/Limitations of Caregiver: Onalee Hua works 2nd shift, DIL only able to provide supervision 2/2 previous CVA Caregiver Availability: Intermittent Discharge Plan Discussed with Primary Caregiver: Yes Is Caregiver In Agreement with Plan?: Yes Does Caregiver/Family have Issues with Lodging/Transportation while Pt is in Rehab?: No   Goals Patient/Family Goal for Rehab: PT/OT mod I w/c level, supervision short distance ambulation with RW; SLP n/a Expected length of stay: 10-12 days Additional Information: Discharge plan: return to son's home at mod I w/c level.  Son works, and DIL is present during the day but only can provide supervision due to previous CVA Pt/Family Agrees to Admission and willing to participate: Yes Program Orientation Provided & Reviewed with Pt/Caregiver Including Roles  & Responsibilities: Yes  Barriers to Discharge: Insurance for SNF coverage, Home environment access/layout   Decrease burden of Care  through IP rehab admission: n/a   Possible need for SNF placement upon discharge: Not anticipated.  Plan for pt to discharge back to her son's home at supervision/mod I level.  He works 2nd shift out of the home.  His spouse  is home during the day but can only provide supervision due to health issues.    Patient Condition: I have reviewed medical records from [facility], spoken with CM, and patient. I discussed via phone for inpatient rehabilitation assessment.  Patient will benefit from ongoing PT and OT, can actively participate in 3 hours of therapy a day 5 days of the week, and can make measurable gains during the admission.  Patient will also benefit from the coordinated team approach during an Inpatient Acute Rehabilitation admission.  The patient will receive intensive therapy as well as Rehabilitation physician, nursing, social worker, and care management interventions.  Due to safety, disease management, medication administration, pain management, and patient education the patient requires 24 hour a day rehabilitation nursing.  The patient is currently min assist with mobility and basic ADLs.  Discharge setting and therapy post discharge at home with home health is anticipated.  Patient has agreed to participate in the Acute Inpatient Rehabilitation Program and will admit today.   Preadmission Screen Completed By:  Stephania Fragmin, PT, DPT 09/23/2022 4:09 PM ______________________________________________________________________   Discussed status with Dr. Shearon Stalls on 09/25/22  at 10:54 AM  and received approval for admission today.   Admission Coordinator:  Stephania Fragmin, PT, DPT time 10:54 AM Dorna Bloom 09/25/22     Assessment/Plan: Diagnosis: Does the need for close, 24 hr/day Medical supervision in concert with the patient's rehab needs make it unreasonable for this patient to be served in a less intensive setting? Yes Co-Morbidities requiring supervision/potential complications:  osteoporosis w/  multiple recent traumatic falls/fractures, poor pain control, hyponatremia, UTI, encephalopathy, DM II with hypoglycemia, CAD s/p MI, and PTSD/Bipolar/schizophrenia/MDD Due to safety, skin/wound care, disease management, medication administration, pain management, and patient education, does the patient require 24 hr/day rehab nursing? Yes Does the patient require coordinated care of a physician, rehab nurse, PT, OT to address physical and functional deficits in the context of the above medical diagnosis(es)? Yes Addressing deficits in the following areas: balance, endurance, locomotion, strength, transferring, bathing, dressing, grooming, toileting, and psychosocial support Can the patient actively participate in an intensive therapy program of at least 3 hrs of therapy 5 days a week? Yes The potential for patient to make measurable gains while on inpatient rehab is good Anticipated functional outcomes upon discharge from inpatient rehab: modified independent PT, modified independent OT,  Estimated rehab length of stay to reach the above functional goals is: 10-12 days Anticipated discharge destination: Home 10. Overall Rehab/Functional Prognosis: good     MD Signature:   Angelina Sheriff, DO 09/25/2022

## 2022-09-25 NOTE — Progress Notes (Addendum)
Inpatient Rehab Admissions Coordinator:    I have insurance approval and a bed available for pt to admit to CIR today. Dr. Renae Gloss in agreement and CM aware.  Will let pt know.  I will call for [facility] pickup once I have a room assignment.   1117: Pt is going to room 4w12 at [facility].  RN station is [phone].  Pickup with [facility] scheduled at 1pm.    Estill Dooms, PT, DPT Admissions Coordinator [phone] 09/25/22  10:52 AM

## 2022-09-25 NOTE — Plan of Care (Signed)
  Problem: Consults Goal: RH GENERAL PATIENT EDUCATION Description: See Patient Education module for education specifics. Outcome: Progressing   Problem: RH BOWEL ELIMINATION Goal: RH STG MANAGE BOWEL WITH ASSISTANCE Description: STG Manage Bowel with mod I Assistance. Outcome: Progressing Goal: RH STG MANAGE BOWEL W/MEDICATION W/ASSISTANCE Description: STG Manage Bowel with Medication with mod I Assistance. Outcome: Progressing   Problem: RH SKIN INTEGRITY Goal: RH STG SKIN FREE OF INFECTION/BREAKDOWN Description: Manage with cues Outcome: Progressing   Problem: RH SAFETY Goal: RH STG ADHERE TO SAFETY PRECAUTIONS W/ASSISTANCE/DEVICE Description: STG Adhere to Safety Precautions With cues Assistance/Device. Outcome: Progressing   Problem: RH PAIN MANAGEMENT Goal: RH STG PAIN MANAGED AT OR BELOW PT'S PAIN GOAL Description: < 4 with prns Outcome: Progressing   Problem: RH KNOWLEDGE DEFICIT GENERAL Goal: RH STG INCREASE KNOWLEDGE OF SELF CARE AFTER HOSPITALIZATION Description: Patient will be able to manage care and direct others to assist using educational resources independently Outcome: Progressing

## 2022-09-25 NOTE — Assessment & Plan Note (Addendum)
Last hemoglobin 9.7 and ferritin 21.  Start iron supplement.  Check hemoglobin every week while on Lovenox injections.

## 2022-09-25 NOTE — Assessment & Plan Note (Signed)
BMI 36.05

## 2022-09-25 NOTE — Discharge Summary (Signed)
Physician Discharge Summary   Patient: Diana Quinn MRN: [id number] DOB: 09-04-1965  Admit date:     09/09/2022  Discharge date: 09/25/22  Discharge Physician: Alford Highland   PCP: Orson Eva, NP   Recommendations at discharge:   Follow-up with team at acute inpatient rehab Follow-up podiatry Dr. Nicholes Rough 2 weeks Follow-up with medical doctor once out of acute inpatient rehab  Discharge Diagnoses: Principal Problem:   Ankle fracture Active Problems:   Closed fracture of left distal tibia   Major depressive disorder, recurrent episode, moderate (HCC)   Hyponatremia   E. coli UTI   Acute metabolic encephalopathy   Hypothyroidism   Iron deficiency anemia   History of seizure   Obesity (BMI 30-39.9)   Hospital Course: 57 y.o. female with medical history significant of  PTSD/Bipolar/Schizophrenia, anxiety,Osteoporosis, DMII with history of hypogylcemia, CAD s/p MI with non-obstructive CAD on Cath per patient, Seizure d/o djd for the back , TIA , hx of gastric bypass for treatment of obesity.  She is admitted for nondisplaced medial malleolus and distal lateral calcaneus fracture s/p mechanical fall   Patient is seen by podiatry, not a surgical candidate.  But deemed to be nonweightbearing for bilateral legs.  Received authorization for acute inpatient rehab on 09/25/2022.  Of note patient does take chronic pain medications at home usually around twice a day.  Will try to use Tylenol instead.  Prescribed 10 mg of oxycodone as needed.  Will give Lovenox injections until able to weight-bear to prevent DVT.  Assessment and Plan: * Ankle fracture CT showing a nondisplaced fracture through the right medial malleolus and distal lateral calcaneus.  Patient in splint.  Follow-up with podiatry in 2 weeks.  Closed fracture of left distal tibia Patient in a cast.  Follow-up podiatry in 2 weeks.  Major depressive disorder, recurrent episode, moderate (HCC) Currently on Paxil,  Seroquel, does not rule and Trileptal and Calypta  Hyponatremia Sodium now normal at 135  E. coli UTI Treated  Acute metabolic encephalopathy Improved with treatment of UTI.  Obesity (BMI 30-39.9) BMI 36.05  History of seizure On Trileptal  Iron deficiency anemia Last hemoglobin 9.7 and ferritin 21.  Start iron supplement.  Check hemoglobin every week while on Lovenox injections.  Hypothyroidism On levothyroxine         Consultants: Podiatry Procedures performed: Left leg, brace right leg Disposition: Acute inpatient rehab Diet recommendation:  Regular DISCHARGE MEDICATION: Allergies as of 09/25/2022       Reactions   Depakote [divalproex Sodium] Shortness Of Breath   Pregabalin Other (See Comments), Rash, Shortness Of Breath   Other Reaction: falls; decreased coordination Pt ts that she passes out when she takes this med.  Other Reaction: falls; decreased coordination    Pt ts that she passes out when she takes this med. Other Reaction: falls; decreased coordination, , Pt ts that she passes out when she takes this med.   Valproic Acid Other (See Comments), Shortness Of Breath   Other Reaction: Other reaction SOB   Levetiracetam Itching, Rash        Medication List     STOP taking these medications    Belsomra 20 MG Tabs Generic drug: Suvorexant   clonazePAM 1 MG tablet Commonly known as: KLONOPIN   diclofenac Sodium 1 % Gel Commonly known as: Voltaren   Gvoke HypoPen 2-Pack 1 MG/0.2ML Soaj Generic drug: Glucagon   oxyCODONE-acetaminophen 7.5-325 MG tablet Commonly known as: PERCOCET   propranolol 20 MG tablet Commonly known as:  INDERAL       TAKE these medications    acetaminophen 325 MG tablet Commonly known as: TYLENOL Take 2 tablets (650 mg total) by mouth every 6 (six) hours as needed for mild pain or moderate pain.   atorvastatin 40 MG tablet Commonly known as: LIPITOR Take 1 tablet (40 mg total) by mouth daily.   Caplyta  42 MG capsule Generic drug: lumateperone tosylate Take 42 mg by mouth daily.   cyanocobalamin 1000 MCG/ML injection Commonly known as: VITAMIN B12 Inject 1 mL (1,000 mcg total) into the muscle every 30 (thirty) days.   enoxaparin 40 MG/0.4ML injection Commonly known as: LOVENOX Inject 0.4 mLs (40 mg total) into the skin daily.   ergocalciferol 1.25 MG (50000 UT) capsule Commonly known as: VITAMIN D2 Take 1.25 mcg by mouth once a week.   ferrous sulfate 325 (65 FE) MG tablet Take 1 tablet (325 mg total) by mouth daily with breakfast. Start taking on: September 26, 2022   levothyroxine 112 MCG tablet Commonly known as: SYNTHROID Take 112 mcg by mouth daily before breakfast.   melatonin 5 MG Tabs Take 1 tablet (5 mg total) by mouth at bedtime as needed.   multivitamin with minerals Tabs tablet Take 1 tablet by mouth daily. Start taking on: September 26, 2022   naloxone 4 MG/0.1ML Liqd nasal spray kit Commonly known as: NARCAN as needed. As needed   oxcarbazepine 600 MG tablet Commonly known as: TRILEPTAL Take 1 tablet (600 mg total) by mouth 2 (two) times daily. Pt states she only takes 600mg  twice a day per neurologist   Oxycodone HCl 10 MG Tabs Take 1 tablet (10 mg total) by mouth every 6 (six) hours as needed for severe pain.   pantoprazole 40 MG tablet Commonly known as: PROTONIX Take 40 mg by mouth daily.   PARoxetine 40 MG tablet Commonly known as: PAXIL Take 40 mg by mouth daily.   prazosin 2 MG capsule Commonly known as: MINIPRESS Take 2-4 mg by mouth at bedtime.   QUEtiapine 100 MG tablet Commonly known as: SEROQUEL Take 3 tablets (300 mg total) by mouth at bedtime.   ranolazine 1000 MG SR tablet Commonly known as: RANEXA Take 1,000 mg by mouth 2 (two) times daily.   thiamine 100 MG tablet Commonly known as: Vitamin B-1 Take 1 tablet (100 mg total) by mouth daily. Start taking on: September 26, 2022   tiZANidine 4 MG tablet Commonly known as: ZANAFLEX Take  1 tablet (4 mg total) by mouth 3 (three) times daily.   trazodone 300 MG tablet Commonly known as: DESYREL Take 300 mg by mouth at bedtime.        Follow-up Information     Orson Eva, NP. Schedule an appointment as soon as possible for a visit in 1 week(s).   Specialty: Nurse Practitioner Why: after discharge from acute rehab Contact information: [address] [place] [address] [phone]         Candelaria Stagers, DPM. Schedule an appointment as soon as possible for a visit in 2 week(s).   Specialty: Podiatry Contact information: [address] [place] [address] [phone]                Discharge Exam: Filed Weights   09/22/22 0500 09/23/22 0429 09/24/22 0448  Weight: 92 kg 92.1 kg 92.3 kg   Physical Exam HENT:     Head: Normocephalic.     Mouth/Throat:     Pharynx: No oropharyngeal exudate.  Eyes:  General: Lids are normal.     Conjunctiva/sclera: Conjunctivae normal.  Cardiovascular:     Rate and Rhythm: Normal rate and regular rhythm.     Heart sounds: Normal heart sounds, S1 normal and S2 normal.  Pulmonary:     Breath sounds: No decreased breath sounds, wheezing, rhonchi or rales.  Abdominal:     Palpations: Abdomen is soft.     Tenderness: There is no abdominal tenderness.  Musculoskeletal:     Right lower leg: No swelling.     Left lower leg: No swelling.  Skin:    General: Skin is warm.     Findings: No rash.  Neurological:     Mental Status: She is alert and oriented to person, place, and time.     Comments: Able to wiggle toes bilaterally.      Condition at discharge: stable  The results of significant diagnostics from this hospitalization (including imaging, microbiology, ancillary and laboratory) are listed below for reference.   Imaging Studies: DG Ankle Complete Left  Result Date: 09/15/2022 CLINICAL DATA:  Ankle fracture and pain. EXAM: LEFT ANKLE COMPLETE - 3+ VIEW COMPARISON:  Left ankle radiograph  dated 09/09/2022. FINDINGS: Nondisplaced oblique fracture of the distal tibia. No new fracture. The ankle mortise is intact. Osteophyte along the anterior talus. Overlying cast noted. IMPRESSION: Nondisplaced oblique fracture of the distal tibia status post cast placement. Electronically Signed   By: Elgie Collard M.D.   On: 09/15/2022 15:39   ECHOCARDIOGRAM COMPLETE  Result Date: 09/12/2022    ECHOCARDIOGRAM REPORT   Patient Name:   KAMRON PORTEE Date of Exam: 09/12/2022 Medical Rec #:  [id number]     Height:       63.0 in Accession #:    [id number]    Weight:       181.9 lb Date of Birth:  1965-12-23     BSA:          1.857 m Patient Age:    56 years      BP:           134/96 mmHg Patient Gender: F             HR:           79 bpm. Exam Location:  [facility] Procedure: 2D Echo, Cardiac Doppler and Color Doppler Indications:     Dyspnea R06.00  History:         Patient has no prior history of Echocardiogram examinations.                  Previous Myocardial Infarction, TIA; Risk Factors:Hypertension.  Sonographer:     Cristela Blue Referring Phys:  [id number] VIPUL [facility] Diagnosing Phys: Julien Nordmann MD  Sonographer Comments: Technically challenging study due to limited acoustic windows and no apical window. Image acquisition challenging due to patient behavioral factors. IMPRESSIONS  1. Left ventricular ejection fraction, by estimation, is 60 to 65%. The left ventricle has normal function. The left ventricle has no regional wall motion abnormalities. Left ventricular diastolic parameters are indeterminate.  2. Right ventricular systolic function is normal. The right ventricular size is normal. Tricuspid regurgitation signal is inadequate for assessing PA pressure.  3. The mitral valve is normal in structure. No evidence of mitral valve regurgitation. No evidence of mitral stenosis.  4. The aortic valve is normal in structure. Aortic valve regurgitation is not visualized. No aortic stenosis is present.  5. The inferior vena  cava is normal in size with greater than  50% respiratory variability, suggesting right atrial pressure of 3 mmHg. FINDINGS  Left Ventricle: Left ventricular ejection fraction, by estimation, is 60 to 65%. The left ventricle has normal function. The left ventricle has no regional wall motion abnormalities. The left ventricular internal cavity size was normal in size. There is  no left ventricular hypertrophy. Left ventricular diastolic parameters are indeterminate. Right Ventricle: The right ventricular size is normal. No increase in right ventricular wall thickness. Right ventricular systolic function is normal. Tricuspid regurgitation signal is inadequate for assessing PA pressure. Left Atrium: Left atrial size was normal in size. Right Atrium: Right atrial size was normal in size. Pericardium: There is no evidence of pericardial effusion. Mitral Valve: The mitral valve is normal in structure. No evidence of mitral valve regurgitation. No evidence of mitral valve stenosis. Tricuspid Valve: The tricuspid valve is normal in structure. Tricuspid valve regurgitation is not demonstrated. No evidence of tricuspid stenosis. Aortic Valve: The aortic valve is normal in structure. Aortic valve regurgitation is not visualized. No aortic stenosis is present. Pulmonic Valve: The pulmonic valve was normal in structure. Pulmonic valve regurgitation is not visualized. No evidence of pulmonic stenosis. Aorta: The aortic root is normal in size and structure. Venous: The inferior vena cava is normal in size with greater than 50% respiratory variability, suggesting right atrial pressure of 3 mmHg. IAS/Shunts: No atrial level shunt detected by color flow Doppler.  LEFT VENTRICLE PLAX 2D LVIDd:         4.90 cm LVIDs:         3.50 cm LV PW:         1.10 cm LV IVS:        0.90 cm LVOT diam:     2.10 cm LVOT Area:     3.46 cm  LEFT ATRIUM         Index LA diam:    2.80 cm 1.51 cm/m   AORTA Ao Root diam: 2.80 cm TRICUSPID VALVE TR Peak  grad:   10.5 mmHg TR Vmax:        162.00 cm/s  SHUNTS Systemic Diam: 2.10 cm Julien Nordmann MD Electronically signed by Julien Nordmann MD Signature Date/Time: 09/12/2022/12:26:20 PM    Final    CT Angio Chest Pulmonary Embolism (PE) W or WO Contrast  Result Date: 09/12/2022 CLINICAL DATA:  Leg fracture.  Concern for pulmonary embolism. EXAM: CT ANGIOGRAPHY CHEST WITH CONTRAST TECHNIQUE: Multidetector CT imaging of the chest was performed using the standard protocol during bolus administration of intravenous contrast. Multiplanar CT image reconstructions and MIPs were obtained to evaluate the vascular anatomy. RADIATION DOSE REDUCTION: This exam was performed according to the departmental dose-optimization program which includes automated exposure control, adjustment of the mA and/or kV according to patient size and/or use of iterative reconstruction technique. CONTRAST:  75mL OMNIPAQUE IOHEXOL 350 MG/ML SOLN COMPARISON:  None Available. FINDINGS: Cardiovascular: No filling defects within the pulmonary arteries to suggest acute pulmonary embolism. Mediastinum/Nodes: No axillary or supraclavicular adenopathy. No mediastinal or hilar adenopathy. No pericardial fluid. Esophagus normal. Lungs/Pleura: No pulmonary infarction. No pneumonia. No pleural fluid. No pneumothorax Upper Abdomen: Surgical staples in the gastric cardia. No acute findings Musculoskeletal: With chronic lumbar compression fracture. No acute findings Review of the MIP images confirms the above findings. IMPRESSION: 1. No evidence acute pulmonary embolism. 2. No acute pulmonary findings. 3. Chronic lumbar compression fracture. Electronically Signed   By: Genevive Bi M.D.   On: 09/12/2022 10:53   DG Chest [facility]  Result Date: 09/12/2022 CLINICAL DATA:  Dyspnea. EXAM: PORTABLE CHEST 1 VIEW COMPARISON:  12/25/2021 FINDINGS: Cardiac silhouette is normal in size. No mediastinal or hilar masses. Clear lungs.  No pleural effusion or pneumothorax.  Skeletal structures are grossly intact. IMPRESSION: No active disease. Electronically Signed   By: Amie Portland M.D.   On: 09/12/2022 10:08   DG Abd 1 View  Result Date: 09/12/2022 CLINICAL DATA:  Abdominal pain, vomiting EXAM: ABDOMEN - 1 VIEW COMPARISON:  CT abdomen/pelvis dated 09/24/2018 FINDINGS: Nonobstructive bowel gas pattern. Scattered surgical clips throughout the abdomen/pelvis, with a gastric suture line in the left upper quadrant. Visualized osseous structures are within normal limits. Lung bases are clear. IMPRESSION: Nonobstructive bowel gas pattern. Electronically Signed   By: Charline Bills M.D.   On: 09/12/2022 01:41   CT Ankle Left Wo Contrast  Result Date: 09/10/2022 CLINICAL DATA:  Ankle trauma, fracture, xray done (Age >= 5y) EXAM: CT OF THE LEFT ANKLE WITHOUT CONTRAST TECHNIQUE: Multidetector CT imaging of the left ankle was performed according to the standard protocol. Multiplanar CT image reconstructions were also generated. RADIATION DOSE REDUCTION: This exam was performed according to the departmental dose-optimization program which includes automated exposure control, adjustment of the mA and/or kV according to patient size and/or use of iterative reconstruction technique. COMPARISON:  Plain films today FINDINGS: Bones/Joint/Cartilage There is an oblique fracture through the distal tibial metaphysis. No visible fibular fracture. Ankle mortise appears intact. Fracture through the anterior process of the calcaneus. Well corticated bone fragments noted along the anterior surface of the distal talus. Ligaments Suboptimally assessed by CT. Muscles and Tendons Grossly unremarkable Soft tissues Soft tissue swelling in the lower calf and ankle region. IMPRESSION: Oblique fracture through the distal tibial metaphysis. Minimally displaced fracture through the anterior process of the calcaneus. Electronically Signed   By: Charlett Nose M.D.   On: 09/10/2022 00:15   CT Ankle Right Wo  Contrast  Result Date: 09/10/2022 CLINICAL DATA:  Ankle pain, stress fracture suspected, neg xray. Fall. EXAM: CT OF THE RIGHT ANKLE WITHOUT CONTRAST TECHNIQUE: Multidetector CT imaging of the right ankle was performed according to the standard protocol. Multiplanar CT image reconstructions were also generated. RADIATION DOSE REDUCTION: This exam was performed according to the departmental dose-optimization program which includes automated exposure control, adjustment of the mA and/or kV according to patient size and/or use of iterative reconstruction technique. COMPARISON:  None Available. FINDINGS: Bones/Joint/Cartilage There is an oblique nondisplaced fracture through the right medial malleolus. Multiple small bone fragments adjacent to the tip of the lateral malleolus and lateral talus, many of which appear well corticated and chronic, but others are likely acute. Widening of the lateral ankle mortise. Nondisplaced fracture noted through the distal calcaneus laterally near the calcaneocuboid joint. Ligaments Suboptimally assessed by CT. Muscles and Tendons Grossly unremarkable. Soft tissues Soft tissue swelling diffusely, most pronounced laterally. IMPRESSION: Nondisplaced fracture through the medial malleolus and distal lateral calcaneus. Probable avulsed fragments off the lateral malleolus and/or lateral talus. Widening of the lateral ankle mortise. Electronically Signed   By: Charlett Nose M.D.   On: 09/10/2022 00:12   DG Ankle Complete Right  Result Date: 09/09/2022 CLINICAL DATA:  Mechanical fall.  Ankle pain and deformity. EXAM: RIGHT ANKLE - COMPLETE 3+ VIEW COMPARISON:  None Available. FINDINGS: There is no evidence of fracture, dislocation, or joint effusion. Small osseous fragment about the inferior aspect of the lateral malleolus, likely a chronic process. Osteophytes about the talonavicular joint suggesting arthropathy. Prominent plantar calcaneal  spurring. Marked soft tissue swelling about  lateral malleolus. IMPRESSION: Marked soft tissue swelling about the lateral malleolus without evidence of acute fracture or dislocation. Degenerative changes of the talonavicular joint. Electronically Signed   By: Larose Hires D.O.   On: 09/09/2022 22:21   DG Ankle Complete Left  Result Date: 09/09/2022 CLINICAL DATA:  Left ankle pain/deformity. EXAM: LEFT ANKLE COMPLETE - 3+ VIEW COMPARISON:  None Available. FINDINGS: There is oblique mildly displaced fracture of the distal tibia. No other appreciable fracture or dislocation. Osteophyte about the anterior aspect of the talus concerning for anterior ankle impingement. Marked soft tissue swelling about anteromedial aspect of the distal leg. IMPRESSION: 1. Oblique mildly displaced fracture of the distal tibia. 2. Osteophyte about the anterior aspect of the talus concerning for anterior ankle impingement. 3. Marked soft tissue swelling about the anteromedial aspect of the distal leg. Electronically Signed   By: Larose Hires D.O.   On: 09/09/2022 22:17    Microbiology: Results for orders placed or performed during the hospital encounter of 09/09/22  Urine Culture     Status: Abnormal   Collection Time: 09/15/22  3:39 AM   Specimen: Urine, Clean Catch  Result Value Ref Range Status   Specimen Description   Final    URINE, CLEAN CATCH Performed at [facility], [address], [place], [place] [address]    Special Requests   Final    NONE Performed at [facility], [address] [place], [place] [address]    Culture >=100,000 COLONIES/mL ESCHERICHIA COLI (A)  Final   Report Status 09/18/2022 FINAL  Final   Organism ID, Bacteria ESCHERICHIA COLI (A)  Final      Susceptibility   Escherichia coli - MIC*    AMPICILLIN >=32 RESISTANT Resistant     CEFAZOLIN >=64 RESISTANT Resistant     CEFEPIME <=0.12 SENSITIVE Sensitive     CEFTRIAXONE 8 RESISTANT Resistant     CIPROFLOXACIN <=0.25 SENSITIVE Sensitive     GENTAMICIN <=1  SENSITIVE Sensitive     IMIPENEM <=0.25 SENSITIVE Sensitive     NITROFURANTOIN <=16 SENSITIVE Sensitive     TRIMETH/SULFA <=20 SENSITIVE Sensitive     AMPICILLIN/SULBACTAM >=32 RESISTANT Resistant     PIP/TAZO >=128 RESISTANT Resistant     * >=100,000 COLONIES/mL ESCHERICHIA COLI    Labs: CBC: Recent Labs  Lab 09/25/22 0330  WBC 8.1  HGB 9.7*  HCT 29.5*  MCV 97.4  PLT 552*   Basic Metabolic Panel: Recent Labs  Lab 09/21/22 0418 09/25/22 0330  NA 131* 135  K 4.2 3.6  CL 99 103  CO2 27 23  GLUCOSE 82 138*  BUN 14 13  CREATININE 0.64 0.63  CALCIUM 8.2* 8.1*   Liver Function Tests: No results for input(s): "AST", "ALT", "ALKPHOS", "BILITOT", "PROT", "ALBUMIN" in the last 168 hours. CBG: Recent Labs  Lab 09/24/22 0722 09/24/22 1126 09/24/22 1623 09/24/22 2119 09/25/22 0749  GLUCAP 88 126* 89 76 83    Discharge time spent: greater than 30 minutes.  Signed: Alford Highland, MD [facility] 09/25/2022

## 2022-09-25 NOTE — Progress Notes (Signed)
Inpatient Rehabilitation Admission Medication Review by a Pharmacist  A complete drug regimen review was completed for this patient to identify any potential clinically significant medication issues.  High Risk Drug Classes Is patient taking? Indication by Medication  Antipsychotic Yes Seroquel - mood  Anticoagulant Yes Lovenox - VTE ppx  Antibiotic No   Opioid Yes Oxycodone prn pain  Antiplatelet No   Hypoglycemics/insulin Yes Insulin - SSI  Vasoactive Medication Yes Ranolazine - CAD Prazosin - mood  Chemotherapy No   Other Yes Trazodone - sleep Atorvastatin - HLD Tizanidine - muscle spasms Albuterol nebs - prn sob Zofran - prn N/V Thiamine - supplement Hydroxyzine prn anxiety Ibuprofen - prn pain Levothyroxine - low thyroid  Capylta, Trileptal, paroxetine - mood  Pantoprazole - reflux     Type of Medication Issue Identified Description of Issue Recommendation(s)  Drug Interaction(s) (clinically significant)     Duplicate Therapy     Allergy     No Medication Administration End Date     Incorrect Dose     Additional Drug Therapy Needed     Significant med changes from prior encounter (inform family/care partners about these prior to discharge).    Other       Clinically significant medication issues were identified that warrant physician communication and completion of prescribed/recommended actions by midnight of the next day:  No  Name of provider notified for urgent issues identified:   Provider Method of Notification:     Pharmacist comments:   Time spent performing this drug regimen review (minutes): 20 minutes Okey Regal, PharmD

## 2022-09-26 ENCOUNTER — Inpatient Hospital Stay (HOSPITAL_COMMUNITY): Payer: MEDICAID

## 2022-09-26 DIAGNOSIS — D75839 Thrombocytosis, unspecified: Secondary | ICD-10-CM

## 2022-09-26 DIAGNOSIS — M25579 Pain in unspecified ankle and joints of unspecified foot: Secondary | ICD-10-CM

## 2022-09-26 DIAGNOSIS — M7989 Other specified soft tissue disorders: Secondary | ICD-10-CM

## 2022-09-26 DIAGNOSIS — G4700 Insomnia, unspecified: Secondary | ICD-10-CM

## 2022-09-26 DIAGNOSIS — D638 Anemia in other chronic diseases classified elsewhere: Secondary | ICD-10-CM

## 2022-09-26 LAB — CBC WITH DIFFERENTIAL/PLATELET
Abs Immature Granulocytes: 0.03 10*3/uL (ref 0.00–0.07)
Basophils Absolute: 0.1 10*3/uL (ref 0.0–0.1)
Basophils Relative: 1 %
Eosinophils Absolute: 0.3 10*3/uL (ref 0.0–0.5)
Eosinophils Relative: 5 %
HCT: 34.9 % — ABNORMAL LOW (ref 36.0–46.0)
Hemoglobin: 11.5 g/dL — ABNORMAL LOW (ref 12.0–15.0)
Immature Granulocytes: 0 %
Lymphocytes Relative: 32 %
Lymphs Abs: 2.3 10*3/uL (ref 0.7–4.0)
MCH: 33.1 pg (ref 26.0–34.0)
MCHC: 33 g/dL (ref 30.0–36.0)
MCV: 100.6 fL — ABNORMAL HIGH (ref 80.0–100.0)
Monocytes Absolute: 0.8 10*3/uL (ref 0.1–1.0)
Monocytes Relative: 11 %
Neutro Abs: 3.8 10*3/uL (ref 1.7–7.7)
Neutrophils Relative %: 51 %
Platelets: 587 10*3/uL — ABNORMAL HIGH (ref 150–400)
RBC: 3.47 MIL/uL — ABNORMAL LOW (ref 3.87–5.11)
RDW: 14.3 % (ref 11.5–15.5)
WBC: 7.4 10*3/uL (ref 4.0–10.5)
nRBC: 0 % (ref 0.0–0.2)

## 2022-09-26 LAB — COMPREHENSIVE METABOLIC PANEL
ALT: 17 U/L (ref 0–44)
AST: 25 U/L (ref 15–41)
Albumin: 3 g/dL — ABNORMAL LOW (ref 3.5–5.0)
Alkaline Phosphatase: 171 U/L — ABNORMAL HIGH (ref 38–126)
Anion gap: 9 (ref 5–15)
BUN: 11 mg/dL (ref 6–20)
CO2: 23 mmol/L (ref 22–32)
Calcium: 8.5 mg/dL — ABNORMAL LOW (ref 8.9–10.3)
Chloride: 101 mmol/L (ref 98–111)
Creatinine, Ser: 0.71 mg/dL (ref 0.44–1.00)
GFR, Estimated: >60 mL/min (ref >60)
Glucose, Bld: 92 mg/dL (ref 70–99)
Potassium: 4.5 mmol/L (ref 3.5–5.1)
Sodium: 133 mmol/L — ABNORMAL LOW (ref 135–145)
Total Bilirubin: 0.3 mg/dL (ref 0.3–1.2)
Total Protein: 6 g/dL — ABNORMAL LOW (ref 6.5–8.1)

## 2022-09-26 LAB — GLUCOSE, CAPILLARY
Glucose-Capillary: 83 mg/dL (ref 70–99)
Glucose-Capillary: 103 mg/dL — ABNORMAL HIGH (ref 70–99)
Glucose-Capillary: 174 mg/dL — ABNORMAL HIGH (ref 70–99)
Glucose-Capillary: 92 mg/dL (ref 70–99)
Glucose-Capillary: 180 mg/dL — ABNORMAL HIGH (ref 70–99)

## 2022-09-26 NOTE — Progress Notes (Signed)
Bilateral lower extremity venous duplex has been completed. Preliminary results can be found in CV Proc through chart review.   09/26/22 1:07 PM Olen Cordial RVT

## 2022-09-26 NOTE — Evaluation (Signed)
Occupational Therapy Assessment and Plan  Patient Details  Name: Diana Quinn MRN: [id number] Date of Birth: 02-Mar-1966  OT Diagnosis: abnormal posture, acute pain, muscle weakness (generalized), and pain in joint Rehab Potential: Rehab Potential (ACUTE ONLY): Good ELOS: 10-12 days   Today's Date: 09/26/2022 OT Individual Time: 0750-0900 OT Individual Time Calculation (min): 70 min     Hospital Problem: Principal Problem:   Bilateral ankle fractures   Past Medical History:  Past Medical History:  Diagnosis Date   Anemia    Elevated lithium level 05/10/2014   Gastric bypass status for obesity    H/O unstable angina    Hx SBO 09/30/2014   Hypoglycemia    MI (myocardial infarction) (HCC)    Orthostatic hypotension    Osteoporosis    Sciatic nerve disease    Seizures (HCC)    Thyroid disease    TIA (transient ischemic attack) 04/22/2017   Past Surgical History:  Past Surgical History:  Procedure Laterality Date   ABDOMINAL ADHESION SURGERY     ABDOMINAL HYSTERECTOMY     CHOLECYSTECTOMY     DILATION AND CURETTAGE OF UTERUS     ESOPHAGOGASTRODUODENOSCOPY     GASTRIC BYPASS     GASTRIC BYPASS OPEN     revision   GASTROSTOMY W/ FEEDING TUBE     HERNIA REPAIR     OOPHORECTOMY     SPLENECTOMY     TONSILLECTOMY      Assessment & Plan Clinical Impression:   Patient currently requires mod with basic self-care skills and IADL secondary to muscle weakness and muscle joint tightness, decreased cardiorespiratoy endurance, unbalanced muscle activation, and decreased sitting balance, decreased standing balance, decreased postural control, decreased balance strategies, and difficulty maintaining precautions.  Prior to hospitalization, patient could complete BADL's and IADL's with independent  but frequent falls and on disability.   Patient will benefit from skilled intervention to decrease level of assist with basic self-care skills, increase independence with basic self-care  skills, and increase level of independence with iADL prior to discharge home with care partner.  Anticipate patient will require intermittent supervision and minimal physical assistance and no further OT follow recommended.  OT - End of Session Activity Tolerance: Decreased this session Endurance Deficit: Yes Endurance Deficit Description: 2/2 carpal tunnel and endurance for WC propulsion OT Assessment Rehab Potential (ACUTE ONLY): Good OT Barriers to Discharge: Inaccessible home environment;Decreased caregiver support;Home environment access/layout;Weight bearing restrictions OT Barriers to Discharge Comments: WB restrictions, steps to enter home, limited cg avail as son works 2nd shift and dIL just recently had a CVA OT Patient demonstrates impairments in the following area(s): Balance;Endurance;Motor;Sensory;Pain OT Basic ADL's Functional Problem(s): Grooming;Bathing;Dressing;Toileting OT Advanced ADL's Functional Problem(s): Simple Meal Preparation;Light Housekeeping;Laundry OT Transfers Functional Problem(s): Toilet;Tub/Shower OT Additional Impairment(s): Fuctional Use of Upper Extremity OT Plan OT Intensity: Minimum of 1-2 x/day, 45 to 90 minutes OT Frequency: 5 out of 7 days OT Duration/Estimated Length of Stay: 10-12 days OT Treatment/Interventions: Balance/vestibular training;Cognitive remediation/compensation;Community reintegration;Discharge planning;Disease mangement/prevention;DME/adaptive equipment instruction;Functional electrical stimulation;Functional mobility training;Neuromuscular re-education;Pain management;Patient/family education;Psychosocial support;Self Care/advanced ADL retraining;Skin care/wound managment;Splinting/orthotics;Therapeutic Activities;Therapeutic Exercise;UE/LE Strength taining/ROM;UE/LE Coordination activities;Visual/perceptual remediation/compensation;Wheelchair propulsion/positioning OT Self Feeding Anticipated Outcome(s): indep OT Basic Self-Care  Anticipated Outcome(s): mod I UB, Supervision LB with DME OT Toileting Anticipated Outcome(s): Supervision OT Bathroom Transfers Anticipated Outcome(s): S with TTB and DABSC OT Recommendation Recommendations for Other Services: Neuropsych consult;Therapeutic Recreation consult Therapeutic Recreation Interventions: Stress management;Kitchen group;Outing/community reintergration Patient destination: Home Follow Up Recommendations: 24 hour supervision/assistance Equipment Recommended: Sliding board;Rolling walker  with 5" wheels;Tub/shower bench;Wheelchair (measurements);Wheelchair cushion (measurements);3 in 1 bedside comode Equipment Details: DABSC, w/c, cushion, TB, RW and may need hospital bed   OT Evaluation Precautions/Restrictions  Precautions Precautions: Fall Restrictions Weight Bearing Restrictions: Yes RLE Weight Bearing: Partial weight bearing RLE Partial Weight Bearing Percentage or Pounds: minimal with transfers only LLE Weight Bearing: Non weight bearing Pain Pain Assessment Pain Scale: 0-10 Pain Score: 5  Pain Type: Acute pain Pain Location: Leg Pain Orientation: Right Pain Descriptors / Indicators: Aching Pain Frequency: Intermittent Pain Onset: Progressive Patients Stated Pain Goal: 2 Pain Intervention(s): Medication (See eMAR) Multiple Pain Sites: No Home Living/Prior Functioning Home Living Family/patient expects to be discharged to:: Private residence Living Arrangements: Children, Other relatives Available Help at Discharge: Family, Available 24 hours/day Type of Home: House Home Access: Stairs to enter [employer] of Steps: 7 steps at [employer] Entrance Stairs-Rails: Right, Left Home Layout: One level Bathroom Shower/Tub: Engineer, manufacturing systems: Standard Additional Comments: unsure if WC can go into bathroom as it has narrow dorrway, pt given home measurement sheet  Lives With: Son IADL History Homemaking Responsibilities:  Yes Education: EMT school Occupation: On disability Type of Occupation: EMT, Water quality scientist Leisure and Hobbies: family Prior Function Level of Independence: Independent with gait, Independent with basic ADLs, Independent with homemaking with ambulation  Able to Take Stairs?: Yes Vision Baseline Vision/History: 1 Wears glasses Ability to See in Adequate Light: 0 Adequate Perception  Perception: Within Functional Limits Praxis Praxis: Intact Cognition Cognition Overall Cognitive Status: Within Functional Limits for tasks assessed Arousal/Alertness: Awake/alert Memory: Appears intact Awareness: Appears intact Problem Solving: Appears intact Safety/Judgment: Impaired Brief Interview for Mental Status (BIMS) Repetition of Three Words (First Attempt): 3 Temporal Orientation: Year: Correct Temporal Orientation: Month: Accurate within 5 days Temporal Orientation: Day: Correct Recall: "Sock": Yes, no cue required Recall: "Blue": Yes, no cue required Recall: "Bed": Yes, no cue required BIMS Summary Score: 15 Sensation Sensation Light Touch: Not tested Hot/Cold: Appears Intact Proprioception: Appears Intact Stereognosis: Appears Intact Additional Comments: pt reports feeling some dull sensation in BLE Coordination Gross Motor Movements are Fluid and Coordinated: Not tested (2/2 cast on BLE) Fine Motor Movements are Fluid and Coordinated: Yes Finger Nose Finger Test: WFL's 9 Hole Peg Test: TBA Motor  Motor Motor: Other (comment) Motor - Skilled Clinical Observations: R hip fl fluid and controlled, L hip fl decreased 2/2 pain in knee cap with activity, other mobility not tested 2/2 casting on BLE  Trunk/Postural Assessment  Cervical Assessment Cervical Assessment: Within Functional Limits Thoracic Assessment Thoracic Assessment: Within Functional Limits Lumbar Assessment Lumbar Assessment: Within Functional Limits Postural Control Postural Control: Within Functional Limits   Balance Balance Balance Assessed: Yes Dynamic Sitting Balance Dynamic Sitting - Balance Support: No upper extremity supported;Feet unsupported Dynamic Sitting - Level of Assistance: 5: Stand by assistance Dynamic Sitting - Balance Activities: Forward lean/weight shifting;Lateral lean/weight shifting Sitting balance - Comments: no LOB with scooting today or assist needed Extremity/Trunk Assessment RUE Assessment RUE Assessment: Exceptions to [facility] General Strength Comments: grossly 4-/5 LUE Assessment LUE Assessment: Exceptions to [facility] General Strength Comments: grossly 4-/5  Care Tool Care Tool Self Care Eating   Eating Assist Level: Set up assist    Oral Care    Oral Care Assist Level: Set up assist    Bathing   Body parts bathed by patient: Right arm;Left arm;Chest;Abdomen;Front perineal area;Buttocks;Right upper leg;Left upper leg;Face Body parts bathed by helper: Right lower leg Body parts n/a: Left lower leg Assist Level: Moderate  Assistance - Patient 50 - 74%    Upper Body Dressing(including orthotics)   What is the patient wearing?: Pull over shirt   Assist Level: Set up assist    Lower Body Dressing (excluding footwear)   What is the patient wearing?: Pants Assist for lower body dressing: Moderate Assistance - Patient 50 - 74%    Putting on/Taking off footwear   What is the patient wearing?: Orthosis Assist for footwear: Dependent - Patient 0%       Care Tool Toileting Toileting activity   Assist for toileting: Maximal Assistance - Patient 25 - 49%     Care Tool Bed Mobility Roll left and right activity   Roll left and right assist level: Contact Guard/Touching assist    Sit to lying activity   Sit to lying assist level: Contact Guard/Touching assist    Lying to sitting on side of bed activity   Lying to sitting on side of bed assist level: the ability to move from lying on the back to sitting on the side of the bed with no back support.: Contact  Guard/Touching assist     Care Tool Transfers Sit to stand transfer Sit to stand activity did not occur: Safety/medical concerns      Chair/bed transfer   Chair/bed transfer assist level: Contact Guard/Touching assist     Toilet transfer   Assist Level: Minimal Assistance - Patient > 75%     Care Tool Cognition  Expression of Ideas and Wants Expression of Ideas and Wants: 4. Without difficulty (complex and basic) - expresses complex messages without difficulty and with speech that is clear and easy to understand  Understanding Verbal and Non-Verbal Content Understanding Verbal and Non-Verbal Content: 4. Understands (complex and basic) - clear comprehension without cues or repetitions   Memory/Recall Ability Memory/Recall Ability : Current season;Location of own room;That he or she is in a hospital/hospital unit   Refer to Care Plan for Long Term Goals  SHORT TERM GOAL WEEK 1 OT Short Term Goal 1 (Week 1): pt will complete TTB and DABSC transfers with CGA OT Short Term Goal 2 (Week 1): pt will complete LB dressing with AE with set up OT Short Term Goal 3 (Week 1): Pt will perform ADL set up using w/c to gather items with dist s  Recommendations for other services: Neuropsych and Adult nurse group, Stress management, and Outing/community reintegration   Skilled Therapeutic Intervention ADL ADL Equipment Provided: Long-handled sponge Eating: Set up Where Assessed-Eating: Bed level Grooming: Setup Where Assessed-Grooming: Sitting at sink;Wheelchair Upper Body Bathing: Setup Where Assessed-Upper Body Bathing: Sitting at sink Lower Body Bathing: Moderate assistance Where Assessed-Lower Body Bathing: Sitting at sink;Wheelchair Upper Body Dressing: Setup Where Assessed-Upper Body Dressing: Sitting at sink Lower Body Dressing: Moderate assistance Where Assessed-Lower Body Dressing: Bed level Toileting: Moderate assistance Where Assessed-Toileting: Bedside  Commode Toilet Transfer: Moderate assistance Toilet Transfer Method: Squat pivot Toilet Transfer Equipment: Drop arm bedside commode Film/video editor Method: Administrator: Emergency planning/management officer ADL Comments: lateral transfer bed to w/c and DABSC, bed level LB self care for ease due to standing limitations, w/c sinkside for UB Mobility  Bed Mobility Bed Mobility: Rolling Right Rolling Right: Contact Guard/Touching assist  OT Intervention/Treatment:  Pt seen for full initial OT evaluation and training session this am. Pt in bed upon OT arrival. OT introduced role of therapy and purpose of session. Pt  open to all presented assessment and training this visit. OT assisted  and assessed ADL's, mobility, vision, sensation. cognition/lang, G/FMC, strength and balance throughout session. See above for levels. Pt  with WB restrictions B LE's, hx of falls and fx as well as CTS and psychiatric hx.  Pt will benefit from skilled OT services at CIR to maximize function and safety with recommendation to return home with mod I for UB BADL's and S for LB and mobility w/c level activity upon d/c home with family support/assist. Pt left at end of session in w/c with LE's elevated and chair alarm set, tray table and nurse call bell within reach.   Discharge Criteria: Patient will be discharged from OT if patient refuses treatment 3 consecutive times without medical reason, if treatment goals not met, if there is a change in medical status, if patient makes no progress towards goals or if patient is discharged from hospital.  The above assessment, treatment plan, treatment alternatives and goals were discussed and mutually agreed upon: by patient    Vicenta Dunning 09/26/2022, 12:57 PM

## 2022-09-26 NOTE — Progress Notes (Signed)
Inpatient Rehabilitation  Patient information reviewed and entered into eRehab system by Kelly Gentry, OTR/L, Rehab Quality Coordinator.   Information including medical coding, functional ability and quality indicators will be reviewed and updated through discharge.   

## 2022-09-26 NOTE — Plan of Care (Signed)
  Problem: RH Balance Goal: LTG: Patient will maintain dynamic sitting balance (OT) Description: LTG:  Patient will maintain dynamic sitting balance with assistance during activities of daily living (OT) Flowsheets (Taken 09/26/2022 0824) LTG: Pt will maintain dynamic sitting balance during ADLs with: Independent with assistive device   Problem: RH Grooming Goal: LTG Patient will perform grooming w/assist,cues/equip (OT) Description: LTG: Patient will perform grooming with assist, with/without cues using equipment (OT) Flowsheets (Taken 09/26/2022 0824) LTG: Pt will perform grooming with assistance level of: Independent with assistive device    Problem: RH Bathing Goal: LTG Patient will bathe all body parts with assist levels (OT) Description: LTG: Patient will bathe all body parts with assist levels (OT) Flowsheets (Taken 09/26/2022 0824) LTG: Pt will perform bathing with assistance level/cueing: Supervision/Verbal cueing   Problem: RH Dressing Goal: LTG Patient will perform upper body dressing (OT) Description: LTG Patient will perform upper body dressing with assist, with/without cues (OT). Flowsheets (Taken 09/26/2022 0824) LTG: Pt will perform upper body dressing with assistance level of: Independent with assistive device Goal: LTG Patient will perform lower body dressing w/assist (OT) Description: LTG: Patient will perform lower body dressing with assist, with/without cues in positioning using equipment (OT) Flowsheets (Taken 09/26/2022 0824) LTG: Pt will perform lower body dressing with assistance level of: Supervision/Verbal cueing   Problem: RH Toileting Goal: LTG Patient will perform toileting task (3/3 steps) with assistance level (OT) Description: LTG: Patient will perform toileting task (3/3 steps) with assistance level (OT)  Flowsheets (Taken 09/26/2022 0824) LTG: Pt will perform toileting task (3/3 steps) with assistance level: Supervision/Verbal cueing   Problem: RH Simple  Meal Prep Goal: LTG Patient will perform simple meal prep w/assist (OT) Description: LTG: Patient will perform simple meal prep with assistance, with/without cues (OT). Flowsheets (Taken 09/26/2022 0824) LTG: Pt will perform simple meal prep with assistance level of: Supervision/Verbal cueing   Problem: RH Laundry Goal: LTG Patient will perform laundry w/assist, cues (OT) Description: LTG: Patient will perform laundry with assistance, with/without cues (OT). Flowsheets (Taken 09/26/2022 0824) LTG: Pt will perform laundry with assistance level of: Supervision/Verbal cueing   Problem: RH Toilet Transfers Goal: LTG Patient will perform toilet transfers w/assist (OT) Description: LTG: Patient will perform toilet transfers with assist, with/without cues using equipment (OT) Flowsheets (Taken 09/26/2022 0824) LTG: Pt will perform toilet transfers with assistance level of: Supervision/Verbal cueing   Problem: RH Tub/Shower Transfers Goal: LTG Patient will perform tub/shower transfers w/assist (OT) Description: LTG: Patient will perform tub/shower transfers with assist, with/without cues using equipment (OT) Flowsheets (Taken 09/26/2022 0824) LTG: Pt will perform tub/shower stall transfers with assistance level of: Minimal Assistance - Patient > 75% LTG: Pt will perform tub/shower transfers from: Tub/shower combination Note: With TTB

## 2022-09-26 NOTE — H&P (Signed)
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  Per chart review patient lives with her son, daughter-in-law and 3 grandchildren.  Daughter-in-law home throughout the day, son works.  1 level home 3 steps to entry.  Independent without assistive device but does endorse 4-5 falls in the past 6 months.  She had been sleeping on an air mattress.  Presented 09/09/2022 after mechanical fall without loss of consciousness.  Patient reports both of her ankle rolled inward and she heard a pop from both ankles.  X-rays and imaging revealed left distal tibia fracture nondisplaced and right nondisplaced ankle fracture.  Podiatry services Dr. Nicholes Rough consulted no surgical plans indicated.  Left lower extremity below-knee fiberglass cast applied x 4 to 6 weeks.  Per latest podiatry note 09/21/2022 okay for minimal weightbearing right lower extremity in cam walker.  Bathroom and transition purposes only.  Nonweightbearing left lower extremity.  Placed on Lovenox for DVT prophylaxis.  Patient did have Xeroform applied to his superficial left heel abrasion.  Hospital course follow-up psychiatry services and currently remains on Paxil, Seroquel Trileptal and Calypta.  Urine culture 09/15/2022 E. coli and has completed antibiotic course.  Palliative care was consulted to establish goals of care.  Therapy evaluations completed due to patient decreased functional mobility limited weightbearing was admitted for a comprehensive rehab program.   Review of Systems  HENT:  Negative for hearing loss.   Eyes:  Negative for  blurred vision and double vision.  Respiratory:  Negative for cough, shortness of breath and wheezing.   Cardiovascular:  Positive for chest pain. Negative for leg swelling and PND.  Gastrointestinal:  Positive for constipation and nausea. Negative for heartburn.  Genitourinary:  Negative for dysuria, flank pain and hematuria.  Musculoskeletal:  Positive for back pain, falls and myalgias.  Skin:  Negative for rash.  Neurological:  Positive for seizures and headaches.  Psychiatric/Behavioral:         Anxiety/bipolar disorder/PTSD/schizophrenia  All other systems reviewed and are negative.       Past Medical History:  Diagnosis Date   Anemia     Elevated lithium level 05/10/2014   Gastric bypass status for obesity     H/O unstable angina     Hx SBO 09/30/2014   Hypoglycemia     MI (myocardial infarction) (HCC)     Orthostatic hypotension     Osteoporosis     Sciatic nerve disease     Seizures (HCC)     Thyroid disease     TIA (transient ischemic attack) 04/22/2017             Past Surgical History:  Procedure Laterality Date   ABDOMINAL ADHESION SURGERY       ABDOMINAL HYSTERECTOMY       CHOLECYSTECTOMY       DILATION AND CURETTAGE OF UTERUS       ESOPHAGOGASTRODUODENOSCOPY       GASTRIC BYPASS       GASTRIC BYPASS OPEN        revision   GASTROSTOMY W/ FEEDING TUBE       HERNIA REPAIR  OOPHORECTOMY       SPLENECTOMY       TONSILLECTOMY                 Family History  Problem Relation Age of Onset   Cancer Mother     Hypertension Mother     COPD Mother     Asthma Sister     Psoriasis Sister     Epilepsy Brother          Social History:  reports that she has never smoked. She has never used smokeless tobacco. She reports that she does not currently use alcohol. She reports that she does not use drugs. Allergies:  Allergies       Allergies  Allergen Reactions   Depakote [Divalproex Sodium] Shortness Of Breath   Pregabalin Other (See Comments), Rash  and Shortness Of Breath      Other Reaction: falls; decreased coordination   Pt ts that she passes out when she takes this med.    Other Reaction: falls; decreased coordination    Pt ts that she passes out when she takes this med.   Other Reaction: falls; decreased coordination, , Pt ts that she passes out when she takes this med.   Valproic Acid Other (See Comments) and Shortness Of Breath      Other Reaction: Other reaction   SOB   Levetiracetam Itching and Rash            Medications Prior to Admission  Medication Sig Dispense Refill   BELSOMRA 20 MG TABS Take 1 tablet by mouth at bedtime.       CAPLYTA 42 MG capsule Take 42 mg by mouth daily.       clonazePAM (KLONOPIN) 1 MG tablet Take 1 mg by mouth daily as needed for anxiety.       cyanocobalamin (VITAMIN B12) 1000 MCG/ML injection Inject 1 mL (1,000 mcg total) into the muscle every 30 (thirty) days. 1 mL 11   diclofenac Sodium (VOLTAREN) 1 % GEL Apply 2 g topically 4 (four) times daily as needed. 100 g 0   ergocalciferol (VITAMIN D2) 1.25 MG (50000 UT) capsule Take 1.25 mcg by mouth once a week.        GVOKE HYPOPEN 2-PACK 1 MG/0.2ML SOAJ Inject 1 mg into the skin as needed (hypoglycemia).       levothyroxine (SYNTHROID) 112 MCG tablet Take 112 mcg by mouth daily before breakfast.       naloxone (NARCAN) nasal spray 4 mg/0.1 mL as needed. As needed       oxcarbazepine (TRILEPTAL) 600 MG tablet Take 600 mg by mouth 2 (two) times daily. Pt states she only takes 600mg  twice a day per neurologist       oxyCODONE-acetaminophen (PERCOCET) 7.5-325 MG tablet Take 1 tablet by mouth 3 (three) times daily. 90 tablet 0   pantoprazole (PROTONIX) 40 MG tablet Take 40 mg by mouth daily.       PARoxetine (PAXIL) 40 MG tablet Take 40 mg by mouth daily.       prazosin (MINIPRESS) 2 MG capsule Take 2-4 mg by mouth at bedtime.       propranolol (INDERAL) 20 MG tablet Take 20 mg by mouth 2 (two) times daily as needed.       QUEtiapine  (SEROQUEL) 100 MG tablet Take 300 mg by mouth at bedtime.       ranolazine (RANEXA) 1000 MG SR tablet Take 1,000 mg by mouth 2 (two) times daily.  tiZANidine (ZANAFLEX) 4 MG tablet Take 1 tablet (4 mg total) by mouth 3 (three) times daily. 90 tablet 5   trazodone (DESYREL) 300 MG tablet Take 300 mg by mouth at bedtime.       atorvastatin (LIPITOR) 40 MG tablet Take 1 tablet (40 mg total) by mouth daily. (Patient not taking: Reported on 09/09/2022) 90 tablet 3              Home: Home Living Family/patient expects to be discharged to:: Private residence Living Arrangements: Children Available Help at Discharge: Family, Available 24 hours/day Type of Home: House Home Access: Stairs to enter [employer] of Steps: 3 in back, 6 in front Home Layout: One level Bathroom Shower/Tub: Tub/shower unit Home Equipment: Agricultural consultant (2 wheels) (but notes RW is too short for her) Additional Comments: Pt recently moved in with her son, daughter-in-law, & their 3 children. Daughter-in-law home throughout the day, son works. Pt was sleeping on air mattress.   Functional History: Prior Function Prior Level of Function : History of Falls (last six months), Independent/Modified Independent Mobility Comments: Pt ambulatory without AD, notes 4-5 falls in the past 6 months.   Functional Status:  Mobility: Bed Mobility Overal bed mobility: Modified Independent Bed Mobility: Supine to Sit Supine to sit: Min guard, HOB elevated Sit to supine: Min assist General bed mobility comments: sup<>long sit and sup<>sit Transfers Overall transfer level: Needs assistance Equipment used: None Transfers: Bed to chair/wheelchair/BSC Sit to Stand: Min assist Bed to/from chair/wheelchair/BSC transfer type:: Lateral/scoot transfer Anterior-Posterior transfers: Modified independent (Device/Increase time)  Lateral/Scoot Transfers: Min assist General transfer comment: deferred citing  fatigue Ambulation/Gait General Gait Details: not attempted Naval architect mobility: Yes Wheelchair propulsion: Both upper extremities Wheelchair parts: Needs assistance Distance: 25 (x 3 bouts) Wheelchair Assistance Details (indicate cue type and reason): patient required physical assistance for removing the leg rest and arm rests in preparation for lateral transfers using the transfer board. she needed intermittent physical assistance to unlock the right break. patient was able to propel around tight spaces with moderate cues for technique. she is able to propel a distance of up to 39ft before requiring rest break due to upper body fatigue   ADL: ADL Overall ADL's : Needs assistance/impaired Lower Body Dressing: Supervision/safety, Bed level, Sitting/lateral leans Lower Body Dressing Details (indicate cue type and reason): Pt performed lower body dressing bed level and sitting EOB with lateral leans. Pt able to respect pain and NWB status with lateral leans while seated. Toilet Transfer: Fish farm manager, Requires drop arm, Minimal assistance Toilet Transfer Details (indicate cue type and reason): Pt transferred to level surface, but terminated attempt that required transfering to elevated surface. Functional mobility during ADLs: Set up General ADL Comments: MIN A don/doff CAM boot long sitting in bed.   Cognition: Cognition Overall Cognitive Status: Within Functional Limits for tasks assessed Orientation Level: Oriented X4 Cognition Arousal/Alertness: Awake/alert Behavior During Therapy: WFL for tasks assessed/performed Overall Cognitive Status: Within Functional Limits for tasks assessed General Comments: Difficulty adhering to NWBing status with LLE   Physical Exam: Blood pressure 116/65, pulse 73, temperature 98 F (36.7 C), resp. rate 20, height 5\' 3"  (1.6 m), weight 92.3 kg, SpO2 100%. Physical Exam  PE: Constitution: Appropriate appearance for age. No  apparent distress +Obese Resp: No respiratory distress. No accessory muscle usage. on RA and CTAB Cardio: Well perfused appearance. No peripheral edema. Abdomen: Nondistended. Nontender.   Psych: Appropriate mood and affect. Neuro: AAOx4. No apparent cognitive deficits  Neurologic Exam:   DTRs: Reflexes were 2+ in bilateral achilles, patella, biceps, BR and triceps. Babinsky: flexor responses b/l.   Hoffmans: negative b/l Sensory exam: revealed normal sensation in all dermatomal regions in bilateral upper extremities, left lower extremity, and with reduced sensation to light touch in right dorsal foot Motor exam: strength 5/5 throughout bilateral upper extremities. Bilateral LE antigravity and against resistance with HF, KE; can wiggle bilateral toes.  Coordination: Fine motor coordination was normal.        Lab Results Last 48 Hours        Results for orders placed or performed during the hospital encounter of 09/09/22 (from the past 48 hour(s))  Glucose, capillary     Status: None    Collection Time: 09/23/22  7:30 AM  Result Value Ref Range    Glucose-Capillary 84 70 - 99 mg/dL      Comment: Glucose reference range applies only to samples taken after fasting for at least 8 hours.  Glucose, capillary     Status: None    Collection Time: 09/23/22 11:30 AM  Result Value Ref Range    Glucose-Capillary 96 70 - 99 mg/dL      Comment: Glucose reference range applies only to samples taken after fasting for at least 8 hours.  Glucose, capillary     Status: Abnormal    Collection Time: 09/23/22  5:07 PM  Result Value Ref Range    Glucose-Capillary 101 (H) 70 - 99 mg/dL      Comment: Glucose reference range applies only to samples taken after fasting for at least 8 hours.  Glucose, capillary     Status: Abnormal    Collection Time: 09/23/22  9:14 PM  Result Value Ref Range    Glucose-Capillary 104 (H) 70 - 99 mg/dL      Comment: Glucose reference range applies only to samples taken  after fasting for at least 8 hours.  Glucose, capillary     Status: None    Collection Time: 09/24/22  7:22 AM  Result Value Ref Range    Glucose-Capillary 88 70 - 99 mg/dL      Comment: Glucose reference range applies only to samples taken after fasting for at least 8 hours.  Glucose, capillary     Status: Abnormal    Collection Time: 09/24/22 11:26 AM  Result Value Ref Range    Glucose-Capillary 126 (H) 70 - 99 mg/dL      Comment: Glucose reference range applies only to samples taken after fasting for at least 8 hours.  Glucose, capillary     Status: None    Collection Time: 09/24/22  4:23 PM  Result Value Ref Range    Glucose-Capillary 89 70 - 99 mg/dL      Comment: Glucose reference range applies only to samples taken after fasting for at least 8 hours.  Glucose, capillary     Status: None    Collection Time: 09/24/22  9:19 PM  Result Value Ref Range    Glucose-Capillary 76 70 - 99 mg/dL      Comment: Glucose reference range applies only to samples taken after fasting for at least 8 hours.  Basic metabolic panel     Status: Abnormal    Collection Time: 09/25/22  3:30 AM  Result Value Ref Range    Sodium 135 135 - 145 mmol/L    Potassium 3.6 3.5 - 5.1 mmol/L    Chloride 103 98 - 111 mmol/L    CO2 23 22 -  32 mmol/L    Glucose, Bld 138 (H) 70 - 99 mg/dL      Comment: Glucose reference range applies only to samples taken after fasting for at least 8 hours.    BUN 13 6 - 20 mg/dL    Creatinine, Ser 1.61 0.44 - 1.00 mg/dL    Calcium 8.1 (L) 8.9 - 10.3 mg/dL    GFR, Estimated >09 >60 mL/min      Comment: (NOTE) Calculated using the CKD-EPI Creatinine Equation (2021)      Anion gap 9 5 - 15      Comment: Performed at [facility], [address] [place], [place] [address]  CBC     Status: Abnormal    Collection Time: 09/25/22  3:30 AM  Result Value Ref Range    WBC 8.1 4.0 - 10.5 K/uL    RBC 3.03 (L) 3.87 - 5.11 MIL/uL    Hemoglobin 9.7 (L) 12.0 - 15.0 g/dL    HCT  81.1 (L) 91.4 - 46.0 %    MCV 97.4 80.0 - 100.0 fL    MCH 32.0 26.0 - 34.0 pg    MCHC 32.9 30.0 - 36.0 g/dL    RDW 78.2 95.6 - 21.3 %    Platelets 552 (H) 150 - 400 K/uL    nRBC 0.0 0.0 - 0.2 %      Comment: Performed at [facility], [address], [place], [place] [address]      Imaging Results (Last 48 hours)  No results found.         Blood pressure 116/65, pulse 73, temperature 98 F (36.7 C), resp. rate 20, height 5\' 3"  (1.6 m), weight 92.3 kg, SpO2 100%.   Medical Problem List and Plan: 1. Functional deficits secondary to left distal tibia fracture nondisplaced and right nondisplaced ankle fracture.  Followed by podiatry services Dr. Nicholes Rough.  By latest note 09/21/2022 podiatry services minimal weightbearing right lower extremity in cam walker.  Bathroom and transition purposes only.  Nonweightbearing left lower extremity with below-knee fiberglass cast applied             -patient may shower             -ELOS/Goals: modified independent PT, modified independent OT, 10-12 days   - stable for IRF admission  - NWB LLE 4-6 weeks, minimal WB RLE in CAM walker w/ walker (Bathroom and transition purposes only) per podiatry; will need to clarify with Podiatry duration for RLE restrictions  - patient lives in a trailer and endorses doorways too small for wheelchair; supervision available but may not have physical assistance. Will need to discuss goals with team in AM.   2.  Antithrombotics: -DVT/anticoagulation:  Pharmaceutical: Lovenox check vascular             -antiplatelet therapy: N/A 3. Pain Management: Zanaflex 4 mg 3 times daily, oxycodone/Advil as needed 4. Mood/Behavior/Sleep: History of PTSD anxiety with schizophrenia.  Continue Caplyta, Paxil 40 mg daily, trazodone 300 mg nightly, Atarax as needed             - antipsychotic agents: Seroquel 300 mg nightly   - takes Belsomra 20 mg at bedtime at home; not on formulary, would discuss with patient bringing from  home   5. Neuropsych/cognition: This patient is capable of making decisions on her own behalf. 6. Skin/Wound Care: Xeroform over left heel wound 7. Fluids/Electrolytes/Nutrition: Routine and analysis with follow-up chemistries 8.  History of seizure disorder.  Trileptal  600 mg twice daily 9.  History of unstable angina.  Ranexa 1000 mg twice daily, Minipress 2 mg nightly 10.  Hypothyroidism.  Synthroid 11.  Hyperlipidemia.  Lipitor 12.  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  Follow-up dietary 13.  History of alcohol use.  Counseling. Patient denies currently.  14.  Anemia of chronic disease.  Continue iron supplement.  Follow-up CBC       Charlton Amor, PA-C 09/25/2022  I have examined the patient independently and edited the note for HPI, ROS, exam, assessment, and plan as appropriate. I am in agreement with the above recommendations.   Angelina Sheriff, DO 09/26/2022

## 2022-09-26 NOTE — Progress Notes (Signed)
PROGRESS NOTE   Subjective/Complaints: Pt sitting edge of bed. No events overnight. Pt reports she slept better last night. She reports poor sleep while at hospital due to not having belsomra, but says she got it last night. Later in the morning nursing reports she was requesting belsomra for tonight to help with sleep. Advised she could have family bring over her home belsomra as it is not formulary.  No additional concerns. She reports RLE is not painful but she does have pain in LLE. Oxycodone is helping the pain.   ROS: Patient denies fever, rash, sore throat, blurred vision, dizziness, nausea, vomiting, diarrhea, cough, shortness of breath or chest pain, back/neck pain, headache, or mood change.   Objective:   No results found. Recent Labs    09/25/22 0330 09/26/22 0500  WBC 8.1 7.4  HGB 9.7* 11.5*  HCT 29.5* 34.9*  PLT 552* 587*   Recent Labs    09/25/22 0330 09/26/22 0500  NA 135 133*  K 3.6 4.5  CL 103 101  CO2 23 23  GLUCOSE 138* 92  BUN 13 11  CREATININE 0.63 0.71  CALCIUM 8.1* 8.5*    Intake/Output Summary (Last 24 hours) at 09/26/2022 1301 Last data filed at 09/26/2022 1610 Gross per 24 hour  Intake 790 ml  Output 275 ml  Net 515 ml        Physical Exam: Vital Signs Blood pressure (!) 114/52, pulse 78, temperature 98.6 F (37 C), temperature source Oral, resp. rate 17, height 5\' 3"  (1.6 m), weight 90.7 kg, SpO2 99%.   General: Obese, No apparent distress HEENT: Head is normocephalic, atraumatic, MMM Neck: Supple without JVD or lymphadenopathy Heart: Reg rate and rhythm. No murmurs rubs or gallops Chest: CTA bilaterally without wheezes, rales, or rhonchi; no distress Abdomen: Soft, non-tender, non-distended, bowel sounds positive. Extremities: Right lower extremity Cam walker, left lower extremity cast in place Psych: Pt's affect is appropriate. Pt is cooperative Skin: Clean and intact without  signs of breakdown Neuro: alert and awake, follows commands, cranial nerves II through XII grossly intact Strength 5 out of 5 in bilateral upper extremities Able to lift bilateral lower extremities to gravity with hip flexion and knee extension Able to wiggle her toes bilaterally  Assessment/Plan: 1. Functional deficits which require 3+ hours per day of interdisciplinary therapy in a comprehensive inpatient rehab setting. Physiatrist is providing close team supervision and 24 hour management of active medical problems listed below. Physiatrist and rehab team continue to assess barriers to discharge/monitor patient progress toward functional and medical goals  Care Tool:  Bathing    Body parts bathed by patient: Right arm, Left arm, Chest, Abdomen, Front perineal area, Buttocks, Right upper leg, Left upper leg, Face   Body parts bathed by helper: Right lower leg Body parts n/a: Left lower leg   Bathing assist Assist Level: Moderate Assistance - Patient 50 - 74%     Upper Body Dressing/Undressing Upper body dressing   What is the patient wearing?: Pull over shirt    Upper body assist Assist Level: Set up assist    Lower Body Dressing/Undressing Lower body dressing      What is the patient  wearing?: Pants     Lower body assist Assist for lower body dressing: Moderate Assistance - Patient 50 - 74%     Toileting Toileting    Toileting assist Assist for toileting: Maximal Assistance - Patient 25 - 49%     Transfers Chair/bed transfer  Transfers assist  Chair/bed transfer activity did not occur: Safety/medical concerns  Chair/bed transfer assist level: Contact Guard/Touching assist     Locomotion Ambulation   Ambulation assist   Ambulation activity did not occur: Safety/medical concerns (2/2 high pain and BLE WB restrictions)          Walk 10 feet activity   Assist  Walk 10 feet activity did not occur: Safety/medical concerns (2/2 high pain and BLE WB  restrictions)        Walk 50 feet activity   Assist Walk 50 feet with 2 turns activity did not occur: Safety/medical concerns (2/2 high pain and BLE WB restrictions)         Walk 150 feet activity   Assist Walk 150 feet activity did not occur: Safety/medical concerns (2/2 high pain and BLE WB restrictions)         Walk 10 feet on uneven surface  activity   Assist Walk 10 feet on uneven surfaces activity did not occur: Safety/medical concerns (2/2 high pain and BLE WB restrictions)         Wheelchair     Assist Is the patient using a wheelchair?: Yes Type of Wheelchair: Manual    Wheelchair assist level: Supervision/Verbal cueing Max wheelchair distance: 152ft    Wheelchair 50 feet with 2 turns activity    Assist        Assist Level: Supervision/Verbal cueing   Wheelchair 150 feet activity     Assist      Assist Level: Supervision/Verbal cueing   Blood pressure (!) 114/52, pulse 78, temperature 98.6 F (37 C), temperature source Oral, resp. rate 17, height 5\' 3"  (1.6 m), weight 90.7 kg, SpO2 99%.  Medical Problem List and Plan: 1. Functional deficits secondary to left distal tibia fracture nondisplaced and right nondisplaced ankle fracture.  Followed by podiatry services Dr. Nicholes Rough.  By latest note 09/21/2022 podiatry services minimal weightbearing right lower extremity in cam walker.  Bathroom and transition purposes only.  Nonweightbearing left lower extremity with below-knee fiberglass cast applied             -patient may shower             -ELOS/Goals: modified independent PT, modified independent OT, 10-12 days             - NWB LLE 4-6 weeks, minimal WB RLE in CAM walker w/ walker (Bathroom and transition purposes only) per podiatry; will need to clarify with Podiatry duration for RLE restrictions  - patient lives in a trailer and endorses doorways too small for wheelchair; supervision available but may not have physical  assistance. Will need to discuss goals with team in AM.    -Continue CIR, evaluations today   2.  Antithrombotics: -DVT/anticoagulation:  Pharmaceutical: Lovenox check vascular             -antiplatelet therapy: N/A 3. Pain Management: Zanaflex 4 mg 3 times daily, oxycodone/Advil as needed  -7/19 continue oxycodone for lower extremity pain 4. Mood/Behavior/Sleep: History of PTSD anxiety with schizophrenia.  Continue Caplyta, Paxil 40 mg daily, trazodone 300 mg nightly, Atarax as needed             -  antipsychotic agents: Seroquel 300 mg nightly              - takes Belsomra 20 mg at bedtime at home; not on formulary, would discuss with patient bringing from home    -7/19 patient reports she slept better last night however she would like to restart Belsomra.  Will see if patient can have her family member bring in her medication for use in the hospital.  She reports chronic insomnia 5. Neuropsych/cognition: This patient is capable of making decisions on her own behalf. 6. Skin/Wound Care: Xeroform over left heel wound 7. Fluids/Electrolytes/Nutrition: Routine and analysis with follow-up chemistries 8.  History of seizure disorder.  Trileptal 600 mg twice daily 9.  History of unstable angina.  Ranexa 1000 mg twice daily, Minipress 2 mg nightly 10.  Hypothyroidism.  Synthroid 11.  Hyperlipidemia.  Lipitor 12.  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  Follow-up dietary 13.  History of alcohol use.  Counseling. Patient denies currently.  14.  Anemia of chronic disease.  Continue iron supplement.  Follow-up CBC  -7/19 Hgb improved to 11.5 continue to monitor 15.  Thrombocytosis.  Likely reactive, recheck Monday 16.  Hyponatremia mild.  Appears to be occurring intermittently on chronic basis, recheck Monday   LOS: 1 days A FACE TO FACE EVALUATION WAS PERFORMED  Fanny Dance 09/26/2022, 1:01 PM

## 2022-09-26 NOTE — Progress Notes (Signed)
Occupational Therapy Session Note  Patient Details  Name: Diana Quinn MRN: [id number] Date of Birth: 04/29/1965  Today's Date: 09/26/2022 OT Individual Time: 6433-2951 OT Individual Time Calculation (min): 59 min   Short Term Goals: Week 1:  OT Short Term Goal 1 (Week 1): pt will complete TTB and DABSC transfers with CGA OT Short Term Goal 2 (Week 1): pt will complete LB dressing with AE with set up OT Short Term Goal 3 (Week 1): Pt will perform ADL set up using w/c to gather items with dist s  Skilled Therapeutic Interventions/Progress Updates:     Pt received sitting up in bed with nursing staff present in room. Pt reporting she was feeling a little fatigued and worried her blood sugar had dropped. Nursing staff assessed vials and blood sugar with all within normal limits for Pt. Offered to obtain snack or drink for Pt, however Pt politely refusing. Pt presenting to be in good spirits receptive to skilled OT session reporting 0/10 pain- OT offering intermittent rest breaks, repositioning, and therapeutic support to optimize participation in therapy session. Pt reporting pain primarily occurs when LLE is "dangling" in chair with no pain present at rest when LLE is supported.   Pt donned pants in bed rolling R/L to bring pants to waist with min A and min verbal cues for technique and to maintain precautions. Donned CAM boot on RLE total A for time management. Supine>EOB supervision. Pt completed lateral scoot EOB>wc with min A.   Transported Pt total A to tub room in wc for time management. Educated Pt on TTB purpose and transfer technique with Pt receptive. Provided demonstration and verbal instructions with opportunity to practice following. Pt able to complete lateral pivot wc<>TTB with min A to guide hips and mod verbal cues for technique d/t first learning experience. Pt receptive to using TTB at d/c.   Transported Pt total A to therapy gym in wc. Engaged Pt in completing 9 Hole Peg Test  for [facility], speed, and dexterity:  Right Hand:   Trial 1: 32.27 Trial 2: 26.26 Trial 3: 25.29 Average: 27.94 sec Left Dominant hand:  Trial 1: 28.88 Trial 2: 22.94 Trial 3: 21.40 Average: 24.40 sec Pt scored within normal limits for her age group and gender.   The Dynamometer Grip Strength Test is a quantitative and objective measure of isometric muscular strength of the hand and forearm. The patient was asked to sit with their back, pelvis, and knees at 90 degrees. The shoulder was adducted and neutrally rotated with The elbow flexed to 90 degrees and forearm in neutral. The arm was not supported. The score was determined by calculating the average of 3 trials. Pt scored WNL for her age group and gender.  R Hand:  Trial 1: 65 Trial 2: 65 Trial 3: 64 Average: 64.66 lbs  L Hand Trial 1: 64 Trial 2: 58 Trail 3: 61 Average: 61.00 lbs  -Norms: Female Average in lbs 55-59 R 57.3 L 47.3 60-64 R 55.1 L 45.7 65-69 R 49.6 L 41.0 70-74 R 49.6 L 41.5 75+ R 42.6 L 37.6   Transported Pt back to her room total A in her wc. Pt completed lateral scoot wc>EOB min A. EOB>supine supervision. Pt was left resting in bed with call bell in reach, bed alarm on, and all needs met.    Therapy Documentation Precautions:  Restrictions Weight Bearing Restrictions: Yes RLE Weight Bearing: Partial weight bearing (with cam boot) LLE Weight Bearing: Non weight bearing  Therapy/Group: Individual  Therapy  Army Fossa 09/26/2022, 8:06 AM

## 2022-09-26 NOTE — Progress Notes (Signed)
Inpatient Rehabilitation Care Coordinator Assessment and Plan Patient Details  Name: Diana Quinn MRN: [id number] Date of Birth: 05-Oct-1965  Today's Date: 09/26/2022  Hospital Problems: Principal Problem:   Bilateral ankle fractures  Past Medical History:  Past Medical History:  Diagnosis Date   Anemia    Elevated lithium level 05/10/2014   Gastric bypass status for obesity    H/O unstable angina    Hx SBO 09/30/2014   Hypoglycemia    MI (myocardial infarction) (HCC)    Orthostatic hypotension    Osteoporosis    Sciatic nerve disease    Seizures (HCC)    Thyroid disease    TIA (transient ischemic attack) 04/22/2017   Past Surgical History:  Past Surgical History:  Procedure Laterality Date   ABDOMINAL ADHESION SURGERY     ABDOMINAL HYSTERECTOMY     CHOLECYSTECTOMY     DILATION AND CURETTAGE OF UTERUS     ESOPHAGOGASTRODUODENOSCOPY     GASTRIC BYPASS     GASTRIC BYPASS OPEN     revision   GASTROSTOMY W/ FEEDING TUBE     HERNIA REPAIR     OOPHORECTOMY     SPLENECTOMY     TONSILLECTOMY     Social History:  reports that she has never smoked. She has never used smokeless tobacco. She reports that she does not currently use alcohol. She reports that she does not use drugs.  Family / Support Systems Marital Status: Single Patient Roles: Parent, Other (Comment) (grandmother) Children: [name] [phone] Three other son;s who live out of state Other Supports: Daughter in-law Anticipated Caregiver: Daughter in-law has had a CVA and can only provide supervision Ability/Limitations of Caregiver: Daugher in-law's health issues and son works second shift 12-2am Three grandchildren in the home 18,16 and 9 Caregiver Availability: Intermittent Family Dynamics: Close with eldest son whom she is staying with since her fiance's death in 2022/05/20 of this year. Pt has been through a lot in a short amount of time and is taking each day at a time  Social History Preferred language:  English Religion: Wiccan Cultural Background: No issues Education: HS Health Literacy - How often do you need to have someone help you when you read instructions, pamphlets, or other written material from your doctor or pharmacy?: Never Writes: Yes Employment Status: Disabled Marine scientist Issues: No issues Guardian/Conservator: None-according to MD pt is capable of making her own decisions while here.   Abuse/Neglect Abuse/Neglect Assessment Can Be Completed: Yes Physical Abuse: Denies Verbal Abuse: Denies Sexual Abuse: Denies Exploitation of patient/patient's resources: Denies Self-Neglect: Denies  Patient response to: Social Isolation - How often do you feel lonely or isolated from those around you?: Never  Emotional Status Pt's affect, behavior and adjustment status: Pt is trying to be positive and work on waht she can. She has fractured multiple bones in the past due to her OA and other health issues. She has been independent and been living with son and his family since 05-20-22 after fiance death. Recent Psychosocial Issues: other health issues-been through much Psychiatric History: HX-bipolar/schziophrenia has an outside psych therapist who manages her medications she feels they are helping her stay stable. Woiuld benefit from seeing neuro-psych while here for coping. Have placed on list to be seen Substance Abuse History: No issues  Patient / Family Perceptions, Expectations & Goals Pt/Family understanding of illness & functional limitations: Pt is able to expalin her fractures and WB issues. She has broken multiple bones before and has managed, just not two together.  She does talk with the MD and feels she understnads what her plan is moving forward. Premorbid pt/family roles/activities: mom,retiree, grandmother, etc Anticipated changes in roles/activities/participation: resume Pt/family expectations/goals: Pt states: " I hope to do well and get where I can move  better on my own. I know my weight bearing issues and how long it needs to be like this."  [employer]: None Premorbid Home Care/DME Agencies: None Transportation available at discharge: son Is the patient able to respond to transportation needs?: Yes In the past 12 months, has lack of transportation kept you from medical appointments or from getting medications?: No In the past 12 months, has lack of transportation kept you from meetings, work, or from getting things needed for daily living?: No Resource referrals recommended: Neuropsychology  Discharge Planning Living Arrangements: Children, Other relatives Support Systems: Children, Other relatives, Friends/neighbors Type of Residence: Private residence Insurance Resources: [employer] (specify county) Surveyor, quantity Resources: [employer] Financial Screen Referred: Previously completed Living Expenses: Lives with family Money Management: Patient, Family Does the patient have any problems obtaining your medications?: No Home Management: self and daughter in-law Patient/Family Preliminary Plans: Return home with son and his family. he works second shift and hs wife has recently had a stroke, so not able to assist. Between both and grandchildren will have supervision level. Barriers are steps and getting her more mobile. Will await team's evaluations and problem solving best plan for pt. Care Coordinator Barriers to Discharge: Inaccessible home environment, Weight bearing restrictions, Decreased caregiver support Care Coordinator Anticipated Follow Up Needs: HH/OP  Clinical Impression Pleasant unfortante female who fractured bot of her ankles and is NWB. Her son's family is supportive but works and daughter in-law has health issues. Will await team's evaluations and work on discharge needs. Placed on neuro-psych list to be seen this coming week.  [name] 09/26/2022, 10:09 AM

## 2022-09-26 NOTE — Progress Notes (Signed)
Patient ID: Diana Quinn, female   DOB: Oct 14, 1965, 57 y.o.   MRN: [id number] Met with the patient to review current situation, rehab process, team conference and plan of care. Reviewed hx of falls, brittle bones/multi fractures with dietary modification recommendations and supplements ordered. Was taking Fosamax but wondering if she can have another medication for treatment? Also history of MDD/anxiety/insomnia; taking Belsomra with Trazodone and seroquel. Questions forwarded to MD/PAC. Reviewed pain management, anemia with iron supplement and prevention of constipation and UTIs. Expressed concerns regarding w/c access to son's home and steep steps to entry of rental home; ramp installation will not be allowed.  Forwarded concerns to her team.  Reviewed secondary risk management, medications and diet. Continue to follow along to address educational needs to facilitate preparation for discharge. Pamelia Hoit

## 2022-09-26 NOTE — Discharge Instructions (Addendum)
Inpatient Rehab Discharge Instructions  [name] Discharge date and time: No discharge date for patient encounter.   Activities/Precautions/ Functional Status: Activity:  Nonweightbearing bilateral lower extremities Diet: regular diet/1500 ml fluid restriction Wound Care: Routine skin checks Functional status:  ___ No restrictions     ___ Walk up steps independently ___ 24/7 supervision/assistance   ___ Walk up steps with assistance ___ Intermittent supervision/assistance  ___ Bathe/dress independently ___ Walk with walker     _x__ Bathe/dress with assistance ___ Walk Independently    ___ Shower independently ___ Walk with assistance    ___ Shower with assistance ___ No alcohol     ___ Return to work/school ________  Special Instructions:   No Driving smoking or alcohol  Follow-up with Primary care provider on monitoring of sodium levels    COMMUNITY REFERRALS UPON DISCHARGE:    Home Exercise program until can weight bear and then will need Outpatient Rehab    Medical Equipment/Items Ordered:HOSPITAL BED, WHEELCHAIR, DROP-ARM BEDSIDE COMMODE AND TUB BENCH WILL GET ROLLING WALKER AND TUB SEAT ON OWN                                                 Agency/Supplier:[facility]   [phone]    My questions have been answered and I understand these instructions. I will adhere to these goals and the provided educational materials after my discharge from the hospital.  Patient/Caregiver Signature _______________________________ Date __________  Clinician Signature _______________________________________ Date __________  Please bring this form and your medication list with you to all your follow-up doctor's appointments.

## 2022-09-26 NOTE — Progress Notes (Signed)
Inpatient Rehabilitation Center Individual Statement of Services  Patient Name:  Diana Quinn  Date:  09/26/2022  Welcome to the Inpatient Rehabilitation Center.  Our goal is to provide you with an individualized program based on your diagnosis and situation, designed to meet your specific needs.  With this comprehensive rehabilitation program, you will be expected to participate in at least 3 hours of rehabilitation therapies Monday-Friday, with modified therapy programming on the weekends.  Your rehabilitation program will include the following services:  Physical Therapy (PT), Occupational Therapy (OT), 24 hour per day rehabilitation nursing, Therapeutic Recreaction (TR), Neuropsychology, Care Coordinator, Rehabilitation Medicine, Nutrition Services, and Pharmacy Services  Weekly team conferences will be held on Wednesday to discuss your progress.  Your Inpatient Rehabilitation Care Coordinator will talk with you frequently to get your input and to update you on team discussions.  Team conferences with you and your family in attendance may also be held.  Expected length of stay: 10-12 days  Overall anticipated outcome: supervision wheelchair level  Depending on your progress and recovery, your program may change. Your Inpatient Rehabilitation Care Coordinator will coordinate services and will keep you informed of any changes. Your Inpatient Rehabilitation Care Coordinator's name and contact numbers are listed  below.  The following services may also be recommended but are not provided by the Inpatient Rehabilitation Center:   Home Health Rehabiltiation Services Outpatient Rehabilitation Services    Arrangements will be made to provide these services after discharge if needed.  Arrangements include referral to agencies that provide these services.  Your insurance has been verified to be:  [employer]-medicaid Your primary doctor is:  Meryl Crutch  Pertinent information  will be shared with your doctor and your insurance company.  Inpatient Rehabilitation Care Coordinator:  Dossie Der, [place] [phone] or Luna Glasgow  Information discussed with and copy given to patient by: [name], 09/26/2022, 10:11 AM

## 2022-09-26 NOTE — Evaluation (Signed)
Physical Therapy Assessment and Plan  Patient Details  Name: Diana Quinn MRN: [id number] Date of Birth: 08/22/1965  PT Diagnosis: Difficulty walking, Muscle weakness, and Pain in joint Rehab Potential: Good ELOS: 10-12 days   Today's Date: 09/26/2022 PT Individual Time: 4098-1191 PT Individual Time Calculation (min): 70 min    Hospital Problem: Principal Problem:   Bilateral ankle fractures   Past Medical History:  Past Medical History:  Diagnosis Date   Anemia    Elevated lithium level 05/10/2014   Gastric bypass status for obesity    H/O unstable angina    Hx SBO 09/30/2014   Hypoglycemia    MI (myocardial infarction) (HCC)    Orthostatic hypotension    Osteoporosis    Sciatic nerve disease    Seizures (HCC)    Thyroid disease    TIA (transient ischemic attack) 04/22/2017   Past Surgical History:  Past Surgical History:  Procedure Laterality Date   ABDOMINAL ADHESION SURGERY     ABDOMINAL HYSTERECTOMY     CHOLECYSTECTOMY     DILATION AND CURETTAGE OF UTERUS     ESOPHAGOGASTRODUODENOSCOPY     GASTRIC BYPASS     GASTRIC BYPASS OPEN     revision   GASTROSTOMY W/ FEEDING TUBE     HERNIA REPAIR     OOPHORECTOMY     SPLENECTOMY     TONSILLECTOMY      Assessment & Plan Clinical Impression: Patient is a 57 year old right-handed female with history of PTSD/bipolar/schizophrenia/anxiety maintained on Caplyta,/type 2 diabetes mellitus, unstable angina, 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 Per chart review patient lives with her son, daughter-in-law and 3 grandchildren. Daughter-in-law home throughout the day, son works. 1 level home 3 steps to entry. Independent without assistive device but does endorse 4-5 falls in the past 6 months. She had been sleeping on an air mattress. Presented 09/09/2022 after mechanical fall without loss of consciousness. Patient  reports both of her ankle rolled inward and she heard a pop from both ankles. X-rays and imaging revealed left distal tibia fracture nondisplaced and right nondisplaced ankle fracture. Podiatry services Dr. Nicholes Rough consulted no surgical plans indicated. Left lower extremity below-knee fiberglass cast applied x 4 to 6 weeks. Per latest podiatry note 09/21/2022 okay for minimal weightbearing right lower extremity in cam walker. Bathroom and transition purposes only. Nonweightbearing left lower extremity. Placed on Lovenox for DVT prophylaxis. Patient did have Xeroform applied to his superficial left heel abrasion. Hospital course follow-up psychiatry services and currently remains on Paxil, Seroquel Trileptal and Calypta. Urine culture 09/15/2022 E. coli and has completed antibiotic course. Palliative care was consulted to establish goals of care. Therapy evaluations completed due to patient decreased functional mobility limited weightbearing was admitted for a comprehensive rehab program.  Patient transferred to CIR on 09/25/2022 .   Patient currently requires min with mobility secondary to  muscle weakness and BLE weight bearing restrictions .  Prior to hospitalization, patient was independent  with mobility and lived with Son (living with son until she finds an apartment) in a House home.  Home access is 7 steps at son's houseStairs to enter.  Patient will benefit from skilled PT intervention to maximize safe functional mobility, minimize fall risk, and decrease caregiver burden for planned discharge home with 24 hour supervision.  Anticipate patient will benefit from follow up HH at discharge.  PT - End of Session Activity Tolerance:  Tolerates 30+ min activity with multiple rests Endurance Deficit: Yes Endurance Deficit Description: 2/2 carpal tunnel and endurance for WC propulsion PT Assessment Rehab Potential (ACUTE/IP ONLY): Good PT Barriers to Discharge: Inaccessible home environment;Weight bearing  restrictions PT Barriers to Discharge Comments: 7STE; pt has strong WB restrictions PT Patient demonstrates impairments in the following area(s): Balance;Endurance;Motor;Pain;Safety PT Transfers Functional Problem(s): Bed Mobility;Bed to Chair;Car PT Locomotion Functional Problem(s): Wheelchair Mobility;Stairs PT Plan PT Intensity: Minimum of 1-2 x/day ,45 to 90 minutes PT Frequency: 5 out of 7 days PT Duration Estimated Length of Stay: 10-12 days PT Treatment/Interventions: Balance/vestibular training;Discharge planning;Community reintegration;Disease management/prevention;DME/adaptive equipment instruction;Functional mobility training;Pain management;Patient/family education;Stair training;Therapeutic Activities;Therapeutic Exercise;UE/LE Strength taining/ROM;UE/LE Coordination activities;Wheelchair propulsion/positioning PT Transfers Anticipated Outcome(s): Supervision PT Locomotion Anticipated Outcome(s): Independent with WC, Supervision with stairs (boosting up stairs) PT Recommendation Follow Up Recommendations: Home health PT Patient destination: Home Equipment Recommended: Wheelchair cushion (measurements);Wheelchair (measurements);Sliding board;To be determined;Rolling walker with 5" wheels Equipment Details: pt measuring home for [facility] access   PT Evaluation Precautions/Restrictions Precautions Precautions: Fall Restrictions Weight Bearing Restrictions: Yes RLE Weight Bearing: Partial weight bearing RLE Partial Weight Bearing Percentage or Pounds: minimal with transfers only LLE Weight Bearing: Non weight bearing General Chart Reviewed: Yes Family/Caregiver Present: No Vital Signs  Pain Pain Assessment Pain Scale: 0-10 Pain Score: (P) 8  Pain Type: (P) Acute pain Pain Location: (P) Leg Pain Orientation: (P) Right Pain Radiating Towards: left foot Pain Descriptors / Indicators: (P) Aching Pain Frequency: (P) Intermittent Pain Onset: (P) Progressive Patients Stated Pain  Goal: (P) 2 Pain Intervention(s): (P) Medication (See eMAR) Multiple Pain Sites: No Pain Interference Pain Interference Pain Effect on Sleep: 3. Frequently Pain Interference with Therapy Activities: 3. Frequently Pain Interference with Day-to-Day Activities: 4. Almost constantly Home Living/Prior Functioning Home Living Living Arrangements: Children;Other relatives Available Help at Discharge: Family;Available 24 hours/day (daughter in  law) Type of Home: House Home Access: Stairs to enter [employer] of Steps: 7 steps at son's house Entrance Stairs-Rails: Right;Left (cannot reach both at same time) Home Layout: One level Bathroom Shower/Tub: Tub/shower unit (shower chair in tub) Bathroom Toilet: Standard Additional Comments: unsure if WC can go into bathroom as it has narrow dorrway, pt given home measurement sheet  Lives With: Son (living with son until she finds an apartment) Prior Function Level of Independence: Independent with gait  Able to Take Stairs?: Yes Vision/Perception  Vision - History Ability to See in Adequate Light: 0 Adequate  Cognition Overall Cognitive Status: Within Functional Limits for tasks assessed Arousal/Alertness: Awake/alert Orientation Level: Oriented X4 Sensation Sensation Light Touch: Not tested (2/2 cast on BLE) Additional Comments: pt reports feeling some dull sensation in BLE Coordination Gross Motor Movements are Fluid and Coordinated: Not tested (2/2 cast on BLE) Fine Motor Movements are Fluid and Coordinated: Not tested (2/2 cast on BLE) Motor  Motor Motor: Other (comment) Motor - Skilled Clinical Observations: R hip fl fluid and controlled, L hip fl decreased 2/2 pain in knee cap with activity, other mobility not tested 2/2 casting on BLE   Trunk/Postural Assessment  Cervical Assessment Cervical Assessment: Within Functional Limits Thoracic Assessment Thoracic Assessment: Within Functional Limits Lumbar  Assessment Lumbar Assessment: Within Functional Limits Postural Control Postural Control: Within Functional Limits  Balance Balance Balance Assessed: Yes Dynamic Sitting Balance Dynamic Sitting - Balance Support: No upper extremity supported;Feet unsupported Dynamic Sitting - Level of Assistance: 5: Stand by assistance Dynamic Sitting - Balance Activities: Forward lean/weight shifting;Lateral lean/weight shifting Extremity Assessment  RLE Assessment RLE Assessment: Not tested General Strength Comments: gross 3+/5 in hip, other areas not tested 2/2 casting LLE Assessment LLE Assessment: Not tested General Strength Comments: gross 3/5 in hip, other areas not tested 2/2 casting  Care Tool Care Tool Bed Mobility Roll left and right activity   Roll left and right assist level: Contact Guard/Touching assist    Sit to lying activity   Sit to lying assist level: Contact Guard/Touching assist    Lying to sitting on side of bed activity   Lying to sitting on side of bed assist level: the ability to move from lying on the back to sitting on the side of the bed with no back support.: Contact Guard/Touching assist     Care Tool Transfers Sit to stand transfer Sit to stand activity did not occur: Safety/medical concerns (2/2 high pain and BLE WB restrictions)      Chair/bed transfer   Chair/bed transfer assist level: Contact Guard/Touching assist     Toilet transfer   Assist Level: Minimal Assistance - Patient > 75%    Car transfer Car transfer activity did not occur: Safety/medical concerns (2/2 high pain and BLE WB restrictions)        Care Tool Locomotion Ambulation Ambulation activity did not occur: Safety/medical concerns (2/2 high pain and BLE WB restrictions)        Walk 10 feet activity Walk 10 feet activity did not occur: Safety/medical concerns (2/2 high pain and BLE WB restrictions)       Walk 50 feet with 2 turns activity Walk 50 feet with 2 turns activity  did not occur: Safety/medical concerns (2/2 high pain and BLE WB restrictions)      Walk 150 feet activity Walk 150 feet activity did not occur: Safety/medical concerns (2/2 high pain and BLE WB restrictions)      Walk 10 feet on uneven surfaces activity Walk 10 feet on uneven surfaces activity did not occur: Safety/medical concerns (2/2 high pain and BLE WB restrictions)      Stairs Stair activity did not occur: Safety/medical concerns (2/2 high pain and BLE WB restrictions)        Walk up/down 1 step activity Walk up/down 1 step or curb (drop down) activity did not occur: Safety/medical concerns (2/2 high pain and BLE WB restrictions)      Walk up/down 4 steps activity Walk up/down 4 steps activity did not occur: Safety/medical concerns (2/2 high pain and BLE WB restrictions)      Walk up/down 12 steps activity Walk up/down 12 steps activity did not occur: Safety/medical concerns (2/2 high pain and BLE WB restrictions)      Pick up small objects from floor Pick up small object from the floor (from standing position) activity did not occur: Safety/medical concerns (2/2 high pain and BLE WB restrictions)      Wheelchair Is the patient using a wheelchair?: Yes Type of Wheelchair: Manual   Wheelchair assist level: Supervision/Verbal cueing Max wheelchair distance: 159ft  Wheel 50 feet with 2 turns activity   Assist Level: Supervision/Verbal cueing  Wheel 150 feet activity   Assist Level: Supervision/Verbal cueing    Refer to Care Plan for Long Term Goals  SHORT TERM GOAL WEEK 1 PT Short Term Goal 1 (Week 1): Pt will completed bed mobility with S PT Short Term Goal 2 (Week 1): Pt will completed slide transfer with S + slideboard PT Short Term Goal 3 (Week 1): Pt will complete WC propulsion with S PT  Short Term Goal 4 (Week 1): Pt will complete car transfer with CGA + LRAD PT Short Term Goal 5 (Week 1): Pt will initiate stair training  Recommendations for other services: None    Skilled Therapeutic Intervention Mobility Bed Mobility Bed Mobility: Rolling Right Rolling Right: Contact Guard/Touching assist Transfers Transfers: Lateral/Scoot Transfers Lateral/Scoot Transfers: Contact Guard/Touching assist Transfer (Assistive device): Other (Comment) (slide board) Locomotion  Gait Ambulation: No Gait Gait: No Stairs / Additional Locomotion Stairs: No Wheelchair Mobility Wheelchair Mobility: Yes Wheelchair Parts Management: Supervision/cueing Distance: 1100ft  Session Note: Chart reviewed and pt agreeable to therapy. Pt received seated in WC with 8/10 c/o pain LLE. Session focused on evaluation and initiation of functional transfer training and WC mobility to promote safe home access. Pt initiated session with evaluation as described above. Pt then completed WC propulsion of 168ft with S and VC for management of leg rest in set up for transfer. Pt then completed slideboard transfer to bed using CGA + slideboard and return to supine with CGA.Marland Kitchen Session education emphasized therapy goals, role of PT, care continuum, and [facility] policies. At end of session, pt was left semi-reclined in bed with alarm engaged, nurse call bell and all needs in reach.    Discharge Criteria: Patient will be discharged from PT if patient refuses treatment 3 consecutive times without medical reason, if treatment goals not met, if there is a change in medical status, if patient makes no progress towards goals or if patient is discharged from hospital.  The above assessment, treatment plan, treatment alternatives and goals were discussed and mutually agreed upon: by patient  Dionne Milo 09/26/2022, 10:36 AM

## 2022-09-27 LAB — GLUCOSE, CAPILLARY
Glucose-Capillary: 50 mg/dL — ABNORMAL LOW (ref 70–99)
Glucose-Capillary: 122 mg/dL — ABNORMAL HIGH (ref 70–99)
Glucose-Capillary: 102 mg/dL — ABNORMAL HIGH (ref 70–99)
Glucose-Capillary: 107 mg/dL — ABNORMAL HIGH (ref 70–99)
Glucose-Capillary: 210 mg/dL — ABNORMAL HIGH (ref 70–99)

## 2022-09-27 MED ORDER — CALCIUM CARBONATE 1250 (500 CA) MG PO TABS
1.0000 | ORAL_TABLET | Freq: Every day | ORAL | Status: DC
  Administered 2022-09-27 – 2022-10-07 (×11): 1250 mg via ORAL
  Filled 2022-09-27 (×11): qty 1

## 2022-09-27 MED ORDER — MELATONIN 5 MG PO TABS
5.0000 mg | ORAL_TABLET | Freq: Every day | ORAL | Status: DC
  Administered 2022-09-27 – 2022-10-02 (×6): 5 mg via ORAL
  Filled 2022-09-27 (×6): qty 1

## 2022-09-27 NOTE — Plan of Care (Signed)
  Problem: Consults Goal: RH GENERAL PATIENT EDUCATION Description: See Patient Education module for education specifics. Outcome: Progressing   Problem: RH BOWEL ELIMINATION Goal: RH STG MANAGE BOWEL WITH ASSISTANCE Description: STG Manage Bowel with mod I Assistance. Outcome: Progressing Goal: RH STG MANAGE BOWEL W/MEDICATION W/ASSISTANCE Description: STG Manage Bowel with Medication with mod I Assistance. Outcome: Progressing   Problem: RH SKIN INTEGRITY Goal: RH STG SKIN FREE OF INFECTION/BREAKDOWN Description: Manage with cues Outcome: Progressing   Problem: RH SAFETY Goal: RH STG ADHERE TO SAFETY PRECAUTIONS W/ASSISTANCE/DEVICE Description: STG Adhere to Safety Precautions With cues Assistance/Device. Outcome: Progressing   Problem: RH PAIN MANAGEMENT Goal: RH STG PAIN MANAGED AT OR BELOW PT'S PAIN GOAL Description: < 4 with prns Outcome: Progressing   Problem: RH KNOWLEDGE DEFICIT GENERAL Goal: RH STG INCREASE KNOWLEDGE OF SELF CARE AFTER HOSPITALIZATION Description: Patient will be able to manage care and direct others to assist using educational resources independently Outcome: Progressing

## 2022-09-27 NOTE — Progress Notes (Signed)
Hypoglycemic Event  CBG: 50 at 0716  Treatment: 8 oz juice/soda  Symptoms:  Patient reported that she did not feel right.  Follow-up CBG: Time:0740 CBG Result:122  Possible Reasons for Event: Other: Patient had not received breakfast tray.  Comments/MD notified:Charge nurse present.    Rodney Yera L Zakai Gonyea, R.N.

## 2022-09-27 NOTE — Progress Notes (Signed)
Physical Therapy Session Note  Patient Details  Name: Diana Quinn MRN: [id number] Date of Birth: 08/05/1965  Today's Date: 09/27/2022 PT Individual Time: 0755-0850 PT Individual Time Calculation (min): 55 min   Short Term Goals: Week 1:  PT Short Term Goal 1 (Week 1): Pt will completed bed mobility with S PT Short Term Goal 2 (Week 1): Pt will completed slide transfer with S + slideboard PT Short Term Goal 3 (Week 1): Pt will complete WC propulsion with S PT Short Term Goal 4 (Week 1): Pt will complete car transfer with CGA + LRAD PT Short Term Goal 5 (Week 1): Pt will initiate stair training  Skilled Therapeutic Interventions/Progress Updates:    Chart reviewed and pt agreeable to therapy. Pt received semi-reclined in bed with 6/10 c/o pain. Session focused on functional transfers to promote safe home access. Pt initiated session with donning CAM boot with set up A. Pt then transferred to EOB from flat surface using S and no bedrails. Pt then transferred to [facility] with set up A + S for scoot transfer. Pt then required minA VC for management of leg rest and arm rest. Pt then completed 2100ft WC propulsion with S. Pt then completed blocked practiced sit to stand with pt attempting different hand positions and concluding RUE on RW + LUE on armrest as most supportive for sit to stand with CGA + RW. Pt then completed SSTT x2 to bed with scoot return to [facility]. Pt required CGA  RW + VC for safety with SSTT. Pt then compelted 2x10 WC push ups for BUE strengthening in preparation for stair navigation.Pt then completed scoot transfer to bed and return to semi-reclined with S and doffed CAM boot with S.  Session education emphasized WC management of leg rests. At end of session, pt was left semi-reclined in bed with alarm engaged, nurse call bell and all needs in reach.     Therapy Documentation Precautions:  Precautions Precautions: Fall Restrictions Weight Bearing Restrictions: Yes RLE Weight Bearing:  Partial weight bearing (with cam boot) RLE Partial Weight Bearing Percentage or Pounds: minimal with transfers only LLE Weight Bearing: Non weight bearing General:    09/27/2022, 8:53 AM

## 2022-09-27 NOTE — Progress Notes (Signed)
PROGRESS NOTE   Subjective/Complaints:  Pt doing alright today, slept poorly due to not having Belsomra (only has a couple doses at home, d/t insurance issues). Willing to try melatonin, was unaware she had to ask for it, now made it scheduled.  Pain fairly well controlled.  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 Urinating well.  Reports she's supposed to be taking Vitamin D and calcium but her new PCP has adjusted things lately; hasn't had them in a bit.  Denies any other complaints or concerns today.   ROS: as per HPI. Denies CP, SOB, abd pain, N/V/D, or any other complaints at this time.    Objective:   VAS [place] LOWER EXTREMITY VENOUS (DVT)  Result Date: 09/26/2022  Lower Venous DVT Study Patient Name:  Diana Quinn  Date of Exam:   09/26/2022 Medical Rec #: [id number]      Accession #:    [id number] Date of Birth: [birth year]      Patient Gender: F Patient Age:   57 years Exam Location:  [facility] Procedure:      VAS [place] LOWER EXTREMITY VENOUS (DVT) Referring Phys: Mariam Dollar --------------------------------------------------------------------------------  Indications: Swelling.  Risk Factors: None identified. Limitations: Poor ultrasound/tissue interface and Left lower leg cast. Comparison Study: No prior studies. Performing Technologist: Chanda Busing RVT  Examination Guidelines: A complete evaluation includes B-mode imaging, spectral Doppler, color Doppler, and power Doppler as needed of all accessible portions of each vessel. Bilateral testing is considered an integral part of a complete examination. Limited examinations for reoccurring indications may be performed as noted. The reflux portion of the exam is performed with the patient in reverse Trendelenburg.  +---------+---------------+---------+-----------+----------+--------------+ RIGHT     CompressibilityPhasicitySpontaneityPropertiesThrombus Aging +---------+---------------+---------+-----------+----------+--------------+ CFV      Full           Yes      Yes                                 +---------+---------------+---------+-----------+----------+--------------+ SFJ      Full                                                        +---------+---------------+---------+-----------+----------+--------------+ FV Prox  Full                                                        +---------+---------------+---------+-----------+----------+--------------+ FV Mid   Full                                                        +---------+---------------+---------+-----------+----------+--------------+  FV DistalFull                                                        +---------+---------------+---------+-----------+----------+--------------+ PFV      Full                                                        +---------+---------------+---------+-----------+----------+--------------+ POP      Full           Yes      Yes                                 +---------+---------------+---------+-----------+----------+--------------+ PTV      Full                                                        +---------+---------------+---------+-----------+----------+--------------+ PERO     Full                                                        +---------+---------------+---------+-----------+----------+--------------+   +---------+---------------+---------+-----------+----------+-------------------+ LEFT     CompressibilityPhasicitySpontaneityPropertiesThrombus Aging      +---------+---------------+---------+-----------+----------+-------------------+ CFV      Full           Yes      Yes                                      +---------+---------------+---------+-----------+----------+-------------------+ SFJ      Full                                                              +---------+---------------+---------+-----------+----------+-------------------+ FV Prox  Full                                                             +---------+---------------+---------+-----------+----------+-------------------+ FV Mid   Full                                                             +---------+---------------+---------+-----------+----------+-------------------+ FV DistalFull                                                             +---------+---------------+---------+-----------+----------+-------------------+  PFV      Full                                                             +---------+---------------+---------+-----------+----------+-------------------+ POP      Full           Yes      Yes                                      +---------+---------------+---------+-----------+----------+-------------------+ PTV                                                   Not well visualized +---------+---------------+---------+-----------+----------+-------------------+ PERO                                                  Not well visualized +---------+---------------+---------+-----------+----------+-------------------+     Summary: RIGHT: - There is no evidence of deep vein thrombosis in the lower extremity.  - No cystic structure found in the popliteal fossa.  LEFT: - There is no evidence of deep vein thrombosis in the lower extremity. However, portions of this examination were limited- see technologist comments above.  - No cystic structure found in the popliteal fossa.  *See table(s) above for measurements and observations. Electronically signed by Sherald Hess MD on 09/26/2022 at 2:13:18 PM.    Final    Recent Labs    09/25/22 0330 09/26/22 0500  WBC 8.1 7.4  HGB 9.7* 11.5*  HCT 29.5* 34.9*  PLT 552* 587*   Recent Labs    09/25/22 0330 09/26/22 0500  NA 135 133*  K 3.6  4.5  CL 103 101  CO2 23 23  GLUCOSE 138* 92  BUN 13 11  CREATININE 0.63 0.71  CALCIUM 8.1* 8.5*    Intake/Output Summary (Last 24 hours) at 09/27/2022 1539 Last data filed at 09/27/2022 1329 Gross per 24 hour  Intake 956 ml  Output 650 ml  Net 306 ml        Physical Exam: Vital Signs Blood pressure 122/71, pulse 89, temperature 98.2 F (36.8 C), temperature source Oral, resp. rate 18, height 5\' 3"  (1.6 m), weight 90.7 kg, SpO2 100%.   General: Obese, No apparent distress HEENT: Head is normocephalic, atraumatic, MMM, poor dentitia Neck: Supple without JVD Heart: Reg rate and rhythm. No murmurs rubs or gallops Chest: CTA bilaterally without wheezes, rales, or rhonchi; no distress Abdomen: Soft, non-tender, non-distended, bowel sounds positive. Extremities: Right lower extremity ace wrapped with Cam walker at bedside, left lower extremity short leg cast in place Psych: Pt's affect is appropriate. Pt is cooperative Skin: Clean and intact without signs of breakdown  PRIOR EXAMS: Neuro: alert and awake, follows commands, cranial nerves II through XII grossly intact Strength 5 out of 5 in bilateral upper extremities Able to lift bilateral lower extremities to gravity with hip flexion and knee extension Able to wiggle her toes bilaterally  Assessment/Plan: 1. Functional deficits  which require 3+ hours per day of interdisciplinary therapy in a comprehensive inpatient rehab setting. Physiatrist is providing close team supervision and 24 hour management of active medical problems listed below. Physiatrist and rehab team continue to assess barriers to discharge/monitor patient progress toward functional and medical goals  Care Tool:  Bathing    Body parts bathed by patient: Right arm, Left arm, Chest, Abdomen, Front perineal area, Buttocks, Right upper leg, Left upper leg, Face   Body parts bathed by helper: Right lower leg Body parts n/a: Left lower leg   Bathing assist  Assist Level: Moderate Assistance - Patient 50 - 74%     Upper Body Dressing/Undressing Upper body dressing   What is the patient wearing?: Pull over shirt    Upper body assist Assist Level: Set up assist    Lower Body Dressing/Undressing Lower body dressing      What is the patient wearing?: Pants     Lower body assist Assist for lower body dressing: Moderate Assistance - Patient 50 - 74%     Toileting Toileting    Toileting assist Assist for toileting: Maximal Assistance - Patient 25 - 49%     Transfers Chair/bed transfer  Transfers assist  Chair/bed transfer activity did not occur: Safety/medical concerns  Chair/bed transfer assist level: Contact Guard/Touching assist     Locomotion Ambulation   Ambulation assist   Ambulation activity did not occur: Safety/medical concerns (2/2 high pain and BLE WB restrictions)          Walk 10 feet activity   Assist  Walk 10 feet activity did not occur: Safety/medical concerns (2/2 high pain and BLE WB restrictions)        Walk 50 feet activity   Assist Walk 50 feet with 2 turns activity did not occur: Safety/medical concerns (2/2 high pain and BLE WB restrictions)         Walk 150 feet activity   Assist Walk 150 feet activity did not occur: Safety/medical concerns (2/2 high pain and BLE WB restrictions)         Walk 10 feet on uneven surface  activity   Assist Walk 10 feet on uneven surfaces activity did not occur: Safety/medical concerns (2/2 high pain and BLE WB restrictions)         Wheelchair     Assist Is the patient using a wheelchair?: Yes Type of Wheelchair: Manual    Wheelchair assist level: Supervision/Verbal cueing Max wheelchair distance: 158ft    Wheelchair 50 feet with 2 turns activity    Assist        Assist Level: Supervision/Verbal cueing   Wheelchair 150 feet activity     Assist      Assist Level: Supervision/Verbal cueing   Blood pressure  122/71, pulse 89, temperature 98.2 F (36.8 C), temperature source Oral, resp. rate 18, height 5\' 3"  (1.6 m), weight 90.7 kg, SpO2 100%.  Medical Problem List and Plan: 1. Functional deficits secondary to left distal tibia fracture nondisplaced and right nondisplaced ankle fracture.  Followed by podiatry services Dr. Nicholes Rough.  By latest note 09/21/2022 podiatry services minimal weightbearing right lower extremity in cam walker.  Bathroom and transition purposes only.  Nonweightbearing left lower extremity with below-knee fiberglass cast applied             -patient may shower -ELOS/Goals: modified independent PT, modified independent OT, 10-12 days - NWB LLE 4-6 weeks, minimal WB RLE in CAM walker w/ walker (Bathroom and transition purposes only)  per podiatry; will need to clarify with Podiatry duration for RLE restrictions  - patient lives in a trailer and endorses doorways too small for wheelchair; supervision available but may not have physical assistance. Will need to discuss goals with team in AM.    -Continue CIR   2.  Antithrombotics: -DVT/anticoagulation:  Pharmaceutical: Lovenox 40mg  QD, DVT U/S neg but limited             -antiplatelet therapy: N/A 3. Pain Management: Zanaflex 4 mg 3 times daily, oxycodone/Advil/tylenol as needed  -7/19 continue oxycodone for lower extremity pain 4. Mood/Behavior/Sleep: History of PTSD anxiety with schizophrenia.  Continue Caplyta 42mg  QD, Paxil 40 mg daily, trazodone 300 mg nightly, Atarax as needed             - antipsychotic agents: Seroquel 300 mg nightly - takes Belsomra 20 mg at bedtime at home; not on formulary, would discuss with patient bringing from home  -7/19 patient reports she slept better last night however she would like to restart Belsomra.  Will see if patient can have her family member bring in her medication for use in the hospital.  She reports chronic insomnia -09/27/22 pt reports not having many doses of Belsomra-- wants to try  melatonin 5mg , scheduled nightly rather than PRN- updated order 5. Neuropsych/cognition: This patient is capable of making decisions on her own behalf. 6. Skin/Wound Care: Xeroform over left heel wound 7. Fluids/Electrolytes/Nutrition: Routine and analysis with follow-up chemistries 8.  History of seizure disorder.  Trileptal 600 mg twice daily 9.  History of unstable angina.  Ranexa 1000 mg twice daily, Minipress 2 mg nightly 10.  Hypothyroidism.  Synthroid QD 11.  Hyperlipidemia.  Lipitor 40mg  QD 12.  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  Follow-up dietary 13.  History of alcohol use.  Counseling. Patient denies currently.  14.  Anemia of chronic disease.  Continue iron supplement.  Follow-up CBC  -7/19 Hgb improved to 11.5 continue to monitor 15.  Thrombocytosis.  Likely reactive, recheck Monday 16.  Hyponatremia mild.  Appears to be occurring intermittently on chronic basis, recheck Monday 17. DM2 with hypoglycemia: ?previously on Acarbose 100mg  TID (unclear if patient was taking this) -09/27/22 CBGs variable and episode of hypoglycemia today; continue CBG monitoring and SSI, monitor  CBG (last 3)  Recent Labs    09/27/22 0716 09/27/22 0740 09/27/22 1134  GLUCAP 50* 122* 102*    18. Hx of vitamin D deficiency and hypocalcemia, now with pathologic fx: likely needs DEXA scan, appears that last Vit D level was in 2020 after appropriate replacement therapy, and then was on some form of Calcium-Vit D supplement (500mg  [1250mg ]-200IU dosing?) but had come off it at some point. Calcium low at 8.5 here at admission. Will recheck Vitamin D with Monday labs on 09/29/22, and start on calcium carbonate supplementation 1250mg  every day starting today 7/20, suspect she'll need some form of vitamin D supplementation (daily vs weekly).    LOS: 2 days A FACE TO FACE EVALUATION WAS PERFORMED  [id number] 09/27/2022, 3:39 PM

## 2022-09-27 NOTE — IPOC Note (Signed)
Overall Plan of Care [facility]) Patient Details Name: Diana Quinn MRN: [id number] DOB: 08/28/1965  Admitting Diagnosis: Bilateral ankle fractures  Hospital Problems: Principal Problem:   Bilateral ankle fractures     Functional Problem List: Nursing Pain, Bowel, Endurance, Medication Management, Skin Integrity, Safety  PT Balance, Endurance, Motor, Pain, Safety  OT Balance, Endurance, Motor, Sensory, Pain  SLP    TR         Basic ADL's: OT Grooming, Bathing, Dressing, Toileting     Advanced  ADL's: OT Simple Meal Preparation, Light Housekeeping, Laundry     Transfers: PT Bed Mobility, Bed to Chair, Customer service manager, Research scientist (life sciences): PT Psychologist, prison and probation services, Stairs     Additional Impairments: OT Fuctional Use of Upper Extremity  SLP        TR      Anticipated Outcomes Item Anticipated Outcome  Self Feeding indep  Swallowing      Basic self-care  mod I UB, Supervision LB with DME  Toileting  Supervision   Bathroom Transfers S with TTB and DABSC  Bowel/Bladder  manage bowel w mod I assist  Transfers  Supervision  Locomotion  Independent with WC, Supervision with stairs (boosting up stairs)  Communication     Cognition     Pain  < 4 with prns  Safety/Judgment  manage w cues   Therapy Plan: PT Intensity: Minimum of 1-2 x/day ,45 to 90 minutes PT Frequency: 5 out of 7 days PT Duration Estimated Length of Stay: 10-12 days OT Intensity: Minimum of 1-2 x/day, 45 to 90 minutes OT Frequency: 5 out of 7 days OT Duration/Estimated Length of Stay: 10-12 days     Team Interventions: Nursing Interventions Disease Management/Prevention, Medication Management, Discharge Planning, Pain Management, Bowel Management, Patient/Family Education, Psychosocial Support  PT interventions Warden/ranger, Discharge planning, Community reintegration, Disease management/prevention, DME/adaptive equipment instruction, Functional mobility training, Pain  management, Patient/family education, Stair training, Therapeutic Activities, Therapeutic Exercise, UE/LE Strength taining/ROM, UE/LE Coordination activities, Wheelchair propulsion/positioning  OT Interventions Warden/ranger, Cognitive remediation/compensation, Firefighter, Discharge planning, Disease mangement/prevention, DME/adaptive equipment instruction, Functional electrical stimulation, Functional mobility training, Neuromuscular re-education, Pain management, Patient/family education, Psychosocial support, Self Care/advanced ADL retraining, Skin care/wound managment, Splinting/orthotics, Therapeutic Activities, Therapeutic Exercise, UE/LE Strength taining/ROM, UE/LE Coordination activities, Visual/perceptual remediation/compensation, Wheelchair propulsion/positioning  SLP Interventions    TR Interventions    SW/CM Interventions Discharge Planning, Psychosocial Support, Patient/Family Education   Barriers to Discharge MD  Medical stability and Weight bearing restrictions  Nursing Decreased caregiver support, Home environment access/layout, Weight bearing restrictions 1 level 3/6 ste bil rails w son/DIL; son works second shift, DIL at home; supervise only (previous CVA) Pt ambulatory without AD, notes 4-5 falls in the past 6 months.sleeping on air mattress  PT Inaccessible home environment, Weight bearing restrictions 7STE; pt has strong WB restrictions  OT Inaccessible home environment, Decreased caregiver support, Home environment access/layout, Weight bearing restrictions WB restrictions, steps to enter home, limited cg avail as son works 2nd shift and dIL just recently had a CVA  SLP      SW Inaccessible home environment, Weight bearing restrictions, Decreased caregiver support     Team Discharge Planning: Destination: PT-Home ,OT- Home , SLP-  Projected Follow-up: PT-Home health PT, OT-  24 hour supervision/assistance, SLP-  Projected Equipment Needs:  PT-Wheelchair cushion (measurements), Wheelchair (measurements), Sliding board, To be determined, Rolling walker with 5" wheels, OT- Sliding board, Rolling walker with 5" wheels, Tub/shower bench, Wheelchair (measurements), Wheelchair cushion (measurements),  3 in 1 bedside comode, SLP-  Equipment Details: PT-pt measuring home for WC access, OT-DABSC, w/c, cushion, TB, RW and may need hospital bed Patient/family involved in discharge planning: PT- Patient,  OT-Patient, SLP-   MD ELOS: 10-12 Medical Rehab Prognosis:  Excellent Assessment: The patient has been admitted for CIR therapies with the diagnosis of left distal tibia fracture nondisplaced and right nondisplaced ankle fracture . The team will be addressing functional mobility, strength, stamina, balance, safety, adaptive techniques and equipment, self-care, bowel and bladder mgt, patient and caregiver education. Goals have been set at Mod I. Anticipated discharge destination is home.        See Team Conference Notes for weekly updates to the plan of care

## 2022-09-28 DIAGNOSIS — R0781 Pleurodynia: Secondary | ICD-10-CM

## 2022-09-28 LAB — GLUCOSE, CAPILLARY
Glucose-Capillary: 96 mg/dL (ref 70–99)
Glucose-Capillary: 183 mg/dL — ABNORMAL HIGH (ref 70–99)
Glucose-Capillary: 148 mg/dL — ABNORMAL HIGH (ref 70–99)
Glucose-Capillary: 84 mg/dL (ref 70–99)

## 2022-09-28 MED ORDER — LIDOCAINE 5 % EX PTCH
1.0000 | MEDICATED_PATCH | Freq: Every day | CUTANEOUS | Status: DC | PRN
  Administered 2022-09-28 – 2022-10-04 (×6): 1 via TRANSDERMAL
  Filled 2022-09-28 (×6): qty 1

## 2022-09-28 NOTE — Progress Notes (Signed)
Physical Therapy Session Note  Patient Details  Name: Diana Quinn MRN: [id number] Date of Birth: May 24, 1965  Today's Date: 09/28/2022 PT Individual Time: 4098-1191 PT Individual Time Calculation (min): 31 min   Short Term Goals: Week 1:  PT Short Term Goal 1 (Week 1): Pt will completed bed mobility with S PT Short Term Goal 2 (Week 1): Pt will completed slide transfer with S + slideboard PT Short Term Goal 3 (Week 1): Pt will complete WC propulsion with S PT Short Term Goal 4 (Week 1): Pt will complete car transfer with CGA + LRAD PT Short Term Goal 5 (Week 1): Pt will initiate stair training  Skilled Therapeutic Interventions/Progress Updates:    Pt presents in room in bed, agreeable to PT. Pt reporting no pain in sternum at rest however does report some movements reproduce pain such as transfers and reaching across body. Session focused on WC mobility for endurance and therepeutic exercise with stretching to reduce pain and discomfort in sternum.  Pt completes bed mobility supervision, set up assist for donning CAM boot. Pt completes lateral transfer with CGA for stabilizing WC, reports no pain with scooting laterally on bed but does report pain with lateral transfer.  Pt self propels around unit ~500', slow propulsion speed, does not report pain with WC propulsion as long as she does not extend shoulders too far while reaching back for wheel. Activity completed to promote UE muscular endurance.  Pt returns to room and positions next to bed, education on WC leg rest mgmt provided with pt able to take off leg rests with min assist. Pt able to position WC for transfer well without verbal cues. Pt completes lateral transfer CGA for holding WC and returns to supine with supervision.  Remainder of session focused on therapeutic stretching for BUEs to provide pain relief for pectoralis musculature including horizontal abduction 3x60 seconds and shoulder ER 3x60. Pt provided with education on  muscle strains and modalities to decrease pain with pt verbalizing understanding.  Pt remains supine with all needs within reach, call light in place, and bed alarm activated at end of session.  Therapy Documentation Precautions:  Precautions Precautions: Fall Restrictions Weight Bearing Restrictions: Yes RLE Weight Bearing: Partial weight bearing RLE Partial Weight Bearing Percentage or Pounds: minimal with transfers only LLE Weight Bearing: Non weight bearing    Therapy/Group: Individual Therapy  Edwin Cap PT, DPT 09/28/2022, 1:37 PM

## 2022-09-28 NOTE — Progress Notes (Signed)
Occupational Therapy Session Note  Patient Details  Name: Diana Quinn MRN: [id number] Date of Birth: 02/26/1966  Today's Date: 09/28/2022 OT Individual Time: 0700-0810 OT Individual Time Calculation (min): 70 min   Today's Date: 09/28/2022 OT Individual Time: 1120-1200 OT Individual Time Calculation (min): 40 min   Short Term Goals: Week 1:  OT Short Term Goal 1 (Week 1): pt will complete TTB and DABSC transfers with CGA OT Short Term Goal 2 (Week 1): pt will complete LB dressing with AE with set up OT Short Term Goal 3 (Week 1): Pt will perform ADL set up using w/c to gather items with dist s  Skilled Therapeutic Interventions/Progress Updates:  Session 1: Pt received resting in bed for skilled OT session with focus on BADL retraining. Pt agreeable to interventions, demonstrating overall pleasant mood. Pt with un-rated pain, stating "I'm working towards not needing it. . .  " in reference to pain management. OT offering intermediate rest breaks and positioning suggestions throughout session to address pain/fatigue and maximize participation/safety in session.   Pt dependent for CAM boot management and water proofing of L cast for time management. Pt comes to EOB, performing EOB<>WC and WC<>TTB lateral/scoot transfers with CGA-SUP. Pt completes full-body bathing at shower level with no more than supervision provided, using lateral lean method for posterior peri-hygiene. Pt performs UB dressing/bathing with setup, once again using lateral leans for LB dressing.   Sitting at sink-side, pt completes grooming with distant supervision. Time dedicated to discussing home setup/layout. Pt unfortunately presenting with multiple barriers in regards to household accessibility.   Pt remained resting in bed with all immediate needs met at end of session. Pt continues to be appropriate for skilled OT intervention to promote further functional independence.   Session 2: Pt received sitting in [facility] for  skilled OT session with focus on functional transfers and activity tolerance. Pt agreeable to interventions, demonstrating overall pleasant mood. Pt with un-rated pain at chest, stemming from previous therapy session. OT offering intermediate rest breaks and positioning suggestions throughout session to address pain/fatigue and maximize participation/safety in session.  Time dedicated to assessing movements which increased chest pain. Flexion and abduction noted, as well as, WC propulsion. Treatment team updated. Session tailored accordingly.   Pt educated on removal of leg rests. Dependent for WC transport to/from day room for pain management. In day room, pt participates in standing/activity tolerance activity at table-top to simulate skills needed for LB dressing. Pt tolerates trials ~1 min of standing with CGA + RW with unilateral support, no increase in pain. Plan to clarify orders with MD for further standing ADLs.   Pt remained resting in bed with all immediate needs met at end of session. Pt continues to be appropriate for skilled OT intervention to promote further functional independence.   Therapy Documentation Precautions:  Precautions Precautions: Fall Restrictions Weight Bearing Restrictions: Yes RLE Weight Bearing: Partial weight bearing (with cam boot) RLE Partial Weight Bearing Percentage or Pounds: minimal with transfers only LLE Weight Bearing: Non weight bearing   Therapy/Group: Individual Therapy  Lou Cal, OTR/L, MSOT  09/28/2022, 5:22 AM

## 2022-09-28 NOTE — Progress Notes (Addendum)
Physical Therapy Session Note  Patient Details  Name: Diana Quinn MRN: [id number] Date of Birth: December 13, 1965  Today's Date: 09/28/2022 PT Individual Time: 8295-6213 PT Individual Time Calculation (min): 57 min   Short Term Goals: Week 1:  PT Short Term Goal 1 (Week 1): Pt will completed bed mobility with S PT Short Term Goal 2 (Week 1): Pt will completed slide transfer with S + slideboard PT Short Term Goal 3 (Week 1): Pt will complete WC propulsion with S PT Short Term Goal 4 (Week 1): Pt will complete car transfer with CGA + LRAD PT Short Term Goal 5 (Week 1): Pt will initiate stair training  Skilled Therapeutic Interventions/Progress Updates:    Pt presents in room in bed, reports some pain in L leg 4/10 but reports no need for intervention at this time. Pt agreeable to PT. Session focused on BLE/BUE strengthening needed for functional transfers and stair negotiation. Pt completes lateral transfers with CGA/close supervision throughout session.  Pt requires set up assist for donning R CAM boot in long sitting. Pt then completes bed mobility with supervision no hospital bed features. Pt completes lateral scoot transfer bed to WC. Pt educated on WC parts management with pt requiring min assist for leg rest management and verbal cues for armrest management. Pt self propels 48' to day room with supervision and decreased speed. Pt completes lateral transfer to mat and transitions to long sititng on mat.  Pt completes therex in long sitting, short sitting, and supine to promote BUE/BLE strengthening needed for functional transfers and boosting posteriorly including: - Long sitting tricep dips 6" step (therapist assist to maintain LLE NWB) x8 reps - pt reports feeling a pop in sternum with pain during exercise, exercise terminated and pt reports pain subsides with rest - long sitting straight leg raise 2x5 - long sitting mini sit ups 2x10 - supine SAQs 2x10 BLE - supine hip abduction 2x10  BLE - supine SLR 2x5 BLE - supine hip flexion 2x10 BLE - supine modified bridges (posterior knees on bolster maintaining SAQ) 2x10 - seated LAQs 2x10 BLE - seated marches 2x20 alternating BLE  Pt completes transfer back to [facility], reporting same pain in sternum with lateral transfer. RN notified at end of session with pt reporting dull ache but no need for medication intervention. Pt returned to room dependently in [facility] and remains seated with all needs within reach, call light in place at end of session.   Therapy Documentation Precautions:  Precautions Precautions: Fall Restrictions Weight Bearing Restrictions: Yes RLE Weight Bearing: Partial weight bearing RLE Partial Weight Bearing Percentage or Pounds: minimal with transfers only LLE Weight Bearing: Non weight bearing   Therapy/Group: Individual Therapy  Edwin Cap 09/28/2022, 12:26 PM

## 2022-09-28 NOTE — Progress Notes (Signed)
Physical assessment performed.  Pt is A&O x4. Continence x2.  Good range of motion in all extremities. Some pain with ROM on left leg.  Pain with plantar extension on right side.  Dx IBS, last BM 09/25/22. BS hypoactive on all quadrants. No pain on palpation.  Pain score of 5 in left ankle. Ibuprofen 600mg  administered.  Skin intact, dry, no tenting, no pitting edema.  Cast on left ankle. Lungs clear on all fields.  I agree with this student's documentation.

## 2022-09-28 NOTE — Progress Notes (Cosign Needed)
Physical assessment performed.  Pt is A&O x4. Continence x2.  Good range of motion is all extremities. Some pain with ROM on left leg.  Pain with plantar extension on right side.  Dx IBS, last BM 09/25/22. BS hypoactive on all quadrants. No pain on palpation.  Pain score of 5 in left ankle. Ibuprofen 600mg  administered.  Skin intact, dry, no tenting, no pitting edema.  Cast on left ankle. Lungs clear on all fields.

## 2022-09-28 NOTE — Progress Notes (Signed)
PROGRESS NOTE   Subjective/Complaints:  Pt doing alright today, slept a little better last night.  Pain fairly well controlled. During therapy this morning, she was using her arms to support her body and felt a slight pop in her sternum/ribs. Thinks it's a "pulled muscle". States this feels better now.  LBM 3 days ago which is normal for her, doesn't feel constipated, will let us know if she needs anything for BMs.  Urinating well.  Denies any other complaints or concerns today.   ROS: as per HPI. Denies CP, SOB, abd pain, N/V/D, or any other complaints at this time.    Objective:   VAS [place] LOWER EXTREMITY VENOUS (DVT)  Result Date: 09/26/2022  Lower Venous DVT Study Patient Name:  Diana Quinn  Date of Exam:   09/26/2022 Medical Rec #: [id number]      Accession #:    [id number] Date of Birth: 1966-02-18      Patient Gender: F Patient Age:   57 years Exam Location:  [facility] Procedure:      VAS [place] LOWER EXTREMITY VENOUS (DVT) Referring Phys: Mariam Dollar --------------------------------------------------------------------------------  Indications: Swelling.  Risk Factors: None identified. Limitations: Poor ultrasound/tissue interface and Left lower leg cast. Comparison Study: No prior studies. Performing Technologist: Chanda Busing RVT  Examination Guidelines: A complete evaluation includes B-mode imaging, spectral Doppler, color Doppler, and power Doppler as needed of all accessible portions of each vessel. Bilateral testing is considered an integral part of a complete examination. Limited examinations for reoccurring indications may be performed as noted. The reflux portion of the exam is performed with the patient in reverse Trendelenburg.  +---------+---------------+---------+-----------+----------+--------------+ RIGHT    CompressibilityPhasicitySpontaneityPropertiesThrombus Aging  +---------+---------------+---------+-----------+----------+--------------+ CFV      Full           Yes      Yes                                 +---------+---------------+---------+-----------+----------+--------------+ SFJ      Full                                                        +---------+---------------+---------+-----------+----------+--------------+ FV Prox  Full                                                        +---------+---------------+---------+-----------+----------+--------------+ FV Mid   Full                                                        +---------+---------------+---------+-----------+----------+--------------+ FV DistalFull                                                        +---------+---------------+---------+-----------+----------+--------------+  PFV      Full                                                        +---------+---------------+---------+-----------+----------+--------------+ POP      Full           Yes      Yes                                 +---------+---------------+---------+-----------+----------+--------------+ PTV      Full                                                        +---------+---------------+---------+-----------+----------+--------------+ PERO     Full                                                        +---------+---------------+---------+-----------+----------+--------------+   +---------+---------------+---------+-----------+----------+-------------------+ LEFT     CompressibilityPhasicitySpontaneityPropertiesThrombus Aging      +---------+---------------+---------+-----------+----------+-------------------+ CFV      Full           Yes      Yes                                      +---------+---------------+---------+-----------+----------+-------------------+ SFJ      Full                                                              +---------+---------------+---------+-----------+----------+-------------------+ FV Prox  Full                                                             +---------+---------------+---------+-----------+----------+-------------------+ FV Mid   Full                                                             +---------+---------------+---------+-----------+----------+-------------------+ FV DistalFull                                                             +---------+---------------+---------+-----------+----------+-------------------+ PFV      Full                                                             +---------+---------------+---------+-----------+----------+-------------------+  POP      Full           Yes      Yes                                      +---------+---------------+---------+-----------+----------+-------------------+ PTV                                                   Not well visualized +---------+---------------+---------+-----------+----------+-------------------+ PERO                                                  Not well visualized +---------+---------------+---------+-----------+----------+-------------------+     Summary: RIGHT: - There is no evidence of deep vein thrombosis in the lower extremity.  - No cystic structure found in the popliteal fossa.  LEFT: - There is no evidence of deep vein thrombosis in the lower extremity. However, portions of this examination were limited- see technologist comments above.  - No cystic structure found in the popliteal fossa.  *See table(s) above for measurements and observations. Electronically signed by Sherald Hess MD on 09/26/2022 at 2:13:18 PM.    Final    Recent Labs    09/26/22 0500  WBC 7.4  HGB 11.5*  HCT 34.9*  PLT 587*   Recent Labs    09/26/22 0500  NA 133*  K 4.5  CL 101  CO2 23  GLUCOSE 92  BUN 11  CREATININE 0.71  CALCIUM 8.5*    Intake/Output Summary  (Last 24 hours) at 09/28/2022 1155 Last data filed at 09/28/2022 0720 Gross per 24 hour  Intake 956 ml  Output 500 ml  Net 456 ml        Physical Exam: Vital Signs Blood pressure (!) 114/58, pulse 85, temperature 98.7 F (37.1 C), temperature source Oral, resp. rate 18, height 5\' 3"  (1.6 m), weight 90.7 kg, SpO2 100%.   General: Obese, No apparent distress, sitting up in w/c HEENT: Head is normocephalic, atraumatic, MMM, poor dentitia Neck: Supple without JVD Heart: Reg rate and rhythm. No murmurs rubs or gallops Chest: CTA bilaterally without wheezes, rales, or rhonchi; no distress. No focal area of bruising or significant tenderness to chest wall, no deformities.  Abdomen: Soft, non-tender, non-distended, bowel sounds positive although slightly hypoactive Extremities: Right lower extremity in Cam walker, left lower extremity short leg cast in place Psych: Pt's affect is appropriate. Pt is cooperative Skin: Clean and intact without signs of breakdown  PRIOR EXAMS: Neuro: alert and awake, follows commands, cranial nerves II through XII grossly intact Strength 5 out of 5 in bilateral upper extremities Able to lift bilateral lower extremities to gravity with hip flexion and knee extension Able to wiggle her toes bilaterally  Assessment/Plan: 1. Functional deficits which require 3+ hours per day of interdisciplinary therapy in a comprehensive inpatient rehab setting. Physiatrist is providing close team supervision and 24 hour management of active medical problems listed below. Physiatrist and rehab team continue to assess barriers to discharge/monitor patient progress toward functional and medical goals  Care Tool:  Bathing    Body parts bathed by patient: Right arm, Left arm, Chest,  Abdomen, Front perineal area, Buttocks, Right upper leg, Left upper leg, Face   Body parts bathed by helper: Right lower leg Body parts n/a: Left lower leg   Bathing assist Assist Level: Moderate  Assistance - Patient 50 - 74%     Upper Body Dressing/Undressing Upper body dressing   What is the patient wearing?: Pull over shirt    Upper body assist Assist Level: Set up assist    Lower Body Dressing/Undressing Lower body dressing      What is the patient wearing?: Pants     Lower body assist Assist for lower body dressing: Moderate Assistance - Patient 50 - 74%     Toileting Toileting    Toileting assist Assist for toileting: Maximal Assistance - Patient 25 - 49%     Transfers Chair/bed transfer  Transfers assist  Chair/bed transfer activity did not occur: Safety/medical concerns  Chair/bed transfer assist level: Contact Guard/Touching assist     Locomotion Ambulation   Ambulation assist   Ambulation activity did not occur: Safety/medical concerns (2/2 high pain and BLE WB restrictions)          Walk 10 feet activity   Assist  Walk 10 feet activity did not occur: Safety/medical concerns (2/2 high pain and BLE WB restrictions)        Walk 50 feet activity   Assist Walk 50 feet with 2 turns activity did not occur: Safety/medical concerns (2/2 high pain and BLE WB restrictions)         Walk 150 feet activity   Assist Walk 150 feet activity did not occur: Safety/medical concerns (2/2 high pain and BLE WB restrictions)         Walk 10 feet on uneven surface  activity   Assist Walk 10 feet on uneven surfaces activity did not occur: Safety/medical concerns (2/2 high pain and BLE WB restrictions)         Wheelchair     Assist Is the patient using a wheelchair?: Yes Type of Wheelchair: Manual    Wheelchair assist level: Supervision/Verbal cueing Max wheelchair distance: 184ft    Wheelchair 50 feet with 2 turns activity    Assist        Assist Level: Supervision/Verbal cueing   Wheelchair 150 feet activity     Assist      Assist Level: Supervision/Verbal cueing   Blood pressure (!) 114/58, pulse 85,  temperature 98.7 F (37.1 C), temperature source Oral, resp. rate 18, height 5\' 3"  (1.6 m), weight 90.7 kg, SpO2 100%.  Medical Problem List and Plan: 1. Functional deficits secondary to left distal tibia fracture nondisplaced and right nondisplaced ankle fracture.  Followed by podiatry services Dr. Nicholes Rough.  By latest note 09/21/2022 podiatry services minimal weightbearing right lower extremity in cam walker.  Bathroom and transition purposes only.  Nonweightbearing left lower extremity with below-knee fiberglass cast applied             -patient may shower -ELOS/Goals: modified independent PT, modified independent OT, 10-12 days - NWB LLE 4-6 weeks, minimal WB RLE in CAM walker w/ walker (Bathroom and transition purposes only) per podiatry; will need to clarify with Podiatry duration for RLE restrictions  - patient lives in a trailer and endorses doorways too small for wheelchair; supervision available but may not have physical assistance. Will need to discuss goals with team in AM.    -Continue CIR   2.  Antithrombotics: -DVT/anticoagulation:  Pharmaceutical: Lovenox 40mg  QD, DVT U/S neg but limited             -  antiplatelet therapy: N/A 3. Pain Management: Zanaflex 4 mg 3 times daily, oxycodone/Advil/tylenol as needed  -7/19 continue oxycodone for lower extremity pain 4. Mood/Behavior/Sleep: History of PTSD anxiety with schizophrenia.  Continue Caplyta 42mg  QD, Paxil 40 mg daily, trazodone 300 mg nightly, Atarax as needed             - antipsychotic agents: Seroquel 300 mg nightly - takes Belsomra 20 mg at bedtime at home; not on formulary, would discuss with patient bringing from home  -7/19 patient reports she slept better last night however she would like to restart Belsomra.  Will see if patient can have her family member bring in her medication for use in the hospital.  She reports chronic insomnia -09/27/22 pt reports not having many doses of Belsomra-- wants to try melatonin 5mg ,  scheduled nightly rather than PRN- updated order -09/28/22 slept better overnight 5. Neuropsych/cognition: This patient is capable of making decisions on her own behalf. 6. Skin/Wound Care: Xeroform over left heel wound 7. Fluids/Electrolytes/Nutrition: Routine and analysis with follow-up chemistries 8.  History of seizure disorder.  Trileptal 600 mg twice daily 9.  History of unstable angina.  Ranexa 1000 mg twice daily, Minipress 2 mg nightly 10.  Hypothyroidism.  Synthroid QD 11.  Hyperlipidemia.  Lipitor 40mg  QD 12.  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  Follow-up dietary 13.  History of alcohol use.  Counseling. Patient denies currently.  14.  Anemia of chronic disease.  Continue iron supplement.  Follow-up CBC  -7/19 Hgb improved to 11.5 continue to monitor 15.  Thrombocytosis.  Likely reactive, recheck Monday 16.  Hyponatremia mild.  Appears to be occurring intermittently on chronic basis, recheck Monday 17. DM2 with hypoglycemia: ?previously on Acarbose 100mg  TID (unclear if patient was taking this) -09/27/22 CBGs variable and episode of hypoglycemia today; continue CBG monitoring and SSI, monitor -09/28/22 CBGs widely variable, defer to weekday team regarding restarting of home meds.   CBG (last 3)  Recent Labs    09/27/22 1630 09/27/22 2102 09/28/22 0555  GLUCAP 107* 210* 96    18. Hx of vitamin D deficiency and hypocalcemia, now with pathologic fx: likely needs DEXA scan, appears that last Vit D level was in 2020 after appropriate replacement therapy, and then was on some form of Calcium-Vit D supplement (500mg  [1250mg ]-200IU dosing?) but had come off it at some point. Calcium low at 8.5 here at admission. Will recheck Vitamin D with Monday labs on 09/29/22, and start on calcium carbonate supplementation 1250mg  every day starting today 7/20, suspect she'll need some form of vitamin D supplementation (daily vs weekly).   19. Rib/sternum pain: hx of sternal fx  from fall per PT -09/28/22 felt a pop while supporting her weight, thinks it's a pulled muscle, feels better upon eval, no deformity or significant tenderness; doubt need for imaging or work up, likely muscular or rib related; will order kpad and lidoderm PRN. Monitor. Activity as tolerated.    LOS: 3 days A FACE TO FACE EVALUATION WAS PERFORMED  [address] 09/28/2022, 11:55 AM

## 2022-09-29 DIAGNOSIS — E1165 Type 2 diabetes mellitus with hyperglycemia: Secondary | ICD-10-CM

## 2022-09-29 LAB — BASIC METABOLIC PANEL
Anion gap: 5 (ref 5–15)
BUN: 11 mg/dL (ref 6–20)
CO2: 26 mmol/L (ref 22–32)
Calcium: 8 mg/dL — ABNORMAL LOW (ref 8.9–10.3)
Chloride: 99 mmol/L (ref 98–111)
Creatinine, Ser: 0.62 mg/dL (ref 0.44–1.00)
GFR, Estimated: >60 mL/min (ref >60)
Glucose, Bld: 64 mg/dL — ABNORMAL LOW (ref 70–99)
Potassium: 4.1 mmol/L (ref 3.5–5.1)
Sodium: 130 mmol/L — ABNORMAL LOW (ref 135–145)

## 2022-09-29 LAB — CBC
HCT: 30.9 % — ABNORMAL LOW (ref 36.0–46.0)
Hemoglobin: 9.9 g/dL — ABNORMAL LOW (ref 12.0–15.0)
MCH: 31.4 pg (ref 26.0–34.0)
MCHC: 32 g/dL (ref 30.0–36.0)
MCV: 98.1 fL (ref 80.0–100.0)
Platelets: 506 10*3/uL — ABNORMAL HIGH (ref 150–400)
RBC: 3.15 MIL/uL — ABNORMAL LOW (ref 3.87–5.11)
RDW: 14 % (ref 11.5–15.5)
WBC: 6.2 10*3/uL (ref 4.0–10.5)
nRBC: 0 % (ref 0.0–0.2)

## 2022-09-29 LAB — GLUCOSE, CAPILLARY
Glucose-Capillary: 73 mg/dL (ref 70–99)
Glucose-Capillary: 101 mg/dL — ABNORMAL HIGH (ref 70–99)
Glucose-Capillary: 90 mg/dL (ref 70–99)
Glucose-Capillary: 118 mg/dL — ABNORMAL HIGH (ref 70–99)

## 2022-09-29 LAB — VITAMIN D 25 HYDROXY (VIT D DEFICIENCY, FRACTURES): Vit D, 25-Hydroxy: 30.14 ng/mL (ref 30–100)

## 2022-09-29 MED ORDER — SODIUM CHLORIDE 1 G PO TABS
1.0000 g | ORAL_TABLET | Freq: Two times a day (BID) | ORAL | Status: DC
  Administered 2022-09-29 – 2022-10-01 (×4): 1 g via ORAL
  Filled 2022-09-29 (×4): qty 1

## 2022-09-29 NOTE — Progress Notes (Addendum)
Occupational Therapy Session Note  Patient Details  Name: Diana Quinn MRN: [id number] Date of Birth: 1965-11-01  Today's Date: 09/29/2022 OT Individual Time: 0800-0910 OT Individual Time Calculation (min): 70 min    Short Term Goals: Week 1:  OT Short Term Goal 1 (Week 1): pt will complete TTB and DABSC transfers with CGA OT Short Term Goal 2 (Week 1): pt will complete LB dressing with AE with set up OT Short Term Goal 3 (Week 1): Pt will perform ADL set up using w/c to gather items with dist s  Skilled Therapeutic Interventions/Progress Updates:  Pt received resting in bed for skilled OT session with focus on BADL participation and discharge planning. Pt agreeable to interventions, demonstrating overall pleasant mood. Pt with un-rated pain in LLE, medicated during session. OT offering intermediate rest breaks and positioning suggestions throughout session to address pain/fatigue and maximize participation/safety in session.   Pt performs all stand/squat pivot transfers with close supervision during session. At sink-side, pt completes UB bathing/dressing with distant supervision, leaning laterally to manage LB bathing/dressing at EOB. Pt stands to don pants over bottom/hips with CGA + RW. Pt self-propels from room<>day room with supervision.  In day room, pt participates in [facility] mobility activities, targeting object avoidance with use of furniture and obstacle course with cones. Pt with difficulty managing tight corners, requiring minimal cuing.   Time dedicated for discharge planning and problem solving home-layout.  Pt remained sitting at EOB with all immediate needs met at end of session. Pt continues to be appropriate for skilled OT intervention to promote further functional independence.   Therapy Documentation Precautions:  Precautions Precautions: Fall Restrictions Weight Bearing Restrictions: Yes RLE Weight Bearing: Partial weight bearing (with cam boot) RLE Partial Weight  Bearing Percentage or Pounds: minimal with transfers only LLE Weight Bearing: Non weight bearing   Therapy/Group: Individual Therapy  Lou Cal, OTR/L, MSOT  09/29/2022, 6:09 AM

## 2022-09-29 NOTE — Progress Notes (Signed)
Physical Therapy Session Note  Patient Details  Name: Diana Quinn MRN: [id number] Date of Birth: [birth year]  Today's Date: 09/29/2022 PT Individual Time: 1000-1058 PT Individual Time Calculation (min): 58 min   Short Term Goals: Week 1:  PT Short Term Goal 1 (Week 1): Pt will completed bed mobility with S PT Short Term Goal 2 (Week 1): Pt will completed slide transfer with S + slideboard PT Short Term Goal 3 (Week 1): Pt will complete WC propulsion with S PT Short Term Goal 4 (Week 1): Pt will complete car transfer with CGA + LRAD PT Short Term Goal 5 (Week 1): Pt will initiate stair training  Skilled Therapeutic Interventions/Progress Updates:      Pt supine in bed upon arrival. Pt agreeable to therapy. Pt reports 5/10 L LE pain, premedicated.   Supine to sit with mod I, donned cam boot with min A as pt expressed concern about sternal pain when donning cam boot.Pt performed lateral scoot hospital bed to [facility] with supervision/set up assist. PT followed up regarding sternal pain, pt reports previous sternal fracture (October 2023), did not require surgery, pt reports popping and sharp pain with scooting technique with stair navigation, pt reports sharp pain with shoulder extension with wheelchair mobility.  Discussed car and home set up.  pt reports she was sleeping on air mattress but plans to sleep on pt sons couch upon discharge.   Discussed pt stairs, pt has 7 steps with B HR (unable to reach both at same time. Pt reports she trailed scooting technique and used it at [facility] but had popping pain in chest when performing during yesterday session. Therapist recommended installing a ramp. Pt reports she lives with son in rental house and is unable to install a ramp, but reports her son can lift her in and out of house for her doctor's appointments. Therapist provided education and demonstration on shower chair technique. Pt performed lateral scoot transfer WC to shower chair with CGA to  stabilize shower chair, however pt unable to perform sit<>stand with B UE support on stair rail and mod-max A while maintaining L LE NWBing precautions.   Discussed pt car. Pt reports her only option right now is a SUV. Pt performed sit<>stand x5 with RW and CGA with unilateral UE on RW and unilateral UE support on WC arm rest, verbal cues provided for technique, pt able to maintain L LE NWBing throughout session.   Pt performed 1x7 heel raises with RW and CGA,to assist with stand pivot transfer for transfer into car,  pt reports intermittent sternal and ankle pain, however inconsistent. Pt reports her son will be able to lift her in and out of car when needed for appointment.   Pt performed the following therex to increase B LE strength, verbal and tactile cues provided to ensure performance within full range and to reduce compensation.   1x10 B LAQ  1x10 Seated alternating marching  1x10 R LE ankle pumps with cam boot  1x10 Glute bridge with B UE on bolster holding for 3 seconds at peak  1x10 SLR B   Pt donned and doffed leg rest and arm rest with supervision, verbal cues provided for technique, pt positioned WC next to bed for lateral scoot transfer with supervision,  Pt performed lateral scoot transfer WC to hospital bed and sit<.supine  independently.   Pt supine in bed  at end of session with all needs within reach and bed alarm on.  Therapy Documentation Precautions:  Precautions Precautions: Fall Restrictions Weight Bearing Restrictions: Yes RLE Weight Bearing: Partial weight bearing (with cam boot) RLE Partial Weight Bearing Percentage or Pounds: minimal with transfers only LLE Weight Bearing: Non weight bearing  Therapy/Group: Individual Therapy  [facility] [place], [place], DPT  09/29/2022, 10:00 AM

## 2022-09-29 NOTE — Progress Notes (Addendum)
PROGRESS NOTE   Subjective/Complaints:  Working with therapy in gym this AM. Continues to have LLE pain controlled with medications. No new concerns this AM. Reports her sternal/rib pain is much improved today.   ROS: as per HPI. Denies CP, SOB, abd pain, N/V/D, or any other complaints at this time.   +LLE pain  Objective:   No results found. Recent Labs    09/29/22 0700  WBC 6.2  HGB 9.9*  HCT 30.9*  PLT 506*   No results for input(s): "NA", "K", "CL", "CO2", "GLUCOSE", "BUN", "CREATININE", "CALCIUM" in the last 72 hours.   Intake/Output Summary (Last 24 hours) at 09/29/2022 0812 Last data filed at 09/29/2022 0725 Gross per 24 hour  Intake 716 ml  Output 1200 ml  Net -484 ml        Physical Exam: Vital Signs Blood pressure 114/64, pulse 82, temperature 97.8 F (36.6 C), temperature source Oral, resp. rate 16, height 5\' 3"  (1.6 m), weight 90.7 kg, SpO2 100%.   General: Obese, No apparent distress, working in gym HEENT: Head is normocephalic, atraumatic, MMM, poor dentitia Neck: Supple without JVD Heart: Reg rate and rhythm. No murmurs rubs or gallops Chest: CTA bilaterally without wheezes, rales, or rhonchi; no distress. No focal area of bruising or significant tenderness to chest wall, no deformities.  Abdomen: Soft, non-tender, non-distended, bowel sounds normoactive Extremities: Right lower extremity in Cam walker, left lower extremity short leg cast in place Psych: Pt's affect is appropriate. Pt is cooperative Skin: Clean and intact without signs of breakdown  PRIOR EXAMS: Neuro: alert and awake, follows commands, cranial nerves II through XII grossly intact Strength 5 out of 5 in bilateral upper extremities Able to lift bilateral lower extremities to gravity with hip flexion and knee extension Able to wiggle her toes bilaterally  Assessment/Plan: 1. Functional deficits which require 3+ hours per day  of interdisciplinary therapy in a comprehensive inpatient rehab setting. Physiatrist is providing close team supervision and 24 hour management of active medical problems listed below. Physiatrist and rehab team continue to assess barriers to discharge/monitor patient progress toward functional and medical goals  Care Tool:  Bathing    Body parts bathed by patient: Right arm, Left arm, Chest, Abdomen, Front perineal area, Buttocks, Right upper leg, Left upper leg, Face   Body parts bathed by helper: Right lower leg Body parts n/a: Left lower leg   Bathing assist Assist Level: Moderate Assistance - Patient 50 - 74%     Upper Body Dressing/Undressing Upper body dressing   What is the patient wearing?: Pull over shirt    Upper body assist Assist Level: Set up assist    Lower Body Dressing/Undressing Lower body dressing      What is the patient wearing?: Pants     Lower body assist Assist for lower body dressing: Moderate Assistance - Patient 50 - 74%     Toileting Toileting    Toileting assist Assist for toileting: Maximal Assistance - Patient 25 - 49%     Transfers Chair/bed transfer  Transfers assist  Chair/bed transfer activity did not occur: Safety/medical concerns  Chair/bed transfer assist level: Contact Guard/Touching assist  Locomotion Ambulation   Ambulation assist   Ambulation activity did not occur: Safety/medical concerns (2/2 high pain and BLE WB restrictions)          Walk 10 feet activity   Assist  Walk 10 feet activity did not occur: Safety/medical concerns (2/2 high pain and BLE WB restrictions)        Walk 50 feet activity   Assist Walk 50 feet with 2 turns activity did not occur: Safety/medical concerns (2/2 high pain and BLE WB restrictions)         Walk 150 feet activity   Assist Walk 150 feet activity did not occur: Safety/medical concerns (2/2 high pain and BLE WB restrictions)         Walk 10 feet on uneven  surface  activity   Assist Walk 10 feet on uneven surfaces activity did not occur: Safety/medical concerns (2/2 high pain and BLE WB restrictions)         Wheelchair     Assist Is the patient using a wheelchair?: Yes Type of Wheelchair: Manual    Wheelchair assist level: Supervision/Verbal cueing Max wheelchair distance: 141ft    Wheelchair 50 feet with 2 turns activity    Assist        Assist Level: Supervision/Verbal cueing   Wheelchair 150 feet activity     Assist      Assist Level: Supervision/Verbal cueing   Blood pressure 114/64, pulse 82, temperature 97.8 F (36.6 C), temperature source Oral, resp. rate 16, height 5\' 3"  (1.6 m), weight 90.7 kg, SpO2 100%.  Medical Problem List and Plan: 1. Functional deficits secondary to left distal tibia fracture nondisplaced and right nondisplaced ankle fracture.  Followed by podiatry services Dr. Nicholes Rough.  By latest note 09/21/2022 podiatry services minimal weightbearing right lower extremity in cam walker.  Bathroom and transition purposes only.  Nonweightbearing left lower extremity with below-knee fiberglass cast applied             -patient may shower -ELOS/Goals: modified independent PT, modified independent OT, 10-12 days - NWB LLE 4-6 weeks, minimal WB RLE in CAM walker w/ walker (Bathroom and transition purposes only) per podiatry; will need to clarify with Podiatry duration for RLE restrictions  - patient lives in a trailer and endorses doorways too small for wheelchair; supervision available but may not have physical assistance. Will need to discuss goals with team in AM.    -Continue CIR   2.  Antithrombotics: -DVT/anticoagulation:  Pharmaceutical: Lovenox 40mg  QD, DVT U/S neg but limited             -antiplatelet therapy: N/A 3. Pain Management: Zanaflex 4 mg 3 times daily, oxycodone/Advil/tylenol as needed  -7/19 continue oxycodone for lower extremity pain 4. Mood/Behavior/Sleep: History of PTSD  anxiety with schizophrenia.  Continue Caplyta 42mg  QD, Paxil 40 mg daily, trazodone 300 mg nightly, Atarax as needed             - antipsychotic agents: Seroquel 300 mg nightly - takes Belsomra 20 mg at bedtime at home; not on formulary, would discuss with patient bringing from home  -7/19 patient reports she slept better last night however she would like to restart Belsomra.  Will see if patient can have her family member bring in her medication for use in the hospital.  She reports chronic insomnia -09/27/22 pt reports not having many doses of Belsomra-- wants to try melatonin 5mg , scheduled nightly rather than PRN- updated order -09/28/22 slept better overnight 5. Neuropsych/cognition: This  patient is capable of making decisions on her own behalf. 6. Skin/Wound Care: Xeroform over left heel wound 7. Fluids/Electrolytes/Nutrition: Routine and analysis with follow-up chemistries 8.  History of seizure disorder.  Trileptal 600 mg twice daily 9.  History of unstable angina.  Ranexa 1000 mg twice daily, Minipress 2 mg nightly 10.  Hypothyroidism.  Synthroid QD 11.  Hyperlipidemia.  Lipitor 40mg  QD 12.  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  Follow-up dietary 13.  History of alcohol use.  Counseling. Patient denies currently.  14.  Anemia of chronic disease.  Continue iron supplement.  Follow-up CBC  -7/19 Hgb improved to 11.5 continue to monitor  7/22 overall stable hgb at 9.9, continue to follow with weekly labs 15.  Thrombocytosis.  Likely reactive, recheck Monday 16.  Hyponatremia mild.  Appears to be occurring intermittently on chronic basis, recheck Monday  -7/22 start salt tabs BID, recheck tomorrow 17. DM2 with hypoglycemia: ?previously on Acarbose 100mg  TID (unclear if patient was taking this) -09/27/22 CBGs variable and episode of hypoglycemia today; continue CBG monitoring and SSI, monitor -7/22 CBGS controlled today, continue to monitor for now  CBG (last 3)  Recent  Labs    09/28/22 1631 09/28/22 2128 09/29/22 0616  GLUCAP 148* 84 73    18. Hx of vitamin D deficiency and hypocalcemia, now with pathologic fx: likely needs DEXA scan, appears that last Vit D level was in 2020 after appropriate replacement therapy, and then was on some form of Calcium-Vit D supplement (500mg  [1250mg ]-200IU dosing?) but had come off it at some point. Calcium low at 8.5 here at admission. Will recheck Vitamin D with Monday labs on 09/29/22, and start on calcium carbonate supplementation 1250mg  every day starting today 7/20, suspect she'll need some form of vitamin D supplementation (daily vs weekly).   19. Rib/sternum pain: hx of sternal fx from fall per PT -09/28/22 felt a pop while supporting her weight, thinks it's a pulled muscle, feels better upon eval, no deformity or significant tenderness; doubt need for imaging or work up, likely muscular or rib related; will order kpad and lidoderm PRN. Monitor. Activity as tolerated.  -7/22 rib pain improved with interventions above    LOS: 4 days A FACE TO FACE EVALUATION WAS PERFORMED  Fanny Dance 09/29/2022, 8:12 AM

## 2022-09-29 NOTE — Progress Notes (Signed)
Occupational Therapy Session Note  Patient Details  Name: Diana Quinn MRN: [id number] Date of Birth: 1965/04/17  Today's Date: 09/29/2022 OT Individual Time: 8657-8469 OT Individual Time Calculation (min): 74 min    Short Term Goals: Week 1:  OT Short Term Goal 1 (Week 1): pt will complete TTB and DABSC transfers with CGA OT Short Term Goal 2 (Week 1): pt will complete LB dressing with AE with set up OT Short Term Goal 3 (Week 1): Pt will perform ADL set up using w/c to gather items with dist s  Skilled Therapeutic Interventions/Progress Updates:   Pt seen for final session of the day for OT. Pt open to outdoor activity and [employer] access as well as transfer training for community reentry, activity tolrance progression and overall well-being. Pt transported via w/c to and from 1st level of hospital due to energy conservation. Self propelled w/c 50 ft with S on flat surface x 4 intervals with rests in between. Pt then wheeled to inside [employer] for snack retrieval with min A for item transport and reaching as well as object negotiation.  Educated on energy conservation and breathing throughout session with fair to good teach back. Pt transferred lateral squat pivot to R side in and out of banquette with min a-CGA. OT transported pt back to room and pt transferred with CGA lateral squat transfer back to bed. Left pt with bed exit engaged, LE's elevated and all needs in reach.   Pain: 5/10 LE distal pain and sternal soreness from therex and using kpad and elevation of LE's for relief   Therapy Documentation Precautions:  Precautions Precautions: Fall Restrictions Weight Bearing Restrictions: Yes RLE Weight Bearing: Partial weight bearing RLE Partial Weight Bearing Percentage or Pounds: minimal with transfers only LLE Weight Bearing: Non weight bearing    Therapy/Group: Individual Therapy  Vicenta Dunning 09/29/2022, 2:15 PM

## 2022-09-30 ENCOUNTER — Inpatient Hospital Stay (HOSPITAL_COMMUNITY): Payer: MEDICAID

## 2022-09-30 LAB — BASIC METABOLIC PANEL
Anion gap: 8 (ref 5–15)
BUN: 13 mg/dL (ref 6–20)
CO2: 23 mmol/L (ref 22–32)
Calcium: 8.1 mg/dL — ABNORMAL LOW (ref 8.9–10.3)
Chloride: 97 mmol/L — ABNORMAL LOW (ref 98–111)
Creatinine, Ser: 0.71 mg/dL (ref 0.44–1.00)
GFR, Estimated: >60 mL/min (ref >60)
Glucose, Bld: 233 mg/dL — ABNORMAL HIGH (ref 70–99)
Potassium: 4.3 mmol/L (ref 3.5–5.1)
Sodium: 128 mmol/L — ABNORMAL LOW (ref 135–145)

## 2022-09-30 LAB — GLUCOSE, CAPILLARY
Glucose-Capillary: 92 mg/dL (ref 70–99)
Glucose-Capillary: 125 mg/dL — ABNORMAL HIGH (ref 70–99)
Glucose-Capillary: 104 mg/dL — ABNORMAL HIGH (ref 70–99)
Glucose-Capillary: 119 mg/dL — ABNORMAL HIGH (ref 70–99)

## 2022-09-30 MED ORDER — OXYCODONE HCL 5 MG PO TABS
10.0000 mg | ORAL_TABLET | ORAL | Status: DC | PRN
  Administered 2022-09-30 – 2022-10-07 (×29): 10 mg via ORAL
  Filled 2022-09-30 (×29): qty 2

## 2022-09-30 MED ORDER — VITAMIN D 25 MCG (1000 UNIT) PO TABS
1000.0000 [IU] | ORAL_TABLET | Freq: Every day | ORAL | Status: DC
  Administered 2022-09-30 – 2022-10-07 (×8): 1000 [IU] via ORAL
  Filled 2022-09-30 (×8): qty 1

## 2022-09-30 NOTE — Progress Notes (Signed)
PROGRESS NOTE   Subjective/Complaints:  Pt seen at bedside today. She has had continued sternal pain today worsened with activity. Lidocaine patch is helping. NO additional concerns.    ROS: as per HPI.  SOB, abd pain, N/V/D, or any other complaints at this time.   +LLE pain + chest wall soreness  Objective:   No results found. Recent Labs    09/29/22 0700  WBC 6.2  HGB 9.9*  HCT 30.9*  PLT 506*   Recent Labs    09/29/22 0700  NA 130*  K 4.1  CL 99  CO2 26  GLUCOSE 64*  BUN 11  CREATININE 0.62  CALCIUM 8.0*     Intake/Output Summary (Last 24 hours) at 09/30/2022 4098 Last data filed at 09/29/2022 2230 Gross per 24 hour  Intake 472 ml  Output 1050 ml  Net -578 ml        Physical Exam: Vital Signs Blood pressure 122/73, pulse 88, temperature 98.2 F (36.8 C), resp. rate 16, height 5\' 3"  (1.6 m), weight 90.7 kg, SpO2 93%.   General: Obese, No apparent distress, lying in bed HEENT: Head is normocephalic, atraumatic, MMM, poor dentition Neck: Supple without JVD Heart: Reg rate and rhythm. No murmurs rubs or gallops Chest: CTA bilaterally without wheezes, rales, or rhonchi; no distress. No focal area of bruising or significant tenderness to chest wall, no deformities.  + chest wall soreness replicating her pain, tender around sternum Abdomen: Soft, non-tender, non-distended, bowel sounds normoactive Extremities: Right lower extremity in Cam walker, left lower extremity short leg cast in place Psych: Pt's affect is appropriate. Pt is cooperative Skin: Clean and intact without signs of breakdown  PRIOR EXAMS: Neuro: alert and awake, follows commands, cranial nerves II through XII grossly intact Strength 5 out of 5 in bilateral upper extremities Able to lift bilateral lower extremities to gravity with hip flexion and knee extension Able to wiggle her toes bilaterally  Assessment/Plan: 1. Functional  deficits which require 3+ hours per day of interdisciplinary therapy in a comprehensive inpatient rehab setting. Physiatrist is providing close team supervision and 24 hour management of active medical problems listed below. Physiatrist and rehab team continue to assess barriers to discharge/monitor patient progress toward functional and medical goals  Care Tool:  Bathing    Body parts bathed by patient: Right arm, Left arm, Chest, Abdomen, Front perineal area, Buttocks, Right upper leg, Left upper leg, Face   Body parts bathed by helper: Right lower leg Body parts n/a: Left lower leg   Bathing assist Assist Level: Moderate Assistance - Patient 50 - 74%     Upper Body Dressing/Undressing Upper body dressing   What is the patient wearing?: Pull over shirt    Upper body assist Assist Level: Set up assist    Lower Body Dressing/Undressing Lower body dressing      What is the patient wearing?: Pants     Lower body assist Assist for lower body dressing: Moderate Assistance - Patient 50 - 74%     Toileting Toileting    Toileting assist Assist for toileting: Maximal Assistance - Patient 25 - 49%     Transfers Chair/bed transfer  Transfers assist  Chair/bed transfer activity did not occur: Safety/medical concerns  Chair/bed transfer assist level: Contact Guard/Touching assist     Locomotion Ambulation   Ambulation assist   Ambulation activity did not occur: Safety/medical concerns (2/2 high pain and BLE WB restrictions)          Walk 10 feet activity   Assist  Walk 10 feet activity did not occur: Safety/medical concerns (2/2 high pain and BLE WB restrictions)        Walk 50 feet activity   Assist Walk 50 feet with 2 turns activity did not occur: Safety/medical concerns (2/2 high pain and BLE WB restrictions)         Walk 150 feet activity   Assist Walk 150 feet activity did not occur: Safety/medical concerns (2/2 high pain and BLE WB  restrictions)         Walk 10 feet on uneven surface  activity   Assist Walk 10 feet on uneven surfaces activity did not occur: Safety/medical concerns (2/2 high pain and BLE WB restrictions)         Wheelchair     Assist Is the patient using a wheelchair?: Yes Type of Wheelchair: Manual    Wheelchair assist level: Supervision/Verbal cueing Max wheelchair distance: 164ft    Wheelchair 50 feet with 2 turns activity    Assist        Assist Level: Supervision/Verbal cueing   Wheelchair 150 feet activity     Assist      Assist Level: Supervision/Verbal cueing   Blood pressure 122/73, pulse 88, temperature 98.2 F (36.8 C), resp. rate 16, height 5\' 3"  (1.6 m), weight 90.7 kg, SpO2 93%.  Medical Problem List and Plan: 1. Functional deficits secondary to left distal tibia fracture nondisplaced and right nondisplaced ankle fracture.  Followed by podiatry services Dr. Nicholes Rough.  By latest note 09/21/2022 podiatry services minimal weightbearing right lower extremity in cam walker.  Bathroom and transition purposes only.  Nonweightbearing left lower extremity with below-knee fiberglass cast applied             -patient may shower -ELOS/Goals: modified independent PT, modified independent OT, 10-12 days - NWB LLE 4-6 weeks, minimal WB RLE in CAM walker w/ walker (Bathroom and transition purposes only) per podiatry; will need to clarify with Podiatry duration for RLE restrictions  - patient lives in a trailer and endorses doorways too small for wheelchair; supervision available but may not have physical assistance. Will need to discuss goals with team in AM.    -Continue CIR -Team conference tomorrow   2.  Antithrombotics: -DVT/anticoagulation:  Pharmaceutical: Lovenox 40mg  QD, DVT U/S neg but limited             -antiplatelet therapy: N/A 3. Pain Management: Zanaflex 4 mg 3 times daily, oxycodone/Advil/tylenol as needed  -7/19 continue oxycodone for lower  extremity pain 4. Mood/Behavior/Sleep: History of PTSD anxiety with schizophrenia.  Continue Caplyta 42mg  QD, Paxil 40 mg daily, trazodone 300 mg nightly, Atarax as needed             - antipsychotic agents: Seroquel 300 mg nightly - takes Belsomra 20 mg at bedtime at home; not on formulary, would discuss with patient bringing from home  -7/19 patient reports she slept better last night however she would like to restart Belsomra.  Will see if patient can have her family member bring in her medication for use in the hospital.  She reports chronic insomnia -09/27/22 pt reports not having many doses of  Belsomra-- wants to try melatonin 5mg , scheduled nightly rather than PRN- updated order -09/28/22 slept better overnight 5. Neuropsych/cognition: This patient is capable of making decisions on her own behalf. 6. Skin/Wound Care: Xeroform over left heel wound 7. Fluids/Electrolytes/Nutrition: Routine and analysis with follow-up chemistries 8.  History of seizure disorder.  Trileptal 600 mg twice daily 9.  History of unstable angina.  Ranexa 1000 mg twice daily, Minipress 2 mg nightly 10.  Hypothyroidism.  Synthroid QD 11.  Hyperlipidemia.  Lipitor 40mg  QD 12.  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  Follow-up dietary 13.  History of alcohol use.  Counseling. Patient denies currently.  14.  Anemia of chronic disease.  Continue iron supplement.  Follow-up CBC  -7/19 Hgb improved to 11.5 continue to monitor  7/22 overall stable hgb at 9.9, continue to follow with weekly labs 15.  Thrombocytosis.  Likely reactive, recheck Monday 16.  Hyponatremia mild.  Appears to be occurring intermittently on chronic basis, recheck Monday  -7/22 start salt tabs BID, recheck tomorrow  -7/23 fluid restriction, continue to monitor sodium level 17. DM2 with hypoglycemia: ?previously on Acarbose 100mg  TID (unclear if patient was taking this) -09/27/22 CBGs variable and episode of hypoglycemia today;  continue CBG monitoring and SSI, monitor -7/22-3 CBGS controlled today, continue to monitor for now  CBG (last 3)  Recent Labs    09/29/22 1643 09/29/22 2053 09/30/22 0555  GLUCAP 90 118* 92    18. Hx of vitamin D deficiency and hypocalcemia, now with pathologic fx: likely needs DEXA scan, appears that last Vit D level was in 2020 after appropriate replacement therapy, and then was on some form of Calcium-Vit D supplement (500mg  [1250mg ]-200IU dosing?) but had come off it at some point. Calcium low at 8.5 here at admission. Will recheck Vitamin D with Monday labs on 09/29/22, and start on calcium carbonate supplementation 1250mg  every day starting today 7/20, suspect she'll need some form of vitamin D supplementation (daily vs weekly).   -7/23 Vit D 30.14 borderline, will start 1000u daily  19. Rib/sternum pain: hx of sternal fx from fall per PT -09/28/22 felt a pop while supporting her weight, thinks it's a pulled muscle, feels better upon eval, no deformity or significant tenderness; doubt need for imaging or work up, likely muscular or rib related; will order kpad and lidoderm PRN. Monitor. Activity as tolerated.  -7/22 rib pain improved with interventions above  -7/23 chest wall tenderness today, will increase oxycodone frequency to 10mg  Q4h PRN, will check CXR   LOS: 5 days A FACE TO FACE EVALUATION WAS PERFORMED  Fanny Dance 09/30/2022, 8:21 AM

## 2022-09-30 NOTE — Plan of Care (Signed)
Pt progressing

## 2022-09-30 NOTE — Progress Notes (Addendum)
Physical Therapy Session Note  Patient Details  Name: Diana Quinn MRN: [id number] Date of Birth: 10-31-1965  Today's Date: 09/30/2022 PT Individual Time: 0800-0913, 4098-1191 PT Individual Time Calculation (min): 73 min, 29 min   Short Term Goals: Week 1:  PT Short Term Goal 1 (Week 1): Pt will completed bed mobility with S PT Short Term Goal 2 (Week 1): Pt will completed slide transfer with S + slideboard PT Short Term Goal 3 (Week 1): Pt will complete WC propulsion with S PT Short Term Goal 4 (Week 1): Pt will complete car transfer with CGA + LRAD PT Short Term Goal 5 (Week 1): Pt will initiate stair training  Skilled Therapeutic Interventions/Progress Updates:      Treatment Session 1  Pt supine in bed upon arrival with K pad on. Pt reports she didn't sleep well last night 2/2 sharp sternal pain radiating to L UE and upper back. Notified nursing, nursing present in room.  Pt reports current 6/10 aching sternal pain, and 5/10 L LE achy pain, pt reports reduced with rest, pt pain appears to   Lateral pivot transfer hospital bed<>WC with set up assist, verbal cues provided for removing arm rest and donning cam boot prior to performing. Pt donned cam boot with min A.   Pt transported dependent in [facility] for energy conservation.   Stand pivot transfer x2 WC mat table, stand pivot transfer WC to/from elevated mat table with block in front to simualte car transfer to SUV with CGA and increased time, verbal cues provided for technique   Ascended/descended 2 6 inch steps with shower chair method with L UE on rail and R UE on shower chair with +1 mod A with intermittent +2 A needed for ascending for maintenance of L LE NWBing precautions.   Pt reports pt son is planning to come tomorrow, asked if pt can come at 2:00 to practice car transfer as pt reports she feels simulator is easy but is worried about actual car.   Pt performed the following seated therex for B LE strengthening:  1x10  LAQ 1x10 alternating marching  Pt reports need to use bathroom. Pt performed lateral pivot transfer to [facility] with CGA, with therapist positioning WC and managing drop arm rests. Pt continent of bladder. Pt performed sit<>stand with CGA, and doffed and donned pants with CGA. Pt performed lateral scoot transfer BSC to WC and WC to hospital bed with supervision.   Pt supine in bed with all needs within reach and bed alarm on at end of session.   Treatment Session 2   Pt supine in bed upon arrival. Pt agreeable to therapy. Pt continues to report intermittent sternal and L LE pain but reports decreased pain during session.   Pt requesting to work on stand pivot transfer. Pt performed sit<>stand x3  and stand pivot transfer hospital bed <> WC (in each direction) with RW and CGA with increased time, verbal cues provided for technique and sequencing.   Pt performed 2x10 wheelchair pushups, verbal cues provided for buttock clearance, verbal cues provided for technique.   Pt positioned wheelchair next to bed and performed WC next to hospital bed with supervision, verbal cues provided for sequencing with part management for safety, pt doffed arm rest, and leg rests with supervision. Pt performed lateral scoot transfer WC to bed with mod I.   Therapist followed up with pt regarding family training. Pt reports she has not called son yet, but plans to this PM when pt wakes  up as pt works night shift.   Pt seated in WC at end of session with all needs within reach and seatbelt alarm on.    Therapy Documentation Precautions:  Precautions Precautions: Fall Restrictions Weight Bearing Restrictions: Yes RLE Weight Bearing: Partial weight bearing RLE Partial Weight Bearing Percentage or Pounds: minimal with transfers only LLE Weight Bearing: Non weight bearing  Therapy/Group: Individual Therapy  [facility] Ambrose Finland, Weed, DPT  09/30/2022, 7:31 AM

## 2022-09-30 NOTE — Progress Notes (Signed)
Occupational Therapy Session Note  Patient Details  Name: Diana Quinn MRN: [id number] Date of Birth: Dec 31, 1965  Today's Date: 09/30/2022 OT Individual Time: 1100-1200 Session 1, 1445-1531 Session 2  OT Individual Time Calculation (min): 60 min, 46 min     Short Term Goals: Week 1:  OT Short Term Goal 1 (Week 1): pt will complete TTB and DABSC transfers with CGA OT Short Term Goal 2 (Week 1): pt will complete LB dressing with AE with set up OT Short Term Goal 3 (Week 1): Pt will perform ADL set up using w/c to gather items with dist s  Skilled Therapeutic Interventions/Progress Updates:   Session 1:  Pt seen for skilled OT session this am. Pt requested room level session due to fatigue and mild pain from previous sesison. OT focus on preparation for discharge for UE HEP as well as home set up needs. Family Education planned with son tomorrow pm and OT to address bathroom and sofa transfers. This session OT focus on BORG scale for energy conservation strategies as well as joint protection, falls prevention and UE therex. OT issued and trained in beige theraputty for hand grasp, roll, pinch, press for intrinsic strength as well as overall cdp management. Pt able to perform teach back of 3/3 joint protection and energy conservation strategies following session. Left bed level with all safety measures, needs and nurse call button in reach.   Pain:   6/10 sternal soreness using heat intermittently and R LE from transfer training earlier with pain meds administered by nursing this session and elevation    Session 2:  Pt seen for skilled OT session this pm. Pt voiced concern re: going home to use hospital bed and commode but OT reinforced time and safety is needed for LE healing and for WB status upgraded with MD clearance. Pt doing extremely well with basic transfers and ADL's and pt verbalized understanding. Son coming in tomorrow pm for Family Educ confirmed 2-3:30 pm. OT transported pt to  day room gym for UE therex table top. Pt performed 6 sets of 4 min with L4 resistance with rest required between sets. Once back in room, pt transferred w/c to bed via lateral modified scoot pivot to r side with CGA. Left pt with bed exit, needs and nurse call button in reach.    Pain: 5/10 sternal soreness, R LE un-rated "sore" with elevation on pillow   Therapy Documentation Precautions:  Precautions Precautions: Fall Restrictions Weight Bearing Restrictions: Yes RLE Weight Bearing: Partial weight bearing RLE Partial Weight Bearing Percentage or Pounds: minimal with transfers only LLE Weight Bearing: Non weight bearing  Therapy/Group: Individual Therapy  Vicenta Dunning 09/30/2022, 3:44 PM

## 2022-10-01 DIAGNOSIS — S82892S Other fracture of left lower leg, sequela: Secondary | ICD-10-CM

## 2022-10-01 DIAGNOSIS — S82891S Other fracture of right lower leg, sequela: Secondary | ICD-10-CM

## 2022-10-01 DIAGNOSIS — F3162 Bipolar disorder, current episode mixed, moderate: Secondary | ICD-10-CM

## 2022-10-01 LAB — BASIC METABOLIC PANEL
Anion gap: 10 (ref 5–15)
BUN: 11 mg/dL (ref 6–20)
CO2: 26 mmol/L (ref 22–32)
Calcium: 8.5 mg/dL — ABNORMAL LOW (ref 8.9–10.3)
Chloride: 92 mmol/L — ABNORMAL LOW (ref 98–111)
Creatinine, Ser: 0.7 mg/dL (ref 0.44–1.00)
GFR, Estimated: >60 mL/min (ref >60)
Glucose, Bld: 116 mg/dL — ABNORMAL HIGH (ref 70–99)
Potassium: 4.2 mmol/L (ref 3.5–5.1)
Sodium: 128 mmol/L — ABNORMAL LOW (ref 135–145)

## 2022-10-01 LAB — GLUCOSE, CAPILLARY
Glucose-Capillary: 106 mg/dL — ABNORMAL HIGH (ref 70–99)
Glucose-Capillary: 119 mg/dL — ABNORMAL HIGH (ref 70–99)

## 2022-10-01 MED ORDER — SENNOSIDES-DOCUSATE SODIUM 8.6-50 MG PO TABS
1.0000 | ORAL_TABLET | Freq: Every day | ORAL | Status: DC
  Administered 2022-10-01 – 2022-10-03 (×3): 1 via ORAL
  Filled 2022-10-01 (×3): qty 1

## 2022-10-01 MED ORDER — SODIUM CHLORIDE 1 G PO TABS
1.0000 g | ORAL_TABLET | Freq: Three times a day (TID) | ORAL | Status: DC
  Administered 2022-10-01 – 2022-10-02 (×3): 1 g via ORAL
  Filled 2022-10-01 (×3): qty 1

## 2022-10-01 MED ORDER — SORBITOL 70 % SOLN: 30.0000 mL | Freq: Every day | Status: DC | PRN

## 2022-10-01 MED ORDER — POLYETHYLENE GLYCOL 3350 17 G PO PACK
17.0000 g | PACK | Freq: Every day | ORAL | Status: DC
  Filled 2022-10-01 (×2): qty 1

## 2022-10-01 NOTE — Progress Notes (Signed)
Occupational Therapy Session Note  Patient Details  Name: Diana Quinn MRN: [id number] Date of Birth: 1965/05/18  Today's Date: 10/01/2022 OT Individual Time: 1000-1100 1st Session, 1500-1530 2nd Session  OT Individual Time Calculation (min): 60 min, 30 min   Short Term Goals: Week 1:  OT Short Term Goal 1 (Week 1): pt will complete TTB and DABSC transfers with CGA OT Short Term Goal 2 (Week 1): pt will complete LB dressing with AE with set up OT Short Term Goal 3 (Week 1): Pt will perform ADL set up using w/c to gather items with dist s  Skilled Therapeutic Interventions/Progress Updates:   Session 1:  Pt seen for skilled OT this am. Pt deferring shower again when offered until tomorrow. Pt requesting toileting with commode over toilet. OT assisted in/out of bathroom via w/c then pt able to transfer to R side on and L side off DABSC. Required close S only with + demonstration of precaution management. Pt able to complete peri hygiene with set up only. Clothing management with RW and grab bar with CGA to stand. Seated sink side hand washing mod I. Pt transported for time management to/from day room gym via w/c. OT progressed B UE HEP w/c level with cane exercises, scapular mobility, and light tband. 10 reps in all planes with + teach back and breathing integration. Added to written UE HEP. Back in room, pt requested transfer back to bed requiring S only with R sided squat pivot. Pt mod I to manage LE's and move EOB to supine. Left with bed exit engaged, needs and nurse call button in reach.    Pain:5/10 sternal and R foot from steps and pivot transfers with rest, meds and repositioning for relief     Session 2:  Pt seen for Family Education with son and DIL. OT training focus on bathroom transfer safety with DABSC and TTB. Pt and family able to demonstrate with close s, use of gait belt and following WB precautions. OT educated on full UE HEP with written information with + teach back.  Family requesting TB for safety and OT added to equipment recs. Family reports no further questions for OT. Left pt bed level with exit engaged, needs, and nurse call button in reach.   Pain: 5/10 sternal and R foot pain with repositioning and elevation   Therapy Documentation Precautions:  Precautions Precautions: Fall Restrictions Weight Bearing Restrictions: Yes RLE Weight Bearing: Weight bearing as tolerated RLE Partial Weight Bearing Percentage or Pounds: minimal with transfers only LLE Weight Bearing: Non weight bearing    Therapy/Group: Individual Therapy  Vicenta Dunning 10/01/2022, 7:41 AM

## 2022-10-01 NOTE — Progress Notes (Signed)
Patient ID: Diana Quinn, female   DOB: 11-Dec-1965, 57 y.o.   MRN: [id number] Met with pt to give team conference update regarding goals of mod/I wheelchair and target discharge date 7/30. Son to be here at 2:00 to begin training on getting up and down stairs into their home. Pt is doing well and making progress. Will order recommended equipment and will need OP rehab once can WB on her legs. Continue to work on discharge needs.

## 2022-10-01 NOTE — Plan of Care (Signed)
Discontinued furniture goal as pt will be using WC and hospital bed at home.  Problem: RH Furniture Transfers Goal: LTG Patient will perform furniture transfers w/assist (OT/PT) Description: LTG: Patient will perform furniture transfers  with assistance (OT/PT). Outcome: Not Applicable

## 2022-10-01 NOTE — Progress Notes (Addendum)
PROGRESS NOTE   Subjective/Complaints:  Patient has continued sternal discomfort mostly after therapy.  She reports that she is having pain in her fingers from the repeated CBGs.  She reports she does not have diabetes mellitus but instead was started on a carbose for prandial hyperglycemia after gastric bypass surgery.  ROS: as per HPI.  SOB, abd pain, N/V/D, or any other complaints at this time.   +LLE pain + chest wall soreness +constipation  Objective:   DG Chest 2 View  Result Date: 09/30/2022 CLINICAL DATA:  Chest wall discomfort. EXAM: CHEST - 2 VIEW COMPARISON:  Chest radiograph dated 09/12/2022. FINDINGS: No focal consolidation, pleural effusion, or pneumothorax. The cardiac silhouette is within normal limits. No acute osseous pathology. Degenerative changes of the spine. IMPRESSION: No active cardiopulmonary disease. Electronically Signed   By: Elgie Collard M.D.   On: 09/30/2022 21:18   Recent Labs    09/29/22 0700  WBC 6.2  HGB 9.9*  HCT 30.9*  PLT 506*   Recent Labs    09/30/22 0737 10/01/22 0704  NA 128* 128*  K 4.3 4.2  CL 97* 92*  CO2 23 26  GLUCOSE 233* 116*  BUN 13 11  CREATININE 0.71 0.70  CALCIUM 8.1* 8.5*     Intake/Output Summary (Last 24 hours) at 10/01/2022 0842 Last data filed at 10/01/2022 0734 Gross per 24 hour  Intake 1076 ml  Output 1400 ml  Net -324 ml        Physical Exam: Vital Signs Blood pressure (!) 143/80, pulse 79, temperature 98.1 F (36.7 C), resp. rate 16, height 5\' 3"  (1.6 m), weight 93.8 kg, SpO2 100%.   General: Obese, No apparent distress, lying in bed HEENT: Head is normocephalic, atraumatic, MMM, poor dentition Neck: Supple without JVD Heart: Reg rate and rhythm. No murmurs rubs or gallops Chest: CTA bilaterally without wheezes, rales, or rhonchi; no distress. No focal area of bruising or significant tenderness to chest wall, no deformities.  + chest  wall soreness replicating her pain, tender around sternum- continued Abdomen: Soft, non-tender, non-distended, bowel sounds normoactive Extremities: Right lower extremity in Cam walker, left lower extremity short leg cast in place Psych: Pt's affect is appropriate. Pt is cooperative Skin: Clean and intact without signs of breakdown  Neuro: alert and awake, follows commands, cranial nerves II through XII grossly intact, normal speech and language Strength 5 out of 5 in bilateral upper extremities Able to lift bilateral lower extremities to gravity with hip flexion and knee extension Able to wiggle her toes bilaterally  Assessment/Plan: 1. Functional deficits which require 3+ hours per day of interdisciplinary therapy in a comprehensive inpatient rehab setting. Physiatrist is providing close team supervision and 24 hour management of active medical problems listed below. Physiatrist and rehab team continue to assess barriers to discharge/monitor patient progress toward functional and medical goals  Care Tool:  Bathing    Body parts bathed by patient: Right arm, Left arm, Chest, Abdomen, Front perineal area, Buttocks, Right upper leg, Left upper leg, Face   Body parts bathed by helper: Right lower leg Body parts n/a: Left lower leg   Bathing assist Assist Level: Moderate Assistance - Patient  50 - 74%     Upper Body Dressing/Undressing Upper body dressing   What is the patient wearing?: Pull over shirt    Upper body assist Assist Level: Set up assist    Lower Body Dressing/Undressing Lower body dressing      What is the patient wearing?: Pants     Lower body assist Assist for lower body dressing: Moderate Assistance - Patient 50 - 74%     Toileting Toileting    Toileting assist Assist for toileting: Maximal Assistance - Patient 25 - 49%     Transfers Chair/bed transfer  Transfers assist  Chair/bed transfer activity did not occur: Safety/medical concerns  Chair/bed  transfer assist level: Contact Guard/Touching assist     Locomotion Ambulation   Ambulation assist   Ambulation activity did not occur: Safety/medical concerns (2/2 high pain and BLE WB restrictions)          Walk 10 feet activity   Assist  Walk 10 feet activity did not occur: Safety/medical concerns (2/2 high pain and BLE WB restrictions)        Walk 50 feet activity   Assist Walk 50 feet with 2 turns activity did not occur: Safety/medical concerns (2/2 high pain and BLE WB restrictions)         Walk 150 feet activity   Assist Walk 150 feet activity did not occur: Safety/medical concerns (2/2 high pain and BLE WB restrictions)         Walk 10 feet on uneven surface  activity   Assist Walk 10 feet on uneven surfaces activity did not occur: Safety/medical concerns (2/2 high pain and BLE WB restrictions)         Wheelchair     Assist Is the patient using a wheelchair?: Yes Type of Wheelchair: Manual    Wheelchair assist level: Supervision/Verbal cueing Max wheelchair distance: 160ft    Wheelchair 50 feet with 2 turns activity    Assist        Assist Level: Supervision/Verbal cueing   Wheelchair 150 feet activity     Assist      Assist Level: Supervision/Verbal cueing   Blood pressure (!) 143/80, pulse 79, temperature 98.1 F (36.7 C), resp. rate 16, height 5\' 3"  (1.6 m), weight 93.8 kg, SpO2 100%.  Medical Problem List and Plan: 1. Functional deficits secondary to left distal tibia fracture nondisplaced and right nondisplaced ankle fracture.  Followed by podiatry services Dr. Nicholes Rough.  By latest note 09/21/2022 podiatry services minimal weightbearing right lower extremity in cam walker.  Bathroom and transition purposes only.  Nonweightbearing left lower extremity with below-knee fiberglass cast applied             -patient may shower -ELOS/Goals: modified independent PT, modified independent OT, 10-12 days - NWB LLE 4-6  weeks, minimal WB RLE in CAM walker w/ walker (Bathroom and transition purposes only) per podiatry; will need to clarify with Podiatry duration for RLE restrictions  - patient lives in a trailer and endorses doorways too small for wheelchair; supervision available but may not have physical assistance. Will need to discuss goals with team in AM.    -Continue CIR -Team conference today please see physician documentation under team conference tab, met with team  to discuss problems,progress, and goals. Formulized individual treatment plan based on medical history, underlying problem and comorbidities.     2.  Antithrombotics: -DVT/anticoagulation:  Pharmaceutical: Lovenox 40mg  QD, DVT U/S neg but limited             -  antiplatelet therapy: N/A 3. Pain Management: Zanaflex 4 mg 3 times daily, oxycodone/Advil/tylenol as needed  -7/19 continue oxycodone for lower extremity pain 4. Mood/Behavior/Sleep: History of PTSD anxiety with schizophrenia.  Continue Caplyta 42mg  QD, Paxil 40 mg daily, trazodone 300 mg nightly, Atarax as needed             - antipsychotic agents: Seroquel 300 mg nightly - takes Belsomra 20 mg at bedtime at home; not on formulary, would discuss with patient bringing from home  -7/19 patient reports she slept better last night however she would like to restart Belsomra.  Will see if patient can have her family member bring in her medication for use in the hospital.  She reports chronic insomnia -09/27/22 pt reports not having many doses of Belsomra-- wants to try melatonin 5mg , scheduled nightly rather than PRN- updated order -09/28/22 slept better overnight -7/24 continues to have intermittently poor sleep, plans to have son bring Belsomra today 5. Neuropsych/cognition: This patient is capable of making decisions on her own behalf. 6. Skin/Wound Care: Xeroform over left heel wound 7. Fluids/Electrolytes/Nutrition: Routine and analysis with follow-up chemistries 8.  History of seizure  disorder.  Trileptal 600 mg twice daily 9.  History of unstable angina.  Ranexa 1000 mg twice daily, Minipress 2 mg nightly 10.  Hypothyroidism.  Synthroid QD 11.  Hyperlipidemia.  Lipitor 40mg  QD 12.  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  Follow-up dietary 13.  History of alcohol use.  Counseling. Patient denies currently.  14.  Anemia of chronic disease.  Continue iron supplement.  Follow-up CBC  -7/19 Hgb improved to 11.5 continue to monitor  7/22 overall stable hgb at 9.9, continue to follow with weekly labs 15.  Thrombocytosis.  Likely reactive, recheck Monday 16.  Hyponatremia mild.  Appears to be occurring intermittently on chronic basis, recheck Monday  -7/22 start salt tabs BID, recheck tomorrow  -7/23 fluid restriction, continue to monitor sodium level  -7/24 BMP daily, increase salt tab to TID 17. DM2 with hypoglycemia?,  Patient reports she did not have diabetes mellitus but instead was treated for postprandial hypoglycemia after gastric bypass surgery: ?previously on Acarbose 100mg  TID (unclear if patient was taking this) -09/27/22 CBGs variable and episode of hypoglycemia today; continue CBG monitoring and SSI, monitor -7/24 discontinue SSI, continue CBG monitoring but decreased to BID  CBG (last 3)  Recent Labs    09/30/22 1624 09/30/22 2055 10/01/22 0606  GLUCAP 104* 119* 106*    18. Hx of vitamin D deficiency and hypocalcemia, now with pathologic fx: likely needs DEXA scan, appears that last Vit D level was in 2020 after appropriate replacement therapy, and then was on some form of Calcium-Vit D supplement (500mg  [1250mg ]-200IU dosing?) but had come off it at some point. Calcium low at 8.5 here at admission. Will recheck Vitamin D with Monday labs on 09/29/22, and start on calcium carbonate supplementation 1250mg  every day starting today 7/20, suspect she'll need some form of vitamin D supplementation (daily vs weekly).   -7/23 Vit D 30.14 borderline,  will start 1000u daily  19. Rib/sternum pain: hx of sternal fx from fall per PT -09/28/22 felt a pop while supporting her weight, thinks it's a pulled muscle, feels better upon eval, no deformity or significant tenderness; doubt need for imaging or work up, likely muscular or rib related; will order kpad and lidoderm PRN. Monitor. Activity as tolerated.  -7/22 rib pain improved with interventions above  -7/23 chest wall tenderness today,  will increase oxycodone frequency to 10mg  Q4h PRN, will check CXR -7/24 CXR unremarkable, continue current management 20. Constipation  -7/24 start miralax and senokot daily,sorbitol PRN   LOS: 6 days A FACE TO FACE EVALUATION WAS PERFORMED  Fanny Dance 10/01/2022, 8:42 AM

## 2022-10-01 NOTE — Plan of Care (Signed)
  Problem: RH BOWEL ELIMINATION Goal: RH STG MANAGE BOWEL WITH ASSISTANCE Description: STG Manage Bowel with mod I Assistance. Outcome: Not Progressing; pt has IBS ; refuses laxatives LBM 7/18 per report

## 2022-10-01 NOTE — Patient Care Conference (Signed)
Inpatient RehabilitationTeam Conference and Plan of Care Update Date: 10/01/2022   Time: 12:27 PM    Patient Name: Diana Quinn      Medical Record Number: [id number]  Date of Birth: 04/05/1965 Sex: Female         Room/Bed: 4W12C/4W12C-02 Payor Info: Payor: [employer] / Plan: [employer] / Product Type: *No Product type* /    Admit Date/Time:  09/25/2022  3:46 PM  Primary Diagnosis:  Bilateral ankle fractures  Hospital Problems: Principal Problem:   Bilateral ankle fractures Active Problems:   Type 2 diabetes mellitus with hyperglycemia, without long-term current use of insulin [facility])    Expected Discharge Date: Expected Discharge Date: 10/07/22  Team Members Present: Physician leading conference: Dr. Fanny Dance Social Worker Present: Dossie Der, LCSW Nurse Present: Chana Bode, RN PT Present: Ambrose Finland, PT OT Present: Lou Cal, OT PPS Coordinator present : Fae Pippin, SLP     Current Status/Progress Goal Weekly Team Focus  Bowel/Bladder   Pt is continent. LBM: 7/18 (Pt has hx of IBS and denies need for laxatives at this time)   Remain continent; Pt will have consistent BMs.   Assist w/ toileting needs q 2-4 hours and as needed.    Swallow/Nutrition/ Hydration               ADL's   Supervision for all ADLs at [facility] level or EOB   Mod I for UB, S LB   General conditioning and discharge planning.    Mobility   bed mobility mod I, lateral scoot transfer supervision, sit<>stand CGA with RW, stand pivot transfer CGA with RW for car transfer, ascending/descendig 7 steps with RW and CGA   supervision  family training scheduled for 2:00 with son 7/24, pt needs 18x16 WC, RW, shower chair, drop arm commode. Pt son will determine hospital bed versus couch    Communication                Safety/Cognition/ Behavioral Observations               Pain   Pt has consistent pain in bilat. ankles & chest pain. Pt  rates pain in ankles as 8/10 and chest as 5/10. Pain medication given q 4 hours. Lidocaine patch is also used for chest pain.   Pt will rate pain <5/10.   Assess pain q shift & PRN.    Skin   Skin is intact. (Cast is on LLE)   Maintain skin integrity.  Assess skin q shift & PRN.      Discharge Planning:  HOme with son and his family she will be alone some and will need to be mod/i wheelchair level. Barriers are the steps to get into home. Will await team's recommnedations for DME and will see if [facility] needed or wait until she can WB then will need OP   Team Discussion: Patient with pain post bilateral ankle fractures with hyponatremia and history of hypoglycemia.  Patient on target to meet rehab goals: yes, currently needs supervision to complete a lateral scoot but completes sit -stand; stand pivot with CGA using a RW.  Completes stair management using a shower chair and needs supervision for ADLs.  *See Care Plan and progress notes for long and short-term goals.   Revisions to Treatment Plan:  N/a   Teaching Needs: Safety, medications, dietary modification, transfers, toileting, etc.   Current Barriers to Discharge: Decreased caregiver support, Home enviroment access/layout, and Weight bearing restrictions  Possible Resolutions to Barriers: Follow-up with team at inpatient rehab  Follow-up podiatry Dr. Nicholes Rough 2 weeks  Follow-up with medical doctor once out of inpatient rehab  Family education DME: hospital bed, RW, W/C, TTB, drop arm BSC, shower chair     Medical Summary Current Status: trauma, insomnia, Sternal/chest wall pain, hyponatremia,hypoglycemia  Barriers to Discharge: Uncontrolled Pain;Medical stability  Barriers to Discharge Comments: trauma, insomnia, Sternal/chest wall pain, hyponatremia,hypoglycemia Possible Resolutions to [employer] Focus: pain medications, monitor NA, salt tabs and fluid restriction, monitor CBGs-decrease frequency   Continued  Need for Acute Rehabilitation Level of Care: The patient requires daily medical management by a physician with specialized training in physical medicine and rehabilitation for the following reasons: Direction of a multidisciplinary physical rehabilitation program to maximize functional independence : Yes Medical management of patient stability for increased activity during participation in an intensive rehabilitation regime.: Yes Analysis of laboratory values and/or radiology reports with any subsequent need for medication adjustment and/or medical intervention. : Yes   I attest that I was present, lead the team conference, and concur with the assessment and plan of the team.   Chana Bode B 10/01/2022, 3:40 PM

## 2022-10-01 NOTE — Progress Notes (Signed)
Patient continues to refuse laxatives claims she feels like she will go sometime today and does not want to have diarrhea  if she will take meds for bowel movement due to Irritable Bowel movement.

## 2022-10-01 NOTE — Progress Notes (Addendum)
Physical Therapy Session Note  Patient Details  Name: Diana Quinn MRN: [id number] Date of Birth: 10-Nov-1965  Today's Date: 10/01/2022 PT Individual Time: 0800-0900, 1400-1501 PT Individual Time Calculation (min): 60 min, 61 min   Short Term Goals: Week 1:  PT Short Term Goal 1 (Week 1): Pt will completed bed mobility with S PT Short Term Goal 2 (Week 1): Pt will completed slide transfer with S + slideboard PT Short Term Goal 3 (Week 1): Pt will complete WC propulsion with S PT Short Term Goal 4 (Week 1): Pt will complete car transfer with CGA + LRAD PT Short Term Goal 5 (Week 1): Pt will initiate stair training  Skilled Therapeutic Interventions/Progress Updates:      Pt supine in bed upon arrival. Pt agreeable to therapy. Pt continues to reports intermittent sternal pain and L LE pain however does not interfere with therapy.   Pt performed supine to sit with mod I, and lateral scoot transfer WC to bed with supervision/mod I.   Pt ascended/descended 7 6 inch steps with shower chair method with  +1 min A to lift L LE to maintain L LE precautions, with therapist manuevering shower chair throughout. Pt performed lateral scoot transfer WC to/from shower chair with CGA to stabilize shower chair. Education provided on need to purchase shower chair, and RW.  Stand pivot transfer WC to elevated mat table to simulate SUV height with CGA, verbal cues provided for safety with RW, sequencing and technique. Plan to practice with pt son car during family training.   Education on need for a reacher to pick up objects above head and off of floor. Education provided to position wheelchair to side of object versus in front to reduce strain on back. Pt picked up cones with reacher and 5 pound dowel rod with use of UE from [facility], with supervision, supervision, verbal cues provided for objects appropriate to pick up with reacher versus requiring hand.  Education provided to keep heavier objects such as milk  carton, and laundry detergent between height of shoulders and waist to reduce strain on back.   Pt seated in WC at end of session with all needs within reach and seatbelt alarm on.    Treatment Session 2   Pt supine in bed upon arrival. Pt agreeable to therapy. Pt reports intermittent L LE and sternal pain. Pt donned CAM BOOT with min A as pt had difficulty removing velcro. Plan to modify straps next session to improve independence with donning and doffing.   Pt performed lateral scoot transfer WC with supervision.   Pt transported to hospital stairs near main gym for time/energy conservation. Pt son and daughter in law present for family training. Therapist provided demonstration of stair navigation with shower chair technique. Pt ascended 4 steps with therapist providing mod A initially progressing to min A for power up and maintaining L LE NWBing precautions. Pt son returned demonstration with therapist ascending 3 steps and descending 7 steps with same technique, verbal cues provided by therapist to pt son for assisting with controlled descent with descending stairs, and with UE placement for safety.   Pt performed stand pivot transfer with RW WC<>elevated mat table to simulate car transfer with therapist providing CGA, pt son returned demonstration.   Discussed equipment needed for home including hospital bed, RW (Pt has at home), shower chair (pt will need to purchase) transfer tub bench, WC, and reacher.   Education provided for supervision with lateral scoot transfers to ensure pt WC  brakes are locked, arm rest is up and CAM boot is on.   Family expressed concerns about pt fall risk, and pt attempting transfers without assistance, especially at night, recommended 24/7 supervision for safety. Education provided for bed alarm if needed for reassurance at home.   Pt performed lateral scoot transfer WC <> bed with supervision, family returned demonstration of WC set up and cues needed for  safety with locking brakes and ensuring CAM boot is on.   Pt seated in WC at end of session with all needs within reach and bed alarm on.   Therapy Documentation Precautions:  Precautions Precautions: Fall Restrictions Weight Bearing Restrictions: Yes RLE Weight Bearing: Weight bearing as tolerated RLE Partial Weight Bearing Percentage or Pounds: minimal with transfers only LLE Weight Bearing: Non weight bearing  Therapy/Group: Individual Therapy  [facility], [place], DPT  10/01/2022, 8:28 AM

## 2022-10-02 DIAGNOSIS — S82891D Other fracture of right lower leg, subsequent encounter for closed fracture with routine healing: Secondary | ICD-10-CM

## 2022-10-02 DIAGNOSIS — S82892D Other fracture of left lower leg, subsequent encounter for closed fracture with routine healing: Secondary | ICD-10-CM

## 2022-10-02 DIAGNOSIS — F3162 Bipolar disorder, current episode mixed, moderate: Secondary | ICD-10-CM

## 2022-10-02 DIAGNOSIS — E871 Hypo-osmolality and hyponatremia: Secondary | ICD-10-CM

## 2022-10-02 LAB — BASIC METABOLIC PANEL
Anion gap: 9 (ref 5–15)
BUN: 12 mg/dL (ref 6–20)
CO2: 24 mmol/L (ref 22–32)
Calcium: 8.2 mg/dL — ABNORMAL LOW (ref 8.9–10.3)
Chloride: 94 mmol/L — ABNORMAL LOW (ref 98–111)
Creatinine, Ser: 0.68 mg/dL (ref 0.44–1.00)
GFR, Estimated: >60 mL/min (ref >60)
Glucose, Bld: 109 mg/dL — ABNORMAL HIGH (ref 70–99)
Sodium: 127 mmol/L — ABNORMAL LOW (ref 135–145)

## 2022-10-02 LAB — GLUCOSE, CAPILLARY
Glucose-Capillary: 243 mg/dL — ABNORMAL HIGH (ref 70–99)
Glucose-Capillary: 163 mg/dL — ABNORMAL HIGH (ref 70–99)

## 2022-10-02 LAB — SODIUM, URINE, RANDOM: Sodium, Ur: 59 mmol/L

## 2022-10-02 LAB — OSMOLALITY: Osmolality: 270 mOsm/kg — ABNORMAL LOW (ref 275–295)

## 2022-10-02 LAB — OSMOLALITY, URINE: Osmolality, Ur: 831 mOsm/kg (ref 300–900)

## 2022-10-02 MED ORDER — FUROSEMIDE 20 MG PO TABS
20.0000 mg | ORAL_TABLET | Freq: Every day | ORAL | Status: DC
  Administered 2022-10-02 – 2022-10-03 (×2): 20 mg via ORAL
  Filled 2022-10-02 (×2): qty 1

## 2022-10-02 MED ORDER — POLYETHYLENE GLYCOL 3350 17 G PO PACK: 17.0000 g | PACK | Freq: Every day | ORAL | Status: DC | PRN

## 2022-10-02 NOTE — Progress Notes (Signed)
Physical Therapy Session Note  Patient Details  Name: Diana Quinn MRN: [id number] Date of Birth: [birth year]  Today's Date: 10/02/2022 PT Individual Time: 0630-1601 PT Individual Time Calculation (min): 70 min   Short Term Goals: Week 1:  PT Short Term Goal 1 (Week 1): Pt will completed bed mobility with S PT Short Term Goal 2 (Week 1): Pt will completed slide transfer with S + slideboard PT Short Term Goal 3 (Week 1): Pt will complete WC propulsion with S PT Short Term Goal 4 (Week 1): Pt will complete car transfer with CGA + LRAD PT Short Term Goal 5 (Week 1): Pt will initiate stair training  Skilled Therapeutic Interventions/Progress Updates:     Pt received supine in bed and agrees to therapy. Reports "sciatic" pain in RLE. PT provides rest breaks as needed to manage pain, and alerts RN to pain, who arrives to provide pain medication. Pt performs bed mobility with bed features and squat pivot transfer to [facility] with CGA and cues for sequencing. Pt self propels WC x100' to gym for endurance training and mobility training. PT notes that pt's WC has wheels askew, so provides pt with new WC for improved function. Pt transfers from old WC to mat to new WC with CGA and cues for positioning. Pt then self propels WC x150' to dayroom with improved ease of propulsion. Pt then participates in Wii bowling activity to challenge activity tolerance, standing balance, and coordination. Pt performing standing activity with RUE support on RW for increased balance challenge. Pt completes game with x1 extended seated rest. PT provides cues for posture and body mechanics, and provides CGA for safety. Pt self propels WC back go room. Squat pivot back to bed with CGA and cues for positioning. Pt left supine in bed with alarm intact and all needs within reach.   Therapy Documentation Precautions:  Precautions Precautions: Fall Restrictions Weight Bearing Restrictions: Yes RLE Weight Bearing: Weight bearing as  tolerated RLE Partial Weight Bearing Percentage or Pounds: minimal with transfers only LLE Weight Bearing: Non weight bearing   Therapy/Group: Individual Therapy  Beau Fanny, PT, DPT 10/02/2022, 4:50 PM

## 2022-10-02 NOTE — Progress Notes (Signed)
PROGRESS NOTE   Subjective/Complaints:  Patient continues to report her sternal pain with therapy.  She had a bowel movement yesterday.  No additional concerns.  Sodium continues to be low at 127 today.  ROS: as per HPI.  SOB, abd pain, N/V/D, or any other complaints at this time.   +LLE pain + chest wall soreness +constipation-improved  Objective:   DG Chest 2 View  Result Date: 09/30/2022 CLINICAL DATA:  Chest wall discomfort. EXAM: CHEST - 2 VIEW COMPARISON:  Chest radiograph dated 09/12/2022. FINDINGS: No focal consolidation, pleural effusion, or pneumothorax. The cardiac silhouette is within normal limits. No acute osseous pathology. Degenerative changes of the spine. IMPRESSION: No active cardiopulmonary disease. Electronically Signed   By: Elgie Collard M.D.   On: 09/30/2022 21:18   No results for input(s): "WBC", "HGB", "HCT", "PLT" in the last 72 hours.  Recent Labs    10/01/22 0704 10/02/22 0710  NA 128* 127*  K 4.2 3.8  CL 92* 94*  CO2 26 24  GLUCOSE 116* 109*  BUN 11 12  CREATININE 0.70 0.68  CALCIUM 8.5* 8.2*     Intake/Output Summary (Last 24 hours) at 10/02/2022 1448 Last data filed at 10/02/2022 1212 Gross per 24 hour  Intake 1432 ml  Output 525 ml  Net 907 ml        Physical Exam: Vital Signs Blood pressure 117/76, pulse 77, temperature 97.8 F (36.6 C), resp. rate 18, height 5\' 3"  (1.6 m), weight 93.8 kg, SpO2 100%.   General: Obese, No apparent distress, lying in bed HEENT: Head is normocephalic, atraumatic, MMM, poor dentition Neck: Supple without JVD Heart: Reg rate and rhythm. No murmurs rubs or gallops Chest: CTA bilaterally without wheezes, rales, or rhonchi; no distress. No focal area of bruising or significant tenderness to chest wall, no deformities.  + chest wall soreness replicating her pain, tender around sternum- continued Abdomen: Soft, non-tender, non-distended, bowel  sounds normoactive Extremities: Right lower extremity in Cam walker, left lower extremity short leg cast in place Psych: Pt's affect is appropriate. Pt is cooperative Skin: Clean and intact without signs of breakdown  Neuro: alert and awake, cranial nerves II through XII grossly intact, normal speech and language, reasonable insight Strength 5 out of 5 in bilateral upper extremities Able to lift bilateral lower extremities to gravity with hip flexion and knee extension Able to wiggle her toes bilaterally  Assessment/Plan: 1. Functional deficits which require 3+ hours per day of interdisciplinary therapy in a comprehensive inpatient rehab setting. Physiatrist is providing close team supervision and 24 hour management of active medical problems listed below. Physiatrist and rehab team continue to assess barriers to discharge/monitor patient progress toward functional and medical goals  Care Tool:  Bathing    Body parts bathed by patient: Right arm, Left arm, Chest, Abdomen, Front perineal area, Buttocks, Right upper leg, Left upper leg, Face   Body parts bathed by helper: Right lower leg Body parts n/a: Left lower leg   Bathing assist Assist Level: Moderate Assistance - Patient 50 - 74%     Upper Body Dressing/Undressing Upper body dressing   What is the patient wearing?: Pull over shirt  Upper body assist Assist Level: Set up assist    Lower Body Dressing/Undressing Lower body dressing      What is the patient wearing?: Pants     Lower body assist Assist for lower body dressing: Moderate Assistance - Patient 50 - 74%     Toileting Toileting    Toileting assist Assist for toileting: Maximal Assistance - Patient 25 - 49%     Transfers Chair/bed transfer  Transfers assist  Chair/bed transfer activity did not occur: Safety/medical concerns  Chair/bed transfer assist level: Supervision/Verbal cueing (lateral transfer)     Locomotion Ambulation   Ambulation  assist   Ambulation activity did not occur: Safety/medical concerns          Walk 10 feet activity   Assist  Walk 10 feet activity did not occur: Safety/medical concerns        Walk 50 feet activity   Assist Walk 50 feet with 2 turns activity did not occur: Safety/medical concerns         Walk 150 feet activity   Assist Walk 150 feet activity did not occur: Safety/medical concerns         Walk 10 feet on uneven surface  activity   Assist Walk 10 feet on uneven surfaces activity did not occur: Safety/medical concerns         Wheelchair     Assist Is the patient using a wheelchair?: Yes Type of Wheelchair: Manual    Wheelchair assist level: Independent, Supervision/Verbal cueing Max wheelchair distance: 149ft    Wheelchair 50 feet with 2 turns activity    Assist        Assist Level: Independent   Wheelchair 150 feet activity     Assist      Assist Level: Independent   Blood pressure 117/76, pulse 77, temperature 97.8 F (36.6 C), resp. rate 18, height 5\' 3"  (1.6 m), weight 93.8 kg, SpO2 100%.  Medical Problem List and Plan: 1. Functional deficits secondary to left distal tibia fracture nondisplaced and right nondisplaced ankle fracture.  Followed by podiatry services Dr. Nicholes Rough.  By latest note 09/21/2022 podiatry services minimal weightbearing right lower extremity in cam walker.  Bathroom and transition purposes only.  Nonweightbearing left lower extremity with below-knee fiberglass cast applied             -patient may shower -ELOS/Goals: modified independent PT, modified independent OT, 10-12 days - NWB LLE 4-6 weeks, minimal WB RLE in CAM walker w/ walker (Bathroom and transition purposes only) per podiatry; will need to clarify with Podiatry duration for RLE restrictions  - patient lives in a trailer and endorses doorways too small for wheelchair; supervision available but may not have physical assistance. Will need to  discuss goals with team in AM.    -Continue CIR -May discharge 7/30   2.  Antithrombotics: -DVT/anticoagulation:  Pharmaceutical: Lovenox 40mg  QD, DVT U/S neg but limited             -antiplatelet therapy: N/A 3. Pain Management: Zanaflex 4 mg 3 times daily, oxycodone/Advil/tylenol as needed  -7/19 continue oxycodone for lower extremity pain 4. Mood/Behavior/Sleep: History of PTSD anxiety with schizophrenia.  Continue Caplyta 42mg  QD, Paxil 40 mg daily, trazodone 300 mg nightly, Atarax as needed             - antipsychotic agents: Seroquel 300 mg nightly - takes Belsomra 20 mg at bedtime at home; not on formulary, would discuss with patient bringing from home  -7/19 patient reports  she slept better last night however she would like to restart Belsomra.  Will see if patient can have her family member bring in her medication for use in the hospital.  She reports chronic insomnia -09/27/22 pt reports not having many doses of Belsomra-- wants to try melatonin 5mg , scheduled nightly rather than PRN- updated order -09/28/22 slept better overnight -7/24 continues to have intermittently poor sleep, plans to have son bring Belsomra today 5. Neuropsych/cognition: This patient is capable of making decisions on her own behalf. 6. Skin/Wound Care: Xeroform over left heel wound 7. Fluids/Electrolytes/Nutrition: Routine and analysis with follow-up chemistries 8.  History of seizure disorder.  Trileptal 600 mg twice daily 9.  History of unstable angina.  Ranexa 1000 mg twice daily, Minipress 2 mg nightly 10.  Hypothyroidism.  Synthroid QD 11.  Hyperlipidemia.  Lipitor 40mg  QD 12.  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  Follow-up dietary 13.  History of alcohol use.  Counseling. Patient denies currently.  14.  Anemia of chronic disease.  Continue iron supplement.  Follow-up CBC  -7/19 Hgb improved to 11.5 continue to monitor  7/22 overall stable hgb at 9.9, continue to follow with weekly  labs 15.  Thrombocytosis.  Likely reactive, recheck Monday 16.  Hyponatremia mild.  Appears to be occurring intermittently on chronic basis, recheck Monday  -7/22 start salt tabs BID, recheck tomorrow  -7/23 fluid restriction, continue to monitor sodium level  -7/24 BMP daily, increase salt tab to TID  -7/25 consulted internal medicine for hyponatremia, appreciate assistance 17. DM2 with hypoglycemia?,  Patient reports she did not have diabetes mellitus but instead was treated for postprandial hypoglycemia after gastric bypass surgery: ?previously on Acarbose 100mg  TID (unclear if patient was taking this) -09/27/22 CBGs variable and episode of hypoglycemia today; continue CBG monitoring and SSI, monitor -7/24 discontinue SSI, continue CBG monitoring but decreased to BID -7/25 CBG stable, continue to monitor, she plans to follow-up with endocrinology outpatient  CBG (last 3)  Recent Labs    10/01/22 0606 10/01/22 1152 10/02/22 0810  GLUCAP 106* 119* 243*    18. Hx of vitamin D deficiency and hypocalcemia, now with pathologic fx: likely needs DEXA scan, appears that last Vit D level was in 2020 after appropriate replacement therapy, and then was on some form of Calcium-Vit D supplement (500mg  [1250mg ]-200IU dosing?) but had come off it at some point. Calcium low at 8.5 here at admission. Will recheck Vitamin D with Monday labs on 09/29/22, and start on calcium carbonate supplementation 1250mg  every day starting today 7/20, suspect she'll need some form of vitamin D supplementation (daily vs weekly).   -7/23 Vit D 30.14 borderline, will start 1000u daily  19. Rib/sternum pain: hx of sternal fx from fall per PT -09/28/22 felt a pop while supporting her weight, thinks it's a pulled muscle, feels better upon eval, no deformity or significant tenderness; doubt need for imaging or work up, likely muscular or rib related; will order kpad and lidoderm PRN. Monitor. Activity as tolerated.  -7/22 rib pain  improved with interventions above  -7/23 chest wall tenderness today, will increase oxycodone frequency to 10mg  Q4h PRN, will check CXR -7/24 CXR unremarkable, continue current management -7/25 continue current pain medication, controlled overall 20. Constipation  -7/24 start miralax and senokot daily,sorbitol PRN  -7/25 BM yesterday, she is declining MiraLAX will change to as needed   LOS: 7 days A FACE TO FACE EVALUATION WAS PERFORMED  Fanny Dance 10/02/2022, 2:48 PM

## 2022-10-02 NOTE — Progress Notes (Signed)
Physical Therapy Session Note  Patient Details  Name: Diana Quinn MRN: [id number] Date of Birth: 27-Apr-1965  Today's Date: 10/02/2022 PT Individual Time: 7829-5621 PT Individual Time Calculation (min): 70 min   Short Term Goals: Week 1:  PT Short Term Goal 1 (Week 1): Pt will completed bed mobility with S PT Short Term Goal 2 (Week 1): Pt will completed slide transfer with S + slideboard PT Short Term Goal 3 (Week 1): Pt will complete WC propulsion with S PT Short Term Goal 4 (Week 1): Pt will complete car transfer with CGA + LRAD PT Short Term Goal 5 (Week 1): Pt will initiate stair training  Skilled Therapeutic Interventions/Progress Updates:      Pt supine in bed upon arrival. Pt agreeable to therapy. Pt reports soreness and general 7/10 pain from activity yesterday, notified nurse, nurse administered medication.   Therapist applied splint strap on ditstal end of each of velcro strap as pt has difficulty taking off straps to donn/doff CAM boot, therapist able to donn and doff with supervision, verbal cues provided for technique for feeding velcro through.   Treatment session focused on wheelchair mobility in tight spaces. Therapist self propelled WC with B UE through elevated, around tables next to gift shop, and weaving in and out of cones with wall as obstacle on R and therapist as obstacle on L, verbal cues provided for backing into elevator.   Pt backed up wheelchair and Positioned WC next to mat table for lateral scoot transfer, and donned and doffed leg rests and arm rests with supervision, verbal cues provided for technique and sequencing.   Pt performed Therex to increase B LE strength, therapist providing verbal and tactile cues for proper form to reduce compensation   1x10 SLR B   2x10 supine bridges with B UE on bolster  1x10 sidelying hip abduction bilaterally-pt has difficulty tolerating supine and sidelying position 2/2 back discomfort    2x10 B LAQ   2x10  alternating B seated marching   Positioned WC next to bed and performed lateral scoot transfer WC<>bed with supervision. Pt supine in bed with bed alarm on and needs within reach at end of session.         Therapy Documentation Precautions:  Precautions Precautions: Fall Restrictions Weight Bearing Restrictions: Yes RLE Weight Bearing: Weight bearing as tolerated RLE Partial Weight Bearing Percentage or Pounds: minimal with transfers only LLE Weight Bearing: Non weight bearing  Therapy/Group: Individual Therapy  [facility] [place], [place], DPT  10/02/2022, 8:50 AM

## 2022-10-02 NOTE — Progress Notes (Signed)
Occupational Therapy Session Note  Patient Details  Name: Diana Quinn MRN: [id number] Date of Birth: Nov 11, 1965  Today's Date: 10/02/2022 OT Individual Time: 1005-1059 OT Individual Time Calculation (min): 54 min    Short Term Goals: Week 1:  OT Short Term Goal 1 (Week 1): pt will complete TTB and DABSC transfers with CGA OT Short Term Goal 2 (Week 1): pt will complete LB dressing with AE with set up OT Short Term Goal 3 (Week 1): Pt will perform ADL set up using w/c to gather items with dist s  Skilled Therapeutic Interventions/Progress Updates:    Patient received immediately following PT.  Patient supine in bed with moist heat over sternum.  Patient reporting feeling exhausted after 75 min PT treatment, and Family Education sessions yesterday, and practicing ascending/descending stairs.  Patient requesting to not shower but change clothes.  Patient bathed self while seated at edge of bed.  Patient using lateral lean to don/doff pants over hips and for pericare.   Patient required only set up assistance for sponge bathing.    Problem solved BADL once home.  Patient aware of plan for drop arm commode.  Discussed setting up next to hospital bed as patient does not believe wheelchair will fit into bathroom.  Patient and family plan to turn living room into her bedroom while she is recovering.  Discussed process to empty commode bucket, and basic self care skills.  Daughter in law will aide with meals, laundry - while son will be more responsible for tasks requiring physical aide.    Patient left up in bed with bed alarm engaged and call bell, personal items in reach.    Therapy Documentation Precautions:  Precautions Precautions: Fall Restrictions Weight Bearing Restrictions: Yes RLE Weight Bearing: Weight bearing as tolerated RLE Partial Weight Bearing Percentage or Pounds: minimal with transfers only LLE Weight Bearing: Non weight bearing Pain: Pain Assessment Pain Score: 6  Left  foot/ankle      Therapy/Group: Individual Therapy  Collier Salina 10/02/2022, 12:22 PM

## 2022-10-02 NOTE — Discharge Summary (Signed)
Physician Discharge Summary  Patient ID: Diana Quinn MRN: [id number] DOB/AGE: [birth year] 57 y.o.  Admit date: 09/25/2022 Discharge date: 10/07/2022  Discharge Diagnoses:  Principal Problem:   Bilateral ankle fractures Active Problems:   Type 2 diabetes mellitus with hyperglycemia, without long-term current use of insulin (HCC) DVT prophylaxis Mood stabilization History of seizure disorder History of unstable angina Hypothyroidism Hyperlipidemia Obesity Anemia of chronic disease Hyponatremia/SIADH Constipation Left heel abrasion  Discharged Condition: Stable  Significant Diagnostic Studies: DG Chest 2 View  Result Date: 09/30/2022 CLINICAL DATA:  Chest wall discomfort. EXAM: CHEST - 2 VIEW COMPARISON:  Chest radiograph dated 09/12/2022. FINDINGS: No focal consolidation, pleural effusion, or pneumothorax. The cardiac silhouette is within normal limits. No acute osseous pathology. Degenerative changes of the spine. IMPRESSION: No active cardiopulmonary disease. Electronically Signed   By: Elgie Collard M.D.   On: 09/30/2022 21:18   VAS [place] LOWER EXTREMITY VENOUS (DVT)  Result Date: 09/26/2022  Lower Venous DVT Study Patient Name:  Diana Quinn  Date of Exam:   09/26/2022 Medical Rec #: [id number]      Accession #:    [id number] Date of Birth: November 24, 1965      Patient Gender: F Patient Age:   57 years Exam Location:  [facility] Procedure:      VAS [place] LOWER EXTREMITY VENOUS (DVT) Referring Phys: Mariam Dollar --------------------------------------------------------------------------------  Indications: Swelling.  Risk Factors: None identified. Limitations: Poor ultrasound/tissue interface and Left lower leg cast. Comparison Study: No prior studies. Performing Technologist: Chanda Busing RVT  Examination Guidelines: A complete evaluation includes B-mode imaging, spectral Doppler, color Doppler, and power Doppler as needed of all accessible portions of each vessel. Bilateral  testing is considered an integral part of a complete examination. Limited examinations for reoccurring indications may be performed as noted. The reflux portion of the exam is performed with the patient in reverse Trendelenburg.  +---------+---------------+---------+-----------+----------+--------------+ RIGHT    CompressibilityPhasicitySpontaneityPropertiesThrombus Aging +---------+---------------+---------+-----------+----------+--------------+ CFV      Full           Yes      Yes                                 +---------+---------------+---------+-----------+----------+--------------+ SFJ      Full                                                        +---------+---------------+---------+-----------+----------+--------------+ FV Prox  Full                                                        +---------+---------------+---------+-----------+----------+--------------+ FV Mid   Full                                                        +---------+---------------+---------+-----------+----------+--------------+ FV DistalFull                                                        +---------+---------------+---------+-----------+----------+--------------+  PFV      Full                                                        +---------+---------------+---------+-----------+----------+--------------+ POP      Full           Yes      Yes                                 +---------+---------------+---------+-----------+----------+--------------+ PTV      Full                                                        +---------+---------------+---------+-----------+----------+--------------+ PERO     Full                                                        +---------+---------------+---------+-----------+----------+--------------+   +---------+---------------+---------+-----------+----------+-------------------+ LEFT      CompressibilityPhasicitySpontaneityPropertiesThrombus Aging      +---------+---------------+---------+-----------+----------+-------------------+ CFV      Full           Yes      Yes                                      +---------+---------------+---------+-----------+----------+-------------------+ SFJ      Full                                                             +---------+---------------+---------+-----------+----------+-------------------+ FV Prox  Full                                                             +---------+---------------+---------+-----------+----------+-------------------+ FV Mid   Full                                                             +---------+---------------+---------+-----------+----------+-------------------+ FV DistalFull                                                             +---------+---------------+---------+-----------+----------+-------------------+ PFV      Full                                                             +---------+---------------+---------+-----------+----------+-------------------+  POP      Full           Yes      Yes                                      +---------+---------------+---------+-----------+----------+-------------------+ PTV                                                   Not well visualized +---------+---------------+---------+-----------+----------+-------------------+ PERO                                                  Not well visualized +---------+---------------+---------+-----------+----------+-------------------+     Summary: RIGHT: - There is no evidence of deep vein thrombosis in the lower extremity.  - No cystic structure found in the popliteal fossa.  LEFT: - There is no evidence of deep vein thrombosis in the lower extremity. However, portions of this examination were limited- see technologist comments above.  - No cystic structure found in  the popliteal fossa.  *See table(s) above for measurements and observations. Electronically signed by Sherald Hess MD on 09/26/2022 at 2:13:18 PM.    Final    DG Ankle Complete Left  Result Date: 09/15/2022 CLINICAL DATA:  Ankle fracture and pain. EXAM: LEFT ANKLE COMPLETE - 3+ VIEW COMPARISON:  Left ankle radiograph dated 09/09/2022. FINDINGS: Nondisplaced oblique fracture of the distal tibia. No new fracture. The ankle mortise is intact. Osteophyte along the anterior talus. Overlying cast noted. IMPRESSION: Nondisplaced oblique fracture of the distal tibia status post cast placement. Electronically Signed   By: Elgie Collard M.D.   On: 09/15/2022 15:39   ECHOCARDIOGRAM COMPLETE  Result Date: 09/12/2022    ECHOCARDIOGRAM REPORT   Patient Name:   Diana Quinn Date of Exam: 09/12/2022 Medical Rec #:  [id number]     Height:       63.0 in Accession #:    [id number]    Weight:       181.9 lb Date of Birth:  [birth year]     BSA:          1.857 m Patient Age:    56 years      BP:           134/96 mmHg Patient Gender: F             HR:           79 bpm. Exam Location:  [facility] Procedure: 2D Echo, Cardiac Doppler and Color Doppler Indications:     Dyspnea R06.00  History:         Patient has no prior history of Echocardiogram examinations.                  Previous Myocardial Infarction, TIA; Risk Factors:Hypertension.  Sonographer:     Cristela Blue Referring Phys:  [id number] VIPUL [facility] Diagnosing Phys: Julien Nordmann MD  Sonographer Comments: Technically challenging study due to limited acoustic windows and no apical window. Image acquisition challenging due to patient behavioral factors. IMPRESSIONS  1. Left ventricular ejection fraction, by estimation, is 60 to 65%. The left ventricle has normal function. The left  ventricle has no regional wall motion abnormalities. Left ventricular diastolic parameters are indeterminate.  2. Right ventricular systolic function is normal. The right ventricular size is normal. Tricuspid  regurgitation signal is inadequate for assessing PA pressure.  3. The mitral valve is normal in structure. No evidence of mitral valve regurgitation. No evidence of mitral stenosis.  4. The aortic valve is normal in structure. Aortic valve regurgitation is not visualized. No aortic stenosis is present.  5. The inferior vena cava is normal in size with greater than 50% respiratory variability, suggesting right atrial pressure of 3 mmHg. FINDINGS  Left Ventricle: Left ventricular ejection fraction, by estimation, is 60 to 65%. The left ventricle has normal function. The left ventricle has no regional wall motion abnormalities. The left ventricular internal cavity size was normal in size. There is  no left ventricular hypertrophy. Left ventricular diastolic parameters are indeterminate. Right Ventricle: The right ventricular size is normal. No increase in right ventricular wall thickness. Right ventricular systolic function is normal. Tricuspid regurgitation signal is inadequate for assessing PA pressure. Left Atrium: Left atrial size was normal in size. Right Atrium: Right atrial size was normal in size. Pericardium: There is no evidence of pericardial effusion. Mitral Valve: The mitral valve is normal in structure. No evidence of mitral valve regurgitation. No evidence of mitral valve stenosis. Tricuspid Valve: The tricuspid valve is normal in structure. Tricuspid valve regurgitation is not demonstrated. No evidence of tricuspid stenosis. Aortic Valve: The aortic valve is normal in structure. Aortic valve regurgitation is not visualized. No aortic stenosis is present. Pulmonic Valve: The pulmonic valve was normal in structure. Pulmonic valve regurgitation is not visualized. No evidence of pulmonic stenosis. Aorta: The aortic root is normal in size and structure. Venous: The inferior vena cava is normal in size with greater than 50% respiratory variability, suggesting right atrial pressure of 3 mmHg. IAS/Shunts: No  atrial level shunt detected by color flow Doppler.  LEFT VENTRICLE PLAX 2D LVIDd:         4.90 cm LVIDs:         3.50 cm LV PW:         1.10 cm LV IVS:        0.90 cm LVOT diam:     2.10 cm LVOT Area:     3.46 cm  LEFT ATRIUM         Index LA diam:    2.80 cm 1.51 cm/m   AORTA Ao Root diam: 2.80 cm TRICUSPID VALVE TR Peak grad:   10.5 mmHg TR Vmax:        162.00 cm/s  SHUNTS Systemic Diam: 2.10 cm Julien Nordmann MD Electronically signed by Julien Nordmann MD Signature Date/Time: 09/12/2022/12:26:20 PM    Final    CT Angio Chest Pulmonary Embolism (PE) W or WO Contrast  Result Date: 09/12/2022 CLINICAL DATA:  Leg fracture.  Concern for pulmonary embolism. EXAM: CT ANGIOGRAPHY CHEST WITH CONTRAST TECHNIQUE: Multidetector CT imaging of the chest was performed using the standard protocol during bolus administration of intravenous contrast. Multiplanar CT image reconstructions and MIPs were obtained to evaluate the vascular anatomy. RADIATION DOSE REDUCTION: This exam was performed according to the departmental dose-optimization program which includes automated exposure control, adjustment of the mA and/or kV according to patient size and/or use of iterative reconstruction technique. CONTRAST:  75mL OMNIPAQUE IOHEXOL 350 MG/ML SOLN COMPARISON:  None Available. FINDINGS: Cardiovascular: No filling defects within the pulmonary arteries to suggest acute pulmonary embolism. Mediastinum/Nodes: No axillary or supraclavicular  adenopathy. No mediastinal or hilar adenopathy. No pericardial fluid. Esophagus normal. Lungs/Pleura: No pulmonary infarction. No pneumonia. No pleural fluid. No pneumothorax Upper Abdomen: Surgical staples in the gastric cardia. No acute findings Musculoskeletal: With chronic lumbar compression fracture. No acute findings Review of the MIP images confirms the above findings. IMPRESSION: 1. No evidence acute pulmonary embolism. 2. No acute pulmonary findings. 3. Chronic lumbar compression fracture.  Electronically Signed   By: Genevive Bi M.D.   On: 09/12/2022 10:53   DG Chest Port 1 View  Result Date: 09/12/2022 CLINICAL DATA:  Dyspnea. EXAM: PORTABLE CHEST 1 VIEW COMPARISON:  12/25/2021 FINDINGS: Cardiac silhouette is normal in size. No mediastinal or hilar masses. Clear lungs.  No pleural effusion or pneumothorax. Skeletal structures are grossly intact. IMPRESSION: No active disease. Electronically Signed   By: Amie Portland M.D.   On: 09/12/2022 10:08   DG Abd 1 View  Result Date: 09/12/2022 CLINICAL DATA:  Abdominal pain, vomiting EXAM: ABDOMEN - 1 VIEW COMPARISON:  CT abdomen/pelvis dated 09/24/2018 FINDINGS: Nonobstructive bowel gas pattern. Scattered surgical clips throughout the abdomen/pelvis, with a gastric suture line in the left upper quadrant. Visualized osseous structures are within normal limits. Lung bases are clear. IMPRESSION: Nonobstructive bowel gas pattern. Electronically Signed   By: Charline Bills M.D.   On: 09/12/2022 01:41   CT Ankle Left Wo Contrast  Result Date: 09/10/2022 CLINICAL DATA:  Ankle trauma, fracture, xray done (Age >= 5y) EXAM: CT OF THE LEFT ANKLE WITHOUT CONTRAST TECHNIQUE: Multidetector CT imaging of the left ankle was performed according to the standard protocol. Multiplanar CT image reconstructions were also generated. RADIATION DOSE REDUCTION: This exam was performed according to the departmental dose-optimization program which includes automated exposure control, adjustment of the mA and/or kV according to patient size and/or use of iterative reconstruction technique. COMPARISON:  Plain films today FINDINGS: Bones/Joint/Cartilage There is an oblique fracture through the distal tibial metaphysis. No visible fibular fracture. Ankle mortise appears intact. Fracture through the anterior process of the calcaneus. Well corticated bone fragments noted along the anterior surface of the distal talus. Ligaments Suboptimally assessed by CT. Muscles and  Tendons Grossly unremarkable Soft tissues Soft tissue swelling in the lower calf and ankle region. IMPRESSION: Oblique fracture through the distal tibial metaphysis. Minimally displaced fracture through the anterior process of the calcaneus. Electronically Signed   By: Charlett Nose M.D.   On: 09/10/2022 00:15   CT Ankle Right Wo Contrast  Result Date: 09/10/2022 CLINICAL DATA:  Ankle pain, stress fracture suspected, neg xray. Fall. EXAM: CT OF THE RIGHT ANKLE WITHOUT CONTRAST TECHNIQUE: Multidetector CT imaging of the right ankle was performed according to the standard protocol. Multiplanar CT image reconstructions were also generated. RADIATION DOSE REDUCTION: This exam was performed according to the departmental dose-optimization program which includes automated exposure control, adjustment of the mA and/or kV according to patient size and/or use of iterative reconstruction technique. COMPARISON:  None Available. FINDINGS: Bones/Joint/Cartilage There is an oblique nondisplaced fracture through the right medial malleolus. Multiple small bone fragments adjacent to the tip of the lateral malleolus and lateral talus, many of which appear well corticated and chronic, but others are likely acute. Widening of the lateral ankle mortise. Nondisplaced fracture noted through the distal calcaneus laterally near the calcaneocuboid joint. Ligaments Suboptimally assessed by CT. Muscles and Tendons Grossly unremarkable. Soft tissues Soft tissue swelling diffusely, most pronounced laterally. IMPRESSION: Nondisplaced fracture through the medial malleolus and distal lateral calcaneus. Probable avulsed fragments off the lateral malleolus  and/or lateral talus. Widening of the lateral ankle mortise. Electronically Signed   By: Charlett Nose M.D.   On: 09/10/2022 00:12   DG Ankle Complete Right  Result Date: 09/09/2022 CLINICAL DATA:  Mechanical fall.  Ankle pain and deformity. EXAM: RIGHT ANKLE - COMPLETE 3+ VIEW COMPARISON:  None  Available. FINDINGS: There is no evidence of fracture, dislocation, or joint effusion. Small osseous fragment about the inferior aspect of the lateral malleolus, likely a chronic process. Osteophytes about the talonavicular joint suggesting arthropathy. Prominent plantar calcaneal spurring. Marked soft tissue swelling about lateral malleolus. IMPRESSION: Marked soft tissue swelling about the lateral malleolus without evidence of acute fracture or dislocation. Degenerative changes of the talonavicular joint. Electronically Signed   By: Larose Hires D.O.   On: 09/09/2022 22:21   DG Ankle Complete Left  Result Date: 09/09/2022 CLINICAL DATA:  Left ankle pain/deformity. EXAM: LEFT ANKLE COMPLETE - 3+ VIEW COMPARISON:  None Available. FINDINGS: There is oblique mildly displaced fracture of the distal tibia. No other appreciable fracture or dislocation. Osteophyte about the anterior aspect of the talus concerning for anterior ankle impingement. Marked soft tissue swelling about anteromedial aspect of the distal leg. IMPRESSION: 1. Oblique mildly displaced fracture of the distal tibia. 2. Osteophyte about the anterior aspect of the talus concerning for anterior ankle impingement. 3. Marked soft tissue swelling about the anteromedial aspect of the distal leg. Electronically Signed   By: Larose Hires D.O.   On: 09/09/2022 22:17    Labs:  Basic Metabolic Panel: Recent Labs  Lab 09/26/22 0500 09/29/22 0700 09/30/22 0737 10/01/22 0704 10/02/22 0710  NA 133* 130* 128* 128* 127*  K 4.5 4.1 4.3 4.2 3.8  CL 101 99 97* 92* 94*  CO2 23 26 23 26 24   GLUCOSE 92 64* 233* 116* 109*  BUN 11 11 13 11 12   CREATININE 0.71 0.62 0.71 0.70 0.68  CALCIUM 8.5* 8.0* 8.1* 8.5* 8.2*    CBC: Recent Labs  Lab 09/26/22 0500 09/29/22 0700  WBC 7.4 6.2  NEUTROABS 3.8  --   HGB 11.5* 9.9*  HCT 34.9* 30.9*  MCV 100.6* 98.1  PLT 587* 506*    CBG: Recent Labs  Lab 09/30/22 1624 09/30/22 2055 10/01/22 0606  10/01/22 1152 10/02/22 0810  GLUCAP 104* 119* 106* 119* 243*   Family history.  Mother with hypertension and COPD Brother with epilepsy.  Denies any colon cancer or esophageal cancer or rectal cancer Brief HPI:   Diana Quinn is a 57 y.o. right-handed female with history of PTSD/bipolar/schizophrenia/anxiety maintained on Caplyta, type 2 diabetes mellitus, unstable angina, CAD with MI with nonobstructive CAD, seizure disorder history of gastric bypass for obesity, TIA 04/22/2017.  Per chart review lives with her son and daughter-in-law.  Family assistance as needed.  Independent without assistive device but does endorse 4-5 falls in the past 6 months.  She had been sleeping on an air mattress.  Presented 09/09/2022 after mechanical fall without loss of consciousness.  Patient reports both of her ankle rolled inward and she heard a pop with both ankles.  X-rays and imaging revealed left distal tibia fracture nondisplaced and right nondisplaced ankle fracture.  Podiatry service Dr. Nicholes Rough consulted no surgical plans indicated.  Left lower extremity below-knee fiberglass cast applied 4 to 6 weeks.  Per latest podiatry note 09/21/2022 okay for minimal weightbearing right lower extremity in cam walker.  Bathroom and transition purposes only.  Nonweightbearing left lower extremity.  Lovenox for DVT prophylaxis transitioning to Eliquis  on discharge.  Patient did have Xeroform applied to superficial left heel abrasion.  Hospital course follow-up psychiatry services.  Urine culture 09/15/2019 for E. coli completed antibiotic course.  Palliative care consulted for goals of care.  Therapy evaluations completed due to patient decreased functional mobility was admitted for comprehensive rehab program.   Hospital Course: Diana Quinn was admitted to rehab 09/25/2022 for inpatient therapies to consist of PT, ST and OT at least three hours five days a week. Past admission physiatrist, therapy team and rehab RN have worked  together to provide customized collaborative inpatient rehab.  Pertaining to patient's left distal tibia fracture nondisplaced and right nondisplaced ankle fracture.  Followed by podiatry service Dr. Nicholes Rough.  Minimal weightbearing right lower extremity in cam walker.  Bathroom and transition purposes only.  Nonweightbearing left lower extremity with below-knee fiberglass cast applied.  Neurovascular sensation intact.  Lovenox for DVT prophylaxis transition to Eliquis on discharge x 30 days venous Doppler studies negative.  Pain managed with use of Zanaflex scheduled oxycodone for breakthrough pain.  Patient with long history of PTSD schizophrenia she remained on Caplyta, Paxil, Trileptal and trazodone as well as Seroquel nightly.  History of seizure disorder she continues on Trileptal as advised no seizure activity.  History of unstable angina with Ranexa no chest pain or shortness of breath.  Hypothyroidism with hormone supplement.  Lipitor for hyperlipidemia.  Noted obesity history of gastric bypass dietary follow-up BMI 36.05.  Anemia of chronic disease maintained on iron supplement with latest hemoglobin 9.9 and monitored.  Persistent hyponatremia 127-130 follow-up per hospitalist team fluid restriction 1500 ml with sodium tablets as directed.  Urine sodium 195 and osmolality 270-286 and labs suggestive of SIADH.  Patient would follow-up with PCP on monitoring of sodium levels.   Blood pressures were monitored on TID basis and controlled  Diabetes has been monitored with ac/hs CBG checks and SSI was use prn for tighter BS control.    Rehab course: During patient's stay in rehab weekly team conferences were held to monitor patient's progress, set goals and discuss barriers to discharge. At admission, patient required minimal assist sit to stand minimal assist lateral scoot transfers  Physical exam.  Blood pressure 116/65 pulse 73 temperature 98 respirations 20 oxygen saturation is 100% room  air Constitutional.  No acute distress HEENT Head.  Normocephalic and atraumatic Eyes.  Pupils round and reactive to light no discharge without nystagmus Neck.  Supple nontender no JVD without thyromegaly Cardiac regular rate and rhythm without any extra sounds or murmur heard Abdomen.  Soft nontender positive bowel sounds without rebound Respiratory effort normal no respiratory distress without wheeze Motor exam.  Strength 5/5 throughout bilateral upper extremities.  Bilateral lower extremity antigravity against resistance with hip flexors knee extension can wiggle bilateral toes Neurologic.  Alert and oriented x 3   He/She  has had improvement in activity tolerance, balance, postural control as well as ability to compensate for deficits. He/She has had improvement in functional use RUE/LUE  and RLE/LLE as well as improvement in awareness.  Perform supine to sit modified independent lateral scoot transfers wheelchair to bed with supervision modified independent.  A sensed and descends seven 6 inch steps with shower chair method.  Performed lateral scoot transfers wheelchair to and from shower chair with contact-guard.  Stand pivot transfer wheelchair to elevated mat to simulate SUV height with contact-guard.  Requires close supervision for toilet transfers.  Patient able to complete PERI care with set up only.  Clothing  management with rolling walker and grab bar with contact-guard to stand.  Full family teaching completed plan discharge to home       Disposition: Discharged to home    Diet: Regular/1500 ml   Special Instructions: No driving smoking or alcohol  Minimal weightbearing right lower extremity in cam walker.  Bathroom and transition purposes only.  Nonweightbearing left lower extremity  Follow-up with PCP on monitoring of sodium levels  Medications at discharge. 1.  Tylenol as needed 2.  Lipitor 40 mg p.o. daily 3.  Vitamin D2 1.25 mcg by mouth weekly 4.  Vitamin D 1000  units p.o. daily 5.  Ferrous sulfate 325 mg p.o. daily with breakfast 6.  Atarax 25 mg p.o. 3 times daily as needed anxiety 7.  Synthroid 112 mcg p.o. daily 8.  Lidoderm patch changes directed 9.  Caplyta 42 mg p.o. daily 10.  Trazodone 300 mg p.o. nightly 11.  Multivitamin daily 12.  Trileptal 600 mg p.o. twice daily 13.  Oxycodone immediate release 10 mg every 4 hours as needed severe pain 14.  Protonix 40 mg p.o. daily 15.  Paxil 40 mg p.o. daily 16.  MiraLAX daily hold for loose stools 17.  Minipress 2 mg p.o. nightly 18.  Seroquel 300 mg p.o. nightly 19.  Ranexa 1000 mg p.o. twice daily 20.  Zanaflex 4 mg p.o. 3 times daily 21.  Eliquis 2.5 mg p.o. twice daily x 30 days. 22.  Vitamin B12 1000 mcg into the muscle every 30 days 23.  Sodium chloride tablets 1 g 3 times daily  30-35 minutes were spent completing discharge summary and discharge planning      Follow-up Information     Fanny Dance, MD Follow up.   Specialty: Physical Medicine and Rehabilitation Why: No formal follow-up needed Contact information: [address] [place] [address] [phone]         Candelaria Stagers, DPM Follow up.   Specialty: Podiatry Why: Call for appointment Contact information: [address] [place] [address] [phone]                 Signed: Charlton Amor 10/02/2022, 10:43 AM

## 2022-10-02 NOTE — Consult Note (Signed)
Initial Consultation Note   Patient: Diana Quinn HYQ:[id number] DOB: 13-Apr-1965 PCP: Orson Eva, NP DOA: 09/25/2022 DOS: the patient was seen and examined on 10/02/2022 Primary service: Fanny Dance, MD  Referring physician: Fanny Dance Reason for consult: Hyponatremia  Assessment/Plan: Assessment and Plan:   Hyponatremia Acute.  Patient noted to have slow downtrend in sodium to 127 despite recently been started on salt tablets and 1200 mL fluid restriction.  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  Check urine sodium(59), urine osmolarity(831), serum osmolarity(270). -Strict intake and output -Will discontinue salt tablets -Loosen fluid restriction to 1500 mL/day -Trial of low-dose lasix 20 mg daily -Recheck sodium levels in a.m.  [facility] will continue to follow the patient.  HPI: Diana Quinn is a 57 y.o. female with past medical history of CAD, TIA, diabetes mellitus type 2, history of seizure, hypothyroidism, PTSD, bipolar, schizophrenia, anxiety, and obesity s/p gastric bypass who was sent to inpatient rehab after having a fall with left distal tibial fracture undisplaced and right nondisplaced ankle fracture.Dr. Nicholes Rough of podiatry was consulted no surgical plans indicated. Left lower extremity below-knee fiberglass cast applied x 4 to 6 weeks.  She reports no recent changes in medications except for clonazepam was changed to hydroxyzine.  She has been on the same psych medication regimen for quite some time and had never been told that her sodium levels are significantly low in the past.  Review of records note sodium levels trending down to 127 despite recent changes including fluid restriction of 1200 mL/day and 1 g sodium tablets ordered 3 times daily.  Review of Systems: As mentioned in the history of present illness. All other systems reviewed and are negative. Past Medical History:  Diagnosis Date   Anemia    Elevated lithium level 05/10/2014   Gastric bypass status  for obesity    H/O unstable angina    Hx SBO 09/30/2014   Hypoglycemia    MI (myocardial infarction) (HCC)    Orthostatic hypotension    Osteoporosis    Sciatic nerve disease    Seizures (HCC)    Thyroid disease    TIA (transient ischemic attack) 04/22/2017   Past Surgical History:  Procedure Laterality Date   ABDOMINAL ADHESION SURGERY     ABDOMINAL HYSTERECTOMY     CHOLECYSTECTOMY     DILATION AND CURETTAGE OF UTERUS     ESOPHAGOGASTRODUODENOSCOPY     GASTRIC BYPASS     GASTRIC BYPASS OPEN     revision   GASTROSTOMY W/ FEEDING TUBE     HERNIA REPAIR     OOPHORECTOMY     SPLENECTOMY     TONSILLECTOMY     Social History:  reports that she has never smoked. She has never used smokeless tobacco. She reports that she does not currently use alcohol. She reports that she does not use drugs.  Allergies  Allergen Reactions   Depakote [Divalproex Sodium] Shortness Of Breath   Pregabalin Other (See Comments), Rash and Shortness Of Breath    Other Reaction: falls; decreased coordination  Pt ts that she passes out when she takes this med.   Other Reaction: falls; decreased coordination    Pt ts that she passes out when she takes this med.  Other Reaction: falls; decreased coordination, , Pt ts that she passes out when she takes this med.   Valproic Acid Other (See Comments) and Shortness Of Breath    Other Reaction: Other reaction  SOB   Levetiracetam Itching and Rash    Family  History  Problem Relation Age of Onset   Cancer Mother    Hypertension Mother    COPD Mother    Asthma Sister    Psoriasis Sister    Epilepsy Brother     Prior to Admission medications   Medication Sig Start Date End Date Taking? Authorizing Provider  acetaminophen (TYLENOL) 325 MG tablet Take 2 tablets (650 mg total) by mouth every 6 (six) hours as needed for mild pain or moderate pain. 09/25/22   Alford Highland, MD  atorvastatin (LIPITOR) 40 MG tablet Take 1 tablet (40 mg total) by mouth  daily. Patient not taking: Reported on 09/09/2022 08/25/22   Miki Kins, FNP  CAPLYTA 42 MG capsule Take 42 mg by mouth daily.    [provider]  cyanocobalamin (VITAMIN B12) 1000 MCG/ML injection Inject 1 mL (1,000 mcg total) into the muscle every 30 (thirty) days. 08/25/22   Miki Kins, FNP  enoxaparin (LOVENOX) 40 MG/0.4ML injection Inject 0.4 mLs (40 mg total) into the skin daily. 09/25/22 10/25/22  Alford Highland, MD  ergocalciferol (VITAMIN D2) 1.25 MG (50000 UT) capsule Take 1.25 mcg by mouth once a week.  02/08/18   [provider]  ferrous sulfate 325 (65 FE) MG tablet Take 1 tablet (325 mg total) by mouth daily with breakfast. 09/26/22   Alford Highland, MD  levothyroxine (SYNTHROID) 112 MCG tablet Take 112 mcg by mouth daily before breakfast. 04/08/22   [provider]  melatonin 5 MG TABS Take 1 tablet (5 mg total) by mouth at bedtime as needed. 09/25/22   Alford Highland, MD  Multiple Vitamin (MULTIVITAMIN WITH MINERALS) TABS tablet Take 1 tablet by mouth daily. 09/26/22   Alford Highland, MD  naloxone [facility]) nasal spray 4 mg/0.1 mL as needed. As needed 07/16/22   [provider]  oxcarbazepine (TRILEPTAL) 600 MG tablet Take 1 tablet (600 mg total) by mouth 2 (two) times daily. Pt states she only takes 600mg  twice a day per neurologist 09/25/22   Alford Highland, MD  oxyCODONE 10 MG TABS Take 1 tablet (10 mg total) by mouth every 6 (six) hours as needed for severe pain. 09/25/22   Alford Highland, MD  pantoprazole (PROTONIX) 40 MG tablet Take 40 mg by mouth daily.    [provider]  PARoxetine (PAXIL) 40 MG tablet Take 40 mg by mouth daily. 07/16/22   [provider]  prazosin (MINIPRESS) 2 MG capsule Take 2-4 mg by mouth at bedtime.    [provider]  QUEtiapine (SEROQUEL) 100 MG tablet Take 3 tablets (300 mg total) by mouth at bedtime. 09/25/22   Alford Highland, MD  ranolazine (RANEXA) 1000 MG SR tablet Take  1,000 mg by mouth 2 (two) times daily. 01/21/20   [provider]  thiamine (VITAMIN B-1) 100 MG tablet Take 1 tablet (100 mg total) by mouth daily. 09/26/22   Alford Highland, MD  tiZANidine (ZANAFLEX) 4 MG tablet Take 1 tablet (4 mg total) by mouth 3 (three) times daily. 09/25/22 10/25/22  Alford Highland, MD  trazodone (DESYREL) 300 MG tablet Take 300 mg by mouth at bedtime. 08/07/22   [provider]    Physical Exam: Vitals:   10/01/22 1524 10/01/22 2013 10/01/22 2202 10/02/22 0454  BP: 121/86 (!) 97/57 101/61 117/76  Pulse: 78 68  77  Resp: 17 18  18   Temp: 98.3 F (36.8 C) 97.8 F (36.6 C)  97.8 F (36.6 C)  TempSrc: Oral Oral    SpO2: 98% 100%  100%  Weight:      Height:        Constitutional: Middle-aged female currently no acute distress Eyes: PERRL, lids and conjunctivae normal ENMT: Mucous membranes are moist.   Neck: normal, supple  Respiratory: clear to auscultation bilaterally, no wheezing, no crackles. Normal respiratory effort. No accessory muscle use.  Cardiovascular: Regular rate and rhythm, no murmurs / rubs / gallops.   Abdomen: no tenderness, no masses palpated. No hepatosplenomegaly. Bowel sounds positive.  Musculoskeletal: no clubbing / cyanosis.  Cast on left leg and brace appreciated on the right leg. Skin: no rashes, lesions, ulcers. No induration Neurologic: CN 2-12 grossly intact.  Strength intact of the bilateral upper extremities not tested lower extremities due to cast. Psychiatric: Normal judgment and insight. Alert and oriented x 3. Normal mood.   Data Reviewed:   Reviewed labs and pertinent medical history   Family Communication:  Primary team communication:  Thank you very much for involving [place] in the care of your patient.  Author: Clydie Braun, MD 10/02/2022 9:46 AM  For on call review [email or web address].

## 2022-10-03 DIAGNOSIS — K5900 Constipation, unspecified: Secondary | ICD-10-CM

## 2022-10-03 DIAGNOSIS — E876 Hypokalemia: Secondary | ICD-10-CM

## 2022-10-03 DIAGNOSIS — Z794 Long term (current) use of insulin: Secondary | ICD-10-CM

## 2022-10-03 DIAGNOSIS — R0789 Other chest pain: Secondary | ICD-10-CM

## 2022-10-03 HISTORY — DX: Hypokalemia: E87.6

## 2022-10-03 LAB — BASIC METABOLIC PANEL
Anion gap: 11 (ref 5–15)
BUN: 19 mg/dL (ref 6–20)
CO2: 20 mmol/L — ABNORMAL LOW (ref 22–32)
Calcium: 8.2 mg/dL — ABNORMAL LOW (ref 8.9–10.3)
Chloride: 104 mmol/L (ref 98–111)
Creatinine, Ser: 0.8 mg/dL (ref 0.44–1.00)
GFR, Estimated: >60 mL/min (ref >60)
Glucose, Bld: 125 mg/dL — ABNORMAL HIGH (ref 70–99)
Potassium: 5.5 mmol/L — ABNORMAL HIGH (ref 3.5–5.1)
Sodium: 135 mmol/L (ref 135–145)

## 2022-10-03 LAB — GLUCOSE, CAPILLARY
Glucose-Capillary: 264 mg/dL — ABNORMAL HIGH (ref 70–99)
Glucose-Capillary: 109 mg/dL — ABNORMAL HIGH (ref 70–99)

## 2022-10-03 MED ORDER — MELATONIN 5 MG PO TABS: 5.0000 mg | ORAL_TABLET | Freq: Every evening | ORAL | Status: DC | PRN

## 2022-10-03 MED ORDER — ZOLPIDEM TARTRATE 5 MG PO TABS
5.0000 mg | ORAL_TABLET | Freq: Every evening | ORAL | Status: DC | PRN
  Administered 2022-10-03 – 2022-10-06 (×4): 5 mg via ORAL
  Filled 2022-10-03 (×4): qty 1

## 2022-10-03 MED ORDER — SODIUM ZIRCONIUM CYCLOSILICATE 5 G PO PACK
5.0000 g | PACK | Freq: Once | ORAL | Status: AC
  Administered 2022-10-03: 5 g via ORAL
  Filled 2022-10-03: qty 1

## 2022-10-03 NOTE — Progress Notes (Signed)
PROGRESS NOTE   Subjective/Complaints:  Patient feels tired this morning.  She reports her son was not able to get Ambien yesterday and did not have good sleep.  Hospitalist following for hyponatremia/SIADH.  She was also noted to be hyperkalemic this morning.  ROS: as per HPI.  SOB, abd pain, N/V/D, or any other complaints at this time.   +LLE pain + chest wall soreness +constipation-improved + Fatigue  Objective:   No results found. No results for input(s): "WBC", "HGB", "HCT", "PLT" in the last 72 hours.  Recent Labs    10/02/22 0710 10/03/22 0553  NA 127* 134*  K 3.8 5.2*  CL 94* 100  CO2 24 22  GLUCOSE 109* 89  BUN 12 18  CREATININE 0.68 0.64  CALCIUM 8.2* 8.3*     Intake/Output Summary (Last 24 hours) at 10/03/2022 0823 Last data filed at 10/03/2022 9242 Gross per 24 hour  Intake 944 ml  Output 1100 ml  Net -156 ml        Physical Exam: Vital Signs Blood pressure 115/64, pulse 77, temperature 97.7 F (36.5 C), resp. rate 17, height 5\' 3"  (1.6 m), weight 93.8 kg, SpO2 97%.   General: Obese, No apparent distress HEENT: Head is normocephalic, atraumatic, MMM Heart: Reg rate and rhythm. No murmurs rubs or gallops Chest: CTA bilaterally, nonlabored   + chest wall soreness replicating her pain, tender around sternum- continued Abdomen: Soft, non-tender, non-distended, bowel sounds normoactive Extremities: Right lower extremity in Cam walker, left lower extremity short leg cast in place Psych: Pt's affect is appropriate. Pt is cooperative Skin: Warm and dry  Neuro: alert and awake, cranial nerves II through XII grossly intact, normal speech and language Strength 5 out of 5 in bilateral upper extremities Able to lift bilateral lower extremities to gravity with hip flexion and knee extension Able to wiggle her toes bilaterally  Assessment/Plan: 1. Functional deficits which require 3+ hours per day of  interdisciplinary therapy in a comprehensive inpatient rehab setting. Physiatrist is providing close team supervision and 24 hour management of active medical problems listed below. Physiatrist and rehab team continue to assess barriers to discharge/monitor patient progress toward functional and medical goals  Care Tool:  Bathing    Body parts bathed by patient: Right arm, Left arm, Chest, Abdomen, Front perineal area, Buttocks, Right upper leg, Left upper leg, Face   Body parts bathed by helper: Right lower leg Body parts n/a: Left lower leg   Bathing assist Assist Level: Moderate Assistance - Patient 50 - 74%     Upper Body Dressing/Undressing Upper body dressing   What is the patient wearing?: Pull over shirt    Upper body assist Assist Level: Set up assist    Lower Body Dressing/Undressing Lower body dressing      What is the patient wearing?: Pants     Lower body assist Assist for lower body dressing: Moderate Assistance - Patient 50 - 74%     Toileting Toileting    Toileting assist Assist for toileting: Maximal Assistance - Patient 25 - 49%     Transfers Chair/bed transfer  Transfers assist  Chair/bed transfer activity did not occur: Safety/medical concerns  Chair/bed transfer assist level: Supervision/Verbal cueing (lateral transfer)     Locomotion Ambulation   Ambulation assist   Ambulation activity did not occur: Safety/medical concerns          Walk 10 feet activity   Assist  Walk 10 feet activity did not occur: Safety/medical concerns        Walk 50 feet activity   Assist Walk 50 feet with 2 turns activity did not occur: Safety/medical concerns         Walk 150 feet activity   Assist Walk 150 feet activity did not occur: Safety/medical concerns         Walk 10 feet on uneven surface  activity   Assist Walk 10 feet on uneven surfaces activity did not occur: Safety/medical concerns          Wheelchair     Assist Is the patient using a wheelchair?: Yes Type of Wheelchair: Manual    Wheelchair assist level: Independent, Supervision/Verbal cueing Max wheelchair distance: 152ft    Wheelchair 50 feet with 2 turns activity    Assist        Assist Level: Independent   Wheelchair 150 feet activity     Assist      Assist Level: Independent   Blood pressure 115/64, pulse 77, temperature 97.7 F (36.5 C), resp. rate 17, height 5\' 3"  (1.6 m), weight 93.8 kg, SpO2 97%.  Medical Problem List and Plan: 1. Functional deficits secondary to left distal tibia fracture nondisplaced and right nondisplaced ankle fracture.  Followed by podiatry services Dr. Nicholes Rough.  By latest note 09/21/2022 podiatry services minimal weightbearing right lower extremity in cam walker.  Bathroom and transition purposes only.  Nonweightbearing left lower extremity with below-knee fiberglass cast applied             -patient may shower -ELOS/Goals: modified independent PT, modified independent OT, 10-12 days - NWB LLE 4-6 weeks, minimal WB RLE in CAM walker w/ walker (Bathroom and transition purposes only) per podiatry; will need to clarify with Podiatry duration for RLE restrictions- messaged podiatry   -Continue CIR -Expected discharge 7/30   2.  Antithrombotics: -DVT/anticoagulation:  Pharmaceutical: Lovenox 40mg  QD, DVT U/S neg but limited             -antiplatelet therapy: N/A 3. Pain Management: Zanaflex 4 mg 3 times daily, oxycodone/Advil/tylenol as needed  -7/19 continue oxycodone for lower extremity pain 4. Mood/Behavior/Sleep: History of PTSD anxiety with schizophrenia.  Continue Caplyta 42mg  QD, Paxil 40 mg daily, trazodone 300 mg nightly, Atarax as needed             - antipsychotic agents: Seroquel 300 mg nightly - takes Belsomra 20 mg at bedtime at home; not on formulary, would discuss with patient bringing from home  -7/19 patient reports she slept better last night  however she would like to restart Belsomra.  Will see if patient can have her family member bring in her medication for use in the hospital.  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  If son brings Belsomra we can switch to this medication instead. 5. Neuropsych/cognition: This patient is capable of  making decisions on her own behalf. 6. Skin/Wound Care: Xeroform over left heel wound 7. Fluids/Electrolytes/Nutrition: Routine and analysis with follow-up chemistries 8.  History of seizure disorder.  Trileptal 600 mg twice daily 9.  History of unstable angina.  Ranexa 1000 mg twice daily, Minipress 2 mg nightly 10.  Hypothyroidism.  Synthroid QD 11.  Hyperlipidemia.  Lipitor 40mg  QD 12.  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  Follow-up dietary 13.  History of alcohol use.  Counseling. Patient denies currently.  14.  Anemia of chronic disease.  Continue iron supplement.  Follow-up CBC  -7/19 Hgb improved to 11.5 continue to monitor  7/22 overall stable hgb at 9.9, continue to follow with weekly labs 15.  Thrombocytosis.  Likely reactive, recheck Monday 16.  Hyponatremia mild.  Appears to be occurring intermittently on chronic basis, recheck Monday  -7/22 start salt tabs BID, recheck tomorrow  -7/23 fluid restriction, continue to monitor sodium level  -7/24 BMP daily, increase salt tab to TID  -7/25 consulted internal medicine for hyponatremia,  -7/26 sodium up to 134 and later 135, internal medicine continuing Lasix 20 mg p.o. daily and mild fluid restriction, appreciate assistance 17. DM2 with hypoglycemia?,  Patient  reports she did not have diabetes mellitus but instead was treated for postprandial hypoglycemia after gastric bypass surgery: ?previously on Acarbose 100mg  TID (unclear if patient was taking this) -09/27/22 CBGs variable and episode of hypoglycemia today; continue CBG monitoring and SSI, monitor -7/24 discontinue SSI, continue CBG monitoring but decreased to BID -7/26 few elevated CBGs, continue to monitor  CBG (last 3)  Recent Labs    10/02/22 0810 10/02/22 2242 10/03/22 0810  GLUCAP 243* 163* 264*    18. Hx of vitamin D deficiency and hypocalcemia, now with pathologic fx: likely needs DEXA scan, appears that last Vit D level was in 2020 after appropriate replacement therapy, and then was on some form of Calcium-Vit D supplement (500mg  [1250mg ]-200IU dosing?) but had come off it at some point. Calcium low at 8.5 here at admission. Will recheck Vitamin D with Monday labs on 09/29/22, and start on calcium carbonate supplementation 1250mg  every day starting today 7/20, suspect she'll need some form of vitamin D supplementation (daily vs weekly).   -7/23 Vit D 30.14 borderline, will start 1000u daily  19. Rib/sternum pain: hx of sternal fx from fall per PT -09/28/22 felt a pop while supporting her weight, thinks it's a pulled muscle, feels better upon eval, no deformity or significant tenderness; doubt need for imaging or work up, likely muscular or rib related; will order kpad and lidoderm PRN. Monitor. Activity as tolerated.  -7/22 rib pain improved with interventions above  -7/23 chest wall tenderness today, will increase oxycodone frequency to 10mg  Q4h PRN, will check CXR -7/24 CXR unremarkable, continue current management -7/25-26 continue current pain medication, controlled overall 20. Constipation  -7/24 start miralax and senokot daily,sorbitol PRN  -7/26 last BM yesterday, extra-large continue to monitor 21.  Hyperkalemia  -7/26 internal medicine has ordered Lokelma, continue to  monitor  LOS: 8 days A FACE TO FACE EVALUATION WAS PERFORMED  Fanny Dance 10/03/2022, 8:23 AM

## 2022-10-03 NOTE — Progress Notes (Signed)
[facility] PROGRESS NOTE  Diana Quinn (DOB: Jun 16, 1965) ZOX:[id number] PCP: Orson Eva, NP  Brief Narrative: Diana Quinn is a 57 y.o. female with a history of CAD, TIA, T2DM, obesity s/p gastric bypass, hypothyroidism, seizure disorder, schizophrenia/mood disorder who was admitted to inpatient rehab after having a fall with left distal tibial fracture undisplaced and right nondisplaced ankle fracture. Podiatry recommended against surgery, had Left below knee cast applied and was admitted to CIR for rehabilitation. Sodium trended downward, not responsive to fluid restriction, salt tablets, so hospitalist service consulted for hyponatremia.   Subjective: She reports her left ankle pain remains uncontrolled, though improved with meds never below 5/10. R ankle starting to hurt a bit more since she's bearing more weight on it as well. No known history of hyponatremia. Been eating ok, drinking with water additives for taste. Urine has been dark but today lighter. No HA, seizures, tremor, confusion.  Objective: BP 115/64 (BP Location: Right Arm)   Pulse 77   Temp 97.7 F (36.5 C)   Resp 17   Ht 5\' 3"  (1.6 m)   Wt 93.8 kg   SpO2 97%   BMI 36.63 kg/m   Gen: No distress Pulm: Clear, nonlabored  CV: RRR, no MRG, no pitting edema GI: Soft, NT, ND, +BS Neuro: Alert and oriented. No new focal deficits. Ext: Left lower leg in cast, toes NVI, RLE NVI.    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 Her psychiatric medication could affect sodium balance but are not recently changed. She does have history of hyponatremia during previous hospitalizations. History of lithium toxicity > 10 years ago and isn't on this medication since. - Labs this morning show robust increase in sodium to 134 and hypERkalemia which is odd since she hasn't been given any supplementation and was given a loop diuretic yesterday. Will modify plan based on  confirmatory BMP which I have reordered. For now, continue lasix 20mg  po daily and mild fluid restriction. - She is asymptomatic. Would aim to avoid overly abrupt correction.  - Continue pain control per primary. - TSH is wnl - Consider cortisol AM check if hyperK confirmed or if BP goes low.  Tyrone Nine, MD [facility] [email or web address] 10/03/2022, 9:40 AM

## 2022-10-03 NOTE — Progress Notes (Signed)
Physical Therapy Session Note  Patient Details  Name: Diana Quinn MRN: [id number] Date of Birth: 1965-10-25  Today's Date: 10/03/2022 PT Individual Time: 6578-4696 + 1345-1425 PT Individual Time Calculation (min): 55 min + 40 min   Short Term Goals: Week 1:  PT Short Term Goal 1 (Week 1): Pt will completed bed mobility with S PT Short Term Goal 2 (Week 1): Pt will completed slide transfer with S + slideboard PT Short Term Goal 3 (Week 1): Pt will complete WC propulsion with S PT Short Term Goal 4 (Week 1): Pt will complete car transfer with CGA + LRAD PT Short Term Goal 5 (Week 1): Pt will initiate stair training  Skilled Therapeutic Interventions/Progress Updates:     SESSION 1:  Pt presents in room in bed, initially disagreeable to PT due to reports of difficulty sleeping and pain  in sternum, BLEs, and sciatic pain. Pt agreeable to PT with focus of session on modalities and stretching for pain relief, pt education, and therapeutic use of self.  Pt provided with disposable hot pack for sternum and kpad positioned on R low back/hip/buttocks area to decrease radiating sciatic pain with pt reporting some relief with placement of kpad. Pt does report some relief with hot pack for sternum however continues to demonstrate painful behaviors with any movement of BUEs or neck. Therapist spends increased time discussing medical history and therapeutic use of self for reduction of anxiety and pain. Therapist discusses concerns for DC with pt reporting increased concerns with stair negotiation using shower chair method as pt reporting increased and weakness in BUEs and RLE for ascending. Therapist educates pt on alternate method of ascending stairs bumping pt up in [facility] with 2 person assist with both description and visual demonstration via youtube video.  Pt tolerates stretching and mobilization for RLE to decrease sciatic pain and improve mobility including: Hamstring stretch 2x60 sec Hamstring  stretch with contract/relax 3sec on/3sec off 2x10 IR/ER RLE x10 Single knee to chest x10 (passive) Glute set x10  Pt reports feeling some relief with single knee to chest and increased localized pain with glute set. Pt educated on sciatic nerve pain for radiating and localizing symptoms with mobility with pt verbalizing understanding.  Pt remains supine with all needs within reach, repositioned for comfort, call light in place, and bed alarm activated at end of session.   SESSION 2: Pt presents in room in bed, reporting decreased "grogginess" compared to this morning but continues to endorse increased pain in BLEs, back, and sternum and agreeable to gentle PT session and pt reporting increased fatigue. Pt agreeable to trial OOB and gentle WC mobility with education on increasing blood flow and mobility to decrease pain.  Discussion revisited on completing stairs in [facility] with 2 person assist, provided with video to share with family members with pt verbalizing understanding.  Pt agreeable to mobility OOB, completes bed mobility with distant supervision and therapist set up [facility] for transfer. Pt completes lateral scoot transfer to [facility] with supervision, stabilizing WC only. Pt reporting pain in sternum with using BUEs for transfer that subsides with rest.  Pt completes WC mobility slowly within BUE movements within tolerable range ~200', no pain reported. Completed to promote improved endurance and mobility as well as decrease pain.  Upon return to room pt requesting to use restroom. Pt completes squat pivot WC to [facility] with supervision with RLE support. Pt manages pants with unilateral UE support in semi standing position supervision. Pt reports pain in R quad  with semi standing and squat pivot transfer. Pt completes periarea hygiene in sitting with distant supervision. Pt then completes squat pivot transfer to R from [facility] to bed with supervision. Pt transitions to supine and remains supine with all needs  within reach, call light in place at end of session. Pt missing 20 minutes out of 60 minute session due to fatigue and pain.   Therapy Documentation Precautions:  Precautions Precautions: Fall Restrictions Weight Bearing Restrictions: Yes RLE Weight Bearing: Weight bearing as tolerated RLE Partial Weight Bearing Percentage or Pounds: minimal with transfers only LLE Weight Bearing: Non weight bearing General: PT Amount of Missed Time (min): 20 Minutes PT Missed Treatment Reason: Patient fatigue;Pain   Therapy/Group: Individual Therapy  Edwin Cap PT, DPT 10/03/2022, 4:16 PM

## 2022-10-03 NOTE — Progress Notes (Signed)
Occupational Therapy Session Note  Patient Details  Name: Diana Quinn MRN: [id number] Date of Birth: Jun 21, 1965  Today's Date: 10/03/2022 OT Individual Time: 1110-1140 OT Individual Time Calculation (min): 30 min  and Today's Date: 10/03/2022 OT Missed Time: 45 Minutes Missed Time Reason: Patient fatigue   Short Term Goals: Week 1:  OT Short Term Goal 1 (Week 1): pt will complete TTB and DABSC transfers with CGA OT Short Term Goal 2 (Week 1): pt will complete LB dressing with AE with set up OT Short Term Goal 3 (Week 1): Pt will perform ADL set up using w/c to gather items with dist s  Skilled Therapeutic Interventions/Progress Updates:    Pt received in bed and stated "I am so exhausted and I have just been nodding off.  I really do not want to try to do therapy due to my pain and fatigue. My sciatica has been acting up".    After talking with pt awhile, pt really was not able to fully participate this session. She was extremely receptive and willing to discuss [facility] transfers and suggestions to make toileting easier.  I set up her drop arm commode with a heavy arm chair on the left side of the chair.  I recommended she pull her w/c directly adjacent to [facility] so seats were at a 90 degree angle.  I showed her how to put a towel or a pillow over the w/c wheel that was inbetween the 2 seat surfaces.  She will then pivot to her L to [facility] from W/c. I demonstrated this as if I was non weight bearing on my LLE.  She can use the seat to her left to lean on for a bigger lateral lean and the wc seat to her R as she has to shift side to side for clothing management and cleansing.  She really liked this idea and thought that could work for her.  Strongly recommended to her that she practice this in the next OT session.    Pt has decided that she will not try to shower when she gets home until the cast is removed.  Recommended she use her new transfer tub bench for the long term as she has a history of  dizziness in the shower.    Pt declined any further intervention. Resting in bed with all needs met and alarm set.   Therapy Documentation Precautions:  Precautions Precautions: Fall Restrictions Weight Bearing Restrictions: Yes RLE Weight Bearing: Weight bearing as tolerated RLE Partial Weight Bearing Percentage or Pounds: minimal with transfers only LLE Weight Bearing: Non weight bearing General: General OT Amount of Missed Time: 45 Minutes    Pain: Pain Assessment Pain Scale: 0-10 Pain Score: 6  Pain Type: Acute pain Pain Location: Leg Pain Orientation: Lower Pain Descriptors / Indicators: Aching Pain Frequency: Constant Pain Onset: On-going Patients Stated Pain Goal: 4 Pain Intervention(s): Medication (See eMAR) ADL: ADL Equipment Provided: Long-handled sponge Eating: Set up Where Assessed-Eating: Bed level Grooming: Setup Where Assessed-Grooming: Sitting at sink, Wheelchair Upper Body Bathing: Setup Where Assessed-Upper Body Bathing: Sitting at sink Lower Body Bathing: Moderate assistance Where Assessed-Lower Body Bathing: Sitting at sink, Wheelchair Upper Body Dressing: Setup Where Assessed-Upper Body Dressing: Sitting at sink Lower Body Dressing: Moderate assistance Where Assessed-Lower Body Dressing: Bed level Toileting: Moderate assistance Where Assessed-Toileting: Bedside Commode Toilet Transfer: Moderate assistance Toilet Transfer Method: Squat pivot Toilet Transfer Equipment: Drop arm bedside commode Tub/Shower Transfer: Minimal assistance Tub/Shower Transfer Method:  (lateral pivot) Tub/Shower Equipment: Transfer  tub bench Film/video editor Method: Administrator: Emergency planning/management officer ADL Comments: lateral transfer bed to w/c and DABSC, bed level LB self care for ease due to standing limitations, w/c sinkside for UB   Therapy/Group: Individual Therapy  Eathan Groman 10/03/2022, 1:03 PM

## 2022-10-04 DIAGNOSIS — K5900 Constipation, unspecified: Secondary | ICD-10-CM

## 2022-10-04 DIAGNOSIS — K912 Postsurgical malabsorption, not elsewhere classified: Secondary | ICD-10-CM

## 2022-10-04 DIAGNOSIS — R0789 Other chest pain: Secondary | ICD-10-CM

## 2022-10-04 DIAGNOSIS — E876 Hypokalemia: Secondary | ICD-10-CM

## 2022-10-04 LAB — GLUCOSE, CAPILLARY
Glucose-Capillary: 241 mg/dL — ABNORMAL HIGH (ref 70–99)
Glucose-Capillary: 146 mg/dL — ABNORMAL HIGH (ref 70–99)

## 2022-10-04 MED ORDER — LIDOCAINE 5 % EX PTCH
1.0000 | MEDICATED_PATCH | CUTANEOUS | Status: DC
  Administered 2022-10-05 – 2022-10-07 (×3): 1 via TRANSDERMAL
  Filled 2022-10-04 (×3): qty 1

## 2022-10-04 NOTE — Progress Notes (Signed)
[facility] PROGRESS NOTE  Diana Quinn (DOB: 1965/10/15) FUX:[id number] PCP: Orson Eva, NP  Brief Narrative: Diana Quinn is a 57 y.o. female with a history of CAD, TIA, T2DM, obesity s/p gastric bypass, hypothyroidism, seizure disorder, schizophrenia/mood disorder who was admitted to inpatient rehab after having a fall with left distal tibial fracture undisplaced and right nondisplaced ankle fracture. Podiatry recommended against surgery, had Left below knee cast applied and was admitted to CIR for rehabilitation. Sodium trended downward, not responsive to fluid restriction, salt tablets, so hospitalist service consulted for hyponatremia.   Subjective: Pain control has improved, medications take care of pain, but take a while to do so. No nausea, vomiting, swelling, headache, vision change, tremor.   Objective: BP 117/63 (BP Location: Right Arm)   Pulse 78   Temp 98.2 F (36.8 C) (Oral)   Resp 18   Ht 5\' 3"  (1.6 m)   Wt 93.8 kg   SpO2 96%   BMI 36.63 kg/m   No distress, well-appearing, normal speech and orientation. Clear, nonlabored, RRR, no significant pitting edema. LLE cast with distal toes NVI. No compression at fibular head.   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 Her psychiatric medication could affect sodium balance but are not recently changed. She does have history of hyponatremia during previous hospitalizations. History of lithium toxicity > 10 years ago and isn't on this medication since. - Labs have demonstrated brisk improvement (11-12 mEq/L increase over 48-72 hours). Leading suspicion is pain-mediated ADH secretion has improved, so SIADH is waning. If hyponatremia recurs, would repeat serum osm, urine Na.  - Will DC lasix as she's euvolemic, Na is in middle of normal range. Can continue fluid restriction of 2L (liberal, mostly to avoid polydipsia) - Suggest recheck BMP ~Monday. - TSH is wnl - Consider  cortisol AM check if hyperK recurs or hypotension develops.   Hospitalist service will sign off, but please reach out with any questions or concerns.   Tyrone Nine, MD [facility] [email or web address] 10/04/2022, 10:10 AM

## 2022-10-04 NOTE — Progress Notes (Signed)
PROGRESS NOTE   Subjective/Complaints:  Pt doing alright today, slept well-- Remus Loffler helped a lot, she's a little groggy today but feels good.  Pain is well controlled overall. Chest wall pain well managed with lidoderm, wants daily rather than PRN.  LBM yesterday. Urinating fine.  Denies any other complaints or concerns today.   ROS: as per HPI.  SOB, abd pain, N/V/D, or any other complaints at this time.   +LLE pain + chest wall soreness-improving +constipation-improved + Fatigue/grogginess  Objective:   No results found. No results for input(s): "WBC", "HGB", "HCT", "PLT" in the last 72 hours.  Recent Labs    10/03/22 1003 10/04/22 0536  NA 135 139  K 5.5* 4.5  CL 104 104  CO2 20* 23  GLUCOSE 125* 87  BUN 19 16  CREATININE 0.80 0.66  CALCIUM 8.2* 8.6*     Intake/Output Summary (Last 24 hours) at 10/04/2022 1226 Last data filed at 10/04/2022 0160 Gross per 24 hour  Intake 240 ml  Output 500 ml  Net -260 ml        Physical Exam: Vital Signs Blood pressure 117/63, pulse 78, temperature 98.2 F (36.8 C), temperature source Oral, resp. rate 18, height 5\' 3"  (1.6 m), weight 93.8 kg, SpO2 96%.   General: Obese, No apparent distress, a little tired HEENT: Head is normocephalic, atraumatic, MMM Heart: Reg rate and rhythm. No murmurs rubs or gallops Chest: CTA bilaterally, nonlabored   + chest wall soreness replicating her pain, tender around sternum- continued- not reassessed today Abdomen: Soft, non-tender, non-distended, bowel sounds normoactive Extremities: Right lower extremity without cam walker, left lower extremity short leg cast in place Psych: Pt's affect is appropriate. Pt is cooperative Skin: Warm and dry  PRIOR EXAMS: Neuro: alert and awake, cranial nerves II through XII grossly intact, normal speech and language Strength 5 out of 5 in bilateral upper extremities Able to lift bilateral lower  extremities to gravity with hip flexion and knee extension Able to wiggle her toes bilaterally  Assessment/Plan: 1. Functional deficits which require 3+ hours per day of interdisciplinary therapy in a comprehensive inpatient rehab setting. Physiatrist is providing close team supervision and 24 hour management of active medical problems listed below. Physiatrist and rehab team continue to assess barriers to discharge/monitor patient progress toward functional and medical goals  Care Tool:  Bathing    Body parts bathed by patient: Right arm, Left arm, Chest, Abdomen, Front perineal area, Buttocks, Right upper leg, Left upper leg, Face   Body parts bathed by helper: Right lower leg Body parts n/a: Left lower leg   Bathing assist Assist Level: Moderate Assistance - Patient 50 - 74%     Upper Body Dressing/Undressing Upper body dressing   What is the patient wearing?: Pull over shirt    Upper body assist Assist Level: Set up assist    Lower Body Dressing/Undressing Lower body dressing      What is the patient wearing?: Pants     Lower body assist Assist for lower body dressing: Moderate Assistance - Patient 50 - 74%     Toileting Toileting    Toileting assist Assist for toileting: Maximal Assistance - Patient  25 - 49%     Transfers Chair/bed transfer  Transfers assist  Chair/bed transfer activity did not occur: Safety/medical concerns  Chair/bed transfer assist level: Supervision/Verbal cueing (lateral transfer)     Locomotion Ambulation   Ambulation assist   Ambulation activity did not occur: Safety/medical concerns          Walk 10 feet activity   Assist  Walk 10 feet activity did not occur: Safety/medical concerns        Walk 50 feet activity   Assist Walk 50 feet with 2 turns activity did not occur: Safety/medical concerns         Walk 150 feet activity   Assist Walk 150 feet activity did not occur: Safety/medical concerns          Walk 10 feet on uneven surface  activity   Assist Walk 10 feet on uneven surfaces activity did not occur: Safety/medical concerns         Wheelchair     Assist Is the patient using a wheelchair?: Yes Type of Wheelchair: Manual    Wheelchair assist level: Independent, Supervision/Verbal cueing Max wheelchair distance: 16ft    Wheelchair 50 feet with 2 turns activity    Assist        Assist Level: Independent   Wheelchair 150 feet activity     Assist      Assist Level: Independent   Blood pressure 117/63, pulse 78, temperature 98.2 F (36.8 C), temperature source Oral, resp. rate 18, height 5\' 3"  (1.6 m), weight 93.8 kg, SpO2 96%.  Medical Problem List and Plan: 1. Functional deficits secondary to left distal tibia fracture nondisplaced and right nondisplaced ankle fracture.  Followed by podiatry services Dr. Nicholes Rough.  By latest note 09/21/2022 podiatry services minimal weightbearing right lower extremity in cam walker.  Bathroom and transition purposes only.  Nonweightbearing left lower extremity with below-knee fiberglass cast applied             -patient may shower -ELOS/Goals: modified independent PT, modified independent OT, 10-12 days - NWB LLE 4-6 weeks, minimal WB RLE in CAM walker w/ walker (Bathroom and transition purposes only) per podiatry; will need to clarify with Podiatry duration for RLE restrictions- messaged podiatry   -Continue CIR -Expected discharge 7/30   2.  Antithrombotics: -DVT/anticoagulation:  Pharmaceutical: Lovenox 40mg  QD, DVT U/S neg but limited             -antiplatelet therapy: N/A 3. Pain Management: Zanaflex 4 mg 3 times daily, oxycodone/Advil/tylenol as needed  -7/19 continue oxycodone for lower extremity pain 4. Mood/Behavior/Sleep: History of PTSD anxiety with schizophrenia.  Continue Caplyta 42mg  QD, Paxil 40 mg daily, trazodone 300 mg nightly, Atarax as needed             - antipsychotic agents: Seroquel 300  mg nightly - takes Belsomra 20 mg at bedtime at home; not on formulary, would discuss with patient bringing from home  -7/19 patient reports she slept better last night however she would like to restart Belsomra.  Will see if patient can have her family member bring in her medication for use in the hospital.  She reports chronic insomnia -09/27/22 pt reports not having many doses of Belsomra-- wants to try melatonin 5mg , scheduled nightly rather than PRN- updated order -09/28/22 slept better overnight -7/24 continues to have intermittently poor sleep, plans to have son bring Belsomra today -7/26 start Ambien 5 mg, patient reports she used this in the past with some benefit, it  did not work as well as Radiation protection practitioner.  If son brings Belsomra we can switch to this medication instead. -10/04/22 used Remus Loffler last night, a little groggy today but manageable; slept better at least.  5. Neuropsych/cognition: This patient is capable of making decisions on her own behalf. 6. Skin/Wound Care: Xeroform over left heel wound 7. Fluids/Electrolytes/Nutrition: Routine and analysis with follow-up chemistries 8.  History of seizure disorder.  Trileptal 600 mg twice daily 9.  History of unstable angina.  Ranexa 1000 mg twice daily, Minipress 2 mg nightly 10.  Hypothyroidism.  Synthroid QD 11.  Hyperlipidemia.  Lipitor 40mg  QD 12.  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  Follow-up dietary 13.  History of alcohol use.  Counseling. Patient denies currently.  14.  Anemia of chronic disease.  Continue iron supplement.  Follow-up CBC  -7/19 Hgb improved to 11.5 continue to monitor  7/22 overall stable hgb at 9.9, continue to follow with weekly labs 15.  Thrombocytosis.  Likely reactive, recheck Monday 16.  Hyponatremia mild.  Appears to be occurring intermittently on chronic basis, recheck Monday  -7/22 start salt tabs BID, recheck tomorrow  -7/23 fluid restriction, continue to monitor sodium level  -7/24 BMP  daily, increase salt tab to TID  -7/25 consulted internal medicine for hyponatremia, -7/26 sodium up to 134 and later 135, internal medicine continuing Lasix 20 mg p.o. daily and mild fluid restriction, appreciate assistance -10/04/22 Na 139, Dr. Jarvis Newcomer of [facility] in to see her today, suspects pain mediated ADH secretion improvement, if hypoNa recurs then repeat serum osm, Urine Na. D/C lasix since euvolemic, continue fluid restriction of 2L, recheck BMP Monday, consider cortisol AM check if hyperK recurs or hypotension develops. [facility] signed off, appreciate assistance.  17. DM2 with hypoglycemia?,  Patient reports she did not have diabetes mellitus but instead was treated for postprandial hypoglycemia after gastric bypass surgery: ?previously on Acarbose 100mg  TID (unclear if patient was taking this) -09/27/22 CBGs variable and episode of hypoglycemia today; continue CBG monitoring and SSI, monitor -7/24 discontinue SSI, continue CBG monitoring but decreased to BID -7/26 few elevated CBGs, continue to monitor -10/04/22 CBGs variable, usually elevated in AMs, may benefit from restarting antihyperglycemic, but perhaps metformin since this wouldn't cause hypoglycemia; defer to weekday team on this decision  CBG (last 3)  Recent Labs    10/03/22 0810 10/03/22 2115 10/04/22 0759  GLUCAP 264* 109* 241*    18. Hx of vitamin D deficiency and hypocalcemia, now with pathologic fx: likely needs DEXA scan, appears that last Vit D level was in 2020 after appropriate replacement therapy, and then was on some form of Calcium-Vit D supplement (500mg  [1250mg ]-200IU dosing?) but had come off it at some point. Calcium low at 8.5 here at admission. Will recheck Vitamin D with Monday labs on 09/29/22, and start on calcium carbonate supplementation 1250mg  every day starting today 7/20, suspect she'll need some form of vitamin D supplementation (daily vs weekly).   -7/23 Vit D 30.14 borderline, will start 1000u daily  19.  Rib/sternum pain: hx of sternal fx from fall per PT -09/28/22 felt a pop while supporting her weight, thinks it's a pulled muscle, feels better upon eval, no deformity or significant tenderness; doubt need for imaging or work up, likely muscular or rib related; will order kpad and lidoderm PRN. Monitor. Activity as tolerated.  -7/22 rib pain improved with interventions above  -7/23 chest wall tenderness today, will increase oxycodone frequency to 10mg  Q4h PRN, will check CXR -  7/24 CXR unremarkable, continue current management -7/25-26 continue current pain medication, controlled overall -10/04/22 changed lidocaine patch to daily rather than PRN, per request 20. Constipation  -7/24 start miralax and senokot daily,sorbitol PRN  -7/26 last BM yesterday, extra-large continue to monitor 21.  Hyperkalemia  -7/26 internal medicine has ordered [facility], continue to monitor  -10/04/22 K WNL today, see #16 above  LOS: 9 days A FACE TO FACE EVALUATION WAS PERFORMED  [address] 10/04/2022, 12:26 PM

## 2022-10-04 NOTE — Progress Notes (Signed)
Occupational Therapy Session Note  Patient Details  Name: Diana Quinn MRN: [id number] Date of Birth: August 17, 1965  Today's Date: 10/04/2022 OT Individual Time: 8295-6213 OT Individual Time Calculation (min): 30 min  and Today's Date: 10/04/2022 OT Missed Time: 30 Minutes Missed Time Reason:  (Missed minutes due to delay in care and MD rounding.)   Short Term Goals: Week 1:  OT Short Term Goal 1 (Week 1): pt will complete TTB and DABSC transfers with CGA OT Short Term Goal 2 (Week 1): pt will complete LB dressing with AE with set up OT Short Term Goal 3 (Week 1): Pt will perform ADL set up using w/c to gather items with dist s  Skilled Therapeutic Interventions/Progress Updates:   Pt misses ~30 minutes of skilled OT intervention due to delay in care and MD rounding.   Pt received resting in bed for skilled OT session with focus on functional transfers and UE strengthening. Pt agreeable to interventions, demonstrating overall pleasant mood. Pt reports 5-6 chest pain with activity. OT offering intermediate rest breaks and positioning suggestions throughout session to address pain/fatigue and maximize participation/safety in session.   Pt dons R CAM Boot with Mod A for chest pain management and time conservation. Pt performs all lateral/scoot transfers during session with supervision. Pt propels from room<>day room with increased time due to chest pain. In day room, pt participates in ~5 mins of arm cycling set at moderate level of resistance.   Pt remained resting in bed with all immediate needs met at end of session. Pt continues to be appropriate for skilled OT intervention to promote further functional independence.   Therapy Documentation Precautions:  Precautions Precautions: Fall Restrictions Weight Bearing Restrictions: Yes RLE Weight Bearing: Weight bearing as tolerated RLE Partial Weight Bearing Percentage or Pounds: minimal with transfers only LLE Weight Bearing: Non weight  bearing   Therapy/Group: Individual Therapy  Lou Cal, OTR/L, MSOT  10/04/2022, 6:53 AM

## 2022-10-05 LAB — GLUCOSE, CAPILLARY
Glucose-Capillary: 97 mg/dL (ref 70–99)
Glucose-Capillary: 87 mg/dL (ref 70–99)

## 2022-10-05 MED ORDER — SENNOSIDES-DOCUSATE SODIUM 8.6-50 MG PO TABS: 1.0000 | ORAL_TABLET | Freq: Every day | ORAL | Status: DC | PRN

## 2022-10-05 MED ORDER — ENOXAPARIN (LOVENOX) PATIENT EDUCATION KIT
PACK | Freq: Once | Status: AC
  Filled 2022-10-05: qty 1

## 2022-10-05 NOTE — Progress Notes (Signed)
Slept good. PRN ambien given at 2213 per patient's request. Complains of chest soreness, reports scheduled lidoderm patch is helping. PRN oxy ir 10mg 's given at 2004 for complaint of LLE pain, rating pain a 7 on pain scale. Cast in place to LLE, able to wiggle toes, (+) sensation, and warm to touch. Reports numbness around left knee cap, "not new, doctors are aware." Declined scheduled senna Rennie Plowman, Asher Muir A

## 2022-10-05 NOTE — Progress Notes (Signed)
PROGRESS NOTE   Subjective/Complaints:  Pt doing alright again today, slept well again last night.  Pain is well controlled overall. Has some paresthesias right around her L kneecap-- no dermatomal distribution, and only in the area directly medial to the patella- not getting any worse.  LBM yesterday. Urinating fine.  Denies any other complaints or concerns today.   ROS: as per HPI.  SOB, abd pain, N/V/D, or any other complaints at this time.   +LLE pain + chest wall soreness-improving +constipation-improved + Fatigue/grogginess  Objective:   No results found. No results for input(s): "WBC", "HGB", "HCT", "PLT" in the last 72 hours.  Recent Labs    10/03/22 1003 10/04/22 0536  NA 135 139  K 5.5* 4.5  CL 104 104  CO2 20* 23  GLUCOSE 125* 87  BUN 19 16  CREATININE 0.80 0.66  CALCIUM 8.2* 8.6*     Intake/Output Summary (Last 24 hours) at 10/05/2022 1202 Last data filed at 10/05/2022 0753 Gross per 24 hour  Intake 956 ml  Output 800 ml  Net 156 ml        Physical Exam: Vital Signs Blood pressure 119/78, pulse 79, temperature 97.8 F (36.6 C), temperature source Oral, resp. rate 18, height 5\' 3"  (1.6 m), weight 93.8 kg, SpO2 98%.   General: Obese, No apparent distress, easily awoken today.  HEENT: Head is normocephalic, atraumatic, MMM Heart: Reg rate and rhythm. No murmurs rubs or gallops Chest: CTA bilaterally, nonlabored   + chest wall soreness replicating her pain, tender around sternum- continued- not reassessed today Abdomen: Soft, non-tender, non-distended, bowel sounds normoactive Extremities: Right lower extremity without cam walker, left lower extremity short leg cast in place.  Psych: Pt's affect is appropriate. Pt is cooperative Skin: Warm and dry Neuro: pt reports paresthesia right around L patella, just medial, but sensation grossly intact, no other paresthesias elsewhere in the leg (limited  by cast, but none distal to the area she's reporting).   PRIOR EXAMS: Neuro: alert and awake, cranial nerves II through XII grossly intact, normal speech and language Strength 5 out of 5 in bilateral upper extremities Able to lift bilateral lower extremities to gravity with hip flexion and knee extension Able to wiggle her toes bilaterally  Assessment/Plan: 1. Functional deficits which require 3+ hours per day of interdisciplinary therapy in a comprehensive inpatient rehab setting. Physiatrist is providing close team supervision and 24 hour management of active medical problems listed below. Physiatrist and rehab team continue to assess barriers to discharge/monitor patient progress toward functional and medical goals  Care Tool:  Bathing    Body parts bathed by patient: Right arm, Left arm, Chest, Abdomen, Front perineal area, Buttocks, Right upper leg, Left upper leg, Face   Body parts bathed by helper: Right lower leg Body parts n/a: Left lower leg   Bathing assist Assist Level: Moderate Assistance - Patient 50 - 74%     Upper Body Dressing/Undressing Upper body dressing   What is the patient wearing?: Pull over shirt    Upper body assist Assist Level: Set up assist    Lower Body Dressing/Undressing Lower body dressing      What is the  patient wearing?: Pants     Lower body assist Assist for lower body dressing: Moderate Assistance - Patient 50 - 74%     Toileting Toileting    Toileting assist Assist for toileting: Maximal Assistance - Patient 25 - 49%     Transfers Chair/bed transfer  Transfers assist  Chair/bed transfer activity did not occur: Safety/medical concerns  Chair/bed transfer assist level: Supervision/Verbal cueing (lateral transfer)     Locomotion Ambulation   Ambulation assist   Ambulation activity did not occur: Safety/medical concerns          Walk 10 feet activity   Assist  Walk 10 feet activity did not occur: Safety/medical  concerns        Walk 50 feet activity   Assist Walk 50 feet with 2 turns activity did not occur: Safety/medical concerns         Walk 150 feet activity   Assist Walk 150 feet activity did not occur: Safety/medical concerns         Walk 10 feet on uneven surface  activity   Assist Walk 10 feet on uneven surfaces activity did not occur: Safety/medical concerns         Wheelchair     Assist Is the patient using a wheelchair?: Yes Type of Wheelchair: Manual    Wheelchair assist level: Independent, Supervision/Verbal cueing Max wheelchair distance: 158ft    Wheelchair 50 feet with 2 turns activity    Assist        Assist Level: Independent   Wheelchair 150 feet activity     Assist      Assist Level: Independent   Blood pressure 119/78, pulse 79, temperature 97.8 F (36.6 C), temperature source Oral, resp. rate 18, height 5\' 3"  (1.6 m), weight 93.8 kg, SpO2 98%.  Medical Problem List and Plan: 1. Functional deficits secondary to left distal tibia fracture nondisplaced and right nondisplaced ankle fracture.  Followed by podiatry services Dr. Nicholes Rough.  By latest note 09/21/2022 podiatry services minimal weightbearing right lower extremity in cam walker.  Bathroom and transition purposes only.  Nonweightbearing left lower extremity with below-knee fiberglass cast applied             -patient may shower -ELOS/Goals: modified independent PT, modified independent OT, 10-12 days - NWB LLE 4-6 weeks, minimal WB RLE in CAM walker w/ walker (Bathroom and transition purposes only) per podiatry; will need to clarify with Podiatry duration for RLE restrictions- messaged podiatry   -Continue CIR -Expected discharge 7/30   2.  Antithrombotics: -DVT/anticoagulation:  Pharmaceutical: Lovenox 40mg  QD, DVT U/S neg but limited             -antiplatelet therapy: N/A 3. Pain Management: Zanaflex 4 mg 3 times daily, oxycodone/Advil/tylenol as needed  -7/19  continue oxycodone for lower extremity pain 4. Mood/Behavior/Sleep: History of PTSD anxiety with schizophrenia.  Continue Caplyta 42mg  QD, Paxil 40 mg daily, trazodone 300 mg nightly, Atarax as needed             - antipsychotic agents: Seroquel 300 mg nightly - takes Belsomra 20 mg at bedtime at home; not on formulary, would discuss with patient bringing from home  -7/19 patient reports she slept better last night however she would like to restart Belsomra.  Will see if patient can have her family member bring in her medication for use in the hospital.  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  If son brings Belsomra we can switch to this medication instead. -10/04/22 used Remus Loffler last night, a little groggy today but manageable; slept better at least.  5. Neuropsych/cognition: This patient is capable of making decisions on her own behalf. 6. Skin/Wound Care: Xeroform over left heel wound 7. Fluids/Electrolytes/Nutrition: Routine and analysis with follow-up chemistries 8.  History of seizure disorder.  Trileptal 600 mg twice daily 9.  History of unstable angina.  Ranexa 1000 mg twice daily, Minipress 2 mg nightly 10.  Hypothyroidism.  Synthroid QD 11.  Hyperlipidemia.  Lipitor 40mg  QD 12.  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  Follow-up dietary 13.  History of alcohol use.  Counseling. Patient denies currently.  14.  Anemia of chronic disease.  Continue iron supplement.  Follow-up CBC  -7/19 Hgb improved to 11.5 continue to monitor  7/22 overall stable hgb at 9.9, continue to follow with weekly labs 15.   Thrombocytosis.  Likely reactive, recheck Monday 16.  Hyponatremia mild.  Appears to be occurring intermittently on chronic basis, recheck Monday  -7/22 start salt tabs BID, recheck tomorrow  -7/23 fluid restriction, continue to monitor sodium level  -7/24 BMP daily, increase salt tab to TID  -7/25 consulted internal medicine for hyponatremia, -7/26 sodium up to 134 and later 135, internal medicine continuing Lasix 20 mg p.o. daily and mild fluid restriction, appreciate assistance -10/04/22 Na 139, Dr. Jarvis Newcomer of [facility] in to see her today, suspects pain mediated ADH secretion improvement, if hypoNa recurs then repeat serum osm, Urine Na. D/C lasix since euvolemic, continue fluid restriction of 2L, recheck BMP Monday, consider cortisol AM check if hyperK recurs or hypotension develops. [facility] signed off, appreciate assistance.  17. DM2 with hypoglycemia?,  Patient reports she did not have diabetes mellitus but instead was treated for postprandial hypoglycemia after gastric bypass surgery: ?previously on Acarbose 100mg  TID (unclear if patient was taking this) -09/27/22 CBGs variable and episode of hypoglycemia today; continue CBG monitoring and SSI, monitor -7/24 discontinue SSI, continue CBG monitoring but decreased to BID -7/26 few elevated CBGs, continue to monitor -10/04/22 CBGs variable, usually elevated in AMs, may benefit from restarting antihyperglycemic, but perhaps metformin since this wouldn't cause hypoglycemia; defer to weekday team on this decision  CBG (last 3)  Recent Labs    10/04/22 0759 10/04/22 2152 10/05/22 0659  GLUCAP 241* 146* 97    18. Hx of vitamin D deficiency and hypocalcemia, now with pathologic fx: likely needs DEXA scan, appears that last Vit D level was in 2020 after appropriate replacement therapy, and then was on some form of Calcium-Vit D supplement (500mg  [1250mg ]-200IU dosing?) but had come off it at some point. Calcium low at 8.5 here at admission. Will recheck Vitamin  D with Monday labs on 09/29/22, and start on calcium carbonate supplementation 1250mg  every day starting today 7/20, suspect she'll need some form of vitamin D supplementation (daily vs weekly).   -7/23 Vit D 30.14 borderline, will start 1000u daily  19. Rib/sternum pain: hx of sternal fx from fall per PT -09/28/22 felt a pop while supporting her weight, thinks it's a pulled muscle, feels better upon eval, no deformity or significant tenderness; doubt need for imaging or work up,  likely muscular or rib related; will order kpad and lidoderm PRN. Monitor. Activity as tolerated.  -7/22 rib pain improved with interventions above  -7/23 chest wall tenderness today, will increase oxycodone frequency to 10mg  Q4h PRN, will check CXR -7/24 CXR unremarkable, continue current management -7/25-26 continue current pain medication, controlled overall -10/04/22 changed lidocaine patch to daily rather than PRN, per request 20. Constipation  -7/24 start miralax and senokot daily,sorbitol PRN  -7/26 last BM yesterday, extra-large continue to monitor -10/05/22 pt moving bowel more regularly, now having a little bit less formed stools, requests senokot be changed to PRN 21.  Hyperkalemia  -7/26 internal medicine has ordered [facility], continue to monitor  -10/04/22 K WNL today, see #16 above  LOS: 10 days A FACE TO FACE EVALUATION WAS PERFORMED  [address] 10/05/2022, 12:02 PM

## 2022-10-06 ENCOUNTER — Encounter: Payer: Self-pay | Admitting: Oncology

## 2022-10-06 ENCOUNTER — Other Ambulatory Visit (HOSPITAL_COMMUNITY): Payer: Self-pay

## 2022-10-06 ENCOUNTER — Telehealth: Payer: Self-pay | Admitting: Podiatry

## 2022-10-06 DIAGNOSIS — E875 Hyperkalemia: Secondary | ICD-10-CM

## 2022-10-06 DIAGNOSIS — G8918 Other acute postprocedural pain: Secondary | ICD-10-CM

## 2022-10-06 LAB — BASIC METABOLIC PANEL
Anion gap: 14 (ref 5–15)
BUN: 12 mg/dL (ref 6–20)
CO2: 17 mmol/L — ABNORMAL LOW (ref 22–32)
Calcium: 8.1 mg/dL — ABNORMAL LOW (ref 8.9–10.3)
Chloride: 98 mmol/L (ref 98–111)
Creatinine, Ser: 0.86 mg/dL (ref 0.44–1.00)
GFR, Estimated: >60 mL/min (ref >60)
Glucose, Bld: 225 mg/dL — ABNORMAL HIGH (ref 70–99)
Potassium: 4.5 mmol/L (ref 3.5–5.1)
Sodium: 129 mmol/L — ABNORMAL LOW (ref 135–145)

## 2022-10-06 LAB — OSMOLALITY: Osmolality: 286 mOsm/kg (ref 275–295)

## 2022-10-06 LAB — SODIUM, URINE, RANDOM: Sodium, Ur: 195 mmol/L

## 2022-10-06 LAB — GLUCOSE, CAPILLARY
Glucose-Capillary: 102 mg/dL — ABNORMAL HIGH (ref 70–99)
Glucose-Capillary: 144 mg/dL — ABNORMAL HIGH (ref 70–99)

## 2022-10-06 MED ORDER — PRAZOSIN HCL 1 MG PO CAPS
2.0000 mg | ORAL_CAPSULE | Freq: Every day | ORAL | 0 refills | Status: DC
  Filled 2022-10-06: qty 60, 30d supply, fill #0

## 2022-10-06 MED ORDER — HYDROXYZINE HCL 25 MG PO TABS
25.0000 mg | ORAL_TABLET | Freq: Three times a day (TID) | ORAL | 0 refills | Status: DC | PRN
  Filled 2022-10-06: qty 10, 4d supply, fill #0

## 2022-10-06 MED ORDER — FERROUS SULFATE 325 (65 FE) MG PO TABS
325.0000 mg | ORAL_TABLET | Freq: Every day | ORAL | 0 refills | Status: DC
  Filled 2022-10-06: qty 100, 100d supply, fill #0

## 2022-10-06 MED ORDER — TRAZODONE HCL 150 MG PO TABS
300.0000 mg | ORAL_TABLET | Freq: Every day | ORAL | 0 refills | Status: DC
  Filled 2022-10-06: qty 60, 30d supply, fill #0

## 2022-10-06 MED ORDER — OXYCODONE HCL 10 MG PO TABS
10.0000 mg | ORAL_TABLET | ORAL | 0 refills | Status: DC | PRN
  Filled 2022-10-06: qty 30, 5d supply, fill #0

## 2022-10-06 MED ORDER — TIZANIDINE HCL 4 MG PO TABS
4.0000 mg | ORAL_TABLET | Freq: Three times a day (TID) | ORAL | 0 refills | Status: DC
  Filled 2022-10-06 – 2022-10-30 (×2): qty 90, 30d supply, fill #0

## 2022-10-06 MED ORDER — PAROXETINE HCL 40 MG PO TABS
40.0000 mg | ORAL_TABLET | Freq: Every day | ORAL | 0 refills | Status: DC
  Filled 2022-10-06: qty 30, 30d supply, fill #0

## 2022-10-06 MED ORDER — QUETIAPINE FUMARATE 100 MG PO TABS
300.0000 mg | ORAL_TABLET | Freq: Every day | ORAL | 0 refills | Status: DC
  Filled 2022-10-06 – 2023-01-26 (×2): qty 90, 30d supply, fill #0

## 2022-10-06 MED ORDER — APIXABAN 2.5 MG PO TABS
2.5000 mg | ORAL_TABLET | Freq: Two times a day (BID) | ORAL | 0 refills | Status: DC
Start: 2022-10-07 — End: 2023-01-08
  Filled 2022-10-06: qty 60, 30d supply, fill #0

## 2022-10-06 MED ORDER — VITAMIN D3 25 MCG PO TABS
1000.0000 [IU] | ORAL_TABLET | Freq: Every day | ORAL | 0 refills | Status: DC
  Filled 2022-10-06: qty 30, 30d supply, fill #0

## 2022-10-06 MED ORDER — RANOLAZINE ER 1000 MG PO TB12
1000.0000 mg | ORAL_TABLET | Freq: Two times a day (BID) | ORAL | 0 refills | Status: DC
  Filled 2022-10-06: qty 60, 30d supply, fill #0

## 2022-10-06 MED ORDER — ATORVASTATIN CALCIUM 40 MG PO TABS
40.0000 mg | ORAL_TABLET | Freq: Every day | ORAL | 0 refills | Status: DC
  Filled 2022-10-06 – 2023-01-26 (×2): qty 30, 30d supply, fill #0

## 2022-10-06 MED ORDER — PANTOPRAZOLE SODIUM 40 MG PO TBEC
40.0000 mg | DELAYED_RELEASE_TABLET | Freq: Every day | ORAL | 0 refills | Status: DC
  Filled 2022-10-06: qty 30, 30d supply, fill #0

## 2022-10-06 MED ORDER — SODIUM CHLORIDE 1 G PO TABS
1.0000 g | ORAL_TABLET | Freq: Three times a day (TID) | ORAL | Status: DC
  Administered 2022-10-06 – 2022-10-07 (×3): 1 g via ORAL
  Filled 2022-10-06 (×3): qty 1

## 2022-10-06 MED ORDER — LIDOCAINE 5 % EX PTCH
1.0000 | MEDICATED_PATCH | CUTANEOUS | 0 refills | Status: DC
  Filled 2022-10-06: qty 30, 30d supply, fill #0

## 2022-10-06 NOTE — Progress Notes (Signed)
Inpatient Rehabilitation Discharge Medication Review by a Pharmacist  A complete drug regimen review was completed for this patient to identify any potential clinically significant medication issues.  High Risk Drug Classes Is patient taking? Indication by Medication  Antipsychotic Yes Seroquel: PTSD anxiety with schizophrenia   Anticoagulant Yes Eliquis x 30 days: VTE prophylaxis  Antibiotic No   Opioid Yes Oxycodone: pain  Antiplatelet No   Hypoglycemics/insulin No   Vasoactive Medication Yes Prazosin, Ranexa: angina  Chemotherapy No   Other Yes Lipitor: hyperlipidemia Acetaminophen, lidoderm, zanaflex: pain Caplyta, Paxil, trazodone, hydroxyzine: PTSD anxiety with schizophrenia  Levothyroxine: hypothyroid Protonix: reflux B12, ferrous sulfate, D2, MVI, D3: vitamins/supplements Belsomra ***: sleep Trileptal: seizure        Type of Medication Issue Identified Description of Issue Recommendation(s)  Drug Interaction(s) (clinically significant)     Duplicate Therapy     Allergy     No Medication Administration End Date     Incorrect Dose     Additional Drug Therapy Needed     Significant med changes from prior encounter (inform family/care partners about these prior to discharge).  Communicate medication changes with patient/family at discharge  Other       Clinically significant medication issues were identified that warrant physician communication and completion of prescribed/recommended actions by midnight of the next day:  {Yes or No?:26198}  Name of provider notified for urgent issues identified: ***   Provider Method of Notification: ***    Pharmacist comments: ***   Time spent performing this drug regimen review (minutes): 30   Thank you for allowing pharmacy to be a part of this patient's care.   Signe Colt, PharmD 08/15/2022 3:14 PM  **Pharmacist phone directory can be found on [email or web address] listed under [facility]**

## 2022-10-06 NOTE — Progress Notes (Addendum)
Patient ID: Diana Quinn, female   DOB: 1965/10/01, 57 y.o.   MRN: [id number] Pt has received all equipment but the hospital bed and she was told they do not have any, so can be delivered today. Have reached out to [facility] with [employer] to look into this for pt and myself. Will update when hear an update  2;36 PM Still trying to figure out about the hospital bed. [employer] looking into it and will reach to another vendor if need be, but pt's insurance is a barrier.

## 2022-10-06 NOTE — Progress Notes (Signed)
Slept good with scheduled meds and PRN ambien. PRN ambien given at 2228. PRN Oxy IR given at 1952 and 0514, mostly complains of LLE pain. Reports chest soreness is better using lidoderm patch. Cast in place to LLE-toes warm to touch, able to wiggle toes, and (+) sensation. Patient reports son is concerned about getting her up steps in wheelchair, because of steps being slick from rain. Using female urinal. LBM 07/27. Alfredo Martinez A

## 2022-10-06 NOTE — Progress Notes (Addendum)
Occupational Therapy Discharge Summary  Patient Details  Name: Diana Quinn MRN: [id number] Date of Birth: February 12, 1966  Date of Discharge from OT service:October 06, 2022  Today's Date: 10/06/2022 OT Individual Time: 4401-0272 OT Individual Time Calculation (min): 55 min   Today's Date: 10/06/2022 OT Individual Time: 1440-1510 OT Individual Time Calculation (min): 30 min   Patient has met 10 of 10 long term goals due to improved activity tolerance, improved balance, and ability to compensate for deficits.  Patient to discharge at overall Supervision level for functional transfers and Mod I for ADLs. Patient's care partner is independent to provide the necessary physical assistance at discharge.    Reasons goals not met: Pt's family able to provide necessary assistance for IADL participation.   Recommendation:  Patient will benefit from ongoing skilled OT services in home health setting to continue to advance functional skills in the area of BADL, iADL, and Reduce care partner burden.  Equipment: TTB and BSC  Reasons for discharge: treatment goals met and discharge from hospital  Patient/family agrees with progress made and goals achieved: Yes  OT Discharge Precautions/Restrictions  Precautions Precautions: Fall Restrictions Weight Bearing Restrictions: Yes RLE Weight Bearing: Weight bearing as tolerated RLE Partial Weight Bearing Percentage or Pounds: WBAT for functional transfers and with CAM Boot LLE Weight Bearing: Non weight bearing Pain Pain Assessment Pain Scale: 0-10 Pain Score: 7  Pain Type: Acute pain Pain Location: Leg Pain Orientation: Left;Lower Pain Descriptors / Indicators: Aching;Constant Pain Frequency: Constant Pain Onset: On-going Patients Stated Pain Goal: 3 Pain Intervention(s): Medication (See eMAR) ADL ADL Equipment Provided: Long-handled sponge Eating: Independent Where Assessed-Eating: Bed level Grooming: Modified independent Where  Assessed-Grooming: Edge of bed Upper Body Bathing: Modified independent Where Assessed-Upper Body Bathing: Edge of bed Lower Body Bathing: Modified independent Where Assessed-Lower Body Bathing: Edge of bed Upper Body Dressing: Modified independent (Device) Where Assessed-Upper Body Dressing: Edge of bed Lower Body Dressing: Modified independent Where Assessed-Lower Body Dressing: Edge of bed Toileting: Modified independent Where Assessed-Toileting: Bedside Commode Toilet Transfer: Modified independent Toilet Transfer Method: Ambulance person: Drop arm bedside commode Tub/Shower Transfer: Minimal assistance Tub/Shower Transfer Method:  (lateral pivot) Tub/Shower Equipment: Insurance underwriter: Modified independent Film/video editor Method: Administrator: Transfer tub bench ADL Comments: Pt will need to sponge-bathe at discharge due to inaccessibility of bathroom at home. Pt discharging with TTB to use upon change in WBAT precautions. Vision Baseline Vision/History: 1 Wears glasses Patient Visual Report: No change from baseline Vision Assessment?: No apparent visual deficits Perception  Perception: Within Functional Limits Praxis Praxis: Intact Cognition Cognition Overall Cognitive Status: Within Functional Limits for tasks assessed Arousal/Alertness: Awake/alert Memory: Appears intact Awareness: Appears intact Problem Solving: Appears intact Safety/Judgment: Appears intact Brief Interview for Mental Status (BIMS) Repetition of Three Words (First Attempt): 3 Temporal Orientation: Year: Correct Temporal Orientation: Month: Accurate within 5 days Temporal Orientation: Day: Correct Recall: "Sock": Yes, no cue required Recall: "Blue": Yes, no cue required Recall: "Bed": Yes, no cue required BIMS Summary Score: 15 Sensation Sensation Light Touch: Impaired by gross assessment Additional Comments: Pt  reports numbness in L knee above cast, however pt able to detect light touch on all toes and on knee and above, reports dull sensation on knee. Pt reports normal sensation in all dermatomes on R LE. Coordination Gross Motor Movements are Fluid and Coordinated: No Fine Motor Movements are Fluid and Coordinated: Yes Coordination and Movement Description: Limited by LLE NWB Motor  Motor Motor: Within Functional Limits Trunk/Postural Assessment  Cervical Assessment Cervical Assessment: Within Functional Limits Thoracic Assessment Thoracic Assessment: Within Functional Limits Lumbar Assessment Lumbar Assessment: Within Functional Limits Postural Control Postural Control: Within Functional Limits  Balance Balance Balance Assessed: Yes Static Sitting Balance Static Sitting - Balance Support: Feet supported Static Sitting - Level of Assistance: 7: Independent Dynamic Sitting Balance Dynamic Sitting - Balance Support: No upper extremity supported;Feet unsupported Dynamic Sitting - Level of Assistance: 6: Modified independent (Device/Increase time) Dynamic Sitting - Balance Activities: Forward lean/weight shifting;Lateral lean/weight shifting Static Standing Balance Static Standing - Balance Support: Bilateral upper extremity supported Static Standing - Level of Assistance: 5: Stand by assistance (Supervision) Extremity/Trunk Assessment RUE Assessment RUE Assessment: Within Functional Limits LUE Assessment LUE Assessment: Within Functional Limits   Skilled OT Intervention:  Session 1: Pt received resting in bed skilled OT session with focus on functional transfers and core strengthening. Pt agreeable to interventions, demonstrating overall pleasant mood. Pt reported 6/10 pain in LLE and 4/10 pain at chest. OT offering intermediate rest breaks and positioning suggestions throughout session to address pain/fatigue and maximize participation/safety in session.   Pt and OT discuss  upcoming discharge with focus placed on DME reccomendations.  Pt self propels WC to all therapy locations with Mod I. In ADL apartment, pt performs furniture transfer with supervision + initial stabilization provided for WC. Pt propels WC over carpeted area with no increase in difficult or pain at chest area. In main therapy gym, pt participates in series of core strengthening exercises, detailed below: Modified sit-ups Torso twists Diagonal reaches  Pt performs 2x10 reps with multimodal cuing for correct form and 4# medicine ball as appropriate. Pt educated on variations she can perform at home for generalized conditioning.   Pt seated in WC with NT present, all immediate needs met at end of session. Pt continues to be appropriate for skilled OT intervention to promote further functional independence.   Session 2: Pt received resting in bed for skilled OT session with focus on BADL participation and discharge planning. Pt agreeable to interventions, demonstrating overall pleasant mood. Pt with pain levels noted above. OT offering intermediate rest breaks and positioning suggestions throughout session to address pain/fatigue and maximize participation/safety in session. Pt performs full-body sponge bathing with overall setup A for needed items. OT answers any remaining questions about upcoming discharge. Pt remained resting in bed with all immediate needs met at end of session. Pt continues to be appropriate for skilled OT intervention to promote further functional independence.   Lou Cal, OTR/L, MSOT  10/06/2022, 3:43 PM

## 2022-10-06 NOTE — Progress Notes (Signed)
C/O feeling anxious, PRN atarax given. Diana Quinn A

## 2022-10-06 NOTE — Plan of Care (Signed)
  Problem: RH Car Transfers Goal: LTG Patient will perform car transfers with assist (PT) Description: LTG: Patient will perform car transfers with assistance (PT). Outcome: Adequate for Discharge   Problem: RH Balance Goal: LTG Patient will maintain dynamic sitting balance (PT) Description: LTG:  Patient will maintain dynamic sitting balance with assistance during mobility activities (PT) Outcome: Completed/Met   Problem: RH Bed Mobility Goal: LTG Patient will perform bed mobility with assist (PT) Description: LTG: Patient will perform bed mobility with assistance, with/without cues (PT). Outcome: Completed/Met   Problem: RH Bed to Chair Transfers Goal: LTG Patient will perform bed/chair transfers w/assist (PT) Description: LTG: Patient will perform bed to chair transfers with assistance (PT). Outcome: Completed/Met   Problem: RH Wheelchair Mobility Goal: LTG Patient will propel w/c in home environment (PT) Description: LTG: Patient will propel wheelchair in home environment, # of feet with assistance (PT). Outcome: Completed/Met

## 2022-10-06 NOTE — Progress Notes (Addendum)
Physical Therapy Discharge Summary  Patient Details  Name: Diana Quinn MRN: [id number] Date of Birth: Jan 28, 1966  Date of Discharge from PT service:October 06, 2022  Today's Date: 10/06/2022 PT Individual Time: 0800-0913 PT Individual Time Calculation (min): 73 min    Patient has met 4 of 5 long term goals due to improved activity tolerance, improved balance, increased strength, decreased pain, ability to compensate for deficits, improved attention, improved awareness, and improved coordination.  Patient to discharge at a wheelchair level  with supervision for squat pivot transfers, and CGA for car transfers .   Patient's care partner is independent to provide the necessary physical assistance at discharge.  Reasons goals not met: pt requires CGA for stabilization for stand pivot transfer with RW to SUV.   Recommendation:  Patient will benefit from ongoing skilled PT services in outpatient setting to continue to advance safe functional mobility, address ongoing impairments in balance, endurance, gait, and minimize fall risk.  Equipment: Hospital bed, RW, shower chair  Reasons for discharge: treatment goals met and discharge from hospital  Patient/family agrees with progress made and goals achieved: Yes  PT Discharge Precautions/Restrictions Precautions Precautions: Fall Restrictions Weight Bearing Restrictions: Yes RLE Weight Bearing: Weight bearing as tolerated RLE Partial Weight Bearing Percentage or Pounds: minimal with transfers and CAM Boot LLE Weight Bearing: Non weight bearing Pain Interference Pain Interference Pain Effect on Sleep: 4. Almost constantly Pain Interference with Therapy Activities: 1. Rarely or not at all Pain Interference with Day-to-Day Activities: 2. Occasionally Vision/Perception  Vision - History Ability to See in Adequate Light: 0 Adequate Perception Perception: Within Functional Limits Praxis Praxis: Intact  Cognition Overall Cognitive  Status: Within Functional Limits for tasks assessed Arousal/Alertness: Awake/alert Orientation Level: Oriented X4 Memory: Appears intact Awareness: Appears intact Problem Solving: Appears intact Safety/Judgment: Impaired Sensation Sensation Light Touch: Impaired by gross assessment Additional Comments: pt reports numbness in L knee above cast, however pt able to detect light touch on all toes and on knee and above, reports dull sensation on knee. Pt reports normal sensation in all dermatomes on R LE Coordination Gross Motor Movements are Fluid and Coordinated: No (2/2 cast on L LE) Coordination and Movement Description: limited by L LE NWBing Motor  Motor Motor: Within Functional Limits  Mobility Bed Mobility Bed Mobility: Rolling Right;Rolling Left;Supine to Sit;Sit to Supine Rolling Right: Independent with assistive device Rolling Left: Independent with assistive device Supine to Sit: Independent with assistive device Sit to Supine: Independent with assistive device Transfers Transfers: Lateral/Scoot Theatre manager Pivot Transfers: Supervision/Verbal cueing Lateral/Scoot Transfers: Supervision/Verbal cueing Transfer (Assistive device): None Locomotion  Gait Ambulation: No Gait Gait: No Stairs / Additional Locomotion Stairs: Yes Stairs Assistance: Moderate Assistance - Patient 50 - 74% (shower chair technique) Stair Management Technique: One rail Left Number of Stairs: 7 Wheelchair Mobility Wheelchair Mobility: Yes Wheelchair Assistance: Independent with assistive device Wheelchair Parts Management: Independent Distance: 150  Trunk/Postural Assessment  Cervical Assessment Cervical Assessment: Within Functional Limits Thoracic Assessment Thoracic Assessment: Within Functional Limits Lumbar Assessment Lumbar Assessment: Within Functional Limits Postural Control Postural Control: Within Functional Limits  Balance Balance Balance Assessed:  Yes Static Sitting Balance Static Sitting - Balance Support: Feet supported Static Sitting - Level of Assistance: 7: Independent Dynamic Sitting Balance Dynamic Sitting - Balance Support: No upper extremity supported;Feet unsupported Dynamic Sitting - Level of Assistance: 6: Modified independent (Device/Increase time) (supervision, with squat pivot and lateral scoot transfer) Dynamic Sitting - Balance Activities: Forward lean/weight shifting;Lateral lean/weight shifting Static Standing Balance  Static Standing - Balance Support: No upper extremity supported Static Standing - Level of Assistance: 5: Stand by assistance (Supervision) Dynamic Standing Balance Dynamic Standing - Balance Support: Bilateral upper extremity supported Dynamic Standing - Level of Assistance: 5: Stand by assistance (CGA stand pivot transfer) Extremity Assessment  RLE Assessment General Strength Comments: gross 3+/5 in hip, other areas not tested 2/2 casting LLE Assessment General Strength Comments: gross 3/5 in hip, other areas not tested 2/2 casting   Treatment Session 1   Pt supine in bed upon arrival. Pt agreeable to therapy. Pt reports intermittent sternal and L LE pain.   Pt performed discharge components. Pt reviewed and performed HEP, verbal and tactile cues provided for technique and to reduce compensations.   Access Code: [email or web address] URL: [email or web address] Date: 10/06/2022 Prepared by: Ambrose Finland  Exercises - Active Straight Leg Raise with Quad Set  - 1 x daily - 7 x weekly - 3 sets - 10 reps - Supine Gluteal Sets  - 1 x daily - 7 x weekly - 3 sets - 10 reps - Supine Hip Abduction  - 1 x daily - 7 x weekly - 3 sets - 10 reps - Seated Long Arc Quad  - 1 x daily - 7 x weekly - 3 sets - 10 reps - Seated March  - 1 x daily - 7 x weekly - 3 sets - 10 reps - Wheelchair Push-Up (AKA)  - 1 x daily - 7 x weekly - 3 sets - 10 reps  Pt reports concerns about stair navigation upon  discharge as it will be raining. Pt requesting ambulance transport for safety. Notified Child psychotherapist. Pt reports her equipment has not yet arrived. Notified Child psychotherapist.   Pt reports concern going home is navigating narrow spaces in home around pool table, kitchen table and through doorways. Therapist simulated enviornment and pt manuevered around obstacles and through narrow doorway with mod I. Education provided on ability to remove push rim if needed for extra space. Pt verbalized understanding.   Pt stood with RW and unilateral alternating unilateral UE support to place and remove horshoes off of cone with supervision. Pt stood with no UE support and clapped with both hands, pt reports increase in pain with no UE support, discontinued activity.   Pt supine in bed at end of session with all needs within reach and bed alarm on.     [facility] [place], [place], DPT  10/06/2022, 8:33 AM

## 2022-10-06 NOTE — Progress Notes (Signed)
PROGRESS NOTE   Subjective/Complaints:  Pt still c/o numbness around left knee, but there is no pain associated with it. Doesn't affect movement of leg. Chest wall pain with inspiration. She is looking to getting home tomorrow. Slept well last night  ROS: Patient denies fever, rash, sore throat, blurred vision, dizziness, nausea, vomiting, diarrhea, cough, shortness of breath or chest pain,   headache, or mood change.     Objective:   No results found. Recent Labs    10/06/22 0726  WBC 5.6  HGB 11.0*  HCT 33.9*  PLT 404*    Recent Labs    10/04/22 0536 10/06/22 0726  NA 139 130*  K 4.5 4.1  CL 104 97*  CO2 23 23  GLUCOSE 87 155*  BUN 16 11  CREATININE 0.66 0.71  CALCIUM 8.6* 8.4*     Intake/Output Summary (Last 24 hours) at 10/06/2022 1050 Last data filed at 10/06/2022 0759 Gross per 24 hour  Intake 1917 ml  Output 1800 ml  Net 117 ml        Physical Exam: Vital Signs Blood pressure 129/68, pulse 81, temperature 98 F (36.7 C), temperature source Oral, resp. rate 18, height 5\' 3"  (1.6 m), weight 93.8 kg, SpO2 96%.   Constitutional: No distress . Vital signs reviewed. HEENT: NCAT, EOMI, oral membranes moist Neck: supple Cardiovascular: RRR without murmur. No JVD    Respiratory/Chest: CTA Bilaterally without wheezes or rales. Normal effort    GI/Abdomen: BS +, non-tender, non-distended Ext: no clubbing, cyanosis, or edema Psych: pleasant and cooperative  Skin: Warm and dry Neuro: pt reports sl decrease in sensation near medial aspect of left knee. Still senses pain and gross touch. Alert and oriented x 3. Normal insight and awareness. Intact Memory. Normal language and speech. Cranial nerve exam unremarkable. .   Strength 5 out of 5 in bilateral upper extremities Able to lift bilateral lower extremities to gravity with hip flexion and knee extension Able to wiggle her toes bilaterally Right LE in  Cam boot and left leg is casted.  Assessment/Plan: 1. Functional deficits which require 3+ hours per day of interdisciplinary therapy in a comprehensive inpatient rehab setting. Physiatrist is providing close team supervision and 24 hour management of active medical problems listed below. Physiatrist and rehab team continue to assess barriers to discharge/monitor patient progress toward functional and medical goals  Care Tool:  Bathing    Body parts bathed by patient: Right arm, Left arm, Chest, Abdomen, Front perineal area, Buttocks, Right upper leg, Left upper leg, Face   Body parts bathed by helper: Right lower leg Body parts n/a: Left lower leg   Bathing assist Assist Level: Moderate Assistance - Patient 50 - 74%     Upper Body Dressing/Undressing Upper body dressing   What is the patient wearing?: Pull over shirt    Upper body assist Assist Level: Set up assist    Lower Body Dressing/Undressing Lower body dressing      What is the patient wearing?: Pants     Lower body assist Assist for lower body dressing: Moderate Assistance - Patient 50 - 74%     Toileting Toileting    Toileting assist  Assist for toileting: Maximal Assistance - Patient 25 - 49%     Transfers Chair/bed transfer  Transfers assist  Chair/bed transfer activity did not occur: Safety/medical concerns  Chair/bed transfer assist level: Supervision/Verbal cueing (lateral transfer)     Locomotion Ambulation   Ambulation assist   Ambulation activity did not occur: Safety/medical concerns          Walk 10 feet activity   Assist  Walk 10 feet activity did not occur: Safety/medical concerns        Walk 50 feet activity   Assist Walk 50 feet with 2 turns activity did not occur: Safety/medical concerns         Walk 150 feet activity   Assist Walk 150 feet activity did not occur: Safety/medical concerns         Walk 10 feet on uneven surface  activity   Assist Walk  10 feet on uneven surfaces activity did not occur: Safety/medical concerns         Wheelchair     Assist Is the patient using a wheelchair?: Yes Type of Wheelchair: Manual    Wheelchair assist level: Independent, Supervision/Verbal cueing Max wheelchair distance: 125ft    Wheelchair 50 feet with 2 turns activity    Assist        Assist Level: Independent   Wheelchair 150 feet activity     Assist      Assist Level: Independent   Blood pressure 129/68, pulse 81, temperature 98 F (36.7 C), temperature source Oral, resp. rate 18, height 5\' 3"  (1.6 m), weight 93.8 kg, SpO2 96%.  Medical Problem List and Plan: 1. Functional deficits secondary to left distal tibia fracture nondisplaced and right nondisplaced ankle fracture.  Followed by podiatry services Dr. Nicholes Rough.  By latest note 09/21/2022 podiatry services minimal weightbearing right lower extremity in cam walker.  Bathroom and transition purposes only.  Nonweightbearing left lower extremity with below-knee fiberglass cast applied             -patient may shower -ELOS/Goals: modified independent PT, modified independent OT, 10-12 days - NWB LLE 4-6 weeks, minimal WB RLE in CAM walker w/ walker (Bathroom and transition purposes only) per podiatry    -finalizing dc planning for 7/30   2.  Antithrombotics: -DVT/anticoagulation:  Pharmaceutical: Lovenox 40mg  QD, DVT U/S neg but limited             -antiplatelet therapy: N/A 3. Pain Management: Zanaflex 4 mg 3 times daily, oxycodone/Advil/tylenol as needed  -7/19 continue oxycodone for lower extremity pain  7/29 pain generally controlled 4. Mood/Behavior/Sleep: History of PTSD anxiety with schizophrenia.  Continue Caplyta 42mg  QD, Paxil 40 mg daily, trazodone 300 mg nightly, Atarax as needed             - antipsychotic agents: Seroquel 300 mg nightly - takes Belsomra 20 mg at bedtime at home; not on formulary, would discuss with patient bringing from home   -7/19 patient reports she slept better last night however she would like to restart Belsomra.  Will see if patient can have her family member bring in her medication for use in the hospital.  She reports chronic insomnia -09/27/22 pt reports not having many doses of Belsomra-- wants to try melatonin 5mg , scheduled nightly rather than PRN- updated order -09/28/22 slept better overnight -7/24 continues to have intermittently poor sleep, plans to have son bring Belsomra today -7/26 start Ambien 5 mg, patient reports she used this in the past with some benefit,  it did not work as well as Radiation protection practitioner.  If son brings Belsomra we can switch to this medication instead. -7/29 sleeping well with ambien and trazodone at hs 5. Neuropsych/cognition: This patient is capable of making decisions on her own behalf. 6. Skin/Wound Care: Xeroform over left heel wound 7. Fluids/Electrolytes/Nutrition: Routine and analysis with follow-up chemistries 8.  History of seizure disorder.  Trileptal 600 mg twice daily 9.  History of unstable angina.  Ranexa 1000 mg twice daily, Minipress 2 mg nightly 10.  Hypothyroidism.  Synthroid QD 11.  Hyperlipidemia.  Lipitor 40mg  QD 12.  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  Follow-up dietary 13.  History of alcohol use.  Counseling. Patient denies currently.  14.  Anemia of chronic disease.  Continue iron supplement.  Follow-up CBC  -7/19 Hgb improved to 11.5 continue to monitor  7/29 hgb up to 11 today 15.  Thrombocytosis.  Likely reactive, recheck Monday 16.  Hyponatremia mild.  Appears to be occurring intermittently on chronic basis, recheck Monday  -7/22 start salt tabs BID, recheck tomorrow  -7/23 fluid restriction, continue to monitor sodium level  -7/24 BMP daily, increase salt tab to TID  -7/25 consulted internal medicine for hyponatremia, -7/26 sodium up to 134 and later 135, internal medicine continuing Lasix 20 mg p.o. daily and mild fluid restriction,  appreciate assistance -10/04/22 Na 139, Dr. Jarvis Newcomer of [facility] in to see her today, suspects pain mediated ADH secretion improvement, if hypoNa recurs then repeat serum osm, Urine Na. D/C lasix since euvolemic, continue fluid restriction of 2L, recheck BMP Monday, consider cortisol AM check if hyperK recurs or hypotension develops. [facility] signed off, appreciate assistance.  7/29--sodium drop to 130 today with unclear cause.   -reduce to 1800 cc Fluid per day  -recheck labs this afternoon including serum osmo and urine Na+ 17. DM2 with hypoglycemia?,  Patient reports she did not have diabetes mellitus but instead was treated for postprandial hypoglycemia after gastric bypass surgery: ?previously on Acarbose 100mg  TID (unclear if patient was taking this) -09/27/22 CBGs variable and episode of hypoglycemia today; continue CBG monitoring and SSI, monitor -7/24 discontinue SSI, continue CBG monitoring but decreased to BID -7/26 few elevated CBGs, continue to monitor -7/29 fair control. Continue current regimen for now  CBG (last 3)  Recent Labs    10/05/22 0659 10/05/22 2152 10/06/22 0618  GLUCAP 97 87 102*    18. Hx of vitamin D deficiency and hypocalcemia, now with pathologic fx: likely needs DEXA scan, appears that last Vit D level was in 2020 after appropriate replacement therapy, and then was on some form of Calcium-Vit D supplement (500mg  [1250mg ]-200IU dosing?) but had come off it at some point. Calcium low at 8.5 here at admission. Will recheck Vitamin D with Monday labs on 09/29/22, and start on calcium carbonate supplementation 1250mg  every day starting today 7/20, suspect she'll need some form of vitamin D supplementation (daily vs weekly).   -7/23 Vit D 30.14 borderline, will start 1000u daily  19. Rib/sternum pain: hx of sternal fx from fall per PT -09/28/22 felt a pop while supporting her weight, thinks it's a pulled muscle, feels better upon eval, no deformity or significant tenderness; doubt  need for imaging or work up, likely muscular or rib related; will order kpad and lidoderm PRN. Monitor. Activity as tolerated.  -7/22 rib pain improved with interventions above  -7/23 chest wall tenderness today, will increase oxycodone frequency to 10mg  Q4h PRN, will check CXR -7/24 CXR unremarkable,  continue current management -7/25-26 continue current pain medication, controlled overall -10/04/22 changed lidocaine patch to daily rather than PRN, per request with improvement. Still has pain with inspiration 20. Constipation  -7/24 start miralax and senokot daily,sorbitol PRN  -7/26 last BM yesterday, extra-large continue to monitor -10/05/22 pt moving bowel more regularly, now having a little bit less formed stools, requests senokot be changed to PRN -last bm 7/27 21.  Hyperkalemia  -7/26 internal medicine has ordered [facility], continue to monitor  -7/29 K+ 4.1  LOS: 11 days A FACE TO FACE EVALUATION WAS PERFORMED  Ranelle Oyster 10/06/2022, 10:50 AM

## 2022-10-06 NOTE — Plan of Care (Signed)
  Problem: RH Balance Goal: LTG: Patient will maintain dynamic sitting balance (OT) Description: LTG:  Patient will maintain dynamic sitting balance with assistance during activities of daily living (OT) Outcome: Completed/Met   Problem: RH Grooming Goal: LTG Patient will perform grooming w/assist,cues/equip (OT) Description: LTG: Patient will perform grooming with assist, with/without cues using equipment (OT) Outcome: Completed/Met   Problem: RH Bathing Goal: LTG Patient will bathe all body parts with assist levels (OT) Description: LTG: Patient will bathe all body parts with assist levels (OT) Outcome: Completed/Met   Problem: RH Dressing Goal: LTG Patient will perform upper body dressing (OT) Description: LTG Patient will perform upper body dressing with assist, with/without cues (OT). Outcome: Completed/Met Goal: LTG Patient will perform lower body dressing w/assist (OT) Description: LTG: Patient will perform lower body dressing with assist, with/without cues in positioning using equipment (OT) Outcome: Completed/Met   Problem: RH Toileting Goal: LTG Patient will perform toileting task (3/3 steps) with assistance level (OT) Description: LTG: Patient will perform toileting task (3/3 steps) with assistance level (OT)  Outcome: Completed/Met   Problem: RH Simple Meal Prep Goal: LTG Patient will perform simple meal prep w/assist (OT) Description: LTG: Patient will perform simple meal prep with assistance, with/without cues (OT). Outcome: Adequate for Discharge   Problem: RH Laundry Goal: LTG Patient will perform laundry w/assist, cues (OT) Description: LTG: Patient will perform laundry with assistance, with/without cues (OT). Outcome: Adequate for Discharge   Problem: RH Toilet Transfers Goal: LTG Patient will perform toilet transfers w/assist (OT) Description: LTG: Patient will perform toilet transfers with assist, with/without cues using equipment (OT) Outcome:  Completed/Met   Problem: RH Tub/Shower Transfers Goal: LTG Patient will perform tub/shower transfers w/assist (OT) Description: LTG: Patient will perform tub/shower transfers with assist, with/without cues using equipment (OT) Outcome: Completed/Met

## 2022-10-06 NOTE — Telephone Encounter (Signed)
Pt recently fell and is in the hospital. Family will call to see if she can make the NP appointment.

## 2022-10-06 NOTE — Progress Notes (Signed)
Patient ID: Diana Quinn, female   DOB: 1965-08-31, 57 y.o.   MRN: [id number]   Diagnosis code:S82.891A S82.892A M51.36 G93.41  Height: 5'3  Weight: 199 lbs   Patient has Bilateral ankle fractures which requires her lower extremities  to be positioned in ways not feasible with a normal bed. Her body  requires frequent and immediate changes in body position which cannot be achieved with a normal bed. Her legs need to be elevated. Length of need-6-9 months

## 2022-10-07 ENCOUNTER — Encounter: Payer: Self-pay | Admitting: Oncology

## 2022-10-07 ENCOUNTER — Other Ambulatory Visit (HOSPITAL_COMMUNITY): Payer: Self-pay

## 2022-10-07 LAB — GLUCOSE, CAPILLARY: Glucose-Capillary: 98 mg/dL (ref 70–99)

## 2022-10-07 MED ORDER — SODIUM CHLORIDE 1 G PO TABS
1.0000 g | ORAL_TABLET | Freq: Three times a day (TID) | ORAL | 0 refills | Status: DC
  Filled 2022-10-07: qty 100, 34d supply, fill #0

## 2022-10-07 NOTE — Progress Notes (Signed)
Patient ID: Diana Quinn, female   DOB: [birth year], 57 y.o.   MRN: [id number]  INPATIENT REHABILITATION DISCHARGE NOTE   Discharge instructions by: Deatra Ina, PA-C  Verbalized understanding: yes  Skin Quinn/Wound Quinn healing? N/A  Pain: 6/10 previously medicated  IV's: N/A  Tubes/Drains: N/A  O2: N/A  Safety instructions: provided  Patient belongings: returned to patient  Discharged to: home  Discharged via: PTAR  Notes: Son to come to nurses station to pick up equipment.

## 2022-10-07 NOTE — Progress Notes (Signed)
Patient ID: Diana Quinn, female   DOB: 01/27/1966, 57 y.o.   MRN: [id number]  Still working on getting a hospital bed for pt. [employer] has the frame but no mattresses waiting for them to come in. Tried other equipment companies none other in network with her insurance. Pt reports will need to sleep on sofa or one of her grandchildren's bed. Since not raining son can transport home will need to come and get her equipment in the room. Pt aware MD feels medically ready to go home today.

## 2022-10-07 NOTE — Progress Notes (Signed)
Inpatient Rehabilitation Care Coordinator Discharge Note   Patient Details  Name: Diana Quinn MRN: [id number] Date of Birth: February 20, 1966   Discharge location: HOME WITH SON AND DAUGHTER IN-LAW WHO CAN PROVIDE SUPERVISION LEVEL  Length of Stay: 12 DAYS  Discharge activity level: MOD/I WHEELCHAIR LEVEL  Home/community participation: ACTIVE  Patient response WU:[id number] Literacy - How often do you need to have someone help you when you read instructions, pamphlets, or other written material from your doctor or pharmacy?: Never  Patient response YN:WGNFAO Isolation - How often do you feel lonely or isolated from those around you?: Never  Services provided included: MD, RD, PT, OT, RN, CM, TR, Pharmacy, Neuropsych, SW  Financial Services:  Field seismologist Utilized: Medicaid    Choices offered to/list presented to: PT  Follow-up services arranged:  DME, Patient/Family has no preference for HH/DME agencies      DME : [facility] BED, WHEELCHAIR,  TUB BENCH, DROP-ARM BEDSIDE COMMODE  [facility] BED MATTRESS ARE ON BACK ORDERED AND UNABLE TO USE ANOTHER DME AGENCY DUE TO PT'S INSURANCE ISSUES. 12:36 PM Mattress came in and being delivered to home today    Patient response to transportation need: Is the patient able to respond to transportation needs?: Yes In the past 12 months, has lack of transportation kept you from medical appointments or from getting medications?: No In the past 12 months, has lack of transportation kept you from meetings, work, or from getting things needed for daily living?: No   Patient/Family verbalized understanding of follow-up arrangements:  Yes  Individual responsible for coordination of the follow-up plan: SELF [phone]  DAVID-SON [phone]  Confirmed correct DME delivered: [name] 10/07/2022    Comments (or additional information):PT DID WELL AND REACHED GOALS OF MOD/I WHEELCHAIR LEVEL. SON AND DAUGHTER IN-LAW WERE  HERE FOR EDUCATION AND FEEL COMFORTABLE WITH CARE.  Summary of Stay    Date/Time Discharge Planning CSW  10/01/22 [phone] HOme with son and his family she will be alone some and will need to be mod/i wheelchair level. Barriers are the steps to get into home. Will await team's recommnedations for DME and will see if [name] needed or wait until she can WB then will need OP RGD       [name], [name]

## 2022-10-07 NOTE — Progress Notes (Signed)
PROGRESS NOTE   Subjective/Complaints:  No new concerns or complaints.  Sodium 130 today, stable from yesterday.  She is looking forward to going home today.  ROS: Patient denies fever, rash, sore throat, blurred vision, dizziness, nausea, vomiting, diarrhea, cough, abdominal pain, shortness of breath or chest pain,   headache, or mood change.     Objective:   No results found. Recent Labs    10/06/22 0726  WBC 5.6  HGB 11.0*  HCT 33.9*  PLT 404*    Recent Labs    10/06/22 1304 10/07/22 0552  NA 129* 130*  K 4.5 4.1  CL 98 96*  CO2 17* 24  GLUCOSE 225* 102*  BUN 12 14  CREATININE 0.86 0.69  CALCIUM 8.1* 8.2*     Intake/Output Summary (Last 24 hours) at 10/07/2022 0826 Last data filed at 10/07/2022 0733 Gross per 24 hour  Intake 703 ml  Output 1300 ml  Net -597 ml        Physical Exam: Vital Signs Blood pressure 103/81, pulse 78, temperature 98 F (36.7 C), temperature source Oral, resp. rate 18, height 5\' 3"  (1.6 m), weight 93.8 kg, SpO2 97%.   Constitutional: No distress . Vital signs reviewed. HEENT: NCAT, EOMI, oral membranes moist Neck: supple Cardiovascular: RRR without murmur. No JVD    Respiratory/Chest: CTA Bilaterally without wheezes or rales. Normal effort    GI/Abdomen: BS +, non-tender, non-distended, soft Ext: no clubbing, cyanosis, or edema Psych: pleasant and cooperative  Skin: Warm and dry Neuro: pt reports sl decrease in sensation near medial aspect of left knee. Still senses pain and gross touch. Alert and oriented x 3. Normal insight and awareness. Intact Memory. Normal language and speech. Cranial nerve exam unremarkable. .   Strength 5 out of 5 in bilateral upper extremities Able to lift bilateral lower extremities to gravity with hip flexion and knee extension Able to wiggle her toes bilaterally Right LE in Cam boot and left leg is casted.  Assessment/Plan: 1. Functional  deficits which require 3+ hours per day of interdisciplinary therapy in a comprehensive inpatient rehab setting. Physiatrist is providing close team supervision and 24 hour management of active medical problems listed below. Physiatrist and rehab team continue to assess barriers to discharge/monitor patient progress toward functional and medical goals  Care Tool:  Bathing    Body parts bathed by patient: Right arm, Left arm, Chest, Abdomen, Front perineal area, Buttocks, Right upper leg, Left upper leg, Face, Right lower leg   Body parts bathed by helper: Right lower leg Body parts n/a: Left lower leg   Bathing assist Assist Level: Independent with assistive device     Upper Body Dressing/Undressing Upper body dressing   What is the patient wearing?: Pull over shirt    Upper body assist Assist Level: Independent with assistive device    Lower Body Dressing/Undressing Lower body dressing      What is the patient wearing?: Pants     Lower body assist Assist for lower body dressing: Independent with assitive device     Toileting Toileting    Toileting assist Assist for toileting: Independent with assistive device     Transfers Chair/bed transfer  Transfers assist  Chair/bed transfer activity did not occur: Safety/medical concerns  Chair/bed transfer assist level: Supervision/Verbal cueing (lateral transfer)     Locomotion Ambulation   Ambulation assist   Ambulation activity did not occur: Safety/medical concerns          Walk 10 feet activity   Assist  Walk 10 feet activity did not occur: Safety/medical concerns        Walk 50 feet activity   Assist Walk 50 feet with 2 turns activity did not occur: Safety/medical concerns         Walk 150 feet activity   Assist Walk 150 feet activity did not occur: Safety/medical concerns         Walk 10 feet on uneven surface  activity   Assist Walk 10 feet on uneven surfaces activity did not  occur: Safety/medical concerns         Wheelchair     Assist Is the patient using a wheelchair?: Yes Type of Wheelchair: Manual    Wheelchair assist level: Independent Max wheelchair distance: 110ft    Wheelchair 50 feet with 2 turns activity    Assist        Assist Level: Independent   Wheelchair 150 feet activity     Assist      Assist Level: Independent   Blood pressure 103/81, pulse 78, temperature 98 F (36.7 C), temperature source Oral, resp. rate 18, height 5\' 3"  (1.6 m), weight 93.8 kg, SpO2 97%.  Medical Problem List and Plan: 1. Functional deficits secondary to left distal tibia fracture nondisplaced and right nondisplaced ankle fracture.  Followed by podiatry services Dr. Nicholes Rough.  By latest note 09/21/2022 podiatry services minimal weightbearing right lower extremity in cam walker.  Bathroom and transition purposes only.  Nonweightbearing left lower extremity with below-knee fiberglass cast applied             -patient may shower -ELOS/Goals: modified independent PT, modified independent OT, 10-12 days - NWB LLE 4-6 weeks, minimal WB RLE in CAM walker w/ walker (Bathroom and transition purposes only) per podiatry    -DC today   -Follow-up with podiatry outpatient 2.  Antithrombotics: -DVT/anticoagulation:  Pharmaceutical: Lovenox 40mg  QD, DVT U/S neg but limited             -antiplatelet therapy: N/A 3. Pain Management: Zanaflex 4 mg 3 times daily, oxycodone/Advil/tylenol as needed  -7/19 continue oxycodone for lower extremity pain  7/29-30 pain generally controlled 4. Mood/Behavior/Sleep: History of PTSD anxiety with schizophrenia.  Continue Caplyta 42mg  QD, Paxil 40 mg daily, trazodone 300 mg nightly, Atarax as needed             - antipsychotic agents: Seroquel 300 mg nightly - takes Belsomra 20 mg at bedtime at home; not on formulary, would discuss with patient bringing from home  -7/19 patient reports she slept better last night however  she would like to restart Belsomra.  Will see if patient can have her family member bring in her medication for use in the hospital.  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  If son brings Belsomra we can switch to this medication  instead. -7/29 sleeping well with ambien and trazodone at hs 5. Neuropsych/cognition: This patient is capable of making decisions on her own behalf. 6. Skin/Wound Care: Xeroform over left heel wound 7. Fluids/Electrolytes/Nutrition: Routine and analysis with follow-up chemistries 8.  History of seizure disorder.  Trileptal 600 mg twice daily 9.  History of unstable angina.  Ranexa 1000 mg twice daily, Minipress 2 mg nightly 10.  Hypothyroidism.  Synthroid QD 11.  Hyperlipidemia.  Lipitor 40mg  QD 12.  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  Follow-up dietary 13.  History of alcohol use.  Counseling. Patient denies currently.  14.  Anemia of chronic disease.  Continue iron supplement.  Follow-up CBC  -7/19 Hgb improved to 11.5 continue to monitor  7/29 hgb up to 11 today 15.  Thrombocytosis.  Likely reactive, recheck Monday 16.  Hyponatremia mild.  Appears to be occurring intermittently on chronic basis, recheck Monday  -7/22 start salt tabs BID, recheck tomorrow  -7/23 fluid restriction, continue to monitor sodium level  -7/24 BMP daily, increase salt tab to TID  -7/25 consulted internal medicine for hyponatremia, -7/26 sodium up to 134 and later 135, internal medicine continuing Lasix 20 mg p.o. daily and mild fluid restriction, appreciate assistance -10/04/22 Na 139, Dr. Jarvis Newcomer of [facility] in  to see her today, suspects pain mediated ADH secretion improvement, if hypoNa recurs then repeat serum osm, Urine Na. D/C lasix since euvolemic, continue fluid restriction of 2L, recheck BMP Monday, consider cortisol AM check if hyperK recurs or hypotension develops. [facility] signed off, appreciate assistance.  7/29--sodium drop to 130 today with unclear cause.   -reduce to 1800 cc Fluid per day  -recheck labs this afternoon including serum osmo and urine Na+  -7/30 hyponatremia appears to be chronic.  asymptomatic continue current restrictions and recheck BMP within the next week with PCP 17. DM2 with hypoglycemia?,  Patient reports she did not have diabetes mellitus but instead was treated for postprandial hypoglycemia after gastric bypass surgery: ?previously on Acarbose 100mg  TID (unclear if patient was taking this) -09/27/22 CBGs variable and episode of hypoglycemia today; continue CBG monitoring and SSI, monitor -7/24 discontinue SSI, continue CBG monitoring but decreased to BID -7/26 few elevated CBGs, continue to monitor -7/ 30 well-controlled continue current regimen  CBG (last 3)  Recent Labs    10/06/22 0618 10/06/22 2316 10/07/22 0524  GLUCAP 102* 144* 98    18. Hx of vitamin D deficiency and hypocalcemia, now with pathologic fx: likely needs DEXA scan, appears that last Vit D level was in 2020 after appropriate replacement therapy, and then was on some form of Calcium-Vit D supplement (500mg  [1250mg ]-200IU dosing?) but had come off it at some point. Calcium low at 8.5 here at admission. Will recheck Vitamin D with Monday labs on 09/29/22, and start on calcium carbonate supplementation 1250mg  every day starting today 7/20, suspect she'll need some form of vitamin D supplementation (daily vs weekly).   -7/23 Vit D 30.14 borderline, will start 1000u daily  19. Rib/sternum pain: hx of sternal fx from fall per PT -09/28/22 felt a pop while supporting her weight, thinks it's a pulled muscle,  feels better upon eval, no deformity or significant tenderness; doubt need for imaging or work up, likely muscular or rib related; will order kpad and lidoderm PRN. Monitor. Activity as tolerated.  -7/22 rib pain improved with interventions above  -7/23 chest wall tenderness today, will increase oxycodone frequency to 10mg  Q4h PRN, will check CXR -7/24 CXR unremarkable,  continue current management -7/25-26 continue current pain medication, controlled overall -10/04/22 changed lidocaine patch to daily rather than PRN, per request with improvement. Still has pain with inspiration 20. Constipation  -7/24 start miralax and senokot daily,sorbitol PRN  -7/26 last BM yesterday, extra-large continue to monitor -10/05/22 pt moving bowel more regularly, now having a little bit less formed stools, requests senokot be changed to PRN -last bm 7/29 improved 21.  Hyperkalemia  -7/26 internal medicine has ordered [facility], continue to monitor  -7/ 30 potassium stable at 4.1  LOS: 12 days A FACE TO FACE EVALUATION WAS PERFORMED  Fanny Dance 10/07/2022, 8:26 AM

## 2022-10-08 ENCOUNTER — Other Ambulatory Visit (HOSPITAL_COMMUNITY): Payer: Self-pay

## 2022-10-08 ENCOUNTER — Telehealth (HOSPITAL_COMMUNITY): Payer: Self-pay

## 2022-10-08 NOTE — Telephone Encounter (Signed)
Pharmacy Patient Advocate Encounter  Received notification from  [employer]  that Prior Authorization for Quetiapine 300mg  has been APPROVED from 09/16/2022 to 10/05/2023. Ran test claim, Copay is $4.00  PA #/Case ID/Reference #: [id number]

## 2022-10-08 NOTE — Telephone Encounter (Signed)
Pharmacy Patient Advocate Encounter  Received notification from  [employer]  that Prior Authorization for Lidoderm 5% patches has been DENIED. Please advise how you'd like to proceed. Full denial letter will be uploaded to the media tab. See denial reason below.  PA #/Case ID/Reference #: [id number] KEY: [id number]

## 2022-10-08 NOTE — Telephone Encounter (Signed)
Pharmacy Patient Advocate Encounter  Received notification from  [employer]  that Prior Authorization for Oxycodone 10mg  has been APPROVED from 10/07/2022 to 04/08/2023. Ran test claim, Copay is $Error message that pt is receiving CII limit  PA #/Case ID/Reference #: [id number] KEY: [id number]

## 2022-10-15 ENCOUNTER — Ambulatory Visit: Payer: MEDICAID | Admitting: Podiatry

## 2022-10-15 DIAGNOSIS — S82851E Displaced trimalleolar fracture of right lower leg, subsequent encounter for open fracture type I or II with routine healing: Secondary | ICD-10-CM

## 2022-10-15 DIAGNOSIS — S82302A Unspecified fracture of lower end of left tibia, initial encounter for closed fracture: Secondary | ICD-10-CM

## 2022-10-15 DIAGNOSIS — S82302D Unspecified fracture of lower end of left tibia, subsequent encounter for closed fracture with routine healing: Secondary | ICD-10-CM

## 2022-10-15 DIAGNOSIS — S82891D Other fracture of right lower leg, subsequent encounter for closed fracture with routine healing: Secondary | ICD-10-CM

## 2022-10-16 ENCOUNTER — Other Ambulatory Visit: Payer: Self-pay | Admitting: Nurse Practitioner

## 2022-10-16 NOTE — Progress Notes (Signed)
  Subjective:  Patient ID: Diana Quinn, female    DOB: May 13, 1965,  MRN: [id number]  Chief Complaint  Patient presents with   Fracture    "This is my follow-up from being in the hospital.  I broke both ankles on the first of July." N - fractured ankle L - bilateral D - 09/08/2022 O - suddenly, little better C - left one throbs, ache, sharp pains: right - tender and aching A - walking, standing T - ER, admitted 29 days, splint right and cast left foot    57 y.o. female presents with the above complaint. History confirmed with patient.  She states the right side is feeling well occasional tenderness, the left side is doing okay but has been painful, she has been in the same cast since he left the hospital.  Objective:  Physical Exam: warm, good capillary refill, no trophic changes or ulcerative lesions, normal DP and PT pulses, normal sensory exam, and minimal pain over minimal LS on the right ankle, her distal left leg and ankle does have some tenderness, cast was removed and skin was inspected there is no evidence of skin breakdown or ulceration. Assessment:   1. Closed fracture of right ankle with routine healing, subsequent encounter   2. Closed fracture of distal end of left tibia with routine healing, unspecified fracture morphology, subsequent encounter      Plan:  Patient was evaluated and treated and all questions answered.  Seems to be progressing and healing well with nonoperative treatment.  We do not have a feasible way to obtain nonweightbearing bilateral x-rays with her due to positioning and the equipment we have in the office for x-rays.  I have ordered outpatient x-rays to be completed at [facility] for her, she will complete these in the next few days, a second set was ordered as well for her to complete prior to her next visit with Dr. Allena Katz for follow-up.  Expected that point we will be able to transition to weightbearing as tolerated in a lace up ankle brace on  the right and weightbearing in the cam boot on the left as long as there is good bony healing.  New whelp below the knee fiberglass cast was applied today.  Return in about 4 weeks (around 11/12/2022) for fracture follow up, cast removal.

## 2022-10-16 NOTE — Telephone Encounter (Signed)
Patient called in regarding the referral to pain clinic, Dr. Mackey Birchwood at [facility]. She also needs her pain meds refilled, she currently has two broken legs.

## 2022-10-21 ENCOUNTER — Other Ambulatory Visit: Payer: Self-pay

## 2022-10-21 ENCOUNTER — Telehealth: Payer: Self-pay | Admitting: Nurse Practitioner

## 2022-10-21 NOTE — Telephone Encounter (Signed)
Patient left voicemail about some Rx's and a referral that were supposed to be sent two months ago. I see about the pain clinic referral but not about the meds. Need to call and see what meds she is referring to.

## 2022-10-22 ENCOUNTER — Encounter: Payer: Self-pay | Admitting: Internal Medicine

## 2022-10-22 ENCOUNTER — Ambulatory Visit
Admission: RE | Admit: 2022-10-22 | Discharge: 2022-10-22 | Disposition: A | Discharge status: Home / Self Care | Payer: MEDICAID | Source: Ambulatory Visit | Attending: Podiatry | Admitting: Podiatry

## 2022-10-22 DIAGNOSIS — S82302D Unspecified fracture of lower end of left tibia, subsequent encounter for closed fracture with routine healing: Secondary | ICD-10-CM

## 2022-10-22 NOTE — Addendum Note (Signed)
Addended by: Grayling Congress on: 10/22/2022 10:59 AM   Modules accepted: Orders

## 2022-10-28 ENCOUNTER — Other Ambulatory Visit: Payer: Self-pay

## 2022-10-28 MED ORDER — FREESTYLE LIBRE 3 SENSOR MISC: 11 refills | Status: DC

## 2022-10-30 ENCOUNTER — Other Ambulatory Visit (HOSPITAL_COMMUNITY): Payer: Self-pay

## 2022-11-03 ENCOUNTER — Telehealth: Payer: Self-pay | Admitting: *Deleted

## 2022-11-03 ENCOUNTER — Telehealth: Payer: Self-pay | Admitting: Nurse Practitioner

## 2022-11-03 ENCOUNTER — Other Ambulatory Visit: Payer: Self-pay | Admitting: Family

## 2022-11-03 NOTE — Telephone Encounter (Signed)
Paper refill request for Ranolazine 100 mg SR tab. This has been declined as patient will not be followed by this office.

## 2022-11-03 NOTE — Telephone Encounter (Signed)
Patient called to see if we will complete the PA for her [place] [place]

## 2022-11-04 ENCOUNTER — Other Ambulatory Visit (HOSPITAL_COMMUNITY): Payer: Self-pay

## 2022-11-04 ENCOUNTER — Other Ambulatory Visit: Payer: Self-pay | Admitting: Nurse Practitioner

## 2022-11-06 ENCOUNTER — Other Ambulatory Visit: Payer: Self-pay | Admitting: Nurse Practitioner

## 2022-11-06 MED ORDER — GLUCOSE BLOOD VI STRP: ORAL_STRIP | 12 refills | Status: DC

## 2022-11-12 ENCOUNTER — Ambulatory Visit
Admission: RE | Admit: 2022-11-12 | Discharge: 2022-11-12 | Disposition: A | Discharge status: Home / Self Care | Payer: MEDICAID | Source: Ambulatory Visit | Attending: Podiatry | Admitting: Podiatry

## 2022-11-12 DIAGNOSIS — S82302D Unspecified fracture of lower end of left tibia, subsequent encounter for closed fracture with routine healing: Secondary | ICD-10-CM

## 2022-11-13 ENCOUNTER — Ambulatory Visit: Payer: MEDICAID | Admitting: Podiatry

## 2022-11-13 DIAGNOSIS — S82891D Other fracture of right lower leg, subsequent encounter for closed fracture with routine healing: Secondary | ICD-10-CM

## 2022-11-13 DIAGNOSIS — S82302D Unspecified fracture of lower end of left tibia, subsequent encounter for closed fracture with routine healing: Secondary | ICD-10-CM

## 2022-11-13 DIAGNOSIS — S82302A Unspecified fracture of lower end of left tibia, initial encounter for closed fracture: Secondary | ICD-10-CM | POA: Diagnosis not present

## 2022-11-13 NOTE — Progress Notes (Signed)
  Subjective:  Patient ID: Diana Quinn, female    DOB: 1965-09-14,  MRN: [id number]  Chief Complaint  Patient presents with   Fracture    57 y.o. female presents with the above complaint. History confirmed with patient.  She said both sides are doing well.  The left side does not have any tenderness the right side is doing really good.  She has been wearing boot on the right side.  Objective:  Physical Exam: warm, good capillary refill, no trophic changes or ulcerative lesions, normal DP and PT pulses, normal sensory exam, and minimal pain over minimal LS on the right ankle, her distal left leg and ankle does have some tenderness, cast was removed and skin was inspected there is no evidence of skin breakdown or ulceration. Assessment:   No diagnosis found.    Plan:  Patient was evaluated and treated and all questions answered.  Seems to be progressing and healing well with nonoperative treatment.  X-rays were reviewed that was done at [facility].  Healing fracture sites noted.  No worsening of the fracture site noted.  Adequate alignment.  Patient will be transition to cam boot on the left side and Tri-Lock ankle brace on the right side.  Tri-Lock ankle brace was dispensed.  She will see Dr. Abbott Pao back again in 4 weeks for final follow-up  No follow-ups on file.

## 2022-11-29 ENCOUNTER — Other Ambulatory Visit (HOSPITAL_COMMUNITY): Payer: Self-pay

## 2022-12-08 ENCOUNTER — Other Ambulatory Visit (HOSPITAL_COMMUNITY): Payer: Self-pay

## 2022-12-10 ENCOUNTER — Ambulatory Visit: Payer: MEDICAID | Admitting: Podiatry

## 2022-12-10 DIAGNOSIS — Z91199 Patient's noncompliance with other medical treatment and regimen due to unspecified reason: Secondary | ICD-10-CM

## 2022-12-11 NOTE — Progress Notes (Signed)
Patient was no-show for appointment today 

## 2022-12-15 ENCOUNTER — Other Ambulatory Visit (HOSPITAL_COMMUNITY): Payer: Self-pay

## 2023-01-05 ENCOUNTER — Other Ambulatory Visit (HOSPITAL_COMMUNITY): Payer: Self-pay

## 2023-01-06 ENCOUNTER — Other Ambulatory Visit: Payer: Self-pay

## 2023-01-08 ENCOUNTER — Other Ambulatory Visit (HOSPITAL_COMMUNITY): Payer: Self-pay

## 2023-01-08 ENCOUNTER — Other Ambulatory Visit: Payer: Self-pay | Admitting: Family

## 2023-01-09 ENCOUNTER — Other Ambulatory Visit (HOSPITAL_COMMUNITY): Payer: Self-pay

## 2023-01-09 ENCOUNTER — Other Ambulatory Visit: Payer: Self-pay | Admitting: Nurse Practitioner

## 2023-01-22 ENCOUNTER — Other Ambulatory Visit: Payer: Self-pay | Admitting: Family

## 2023-01-22 ENCOUNTER — Ambulatory Visit: Payer: MEDICAID | Admitting: Family

## 2023-01-22 ENCOUNTER — Encounter: Payer: Self-pay | Admitting: Family

## 2023-01-22 VITALS — BP 100/62 | HR 58 | Ht 64.0 in | Wt 190.4 lb

## 2023-01-22 DIAGNOSIS — E782 Mixed hyperlipidemia: Secondary | ICD-10-CM

## 2023-01-22 DIAGNOSIS — G8929 Other chronic pain: Secondary | ICD-10-CM

## 2023-01-22 DIAGNOSIS — Z0130 Encounter for examination of blood pressure without abnormal findings: Secondary | ICD-10-CM

## 2023-01-22 DIAGNOSIS — M7918 Myalgia, other site: Secondary | ICD-10-CM

## 2023-01-22 DIAGNOSIS — E1165 Type 2 diabetes mellitus with hyperglycemia: Secondary | ICD-10-CM | POA: Diagnosis not present

## 2023-01-22 DIAGNOSIS — E538 Deficiency of other specified B group vitamins: Secondary | ICD-10-CM

## 2023-01-22 DIAGNOSIS — D509 Iron deficiency anemia, unspecified: Secondary | ICD-10-CM

## 2023-01-22 DIAGNOSIS — M5442 Lumbago with sciatica, left side: Secondary | ICD-10-CM | POA: Diagnosis not present

## 2023-01-22 DIAGNOSIS — E559 Vitamin D deficiency, unspecified: Secondary | ICD-10-CM | POA: Diagnosis not present

## 2023-01-22 DIAGNOSIS — F3162 Bipolar disorder, current episode mixed, moderate: Secondary | ICD-10-CM

## 2023-01-22 DIAGNOSIS — M5441 Lumbago with sciatica, right side: Secondary | ICD-10-CM

## 2023-01-22 DIAGNOSIS — E039 Hypothyroidism, unspecified: Secondary | ICD-10-CM

## 2023-01-22 LAB — POC CREATINE & ALBUMIN,URINE
Creatinine, POC: 300 mg/dL
Microalbumin Ur, POC: 150 mg/L

## 2023-01-22 LAB — GLUCOSE, POCT (MANUAL RESULT ENTRY): POC Glucose: 127 mg/dL — AB (ref 70–99)

## 2023-01-22 MED ORDER — UBRELVY 100 MG PO TABS: 100.0000 mg | ORAL_TABLET | ORAL | 6 refills | Status: DC | PRN

## 2023-01-23 LAB — CBC WITH DIFFERENTIAL/PLATELET
Basophils Absolute: 0.1 10*3/uL (ref 0.0–0.2)
Basos: 1 %
EOS (ABSOLUTE): 0.1 10*3/uL (ref 0.0–0.4)
Eos: 1 %
Hematocrit: 41.5 % (ref 34.0–46.6)
Hemoglobin: 13.4 g/dL (ref 11.1–15.9)
Immature Grans (Abs): 0 10*3/uL (ref 0.0–0.1)
Immature Granulocytes: 0 %
Lymphocytes Absolute: 2 10*3/uL (ref 0.7–3.1)
Lymphs: 38 %
MCH: 32 pg (ref 26.6–33.0)
MCHC: 32.3 g/dL (ref 31.5–35.7)
MCV: 99 fL — ABNORMAL HIGH (ref 79–97)
Monocytes Absolute: 0.5 10*3/uL (ref 0.1–0.9)
Monocytes: 9 %
Neutrophils Absolute: 2.7 10*3/uL (ref 1.4–7.0)
Neutrophils: 51 %
Platelets: 314 10*3/uL (ref 150–450)
RBC: 4.19 x10E6/uL (ref 3.77–5.28)
RDW: 12 % (ref 11.7–15.4)
WBC: 5.3 10*3/uL (ref 3.4–10.8)

## 2023-01-23 LAB — CMP14+EGFR
ALT: 19 [IU]/L (ref 0–32)
AST: 28 [IU]/L (ref 0–40)
Albumin: 4 g/dL (ref 3.8–4.9)
Alkaline Phosphatase: 239 [IU]/L — ABNORMAL HIGH (ref 44–121)
BUN/Creatinine Ratio: 10 (ref 9–23)
BUN: 10 mg/dL (ref 6–24)
Bilirubin Total: 0.4 mg/dL (ref 0.0–1.2)
CO2: 20 mmol/L (ref 20–29)
Calcium: 8.9 mg/dL (ref 8.7–10.2)
Chloride: 109 mmol/L — ABNORMAL HIGH (ref 96–106)
Creatinine, Ser: 0.96 mg/dL (ref 0.57–1.00)
Globulin, Total: 2.3 g/dL (ref 1.5–4.5)
Glucose: 124 mg/dL — ABNORMAL HIGH (ref 70–99)
Potassium: 4.3 mmol/L (ref 3.5–5.2)
Sodium: 143 mmol/L (ref 134–144)
Total Protein: 6.3 g/dL (ref 6.0–8.5)
eGFR: 70 mL/min/{1.73_m2} (ref >59)

## 2023-01-23 LAB — HEMOGLOBIN A1C
Est. average glucose Bld gHb Est-mCnc: 123 mg/dL
Hgb A1c MFr Bld: 5.9 % — ABNORMAL HIGH (ref 4.8–5.6)

## 2023-01-23 LAB — LIPID PANEL
Chol/HDL Ratio: 1.6 ratio (ref 0.0–4.4)
Cholesterol, Total: 148 mg/dL (ref 100–199)
HDL: 92 mg/dL (ref >39)
LDL Chol Calc (NIH): 39 mg/dL (ref 0–99)
Triglycerides: 95 mg/dL (ref 0–149)
VLDL Cholesterol Cal: 17 mg/dL (ref 5–40)

## 2023-01-23 LAB — TSH: TSH: 0.218 u[IU]/mL — ABNORMAL LOW (ref 0.450–4.500)

## 2023-01-23 LAB — VITAMIN D 25 HYDROXY (VIT D DEFICIENCY, FRACTURES): Vit D, 25-Hydroxy: 26.2 ng/mL — ABNORMAL LOW (ref 30.0–100.0)

## 2023-01-23 LAB — VITAMIN B12: Vitamin B-12: 640 pg/mL (ref 232–1245)

## 2023-01-26 ENCOUNTER — Other Ambulatory Visit: Payer: Self-pay | Admitting: Family

## 2023-01-26 ENCOUNTER — Other Ambulatory Visit (HOSPITAL_COMMUNITY): Payer: Self-pay

## 2023-01-27 ENCOUNTER — Other Ambulatory Visit (HOSPITAL_COMMUNITY): Payer: Self-pay

## 2023-01-27 ENCOUNTER — Other Ambulatory Visit: Payer: Self-pay

## 2023-01-27 ENCOUNTER — Other Ambulatory Visit: Payer: Self-pay | Admitting: Family

## 2023-01-28 ENCOUNTER — Other Ambulatory Visit: Payer: Self-pay

## 2023-01-28 ENCOUNTER — Other Ambulatory Visit (HOSPITAL_COMMUNITY): Payer: Self-pay

## 2023-01-28 MED ORDER — TRAZODONE HCL 150 MG PO TABS
300.0000 mg | ORAL_TABLET | Freq: Every day | ORAL | 0 refills | Status: DC
  Filled 2023-01-28: qty 60, 30d supply, fill #0

## 2023-02-02 MED ORDER — PANTOPRAZOLE SODIUM 40 MG PO TBEC: 40.0000 mg | DELAYED_RELEASE_TABLET | Freq: Every day | ORAL | 0 refills | Status: DC

## 2023-02-02 MED ORDER — LEVOTHYROXINE SODIUM 112 MCG PO TABS: 112.0000 ug | ORAL_TABLET | Freq: Every day | ORAL | 1 refills | Status: DC

## 2023-02-02 MED ORDER — ERGOCALCIFEROL 1.25 MG (50000 UT) PO CAPS: 50000.0000 [IU] | ORAL_CAPSULE | ORAL | 3 refills | Status: DC

## 2023-03-22 ENCOUNTER — Encounter: Payer: Self-pay | Admitting: Family

## 2023-03-22 NOTE — Assessment & Plan Note (Signed)
 Checking labs today.  Continue current therapy for lipid control. Will modify as needed based on labwork results.

## 2023-03-22 NOTE — Assessment & Plan Note (Signed)
 Checking labs today. Will call pt. With results  Continue current diabetes POC, as patient has been well controlled on current regimen.  Will adjust meds if needed based on labs.

## 2023-03-22 NOTE — Assessment & Plan Note (Signed)
 Patient is seen by Psychiatry, who manage this condition.  She is well controlled with current therapy.   Will defer to them for further changes to plan of care.

## 2023-03-22 NOTE — Assessment & Plan Note (Signed)
 Checking labs today.  Will continue supplements as needed.

## 2023-04-28 ENCOUNTER — Other Ambulatory Visit: Payer: Self-pay

## 2023-04-28 ENCOUNTER — Other Ambulatory Visit (HOSPITAL_COMMUNITY): Payer: Self-pay

## 2023-04-28 MED ORDER — APIXABAN 2.5 MG PO TABS
2.5000 mg | ORAL_TABLET | Freq: Two times a day (BID) | ORAL | 0 refills | Status: DC
  Filled 2023-04-28: qty 60, 30d supply, fill #0

## 2023-04-30 ENCOUNTER — Other Ambulatory Visit (HOSPITAL_COMMUNITY): Payer: Self-pay

## 2023-05-07 ENCOUNTER — Other Ambulatory Visit: Payer: Self-pay

## 2023-05-07 MED ORDER — PANTOPRAZOLE SODIUM 40 MG PO TBEC: 40.0000 mg | DELAYED_RELEASE_TABLET | Freq: Every day | ORAL | 0 refills | Status: DC

## 2023-05-08 ENCOUNTER — Telehealth: Payer: Self-pay | Admitting: Family

## 2023-05-08 NOTE — Telephone Encounter (Signed)
 Diana Quinn with [facility] in [place] left VM needing to know the max # of migraines a month for patient's Diana Quinn Rx for [employer] purposes.   Max # of migraines a month is 10. Called and informed Diana Quinn of this.

## 2023-05-13 ENCOUNTER — Other Ambulatory Visit: Payer: Self-pay | Admitting: Family

## 2023-05-13 ENCOUNTER — Other Ambulatory Visit (HOSPITAL_COMMUNITY): Payer: Self-pay

## 2023-05-13 ENCOUNTER — Telehealth: Payer: MEDICAID

## 2023-05-14 ENCOUNTER — Other Ambulatory Visit: Payer: Self-pay | Admitting: Family

## 2023-05-14 ENCOUNTER — Other Ambulatory Visit (HOSPITAL_COMMUNITY): Payer: Self-pay

## 2023-05-14 ENCOUNTER — Encounter: Payer: Self-pay | Admitting: Oncology

## 2023-05-14 MED ORDER — QUETIAPINE FUMARATE 100 MG PO TABS
300.0000 mg | ORAL_TABLET | Freq: Every day | ORAL | 0 refills | Status: DC
  Filled 2023-05-14 – 2023-05-25 (×2): qty 90, 30d supply, fill #0

## 2023-05-14 MED ORDER — VITAMIN D3 25 MCG PO TABS
1000.0000 [IU] | ORAL_TABLET | Freq: Every day | ORAL | 0 refills | Status: DC
  Filled 2023-05-14 – 2023-05-25 (×2): qty 30, 30d supply, fill #0

## 2023-05-14 MED ORDER — ATORVASTATIN CALCIUM 40 MG PO TABS
40.0000 mg | ORAL_TABLET | Freq: Every day | ORAL | 0 refills | Status: DC
  Filled 2023-05-14 – 2023-07-04 (×6): qty 30, 30d supply, fill #0

## 2023-05-15 ENCOUNTER — Other Ambulatory Visit (HOSPITAL_COMMUNITY): Payer: Self-pay

## 2023-05-15 ENCOUNTER — Other Ambulatory Visit: Payer: Self-pay

## 2023-05-15 ENCOUNTER — Encounter: Payer: Self-pay | Admitting: Pharmacist

## 2023-05-15 MED ORDER — PROPRANOLOL HCL 20 MG PO TABS
20.0000 mg | ORAL_TABLET | Freq: Two times a day (BID) | ORAL | 0 refills | Status: DC
  Filled 2023-05-15 – 2023-06-02 (×4): qty 180, 90d supply, fill #0

## 2023-05-15 MED ORDER — OXCARBAZEPINE 300 MG PO TABS
600.0000 mg | ORAL_TABLET | Freq: Two times a day (BID) | ORAL | 1 refills | Status: DC
  Filled 2023-05-15 – 2023-07-04 (×5): qty 360, 90d supply, fill #0

## 2023-05-15 MED ORDER — PANTOPRAZOLE SODIUM 40 MG PO TBEC
40.0000 mg | DELAYED_RELEASE_TABLET | Freq: Every day | ORAL | 0 refills | Status: DC
  Filled 2023-05-15 – 2023-05-25 (×3): qty 90, 90d supply, fill #0

## 2023-05-15 MED ORDER — TRAZODONE HCL 150 MG PO TABS
300.0000 mg | ORAL_TABLET | Freq: Every day | ORAL | 0 refills | Status: DC
Start: 2023-05-15 — End: 2023-07-04
  Filled 2023-05-15 – 2023-05-25 (×3): qty 60, 30d supply, fill #0

## 2023-05-15 MED ORDER — TOPIRAMATE ER 200 MG PO CAP24
1.0000 | ORAL_CAPSULE | Freq: Every day | ORAL | 1 refills | Status: DC
Start: 2023-05-15 — End: 2023-11-17
  Filled 2023-05-15 – 2023-07-04 (×5): qty 90, 90d supply, fill #0

## 2023-05-15 MED ORDER — RANOLAZINE ER 1000 MG PO TB12
1000.0000 mg | ORAL_TABLET | Freq: Two times a day (BID) | ORAL | 1 refills | Status: DC
  Filled 2023-05-15: qty 180, 90d supply, fill #0
  Filled 2023-06-02 – 2023-07-04 (×3): qty 180, 90d supply, fill #1

## 2023-05-15 MED ORDER — CAPLYTA 42 MG PO CAPS
42.0000 mg | ORAL_CAPSULE | Freq: Every day | ORAL | 2 refills | Status: AC
  Filled 2023-05-15 – 2023-05-30 (×3): qty 90, 90d supply, fill #0
  Filled 2023-06-20 – 2023-07-04 (×2): qty 90, 90d supply, fill #1

## 2023-05-15 MED ORDER — APIXABAN 2.5 MG PO TABS
2.5000 mg | ORAL_TABLET | Freq: Two times a day (BID) | ORAL | 0 refills | Status: DC
  Filled 2023-05-15 – 2023-05-25 (×2): qty 60, 30d supply, fill #0

## 2023-05-15 MED ORDER — GVOKE HYPOPEN 2-PACK 1 MG/0.2ML ~~LOC~~ SOAJ
1.0000 mg | SUBCUTANEOUS | 2 refills | Status: AC | PRN
  Filled 2023-05-15: qty 0.4, 28d supply, fill #0
  Filled 2023-05-25: qty 0.4, 7d supply, fill #0
  Filled 2023-06-02 – 2023-07-04 (×3): qty 0.4, 7d supply, fill #1

## 2023-05-16 ENCOUNTER — Encounter: Payer: Self-pay | Admitting: Oncology

## 2023-05-16 ENCOUNTER — Other Ambulatory Visit (HOSPITAL_COMMUNITY): Payer: Self-pay

## 2023-05-16 ENCOUNTER — Other Ambulatory Visit (HOSPITAL_BASED_OUTPATIENT_CLINIC_OR_DEPARTMENT_OTHER): Payer: Self-pay

## 2023-05-18 ENCOUNTER — Other Ambulatory Visit (HOSPITAL_COMMUNITY): Payer: Self-pay

## 2023-05-18 ENCOUNTER — Other Ambulatory Visit: Payer: Self-pay

## 2023-05-19 ENCOUNTER — Other Ambulatory Visit (HOSPITAL_COMMUNITY): Payer: Self-pay

## 2023-05-19 ENCOUNTER — Other Ambulatory Visit: Payer: Self-pay | Admitting: Family

## 2023-05-19 ENCOUNTER — Other Ambulatory Visit: Payer: Self-pay

## 2023-05-19 DIAGNOSIS — G8929 Other chronic pain: Secondary | ICD-10-CM

## 2023-05-19 MED ORDER — PAROXETINE HCL 40 MG PO TABS
40.0000 mg | ORAL_TABLET | Freq: Every day | ORAL | 0 refills | Status: AC
  Filled 2023-05-19 – 2023-07-04 (×6): qty 30, 30d supply, fill #0

## 2023-05-19 MED ORDER — PRAZOSIN HCL 1 MG PO CAPS
2.0000 mg | ORAL_CAPSULE | Freq: Every day | ORAL | 0 refills | Status: DC
  Filled 2023-05-19: qty 60, 30d supply, fill #0

## 2023-05-22 ENCOUNTER — Ambulatory Visit: Payer: MEDICAID | Admitting: Family

## 2023-05-25 ENCOUNTER — Other Ambulatory Visit (HOSPITAL_COMMUNITY): Payer: Self-pay

## 2023-05-25 ENCOUNTER — Encounter: Payer: Self-pay | Admitting: Oncology

## 2023-05-30 ENCOUNTER — Other Ambulatory Visit (HOSPITAL_COMMUNITY): Payer: Self-pay

## 2023-05-30 ENCOUNTER — Other Ambulatory Visit: Payer: Self-pay | Admitting: Internal Medicine

## 2023-06-01 ENCOUNTER — Other Ambulatory Visit (HOSPITAL_COMMUNITY): Payer: Self-pay

## 2023-06-01 MED ORDER — PRAZOSIN HCL 1 MG PO CAPS
2.0000 mg | ORAL_CAPSULE | Freq: Every day | ORAL | 0 refills | Status: DC
  Filled 2023-06-01 – 2023-06-02 (×2): qty 60, 30d supply, fill #0

## 2023-06-02 ENCOUNTER — Other Ambulatory Visit: Payer: Self-pay

## 2023-06-02 ENCOUNTER — Other Ambulatory Visit (HOSPITAL_COMMUNITY): Payer: Self-pay

## 2023-06-03 ENCOUNTER — Other Ambulatory Visit (HOSPITAL_COMMUNITY): Payer: Self-pay

## 2023-06-03 ENCOUNTER — Encounter: Payer: Self-pay | Admitting: Pharmacist

## 2023-06-03 ENCOUNTER — Other Ambulatory Visit: Payer: Self-pay

## 2023-06-08 ENCOUNTER — Other Ambulatory Visit: Payer: Self-pay

## 2023-06-10 ENCOUNTER — Encounter: Payer: Self-pay | Admitting: Family

## 2023-06-10 ENCOUNTER — Ambulatory Visit (INDEPENDENT_AMBULATORY_CARE_PROVIDER_SITE_OTHER): Payer: MEDICAID | Admitting: Family

## 2023-06-10 VITALS — BP 110/72 | HR 65 | Ht 63.0 in | Wt 195.0 lb

## 2023-06-10 DIAGNOSIS — G894 Chronic pain syndrome: Secondary | ICD-10-CM | POA: Diagnosis not present

## 2023-06-10 DIAGNOSIS — M7918 Myalgia, other site: Secondary | ICD-10-CM | POA: Diagnosis not present

## 2023-06-10 DIAGNOSIS — M5441 Lumbago with sciatica, right side: Secondary | ICD-10-CM | POA: Diagnosis not present

## 2023-06-10 DIAGNOSIS — Z0130 Encounter for examination of blood pressure without abnormal findings: Secondary | ICD-10-CM

## 2023-06-10 DIAGNOSIS — G8929 Other chronic pain: Secondary | ICD-10-CM

## 2023-06-10 DIAGNOSIS — M5442 Lumbago with sciatica, left side: Secondary | ICD-10-CM | POA: Diagnosis not present

## 2023-06-10 MED ORDER — METOCLOPRAMIDE HCL 10 MG PO TABS: 10.0000 mg | ORAL_TABLET | Freq: Four times a day (QID) | ORAL | 1 refills | Status: DC | PRN

## 2023-06-10 MED ORDER — TIZANIDINE HCL 4 MG PO TABS: 8.0000 mg | ORAL_TABLET | Freq: Three times a day (TID) | ORAL | 1 refills | Status: DC

## 2023-06-10 MED ORDER — FAMOTIDINE 20 MG PO TABS: 20.0000 mg | ORAL_TABLET | Freq: Two times a day (BID) | ORAL | 1 refills | Status: DC

## 2023-06-10 MED ORDER — SUCRALFATE 1 G PO TABS: 1.0000 g | ORAL_TABLET | Freq: Four times a day (QID) | ORAL | 3 refills | Status: AC

## 2023-06-10 MED ORDER — CETIRIZINE HCL 10 MG PO TABS: 10.0000 mg | ORAL_TABLET | Freq: Every day | ORAL | 2 refills | Status: DC

## 2023-06-10 NOTE — Progress Notes (Signed)
 Established Patient Office Visit  Subjective:  Patient ID: Diana Quinn, female    DOB: 06-12-1965  Age: 58 y.o. MRN: [id number]  Chief Complaint  Patient presents with   Follow-up    Follow up Needs labs   Needs referral     1) Cetirizine refill 2) was at her son's house, had taken an Ibuprofen.  Ended up having to take Reglan and Carafate. Asks if we can get a refill for her.  3) Now that she knows she can't take NSAIDs. She has been taking tylenol.  She says that the ct scan in [place] showed moderate anterior wedge fracture.  Hurts from mid back to sciatica (left side).  Says that the 4 mg of Tizanidine has not been helping.  8 mg of Tizanidine was her previous dose, she was doing well with that.     No other concerns at this time.   Past Medical History:  Diagnosis Date   Abdominal pain, acute, right upper quadrant 12/12/2014   Abnormal weight loss 05/10/2014   Acute metabolic encephalopathy 09/23/2022   Allergic reaction 09/17/2016   AMS (altered mental status) 06/12/2023   Anemia    Ankle fracture 09/10/2022   Appetite loss 01/14/2015   Bilateral ankle fractures 09/25/2022   CAD (coronary artery disease) 08/10/2020   Closed fracture of left distal tibia 09/10/2022   Closed torus fracture of upper end of right fibula 04/13/2019   E. coli UTI 09/18/2022   EKG, abnormal 02/27/2013   Elevated lithium level 05/10/2014   Gastric bypass status for obesity    H/O unstable angina    History of seizure 09/24/2022   Hx SBO 09/30/2014   Hypoglycemia    Hypokalemia 10/03/2022   Hyponatremia 09/17/2022   MI (myocardial infarction) [facility])    Migraine headache 03/24/2011   Note: Unchanged - sees [facility], Dr. Vickey Sages  Note: Unchanged - sees [facility], Dr. Halford Decamp of this note might be different from the original.  Note: Unchanged - sees [facility] neurolog[facility], Dr. Vickey Sages     Occipital headache (Fourth Area of Pain) (Bilateral) (L>R) 03/31/2018   Orthostatic  hypotension    Osteoporosis    PEG tube malfunction (HCC) 01/13/2015   Poisoning by unspecified drugs, medicaments and biological substances, accidental (unintentional), initial encounter 02/27/2013   Polysubstance abuse (HCC) 11/30/2016   SBO (small bowel obstruction) (HCC) 09/23/2014   Sciatic nerve disease    Seizures (HCC)    Skin infection at gastrostomy tube site (HCC) 12/29/2014   Suicidal behavior 02/27/2013   Thyroid disease    TIA (transient ischemic attack) 04/22/2017    Past Surgical History:  Procedure Laterality Date   ABDOMINAL ADHESION SURGERY     ABDOMINAL HYSTERECTOMY     CHOLECYSTECTOMY     DILATION AND CURETTAGE OF UTERUS     ESOPHAGOGASTRODUODENOSCOPY     GASTRIC BYPASS     GASTRIC BYPASS OPEN     revision   GASTROSTOMY W/ FEEDING TUBE     HERNIA REPAIR     OOPHORECTOMY     SPLENECTOMY     TONSILLECTOMY      Social History   Socioeconomic History   Marital status: Widowed    Spouse name: Not on file   Number of children: Not on file   Years of education: Not on file   Highest education level: Not on file  Occupational History   Not on file  Tobacco Use   Smoking status: Never  Smokeless tobacco: Never  Vaping Use   Vaping status: Never Used  Substance and Sexual Activity   Alcohol use: Not Currently   Drug use: Never   Sexual activity: Not on file  Other Topics Concern   Not on file  Social History Narrative   Not on file   Social Drivers of Health   Financial Resource Strain: Not on file  Food Insecurity: No Food Insecurity (06/13/2023)   Hunger Vital Sign    Worried About Running Out of Food in the Last Year: Never true    Ran Out of Food in the Last Year: Never true  Transportation Needs: No Transportation Needs (06/13/2023)   PRAPARE - Administrator, Civil Service (Medical): No    Lack of Transportation (Non-Medical): No  Physical Activity: Not on file  Stress: Not on file  Social Connections: Socially Isolated  (06/13/2023)   Social Connection and Isolation Panel [NHANES]    Frequency of Communication with Friends and Family: More than three times a week    Frequency of Social Gatherings with Friends and Family: More than three times a week    Attends Religious Services: Never    Database administrator or Organizations: No    Attends Banker Meetings: Never    Marital Status: Widowed  Intimate Partner Violence: Not At Risk (06/13/2023)   Humiliation, Afraid, Rape, and Kick questionnaire    Fear of Current or Ex-Partner: No    Emotionally Abused: No    Physically Abused: No    Sexually Abused: No    Family History  Problem Relation Age of Onset   Cancer Mother    Hypertension Mother    COPD Mother    Asthma Sister    Psoriasis Sister    Epilepsy Brother     Allergies  Allergen Reactions   Depakote [Divalproex Sodium] Shortness Of Breath   Pregabalin Other (See Comments), Rash and Shortness Of Breath    Other Reaction: falls; decreased coordination  Pt ts that she passes out when she takes this med.   Other Reaction: falls; decreased coordination    Pt ts that she passes out when she takes this med.  Other Reaction: falls; decreased coordination, , Pt ts that she passes out when she takes this med.   Valproic Acid Other (See Comments) and Shortness Of Breath    Other Reaction: Other reaction  SOB   Levetiracetam Itching and Rash    Review of Systems  All other systems reviewed and are negative.      Objective:   BP 110/72   Pulse 65   Ht 5\' 3"  (1.6 m)   Wt 195 lb (88.5 kg)   SpO2 97%   BMI 34.54 kg/m   Vitals:   06/10/23 1429  BP: 110/72  Pulse: 65  Height: 5\' 3"  (1.6 m)  Weight: 195 lb (88.5 kg)  SpO2: 97%  BMI (Calculated): 34.55    Physical Exam Vitals and nursing note reviewed.  Constitutional:      Appearance: Normal appearance. She is normal weight.  HENT:     Head: Normocephalic and atraumatic.     Right Ear: Tympanic membrane  normal.     Left Ear: Tympanic membrane normal.  Eyes:     Extraocular Movements: Extraocular movements intact.     Conjunctiva/sclera: Conjunctivae normal.     Pupils: Pupils are equal, round, and reactive to light.  Cardiovascular:     Rate and Rhythm: Normal rate and  regular rhythm.     Pulses: Normal pulses.  Pulmonary:     Effort: Pulmonary effort is normal.  Musculoskeletal:     Cervical back: Normal range of motion.  Neurological:     General: No focal deficit present.     Mental Status: She is alert. Mental status is at baseline. She is disoriented.  Psychiatric:        Mood and Affect: Mood normal.        Behavior: Behavior normal.        Thought Content: Thought content normal.        Judgment: Judgment normal.      No results found for any visits on 06/10/23.  Recent Results (from the past 2160 hours)  CBG monitoring, ED     Status: None   Collection Time: 06/12/23 10:20 AM  Result Value Ref Range   Glucose-Capillary 95 70 - 99 mg/dL    Comment: Glucose reference range applies only to samples taken after fasting for at least 8 hours.  Urine Drug Screen, Qualitative ([facility] only)     Status: Abnormal   Collection Time: 06/12/23 10:28 AM  Result Value Ref Range   Tricyclic, Ur Screen POSITIVE (A) NONE DETECTED   Amphetamines, Ur Screen NONE DETECTED NONE DETECTED   MDMA (Ecstasy)Ur Screen NONE DETECTED NONE DETECTED   Cocaine Metabolite,Ur [place] NONE DETECTED NONE DETECTED   Opiate, Ur Screen NONE DETECTED NONE DETECTED   Phencyclidine (PCP) Ur S NONE DETECTED NONE DETECTED   Cannabinoid 50 Ng, Ur [place] NONE DETECTED NONE DETECTED   Barbiturates, Ur Screen POSITIVE (A) NONE DETECTED   Benzodiazepine, Ur Scrn NONE DETECTED NONE DETECTED   Methadone Scn, Ur NONE DETECTED NONE DETECTED    Comment: (NOTE) Tricyclics + metabolites, urine    Cutoff 1000 ng/mL Amphetamines + metabolites, urine  Cutoff 1000 ng/mL MDMA (Ecstasy), urine              Cutoff 500 ng/mL Cocaine  Metabolite, urine          Cutoff 300 ng/mL Opiate + metabolites, urine        Cutoff 300 ng/mL Phencyclidine (PCP), urine         Cutoff 25 ng/mL Cannabinoid, urine                 Cutoff 50 ng/mL Barbiturates + metabolites, urine  Cutoff 200 ng/mL Benzodiazepine, urine              Cutoff 200 ng/mL Methadone, urine                   Cutoff 300 ng/mL  The urine drug screen provides only a preliminary, unconfirmed analytical test result and should not be used for non-medical purposes. Clinical consideration and professional judgment should be applied to any positive drug screen result due to possible interfering substances. A more specific alternate chemical method must be used in order to obtain a confirmed analytical result. Gas chromatography / mass spectrometry (GC/MS) is the preferred confirm atory method. Performed at [facility], [address] [place], [place] [address]   Urinalysis, w/ Reflex to Culture (Infection Suspected) -Urine, Clean Catch     Status: Abnormal   Collection Time: 06/12/23 10:28 AM  Result Value Ref Range   Specimen Source URINE, CLEAN CATCH    Color, Urine YELLOW (A) YELLOW   APPearance CLEAR (A) CLEAR   Specific Gravity, Urine 1.029 1.005 - 1.030   pH 6.0 5.0 - 8.0  Glucose, UA NEGATIVE NEGATIVE mg/dL   Hgb urine dipstick NEGATIVE NEGATIVE   Bilirubin Urine SMALL (A) NEGATIVE   Ketones, ur NEGATIVE NEGATIVE mg/dL   Protein, ur NEGATIVE NEGATIVE mg/dL   Nitrite NEGATIVE NEGATIVE   Leukocytes,Ua NEGATIVE NEGATIVE   RBC / HPF 0-5 0 - 5 RBC/hpf   WBC, UA 0-5 0 - 5 WBC/hpf    Comment:        Reflex urine culture not performed if WBC <=10, OR if Squamous epithelial cells >5. If Squamous epithelial cells >5 suggest recollection.    Bacteria, UA NONE SEEN NONE SEEN   Squamous Epithelial / HPF 0-5 0 - 5 /HPF   Mucus PRESENT    Hyaline Casts, UA PRESENT     Comment: Performed at [facility], [address] [place],  [place] [address]  CBC     Status: Abnormal   Collection Time: 06/12/23 10:29 AM  Result Value Ref Range   WBC 12.2 (H) 4.0 - 10.5 K/uL   RBC 3.79 (L) 3.87 - 5.11 MIL/uL   Hemoglobin 12.1 12.0 - 15.0 g/dL   HCT 60.4 54.0 - 98.1 %   MCV 97.6 80.0 - 100.0 fL   MCH 31.9 26.0 - 34.0 pg   MCHC 32.7 30.0 - 36.0 g/dL   RDW 19.1 47.8 - 29.5 %   Platelets 401 (H) 150 - 400 K/uL   nRBC 0.0 0.0 - 0.2 %    Comment: Performed at [facility], [address], [place], [place] [address]  Comprehensive metabolic panel     Status: Abnormal   Collection Time: 06/12/23 10:29 AM  Result Value Ref Range   Sodium 138 135 - 145 mmol/L   Potassium 3.3 (L) 3.5 - 5.1 mmol/L   Chloride 109 98 - 111 mmol/L   CO2 22 22 - 32 mmol/L   Glucose, Bld 75 70 - 99 mg/dL    Comment: Glucose reference range applies only to samples taken after fasting for at least 8 hours.   BUN 18 6 - 20 mg/dL   Creatinine, Ser 8.65 0.44 - 1.00 mg/dL   Calcium 8.4 (L) 8.9 - 10.3 mg/dL   Total Protein 6.7 6.5 - 8.1 g/dL   Albumin 3.6 3.5 - 5.0 g/dL   AST 23 15 - 41 U/L   ALT 18 0 - 44 U/L   Alkaline Phosphatase 182 (H) 38 - 126 U/L   Total Bilirubin 0.3 0.0 - 1.2 mg/dL   GFR, Estimated >78 >46 mL/min    Comment: (NOTE) Calculated using the CKD-EPI Creatinine Equation (2021)    Anion gap 7 5 - 15    Comment: Performed at [facility], [address] [place], [place] [address]  Ammonia     Status: None   Collection Time: 06/12/23 10:29 AM  Result Value Ref Range   Ammonia 24 9 - 35 umol/L    Comment: Performed at [facility], [address] [place], [place] [address]  Ethanol     Status: None   Collection Time: 06/12/23 10:29 AM  Result Value Ref Range   Alcohol, Ethyl (B) <10 <10 mg/dL    Comment: (NOTE) Lowest detectable limit for serum alcohol is 10 mg/dL.  For medical purposes only. Performed at [facility], [address] [place], [place] [address]   Acetaminophen level      Status: Abnormal   Collection Time: 06/12/23 10:29 AM  Result Value Ref Range   Acetaminophen (Tylenol), Serum <10 (L)  10 - 30 ug/mL    Comment: (NOTE) Therapeutic concentrations vary significantly. A range of 10-30 ug/mL  may be an effective concentration for many patients. However, some  are best treated at concentrations outside of this range. Acetaminophen concentrations >150 ug/mL at 4 hours after ingestion  and >50 ug/mL at 12 hours after ingestion are often associated with  toxic reactions.  Performed at [facility], [address] [place], [place] [address]   Salicylate level     Status: Abnormal   Collection Time: 06/12/23 10:29 AM  Result Value Ref Range   Salicylate Lvl <7.0 (L) 7.0 - 30.0 mg/dL    Comment: Performed at [facility], [address] [place], [place] [address]  Lactic acid, plasma     Status: None   Collection Time: 06/12/23 10:29 AM  Result Value Ref Range   Lactic Acid, Venous 1.8 0.5 - 1.9 mmol/L    Comment: Performed at [facility], [address] [place], [place] [address]  TSH     Status: None   Collection Time: 06/12/23 10:29 AM  Result Value Ref Range   TSH 2.431 0.350 - 4.500 uIU/mL    Comment: Performed by a 3rd Generation assay with a functional sensitivity of <=0.01 uIU/mL. Performed at [facility], [address] [place], [place] [address]   Magnesium     Status: None   Collection Time: 06/12/23 10:29 AM  Result Value Ref Range   Magnesium 2.1 1.7 - 2.4 mg/dL    Comment: Performed at [facility], [address] [place], [place] [address]  CK     Status: None   Collection Time: 06/12/23 10:29 AM  Result Value Ref Range   Total CK 71 38 - 234 U/L    Comment: Performed at [facility], [address] [place], [place] [address]  CBG monitoring, ED     Status: Abnormal   Collection Time: 06/12/23  4:52 PM  Result Value Ref Range   Glucose-Capillary 124 (H) 70 - 99 mg/dL     Comment: Glucose reference range applies only to samples taken after fasting for at least 8 hours.  CBG monitoring, ED     Status: None   Collection Time: 06/12/23 10:07 PM  Result Value Ref Range   Glucose-Capillary 73 70 - 99 mg/dL    Comment: Glucose reference range applies only to samples taken after fasting for at least 8 hours.  CBG monitoring, ED     Status: Abnormal   Collection Time: 06/12/23 11:28 PM  Result Value Ref Range   Glucose-Capillary 170 (H) 70 - 99 mg/dL    Comment: Glucose reference range applies only to samples taken after fasting for at least 8 hours.  Basic metabolic panel     Status: Abnormal   Collection Time: 06/13/23  4:13 AM  Result Value Ref Range   Sodium 136 135 - 145 mmol/L   Potassium 4.0 3.5 - 5.1 mmol/L   Chloride 109 98 - 111 mmol/L   CO2 22 22 - 32 mmol/L   Glucose, Bld 88 70 - 99 mg/dL    Comment: Glucose reference range applies only to samples taken after fasting for at least 8 hours.   BUN 14 6 - 20 mg/dL   Creatinine, Ser 3.47 0.44 - 1.00 mg/dL   Calcium 7.9 (L) 8.9 - 10.3 mg/dL   GFR, Estimated >42 >59 mL/min    Comment: (NOTE) Calculated using the CKD-EPI Creatinine Equation (2021)  Anion gap 5 5 - 15    Comment: Performed at [facility], [address] [place], [place] [address]  Magnesium     Status: None   Collection Time: 06/13/23  4:13 AM  Result Value Ref Range   Magnesium 1.9 1.7 - 2.4 mg/dL    Comment: Performed at [facility], [address] [place], [place] [address]  CBG monitoring, ED     Status: Abnormal   Collection Time: 06/13/23  7:48 AM  Result Value Ref Range   Glucose-Capillary 113 (H) 70 - 99 mg/dL    Comment: Glucose reference range applies only to samples taken after fasting for at least 8 hours.  CBG monitoring, ED     Status: Abnormal   Collection Time: 06/13/23 12:04 PM  Result Value Ref Range   Glucose-Capillary 143 (H) 70 - 99 mg/dL    Comment: Glucose reference range  applies only to samples taken after fasting for at least 8 hours.       Assessment & Plan:   Problem List Items Addressed This Visit       Other   Chronic pain syndrome - Primary (Chronic)   Sending the tizanidine refill for patient.  She will let me know if this doesn't continue to improve.      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  She will let me know if she continues to have any issues with her GI system.  Return in about 3 months (around 09/09/2023).   Total time spent: 20 minutes  Miki Kins, FNP  06/10/2023   This document may have been prepared by [facility] Voice Recognition software and as such may include unintentional dictation errors.

## 2023-06-12 ENCOUNTER — Emergency Department: Payer: MEDICAID

## 2023-06-12 ENCOUNTER — Observation Stay
Admission: EM | Admit: 2023-06-12 | Discharge: 2023-06-13 | Disposition: A | Discharge status: Home / Self Care | Payer: MEDICAID | Attending: Internal Medicine | Admitting: Internal Medicine

## 2023-06-12 ENCOUNTER — Other Ambulatory Visit: Payer: Self-pay

## 2023-06-12 DIAGNOSIS — Z8673 Personal history of transient ischemic attack (TIA), and cerebral infarction without residual deficits: Secondary | ICD-10-CM | POA: Insufficient documentation

## 2023-06-12 DIAGNOSIS — E11649 Type 2 diabetes mellitus with hypoglycemia without coma: Secondary | ICD-10-CM | POA: Insufficient documentation

## 2023-06-12 DIAGNOSIS — R410 Disorientation, unspecified: Secondary | ICD-10-CM | POA: Diagnosis not present

## 2023-06-12 DIAGNOSIS — F319 Bipolar disorder, unspecified: Secondary | ICD-10-CM | POA: Diagnosis not present

## 2023-06-12 DIAGNOSIS — G9341 Metabolic encephalopathy: Secondary | ICD-10-CM | POA: Diagnosis not present

## 2023-06-12 DIAGNOSIS — R4182 Altered mental status, unspecified: Principal | ICD-10-CM | POA: Diagnosis present

## 2023-06-12 DIAGNOSIS — W19XXXA Unspecified fall, initial encounter: Secondary | ICD-10-CM

## 2023-06-12 DIAGNOSIS — G40909 Epilepsy, unspecified, not intractable, without status epilepticus: Secondary | ICD-10-CM | POA: Insufficient documentation

## 2023-06-12 DIAGNOSIS — Z8679 Personal history of other diseases of the circulatory system: Secondary | ICD-10-CM | POA: Diagnosis not present

## 2023-06-12 DIAGNOSIS — I2510 Atherosclerotic heart disease of native coronary artery without angina pectoris: Secondary | ICD-10-CM | POA: Diagnosis not present

## 2023-06-12 DIAGNOSIS — Z79899 Other long term (current) drug therapy: Secondary | ICD-10-CM | POA: Diagnosis not present

## 2023-06-12 HISTORY — DX: Altered mental status, unspecified: R41.82

## 2023-06-12 LAB — URINALYSIS, W/ REFLEX TO CULTURE (INFECTION SUSPECTED)
Bacteria, UA: NONE SEEN
Glucose, UA: NEGATIVE mg/dL
Hgb urine dipstick: NEGATIVE
Ketones, ur: NEGATIVE mg/dL
Leukocytes,Ua: NEGATIVE
Nitrite: NEGATIVE
Protein, ur: NEGATIVE mg/dL
Specific Gravity, Urine: 1.029 (ref 1.005–1.030)
pH: 6 (ref 5.0–8.0)

## 2023-06-12 LAB — URINE DRUG SCREEN, QUALITATIVE (ARMC ONLY)
Amphetamines, Ur Screen: NOT DETECTED
Barbiturates, Ur Screen: POSITIVE — AB
Benzodiazepine, Ur Scrn: NOT DETECTED
Cannabinoid 50 Ng, Ur ~~LOC~~: NOT DETECTED
Cocaine Metabolite,Ur ~~LOC~~: NOT DETECTED
MDMA (Ecstasy)Ur Screen: NOT DETECTED
Methadone Scn, Ur: NOT DETECTED
Opiate, Ur Screen: NOT DETECTED
Phencyclidine (PCP) Ur S: NOT DETECTED
Tricyclic, Ur Screen: POSITIVE — AB

## 2023-06-12 LAB — COMPREHENSIVE METABOLIC PANEL WITH GFR
ALT: 18 U/L (ref 0–44)
AST: 23 U/L (ref 15–41)
Albumin: 3.6 g/dL (ref 3.5–5.0)
Alkaline Phosphatase: 182 U/L — ABNORMAL HIGH (ref 38–126)
Anion gap: 7 (ref 5–15)
BUN: 18 mg/dL (ref 6–20)
CO2: 22 mmol/L (ref 22–32)
Calcium: 8.4 mg/dL — ABNORMAL LOW (ref 8.9–10.3)
Chloride: 109 mmol/L (ref 98–111)
Creatinine, Ser: 0.88 mg/dL (ref 0.44–1.00)
GFR, Estimated: >60 mL/min (ref >60)
Glucose, Bld: 75 mg/dL (ref 70–99)
Potassium: 3.3 mmol/L — ABNORMAL LOW (ref 3.5–5.1)
Sodium: 138 mmol/L (ref 135–145)
Total Bilirubin: 0.3 mg/dL (ref 0.0–1.2)
Total Protein: 6.7 g/dL (ref 6.5–8.1)

## 2023-06-12 LAB — ACETAMINOPHEN LEVEL: Acetaminophen (Tylenol), Serum: <10 ug/mL — ABNORMAL LOW (ref 10–30)

## 2023-06-12 LAB — CBG MONITORING, ED
Glucose-Capillary: 95 mg/dL (ref 70–99)
Glucose-Capillary: 124 mg/dL — ABNORMAL HIGH (ref 70–99)
Glucose-Capillary: 73 mg/dL (ref 70–99)

## 2023-06-12 LAB — SALICYLATE LEVEL: Salicylate Lvl: <7 mg/dL — ABNORMAL LOW (ref 7.0–30.0)

## 2023-06-12 LAB — MAGNESIUM: Magnesium: 2.1 mg/dL (ref 1.7–2.4)

## 2023-06-12 LAB — LACTIC ACID, PLASMA: Lactic Acid, Venous: 1.8 mmol/L (ref 0.5–1.9)

## 2023-06-12 LAB — CBC
HCT: 37 % (ref 36.0–46.0)
Hemoglobin: 12.1 g/dL (ref 12.0–15.0)
MCH: 31.9 pg (ref 26.0–34.0)
MCHC: 32.7 g/dL (ref 30.0–36.0)
MCV: 97.6 fL (ref 80.0–100.0)
Platelets: 401 10*3/uL — ABNORMAL HIGH (ref 150–400)
RBC: 3.79 MIL/uL — ABNORMAL LOW (ref 3.87–5.11)
RDW: 13.8 % (ref 11.5–15.5)
WBC: 12.2 10*3/uL — ABNORMAL HIGH (ref 4.0–10.5)
nRBC: 0 % (ref 0.0–0.2)

## 2023-06-12 LAB — AMMONIA: Ammonia: 24 umol/L (ref 9–35)

## 2023-06-12 LAB — TSH: TSH: 2.431 u[IU]/mL (ref 0.350–4.500)

## 2023-06-12 LAB — CK: Total CK: 71 U/L (ref 38–234)

## 2023-06-12 LAB — ETHANOL: Alcohol, Ethyl (B): <10 mg/dL (ref <10)

## 2023-06-12 MED ORDER — METOCLOPRAMIDE HCL 10 MG PO TABS: 10.0000 mg | ORAL_TABLET | Freq: Four times a day (QID) | ORAL | Status: DC | PRN

## 2023-06-12 MED ORDER — APIXABAN 5 MG PO TABS
5.0000 mg | ORAL_TABLET | Freq: Two times a day (BID) | ORAL | Status: DC
  Administered 2023-06-12: 5 mg via ORAL
  Filled 2023-06-12: qty 1

## 2023-06-12 MED ORDER — TOPIRAMATE 100 MG PO TABS
100.0000 mg | ORAL_TABLET | Freq: Two times a day (BID) | ORAL | Status: DC
  Administered 2023-06-12 – 2023-06-13 (×2): 100 mg via ORAL
  Filled 2023-06-12 (×3): qty 1

## 2023-06-12 MED ORDER — PAROXETINE HCL 20 MG PO TABS
40.0000 mg | ORAL_TABLET | Freq: Every day | ORAL | Status: DC
  Administered 2023-06-12 – 2023-06-13 (×2): 40 mg via ORAL
  Filled 2023-06-12 (×3): qty 2

## 2023-06-12 MED ORDER — PANTOPRAZOLE SODIUM 40 MG PO TBEC
40.0000 mg | DELAYED_RELEASE_TABLET | Freq: Every day | ORAL | Status: DC
  Administered 2023-06-12 – 2023-06-13 (×2): 40 mg via ORAL
  Filled 2023-06-12 (×2): qty 1

## 2023-06-12 MED ORDER — CLONAZEPAM 0.5 MG PO TABS: 1.0000 mg | ORAL_TABLET | Freq: Every evening | ORAL | Status: DC | PRN

## 2023-06-12 MED ORDER — SODIUM CHLORIDE 0.9 % IV BOLUS
500.0000 mL | Freq: Once | INTRAVENOUS | Status: AC
  Administered 2023-06-12: 500 mL via INTRAVENOUS

## 2023-06-12 MED ORDER — POTASSIUM CHLORIDE 10 MEQ/100ML IV SOLN
10.0000 meq | Freq: Once | INTRAVENOUS | Status: AC
  Administered 2023-06-12: 10 meq via INTRAVENOUS
  Filled 2023-06-12: qty 100

## 2023-06-12 MED ORDER — OXCARBAZEPINE 300 MG PO TABS
600.0000 mg | ORAL_TABLET | Freq: Two times a day (BID) | ORAL | Status: DC
  Administered 2023-06-12 – 2023-06-13 (×2): 600 mg via ORAL
  Filled 2023-06-12 (×2): qty 2

## 2023-06-12 MED ORDER — ACETAMINOPHEN 325 MG PO TABS: 650.0000 mg | ORAL_TABLET | Freq: Four times a day (QID) | ORAL | Status: DC | PRN

## 2023-06-12 MED ORDER — ATORVASTATIN CALCIUM 20 MG PO TABS
40.0000 mg | ORAL_TABLET | Freq: Every day | ORAL | Status: DC
  Administered 2023-06-12: 40 mg via ORAL
  Filled 2023-06-12: qty 2

## 2023-06-12 MED ORDER — PRAZOSIN HCL 2 MG PO CAPS
2.0000 mg | ORAL_CAPSULE | Freq: Every day | ORAL | Status: DC
  Filled 2023-06-12: qty 1

## 2023-06-12 MED ORDER — RANOLAZINE ER 500 MG PO TB12
1000.0000 mg | ORAL_TABLET | Freq: Two times a day (BID) | ORAL | Status: DC
Start: 2023-06-12 — End: 2023-06-13
  Administered 2023-06-12 – 2023-06-13 (×2): 1000 mg via ORAL
  Filled 2023-06-12 (×3): qty 2

## 2023-06-12 MED ORDER — LORATADINE 10 MG PO TABS
10.0000 mg | ORAL_TABLET | Freq: Every day | ORAL | Status: DC
  Administered 2023-06-12: 10 mg via ORAL
  Filled 2023-06-12 (×2): qty 1

## 2023-06-12 MED ORDER — LUMATEPERONE TOSYLATE 42 MG PO CAPS
42.0000 mg | ORAL_CAPSULE | Freq: Every day | ORAL | Status: DC
  Administered 2023-06-12: 42 mg via ORAL
  Filled 2023-06-12 (×2): qty 1

## 2023-06-12 MED ORDER — QUETIAPINE FUMARATE 300 MG PO TABS
300.0000 mg | ORAL_TABLET | Freq: Every day | ORAL | Status: DC
Start: 2023-06-12 — End: 2023-06-13
  Administered 2023-06-12: 300 mg via ORAL
  Filled 2023-06-12 (×2): qty 1

## 2023-06-12 MED ORDER — FAMOTIDINE 20 MG PO TABS
20.0000 mg | ORAL_TABLET | Freq: Two times a day (BID) | ORAL | Status: DC
  Administered 2023-06-12 – 2023-06-13 (×2): 20 mg via ORAL
  Filled 2023-06-12 (×2): qty 1

## 2023-06-12 MED ORDER — SUCRALFATE 1 G PO TABS
1.0000 g | ORAL_TABLET | Freq: Four times a day (QID) | ORAL | Status: DC
  Administered 2023-06-12 – 2023-06-13 (×4): 1 g via ORAL
  Filled 2023-06-12 (×6): qty 1

## 2023-06-12 MED ORDER — LEVOTHYROXINE SODIUM 112 MCG PO TABS
112.0000 ug | ORAL_TABLET | Freq: Every day | ORAL | Status: DC
  Administered 2023-06-13: 112 ug via ORAL
  Filled 2023-06-12: qty 1

## 2023-06-12 MED ORDER — TRAZODONE HCL 100 MG PO TABS
300.0000 mg | ORAL_TABLET | Freq: Every day | ORAL | Status: DC
  Administered 2023-06-12: 300 mg via ORAL
  Filled 2023-06-12: qty 3

## 2023-06-12 MED ORDER — LINAGLIPTIN 5 MG PO TABS
5.0000 mg | ORAL_TABLET | Freq: Every day | ORAL | Status: DC
Start: 2023-06-13 — End: 2023-06-13
  Administered 2023-06-13: 5 mg via ORAL
  Filled 2023-06-12: qty 1

## 2023-06-12 MED ORDER — INSULIN ASPART 100 UNIT/ML IJ SOLN
0.0000 [IU] | Freq: Three times a day (TID) | INTRAMUSCULAR | Status: DC
  Administered 2023-06-12: 1 [IU] via SUBCUTANEOUS
  Filled 2023-06-12: qty 1

## 2023-06-12 MED ORDER — SODIUM CHLORIDE 0.9 % IV BOLUS
1000.0000 mL | Freq: Once | INTRAVENOUS | Status: AC
  Administered 2023-06-12: 1000 mL via INTRAVENOUS

## 2023-06-12 MED ORDER — APIXABAN 2.5 MG PO TABS
2.5000 mg | ORAL_TABLET | Freq: Two times a day (BID) | ORAL | Status: DC
  Filled 2023-06-12: qty 1

## 2023-06-12 MED ORDER — TIZANIDINE HCL 4 MG PO TABS
8.0000 mg | ORAL_TABLET | Freq: Three times a day (TID) | ORAL | Status: DC
Start: 2023-06-12 — End: 2023-06-13
  Administered 2023-06-12 – 2023-06-13 (×3): 8 mg via ORAL
  Filled 2023-06-12 (×2): qty 2
  Filled 2023-06-12: qty 4

## 2023-06-12 MED ORDER — PROPRANOLOL HCL 20 MG PO TABS
20.0000 mg | ORAL_TABLET | Freq: Two times a day (BID) | ORAL | Status: DC
  Administered 2023-06-13: 20 mg via ORAL
  Filled 2023-06-12 (×2): qty 1

## 2023-06-12 MED ORDER — TOPIRAMATE ER 200 MG PO CAP24: 1.0000 | ORAL_CAPSULE | Freq: Every day | ORAL | Status: DC

## 2023-06-12 NOTE — ED Notes (Signed)
 IV team at bedside

## 2023-06-12 NOTE — Consult Note (Signed)
 [facility] Psychiatric Consult Initial  Patient Name: .Diana Quinn  MRN: [id number]  DOB: Jul 15, 1965  Consult Order details:  Orders (From admission, onward)     Start     Ordered   06/12/23 1541  IP CONSULT TO PSYCHIATRY       Ordering Provider: Emeline General, MD  Provider:  (Not yet assigned)  Question Answer Comment  Location [facility]   Reason for Consult? Polypharmacy, family concerned about pt taking too many anti-depressants and wants to know if we can make some changes to those meds.      06/12/23 1543             Mode of Visit: Tele-visit Virtual Statement:TELE PSYCHIATRY ATTESTATION & CONSENT As the provider for this telehealth consult, I attest that I verified the patient's identity using two separate identifiers, introduced myself to the patient, provided my credentials, disclosed my location, and performed this encounter via a HIPAA-compliant, real-time, face-to-face, two-way, interactive audio and video platform and with the full consent and agreement of the patient (or guardian as applicable.) Patient physical location: [place]. Telehealth provider physical location: home office in state of [facility]   Video start time: 4:25 PM Video end time: 4:45 PM    Psychiatry Consult Evaluation  Service Date: June 12, 2023 LOS:  LOS: 0 days  Chief Complaint "I haven't had much sleep. My son called the hospital or ambulance to come and see me"  Primary Psychiatric Diagnoses  Altered mental status   Assessment  Diana Quinn is a 58 y.o. female presents to [facility] ED on 06/12/2023 at 10:06 AM due to altered mental status.  Patient seen due to possibility of polypharmacy suspected.  Patient is currently prescribed Klonopin 1 mg, Caplyta 42 mg, Trileptal 600 mg, Paxil 40 mg, prazosin 2 mg, propranolol 20 mg, Seroquel 300 mg and trazodone 300 mg.  Patient reports that psychotropic medications have not been adjusted since the last 2 to 3 years.  She reports that  medications are effective with managing her symptoms.  Patient has a past psychiatric history of generalized anxiety disorder, schizophrenia, sleep disturbances, major depressive disorder, and bipolar 1 disorder.  She is currently established with the outpatient provider who oversees her medications.  Patient reports medication compliance.  She reports multiple psychiatric hospitalizations in the past. Patient denies SI, HI, AVH, delusional thought or paranoia, although she reports a history of experiencing psychosis.  Patient reports a history of experiencing paranoia, stating that her paranoid thoughts consist of people talking about her or thinking about her in the wrong way.  Patient also reports a history of AVH, stating that she has auditory hallucinations telling her that she is stupid or is a piece of crap.  She denies command voices.  Patient also reports a history of visual hallucinations, stating that she last experienced visual hallucinations over 1 year ago.  She denies a history of suicide attempt.  Patient is noted lying in the bed, calm, pleasant and willing to engage.  She reports that she came to the hospital because she had a hard time getting her words out, which is evident during this psychiatric evaluation.  Patient noted with repetitive speech and difficulty finding words.  Patient is alert and oriented x 4.  She reports living alone.  She reports some college as her highest level of education.  She reports having a son, who initiated this emergency room visit today.  Patient is alert and oriented x 4 but has had  a recent change in mental status, due to difficulty "finding her words." Patient denies SI, HI or psychosis.  Patient is calm, pleasant and willing to engage.  Patient initially presented to the emergency department for altered mental status and polypharmacy suspected.  Patient's psychotropic medications have not been adjusted within the last 2 to 3 years and patient has been  compliant with medications since without adverse effect.  Altered mental status most likely not attributed to psychotropic medication.   Patient is not an imminent threat to herself or others.  Patient does not meet criteria for inpatient psychiatry.   Diagnoses:  Active Hospital problems: Principal Problem:   AMS (altered mental status) Active Problems:   Acute metabolic encephalopathy    Plan   ## Psychiatric Medication Recommendations:  No medication changes recommended  ## Medical Decision Making Capacity: Not specifically addressed in this encounter  ## Further Work-up:  -- Deferred to ED P, cardiology consult ordered due to elevated QT interval EKG -- most recent EKG on 06/12/2023 had QtC of 520 -- Pertinent labwork reviewed earlier this admission includes: CMP, CBC, acetaminophen and salicylate level, UDS   ## Disposition:-- There are no psychiatric contraindications to discharge at this time  ## Behavioral / Environmental: - No specific recommendations at this time.     ## Safety and Observation Level:  - Based on my clinical evaluation, I estimate the patient to be at low risk of self harm in the current setting. - At this time, we recommend routine. This decision is based on my review of the chart including patient's history and current presentation, interview of the patient, mental status examination, and consideration of suicide risk including evaluating suicidal ideation, plan, intent, suicidal or self-harm behaviors, risk factors, and protective factors. This judgment is based on our ability to directly address suicide risk, implement suicide prevention strategies, and develop a safety plan while the patient is in the clinical setting. Please contact our team if there is a concern that risk level has changed.  CSSR Risk Category:C-SSRS RISK CATEGORY: No Risk  Suicide Risk Assessment: Patient has following modifiable risk factors for suicide: None, which we are  addressing by psychiatrically clear patient. Patient has following non-modifiable or demographic risk factors for suicide: psychiatric hospitalization Patient has the following protective factors against suicide: Access to outpatient mental health care  Thank you for this consult request. Recommendations have been communicated to the primary team.  We will psychiatrically clear patient at this time.   Mcneil Sober, NP       History of Present Illness  Relevant Aspects of Hospital ED Course:  Admitted on 06/12/2023 for altered mental status.   Patient Report:  My son called the ambulance because I was having a hard time finding my words"  Psych ROS:  Depression: Patient has a history of depression Anxiety: Patient has a history of GAD Mania (lifetime and current): Patient with a history of bipolar disorder Psychosis: (lifetime and current): Patient has a history of schizophrenia   ROS   Psychiatric and Social History  Psychiatric History:  Information collected from patient and ED treatment team  Prev Dx/Sx: See above Current Psych Provider: Yes Home Meds (current): See above Previous Med Trials: None Therapy: Yes  Prior Psych Hospitalization: Yes Prior Self Harm: Denies Prior Violence: Denies  Family Psych History: Denies Family Hx suicide: Denies  Social History:  Developmental Hx: Normal Educational Hx: Some college Occupational Hx: Unknown Legal Hx: Denies Living Situation: Lives alone Spiritual Hx: God  Access to weapons/lethal means: Denies  Substance History Alcohol: History of alcohol use disorder Type of alcohol none Last Drink unknown Number of drinks per day NA History of alcohol withdrawal seizures denies History of DT's denies Tobacco: Denies Illicit drugs: Denies Prescription drug abuse: Denies Rehab hx: Yes  Exam Findings  Physical Exam: No abnormalities observed Vital Signs:  Temp:  [98.1 F (36.7 C)-98.5 F (36.9 C)] 98.1 F (36.7 C)  (04/04 1429) Pulse Rate:  [76-91] 78 (04/04 1400) Resp:  [15-22] 17 (04/04 1400) BP: (97-138)/(84-91) 138/84 (04/04 1400) SpO2:  [97 %-100 %] 100 % (04/04 1400) Weight:  [93.6 kg] 93.6 kg (04/04 1023) Blood pressure 138/84, pulse 78, temperature 98.1 F (36.7 C), temperature source Oral, resp. rate 17, height 5\' 3"  (1.6 m), weight 93.6 kg, SpO2 100%. Body mass index is 36.55 kg/m.  Physical Exam  Mental Status Exam: General Appearance: Appropriate  Orientation: Full orientation  Memory: Recent remote and immediate good  Concentration: Fair  Recall: Good  Attention fair  Eye Contact: Good  Speech: Repetitive speech  Language: Good  Volume: Normal  Mood: Good  Affect: Congruent  Thought Process: Coherent  Thought Content: Logical  Suicidal Thoughts: No  Homicidal Thoughts: No  Judgement: Good  Insight: Good  Psychomotor Activity:  Normal  Akathisia:  No  Fund of Knowledge:  Good      Assets:  Communication Skills Desire for Improvement Housing  Cognition:  WNL  ADL's:  Intact  AIMS (if indicated):        Other History   These have been pulled in through the EMR, reviewed, and updated if appropriate.  Family History:  The patient's family history includes Asthma in her sister; COPD in her mother; Cancer in her mother; Epilepsy in her brother; Hypertension in her mother; Psoriasis in her sister.  Medical History: Past Medical History:  Diagnosis Date   Abdominal pain, acute, right upper quadrant 12/12/2014   Abnormal weight loss 05/10/2014   Allergic reaction 09/17/2016   Anemia    Ankle fracture 09/10/2022   Appetite loss 01/14/2015   Bilateral ankle fractures 09/25/2022   CAD (coronary artery disease) 08/10/2020   Closed fracture of left distal tibia 09/10/2022   Closed torus fracture of upper end of right fibula 04/13/2019   E. coli UTI 09/18/2022   EKG, abnormal 02/27/2013   Elevated lithium level 05/10/2014   Gastric bypass status for obesity    H/O  unstable angina    History of seizure 09/24/2022   Hx SBO 09/30/2014   Hypoglycemia    Hypokalemia 10/03/2022   Hyponatremia 09/17/2022   MI (myocardial infarction) [facility])    Migraine headache 03/24/2011   Note: Unchanged - sees [facility] neurology, Dr. Vickey Sages  Note: Unchanged - sees [facility] neurolog[facility], Dr. Halford Decamp of this note might be different from the original.  Note: Unchanged - sees [facility] neurolog[facility], Dr. Vickey Sages     Occipital headache (Fourth Area of Pain) (Bilateral) (L>R) 03/31/2018   Orthostatic hypotension    Osteoporosis    PEG tube malfunction (HCC) 01/13/2015   Poisoning by unspecified drugs, medicaments and biological substances, accidental (unintentional), initial encounter 02/27/2013   Polysubstance abuse (HCC) 11/30/2016   SBO (small bowel obstruction) (HCC) 09/23/2014   Sciatic nerve disease    Seizures (HCC)    Skin infection at gastrostomy tube site (HCC) 12/29/2014   Suicidal behavior 02/27/2013   Thyroid disease    TIA (transient ischemic attack) 04/22/2017    Surgical History:  Past Surgical History:  Procedure Laterality Date   ABDOMINAL ADHESION SURGERY     ABDOMINAL HYSTERECTOMY     CHOLECYSTECTOMY     DILATION AND CURETTAGE OF UTERUS     ESOPHAGOGASTRODUODENOSCOPY     GASTRIC BYPASS     GASTRIC BYPASS OPEN     revision   GASTROSTOMY W/ FEEDING TUBE     HERNIA REPAIR     OOPHORECTOMY     SPLENECTOMY     TONSILLECTOMY       Medications:   Current Facility-Administered Medications:    acetaminophen (TYLENOL) tablet 650 mg, 650 mg, Oral, Q6H PRN, Emeline General, MD   apixaban (ELIQUIS) tablet 2.5 mg, 2.5 mg, Oral, BID, Mikey College T, MD   atorvastatin (LIPITOR) tablet 40 mg, 40 mg, Oral, Daily, Zhang, Ilda Foil T, MD   clonazePAM (KLONOPIN) tablet 1 mg, 1 mg, Oral, QHS PRN, Emeline General, MD   famotidine (PEPCID) tablet 20 mg, 20 mg, Oral, BID, Zhang, Ping T, MD   insulin aspart (novoLOG) injection 0-9 Units, 0-9 Units, Subcutaneous, TID WC, Emeline General, MD, 1 Units at 06/12/23 1656   [START ON 06/13/2023] levothyroxine (SYNTHROID) tablet 112 mcg, 112 mcg, Oral, Q0600, Emeline General, MD   linagliptin (TRADJENTA) tablet 5 mg, 5 mg, Oral, Daily, Zhang, Ping T, MD   loratadine (CLARITIN) tablet 10 mg, 10 mg, Oral, Daily, Zhang, Ilda Foil T, MD   lumateperone tosylate (CAPLYTA) capsule 42 mg, 42 mg, Oral, Daily, Zhang, Ilda Foil T, MD   metoCLOPramide (REGLAN) tablet 10 mg, 10 mg, Oral, Q6H PRN, Emeline General, MD   Oxcarbazepine (TRILEPTAL) tablet 600 mg, 600 mg, Oral, BID, Zhang, Renae Fickle, MD   pantoprazole (PROTONIX) EC tablet 40 mg, 40 mg, Oral, Daily, Mikey College T, MD, 40 mg at 06/12/23 1649   PARoxetine (PAXIL) tablet 40 mg, 40 mg, Oral, Daily, Zhang, Ilda Foil T, MD   prazosin (MINIPRESS) capsule 2 mg, 2 mg, Oral, QHS, Zhang, Renae Fickle, MD   propranolol (INDERAL) tablet 20 mg, 20 mg, Oral, BID, Zhang, Ping T, MD   QUEtiapine (SEROQUEL) tablet 300 mg, 300 mg, Oral, QHS, Zhang, Renae Fickle, MD   ranolazine (RANEXA) 12 hr tablet 1,000 mg, 1,000 mg, Oral, BID, Zhang, Ilda Foil T, MD   sucralfate (CARAFATE) tablet 1 g, 1 g, Oral, QID, Zhang, Renae Fickle, MD   tiZANidine (ZANAFLEX) tablet 8 mg, 8 mg, Oral, TID, Mikey College T, MD, 8 mg at 06/12/23 1649   topiramate (TOPAMAX) tablet 100 mg, 100 mg, Oral, BID, Orson Aloe, RPH   traZODone (DESYREL) tablet 300 mg, 300 mg, Oral, QHS, Zhang, Renae Fickle, MD  Current Outpatient Medications:    acetaminophen (TYLENOL) 325 MG tablet, Take 2 tablets (650 mg total) by mouth every 6 (six) hours as needed for mild pain or moderate pain., Disp: , Rfl:    apixaban (ELIQUIS) 2.5 MG TABS tablet, Take 1 tablet (2.5 mg total) by mouth 2 (two) times daily., Disp: 60 tablet, Rfl: 0   atorvastatin (LIPITOR) 40 MG tablet, Take 1 tablet (40 mg total) by mouth daily., Disp: 30 tablet, Rfl: 0   BELSOMRA 20 MG TABS, Take 1 tablet by mouth at bedtime as needed., Disp: , Rfl:    CAPLYTA 42 MG capsule, Take 1 capsule (42 mg total) by mouth daily.,  Disp: 90 capsule, Rfl: 2   cetirizine (ZYRTEC ALLERGY) 10 MG tablet, Take 1 tablet (10 mg total) by mouth daily., Disp: 30 tablet, Rfl: 2   clonazePAM (  KLONOPIN) 1 MG tablet, Take 1 mg by mouth at bedtime as needed., Disp: , Rfl:    ergocalciferol (VITAMIN D2) 1.25 MG (50000 UT) capsule, Take 1 capsule (50,000 Units total) by mouth once a week., Disp: 12 capsule, Rfl: 3   levothyroxine (SYNTHROID) 112 MCG tablet, Take 1 tablet (112 mcg total) by mouth daily before breakfast., Disp: 90 tablet, Rfl: 1   Multiple Vitamin (MULTIVITAMIN WITH MINERALS) TABS tablet, Take 1 tablet by mouth daily., Disp: , Rfl:    Oxcarbazepine (TRILEPTAL) 300 MG tablet, Take 2 tablets (600 mg total) by mouth 2 (two) times daily., Disp: 360 tablet, Rfl: 1   pantoprazole (PROTONIX) 40 MG tablet, Take 1 tablet (40 mg total) by mouth daily., Disp: 90 tablet, Rfl: 0   PARoxetine (PAXIL) 40 MG tablet, Take 1 tablet (40 mg total) by mouth daily., Disp: 30 tablet, Rfl: 0   prazosin (MINIPRESS) 1 MG capsule, Take 2 capsules (2 mg total) by mouth at bedtime., Disp: 60 capsule, Rfl: 0   propranolol (INDERAL) 20 MG tablet, Take 1 tablet (20 mg total) by mouth 2 (two) times daily., Disp: 180 tablet, Rfl: 0   QUEtiapine (SEROQUEL) 100 MG tablet, Take 3 tablets (300 mg total) by mouth at bedtime., Disp: 90 tablet, Rfl: 0   ranolazine (RANEXA) 1000 MG SR tablet, Take 1 tablet (1,000 mg total) by mouth 2 (two) times daily., Disp: 180 tablet, Rfl: 1   tiZANidine (ZANAFLEX) 4 MG tablet, Take 2 tablets (8 mg total) by mouth 3 (three) times daily., Disp: 180 tablet, Rfl: 1   Topiramate ER (TROKENDI XR) 200 MG CP24, Take 1 capsule (200 mg total) by mouth daily., Disp: 90 capsule, Rfl: 1   traZODone (DESYREL) 150 MG tablet, Take 2 tablets (300 mg total) by mouth at bedtime., Disp: 60 tablet, Rfl: 0   Ubrogepant (UBRELVY) 100 MG TABS, Take 1 tablet (100 mg total) by mouth as needed., Disp: 16 tablet, Rfl: 6   vitamin D3 (CHOLECALCIFEROL) 25 MCG  tablet, Take 1 tablet (1,000 Units total) by mouth daily., Disp: 30 tablet, Rfl: 0   Continuous Glucose Sensor (FREESTYLE LIBRE 3 SENSOR) MISC, Place 1 sensor on the skin every 14 days. Use to check glucose continuously, Disp: 2 each, Rfl: 11   cyanocobalamin (VITAMIN B12) 1000 MCG/ML injection, Inject 1 mL (1,000 mcg total) into the muscle every 30 (thirty) days. (Patient not taking: Reported on 06/12/2023), Disp: 1 mL, Rfl: 11   famotidine (PEPCID) 20 MG tablet, Take 1 tablet (20 mg total) by mouth 2 (two) times daily., Disp: 60 tablet, Rfl: 1   glucose blood test strip, Use as instructed, Disp: 100 each, Rfl: 12   GVOKE HYPOPEN 2-PACK 1 MG/0.2ML SOAJ, Inject 1 mg into the skin as needed (hypoglycemia)., Disp: 0.4 mL, Rfl: 2   metFORMIN (GLUCOPHAGE) 500 MG tablet, Take 500 mg by mouth 2 (two) times daily., Disp: , Rfl:    metoCLOPramide (REGLAN) 10 MG tablet, Take 1 tablet (10 mg total) by mouth every 6 (six) hours as needed for nausea., Disp: 120 tablet, Rfl: 1   rosuvastatin (CRESTOR) 20 MG tablet, Take 20 mg by mouth at bedtime., Disp: , Rfl:    sucralfate (CARAFATE) 1 g tablet, Take 1 tablet (1 g total) by mouth 4 (four) times daily., Disp: 120 tablet, Rfl: 3  Allergies: Allergies  Allergen Reactions   Depakote [Divalproex Sodium] Shortness Of Breath   Pregabalin Other (See Comments), Rash and Shortness Of Breath    Other Reaction: falls;  decreased coordination  Pt ts that she passes out when she takes this med.   Other Reaction: falls; decreased coordination    Pt ts that she passes out when she takes this med.  Other Reaction: falls; decreased coordination, , Pt ts that she passes out when she takes this med.   Valproic Acid Other (See Comments) and Shortness Of Breath    Other Reaction: Other reaction  SOB   Levetiracetam Itching and Rash    Mcneil Sober, NP

## 2023-06-12 NOTE — ED Notes (Signed)
 BS rechecked 170 mg/dl.

## 2023-06-12 NOTE — H&P (Addendum)
 History and Physical    Diana Quinn ZOX:[id number] DOB: 11-11-1965 DOA: 06/12/2023  PCP: Miki Kins, FNP (Confirm with patient/family/NH records and if not entered, this has to be entered at [facility] point of entry) Patient coming from: Home  I have personally briefly reviewed patient's old medical records in [facility]  Chief Complaint: AMS  HPI: Diana Quinn is a 58 y.o. female with medical history significant of IIDM, HTN, anxiety/depression, nonobstructive CAD, bipolar disorder, seizure disorder, status post gastric bypass for obesity, brought in by family member for worsening of altered mentation.  Patient is rather confused and provided limited information, most of history provided by son over the phone.  Family is concerned that the patient has been taking " too much psychiatric medications".  Patient and family reported that patient has had diabetes used to be diet controlled until recently, when patient traveled to [place] she had episode of glucose more than 400.  Patient does have a habit of binge eating, son reported that she usually only eats 1 meal a day but which contains huge amount of calorie and her sugar tends to run high immediately after as she wears a wearable Freeby glucometer with strips.  This week, she was started on metformin and patient appeared to be tolerated it very well denied any common side effects such as bloating or diarrhea or abdominal pain.  However 2 days ago, patient was found lethargic in the morning and son checked her sugar was 50.  Patient was given soda and some crackers and glucose improved to 70.  This morning, again was found the patient is lethargic however this time her glucose was 110s.  And son reporting patient has been talking repetitively to herself.  Patient however denied any visual or auditory hallucinations.  Patient denied any numbness weakness of the her limbs. ED Course: Afebrile, nontachycardic and not hypotensive not hypoxic,  glucose 75, K3.3 creatinine 0.8 BUN 18 WBC 12 UA negative for UTI.  Chest x-ray negative for acute infiltrates  Patient was given IV fluid bolus 1000 mL and 1 dose of KCl p.o. replacement.  Review of Systems:  Past Medical History:  Diagnosis Date   Abdominal pain, acute, right upper quadrant 12/12/2014   Abnormal weight loss 05/10/2014   Allergic reaction 09/17/2016   Anemia    Ankle fracture 09/10/2022   Appetite loss 01/14/2015   Bilateral ankle fractures 09/25/2022   CAD (coronary artery disease) 08/10/2020   Closed fracture of left distal tibia 09/10/2022   Closed torus fracture of upper end of right fibula 04/13/2019   E. coli UTI 09/18/2022   EKG, abnormal 02/27/2013   Elevated lithium level 05/10/2014   Gastric bypass status for obesity    H/O unstable angina    History of seizure 09/24/2022   Hx SBO 09/30/2014   Hypoglycemia    Hypokalemia 10/03/2022   Hyponatremia 09/17/2022   MI (myocardial infarction) [facility])    Migraine headache 03/24/2011   Note: Unchanged - sees [facility] neurology, Dr. Vickey Sages  Note: Unchanged - sees [facility] neurology, Dr. Halford Decamp of this note might be different from the original.  Note: Unchanged - sees [facility] neurology, Dr. Vickey Sages     Occipital headache (Fourth Area of Pain) (Bilateral) (L>R) 03/31/2018   Orthostatic hypotension    Osteoporosis    PEG tube malfunction (HCC) 01/13/2015   Poisoning by unspecified drugs, medicaments and biological substances, accidental (unintentional), initial encounter 02/27/2013   Polysubstance abuse (HCC) 11/30/2016  SBO (small bowel obstruction) (HCC) 09/23/2014   Sciatic nerve disease    Seizures (HCC)    Skin infection at gastrostomy tube site (HCC) 12/29/2014   Suicidal behavior 02/27/2013   Thyroid disease    TIA (transient ischemic attack) 04/22/2017    Past Surgical History:  Procedure Laterality Date   ABDOMINAL ADHESION SURGERY     ABDOMINAL HYSTERECTOMY     CHOLECYSTECTOMY     DILATION  AND CURETTAGE OF UTERUS     ESOPHAGOGASTRODUODENOSCOPY     GASTRIC BYPASS     GASTRIC BYPASS OPEN     revision   GASTROSTOMY W/ FEEDING TUBE     HERNIA REPAIR     OOPHORECTOMY     SPLENECTOMY     TONSILLECTOMY       reports that she has never smoked. She has never used smokeless tobacco. She reports that she does not currently use alcohol. She reports that she does not use drugs.  Allergies  Allergen Reactions   Depakote [Divalproex Sodium] Shortness Of Breath   Pregabalin Other (See Comments), Rash and Shortness Of Breath    Other Reaction: falls; decreased coordination  Pt ts that she passes out when she takes this med.   Other Reaction: falls; decreased coordination    Pt ts that she passes out when she takes this med.  Other Reaction: falls; decreased coordination, , Pt ts that she passes out when she takes this med.   Valproic Acid Other (See Comments) and Shortness Of Breath    Other Reaction: Other reaction  SOB   Levetiracetam Itching and Rash    Family History  Problem Relation Age of Onset   Cancer Mother    Hypertension Mother    COPD Mother    Asthma Sister    Psoriasis Sister    Epilepsy Brother      Prior to Admission medications   Medication Sig Start Date End Date Taking? Authorizing Provider  acetaminophen (TYLENOL) 325 MG tablet Take 2 tablets (650 mg total) by mouth every 6 (six) hours as needed for mild pain or moderate pain. 09/25/22  Yes Wieting, Richard, MD  apixaban (ELIQUIS) 2.5 MG TABS tablet Take 1 tablet (2.5 mg total) by mouth 2 (two) times daily. 05/15/23  Yes Miki Kins, FNP  atorvastatin (LIPITOR) 40 MG tablet Take 1 tablet (40 mg total) by mouth daily. 05/14/23  Yes Miki Kins, FNP  BELSOMRA 20 MG TABS Take 1 tablet by mouth at bedtime as needed. 10/09/22  Yes [provider]  CAPLYTA 42 MG capsule Take 1 capsule (42 mg total) by mouth daily. 05/15/23  Yes Miki Kins, FNP  cetirizine (ZYRTEC ALLERGY) 10 MG  tablet Take 1 tablet (10 mg total) by mouth daily. 06/10/23 06/09/24 Yes Miki Kins, FNP  clonazePAM (KLONOPIN) 1 MG tablet Take 1 mg by mouth at bedtime as needed. 09/28/22  Yes [provider]  ergocalciferol (VITAMIN D2) 1.25 MG (50000 UT) capsule Take 1 capsule (50,000 Units total) by mouth once a week. 02/02/23  Yes Miki Kins, FNP  levothyroxine (SYNTHROID) 112 MCG tablet Take 1 tablet (112 mcg total) by mouth daily before breakfast. 02/02/23  Yes Miki Kins, FNP  Multiple Vitamin (MULTIVITAMIN WITH MINERALS) TABS tablet Take 1 tablet by mouth daily. 09/26/22  Yes Wieting, Richard, MD  Oxcarbazepine (TRILEPTAL) 300 MG tablet Take 2 tablets (600 mg total) by mouth 2 (two) times daily. 05/15/23  Yes Miki Kins, FNP  pantoprazole (PROTONIX) 40  MG tablet Take 1 tablet (40 mg total) by mouth daily. 05/15/23  Yes Miki Kins, FNP  PARoxetine (PAXIL) 40 MG tablet Take 1 tablet (40 mg total) by mouth daily. 05/19/23  Yes Margaretann Loveless, MD  prazosin (MINIPRESS) 1 MG capsule Take 2 capsules (2 mg total) by mouth at bedtime. 06/01/23  Yes Margaretann Loveless, MD  propranolol (INDERAL) 20 MG tablet Take 1 tablet (20 mg total) by mouth 2 (two) times daily. 05/15/23  Yes Miki Kins, FNP  QUEtiapine (SEROQUEL) 100 MG tablet Take 3 tablets (300 mg total) by mouth at bedtime. 05/14/23  Yes Miki Kins, FNP  ranolazine (RANEXA) 1000 MG SR tablet Take 1 tablet (1,000 mg total) by mouth 2 (two) times daily. 05/15/23  Yes Miki Kins, FNP  tiZANidine (ZANAFLEX) 4 MG tablet Take 2 tablets (8 mg total) by mouth 3 (three) times daily. 06/10/23  Yes Miki Kins, FNP  Topiramate ER (TROKENDI XR) 200 MG CP24 Take 1 capsule (200 mg total) by mouth daily. 05/15/23  Yes Miki Kins, FNP  traZODone (DESYREL) 150 MG tablet Take 2 tablets (300 mg total) by mouth at bedtime. 05/15/23  Yes Miki Kins, FNP  Ubrogepant (UBRELVY) 100 MG TABS Take 1 tablet (100 mg total) by mouth  as needed. 01/22/23  Yes Miki Kins, FNP  vitamin D3 (CHOLECALCIFEROL) 25 MCG tablet Take 1 tablet (1,000 Units total) by mouth daily. 05/14/23  Yes Miki Kins, FNP  Continuous Glucose Sensor (FREESTYLE LIBRE 3 SENSOR) MISC Place 1 sensor on the skin every 14 days. Use to check glucose continuously 10/28/22   Miki Kins, FNP  cyanocobalamin (VITAMIN B12) 1000 MCG/ML injection Inject 1 mL (1,000 mcg total) into the muscle every 30 (thirty) days. Patient not taking: Reported on 06/12/2023 08/25/22   Miki Kins, FNP  famotidine (PEPCID) 20 MG tablet Take 1 tablet (20 mg total) by mouth 2 (two) times daily. 06/10/23 06/09/24  Miki Kins, FNP  glucose blood test strip Use as instructed 11/06/22   Miki Kins, FNP  GVOKE HYPOPEN 2-PACK 1 MG/0.2ML SOAJ Inject 1 mg into the skin as needed (hypoglycemia). 05/15/23   Miki Kins, FNP  metFORMIN (GLUCOPHAGE) 500 MG tablet Take 500 mg by mouth 2 (two) times daily. 06/09/23   [provider]  metoCLOPramide (REGLAN) 10 MG tablet Take 1 tablet (10 mg total) by mouth every 6 (six) hours as needed for nausea. 06/10/23   Miki Kins, FNP  rosuvastatin (CRESTOR) 20 MG tablet Take 20 mg by mouth at bedtime. 06/09/23   [provider]  sucralfate (CARAFATE) 1 g tablet Take 1 tablet (1 g total) by mouth 4 (four) times daily. 06/10/23   Miki Kins, FNP    Physical Exam: Vitals:   06/12/23 1230 06/12/23 1300 06/12/23 1400 06/12/23 1429  BP: (!) 127/91 121/89 138/84   Pulse: 89 80 78   Resp: 20 15 17    Temp:    98.1 F (36.7 C)  TempSrc:    Oral  SpO2: 100% 99% 100%   Weight:      Height:        Constitutional: NAD, calm, comfortable Vitals:   06/12/23 1230 06/12/23 1300 06/12/23 1400 06/12/23 1429  BP: (!) 127/91 121/89 138/84   Pulse: 89 80 78   Resp: 20 15 17    Temp:    98.1 F (36.7 C)  TempSrc:    Oral  SpO2: 100% 99%  100%   Weight:      Height:       Eyes: PERRL, lids and conjunctivae  normal ENMT: Mucous membranes are moist. Posterior pharynx clear of any exudate or lesions.Normal dentition.  Neck: normal, supple, no masses, no thyromegaly Respiratory: clear to auscultation bilaterally, no wheezing, no crackles. Normal respiratory effort. No accessory muscle use.  Cardiovascular: Regular rate and rhythm, no murmurs / rubs / gallops. No extremity edema. 2+ pedal pulses. No carotid bruits.  Abdomen: no tenderness, no masses palpated. No hepatosplenomegaly. Bowel sounds positive.  Musculoskeletal: no clubbing / cyanosis. No joint deformity upper and lower extremities. Good ROM, no contractures. Normal muscle tone.  Skin: no rashes, lesions, ulcers. No induration Neurologic: CN 2-12 grossly intact. Sensation intact, DTR normal. Strength 5/5 in all 4.  Psychiatric: Awake, oriented to person and place, confused about time    Labs on Admission: I have personally reviewed following labs and imaging studies  CBC: Recent Labs  Lab 06/12/23 1029  WBC 12.2*  HGB 12.1  HCT 37.0  MCV 97.6  PLT 401*   Basic Metabolic Panel: Recent Labs  Lab 06/12/23 1029  NA 138  K 3.3*  CL 109  CO2 22  GLUCOSE 75  BUN 18  CREATININE 0.88  CALCIUM 8.4*  MG 2.1   GFR: Estimated Creatinine Clearance: 78.6 mL/min (by C-G formula based on SCr of 0.88 mg/dL). Liver Function Tests: Recent Labs  Lab 06/12/23 1029  AST 23  ALT 18  ALKPHOS 182*  BILITOT 0.3  PROT 6.7  ALBUMIN 3.6   No results for input(s): "LIPASE", "AMYLASE" in the last 168 hours. Recent Labs  Lab 06/12/23 1029  AMMONIA 24   Coagulation Profile: No results for input(s): "INR", "PROTIME" in the last 168 hours. Cardiac Enzymes: Recent Labs  Lab 06/12/23 1029  CKTOTAL 71   BNP (last 3 results) No results for input(s): "PROBNP" in the last 8760 hours. HbA1C: No results for input(s): "HGBA1C" in the last 72 hours. CBG: Recent Labs  Lab 06/12/23 1020  GLUCAP 95   Lipid Profile: No results for  input(s): "CHOL", "HDL", "LDLCALC", "TRIG", "CHOLHDL", "LDLDIRECT" in the last 72 hours. Thyroid Function Tests: Recent Labs    06/12/23 1029  TSH 2.431   Anemia Panel: No results for input(s): "VITAMINB12", "FOLATE", "FERRITIN", "TIBC", "IRON", "RETICCTPCT" in the last 72 hours. Urine analysis:    Component Value Date/Time   COLORURINE YELLOW (A) 06/12/2023 1028   APPEARANCEUR CLEAR (A) 06/12/2023 1028   APPEARANCEUR Clear 09/30/2011 1945   LABSPEC 1.029 06/12/2023 1028   LABSPEC 1.018 09/30/2011 1945   PHURINE 6.0 06/12/2023 1028   GLUCOSEU NEGATIVE 06/12/2023 1028   GLUCOSEU Negative 09/30/2011 1945   HGBUR NEGATIVE 06/12/2023 1028   BILIRUBINUR SMALL (A) 06/12/2023 1028   BILIRUBINUR Negative 09/30/2011 1945   KETONESUR NEGATIVE 06/12/2023 1028   PROTEINUR NEGATIVE 06/12/2023 1028   NITRITE NEGATIVE 06/12/2023 1028   LEUKOCYTESUR NEGATIVE 06/12/2023 1028   LEUKOCYTESUR Negative 09/30/2011 1945    Radiological Exams on Admission: CT Head Wo Contrast Result Date: 06/12/2023 CLINICAL DATA:  Provided history: Mental status change, unknown cause. Fall. Neck trauma, intoxicated obtunded. EXAM: CT HEAD WITHOUT CONTRAST CT CERVICAL SPINE WITHOUT CONTRAST TECHNIQUE: Multidetector CT imaging of the head and cervical spine was performed following the standard protocol without intravenous contrast. Multiplanar CT image reconstructions of the cervical spine were also generated. RADIATION DOSE REDUCTION: This exam was performed according to the departmental dose-optimization program which includes automated exposure control, adjustment of  the mA and/or kV according to patient size and/or use of iterative reconstruction technique. COMPARISON:  Head CT 10/10/2021.  Cervical spine CT 10/10/2021. FINDINGS: CT HEAD FINDINGS Brain: There is no acute intracranial hemorrhage. No demarcated cortical infarct. No extra-axial fluid collection. No evidence of an intracranial mass. No midline shift. Vascular:  No hyperdense vessel. Atherosclerotic calcifications. Skull: No calvarial fracture or aggressive osseous lesion. Sinuses/Orbits: No mass or acute finding within the imaged orbits. Small fluid level within the left maxillary sinus at the imaged levels. CT CERVICAL SPINE FINDINGS Alignment: Nonspecific reversal of the expected cervical lordosis. Dextrocurvature of the cervical spine. No significant spondylolisthesis. Skull base and vertebrae: The basion-dental and atlanto-dental intervals are maintained.No evidence of acute fracture to the cervical spine. Soft tissues and spinal canal: No prevertebral fluid or swelling. No visible canal hematoma. Disc levels: Cervical spondylosis with multilevel disc space narrowing, disc bulges/central disc protrusions, posterior disc osteophyte complexes, endplate spurring and uncovertebral hypertrophy. Disc space narrowing is greatest at C5-C6 and C6-C7 (moderate at these levels). No appreciable high-grade spinal canal stenosis. At least mild bony neural foraminal narrowing on the left at C6-C7 due to uncovertebral hypertrophy. Ventral osteophytes at C5-C6 and C6-C7. Degenerative changes also present at the C1-C2 articulation. Upper chest: No consolidation within the imaged lung apices. No visible pneumothorax. IMPRESSION: CT head: 1.  No evidence of an acute intracranial abnormality. 2. Small fluid level within the left maxillary sinus at the imaged levels. Correlate for signs/symptoms of acute sinusitis. CT cervical spine: 1. No evidence of an acute cervical spine fracture. 2. Nonspecific reversal of expected cervical lordosis. 3. Dextrocurvature of the cervical spine. 4. Cervical spondylosis as described. Electronically Signed   By: Jackey Loge D.O.   On: 06/12/2023 12:19   CT Cervical Spine Wo Contrast Result Date: 06/12/2023 CLINICAL DATA:  Provided history: Mental status change, unknown cause. Fall. Neck trauma, intoxicated obtunded. EXAM: CT HEAD WITHOUT CONTRAST CT  CERVICAL SPINE WITHOUT CONTRAST TECHNIQUE: Multidetector CT imaging of the head and cervical spine was performed following the standard protocol without intravenous contrast. Multiplanar CT image reconstructions of the cervical spine were also generated. RADIATION DOSE REDUCTION: This exam was performed according to the departmental dose-optimization program which includes automated exposure control, adjustment of the mA and/or kV according to patient size and/or use of iterative reconstruction technique. COMPARISON:  Head CT 10/10/2021.  Cervical spine CT 10/10/2021. FINDINGS: CT HEAD FINDINGS Brain: There is no acute intracranial hemorrhage. No demarcated cortical infarct. No extra-axial fluid collection. No evidence of an intracranial mass. No midline shift. Vascular: No hyperdense vessel. Atherosclerotic calcifications. Skull: No calvarial fracture or aggressive osseous lesion. Sinuses/Orbits: No mass or acute finding within the imaged orbits. Small fluid level within the left maxillary sinus at the imaged levels. CT CERVICAL SPINE FINDINGS Alignment: Nonspecific reversal of the expected cervical lordosis. Dextrocurvature of the cervical spine. No significant spondylolisthesis. Skull base and vertebrae: The basion-dental and atlanto-dental intervals are maintained.No evidence of acute fracture to the cervical spine. Soft tissues and spinal canal: No prevertebral fluid or swelling. No visible canal hematoma. Disc levels: Cervical spondylosis with multilevel disc space narrowing, disc bulges/central disc protrusions, posterior disc osteophyte complexes, endplate spurring and uncovertebral hypertrophy. Disc space narrowing is greatest at C5-C6 and C6-C7 (moderate at these levels). No appreciable high-grade spinal canal stenosis. At least mild bony neural foraminal narrowing on the left at C6-C7 due to uncovertebral hypertrophy. Ventral osteophytes at C5-C6 and C6-C7. Degenerative changes also present at the C1-C2  articulation. Upper  chest: No consolidation within the imaged lung apices. No visible pneumothorax. IMPRESSION: CT head: 1.  No evidence of an acute intracranial abnormality. 2. Small fluid level within the left maxillary sinus at the imaged levels. Correlate for signs/symptoms of acute sinusitis. CT cervical spine: 1. No evidence of an acute cervical spine fracture. 2. Nonspecific reversal of expected cervical lordosis. 3. Dextrocurvature of the cervical spine. 4. Cervical spondylosis as described. Electronically Signed   By: Jackey Loge D.O.   On: 06/12/2023 12:19   DG Chest Portable 1 View Result Date: 06/12/2023 CLINICAL DATA:  fall EXAM: PORTABLE CHEST 1 VIEW COMPARISON:  09/30/2022. FINDINGS: Low lung volume. Bilateral lung fields are clear. Bilateral costophrenic angles are clear. Normal cardio-mediastinal silhouette. No acute osseous abnormalities. The soft tissues are within normal limits. IMPRESSION: No active disease. Electronically Signed   By: Jules Schick M.D.   On: 06/12/2023 11:22    EKG: Independently reviewed.  Sinus rhythm, no acute ST changes.  Prolonged QTc.  Assessment/Plan Principal Problem:   AMS (altered mental status) Active Problems:   Acute metabolic encephalopathy  (please populate well all problems here in Problem List. (For example, if patient is on BP meds at home and you resume or decide to hold them, it is a problem that needs to be her. Same for CAD, COPD, HLD and so on)  Acute metabolic encephalopathy - Suspect recurrent hypoglycemia with brittle glucose control.  Most recent A1c 6.4 however given her eating habit of 1 large carbohydrate containing meal a day, likely the hypoglycemia related to her eating habit. - Main problem appears to be binge eating habit which started after patient had gastric bypass several years ago, discussed with son regarding modification of her diet and will consult dietitian - Other DDx, polypharmacy also should be considered.  As  per son's request, we will consult psychiatry to help [place] to clean patient's psychiatry medication list as she has been taking several SSRI and sedation medication including Klonopin, Ambien and trazodone all at night. - Other etiology, UTI ruled out, pneumonia ruled out.  Monitor off antibiotics.  TSH  IIDM - SSI - Hold off metformin now until her mentation improves  Bipolar disorder - Continue current psychiatry regimen including Paxil, Seroquel, trazodone - At bedtime Minipress Trileptal and Klonopin as needed  Seizure disorder - No acute concern, continue Topamax  Nonobstructive CAD - No acute concern, continue Ranexa  Hx of cryptogenic stroke Hx of SVT -Stroke in 2019 and was diagnosed with SVT then, but denied any Hx of Afib/Flutter -Continue Eliquis   DVT prophylaxis: Eliquis Code Status: Full code Family Communication: Son over the phone Disposition Plan: Expect less than 2 midnight hospital stay Consults called: Psychiatry Admission status: Telemetry observation   Emeline General MD [facility] Pager [phone]  06/12/2023, 4:21 PM

## 2023-06-12 NOTE — ED Provider Notes (Signed)
 [facility] Provider Note    Event Date/Time   First MD Initiated Contact with Patient 06/12/23 1014     (approximate)   History   Altered Mental Status and Fall   HPI  Diana Quinn is a 58 y.o. female with medical history significant for COPD, bipolar/schizophrenia, diabetes, CAD, seizure disorder, history of gastric bypass, prior TIA, presents to the emergency department for altered mental status.  History is provided by EMS.  Stated that the patient lives with her son and he was calling for a fall.  When they arrived fall was yesterday and patient has been having worsening altered mental status from her baseline.  Patient is unable to give any further history.  Able to state her name.  Is unable to answer the year or the date.  Is uncertain of where she is.  Able to state a couple of allergies but otherwise unable to give any further history.  Does not recall the events of a fall.  On chart review recent outpatient visit and was started on 2 tizanidine for back pain.  Patient's son arrived and stated that she started having some confusion and altered mental status after getting back from primary care visit on Wednesday.  Worsening confusion but had some improvement yesterday.  Today was significantly more confused.  He was unable to find her tizanidine but thought that maybe there was a medication that was causing her altered mental status.  She is not at her baseline.  States that he has cameras to watch her in the house and stated that she had 2 falls last night and 1 fall where she was down for approximately 1 hour.     Physical Exam   Triage Vital Signs: ED Triage Vitals  Encounter Vitals Group     BP 06/12/23 1011 133/89     Systolic BP Percentile --      Diastolic BP Percentile --      Pulse Rate 06/12/23 1011 91     Resp 06/12/23 1011 15     Temp 06/12/23 1017 98.5 F (36.9 C)     Temp Source 06/12/23 1017 Oral     SpO2 06/12/23 1011 100 %      Weight 06/12/23 1023 206 lb 5.6 oz (93.6 kg)     Height 06/12/23 1023 5\' 3"  (1.6 m)     Head Circumference --      Peak Flow --      Pain Score 06/12/23 1018 0     Pain Loc --      Pain Education --      Exclude from Growth Chart --     Most recent vital signs: Vitals:   06/12/23 1230 06/12/23 1300  BP: (!) 127/91 121/89  Pulse: 89 80  Resp: 20 15  Temp:    SpO2: 100% 99%    Physical Exam Constitutional:      Appearance: She is well-developed.  HENT:     Head: Atraumatic.     Mouth/Throat:     Mouth: Mucous membranes are moist.  Eyes:     Extraocular Movements: Extraocular movements intact.     Conjunctiva/sclera: Conjunctivae normal.     Pupils: Pupils are equal, round, and reactive to light.  Cardiovascular:     Rate and Rhythm: Regular rhythm.  Pulmonary:     Effort: No respiratory distress.  Abdominal:     General: There is no distension.     Tenderness: There is no abdominal tenderness.  Musculoskeletal:        General: Normal range of motion.     Cervical back: Normal range of motion and neck supple.  Skin:    General: Skin is warm.  Neurological:     Mental Status: She is alert. She is disoriented.     Comments: Moving all extremities.  Able to hold bilateral upper extremities up to gravity.  Difficulty following commands.  No clonus to bilateral lower extremities.     IMPRESSION / MDM / ASSESSMENT AND PLAN / ED COURSE  I reviewed the triage vital signs and the nursing notes.  Differential diagnosis including polypharmacy, electrolyte abnormality, intracranial hemorrhage, CVA, postictal from a seizure, serotonin syndrome.  Low suspicion for NMS, not rigid and no fever.   EKG  I, Corena Herter, the attending physician, personally viewed and interpreted this ECG.  EKG showed normal sinus rhythm.  PR interval 205.  Narrow complex.  Prolonged QTc of 520.  No tachycardic or bradycardic dysrhythmias while on cardiac telemetry.  RADIOLOGY I  independently reviewed imaging, my interpretation of imaging: CT scan of the head -no findings of acute intracranial hemorrhage  CT scan cervical spine -no fracture  Chest x-ray -no signs of pneumonia or rib fractures.  LABS (all labs ordered are listed, but only abnormal results are displayed) Labs interpreted as -    Labs Reviewed  CBC - Abnormal; Notable for the following components:      Result Value   WBC 12.2 (*)    RBC 3.79 (*)    Platelets 401 (*)    All other components within normal limits  COMPREHENSIVE METABOLIC PANEL WITH GFR - Abnormal; Notable for the following components:   Potassium 3.3 (*)    Calcium 8.4 (*)    Alkaline Phosphatase 182 (*)    All other components within normal limits  ACETAMINOPHEN LEVEL - Abnormal; Notable for the following components:   Acetaminophen (Tylenol), Serum <10 (*)    All other components within normal limits  SALICYLATE LEVEL - Abnormal; Notable for the following components:   Salicylate Lvl <7.0 (*)    All other components within normal limits  URINALYSIS, W/ REFLEX TO CULTURE (INFECTION SUSPECTED) - Abnormal; Notable for the following components:   Color, Urine YELLOW (*)    APPearance CLEAR (*)    Bilirubin Urine SMALL (*)    All other components within normal limits  AMMONIA  ETHANOL  LACTIC ACID, PLASMA  TSH  MAGNESIUM  CK  URINE DRUG SCREEN, QUALITATIVE ([facility] ONLY)  TOPIRAMATE LEVEL  CBG MONITORING, ED     MDM  Plan for lab work and CT imaging.  Will attempt to get a hold of family member.  CT scan of the head without findings of acute intracranial hemorrhage.  Has a nonfocal neurologic exam and have a lower suspicion for acute CVA.  Does have leukocytosis but no obvious source of an infectious process.  Normal thyroid studies.  No signs of a coingestion.  Normal lactic acid.  No signs of rhabdomyolysis.  UA with no signs of a urinary tract infection.  Alcohol level is negative.  Most likely with polypharmacy as  the cause of her altered mental status.  Does have a prolonged QTc which is likely from multiple home medications.  Have lower suspicion for meningitis, no meningismus on exam and no fever.  Do not feel that a lumbar puncture is warranted at this time.  Most likely polypharmacy with new medications.  Consulted hospitalist for admission for altered  mental status.     PROCEDURES:  Critical Care performed: No  Procedures  Patient's presentation is most consistent with acute presentation with potential threat to life or bodily function.   MEDICATIONS ORDERED IN ED: Medications  potassium chloride 10 mEq in 100 mL IVPB (10 mEq Intravenous New Bag/Given 06/12/23 1326)  sodium chloride 0.9 % bolus 1,000 mL (1,000 mLs Intravenous New Bag/Given 06/12/23 1323)    FINAL CLINICAL IMPRESSION(S) / ED DIAGNOSES   Final diagnoses:  Altered mental status, unspecified altered mental status type  Fall, initial encounter     Rx / DC Orders   ED Discharge Orders     None        Note:  This document was prepared using Dragon voice recognition software and may include unintentional dictation errors.   Corena Herter, MD 06/12/23 1420

## 2023-06-12 NOTE — ED Notes (Signed)
 Pts blood sugar 73 mg/dl. Given orange juice and a snack.

## 2023-06-12 NOTE — ED Notes (Signed)
 Provider zhang at bedside. Pt bp 81/50.

## 2023-06-12 NOTE — ED Notes (Signed)
 Telepsych cart in room and provider speaking with patient.

## 2023-06-12 NOTE — ED Triage Notes (Signed)
 Pt arrived [facility] from home. C/o AMS per son.  Fall last night. Per EMS son states that pt is more altered than baseline. A & O x 2 currently.

## 2023-06-12 NOTE — ED Notes (Signed)
 Provider Jon Billings NP made aware of pts decrease in blood pressure 83/54 MAP 62. Received order to monitor.

## 2023-06-13 ENCOUNTER — Encounter: Payer: Self-pay | Admitting: Internal Medicine

## 2023-06-13 DIAGNOSIS — E11649 Type 2 diabetes mellitus with hypoglycemia without coma: Secondary | ICD-10-CM | POA: Diagnosis not present

## 2023-06-13 LAB — MAGNESIUM: Magnesium: 1.9 mg/dL (ref 1.7–2.4)

## 2023-06-13 LAB — CBG MONITORING, ED
Glucose-Capillary: 170 mg/dL — ABNORMAL HIGH (ref 70–99)
Glucose-Capillary: 113 mg/dL — ABNORMAL HIGH (ref 70–99)
Glucose-Capillary: 143 mg/dL — ABNORMAL HIGH (ref 70–99)

## 2023-06-13 LAB — BASIC METABOLIC PANEL WITH GFR
Anion gap: 5 (ref 5–15)
BUN: 14 mg/dL (ref 6–20)
CO2: 22 mmol/L (ref 22–32)
Calcium: 7.9 mg/dL — ABNORMAL LOW (ref 8.9–10.3)
Chloride: 109 mmol/L (ref 98–111)
Creatinine, Ser: 0.67 mg/dL (ref 0.44–1.00)
GFR, Estimated: >60 mL/min (ref >60)
Glucose, Bld: 88 mg/dL (ref 70–99)
Potassium: 4 mmol/L (ref 3.5–5.1)
Sodium: 136 mmol/L (ref 135–145)

## 2023-06-13 MED ORDER — SODIUM CHLORIDE 0.9 % IV BOLUS
1000.0000 mL | Freq: Once | INTRAVENOUS | Status: AC
  Administered 2023-06-13: 1000 mL via INTRAVENOUS

## 2023-06-13 MED ORDER — APIXABAN 2.5 MG PO TABS
2.5000 mg | ORAL_TABLET | Freq: Two times a day (BID) | ORAL | Status: DC
  Administered 2023-06-13: 2.5 mg via ORAL
  Filled 2023-06-13 (×2): qty 1

## 2023-06-13 NOTE — Discharge Summary (Signed)
 Physician Discharge Summary   Patient: Diana Quinn MRN: [id number] DOB: Feb 14, 1966  Admit date:     06/12/2023  Discharge date: 06/13/23  Discharge Physician: Loyce Dys   PCP: Miki Kins, FNP   Recommendations at discharge:    Discharge Diagnoses: Acute metabolic encephalopathy Suspect recurrent hypoglycemia with brittle glucose control.   IIDM Bipolar disorder Seizure disorder Nonobstructive CAD Hx of cryptogenic stroke Hx of SVT Hx of Afib/Flutter  Hospital Course: Kamayah Pillay is a 58 y.o. female with medical history significant of IIDM, HTN, anxiety/depression, nonobstructive CAD, bipolar disorder, seizure disorder, status post gastric bypass for obesity, brought in by family member for worsening of altered mentation. Patient is rather confused and provided limited information, most of history provided by son over the phone.  Family is concerned that the patient has been taking " too much psychiatric medications".  Patient and family reported that patient has had diabetes used to be diet controlled until recently, when patient traveled to [place] she had episode of glucose more than 400.  Patient does have a habit of binge eating, son reported that she usually only eats 1 meal a day but which contains huge amount of calorie and her sugar tends to run high immediately after as she wears a wearable Freeby glucometer with strips.  This week, she was started on metformin and patient appeared to be tolerated it very well denied any common side effects such as bloating or diarrhea or abdominal pain.  However 2 days ago, patient was found lethargic in the morning and son checked her sugar was 50.  Patient was given soda and some crackers and glucose improved to 70.  This morning, again was found the patient is lethargic however this time her glucose was 110s.  And son reporting patient has been talking repetitively to herself.  She was seen by psychiatry team who recommended  continuation of current medications.  I spoke with the son who brought all patient's medication to bedside and we went over each medication one by one.  Patient is currently back to his baseline and doing much better and therefore being discharged home today and will follow-up with PCP and psychiatry   Consultants: Psychiatry Procedures performed: None Disposition: Home Diet recommendation:  Cardiac and Carb modified diet DISCHARGE MEDICATION: Allergies as of 06/13/2023       Reactions   Depakote [divalproex Sodium] Shortness Of Breath   Pregabalin Other (See Comments), Rash, Shortness Of Breath   Other Reaction: falls; decreased coordination Pt ts that she passes out when she takes this med.  Other Reaction: falls; decreased coordination    Pt ts that she passes out when she takes this med. Other Reaction: falls; decreased coordination, , Pt ts that she passes out when she takes this med.   Valproic Acid Other (See Comments), Shortness Of Breath   Other Reaction: Other reaction SOB   Levetiracetam Itching, Rash        Medication List     STOP taking these medications    metoCLOPramide 10 MG tablet Commonly known as: REGLAN   prazosin 1 MG capsule Commonly known as: MINIPRESS   propranolol 20 MG tablet Commonly known as: INDERAL       TAKE these medications    acetaminophen 325 MG tablet Commonly known as: TYLENOL Take 2 tablets (650 mg total) by mouth every 6 (six) hours as needed for mild pain or moderate pain.   atorvastatin 40 MG tablet Commonly known as: LIPITOR Take 1 tablet (  40 mg total) by mouth daily.   Belsomra 20 MG Tabs Generic drug: Suvorexant Take 1 tablet by mouth at bedtime as needed.   Caplyta 42 MG capsule Generic drug: lumateperone tosylate Take 1 capsule (42 mg total) by mouth daily.   cetirizine 10 MG tablet Commonly known as: ZyrTEC Allergy Take 1 tablet (10 mg total) by mouth daily.   cholecalciferol 25 MCG (1000 UNIT)  tablet Commonly known as: VITAMIN D3 Take 1 tablet (1,000 Units total) by mouth daily.   clonazePAM 1 MG tablet Commonly known as: KLONOPIN Take 1 mg by mouth at bedtime as needed.   cyanocobalamin 1000 MCG/ML injection Commonly known as: VITAMIN B12 Inject 1 mL (1,000 mcg total) into the muscle every 30 (thirty) days.   Eliquis 2.5 MG Tabs tablet Generic drug: apixaban Take 1 tablet (2.5 mg total) by mouth 2 (two) times daily.   ergocalciferol 1.25 MG (50000 UT) capsule Commonly known as: VITAMIN D2 Take 1 capsule (50,000 Units total) by mouth once a week.   famotidine 20 MG tablet Commonly known as: PEPCID Take 1 tablet (20 mg total) by mouth 2 (two) times daily.   FreeStyle Libre 3 Sensor Misc Place 1 sensor on the skin every 14 days. Use to check glucose continuously   glucose blood test strip Use as instructed   Gvoke HypoPen 2-Pack 1 MG/0.2ML Soaj Generic drug: Glucagon Inject 1 mg into the skin as needed (hypoglycemia).   levothyroxine 112 MCG tablet Commonly known as: SYNTHROID Take 1 tablet (112 mcg total) by mouth daily before breakfast.   metFORMIN 500 MG tablet Commonly known as: GLUCOPHAGE Take 500 mg by mouth 2 (two) times daily.   multivitamin with minerals Tabs tablet Take 1 tablet by mouth daily.   Oxcarbazepine 300 MG tablet Commonly known as: TRILEPTAL Take 2 tablets (600 mg total) by mouth 2 (two) times daily.   pantoprazole 40 MG tablet Commonly known as: PROTONIX Take 1 tablet (40 mg total) by mouth daily.   PARoxetine 40 MG tablet Commonly known as: PAXIL Take 1 tablet (40 mg total) by mouth daily.   QUEtiapine 100 MG tablet Commonly known as: SEROQUEL Take 3 tablets (300 mg total) by mouth at bedtime.   ranolazine 1000 MG SR tablet Commonly known as: RANEXA Take 1 tablet (1,000 mg total) by mouth 2 (two) times daily.   rosuvastatin 20 MG tablet Commonly known as: CRESTOR Take 20 mg by mouth at bedtime.   sucralfate 1 g  tablet Commonly known as: CARAFATE Take 1 tablet (1 g total) by mouth 4 (four) times daily.   tiZANidine 4 MG tablet Commonly known as: ZANAFLEX Take 2 tablets (8 mg total) by mouth 3 (three) times daily.   Topiramate ER 200 MG Cp24 Commonly known as: TROKENDI XR Take 1 capsule (200 mg total) by mouth daily.   traZODone 150 MG tablet Commonly known as: DESYREL Take 2 tablets (300 mg total) by mouth at bedtime.   Ubrelvy 100 MG Tabs Generic drug: Ubrogepant Take 1 tablet (100 mg total) by mouth as needed.        Discharge Exam: Filed Weights   06/12/23 1023  Weight: 93.6 kg   General: Middle-age female sitting in bed in no distress Neck: normal, supple, no masses, no thyromegaly Respiratory: clear to auscultation bilaterally, no wheezing, no crackles. Normal respiratory effort. No accessory muscle use.  Cardiovascular: Regular rate and rhythm, no murmurs / rubs / gallops. No extremity edema. 2+ pedal pulses. No carotid bruits.  Abdomen: no tenderness, no masses palpated. No hepatosplenomegaly. Bowel sounds positive.  Musculoskeletal: no clubbing / cyanosis. No joint deformity upper and lower extremities.  Skin: no rashes, lesions, ulcers. No induration Neurologic: Alert and oriented x 3 moving all extremities  Condition at discharge: good  The results of significant diagnostics from this hospitalization (including imaging, microbiology, ancillary and laboratory) are listed below for reference.   Imaging Studies: CT Head Wo Contrast Result Date: 06/12/2023 CLINICAL DATA:  Provided history: Mental status change, unknown cause. Fall. Neck trauma, intoxicated obtunded. EXAM: CT HEAD WITHOUT CONTRAST CT CERVICAL SPINE WITHOUT CONTRAST TECHNIQUE: Multidetector CT imaging of the head and cervical spine was performed following the standard protocol without intravenous contrast. Multiplanar CT image reconstructions of the cervical spine were also generated. RADIATION DOSE REDUCTION:  This exam was performed according to the departmental dose-optimization program which includes automated exposure control, adjustment of the mA and/or kV according to patient size and/or use of iterative reconstruction technique. COMPARISON:  Head CT 10/10/2021.  Cervical spine CT 10/10/2021. FINDINGS: CT HEAD FINDINGS Brain: There is no acute intracranial hemorrhage. No demarcated cortical infarct. No extra-axial fluid collection. No evidence of an intracranial mass. No midline shift. Vascular: No hyperdense vessel. Atherosclerotic calcifications. Skull: No calvarial fracture or aggressive osseous lesion. Sinuses/Orbits: No mass or acute finding within the imaged orbits. Small fluid level within the left maxillary sinus at the imaged levels. CT CERVICAL SPINE FINDINGS Alignment: Nonspecific reversal of the expected cervical lordosis. Dextrocurvature of the cervical spine. No significant spondylolisthesis. Skull base and vertebrae: The basion-dental and atlanto-dental intervals are maintained.No evidence of acute fracture to the cervical spine. Soft tissues and spinal canal: No prevertebral fluid or swelling. No visible canal hematoma. Disc levels: Cervical spondylosis with multilevel disc space narrowing, disc bulges/central disc protrusions, posterior disc osteophyte complexes, endplate spurring and uncovertebral hypertrophy. Disc space narrowing is greatest at C5-C6 and C6-C7 (moderate at these levels). No appreciable high-grade spinal canal stenosis. At least mild bony neural foraminal narrowing on the left at C6-C7 due to uncovertebral hypertrophy. Ventral osteophytes at C5-C6 and C6-C7. Degenerative changes also present at the C1-C2 articulation. Upper chest: No consolidation within the imaged lung apices. No visible pneumothorax. IMPRESSION: CT head: 1.  No evidence of an acute intracranial abnormality. 2. Small fluid level within the left maxillary sinus at the imaged levels. Correlate for signs/symptoms of  acute sinusitis. CT cervical spine: 1. No evidence of an acute cervical spine fracture. 2. Nonspecific reversal of expected cervical lordosis. 3. Dextrocurvature of the cervical spine. 4. Cervical spondylosis as described. Electronically Signed   By: Jackey Loge D.O.   On: 06/12/2023 12:19   CT Cervical Spine Wo Contrast Result Date: 06/12/2023 CLINICAL DATA:  Provided history: Mental status change, unknown cause. Fall. Neck trauma, intoxicated obtunded. EXAM: CT HEAD WITHOUT CONTRAST CT CERVICAL SPINE WITHOUT CONTRAST TECHNIQUE: Multidetector CT imaging of the head and cervical spine was performed following the standard protocol without intravenous contrast. Multiplanar CT image reconstructions of the cervical spine were also generated. RADIATION DOSE REDUCTION: This exam was performed according to the departmental dose-optimization program which includes automated exposure control, adjustment of the mA and/or kV according to patient size and/or use of iterative reconstruction technique. COMPARISON:  Head CT 10/10/2021.  Cervical spine CT 10/10/2021. FINDINGS: CT HEAD FINDINGS Brain: There is no acute intracranial hemorrhage. No demarcated cortical infarct. No extra-axial fluid collection. No evidence of an intracranial mass. No midline shift. Vascular: No hyperdense vessel. Atherosclerotic calcifications. Skull: No calvarial fracture  or aggressive osseous lesion. Sinuses/Orbits: No mass or acute finding within the imaged orbits. Small fluid level within the left maxillary sinus at the imaged levels. CT CERVICAL SPINE FINDINGS Alignment: Nonspecific reversal of the expected cervical lordosis. Dextrocurvature of the cervical spine. No significant spondylolisthesis. Skull base and vertebrae: The basion-dental and atlanto-dental intervals are maintained.No evidence of acute fracture to the cervical spine. Soft tissues and spinal canal: No prevertebral fluid or swelling. No visible canal hematoma. Disc levels:  Cervical spondylosis with multilevel disc space narrowing, disc bulges/central disc protrusions, posterior disc osteophyte complexes, endplate spurring and uncovertebral hypertrophy. Disc space narrowing is greatest at C5-C6 and C6-C7 (moderate at these levels). No appreciable high-grade spinal canal stenosis. At least mild bony neural foraminal narrowing on the left at C6-C7 due to uncovertebral hypertrophy. Ventral osteophytes at C5-C6 and C6-C7. Degenerative changes also present at the C1-C2 articulation. Upper chest: No consolidation within the imaged lung apices. No visible pneumothorax. IMPRESSION: CT head: 1.  No evidence of an acute intracranial abnormality. 2. Small fluid level within the left maxillary sinus at the imaged levels. Correlate for signs/symptoms of acute sinusitis. CT cervical spine: 1. No evidence of an acute cervical spine fracture. 2. Nonspecific reversal of expected cervical lordosis. 3. Dextrocurvature of the cervical spine. 4. Cervical spondylosis as described. Electronically Signed   By: Jackey Loge D.O.   On: 06/12/2023 12:19   DG Chest Portable 1 View Result Date: 06/12/2023 CLINICAL DATA:  fall EXAM: PORTABLE CHEST 1 VIEW COMPARISON:  09/30/2022. FINDINGS: Low lung volume. Bilateral lung fields are clear. Bilateral costophrenic angles are clear. Normal cardio-mediastinal silhouette. No acute osseous abnormalities. The soft tissues are within normal limits. IMPRESSION: No active disease. Electronically Signed   By: Jules Schick M.D.   On: 06/12/2023 11:22    Microbiology: Results for orders placed or performed during the hospital encounter of 09/09/22  Urine Culture     Status: Abnormal   Collection Time: 09/15/22  3:39 AM   Specimen: Urine, Clean Catch  Result Value Ref Range Status   Specimen Description   Final    URINE, CLEAN CATCH Performed at [facility], [address], [place], [place] [address]    Special Requests   Final    NONE Performed at  [facility], [address] [place], [place] [address]    Culture >=100,000 COLONIES/mL ESCHERICHIA COLI (A)  Final   Report Status 09/18/2022 FINAL  Final   Organism ID, Bacteria ESCHERICHIA COLI (A)  Final      Susceptibility   Escherichia coli - MIC*    AMPICILLIN >=32 RESISTANT Resistant     CEFAZOLIN >=64 RESISTANT Resistant     CEFEPIME <=0.12 SENSITIVE Sensitive     CEFTRIAXONE 8 RESISTANT Resistant     CIPROFLOXACIN <=0.25 SENSITIVE Sensitive     GENTAMICIN <=1 SENSITIVE Sensitive     IMIPENEM <=0.25 SENSITIVE Sensitive     NITROFURANTOIN <=16 SENSITIVE Sensitive     TRIMETH/SULFA <=20 SENSITIVE Sensitive     AMPICILLIN/SULBACTAM >=32 RESISTANT Resistant     PIP/TAZO >=128 RESISTANT Resistant     * >=100,000 COLONIES/mL ESCHERICHIA COLI    Labs: CBC: Recent Labs  Lab 06/12/23 1029  WBC 12.2*  HGB 12.1  HCT 37.0  MCV 97.6  PLT 401*   Basic Metabolic Panel: Recent Labs  Lab 06/12/23 1029 06/13/23 0413  NA 138 136  K 3.3* 4.0  CL 109 109  CO2 22 22  GLUCOSE 75 88  BUN 18  14  CREATININE 0.88 0.67  CALCIUM 8.4* 7.9*  MG 2.1 1.9   Liver Function Tests: Recent Labs  Lab 06/12/23 1029  AST 23  ALT 18  ALKPHOS 182*  BILITOT 0.3  PROT 6.7  ALBUMIN 3.6   CBG: Recent Labs  Lab 06/12/23 1652 06/12/23 2207 06/12/23 2328 06/13/23 0748 06/13/23 1204  GLUCAP 124* 73 170* 113* 143*    Discharge time spent:  37 minutes.  Signed: Loyce Dys, MD [facility] 06/13/2023

## 2023-06-13 NOTE — ED Notes (Signed)
 Pt ambulated to bathroom without difficulty. Pt gait steady. Pt declined walker. Pt states she only uses a walker when she walks long distances

## 2023-06-13 NOTE — Evaluation (Signed)
 Physical Therapy Evaluation Patient Details Name: Diana Quinn MRN: [id number] DOB: [birth year] Today's Date: 06/13/2023  History of Present Illness  Pt is a 58 y.o. female presenting to hospital 06/12/23 with c/o AMS and fall.  Pt admitted with AMS, acute metabolic encephalopathy.  PMH includes COPD, bipolar/schizophrenia, DM, CAD, seizure disorder, h/o gastric bypass, h/o TIA, B ankle fx's 09/2022, orthostatic hypotension.  Clinical Impression  Prior to recent medical concerns, pt reports being independent with ambulation; lives with her son.  Pt's son reports he feels like her recent falls were d/t impaired cognition (which has significantly improved now).  Pt A&Ox4.  Currently pt is modified independent with bed mobility; SBA with transfers; and SBA with ambulation in hallway.  No loss of balance noted with dynamic standing activities or functional mobility during session.  Pt would currently benefit from skilled PT to address noted impairments and functional limitations (see below for any additional details).  Upon hospital discharge, no further PT needs anticipated.  MD updated on pt's status.    If plan is discharge home, recommend the following:     Can travel by private vehicle    Yes    Equipment Recommendations None recommended by PT  Recommendations for Other Services       Functional Status Assessment Patient has had a recent decline in their functional status and demonstrates the ability to make significant improvements in function in a reasonable and predictable amount of time.     Precautions / Restrictions Precautions Precautions: Fall Recall of Precautions/Restrictions: Intact Restrictions Weight Bearing Restrictions Per Provider Order: No      Mobility  Bed Mobility Overal bed mobility: Modified Independent             General bed mobility comments: Semi-supine to/from sitting without any noted difficulties    Transfers Overall transfer level: Needs  assistance Equipment used: None Transfers: Sit to/from Stand Sit to Stand: Supervision           General transfer comment: steady transfer from bed    Ambulation/Gait Ambulation/Gait assistance: Supervision Gait Distance (Feet): 120 Feet Assistive device: None Gait Pattern/deviations: Step-through pattern Gait velocity: mildly decreased     General Gait Details: steady ambulation  Stairs            Wheelchair Mobility     Tilt Bed    Modified Rankin (Stroke Patients Only)       Balance Overall balance assessment: Needs assistance Sitting-balance support: No upper extremity supported, Feet supported Sitting balance-Leahy Scale: Good Sitting balance - Comments: steady reaching within BOS   Standing balance support: No upper extremity supported, During functional activity Standing balance-Leahy Scale: Good Standing balance comment: no loss of balance with ambulation (including with looking R/L/up/down, increasing/decreasing speed, and turning 180 degrees and stopping)                             Pertinent Vitals/Pain Pain Assessment Pain Assessment: No/denies pain HR and SpO2 sats on room air stable during session.    Home Living Family/patient expects to be discharged to:: Private residence Living Arrangements: Children (Pt's son) Available Help at Discharge: Family;Available PRN/intermittently (Pt's son's fiance is around) Type of Home: House Home Access: Stairs to enter Entrance Stairs-Rails: Right;Left Entrance Stairs-Number of Steps: 6   Home Layout: One level Home Equipment: Agricultural consultant (2 wheels);Rollator (4 wheels)      Prior Function Prior Level of Function : History of Falls (last six months);Independent/Modified  Independent             Mobility Comments: Independent with ambulation; uses rollator for community distances ADLs Comments: Modified independent with ADL's; assist for IADL's     Extremity/Trunk Assessment    Upper Extremity Assessment Upper Extremity Assessment: Overall WFL for tasks assessed    Lower Extremity Assessment Lower Extremity Assessment: Generalized weakness    Cervical / Trunk Assessment Cervical / Trunk Assessment: Normal  Communication   Communication Communication: No apparent difficulties    Cognition Arousal: Alert Behavior During Therapy: WFL for tasks assessed/performed   PT - Cognitive impairments: No apparent impairments                         Following commands: Intact       Cueing Cueing Techniques: Verbal cues     General Comments General comments (skin integrity, edema, etc.): BP 97/76 pre-ambulation; BP 116/70 post ambulation.  Nurse updated.  Nursing cleared pt for participation in physical therapy.  Pt agreeable to PT session.  Pt's son present during session.    Exercises     Assessment/Plan    PT Assessment Patient needs continued PT services  PT Problem List Decreased strength       PT Treatment Interventions DME instruction;Gait training;Stair training;Functional mobility training;Therapeutic activities;Therapeutic exercise;Patient/family education    PT Goals (Current goals can be found in the Care Plan section)  Acute Rehab PT Goals Patient Stated Goal: to improve overall strength PT Goal Formulation: With patient Time For Goal Achievement: 06/27/23 Potential to Achieve Goals: Good    Frequency Min 1X/week     Co-evaluation               AM-PAC PT "6 Clicks" Mobility  Outcome Measure Help needed turning from your back to your side while in a flat bed without using bedrails?: None Help needed moving from lying on your back to sitting on the side of a flat bed without using bedrails?: None Help needed moving to and from a bed to a chair (including a wheelchair)?: A Little Help needed standing up from a chair using your arms (e.g., wheelchair or bedside chair)?: A Little Help needed to walk in hospital room?:  A Little Help needed climbing 3-5 steps with a railing? : A Little 6 Click Score: 20    End of Session Equipment Utilized During Treatment: Gait belt Activity Tolerance: Patient tolerated treatment well Patient left: in bed;with call bell/phone within reach;with bed alarm set;with family/visitor present Nurse Communication: Mobility status PT Visit Diagnosis: Muscle weakness (generalized) (M62.81)    Time: 5784-6962 PT Time Calculation (min) (ACUTE ONLY): 12 min   Charges:   PT Evaluation $PT Eval Low Complexity: 1 Low   PT General Charges $$ ACUTE PT VISIT: 1 Visit        Hendricks Limes, PT 06/13/23, 4:45 PM

## 2023-06-13 NOTE — Evaluation (Signed)
 Occupational Therapy Evaluation Patient Details Name: Diana Quinn MRN: [id number] DOB: [birth year] Today's Date: 06/13/2023   History of Present Illness   Pt is a 58 year old female presenting with AMS     PMH significant for IDM, HTN, anxiety/depression, nonobstructive CAD, bipolar disorder, seizure disorder, status post gastric bypass for obesity,     Clinical Impressions Chart reviewed, pt greeted in bed, alert and oriented x4, agreeable to OT evaluation. PTA pt reports generally MOD I for ADL, assist PRN for IADL and amb with rollator community distances but no AD use in house. Pt reports she feels close to baseline mobility/ ADL status, reports times where she feels "fuzzy" and then falls (had a fall Thursday). Orthostatics taken and recorded In flow sheet, no significant changes to vitals with position changes on this date. Dicussed use of rollator and vitals monitoring at home when she feels symptomatic. OT will follow acutely to facilitate optimal ADL performance. Pt is left as recieved, all needs met.      If plan is discharge home, recommend the following:   Assist for transportation;Assistance with cooking/housework     Functional Status Assessment   Patient has had a recent decline in their functional status and demonstrates the ability to make significant improvements in function in a reasonable and predictable amount of time.     Equipment Recommendations   None recommended by OT     Recommendations for Other Services         Precautions/Restrictions   Precautions Precautions: Fall Recall of Precautions/Restrictions: Intact Restrictions Weight Bearing Restrictions Per Provider Order: No     Mobility Bed Mobility Overal bed mobility: Modified Independent                  Transfers Overall transfer level: Needs assistance   Transfers: Sit to/from Stand Sit to Stand: Supervision                  Balance Overall balance assessment:  Needs assistance Sitting-balance support: Feet supported Sitting balance-Leahy Scale: Good     Standing balance support: No upper extremity supported Standing balance-Leahy Scale: Good                             ADL either performed or assessed with clinical judgement   ADL Overall ADL's : Needs assistance/impaired Eating/Feeding: Set up;Sitting   Grooming: Set up               Lower Body Dressing: Supervision/safety;Sitting/lateral leans Lower Body Dressing Details (indicate cue type and reason): donn/doff socks Toilet Transfer: Supervision/safety;Ambulation Toilet Transfer Details (indicate cue type and reason): simulated         Functional mobility during ADLs: Supervision/safety (approx 15' in room with no AD)       Vision Patient Visual Report: No change from baseline       Perception         Praxis         Pertinent Vitals/Pain Pain Assessment Pain Assessment: No/denies pain     Extremity/Trunk Assessment Upper Extremity Assessment Upper Extremity Assessment: Overall WFL for tasks assessed   Lower Extremity Assessment Lower Extremity Assessment: Generalized weakness       Communication Communication Communication: No apparent difficulties   Cognition Arousal: Alert Behavior During Therapy: WFL for tasks assessed/performed Cognition: No apparent impairments             OT - Cognition Comments: will continue to assess  higher level executive functioning                 Following commands: Intact       Cueing  General Comments   Cueing Techniques: Verbal cues  orthostatic vitals recorded, see flow sheet   Exercises Other Exercises Other Exercises: edu pt re: role of OT, role of rehab, discharge recommendations, monitoring BP and modifications for ADLs with DME   Shoulder Instructions      Home Living Family/patient expects to be discharged to:: Private residence Living Arrangements: Children Available  Help at Discharge: Family;Available PRN/intermittently (pt sons fiance is around) Type of Home: House Home Access: Stairs to enter [employer] of Steps: 7 steps at son's house Entrance Stairs-Rails: Right;Left Home Layout: One level     Bathroom Shower/Tub: Chief Strategy Officer: Standard Bathroom Accessibility: Yes   Home Equipment: Agricultural consultant (2 wheels);Rollator (4 wheels)          Prior Functioning/Environment Prior Level of Function : History of Falls (last six months);Independent/Modified Independent             Mobility Comments: amb with no AD, rollator for community distances ADLs Comments: MOD I ADLs, assist for IADLs    OT Problem List: Decreased activity tolerance   OT Treatment/Interventions: Self-care/ADL training;Therapeutic exercise;Patient/family education;DME and/or AE instruction      OT Goals(Current goals can be found in the care plan section)   Acute Rehab OT Goals Patient Stated Goal: figure out meds OT Goal Formulation: With patient Time For Goal Achievement: 06/27/23 Potential to Achieve Goals: Good ADL Goals Pt Will Perform Lower Body Dressing: with modified independence;sitting/lateral leans Pt Will Transfer to Toilet: with modified independence;ambulating Pt Will Perform Toileting - Clothing Manipulation and hygiene: with modified independence;sitting/lateral leans;sit to/from stand   OT Frequency:  Min 1X/week    Co-evaluation              AM-PAC OT "6 Clicks" Daily Activity     Outcome Measure Help from another person eating meals?: None Help from another person taking care of personal grooming?: None Help from another person toileting, which includes using toliet, bedpan, or urinal?: None Help from another person bathing (including washing, rinsing, drying)?: A Little Help from another person to put on and taking off regular upper body clothing?: None Help from another person to put on and taking  off regular lower body clothing?: None 6 Click Score: 23   End of Session Nurse Communication: Mobility status  Activity Tolerance: Patient tolerated treatment well Patient left: in bed;with call bell/phone within reach  OT Visit Diagnosis: Other abnormalities of gait and mobility (R26.89);Muscle weakness (generalized) (M62.81)                Time: 1140-1201 OT Time Calculation (min): 21 min Charges:  OT General Charges $OT Visit: 1 Visit OT Evaluation $OT Eval Low Complexity: 1 Low  Oleta Mouse, OTD OTR/L  06/13/23, 2:02 PM

## 2023-06-14 ENCOUNTER — Encounter: Payer: Self-pay | Admitting: Family

## 2023-06-14 NOTE — Assessment & Plan Note (Signed)
 Sending the tizanidine refill for patient.  She will let me know if this doesn't continue to improve.

## 2023-06-15 LAB — TOPIRAMATE LEVEL: Topiramate Lvl: 4.6 ug/mL (ref 2.0–25.0)

## 2023-06-20 ENCOUNTER — Other Ambulatory Visit: Payer: Self-pay | Admitting: Family

## 2023-06-22 ENCOUNTER — Other Ambulatory Visit (HOSPITAL_COMMUNITY): Payer: Self-pay

## 2023-06-22 ENCOUNTER — Other Ambulatory Visit: Payer: Self-pay

## 2023-06-22 MED ORDER — APIXABAN 2.5 MG PO TABS
2.5000 mg | ORAL_TABLET | Freq: Two times a day (BID) | ORAL | 0 refills | Status: DC
  Filled 2023-06-22 – 2023-07-04 (×2): qty 60, 30d supply, fill #0

## 2023-06-22 MED ORDER — PANTOPRAZOLE SODIUM 40 MG PO TBEC
40.0000 mg | DELAYED_RELEASE_TABLET | Freq: Every day | ORAL | 0 refills | Status: DC
  Filled 2023-06-22 – 2023-07-04 (×2): qty 90, 90d supply, fill #0

## 2023-06-22 MED ORDER — VITAMIN D3 25 MCG PO TABS
1000.0000 [IU] | ORAL_TABLET | Freq: Every day | ORAL | 0 refills | Status: AC
  Filled 2023-06-22 – 2023-07-04 (×2): qty 30, 30d supply, fill #0

## 2023-06-23 ENCOUNTER — Other Ambulatory Visit (HOSPITAL_COMMUNITY): Payer: Self-pay

## 2023-06-23 ENCOUNTER — Other Ambulatory Visit: Payer: Self-pay

## 2023-06-26 ENCOUNTER — Other Ambulatory Visit: Payer: Self-pay

## 2023-07-04 ENCOUNTER — Other Ambulatory Visit: Payer: Self-pay | Admitting: Family

## 2023-07-05 ENCOUNTER — Other Ambulatory Visit: Payer: Self-pay

## 2023-07-05 ENCOUNTER — Encounter: Payer: Self-pay | Admitting: Oncology

## 2023-07-06 ENCOUNTER — Other Ambulatory Visit (HOSPITAL_COMMUNITY): Payer: Self-pay

## 2023-07-06 MED ORDER — TRAZODONE HCL 150 MG PO TABS
300.0000 mg | ORAL_TABLET | Freq: Every day | ORAL | 0 refills | Status: DC
  Filled 2023-07-06: qty 60, 30d supply, fill #0

## 2023-07-06 MED ORDER — QUETIAPINE FUMARATE 100 MG PO TABS
300.0000 mg | ORAL_TABLET | Freq: Every day | ORAL | 0 refills | Status: DC
  Filled 2023-07-06: qty 90, 30d supply, fill #0

## 2023-07-07 ENCOUNTER — Encounter: Payer: Self-pay | Admitting: Pharmacist

## 2023-07-07 ENCOUNTER — Other Ambulatory Visit: Payer: Self-pay

## 2023-07-10 ENCOUNTER — Other Ambulatory Visit: Payer: Self-pay

## 2023-07-19 ENCOUNTER — Other Ambulatory Visit: Payer: Self-pay | Admitting: Family

## 2023-07-20 ENCOUNTER — Other Ambulatory Visit: Payer: Self-pay

## 2023-07-20 MED ORDER — RANOLAZINE ER 1000 MG PO TB12: 1000.0000 mg | ORAL_TABLET | Freq: Two times a day (BID) | ORAL | 1 refills | Status: DC

## 2023-08-04 ENCOUNTER — Other Ambulatory Visit: Payer: Self-pay | Admitting: Family

## 2023-08-04 DIAGNOSIS — M7918 Myalgia, other site: Secondary | ICD-10-CM

## 2023-08-04 DIAGNOSIS — G8929 Other chronic pain: Secondary | ICD-10-CM

## 2023-09-02 ENCOUNTER — Encounter: Payer: Self-pay | Admitting: Oncology

## 2023-09-09 ENCOUNTER — Ambulatory Visit: Payer: MEDICAID | Admitting: Family

## 2023-09-18 ENCOUNTER — Ambulatory Visit: Admitting: Family

## 2023-09-28 ENCOUNTER — Other Ambulatory Visit: Payer: Self-pay | Admitting: Family

## 2023-09-30 ENCOUNTER — Ambulatory Visit: Admitting: Family

## 2023-09-30 ENCOUNTER — Encounter: Payer: Self-pay | Admitting: Family

## 2023-09-30 VITALS — Ht 63.0 in | Wt 202.8 lb

## 2023-09-30 DIAGNOSIS — M533 Sacrococcygeal disorders, not elsewhere classified: Secondary | ICD-10-CM

## 2023-09-30 DIAGNOSIS — E1165 Type 2 diabetes mellitus with hyperglycemia: Secondary | ICD-10-CM | POA: Diagnosis not present

## 2023-09-30 DIAGNOSIS — H538 Other visual disturbances: Secondary | ICD-10-CM

## 2023-09-30 DIAGNOSIS — R0781 Pleurodynia: Secondary | ICD-10-CM | POA: Diagnosis not present

## 2023-09-30 DIAGNOSIS — R55 Syncope and collapse: Secondary | ICD-10-CM | POA: Diagnosis not present

## 2023-09-30 LAB — GLUCOSE, POCT (MANUAL RESULT ENTRY): POC Glucose: 122 mg/dL — AB (ref 70–99)

## 2023-09-30 NOTE — Progress Notes (Signed)
 Established Patient Office Visit  Subjective:  Patient ID: Diana Quinn, female    DOB: 02-06-1966  Age: 58 y.o. MRN: [id number]  Chief Complaint  Patient presents with   Acute Visit    Patient states she's having blurry vision and keeps falling    Started in April.  Having blurry vision, sudden onset, was trying to get back to her Living room, and had an episode of presyncope.  Son called 911, they sent her to hospital, she spent 2 days there, she had multiple tests, found no reason for it.  In between the trips, endocrine put her on metformin, but she says that caused her lots of issues.  She waited 3 weeks for a call back from her doctor's office, says that they told her to stop the metformin and manually take the blood sugars for 2 weeks.   Vision issues have gotten progressively worse overall, but it was doing some better for a while, but on Friday she got up to get to the bathroom, and she had sudden onset of blurry vision, almost had her legs collapse under her, and then got nauseated and was dry heaving.     No other concerns at this time.   Past Medical History:  Diagnosis Date   Abdominal pain, acute, right upper quadrant 12/12/2014   Abnormal weight loss 05/10/2014   Acute metabolic encephalopathy 09/23/2022   Allergic reaction 09/17/2016   AMS (altered mental status) 06/12/2023   Anemia    Ankle fracture 09/10/2022   Appetite loss 01/14/2015   Bilateral ankle fractures 09/25/2022   CAD (coronary artery disease) 08/10/2020   Closed fracture of left distal tibia 09/10/2022   Closed torus fracture of upper end of right fibula 04/13/2019   E. coli UTI 09/18/2022   EKG, abnormal 02/27/2013   Elevated lithium  level 05/10/2014   Gastric bypass status for obesity    H/O unstable angina    History of seizure 09/24/2022   Hx SBO 09/30/2014   Hypoglycemia    Hypokalemia 10/03/2022   Hyponatremia 09/17/2022   MI (myocardial infarction) [facility])    Migraine  headache 03/24/2011   Note: Unchanged - sees [facility] neurology, Dr. German  Note: Unchanged - sees [facility] neurolog[facility], Dr. German Deal of this note might be different from the original.  Note: Unchanged - sees [facility] neurology, Dr. German     Occipital headache (Fourth Area of Pain) (Bilateral) (L>R) 03/31/2018   Orthostatic hypotension    Osteoporosis    PEG tube malfunction (HCC) 01/13/2015   Poisoning by unspecified drugs, medicaments and biological substances, accidental (unintentional), initial encounter 02/27/2013   Polysubstance abuse (HCC) 11/30/2016   SBO (small bowel obstruction) (HCC) 09/23/2014   Sciatic nerve disease    Seizures (HCC)    Skin infection at gastrostomy tube site (HCC) 12/29/2014   Suicidal behavior 02/27/2013   Thyroid  disease    TIA (transient ischemic attack) 04/22/2017    Past Surgical History:  Procedure Laterality Date   ABDOMINAL ADHESION SURGERY     ABDOMINAL HYSTERECTOMY     CHOLECYSTECTOMY     DILATION AND CURETTAGE OF UTERUS     ESOPHAGOGASTRODUODENOSCOPY     GASTRIC BYPASS     GASTRIC BYPASS OPEN     revision   GASTROSTOMY W/ FEEDING TUBE     HERNIA REPAIR     OOPHORECTOMY     SPLENECTOMY     TONSILLECTOMY      Social History   Socioeconomic History  Marital status: Widowed    Spouse name: Not on file   Number of children: Not on file   Years of education: Not on file   Highest education level: Not on file  Occupational History   Not on file  Tobacco Use   Smoking status: Never   Smokeless tobacco: Never  Vaping Use   Vaping status: Never Used  Substance and Sexual Activity   Alcohol use: Not Currently   Drug use: Never   Sexual activity: Not on file  Other Topics Concern   Not on file  Social History Narrative   Not on file   Social Drivers of Health   Financial Resource Strain: Not on file  Food Insecurity: No Food Insecurity (06/13/2023)   Hunger Vital Sign    Worried About Running Out of Food in the Last Year:  Never true    Ran Out of Food in the Last Year: Never true  Transportation Needs: No Transportation Needs (06/13/2023)   PRAPARE - Administrator, Civil Service (Medical): No    Lack of Transportation (Non-Medical): No  Physical Activity: Not on file  Stress: Not on file  Social Connections: Socially Isolated (06/13/2023)   Social Connection and Isolation Panel    Frequency of Communication with Friends and Family: More than three times a week    Frequency of Social Gatherings with Friends and Family: More than three times a week    Attends Religious Services: Never    Database administrator or Organizations: No    Attends Banker Meetings: Never    Marital Status: Widowed  Intimate Partner Violence: Not At Risk (06/13/2023)   Humiliation, Afraid, Rape, and Kick questionnaire    Fear of Current or Ex-Partner: No    Emotionally Abused: No    Physically Abused: No    Sexually Abused: No    Family History  Problem Relation Age of Onset   Cancer Mother    Hypertension Mother    COPD Mother    Asthma Sister    Psoriasis Sister    Epilepsy Brother     Allergies  Allergen Reactions   Depakote [Divalproex Sodium] Shortness Of Breath   Pregabalin Other (See Comments), Rash and Shortness Of Breath    Other Reaction: falls; decreased coordination  Pt ts that she passes out when she takes this med.   Other Reaction: falls; decreased coordination    Pt ts that she passes out when she takes this med.  Other Reaction: falls; decreased coordination, , Pt ts that she passes out when she takes this med.   Valproic Acid  Other (See Comments) and Shortness Of Breath    Other Reaction: Other reaction  SOB   Levetiracetam Itching and Rash    Review of Systems  All other systems reviewed and are negative.      Objective:   Ht 5' 3 (1.6 m)   Wt 202 lb 12.8 oz (92 kg)   SpO2 98%   BMI 35.92 kg/m   Vitals:   09/30/23 1503 09/30/23 1525 09/30/23 1526   Height: 5' 3 (1.6 m)    Weight: 202 lb 12.8 oz (92 kg)    SpO2: 98% 97% 98%  BMI (Calculated): 35.93      Physical Exam Vitals and nursing note reviewed.  Constitutional:      Appearance: Normal appearance. She is normal weight.  HENT:     Head: Normocephalic.  Eyes:     Extraocular Movements: Extraocular  movements intact.     Conjunctiva/sclera: Conjunctivae normal.     Pupils: Pupils are equal, round, and reactive to light.  Cardiovascular:     Rate and Rhythm: Normal rate.  Pulmonary:     Effort: Pulmonary effort is normal.  Neurological:     General: No focal deficit present.     Mental Status: She is alert and oriented to person, place, and time. Mental status is at baseline.  Psychiatric:        Mood and Affect: Mood normal.        Behavior: Behavior normal.        Thought Content: Thought content normal.      Results for orders placed or performed in visit on 09/30/23  POCT Glucose (CBG)  Result Value Ref Range   POC Glucose 122 (A) 70 - 99 mg/dl    Recent Results (from the past 2160 hours)  POCT Glucose (CBG)     Status: Abnormal   Collection Time: 09/30/23  3:09 PM  Result Value Ref Range   POC Glucose 122 (A) 70 - 99 mg/dl       Assessment & Plan Type 2 diabetes mellitus with hyperglycemia, without long-term current use of insulin  (HCC) Continue current diabetes POC, as patient has been well controlled on current regimen.  Will adjust meds if needed based on labs.  Rib pain on left side Tail bone pain Sending pt for x-rays today.  Will call with the results when they are available.   Syncope and collapse Blurry vision, bilateral Setting up Holter monitor for pt.  She is aware we will call to schedule.  Will also get Carotid dopplers.      Return in about 2 weeks (around 10/14/2023).   Total time spent: 20 minutes  ALAN CHRISTELLA ARRANT, FNP  09/30/2023   This document may have been prepared by [facility] Voice Recognition software and as such  may include unintentional dictation errors.

## 2023-10-01 ENCOUNTER — Ambulatory Visit: Payer: Self-pay

## 2023-10-01 DIAGNOSIS — R55 Syncope and collapse: Secondary | ICD-10-CM

## 2023-10-01 LAB — VITAMIN B12: Vitamin B-12: 397 pg/mL (ref 232–1245)

## 2023-10-04 ENCOUNTER — Encounter: Payer: Self-pay | Admitting: Family

## 2023-10-04 NOTE — Assessment & Plan Note (Signed)
 Continue current diabetes POC, as patient has been well controlled on current regimen.  Will adjust meds if needed based on labs.

## 2023-10-07 ENCOUNTER — Other Ambulatory Visit: Payer: Self-pay | Admitting: Medical Genetics

## 2023-10-08 ENCOUNTER — Ambulatory Visit

## 2023-10-08 DIAGNOSIS — R55 Syncope and collapse: Secondary | ICD-10-CM

## 2023-10-12 ENCOUNTER — Other Ambulatory Visit (HOSPITAL_BASED_OUTPATIENT_CLINIC_OR_DEPARTMENT_OTHER): Payer: Self-pay

## 2023-10-14 ENCOUNTER — Ambulatory Visit: Admitting: Family

## 2023-10-14 VITALS — BP 130/82 | HR 77 | Ht 63.0 in | Wt 197.4 lb

## 2023-10-14 DIAGNOSIS — R55 Syncope and collapse: Secondary | ICD-10-CM

## 2023-10-14 DIAGNOSIS — E1165 Type 2 diabetes mellitus with hyperglycemia: Secondary | ICD-10-CM | POA: Diagnosis not present

## 2023-10-14 DIAGNOSIS — H538 Other visual disturbances: Secondary | ICD-10-CM | POA: Diagnosis not present

## 2023-10-15 ENCOUNTER — Encounter: Payer: Self-pay | Admitting: Family

## 2023-10-15 MED ORDER — NOVOLOG FLEXPEN 100 UNIT/ML ~~LOC~~ SOPN: PEN_INJECTOR | SUBCUTANEOUS | 11 refills | Status: AC

## 2023-10-15 NOTE — Progress Notes (Signed)
 Established Patient Office Visit  Subjective:  Patient ID: Diana Quinn, female    DOB: 1966/02/03  Age: 58 y.o. MRN: [id number]  Chief Complaint  Patient presents with   Follow-up    2 week follow up    Patient is here today for her two week follow up.   She is feeling much better than not her last appointment.  She saw her ophthalmologist since then, and they let her know that the scar issue in her eye was very irritated. They gave her additional eye drops to try which did help with her symptoms.  She is still having blood sugar changes frequently.  Asks if there is something we can do abut this.        No other concerns at this time.   Past Medical History:  Diagnosis Date   Abdominal pain, acute, right upper quadrant 12/12/2014   Abnormal weight loss 05/10/2014   Acute metabolic encephalopathy 09/23/2022   Allergic reaction 09/17/2016   AMS (altered mental status) 06/12/2023   Anemia    Ankle fracture 09/10/2022   Appetite loss 01/14/2015   Bilateral ankle fractures 09/25/2022   CAD (coronary artery disease) 08/10/2020   Closed fracture of left distal tibia 09/10/2022   Closed torus fracture of upper end of right fibula 04/13/2019   E. coli UTI 09/18/2022   EKG, abnormal 02/27/2013   Elevated lithium  level 05/10/2014   Gastric bypass status for obesity    H/O unstable angina    History of seizure 09/24/2022   Hx SBO 09/30/2014   Hypoglycemia    Hypokalemia 10/03/2022   Hyponatremia 09/17/2022   MI (myocardial infarction) [facility])    Migraine headache 03/24/2011   Note: Unchanged - sees [facility] neurology, Dr. German  Note: Unchanged - sees [facility] neurolog[facility], Dr. German Deal of this note might be different from the original.  Note: Unchanged - sees [facility] neurology, Dr. German     Occipital headache (Fourth Area of Pain) (Bilateral) (L>R) 03/31/2018   Orthostatic hypotension    Osteoporosis    PEG tube malfunction (HCC) 01/13/2015   Poisoning by  unspecified drugs, medicaments and biological substances, accidental (unintentional), initial encounter 02/27/2013   Polysubstance abuse (HCC) 11/30/2016   SBO (small bowel obstruction) (HCC) 09/23/2014   Sciatic nerve disease    Seizures (HCC)    Skin infection at gastrostomy tube site (HCC) 12/29/2014   Suicidal behavior 02/27/2013   Thyroid  disease    TIA (transient ischemic attack) 04/22/2017    Past Surgical History:  Procedure Laterality Date   ABDOMINAL ADHESION SURGERY     ABDOMINAL HYSTERECTOMY     CHOLECYSTECTOMY     DILATION AND CURETTAGE OF UTERUS     ESOPHAGOGASTRODUODENOSCOPY     GASTRIC BYPASS     GASTRIC BYPASS OPEN     revision   GASTROSTOMY W/ FEEDING TUBE     HERNIA REPAIR     OOPHORECTOMY     SPLENECTOMY     TONSILLECTOMY      Social History   Socioeconomic History   Marital status: Widowed    Spouse name: Not on file   Number of children: Not on file   Years of education: Not on file   Highest education level: Not on file  Occupational History   Not on file  Tobacco Use   Smoking status: Never   Smokeless tobacco: Never  Vaping Use   Vaping status: Never Used  Substance and Sexual Activity  Alcohol use: Not Currently   Drug use: Never   Sexual activity: Not on file  Other Topics Concern   Not on file  Social History Narrative   Not on file   Social Drivers of Health   Financial Resource Strain: Not on file  Food Insecurity: No Food Insecurity (06/13/2023)   Hunger Vital Sign    Worried About Running Out of Food in the Last Year: Never true    Ran Out of Food in the Last Year: Never true  Transportation Needs: No Transportation Needs (06/13/2023)   PRAPARE - Administrator, Civil Service (Medical): No    Lack of Transportation (Non-Medical): No  Physical Activity: Not on file  Stress: Not on file  Social Connections: Socially Isolated (06/13/2023)   Social Connection and Isolation Panel    Frequency of Communication with  Friends and Family: More than three times a week    Frequency of Social Gatherings with Friends and Family: More than three times a week    Attends Religious Services: Never    Database administrator or Organizations: No    Attends Banker Meetings: Never    Marital Status: Widowed  Intimate Partner Violence: Not At Risk (06/13/2023)   Humiliation, Afraid, Rape, and Kick questionnaire    Fear of Current or Ex-Partner: No    Emotionally Abused: No    Physically Abused: No    Sexually Abused: No    Family History  Problem Relation Age of Onset   Cancer Mother    Hypertension Mother    COPD Mother    Asthma Sister    Psoriasis Sister    Epilepsy Brother     Allergies  Allergen Reactions   Depakote [Divalproex Sodium] Shortness Of Breath   Pregabalin Other (See Comments), Rash and Shortness Of Breath    Other Reaction: falls; decreased coordination  Pt ts that she passes out when she takes this med.   Other Reaction: falls; decreased coordination    Pt ts that she passes out when she takes this med.  Other Reaction: falls; decreased coordination, , Pt ts that she passes out when she takes this med.   Valproic Acid  Other (See Comments) and Shortness Of Breath    Other Reaction: Other reaction  SOB   Levetiracetam Itching and Rash    Review of Systems  All other systems reviewed and are negative.      Objective:   BP 130/82   Pulse 77   Ht 5' 3 (1.6 m)   Wt 197 lb 6.4 oz (89.5 kg)   SpO2 98%   BMI 34.97 kg/m   Vitals:   10/14/23 1432  BP: 130/82  Pulse: 77  Height: 5' 3 (1.6 m)  Weight: 197 lb 6.4 oz (89.5 kg)  SpO2: 98%  BMI (Calculated): 34.98    Physical Exam Vitals and nursing note reviewed.  Constitutional:      Appearance: Normal appearance. She is normal weight.  HENT:     Head: Normocephalic.  Eyes:     Extraocular Movements: Extraocular movements intact.     Conjunctiva/sclera: Conjunctivae normal.     Pupils: Pupils are  equal, round, and reactive to light.  Cardiovascular:     Rate and Rhythm: Normal rate.  Pulmonary:     Effort: Pulmonary effort is normal.  Neurological:     General: No focal deficit present.     Mental Status: She is alert and oriented to person, place, and  time. Mental status is at baseline.  Psychiatric:        Mood and Affect: Mood normal.        Behavior: Behavior normal.        Thought Content: Thought content normal.        Judgment: Judgment normal.      No results found for any visits on 10/14/23.  Recent Results (from the past 2160 hours)  POCT Glucose (CBG)     Status: Abnormal   Collection Time: 09/30/23  3:09 PM  Result Value Ref Range   POC Glucose 122 (A) 70 - 99 mg/dl  Vitamin B12     Status: None   Collection Time: 09/30/23  4:06 PM  Result Value Ref Range   Vitamin B-12 397 232 - 1,245 pg/mL       Assessment & Plan Type 2 diabetes mellitus with hyperglycemia, without long-term current use of insulin  (HCC) Sending rx for rapid acting insulin  for patient to take when sugars are very high.  Will send instructions in RX.   Reassess at follow up.  Syncope and collapse Patient symptoms have resolved at this point. Will reassess as needed.   Blurry vision, bilateral Patient is seen by Ophthalmology, who manage this condition.  She is well controlled with current therapy.   Will defer to them for further changes to plan of care.     Return in about 1 month (around 11/14/2023) for F/U.   Total time spent: 20 minutes  ALAN CHRISTELLA ARRANT, FNP  10/14/2023   This document may have been prepared by [facility] Voice Recognition software and as such may include unintentional dictation errors.

## 2023-10-15 NOTE — Assessment & Plan Note (Signed)
 Sending rx for rapid acting insulin  for patient to take when sugars are very high.  Will send instructions in RX.   Reassess at follow up.

## 2023-10-15 NOTE — Addendum Note (Signed)
 Addended by: ORLEAN PALMA on: 10/15/2023 07:04 PM   Modules accepted: Orders

## 2023-10-16 ENCOUNTER — Other Ambulatory Visit: Payer: Self-pay | Admitting: Family

## 2023-10-16 DIAGNOSIS — R55 Syncope and collapse: Secondary | ICD-10-CM

## 2023-10-30 ENCOUNTER — Other Ambulatory Visit: Payer: Self-pay | Admitting: Family

## 2023-10-30 ENCOUNTER — Other Ambulatory Visit: Payer: Self-pay

## 2023-10-30 DIAGNOSIS — G8929 Other chronic pain: Secondary | ICD-10-CM

## 2023-10-30 DIAGNOSIS — M7918 Myalgia, other site: Secondary | ICD-10-CM

## 2023-10-30 MED ORDER — CYANOCOBALAMIN 1000 MCG/ML IJ SOLN: 1000.0000 ug | INTRAMUSCULAR | 11 refills | Status: AC

## 2023-11-02 ENCOUNTER — Ambulatory Visit: Payer: Self-pay | Admitting: Cardiology

## 2023-11-17 ENCOUNTER — Encounter: Payer: Self-pay | Admitting: Family

## 2023-11-17 ENCOUNTER — Ambulatory Visit: Admitting: Family

## 2023-11-17 VITALS — BP 108/70 | HR 68 | Ht 63.0 in | Wt 195.6 lb

## 2023-11-17 DIAGNOSIS — D509 Iron deficiency anemia, unspecified: Secondary | ICD-10-CM

## 2023-11-17 DIAGNOSIS — I10 Essential (primary) hypertension: Secondary | ICD-10-CM

## 2023-11-17 DIAGNOSIS — E538 Deficiency of other specified B group vitamins: Secondary | ICD-10-CM

## 2023-11-17 DIAGNOSIS — E782 Mixed hyperlipidemia: Secondary | ICD-10-CM

## 2023-11-17 DIAGNOSIS — E1165 Type 2 diabetes mellitus with hyperglycemia: Secondary | ICD-10-CM | POA: Diagnosis not present

## 2023-11-17 DIAGNOSIS — F331 Major depressive disorder, recurrent, moderate: Secondary | ICD-10-CM

## 2023-11-17 DIAGNOSIS — E559 Vitamin D deficiency, unspecified: Secondary | ICD-10-CM

## 2023-11-17 DIAGNOSIS — E039 Hypothyroidism, unspecified: Secondary | ICD-10-CM

## 2023-11-17 MED ORDER — TOPIRAMATE ER 200 MG PO CAP24: 1.0000 | ORAL_CAPSULE | Freq: Every day | ORAL | 1 refills | Status: DC

## 2023-11-17 NOTE — Assessment & Plan Note (Signed)
 Checking labs today.  Will continue supplements as needed.   - Vitamin D  - Vitamin B12 - TSH - Iron, TIBC, Fer

## 2023-11-17 NOTE — Assessment & Plan Note (Signed)
 Patient stable.  Well controlled with current therapy.   Continue current meds.

## 2023-11-17 NOTE — Assessment & Plan Note (Signed)
 Checking labs today. Will call pt. With results  Continue current diabetes POC, as patient has been well controlled on current regimen.  Will adjust meds if needed based on labs.   -CBC w/Diff -CMP w/eGFR -Hemoglobin A1C

## 2023-11-17 NOTE — Assessment & Plan Note (Signed)
 Blood pressure well controlled with current medications.  Continue current therapy.  Will reassess at follow up.   - CBC w/Diff - CMP w/eGFR

## 2023-11-17 NOTE — Assessment & Plan Note (Signed)
 Checking labs today.  Continue current therapy for lipid control. Will modify as needed based on labwork results.   -CMP w/eGFR -Lipid Panel

## 2023-11-17 NOTE — Progress Notes (Unsigned)
 Established Patient Office Visit  Subjective:  Patient ID: Diana Quinn, female    DOB: 05-28-1965  Age: 58 y.o. MRN: [id number]  Chief Complaint  Patient presents with   Follow-up    1 month follow up    Patient is here today for her 1 month follow up.  She has been feeling fairly well since last appointment.   She does have additional concerns to discuss today.  Had her heart monitor, showed only occasional sinus tachycardia.  She needs a refill of her topamax , says that she owes her neurology office money, so she asks if we can temporarily refill her meds.   Labs are due today.  She needs refills.   I have reviewed her active problem list, medication list, allergies, notes from last encounter, lab results for her appointment today.      No other concerns at this time.   Past Medical History:  Diagnosis Date   Abdominal pain, acute, right upper quadrant 12/12/2014   Abnormal weight loss 05/10/2014   Acute metabolic encephalopathy 09/23/2022   Allergic reaction 09/17/2016   AMS (altered mental status) 06/12/2023   Anemia    Ankle fracture 09/10/2022   Appetite loss 01/14/2015   Bilateral ankle fractures 09/25/2022   CAD (coronary artery disease) 08/10/2020   Closed fracture of left distal tibia 09/10/2022   Closed torus fracture of upper end of right fibula 04/13/2019   E. coli UTI 09/18/2022   EKG, abnormal 02/27/2013   Elevated lithium  level 05/10/2014   Gastric bypass status for obesity    H/O unstable angina    History of seizure 09/24/2022   Hx SBO 09/30/2014   Hypoglycemia    Hypokalemia 10/03/2022   Hyponatremia 09/17/2022   MI (myocardial infarction) [facility])    Migraine headache 03/24/2011   Note: Unchanged - sees [facility] neurolog[facility], Dr. German  Note: Unchanged - sees [facility] neurolog[facility], Dr. German Deal of this note might be different from the original.  Note: Unchanged - sees [facility] neurology, Dr. German     Occipital headache (Fourth Area of  Pain) (Bilateral) (L>R) 03/31/2018   Orthostatic hypotension    Osteoporosis    PEG tube malfunction (HCC) 01/13/2015   Poisoning by unspecified drugs, medicaments and biological substances, accidental (unintentional), initial encounter 02/27/2013   Polysubstance abuse (HCC) 11/30/2016   SBO (small bowel obstruction) (HCC) 09/23/2014   Sciatic nerve disease    Seizures (HCC)    Skin infection at gastrostomy tube site (HCC) 12/29/2014   Suicidal behavior 02/27/2013   Thyroid  disease    TIA (transient ischemic attack) 04/22/2017    Past Surgical History:  Procedure Laterality Date   ABDOMINAL ADHESION SURGERY     ABDOMINAL HYSTERECTOMY     CHOLECYSTECTOMY     DILATION AND CURETTAGE OF UTERUS     ESOPHAGOGASTRODUODENOSCOPY     GASTRIC BYPASS     GASTRIC BYPASS OPEN     revision   GASTROSTOMY W/ FEEDING TUBE     HERNIA REPAIR     OOPHORECTOMY     SPLENECTOMY     TONSILLECTOMY      Social History   Socioeconomic History   Marital status: Widowed    Spouse name: Not on file   Number of children: Not on file   Years of education: Not on file   Highest education level: Not on file  Occupational History   Not on file  Tobacco Use   Smoking status: Never   Smokeless  tobacco: Never  Vaping Use   Vaping status: Never Used  Substance and Sexual Activity   Alcohol use: Not Currently   Drug use: Never   Sexual activity: Not on file  Other Topics Concern   Not on file  Social History Narrative   Not on file   Social Drivers of Health   Financial Resource Strain: Not on file  Food Insecurity: No Food Insecurity (06/13/2023)   Hunger Vital Sign    Worried About Running Out of Food in the Last Year: Never true    Ran Out of Food in the Last Year: Never true  Transportation Needs: No Transportation Needs (06/13/2023)   PRAPARE - Administrator, Civil Service (Medical): No    Lack of Transportation (Non-Medical): No  Physical Activity: Not on file  Stress: Not on  file  Social Connections: Socially Isolated (06/13/2023)   Social Connection and Isolation Panel    Frequency of Communication with Friends and Family: More than three times a week    Frequency of Social Gatherings with Friends and Family: More than three times a week    Attends Religious Services: Never    Database administrator or Organizations: No    Attends Banker Meetings: Never    Marital Status: Widowed  Intimate Partner Violence: Not At Risk (06/13/2023)   Humiliation, Afraid, Rape, and Kick questionnaire    Fear of Current or Ex-Partner: No    Emotionally Abused: No    Physically Abused: No    Sexually Abused: No    Family History  Problem Relation Age of Onset   Cancer Mother    Hypertension Mother    COPD Mother    Asthma Sister    Psoriasis Sister    Epilepsy Brother     Allergies  Allergen Reactions   Depakote [Divalproex Sodium] Shortness Of Breath   Pregabalin Other (See Comments), Rash and Shortness Of Breath    Other Reaction: falls; decreased coordination  Pt ts that she passes out when she takes this med.   Other Reaction: falls; decreased coordination    Pt ts that she passes out when she takes this med.  Other Reaction: falls; decreased coordination, , Pt ts that she passes out when she takes this med.   Valproic Acid  Other (See Comments) and Shortness Of Breath    Other Reaction: Other reaction  SOB   Levetiracetam Itching and Rash    Review of Systems  Constitutional:  Positive for malaise/fatigue.  Cardiovascular:  Positive for palpitations.  Neurological:  Positive for dizziness and weakness.  All other systems reviewed and are negative.      Objective:   BP 108/70   Pulse 68   Ht 5' 3 (1.6 m)   Wt 195 lb 9.6 oz (88.7 kg)   SpO2 98%   BMI 34.65 kg/m   Vitals:   11/17/23 1454  BP: 108/70  Pulse: 68  Height: 5' 3 (1.6 m)  Weight: 195 lb 9.6 oz (88.7 kg)  SpO2: 98%  BMI (Calculated): 34.66    Physical  Exam Vitals and nursing note reviewed.  Constitutional:      Appearance: Normal appearance. She is normal weight.  HENT:     Head: Normocephalic.  Eyes:     Extraocular Movements: Extraocular movements intact.     Conjunctiva/sclera: Conjunctivae normal.     Pupils: Pupils are equal, round, and reactive to light.  Cardiovascular:     Rate and Rhythm: Normal  rate.     Pulses: Normal pulses.     Heart sounds: Normal heart sounds.  Pulmonary:     Effort: Pulmonary effort is normal.  Musculoskeletal:        General: Normal range of motion.     Cervical back: Normal range of motion.  Neurological:     General: No focal deficit present.     Mental Status: She is alert and oriented to person, place, and time. Mental status is at baseline.  Psychiatric:        Mood and Affect: Mood normal.        Behavior: Behavior normal.        Thought Content: Thought content normal.        Judgment: Judgment normal.      No results found for any visits on 11/17/23.  Recent Results (from the past 2160 hours)  POCT Glucose (CBG)     Status: Abnormal   Collection Time: 09/30/23  3:09 PM  Result Value Ref Range   POC Glucose 122 (A) 70 - 99 mg/dl  Vitamin B12     Status: None   Collection Time: 09/30/23  4:06 PM  Result Value Ref Range   Vitamin B-12 397 232 - 1,245 pg/mL       Assessment & Plan Essential hypertension Blood pressure well controlled with current medications.  Continue current therapy.  Will reassess at follow up.   - CBC w/Diff - CMP w/eGFR  Major depressive disorder, recurrent episode, moderate (HCC) Patient stable.  Well controlled with current therapy.   Continue current meds.   Type 2 diabetes mellitus with hyperglycemia, without long-term current use of insulin  (HCC) Checking labs today. Will call pt. With results  Continue current diabetes POC, as patient has been well controlled on current regimen.  Will adjust meds if needed based on labs.   -CBC  w/Diff -CMP w/eGFR -Hemoglobin A1C  B12 deficiency due to diet Vitamin D  deficiency Acquired hypothyroidism Iron deficiency anemia, unspecified iron deficiency anemia type Checking labs today.  Will continue supplements as needed.   - Vitamin D  - Vitamin B12 - TSH - Iron, TIBC, Fer  Mixed hyperlipidemia Checking labs today.  Continue current therapy for lipid control. Will modify as needed based on labwork results.   -CMP w/eGFR -Lipid Panel    Return in about 1 month (around 12/17/2023) for F/U.   Total time spent: 20 minutes  ALAN CHRISTELLA ARRANT, FNP  11/17/2023   This document may have been prepared by [facility] Voice Recognition software and as such may include unintentional dictation errors.

## 2023-11-18 LAB — CMP14+EGFR
ALT: 24 IU/L (ref 0–32)
AST: 23 IU/L (ref 0–40)
Albumin: 4 g/dL (ref 3.8–4.9)
Alkaline Phosphatase: 218 IU/L — ABNORMAL HIGH (ref 44–121)
BUN/Creatinine Ratio: 9 (ref 9–23)
BUN: 10 mg/dL (ref 6–24)
Bilirubin Total: 0.2 mg/dL (ref 0.0–1.2)
CO2: 19 mmol/L — ABNORMAL LOW (ref 20–29)
Calcium: 8.8 mg/dL (ref 8.7–10.2)
Chloride: 108 mmol/L — ABNORMAL HIGH (ref 96–106)
Creatinine, Ser: 1.12 mg/dL — ABNORMAL HIGH (ref 0.57–1.00)
Globulin, Total: 2.3 g/dL (ref 1.5–4.5)
Glucose: 124 mg/dL — ABNORMAL HIGH (ref 70–99)
Potassium: 4.2 mmol/L (ref 3.5–5.2)
Sodium: 141 mmol/L (ref 134–144)
Total Protein: 6.3 g/dL (ref 6.0–8.5)
eGFR: 58 mL/min/1.73 — ABNORMAL LOW (ref >59)

## 2023-11-18 LAB — CBC WITH DIFFERENTIAL/PLATELET
Basophils Absolute: 0.1 x10E3/uL (ref 0.0–0.2)
Basos: 1 %
EOS (ABSOLUTE): 0.1 x10E3/uL (ref 0.0–0.4)
Eos: 1 %
Hematocrit: 41.1 % (ref 34.0–46.6)
Hemoglobin: 13 g/dL (ref 11.1–15.9)
Immature Grans (Abs): 0.1 x10E3/uL (ref 0.0–0.1)
Immature Granulocytes: 1 %
Lymphocytes Absolute: 1.6 x10E3/uL (ref 0.7–3.1)
Lymphs: 18 %
MCH: 32.1 pg (ref 26.6–33.0)
MCHC: 31.6 g/dL (ref 31.5–35.7)
MCV: 102 fL — ABNORMAL HIGH (ref 79–97)
Monocytes Absolute: 0.8 x10E3/uL (ref 0.1–0.9)
Monocytes: 9 %
Neutrophils Absolute: 6.1 x10E3/uL (ref 1.4–7.0)
Neutrophils: 70 %
Platelets: 307 x10E3/uL (ref 150–450)
RBC: 4.05 x10E6/uL (ref 3.77–5.28)
RDW: 12.1 % (ref 11.7–15.4)
WBC: 8.7 x10E3/uL (ref 3.4–10.8)

## 2023-11-18 LAB — LIPID PANEL
Chol/HDL Ratio: 1.6 ratio (ref 0.0–4.4)
Cholesterol, Total: 145 mg/dL (ref 100–199)
HDL: 89 mg/dL (ref >39)
LDL Chol Calc (NIH): 35 mg/dL (ref 0–99)
Triglycerides: 124 mg/dL (ref 0–149)
VLDL Cholesterol Cal: 21 mg/dL (ref 5–40)

## 2023-11-18 LAB — HEMOGLOBIN A1C
Est. average glucose Bld gHb Est-mCnc: 126 mg/dL
Hgb A1c MFr Bld: 6 % — ABNORMAL HIGH (ref 4.8–5.6)

## 2023-11-18 LAB — VITAMIN B12: Vitamin B-12: 370 pg/mL (ref 232–1245)

## 2023-11-18 LAB — TSH: TSH: 0.823 u[IU]/mL (ref 0.450–4.500)

## 2023-11-18 LAB — VITAMIN D 25 HYDROXY (VIT D DEFICIENCY, FRACTURES): Vit D, 25-Hydroxy: 22.9 ng/mL — ABNORMAL LOW (ref 30.0–100.0)

## 2023-11-19 ENCOUNTER — Ambulatory Visit: Payer: Self-pay

## 2023-11-21 ENCOUNTER — Encounter: Payer: Self-pay | Admitting: Family

## 2023-12-09 ENCOUNTER — Other Ambulatory Visit: Payer: Self-pay | Admitting: Family

## 2023-12-10 ENCOUNTER — Other Ambulatory Visit: Payer: Self-pay | Admitting: Family

## 2023-12-10 ENCOUNTER — Other Ambulatory Visit: Payer: Self-pay

## 2023-12-10 DIAGNOSIS — G8929 Other chronic pain: Secondary | ICD-10-CM

## 2023-12-10 DIAGNOSIS — M7918 Myalgia, other site: Secondary | ICD-10-CM

## 2023-12-18 ENCOUNTER — Ambulatory Visit: Admitting: Family

## 2023-12-29 ENCOUNTER — Other Ambulatory Visit: Payer: Self-pay | Admitting: Cardiology

## 2023-12-29 DIAGNOSIS — G8929 Other chronic pain: Secondary | ICD-10-CM

## 2023-12-29 DIAGNOSIS — M7918 Myalgia, other site: Secondary | ICD-10-CM

## 2023-12-31 ENCOUNTER — Other Ambulatory Visit: Payer: Self-pay

## 2023-12-31 MED ORDER — UBRELVY 100 MG PO TABS: 100.0000 mg | ORAL_TABLET | ORAL | 6 refills | Status: DC | PRN

## 2024-01-05 ENCOUNTER — Ambulatory Visit: Admitting: Family

## 2024-01-05 ENCOUNTER — Other Ambulatory Visit: Payer: Self-pay | Admitting: Medical Genetics

## 2024-01-05 DIAGNOSIS — Z006 Encounter for examination for normal comparison and control in clinical research program: Secondary | ICD-10-CM

## 2024-01-12 ENCOUNTER — Encounter: Payer: Self-pay | Admitting: Family

## 2024-01-12 ENCOUNTER — Ambulatory Visit: Admitting: Family

## 2024-01-12 VITALS — BP 106/68 | HR 56 | Ht 63.0 in | Wt 200.6 lb

## 2024-01-12 DIAGNOSIS — E782 Mixed hyperlipidemia: Secondary | ICD-10-CM

## 2024-01-12 DIAGNOSIS — M5442 Lumbago with sciatica, left side: Secondary | ICD-10-CM

## 2024-01-12 DIAGNOSIS — M25372 Other instability, left ankle: Secondary | ICD-10-CM

## 2024-01-12 DIAGNOSIS — M47816 Spondylosis without myelopathy or radiculopathy, lumbar region: Secondary | ICD-10-CM

## 2024-01-12 DIAGNOSIS — M5441 Lumbago with sciatica, right side: Secondary | ICD-10-CM

## 2024-01-12 DIAGNOSIS — G8929 Other chronic pain: Secondary | ICD-10-CM

## 2024-01-12 DIAGNOSIS — M51362 Other intervertebral disc degeneration, lumbar region with discogenic back pain and lower extremity pain: Secondary | ICD-10-CM

## 2024-01-12 DIAGNOSIS — M25371 Other instability, right ankle: Secondary | ICD-10-CM

## 2024-01-12 DIAGNOSIS — R300 Dysuria: Secondary | ICD-10-CM | POA: Diagnosis not present

## 2024-01-12 DIAGNOSIS — E1165 Type 2 diabetes mellitus with hyperglycemia: Secondary | ICD-10-CM

## 2024-01-12 DIAGNOSIS — H6122 Impacted cerumen, left ear: Secondary | ICD-10-CM

## 2024-01-12 DIAGNOSIS — G43E09 Chronic migraine with aura, not intractable, without status migrainosus: Secondary | ICD-10-CM

## 2024-01-12 DIAGNOSIS — I10 Essential (primary) hypertension: Secondary | ICD-10-CM | POA: Diagnosis not present

## 2024-01-12 LAB — POCT URINALYSIS DIPSTICK
Blood, UA: NEGATIVE
Glucose, UA: NEGATIVE
Ketones, UA: NEGATIVE
Nitrite, UA: NEGATIVE
Protein, UA: POSITIVE — AB
Spec Grav, UA: >=1.03 — AB (ref 1.010–1.025)
Urobilinogen, UA: 0.2 U/dL
pH, UA: 6 (ref 5.0–8.0)

## 2024-01-12 MED ORDER — BUTALBITAL-APAP-CAFF-COD 50-325-40-30 MG PO CAPS: 1.0000 | ORAL_CAPSULE | Freq: Four times a day (QID) | ORAL | 2 refills | Status: DC | PRN

## 2024-01-12 MED ORDER — TIZANIDINE HCL 4 MG PO TABS: 8.0000 mg | ORAL_TABLET | Freq: Three times a day (TID) | ORAL | 1 refills | Status: DC

## 2024-01-12 MED ORDER — ATORVASTATIN CALCIUM 40 MG PO TABS: 40.0000 mg | ORAL_TABLET | Freq: Every day | ORAL | 3 refills | Status: AC

## 2024-01-12 MED ORDER — EMGALITY 120 MG/ML ~~LOC~~ SOAJ: 1.0000 mL | SUBCUTANEOUS | 3 refills | Status: AC

## 2024-01-12 MED ORDER — GABAPENTIN 600 MG PO TABS: 600.0000 mg | ORAL_TABLET | Freq: Four times a day (QID) | ORAL | 3 refills | Status: DC

## 2024-01-12 MED ORDER — EMGALITY 120 MG/ML ~~LOC~~ SOAJ: 240.0000 mg | Freq: Once | SUBCUTANEOUS | 0 refills | Status: AC

## 2024-01-12 MED ORDER — TROKENDI XR 200 MG PO CP24: 200.0000 mg | ORAL_CAPSULE | Freq: Every day | ORAL | 3 refills | Status: AC

## 2024-01-12 MED ORDER — PANTOPRAZOLE SODIUM 40 MG PO TBEC: 40.0000 mg | DELAYED_RELEASE_TABLET | Freq: Every day | ORAL | 3 refills | Status: AC

## 2024-01-12 MED ORDER — UBRELVY 100 MG PO TABS: 100.0000 mg | ORAL_TABLET | ORAL | 6 refills | Status: AC | PRN

## 2024-01-12 MED ORDER — DICLOFENAC SODIUM 3 % EX GEL: 1.0000 | Freq: Two times a day (BID) | CUTANEOUS | 3 refills | Status: AC

## 2024-01-14 ENCOUNTER — Telehealth: Payer: Self-pay | Admitting: Family

## 2024-01-14 ENCOUNTER — Ambulatory Visit: Payer: Self-pay

## 2024-01-14 NOTE — Telephone Encounter (Signed)
 Patient left VM stating her Fioricet  needs a PA.

## 2024-01-17 ENCOUNTER — Encounter: Payer: Self-pay | Admitting: Family

## 2024-01-17 NOTE — Assessment & Plan Note (Signed)
 Setting patient up for referral to Neurosurgery .  Will defer to them for further treatment changes.  Reassess at follow up.  Continue current meds.  Refills sent for Tizanidine , gabapentin 

## 2024-01-17 NOTE — Progress Notes (Signed)
 Established Patient Office Visit  Subjective:  Patient ID: Diana Quinn, female    DOB: 1965/08/01  Age: 58 y.o. MRN: [id number]  Chief Complaint  Patient presents with   Follow-up    1 month follow up    Patient is here today for her 1 month follow up.  She has been feeling fairly well since last appointment.   She does have additional concerns to discuss today.  - Left ear impaction, right knee pain, medication refills, and concerns about blood sugar control. - Left ear: Reports sensation of fullness and reduced hearing. Has tried olive oil without relief. Uses earplugs, recently switched to headphones. Right ear had similar issue previously, resolved with a large piece of wax falling out after a shower. - Right knee: Swollen, feels like fluid needs to be drained. Previously received steroid injections. History of patellofemoral arthritis. - Migraines: Reports worsening migraines, including a recent episode lasting three days. Triggers include cigarette smoke, bright lights, and weather changes. History of multiple admissions to [facility] for intractable migraines. - Blood sugar: Reports difficulty lowering high blood sugars with insulin . Last night, blood sugar was 523-532 mg/dL and did not respond to insulin . Today, fasting blood sugar is 145 mg/dL. Reports ketones are brown on test strips, not matching the color chart. - Ankle weakness: Reports ankles are weak and give out, leading to falls and fractures in the past. Requests braces. - Neck and back pain: Reports worsening neck pain with crepitus on turning. History of scoliosis and stenosis in the neck and lumbar spine. - Allergies: Reports symptoms consistent with fall allergies (stuffy nose).  Labs are due today.  She needs refills.   I have reviewed her active problem list, medication list, allergies, notes from last encounter, lab results for her appointment today.      No other concerns at this time.   Past Medical  History:  Diagnosis Date   Abdominal pain, acute, right upper quadrant 12/12/2014   Abnormal weight loss 05/10/2014   Acute metabolic encephalopathy 09/23/2022   Allergic reaction 09/17/2016   AMS (altered mental status) 06/12/2023   Anemia    Ankle fracture 09/10/2022   Appetite loss 01/14/2015   Bilateral ankle fractures 09/25/2022   CAD (coronary artery disease) 08/10/2020   Closed fracture of left distal tibia 09/10/2022   Closed torus fracture of upper end of right fibula 04/13/2019   E. coli UTI 09/18/2022   EKG, abnormal 02/27/2013   Elevated lithium  level 05/10/2014   Gastric bypass status for obesity    H/O unstable angina    History of seizure 09/24/2022   Hx SBO 09/30/2014   Hypoglycemia    Hypokalemia 10/03/2022   Hyponatremia 09/17/2022   MI (myocardial infarction) [facility])    Migraine headache 03/24/2011   Note: Unchanged - sees [facility] neurolog[facility], Dr. German  Note: Unchanged - sees [facility] neurolog[facility], Dr. German Deal of this note might be different from the original.  Note: Unchanged - sees [facility] neurology, Dr. German     Occipital headache (Fourth Area of Pain) (Bilateral) (L>R) 03/31/2018   Orthostatic hypotension    Osteoporosis    PEG tube malfunction (HCC) 01/13/2015   Poisoning by unspecified drugs, medicaments and biological substances, accidental (unintentional), initial encounter 02/27/2013   Polysubstance abuse (HCC) 11/30/2016   SBO (small bowel obstruction) (HCC) 09/23/2014   Sciatic nerve disease    Seizures (HCC)    Skin infection at gastrostomy tube site (HCC) 12/29/2014   Suicidal behavior  02/27/2013   Thyroid  disease    TIA (transient ischemic attack) 04/22/2017    Past Surgical History:  Procedure Laterality Date   ABDOMINAL ADHESION SURGERY     ABDOMINAL HYSTERECTOMY     CHOLECYSTECTOMY     DILATION AND CURETTAGE OF UTERUS     ESOPHAGOGASTRODUODENOSCOPY     GASTRIC BYPASS     GASTRIC BYPASS OPEN     revision   GASTROSTOMY W/ FEEDING  TUBE     HERNIA REPAIR     OOPHORECTOMY     SPLENECTOMY     TONSILLECTOMY      Social History   Socioeconomic History   Marital status: Widowed    Spouse name: Not on file   Number of children: Not on file   Years of education: Not on file   Highest education level: Not on file  Occupational History   Not on file  Tobacco Use   Smoking status: Never   Smokeless tobacco: Never  Vaping Use   Vaping status: Never Used  Substance and Sexual Activity   Alcohol use: Not Currently   Drug use: Never   Sexual activity: Not on file  Other Topics Concern   Not on file  Social History Narrative   Not on file   Social Drivers of Health   Financial Resource Strain: Not on file  Food Insecurity: No Food Insecurity (06/13/2023)   Hunger Vital Sign    Worried About Running Out of Food in the Last Year: Never true    Ran Out of Food in the Last Year: Never true  Transportation Needs: No Transportation Needs (06/13/2023)   PRAPARE - Administrator, Civil Service (Medical): No    Lack of Transportation (Non-Medical): No  Physical Activity: Not on file  Stress: Not on file  Social Connections: Socially Isolated (06/13/2023)   Social Connection and Isolation Panel    Frequency of Communication with Friends and Family: More than three times a week    Frequency of Social Gatherings with Friends and Family: More than three times a week    Attends Religious Services: Never    Database Administrator or Organizations: No    Attends Banker Meetings: Never    Marital Status: Widowed  Intimate Partner Violence: Not At Risk (06/13/2023)   Humiliation, Afraid, Rape, and Kick questionnaire    Fear of Current or Ex-Partner: No    Emotionally Abused: No    Physically Abused: No    Sexually Abused: No    Family History  Problem Relation Age of Onset   Cancer Mother    Hypertension Mother    COPD Mother    Asthma Sister    Psoriasis Sister    Epilepsy Brother      Allergies  Allergen Reactions   Depakote [Divalproex Sodium] Shortness Of Breath   Pregabalin Other (See Comments), Rash and Shortness Of Breath    Other Reaction: falls; decreased coordination  Pt ts that she passes out when she takes this med.   Other Reaction: falls; decreased coordination    Pt ts that she passes out when she takes this med.  Other Reaction: falls; decreased coordination, , Pt ts that she passes out when she takes this med.   Valproic Acid  Other (See Comments) and Shortness Of Breath    Other Reaction: Other reaction  SOB   Levetiracetam Itching and Rash    Review of Systems  HENT:  Positive for congestion, ear pain, hearing  loss, sinus pain and tinnitus.   Musculoskeletal:  Positive for back pain, joint pain and neck pain.  Neurological:  Positive for dizziness and headaches.  All other systems reviewed and are negative.      Objective:   BP 106/68   Pulse (!) 56   Ht 5' 3 (1.6 m)   Wt 200 lb 9.6 oz (91 kg)   SpO2 98%   BMI 35.53 kg/m   Vitals:   01/12/24 1449  BP: 106/68  Pulse: (!) 56  Height: 5' 3 (1.6 m)  Weight: 200 lb 9.6 oz (91 kg)  SpO2: 98%  BMI (Calculated): 35.54    Physical Exam Vitals and nursing note reviewed.  Constitutional:      Appearance: Normal appearance. She is normal weight.  HENT:     Head: Normocephalic.     Right Ear: Ear canal and external ear normal.     Left Ear: External ear normal. There is impacted cerumen.     Nose: Congestion and rhinorrhea present.  Eyes:     Extraocular Movements: Extraocular movements intact.     Conjunctiva/sclera: Conjunctivae normal.     Pupils: Pupils are equal, round, and reactive to light.  Cardiovascular:     Rate and Rhythm: Normal rate.  Pulmonary:     Effort: Pulmonary effort is normal.  Neurological:     General: No focal deficit present.     Mental Status: She is alert and oriented to person, place, and time. Mental status is at baseline.  Psychiatric:         Mood and Affect: Mood normal.        Behavior: Behavior normal.        Thought Content: Thought content normal.        Judgment: Judgment normal.      Results for orders placed or performed in visit on 01/12/24  POCT Urinalysis Dipstick (81002)  Result Value Ref Range   Color, UA Yellow    Clarity, UA Cloudy    Glucose, UA Negative Negative   Bilirubin, UA Moderate    Ketones, UA Negative    Spec Grav, UA >=1.030 (A) 1.010 - 1.025   Blood, UA Negative    pH, UA 6.0 5.0 - 8.0   Protein, UA Positive (A) Negative   Urobilinogen, UA 0.2 0.2 or 1.0 E.U./dL   Nitrite, UA Negative    Leukocytes, UA Small (1+) (A) Negative   Appearance Cloudy    Odor Yes     Recent Results (from the past 2160 hours)  CBC with Diff     Status: Abnormal   Collection Time: 11/17/23  3:43 PM  Result Value Ref Range   WBC 8.7 3.4 - 10.8 x10E3/uL   RBC 4.05 3.77 - 5.28 x10E6/uL   Hemoglobin 13.0 11.1 - 15.9 g/dL   Hematocrit 58.8 65.9 - 46.6 %   MCV 102 (H) 79 - 97 fL   MCH 32.1 26.6 - 33.0 pg   MCHC 31.6 31.5 - 35.7 g/dL   RDW 87.8 88.2 - 84.5 %   Platelets 307 150 - 450 x10E3/uL   Neutrophils 70 Not Estab. %   Lymphs 18 Not Estab. %   Monocytes 9 Not Estab. %   Eos 1 Not Estab. %   Basos 1 Not Estab. %   Neutrophils Absolute 6.1 1.4 - 7.0 x10E3/uL   Lymphocytes Absolute 1.6 0.7 - 3.1 x10E3/uL   Monocytes Absolute 0.8 0.1 - 0.9 x10E3/uL   EOS (ABSOLUTE) 0.1 0.0 -  0.4 x10E3/uL   Basophils Absolute 0.1 0.0 - 0.2 x10E3/uL   Immature Granulocytes 1 Not Estab. %   Immature Grans (Abs) 0.1 0.0 - 0.1 x10E3/uL  Lipid panel     Status: None   Collection Time: 11/17/23  3:43 PM  Result Value Ref Range   Cholesterol, Total 145 100 - 199 mg/dL   Triglycerides 875 0 - 149 mg/dL   HDL 89 >60 mg/dL   VLDL Cholesterol Cal 21 5 - 40 mg/dL   LDL Chol Calc (NIH) 35 0 - 99 mg/dL   Chol/HDL Ratio 1.6 0.0 - 4.4 ratio    Comment:                                   T. Chol/HDL Ratio                                              Men  Women                               1/2 Avg.Risk  3.4    3.3                                   Avg.Risk  5.0    4.4                                2X Avg.Risk  9.6    7.1                                3X Avg.Risk 23.4   11.0   VITAMIN D  25 Hydroxy (Vit-D Deficiency, Fractures)     Status: Abnormal   Collection Time: 11/17/23  3:43 PM  Result Value Ref Range   Vit D, 25-Hydroxy 22.9 (L) 30.0 - 100.0 ng/mL    Comment: Vitamin D  deficiency has been defined by the Institute of Medicine and an Endocrine Society practice guideline as a level of serum 25-OH vitamin D  less than 20 ng/mL (1,2). The Endocrine Society went on to further define vitamin D  insufficiency as a level between 21 and 29 ng/mL (2). 1. IOM (Institute of Medicine). 2010. Dietary reference    intakes for calcium  and D. [place]: The    [employer]. 2. Holick MF, Binkley [place], Bischoff-Ferrari HA, et al.    Evaluation, treatment, and prevention of vitamin D     deficiency: an Endocrine Society clinical practice    guideline. JCEM. 2011 Jul; 96(7):1911-30.   CMP14+EGFR     Status: Abnormal   Collection Time: 11/17/23  3:43 PM  Result Value Ref Range   Glucose 124 (H) 70 - 99 mg/dL   BUN 10 6 - 24 mg/dL   Creatinine, Ser 8.87 (H) 0.57 - 1.00 mg/dL   eGFR 58 (L) >40 fO/fpw/8.26   BUN/Creatinine Ratio 9 9 - 23   Sodium 141 134 - 144 mmol/L   Potassium 4.2 3.5 - 5.2 mmol/L   Chloride 108 (H) 96 - 106 mmol/L   CO2 19 (L) 20 - 29 mmol/L  Calcium  8.8 8.7 - 10.2 mg/dL   Total Protein 6.3 6.0 - 8.5 g/dL   Albumin 4.0 3.8 - 4.9 g/dL   Globulin, Total 2.3 1.5 - 4.5 g/dL   Bilirubin Total 0.2 0.0 - 1.2 mg/dL   Alkaline Phosphatase 218 (H) 44 - 121 IU/L    Comment: **Effective November 23, 2023 Alkaline Phosphatase**   reference interval will be changing to:              Age                Female          Female           0 -  5 days         47 - 127       47 - 127           6 - 10  days         29 - 242       29 - 242          11 - 20 days        109 - 357      109 - 357          21 - 30 days         94 - 494       94 - 494           1 -  2 months      149 - 539      149 - 539           3 -  6 months      131 - 452      131 - 452           7 - 11 months      117 - 401      117 - 401   12 months -  6 years       158 - 369      158 - 369           7 - 12 years       150 - 409      150 - 409               13 years       156 - 435       78 - 227               14 years       114 - 375       64 - 161               15 years        88 - 279       56 - 134               16 years        74 - 207       51 - 121               17 years        63 - 161       47 - 113          18 - 20 years        51 - 125       42 - 106  21 - 50 years         47 - 123       41 - 116          51 - 80 years        49 - 135       51 - 125              >80 years        48 - 129       48 - 129    AST 23 0 - 40 IU/L   ALT 24 0 - 32 IU/L  TSH     Status: None   Collection Time: 11/17/23  3:43 PM  Result Value Ref Range   TSH 0.823 0.450 - 4.500 uIU/mL  Hemoglobin A1c     Status: Abnormal   Collection Time: 11/17/23  3:43 PM  Result Value Ref Range   Hgb A1c MFr Bld 6.0 (H) 4.8 - 5.6 %    Comment:          Prediabetes: 5.7 - 6.4          Diabetes: >6.4          Glycemic control for adults with diabetes: <7.0    Est. average glucose Bld gHb Est-mCnc 126 mg/dL  Vitamin B12     Status: None   Collection Time: 11/17/23  3:43 PM  Result Value Ref Range   Vitamin B-12 370 232 - 1,245 pg/mL  POCT Urinalysis Dipstick (18997)     Status: Abnormal   Collection Time: 01/12/24  4:08 PM  Result Value Ref Range   Color, UA Yellow    Clarity, UA Cloudy    Glucose, UA Negative Negative   Bilirubin, UA Moderate    Ketones, UA Negative    Spec Grav, UA >=1.030 (A) 1.010 - 1.025   Blood, UA Negative    pH, UA 6.0 5.0 - 8.0   Protein, UA Positive (A) Negative   Urobilinogen, UA 0.2 0.2 or 1.0  E.U./dL   Nitrite, UA Negative    Leukocytes, UA Small (1+) (A) Negative   Appearance Cloudy    Odor Yes        Assessment & Plan Lumbar spondylosis Lumbar facet syndrome (Bilateral) (L>R) Degeneration of intervertebral disc of lumbar region with discogenic back pain and lower extremity pain Chronic low back pain (Primary Area of Pain) (Bilateral) (L>R) w/ sciatica Setting patient up for referral to Neurosurgery .  Will defer to them for further treatment changes.  Reassess at follow up.  Continue current meds.  Refills sent for Tizanidine , gabapentin   Chronic musculoskeletal pain Setting patient up for referral to [facility] for braces to help with ankle support, given chronic ankle instability.   Will defer to them for further treatment changes Reassess at follow up.  Dysuria UA in office today WNL.  Pt will let me know if she continues to have symptoms.   Chronic migraine with aura without status migrainosus, not intractable - Emgality starter pack (300 mg loading dose, followed by 150 mg monthly.). - Fioricet  with codeine (30 mg) - Ubrelvy  100 mg RX  Patient stable.  Well controlled with current therapy.   Continue current meds.   Type 2 diabetes mellitus with hyperglycemia, without long-term current use of insulin  (HCC) - Discussed Ozempic for diabetes management. - Advised that Ozempic is administered once weekly via injection. The first four weeks will be a 0.25 mg dose, then increasing to 0.5  mg weekly. RX sent for patient.   - Follow-up in one month to check on Ozempic tolerance and efficacy.  Essential hypertension Blood pressure well controlled with current medications.  Continue current therapy.  Will reassess at follow up.   Mixed hyperlipidemia Continue current therapy for lipid control. Will modify as needed based on labwork results.   Refill sent to pharmacy for Atorvastatin .   Impacted cerumen of left ear Impaction removed in office today.   Suggested that pt could pick up a cleaner from [employer] or similar to help manage future wax issues.      Return in about 1 month (around 02/11/2024).   Total time spent: 40 minutes  ALAN CHRISTELLA ARRANT, FNP  01/12/2024   This document may have been prepared by [facility] Voice Recognition software and as such may include unintentional dictation errors.

## 2024-01-17 NOTE — Assessment & Plan Note (Signed)
 Continue current therapy for lipid control. Will modify as needed based on labwork results.   Refill sent to pharmacy for Atorvastatin .

## 2024-01-17 NOTE — Assessment & Plan Note (Signed)
-   Discussed Ozempic for diabetes management. - Advised that Ozempic is administered once weekly via injection. The first four weeks will be a 0.25 mg dose, then increasing to 0.5 mg weekly. RX sent for patient.   - Follow-up in one month to check on Ozempic tolerance and efficacy.

## 2024-01-17 NOTE — Assessment & Plan Note (Signed)
-   Emgality starter pack (300 mg loading dose, followed by 150 mg monthly.). - Fioricet  with codeine (30 mg) - Ubrelvy  100 mg RX  Patient stable.  Well controlled with current therapy.   Continue current meds.

## 2024-01-17 NOTE — Assessment & Plan Note (Signed)
 Blood pressure well controlled with current medications.  Continue current therapy.  Will reassess at follow up.

## 2024-01-18 ENCOUNTER — Other Ambulatory Visit: Payer: Self-pay | Admitting: Family

## 2024-01-20 MED ORDER — OXCARBAZEPINE 300 MG PO TABS: 600.0000 mg | ORAL_TABLET | Freq: Two times a day (BID) | ORAL | 1 refills | Status: AC

## 2024-01-21 ENCOUNTER — Telehealth: Payer: Self-pay

## 2024-01-21 NOTE — Telephone Encounter (Signed)
 [facility] called about medication interactions. They stated pt's Fioricet  with codeine can decrease the effects of the Ranolazine . They also stated that by taking the Fioricet  with codeine and Caplyta  can cause CNS depression. Please advise.

## 2024-01-25 ENCOUNTER — Inpatient Hospital Stay: Admission: EM | Admit: 2024-01-25 | Discharge: 2024-02-05 | DRG: 391 | Disposition: A | Discharge status: Home Health

## 2024-01-25 ENCOUNTER — Emergency Department

## 2024-01-25 ENCOUNTER — Encounter: Payer: Self-pay | Admitting: Oncology

## 2024-01-25 DIAGNOSIS — Z9884 Bariatric surgery status: Secondary | ICD-10-CM

## 2024-01-25 DIAGNOSIS — E785 Hyperlipidemia, unspecified: Secondary | ICD-10-CM | POA: Diagnosis present

## 2024-01-25 DIAGNOSIS — F259 Schizoaffective disorder, unspecified: Secondary | ICD-10-CM | POA: Diagnosis present

## 2024-01-25 DIAGNOSIS — F419 Anxiety disorder, unspecified: Secondary | ICD-10-CM | POA: Diagnosis present

## 2024-01-25 DIAGNOSIS — Z6836 Body mass index (BMI) 36.0-36.9, adult: Secondary | ICD-10-CM

## 2024-01-25 DIAGNOSIS — Z8249 Family history of ischemic heart disease and other diseases of the circulatory system: Secondary | ICD-10-CM

## 2024-01-25 DIAGNOSIS — E039 Hypothyroidism, unspecified: Secondary | ICD-10-CM | POA: Diagnosis not present

## 2024-01-25 DIAGNOSIS — Z79890 Hormone replacement therapy (postmenopausal): Secondary | ICD-10-CM

## 2024-01-25 DIAGNOSIS — R339 Retention of urine, unspecified: Secondary | ICD-10-CM | POA: Diagnosis present

## 2024-01-25 DIAGNOSIS — I482 Chronic atrial fibrillation, unspecified: Secondary | ICD-10-CM | POA: Diagnosis present

## 2024-01-25 DIAGNOSIS — Z820 Family history of epilepsy and other diseases of the nervous system: Secondary | ICD-10-CM

## 2024-01-25 DIAGNOSIS — N179 Acute kidney failure, unspecified: Secondary | ICD-10-CM | POA: Diagnosis present

## 2024-01-25 DIAGNOSIS — Z794 Long term (current) use of insulin: Secondary | ICD-10-CM

## 2024-01-25 DIAGNOSIS — I2510 Atherosclerotic heart disease of native coronary artery without angina pectoris: Secondary | ICD-10-CM | POA: Diagnosis present

## 2024-01-25 DIAGNOSIS — Z7984 Long term (current) use of oral hypoglycemic drugs: Secondary | ICD-10-CM

## 2024-01-25 DIAGNOSIS — E860 Dehydration: Principal | ICD-10-CM | POA: Diagnosis present

## 2024-01-25 DIAGNOSIS — Z7901 Long term (current) use of anticoagulants: Secondary | ICD-10-CM

## 2024-01-25 DIAGNOSIS — E871 Hypo-osmolality and hyponatremia: Secondary | ICD-10-CM | POA: Diagnosis present

## 2024-01-25 DIAGNOSIS — E119 Type 2 diabetes mellitus without complications: Secondary | ICD-10-CM

## 2024-01-25 DIAGNOSIS — N182 Chronic kidney disease, stage 2 (mild): Secondary | ICD-10-CM | POA: Diagnosis present

## 2024-01-25 DIAGNOSIS — Z5309 Procedure and treatment not carried out because of other contraindication: Secondary | ICD-10-CM | POA: Diagnosis present

## 2024-01-25 DIAGNOSIS — R159 Full incontinence of feces: Secondary | ICD-10-CM | POA: Diagnosis present

## 2024-01-25 DIAGNOSIS — D75839 Thrombocytosis, unspecified: Secondary | ICD-10-CM | POA: Diagnosis present

## 2024-01-25 DIAGNOSIS — Z79899 Other long term (current) drug therapy: Secondary | ICD-10-CM

## 2024-01-25 DIAGNOSIS — I252 Old myocardial infarction: Secondary | ICD-10-CM

## 2024-01-25 DIAGNOSIS — E1165 Type 2 diabetes mellitus with hyperglycemia: Secondary | ICD-10-CM | POA: Diagnosis present

## 2024-01-25 DIAGNOSIS — R569 Unspecified convulsions: Secondary | ICD-10-CM | POA: Diagnosis not present

## 2024-01-25 DIAGNOSIS — E875 Hyperkalemia: Secondary | ICD-10-CM | POA: Diagnosis present

## 2024-01-25 DIAGNOSIS — T43595A Adverse effect of other antipsychotics and neuroleptics, initial encounter: Secondary | ICD-10-CM | POA: Diagnosis present

## 2024-01-25 DIAGNOSIS — L03311 Cellulitis of abdominal wall: Secondary | ICD-10-CM | POA: Diagnosis present

## 2024-01-25 DIAGNOSIS — K633 Ulcer of intestine: Secondary | ICD-10-CM | POA: Diagnosis present

## 2024-01-25 DIAGNOSIS — E559 Vitamin D deficiency, unspecified: Secondary | ICD-10-CM | POA: Diagnosis present

## 2024-01-25 DIAGNOSIS — I10 Essential (primary) hypertension: Secondary | ICD-10-CM | POA: Diagnosis present

## 2024-01-25 DIAGNOSIS — M810 Age-related osteoporosis without current pathological fracture: Secondary | ICD-10-CM | POA: Diagnosis present

## 2024-01-25 DIAGNOSIS — E66812 Obesity, class 2: Secondary | ICD-10-CM | POA: Diagnosis present

## 2024-01-25 DIAGNOSIS — F418 Other specified anxiety disorders: Secondary | ICD-10-CM | POA: Diagnosis present

## 2024-01-25 DIAGNOSIS — Z9081 Acquired absence of spleen: Secondary | ICD-10-CM

## 2024-01-25 DIAGNOSIS — Z825 Family history of asthma and other chronic lower respiratory diseases: Secondary | ICD-10-CM

## 2024-01-25 DIAGNOSIS — F209 Schizophrenia, unspecified: Secondary | ICD-10-CM | POA: Diagnosis present

## 2024-01-25 DIAGNOSIS — K529 Noninfective gastroenteritis and colitis, unspecified: Principal | ICD-10-CM | POA: Diagnosis present

## 2024-01-25 DIAGNOSIS — Z90710 Acquired absence of both cervix and uterus: Secondary | ICD-10-CM

## 2024-01-25 DIAGNOSIS — E872 Acidosis, unspecified: Secondary | ICD-10-CM | POA: Diagnosis present

## 2024-01-25 DIAGNOSIS — T426X5A Adverse effect of other antiepileptic and sedative-hypnotic drugs, initial encounter: Secondary | ICD-10-CM | POA: Diagnosis present

## 2024-01-25 DIAGNOSIS — E538 Deficiency of other specified B group vitamins: Secondary | ICD-10-CM | POA: Diagnosis present

## 2024-01-25 DIAGNOSIS — G928 Other toxic encephalopathy: Secondary | ICD-10-CM | POA: Diagnosis present

## 2024-01-25 DIAGNOSIS — K5289 Other specified noninfective gastroenteritis and colitis: Principal | ICD-10-CM | POA: Diagnosis present

## 2024-01-25 DIAGNOSIS — T43215A Adverse effect of selective serotonin and norepinephrine reuptake inhibitors, initial encounter: Secondary | ICD-10-CM | POA: Diagnosis present

## 2024-01-25 DIAGNOSIS — E1122 Type 2 diabetes mellitus with diabetic chronic kidney disease: Secondary | ICD-10-CM | POA: Diagnosis present

## 2024-01-25 DIAGNOSIS — I129 Hypertensive chronic kidney disease with stage 1 through stage 4 chronic kidney disease, or unspecified chronic kidney disease: Secondary | ICD-10-CM | POA: Diagnosis present

## 2024-01-25 DIAGNOSIS — F319 Bipolar disorder, unspecified: Secondary | ICD-10-CM | POA: Diagnosis present

## 2024-01-25 DIAGNOSIS — Z8673 Personal history of transient ischemic attack (TIA), and cerebral infarction without residual deficits: Secondary | ICD-10-CM

## 2024-01-25 LAB — COMPREHENSIVE METABOLIC PANEL WITH GFR
ALT: 36 U/L (ref 0–44)
AST: 51 U/L — ABNORMAL HIGH (ref 15–41)
Albumin: 3.7 g/dL (ref 3.5–5.0)
Alkaline Phosphatase: 168 U/L — ABNORMAL HIGH (ref 38–126)
Anion gap: 14 (ref 5–15)
BUN: 23 mg/dL — ABNORMAL HIGH (ref 6–20)
CO2: 18 mmol/L — ABNORMAL LOW (ref 22–32)
Calcium: 8.6 mg/dL — ABNORMAL LOW (ref 8.9–10.3)
Chloride: 100 mmol/L (ref 98–111)
Creatinine, Ser: 1.69 mg/dL — ABNORMAL HIGH (ref 0.44–1.00)
GFR, Estimated: 35 mL/min — ABNORMAL LOW (ref >60)
Glucose, Bld: 205 mg/dL — ABNORMAL HIGH (ref 70–99)
Potassium: 5.3 mmol/L — ABNORMAL HIGH (ref 3.5–5.1)
Sodium: 132 mmol/L — ABNORMAL LOW (ref 135–145)
Total Bilirubin: 0.4 mg/dL (ref 0.0–1.2)
Total Protein: 6.5 g/dL (ref 6.5–8.1)

## 2024-01-25 LAB — CBC WITH DIFFERENTIAL/PLATELET
Abs Immature Granulocytes: 0.04 K/uL (ref 0.00–0.07)
Basophils Absolute: 0.1 K/uL (ref 0.0–0.1)
Basophils Relative: 0 %
Eosinophils Absolute: 0.1 K/uL (ref 0.0–0.5)
Eosinophils Relative: 1 %
HCT: 45.3 % (ref 36.0–46.0)
Hemoglobin: 14.6 g/dL (ref 12.0–15.0)
Immature Granulocytes: 0 %
Lymphocytes Relative: 7 %
Lymphs Abs: 1.1 K/uL (ref 0.7–4.0)
MCH: 31.2 pg (ref 26.0–34.0)
MCHC: 32.2 g/dL (ref 30.0–36.0)
MCV: 96.8 fL (ref 80.0–100.0)
Monocytes Absolute: 1.8 K/uL — ABNORMAL HIGH (ref 0.1–1.0)
Monocytes Relative: 11 %
Neutro Abs: 13.9 K/uL — ABNORMAL HIGH (ref 1.7–7.7)
Neutrophils Relative %: 81 %
Platelets: 426 K/uL — ABNORMAL HIGH (ref 150–400)
RBC: 4.68 MIL/uL (ref 3.87–5.11)
RDW: 14.3 % (ref 11.5–15.5)
Smear Review: NORMAL
WBC: 17.1 K/uL — ABNORMAL HIGH (ref 4.0–10.5)
nRBC: 0 % (ref 0.0–0.2)

## 2024-01-25 LAB — LIPASE, BLOOD: Lipase: 14 U/L (ref 11–51)

## 2024-01-25 LAB — LACTIC ACID, PLASMA: Lactic Acid, Venous: 2.1 mmol/L (ref 0.5–1.9)

## 2024-01-25 MED ORDER — IOHEXOL 300 MG/ML  SOLN
75.0000 mL | Freq: Once | INTRAMUSCULAR | Status: AC | PRN
  Administered 2024-01-25: 75 mL via INTRAVENOUS

## 2024-01-25 MED ORDER — MORPHINE SULFATE (PF) 4 MG/ML IV SOLN: 4.0000 mg | Freq: Once | INTRAVENOUS | Status: DC

## 2024-01-25 MED ORDER — LACTATED RINGERS IV BOLUS
1000.0000 mL | Freq: Once | INTRAVENOUS | Status: AC
  Administered 2024-01-25: 1000 mL via INTRAVENOUS

## 2024-01-25 MED ORDER — ONDANSETRON HCL 4 MG/2ML IJ SOLN
4.0000 mg | Freq: Once | INTRAMUSCULAR | Status: AC
  Administered 2024-01-25: 4 mg via INTRAVENOUS
  Filled 2024-01-25: qty 2

## 2024-01-25 MED ORDER — PIPERACILLIN-TAZOBACTAM 3.375 G IVPB 30 MIN
3.3750 g | Freq: Once | INTRAVENOUS | Status: DC
  Filled 2024-01-25 (×2): qty 50

## 2024-01-25 NOTE — ED Notes (Signed)
 Patient made aware of need for Urine Specimen; states she is unable to go, states she will try after IVF.

## 2024-01-25 NOTE — H&P (Incomplete)
 History and Physical    Diana Quinn FMW:[id number] DOB: 1965/07/18 DOA: 01/25/2024  Referring MD/NP/PA:   PCP: Orlean Alan HERO, FNP   Patient coming from:  The patient is coming from home.     Chief Complaint: Abdominal pain, diarrhea  HPI: Diana Quinn is a 58 y.o. female with medical history significant of HTN, HLD, DM, CAD, hypothyroidism, depression with anxiety, bipolar, schizophrenia, CKD stage II, A-fib not on anticoagulants, seizure, who presents with abdominal pain, diarrhea.  Patient states that symptoms started since last night. Her AP is mainly located in the right side of abdomen, crossing lower abdomen, constant, aching, moderate to severe, not aggravated or alleviated by normal factors.  Associated with nausea, diarrhea, no vomiting.  No fever or chills.  Patient states that she has had about 10 times of watery diarrhea so far.  No chest pain, cough, SOB.  No symptoms of UTI.  Per her son at bedside, patient had some confusion earlier, but when I saw patient in ED, patient is alert and orientated x 3.  Moves extremity normally.  Her mental status is so clear that she could tell me all medication names and the dosage.  Data reviewed independently and ED Course: pt was found to have WBC 17.1, worsening renal function, lactic acid 2.1, lipase 14.  Temperature normal, blood pressure 148/119, heart rate 98, RR 18, oximetry 94% on room air.  Patient is admitted to telemetry bed as inpatient.    CT of abdomen/pelvis: 1. Findings consistent with colitis involving the ascending, transverse, and descending colon, without pneumatosis or perforation. 2. Appendiceal wall thickening with caliber up to 9 mm. Findings may be reactive in etiology in the setting of colitis. 3. Chronic-appearing L1 compression fracture with at least 60% vertebral body height loss.   EKG: I have personally reviewed.  Sinus rhythm, QTc 429, LAE, borderline LAD, poor R wave progression, low  voltage.   Review of Systems:   General: no fevers, chills, no body weight gain, has poor appetite, has fatigue HEENT: no blurry vision, hearing changes or sore throat Respiratory: no dyspnea, coughing, wheezing CV: no chest pain, no palpitations GI: Has nausea, diarrhea and abdominal pain. GU: no dysuria, burning on urination, increased urinary frequency, hematuria  Ext: no leg edema Neuro: no unilateral weakness, numbness, or tingling, no vision change or hearing loss Skin: no rash, no skin tear. MSK: No muscle spasm, no deformity, no limitation of range of movement in spin Heme: No easy bruising.  Travel history: No recent long distant travel.   Allergy:  Allergies  Allergen Reactions   Depakote [Divalproex Sodium] Shortness Of Breath   Pregabalin Other (See Comments), Rash and Shortness Of Breath    Other Reaction: falls; decreased coordination  Pt ts that she passes out when she takes this med.   Other Reaction: falls; decreased coordination    Pt ts that she passes out when she takes this med.  Other Reaction: falls; decreased coordination, , Pt ts that she passes out when she takes this med.   Valproic Acid  Other (See Comments) and Shortness Of Breath    Other Reaction: Other reaction  SOB   Levetiracetam Itching and Rash    Past Medical History:  Diagnosis Date   Abdominal pain, acute, right upper quadrant 12/12/2014   Abnormal weight loss 05/10/2014   Acute metabolic encephalopathy 09/23/2022   Allergic reaction 09/17/2016   AMS (altered mental status) 06/12/2023   Anemia    Ankle fracture 09/10/2022  Appetite loss 01/14/2015   Bilateral ankle fractures 09/25/2022   CAD (coronary artery disease) 08/10/2020   Closed fracture of left distal tibia 09/10/2022   Closed torus fracture of upper end of right fibula 04/13/2019   E. coli UTI 09/18/2022   EKG, abnormal 02/27/2013   Elevated lithium  level 05/10/2014   Gastric bypass status for obesity    H/O  unstable angina    History of seizure 09/24/2022   Hx SBO 09/30/2014   Hypoglycemia    Hypokalemia 10/03/2022   Hyponatremia 09/17/2022   MI (myocardial infarction) [facility])    Migraine headache 03/24/2011   Note: Unchanged - sees [facility] neurology, Dr. German  Note: Unchanged - sees [facility] neurolog[facility], Dr. German Deal of this note might be different from the original.  Note: Unchanged - sees [facility] neurolog[facility], Dr. German     Occipital headache (Fourth Area of Pain) (Bilateral) (L>R) 03/31/2018   Orthostatic hypotension    Osteoporosis    PEG tube malfunction (HCC) 01/13/2015   Poisoning by unspecified drugs, medicaments and biological substances, accidental (unintentional), initial encounter 02/27/2013   Polysubstance abuse (HCC) 11/30/2016   SBO (small bowel obstruction) (HCC) 09/23/2014   Sciatic nerve disease    Seizures (HCC)    Skin infection at gastrostomy tube site (HCC) 12/29/2014   Suicidal behavior 02/27/2013   Thyroid  disease    TIA (transient ischemic attack) 04/22/2017    Past Surgical History:  Procedure Laterality Date   ABDOMINAL ADHESION SURGERY     ABDOMINAL HYSTERECTOMY     CHOLECYSTECTOMY     DILATION AND CURETTAGE OF UTERUS     ESOPHAGOGASTRODUODENOSCOPY     GASTRIC BYPASS     GASTRIC BYPASS OPEN     revision   GASTROSTOMY W/ FEEDING TUBE     HERNIA REPAIR     OOPHORECTOMY     SPLENECTOMY     TONSILLECTOMY      Social History:  reports that she has never smoked. She has never used smokeless tobacco. She reports that she does not currently use alcohol. She reports that she does not use drugs.  Family History:  Family History  Problem Relation Age of Onset   Cancer Mother    Hypertension Mother    COPD Mother    Asthma Sister    Psoriasis Sister    Epilepsy Brother      Prior to Admission medications   Medication Sig Start Date End Date Taking? Authorizing Provider  ACCU-CHEK GUIDE TEST test strip TEST BLOOD SUGAR UP TO THREE TIMES DAILY BEFORE  MEALS *NEW PRESCRIPTION REQUEST* 12/15/23   Orlean Alan HERO, FNP  acetaminophen  (TYLENOL ) 325 MG tablet Take 2 tablets (650 mg total) by mouth every 6 (six) hours as needed for mild pain or moderate pain. 09/25/22   Josette Ade, MD  Alcohol Swabs (ALCOHOL PREP) 70 % PADS USE WITH INSULIN  THREE TIMES A DAY AS NEEDED *NEW PRESCRIPTION REQUEST* 12/15/23   Orlean Alan HERO, FNP  apixaban  (ELIQUIS ) 2.5 MG TABS tablet Take 1 tablet (2.5 mg total) by mouth 2 (two) times daily. 06/22/23   Orlean Alan HERO, FNP  atorvastatin  (LIPITOR) 40 MG tablet Take 1 tablet (40 mg total) by mouth daily. 01/12/24   Orlean Alan HERO, FNP  BELSOMRA  20 MG TABS Take 1 tablet by mouth at bedtime as needed. 10/09/22   [provider]  butalbital -apap-caffeine -codeine (FIORICET  WITH CODEINE) 50-325-40-30 MG capsule Take 1 capsule by mouth every 6 (six) hours as needed for headache.  01/12/24   Orlean Alan HERO, FNP  CAPLYTA  42 MG capsule Take 1 capsule (42 mg total) by mouth daily. 05/15/23   Orlean Alan HERO, FNP  cetirizine  (ZYRTEC  ALLERGY) 10 MG tablet Take 1 tablet (10 mg total) by mouth daily. 06/10/23 06/09/24  Orlean Alan HERO, FNP  clonazePAM  (KLONOPIN ) 1 MG tablet Take 1 mg by mouth at bedtime as needed. 09/28/22   [provider]  Continuous Glucose Sensor (FREESTYLE LIBRE 3 SENSOR) MISC Place 1 sensor on the skin every 14 days. Use to check glucose continuously Patient not taking: Reported on 01/12/2024 10/28/22   Orlean Alan HERO, FNP  cyanocobalamin  (VITAMIN B12) 1000 MCG/ML injection Inject 1 mL (1,000 mcg total) into the muscle every 30 (thirty) days. 10/30/23   Scoggins, Amber, NP  Diclofenac  Sodium 3 % GEL Apply 1 Application topically in the morning and at bedtime. 01/12/24   Orlean Alan HERO, FNP  EMBECTA PEN NEEDLE NANO 2 GEN 32G X 4 MM MISC USE WITH INSULIN  THREE TIMES A DAY AS NEEDED *NEW PRESCRIPTION REQUEST* 12/15/23   Orlean Alan HERO, FNP  famotidine  (PEPCID ) 20 MG tablet Take 1 tablet (20 mg  total) by mouth 2 (two) times daily. 06/10/23 06/09/24  Orlean Alan HERO, FNP  gabapentin  (NEURONTIN ) 600 MG tablet Take 1 tablet (600 mg total) by mouth in the morning, at noon, in the evening, and at bedtime. 01/12/24   Orlean Alan HERO, FNP  Galcanezumab-gnlm (EMGALITY) 120 MG/ML SOAJ Inject 1 mL into the skin every 30 (thirty) days. 01/12/24 02/11/24  Orlean Alan HERO, FNP  GVOKE HYPOPEN  2-PACK 1 MG/0.2ML SOAJ Inject 1 mg into the skin as needed (hypoglycemia). 05/15/23   Orlean Alan HERO, FNP  insulin  aspart (NOVOLOG  FLEXPEN) 100 UNIT/ML FlexPen Check blood sugars with meals and administer as follows: BG < 200 - no insulin ; BG 200-249 = 2 units; BG 250-299 = 4 units; BG 300-349 = 6 units; BG 350-399 = 8 units; BG 400+ = 10 units and recheck in 15 minutes. Maximum of 30 units per day. 10/15/23   Orlean Alan HERO, FNP  levothyroxine  (SYNTHROID ) 112 MCG tablet TAKE 1 TABLET BY MOUTH ONCE DAILY BEFORE BREAKFAST 12/09/23   Orlean Alan HERO, FNP  metFORMIN (GLUCOPHAGE) 500 MG tablet Take 500 mg by mouth 2 (two) times daily. Patient not taking: Reported on 01/12/2024 06/09/23   [provider]  Multiple Vitamin (MULTIVITAMIN WITH MINERALS) TABS tablet Take 1 tablet by mouth daily. 09/26/22   Josette Ade, MD  Oxcarbazepine  (TRILEPTAL ) 300 MG tablet Take 2 tablets (600 mg total) by mouth 2 (two) times daily. 01/20/24   Orlean Alan HERO, FNP  pantoprazole  (PROTONIX ) 40 MG tablet Take 1 tablet (40 mg total) by mouth daily. 01/12/24   Orlean Alan HERO, FNP  PARoxetine  (PAXIL ) 40 MG tablet Take 1 tablet (40 mg total) by mouth daily. 05/19/23   Fernand Fredy RAMAN, MD  promethazine (PHENERGAN) 12.5 MG tablet TAKE 1 TABLET BY MOUTH EVERY 6 HOURS AS NEEDED NAUSEA *NEW PRESCRIPTION REQUEST* 12/15/23   Orlean Alan HERO, FNP  QUEtiapine  (SEROQUEL ) 100 MG tablet Take 3 tablets (300 mg total) by mouth at bedtime. 07/06/23   Orlean Alan HERO, FNP  ranolazine  (RANEXA ) 1000 MG SR tablet TAKE ONE (1) TABLET BY MOUTH TWICE  DAILY *NEW PRESCRIPTION REQUEST* 12/15/23   Orlean Alan HERO, FNP  ranolazine  (RANEXA ) 500 MG 12 hr tablet TAKE ONE TABLET BY MOUTH TWICE A DAY Patient not taking: Reported on 01/12/2024 07/20/23   Orlean Alan HERO,  FNP  sucralfate  (CARAFATE ) 1 g tablet Take 1 tablet (1 g total) by mouth 4 (four) times daily. 06/10/23   Orlean Alan HERO, FNP  tiZANidine  (ZANAFLEX ) 4 MG tablet Take 2 tablets (8 mg total) by mouth 3 (three) times daily. 01/12/24   Orlean Alan HERO, FNP  Topiramate  ER (TROKENDI  XR) 200 MG CP24 Take 1 capsule (200 mg total) by mouth daily. 11/17/23   Orlean Alan HERO, FNP  traZODone  (DESYREL ) 150 MG tablet Take 2 tablets (300 mg total) by mouth at bedtime. 07/06/23   Orlean Alan HERO, FNP  TROKENDI  XR 200 MG CP24 Take 1 capsule (200 mg total) by mouth daily. 01/12/24   Orlean Alan HERO, FNP  Ubrogepant  (UBRELVY ) 100 MG TABS Take 1 tablet (100 mg total) by mouth as needed. 01/12/24   Orlean Alan HERO, FNP  Vitamin D , Ergocalciferol , (DRISDOL ) 1.25 MG (50000 UNIT) CAPS capsule TAKE 1 CAPSULE BY MOUTH EVERY WEEK *NEW PRESCRIPTION REQUEST* 12/15/23   Orlean Alan HERO, FNP  vitamin D3 (CHOLECALCIFEROL ) 25 MCG tablet Take 1 tablet (1,000 Units total) by mouth daily. 06/22/23   Orlean Alan HERO, FNP    Physical Exam: Vitals:   01/25/24 2012 01/25/24 2300 01/26/24 0042 01/26/24 0233  BP: (!) 148/119 116/71 99/62 94/71   Pulse: 98 66 99 97  Resp: 18 19  (!) 22  Temp: 98.4 F (36.9 C)     TempSrc: Oral     SpO2: 94% 97% 98% 98%   General: Not in acute distress HEENT:       Eyes: PERRL, EOMI, no jaundice       ENT: No discharge from the ears and nose, no pharynx injection, no tonsillar enlargement.        Neck: No JVD, no bruit, no mass felt. Heme: No neck lymph node enlargement. Cardiac: S1/S2, RRR, No murmurs, No gallops or rubs. Respiratory: No rales, wheezing, rhonchi or rubs. GI: Soft, nondistended, has tenderness in right and lower abdomen, no rebound pain, no organomegaly, BS  present. GU: No hematuria Ext: No pitting leg edema bilaterally. 1+DP/PT pulse bilaterally. Musculoskeletal: No joint deformities, No joint redness or warmth, no limitation of ROM in spin. Skin: No rashes.  Neuro: Alert, oriented X3, cranial nerves II-XII grossly intact, moves all extremities normally. Psych: Patient is not psychotic, no suicidal or hemocidal ideation.  Labs on Admission: I have personally reviewed following labs and imaging studies  CBC: Recent Labs  Lab 01/25/24 2033  WBC 17.1*  NEUTROABS 13.9*  HGB 14.6  HCT 45.3  MCV 96.8  PLT 426*   Basic Metabolic Panel: Recent Labs  Lab 01/25/24 2033 01/26/24 0157  NA 132*  --   K 5.3* 5.0  CL 100  --   CO2 18*  --   GLUCOSE 205*  --   BUN 23*  --   CREATININE 1.69*  --   CALCIUM  8.6*  --    GFR: Estimated Creatinine Clearance: 39.8 mL/min (A) (by C-G formula based on SCr of 1.69 mg/dL (H)). Liver Function Tests: Recent Labs  Lab 01/25/24 2033  AST 51*  ALT 36  ALKPHOS 168*  BILITOT 0.4  PROT 6.5  ALBUMIN 3.7   Recent Labs  Lab 01/25/24 2033  LIPASE 14   No results for input(s): AMMONIA in the last 168 hours. Coagulation Profile: Recent Labs  Lab 01/26/24 0157  INR 1.1   Cardiac Enzymes: No results for input(s): CKTOTAL, CKMB, CKMBINDEX, TROPONINI in the last 168 hours. BNP (last 3 results) No results  for input(s): PROBNP in the last 8760 hours. HbA1C: No results for input(s): HGBA1C in the last 72 hours. CBG: No results for input(s): GLUCAP in the last 168 hours. Lipid Profile: No results for input(s): CHOL, HDL, LDLCALC, TRIG, CHOLHDL, LDLDIRECT in the last 72 hours. Thyroid  Function Tests: No results for input(s): TSH, T4TOTAL, FREET4, T3FREE, THYROIDAB in the last 72 hours. Anemia Panel: No results for input(s): VITAMINB12, FOLATE, FERRITIN, TIBC, IRON, RETICCTPCT in the last 72 hours. Urine analysis:    Component Value Date/Time    COLORURINE YELLOW (A) 06/12/2023 1028   APPEARANCEUR CLEAR (A) 06/12/2023 1028   APPEARANCEUR Clear 09/30/2011 1945   LABSPEC 1.029 06/12/2023 1028   LABSPEC 1.018 09/30/2011 1945   PHURINE 6.0 06/12/2023 1028   GLUCOSEU NEGATIVE 06/12/2023 1028   GLUCOSEU Negative 09/30/2011 1945   HGBUR NEGATIVE 06/12/2023 1028   BILIRUBINUR Moderate 01/12/2024 1608   BILIRUBINUR Negative 09/30/2011 1945   KETONESUR NEGATIVE 06/12/2023 1028   PROTEINUR Positive (A) 01/12/2024 1608   PROTEINUR NEGATIVE 06/12/2023 1028   UROBILINOGEN 0.2 01/12/2024 1608   NITRITE Negative 01/12/2024 1608   NITRITE NEGATIVE 06/12/2023 1028   LEUKOCYTESUR Small (1+) (A) 01/12/2024 1608   LEUKOCYTESUR NEGATIVE 06/12/2023 1028   LEUKOCYTESUR Negative 09/30/2011 1945   Sepsis Labs: @LABRCNTIP (procalcitonin:4,lacticidven:4) )No results found for this or any previous visit (from the past 240 hours).   Radiological Exams on Admission:   Assessment/Plan Principal Problem:   Acute colitis Active Problems:   CAD (coronary artery disease)   Atrial fibrillation, chronic (HCC)   Seizure (HCC)   Hypothyroidism   Essential hypertension   Hyperkalemia   HLD (hyperlipidemia)   Schizophrenia (HCC)   Depression with anxiety   Bipolar disorder (HCC)   Assessment and Plan:  Acute colitis:  CT scan showed colitis involving the ascending, transverse, and descending colon, without pneumatosis or perforation. CT also showed appendiceal wall thickening with caliber up to 9 mm.  ED physician discussed with general surgeon on-call (Dr. Shearon is on-call), no surgery needed.  Possibly reactive in etiology in the setting of colitis.  Has leukocytosis with WBC 17.1 but no fever.  Lactic acid 2.1.  -Admit to telemetry bed as inpatient - Pain control: As needed Dilaudid , oxycodone , Tylenol  -As needed Zofran  - IV Zosyn  - Blood culture - IV fluid: 2 L LR, Naima 75 cc/h - trend Lactic acid level - check C. difficile and GI  pathogen panel   CAD (coronary artery disease): no chest pain -Lipitor, Ranexa   Atrial fibrillation, chronic [facility]): Patient denies this diagnosis.  Patient is not taking anticoagulants.  Heart rate 98 -Telemetry monitoring  Seizure (HCC) -Topamax  and Trileptal  - Seizure precaution - As needed Ativan  for seizure  Hypothyroidism -Synthroid   Essential hypertension: Patient is not taking medications. -IV hydralazine as needed  Hyperkalemia: Potassium 5.3 with hemolysis. -Repeat potassium level - On IV fluid  HLD (hyperlipidemia) -Lipitor  Schizophrenia (HCC), Depression with anxiety and Bipolar disorder (HCC): - Continue home medications: Paxil , prn clonazepam , trazodone , Seroquel , Caplyta        DVT ppx: SQ Heparin      Code Status: Full code   Family Communication:   Yes, patient's son and daughter-in-law at bed side.    Disposition Plan:  Anticipate discharge back to previous environment  Consults called: ED physician discussed with general surgeon  Admission status and Level of care: Telemetry: as inpt        Dispo: The patient is from: Home  Anticipated d/c is to: Home              Anticipated d/c date is: 2 days              Patient currently is not medically stable to d/c.    Severity of Illness:  The appropriate patient status for this patient is INPATIENT. Inpatient status is judged to be reasonable and necessary in order to provide the required intensity of service to ensure the patient's safety. The patient's presenting symptoms, physical exam findings, and initial radiographic and laboratory data in the context of their chronic comorbidities is felt to place them at high risk for further clinical deterioration. Furthermore, it is not anticipated that the patient will be medically stable for discharge from the hospital within 2 midnights of admission.   * I certify that at the point of admission it is my clinical judgment that the patient  will require inpatient hospital care spanning beyond 2 midnights from the point of admission due to high intensity of service, high risk for further deterioration and high frequency of surveillance required.*         Date of Service 01/26/2024    Demontray [facility]   If 7PM-7AM, please contact night-coverage [email or web address] 01/26/2024, 2:53 AM

## 2024-01-25 NOTE — ED Notes (Signed)
 Lab at bedside

## 2024-01-25 NOTE — ED Notes (Addendum)
 EDP, Bradler made aware of altered mental status; per EMS report, family has recognized the same.  No new orders at this time.

## 2024-01-25 NOTE — ED Triage Notes (Addendum)
 Pt arrives EMS from home c/o abd pain w/ diarrhea since last night. EMS stated pt's daughter-in-law on scene reports AMS and repeating herself since last night. Per ems last time this happened pt had over medicated, she gets her medications mixed up;a&oriented x4 upon arrival to ER

## 2024-01-25 NOTE — ED Provider Notes (Signed)
 [facility] Provider Note   Event Date/Time   First MD Initiated Contact with Patient 01/25/24 2005     (approximate) History  Abdominal Pain  HPI Diana Quinn is a 58 y.o. female with a stated past medical history of gastric bypass, seizures, SBO, migraines, thyroid  disease, TIA, CAD, and polysubstance abuse who presents complaining of generalized abdominal pain with associated diarrhea that began last night.  EMS also notes patient is altered from her baseline including repeating herself.  EMS states that they have seen patient before with similar symptoms when she has gotten her medications mixed up.  Patient denies any medication nonadherence.  Patient endorses pain radiating from left to right in her abdomen that is associated with diarrhea that began yesterday.  Patient denies any recent travel, sick contacts, or food out of the ordinary.  Patient endorses nausea without vomiting.  Patient states she has had no p.o. intake today ROS: Patient currently denies any vision changes, tinnitus, difficulty speaking, facial droop, sore throat, chest pain, shortness of breath, dysuria, or weakness/numbness/paresthesias in any extremity   Physical Exam  Triage Vital Signs: ED Triage Vitals  Encounter Vitals Group     BP      Girls Systolic BP Percentile      Girls Diastolic BP Percentile      Boys Systolic BP Percentile      Boys Diastolic BP Percentile      Pulse      Resp      Temp      Temp src      SpO2      Weight      Height      Head Circumference      Peak Flow      Pain Score      Pain Loc      Pain Education      Exclude from Growth Chart    Most recent vital signs: There were no vitals filed for this visit. General: Awake, oriented x4. CV:  Good peripheral perfusion. Resp:  Normal effort. Abd:  No distention.  Generalized tenderness to palpation Other:  Morbidly obese middle-aged Caucasian female resting comfortably in no acute  distress ED Results / Procedures / Treatments  Labs (all labs ordered are listed, but only abnormal results are displayed) Labs Reviewed - No data to display RADIOLOGY ED MD interpretation: Pending - All radiology independently interpreted and agree with radiology assessment Official radiology report(s): No results found. PROCEDURES: Critical Care performed: No Procedures MEDICATIONS ORDERED IN ED: Medications - No data to display IMPRESSION / MDM / ASSESSMENT AND PLAN / ED COURSE  I reviewed the triage vital signs and the nursing notes.                             The patient is on the cardiac monitor to evaluate for evidence of arrhythmia and/or significant heart rate changes. Patient's presentation is most consistent with acute presentation with potential threat to life or bodily function. Patient is a 58 year old female with the above-stated past medical history that presents complaining of diarrhea with generalized abdominal pain that began yesterday DDx: Diverticulitis, urosepsis, medication overdose, dehydration Plan: CBC, CMP, lactic acid, UA, lipase, UDS  Laboratory evaluation significant for leukocytosis to 17 with a left shift as well as AKI with creatinine 1.69 and per kalemia to 5.3.  Given patient's abdominal pain and leukocytosis, will order CT of the  abdomen pelvis for further evaluation  Care of this patient will be signed out to the oncoming physician at the end of my shift.  All pertinent patient information conveyed and all questions answered.  All further care and disposition decisions will be made by the oncoming physician. Clinical Course as of 01/26/24 1930  Mon Jan 25, 2024  2329 S/o from Dr. Jossie: - 44F w/ diarrhea, colitis, ?appendicitis - gen surg aware, no plan for surgical intervention at this time, will continue to monitor clinically  Hospitalist paged [MM]  Tue Jan 26, 2024  0005 Presented to hospitalist for admission [MM]    Clinical Course  User Index [MM] Clarine Ozell LABOR, MD   FINAL CLINICAL IMPRESSION(S) / ED DIAGNOSES   Final diagnoses:  None   Rx / DC Orders   ED Discharge Orders     None      Note:  This document was prepared using Dragon voice recognition software and may include unintentional dictation errors.   Anael Rosch K, MD 01/26/24 [phone]

## 2024-01-26 ENCOUNTER — Other Ambulatory Visit: Payer: Self-pay

## 2024-01-26 ENCOUNTER — Encounter: Payer: Self-pay | Admitting: Internal Medicine

## 2024-01-26 DIAGNOSIS — E871 Hypo-osmolality and hyponatremia: Secondary | ICD-10-CM | POA: Diagnosis present

## 2024-01-26 DIAGNOSIS — R569 Unspecified convulsions: Secondary | ICD-10-CM

## 2024-01-26 DIAGNOSIS — G929 Unspecified toxic encephalopathy: Secondary | ICD-10-CM | POA: Diagnosis not present

## 2024-01-26 DIAGNOSIS — K529 Noninfective gastroenteritis and colitis, unspecified: Principal | ICD-10-CM | POA: Diagnosis present

## 2024-01-26 DIAGNOSIS — E66812 Obesity, class 2: Secondary | ICD-10-CM | POA: Diagnosis present

## 2024-01-26 DIAGNOSIS — K5289 Other specified noninfective gastroenteritis and colitis: Secondary | ICD-10-CM | POA: Diagnosis present

## 2024-01-26 DIAGNOSIS — Z7901 Long term (current) use of anticoagulants: Secondary | ICD-10-CM | POA: Diagnosis not present

## 2024-01-26 DIAGNOSIS — E875 Hyperkalemia: Secondary | ICD-10-CM | POA: Diagnosis present

## 2024-01-26 DIAGNOSIS — F419 Anxiety disorder, unspecified: Secondary | ICD-10-CM | POA: Diagnosis present

## 2024-01-26 DIAGNOSIS — I129 Hypertensive chronic kidney disease with stage 1 through stage 4 chronic kidney disease, or unspecified chronic kidney disease: Secondary | ICD-10-CM | POA: Diagnosis present

## 2024-01-26 DIAGNOSIS — G9341 Metabolic encephalopathy: Secondary | ICD-10-CM | POA: Diagnosis not present

## 2024-01-26 DIAGNOSIS — F418 Other specified anxiety disorders: Secondary | ICD-10-CM | POA: Diagnosis not present

## 2024-01-26 DIAGNOSIS — R197 Diarrhea, unspecified: Secondary | ICD-10-CM | POA: Diagnosis present

## 2024-01-26 DIAGNOSIS — I2510 Atherosclerotic heart disease of native coronary artery without angina pectoris: Secondary | ICD-10-CM | POA: Diagnosis present

## 2024-01-26 DIAGNOSIS — E119 Type 2 diabetes mellitus without complications: Secondary | ICD-10-CM | POA: Diagnosis not present

## 2024-01-26 DIAGNOSIS — I482 Chronic atrial fibrillation, unspecified: Secondary | ICD-10-CM | POA: Diagnosis present

## 2024-01-26 DIAGNOSIS — E1165 Type 2 diabetes mellitus with hyperglycemia: Secondary | ICD-10-CM | POA: Diagnosis present

## 2024-01-26 DIAGNOSIS — N179 Acute kidney failure, unspecified: Secondary | ICD-10-CM | POA: Diagnosis present

## 2024-01-26 DIAGNOSIS — E860 Dehydration: Secondary | ICD-10-CM | POA: Diagnosis present

## 2024-01-26 DIAGNOSIS — E785 Hyperlipidemia, unspecified: Secondary | ICD-10-CM | POA: Diagnosis present

## 2024-01-26 DIAGNOSIS — F259 Schizoaffective disorder, unspecified: Secondary | ICD-10-CM | POA: Diagnosis present

## 2024-01-26 DIAGNOSIS — G928 Other toxic encephalopathy: Secondary | ICD-10-CM | POA: Diagnosis present

## 2024-01-26 DIAGNOSIS — R4182 Altered mental status, unspecified: Secondary | ICD-10-CM | POA: Diagnosis not present

## 2024-01-26 DIAGNOSIS — K633 Ulcer of intestine: Secondary | ICD-10-CM | POA: Diagnosis present

## 2024-01-26 DIAGNOSIS — F319 Bipolar disorder, unspecified: Secondary | ICD-10-CM | POA: Diagnosis not present

## 2024-01-26 DIAGNOSIS — D75839 Thrombocytosis, unspecified: Secondary | ICD-10-CM | POA: Diagnosis present

## 2024-01-26 DIAGNOSIS — E872 Acidosis, unspecified: Secondary | ICD-10-CM | POA: Diagnosis present

## 2024-01-26 DIAGNOSIS — E039 Hypothyroidism, unspecified: Secondary | ICD-10-CM | POA: Diagnosis present

## 2024-01-26 DIAGNOSIS — L03311 Cellulitis of abdominal wall: Secondary | ICD-10-CM | POA: Diagnosis present

## 2024-01-26 DIAGNOSIS — T426X4A Poisoning by other antiepileptic and sedative-hypnotic drugs, undetermined, initial encounter: Secondary | ICD-10-CM | POA: Diagnosis not present

## 2024-01-26 DIAGNOSIS — I10 Essential (primary) hypertension: Secondary | ICD-10-CM | POA: Diagnosis not present

## 2024-01-26 DIAGNOSIS — Z794 Long term (current) use of insulin: Secondary | ICD-10-CM | POA: Diagnosis not present

## 2024-01-26 DIAGNOSIS — E1122 Type 2 diabetes mellitus with diabetic chronic kidney disease: Secondary | ICD-10-CM | POA: Diagnosis present

## 2024-01-26 DIAGNOSIS — N182 Chronic kidney disease, stage 2 (mild): Secondary | ICD-10-CM | POA: Diagnosis present

## 2024-01-26 LAB — POTASSIUM: Potassium: 5 mmol/L (ref 3.5–5.1)

## 2024-01-26 LAB — BASIC METABOLIC PANEL WITH GFR
Anion gap: 13 (ref 5–15)
BUN: 28 mg/dL — ABNORMAL HIGH (ref 6–20)
CO2: 18 mmol/L — ABNORMAL LOW (ref 22–32)
Calcium: 7.9 mg/dL — ABNORMAL LOW (ref 8.9–10.3)
Chloride: 103 mmol/L (ref 98–111)
Creatinine, Ser: 1.78 mg/dL — ABNORMAL HIGH (ref 0.44–1.00)
GFR, Estimated: 33 mL/min — ABNORMAL LOW (ref >60)
Glucose, Bld: 154 mg/dL — ABNORMAL HIGH (ref 70–99)
Potassium: 5.2 mmol/L — ABNORMAL HIGH (ref 3.5–5.1)
Sodium: 134 mmol/L — ABNORMAL LOW (ref 135–145)

## 2024-01-26 LAB — URINALYSIS, W/ REFLEX TO CULTURE (INFECTION SUSPECTED)
Bacteria, UA: NONE SEEN
Glucose, UA: NEGATIVE mg/dL
Hgb urine dipstick: NEGATIVE
Ketones, ur: NEGATIVE mg/dL
Leukocytes,Ua: NEGATIVE
Nitrite: NEGATIVE
Protein, ur: 30 mg/dL — AB
Specific Gravity, Urine: >1.046 — ABNORMAL HIGH (ref 1.005–1.030)
pH: 5 (ref 5.0–8.0)

## 2024-01-26 LAB — GLUCOSE, CAPILLARY
Glucose-Capillary: 142 mg/dL — ABNORMAL HIGH (ref 70–99)
Glucose-Capillary: 146 mg/dL — ABNORMAL HIGH (ref 70–99)
Glucose-Capillary: 135 mg/dL — ABNORMAL HIGH (ref 70–99)
Glucose-Capillary: 142 mg/dL — ABNORMAL HIGH (ref 70–99)
Glucose-Capillary: 197 mg/dL — ABNORMAL HIGH (ref 70–99)

## 2024-01-26 LAB — LACTIC ACID, PLASMA
Lactic Acid, Venous: 2 mmol/L (ref 0.5–1.9)
Lactic Acid, Venous: 1.9 mmol/L (ref 0.5–1.9)

## 2024-01-26 LAB — CBC
HCT: 42.5 % (ref 36.0–46.0)
Hemoglobin: 13.6 g/dL (ref 12.0–15.0)
MCH: 30.7 pg (ref 26.0–34.0)
MCHC: 32 g/dL (ref 30.0–36.0)
MCV: 95.9 fL (ref 80.0–100.0)
Platelets: 309 K/uL (ref 150–400)
RBC: 4.43 MIL/uL (ref 3.87–5.11)
RDW: 14.4 % (ref 11.5–15.5)
WBC: 9.6 K/uL (ref 4.0–10.5)
nRBC: 0 % (ref 0.0–0.2)

## 2024-01-26 LAB — URINE DRUG SCREEN
Amphetamines: NEGATIVE
Barbiturates: NEGATIVE
Benzodiazepines: NEGATIVE
Cocaine: NEGATIVE
Fentanyl: NEGATIVE
Methadone Scn, Ur: NEGATIVE
Opiates: NEGATIVE
Tetrahydrocannabinol: NEGATIVE

## 2024-01-26 LAB — PROTIME-INR
INR: 1.1 (ref 0.8–1.2)
Prothrombin Time: 14.3 s (ref 11.4–15.2)

## 2024-01-26 LAB — APTT: aPTT: 26 s (ref 24–36)

## 2024-01-26 LAB — C DIFFICILE QUICK SCREEN W PCR REFLEX
C Diff antigen: NEGATIVE
C Diff interpretation: NOT DETECTED
C Diff toxin: NEGATIVE

## 2024-01-26 MED ORDER — BUTALBITAL-APAP-CAFFEINE 50-325-40 MG PO TABS
1.0000 | ORAL_TABLET | Freq: Four times a day (QID) | ORAL | Status: DC | PRN
  Administered 2024-02-03 – 2024-02-05 (×3): 1 via ORAL
  Filled 2024-01-26 (×4): qty 1

## 2024-01-26 MED ORDER — HYDROMORPHONE HCL 1 MG/ML IJ SOLN
1.0000 mg | INTRAMUSCULAR | Status: DC | PRN
  Administered 2024-01-26 (×3): 1 mg via INTRAVENOUS
  Filled 2024-01-26 (×3): qty 1

## 2024-01-26 MED ORDER — PAROXETINE HCL 20 MG PO TABS
40.0000 mg | ORAL_TABLET | Freq: Every day | ORAL | Status: DC
  Administered 2024-01-26 – 2024-01-27 (×2): 40 mg via ORAL
  Filled 2024-01-26 (×3): qty 2

## 2024-01-26 MED ORDER — HEPARIN SODIUM (PORCINE) 5000 UNIT/ML IJ SOLN
5000.0000 [IU] | Freq: Three times a day (TID) | INTRAMUSCULAR | Status: DC
  Administered 2024-01-26 – 2024-01-28 (×7): 5000 [IU] via SUBCUTANEOUS
  Filled 2024-01-26 (×7): qty 1

## 2024-01-26 MED ORDER — GABAPENTIN 300 MG PO CAPS
600.0000 mg | ORAL_CAPSULE | Freq: Two times a day (BID) | ORAL | Status: DC
  Administered 2024-01-26 – 2024-01-27 (×4): 600 mg via ORAL
  Filled 2024-01-26 (×4): qty 2

## 2024-01-26 MED ORDER — LEVOTHYROXINE SODIUM 112 MCG PO TABS
112.0000 ug | ORAL_TABLET | Freq: Every day | ORAL | Status: DC
  Administered 2024-01-26 – 2024-02-05 (×11): 112 ug via ORAL
  Filled 2024-01-26 (×12): qty 1

## 2024-01-26 MED ORDER — CLONAZEPAM 1 MG PO TABS
1.0000 mg | ORAL_TABLET | Freq: Every evening | ORAL | Status: DC | PRN
  Administered 2024-01-29 – 2024-02-04 (×4): 1 mg via ORAL
  Filled 2024-01-26 (×4): qty 1

## 2024-01-26 MED ORDER — PANTOPRAZOLE SODIUM 40 MG PO TBEC
40.0000 mg | DELAYED_RELEASE_TABLET | Freq: Every day | ORAL | Status: DC
  Administered 2024-01-26 – 2024-02-05 (×11): 40 mg via ORAL
  Filled 2024-01-26 (×12): qty 1

## 2024-01-26 MED ORDER — INSULIN ASPART 100 UNIT/ML IJ SOLN: 0.0000 [IU] | Freq: Every day | INTRAMUSCULAR | Status: DC

## 2024-01-26 MED ORDER — LACTATED RINGERS IV BOLUS
1000.0000 mL | Freq: Once | INTRAVENOUS | Status: AC
  Administered 2024-01-26: 1000 mL via INTRAVENOUS

## 2024-01-26 MED ORDER — SUVOREXANT 20 MG PO TABS: 1.0000 | ORAL_TABLET | Freq: Every evening | ORAL | Status: DC | PRN

## 2024-01-26 MED ORDER — QUETIAPINE FUMARATE 300 MG PO TABS
300.0000 mg | ORAL_TABLET | Freq: Every day | ORAL | Status: DC
  Administered 2024-01-26 – 2024-01-27 (×2): 300 mg via ORAL
  Filled 2024-01-26 (×3): qty 1

## 2024-01-26 MED ORDER — INSULIN ASPART 100 UNIT/ML IJ SOLN
0.0000 [IU] | Freq: Three times a day (TID) | INTRAMUSCULAR | Status: DC
  Administered 2024-01-26 – 2024-01-30 (×6): 1 [IU] via SUBCUTANEOUS
  Administered 2024-01-31: 2 [IU] via SUBCUTANEOUS
  Administered 2024-02-02: 1 [IU] via SUBCUTANEOUS
  Administered 2024-02-03: 2 [IU] via SUBCUTANEOUS
  Filled 2024-01-26 (×2): qty 1
  Filled 2024-01-26: qty 2
  Filled 2024-01-26 (×6): qty 1

## 2024-01-26 MED ORDER — LACTATED RINGERS IV SOLN: INTRAVENOUS | Status: DC

## 2024-01-26 MED ORDER — OXYCODONE HCL 5 MG PO TABS
5.0000 mg | ORAL_TABLET | Freq: Four times a day (QID) | ORAL | Status: DC | PRN
  Administered 2024-01-26 – 2024-02-01 (×12): 5 mg via ORAL
  Filled 2024-01-26 (×12): qty 1

## 2024-01-26 MED ORDER — CYANOCOBALAMIN 1000 MCG/ML IJ SOLN: 1000.0000 ug | INTRAMUSCULAR | Status: DC

## 2024-01-26 MED ORDER — TRAZODONE HCL 100 MG PO TABS
300.0000 mg | ORAL_TABLET | Freq: Every day | ORAL | Status: DC
  Administered 2024-01-26 – 2024-01-27 (×2): 300 mg via ORAL
  Filled 2024-01-26 (×3): qty 3

## 2024-01-26 MED ORDER — SODIUM ZIRCONIUM CYCLOSILICATE 10 G PO PACK
10.0000 g | PACK | Freq: Once | ORAL | Status: AC
  Administered 2024-01-26: 10 g via ORAL
  Filled 2024-01-26: qty 1

## 2024-01-26 MED ORDER — PIPERACILLIN-TAZOBACTAM 3.375 G IVPB
3.3750 g | Freq: Three times a day (TID) | INTRAVENOUS | Status: DC
  Administered 2024-01-26 – 2024-01-31 (×19): 3.375 g via INTRAVENOUS
  Filled 2024-01-26 (×18): qty 50

## 2024-01-26 MED ORDER — ZOLPIDEM TARTRATE 5 MG PO TABS
5.0000 mg | ORAL_TABLET | Freq: Every evening | ORAL | Status: DC | PRN
  Administered 2024-01-31 – 2024-02-04 (×5): 5 mg via ORAL
  Filled 2024-01-26 (×5): qty 1

## 2024-01-26 MED ORDER — SUMATRIPTAN SUCCINATE 50 MG PO TABS: 50.0000 mg | ORAL_TABLET | ORAL | Status: DC | PRN

## 2024-01-26 MED ORDER — TOPIRAMATE ER 200 MG PO CAP24: 1.0000 | ORAL_CAPSULE | Freq: Every day | ORAL | Status: DC

## 2024-01-26 MED ORDER — LUMATEPERONE TOSYLATE 42 MG PO CAPS
42.0000 mg | ORAL_CAPSULE | Freq: Every day | ORAL | Status: DC
  Administered 2024-01-26 – 2024-02-05 (×11): 42 mg via ORAL
  Filled 2024-01-26 (×12): qty 1

## 2024-01-26 MED ORDER — UBROGEPANT 100 MG PO TABS: 100.0000 mg | ORAL_TABLET | ORAL | Status: DC | PRN

## 2024-01-26 MED ORDER — OXCARBAZEPINE 300 MG PO TABS
600.0000 mg | ORAL_TABLET | Freq: Two times a day (BID) | ORAL | Status: DC
  Administered 2024-01-26 – 2024-02-05 (×21): 600 mg via ORAL
  Filled 2024-01-26 (×23): qty 2

## 2024-01-26 MED ORDER — ONDANSETRON HCL 4 MG/2ML IJ SOLN
4.0000 mg | Freq: Three times a day (TID) | INTRAMUSCULAR | Status: DC | PRN
  Administered 2024-02-01 – 2024-02-05 (×2): 4 mg via INTRAVENOUS
  Filled 2024-01-26 (×2): qty 2

## 2024-01-26 MED ORDER — ADULT MULTIVITAMIN W/MINERALS CH
1.0000 | ORAL_TABLET | Freq: Every day | ORAL | Status: DC
  Administered 2024-01-26 – 2024-02-05 (×10): 1 via ORAL
  Filled 2024-01-26 (×11): qty 1

## 2024-01-26 MED ORDER — TOPIRAMATE 100 MG PO TABS
100.0000 mg | ORAL_TABLET | Freq: Two times a day (BID) | ORAL | Status: DC
  Administered 2024-01-26 – 2024-02-05 (×21): 100 mg via ORAL
  Filled 2024-01-26 (×22): qty 1

## 2024-01-26 MED ORDER — ATORVASTATIN CALCIUM 20 MG PO TABS
40.0000 mg | ORAL_TABLET | Freq: Every day | ORAL | Status: DC
  Administered 2024-01-26 – 2024-02-05 (×11): 40 mg via ORAL
  Filled 2024-01-26 (×11): qty 2

## 2024-01-26 MED ORDER — LORAZEPAM 2 MG/ML IJ SOLN
2.0000 mg | INTRAMUSCULAR | Status: DC | PRN
  Administered 2024-01-28: 2 mg via INTRAVENOUS
  Filled 2024-01-26: qty 1

## 2024-01-26 MED ORDER — ACETAMINOPHEN 325 MG PO TABS
650.0000 mg | ORAL_TABLET | Freq: Four times a day (QID) | ORAL | Status: DC | PRN
  Administered 2024-01-27 – 2024-02-03 (×5): 650 mg via ORAL
  Filled 2024-01-26 (×5): qty 2

## 2024-01-26 MED ORDER — MORPHINE SULFATE (PF) 2 MG/ML IV SOLN: 2.0000 mg | INTRAVENOUS | Status: DC | PRN

## 2024-01-26 MED ORDER — RANOLAZINE ER 500 MG PO TB12
1000.0000 mg | ORAL_TABLET | Freq: Two times a day (BID) | ORAL | Status: DC
  Administered 2024-01-26 – 2024-02-05 (×21): 1000 mg via ORAL
  Filled 2024-01-26 (×24): qty 2

## 2024-01-26 MED ORDER — HYDRALAZINE HCL 20 MG/ML IJ SOLN: 10.0000 mg | INTRAMUSCULAR | Status: DC | PRN

## 2024-01-26 NOTE — Assessment & Plan Note (Signed)
Received IV insulin in the ED Sliding scale insulin coverage

## 2024-01-26 NOTE — Assessment & Plan Note (Signed)
 Potassium of 5.2. - Getting some IV fluid -Ordered for Lokelma  -Continue to monitor

## 2024-01-26 NOTE — Consult Note (Signed)
 [facility] SURGICAL CONSULTATION NOTE (initial) - cpt: 00756   HISTORY OF PRESENT ILLNESS (HPI):  58 y.o. female presented to [facility] ED yesterday for evaluation of abdominal pain. Patient reports she noticed diarrhea over the course of the preceding 5 days or so which she reports is not unusual noting a history of IBS. However, she reports Sunday night she had increase in initially generalized abdominal pain and distension. Ultimately abdominal pain seem to get worse on the right and periumbilically. She denied any associated fever, chills, nausea, emesis, or urinary changes. Bowel movements were non-bloody in nature. No recent travel nor abnormal food/water exposure. She does have a fairly extensive surgical history including gastric bypass, hysterectomy, splenectomy, previous gastrostomy tube placement, lysis of adhesion, and hernia repair. Last colonoscopy in 2021 (Dr Murriel - [facility]) reviewed and noted to be normal. 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 Additionally, she has Eliquis  listed in her medication list but is not on this; Previously needed after bilateral leg fractures. Work up in the ED revealed a leukocytosis to 17.1K (normalized this AM to 9.6K), Hgb to 14.6, sCr - 1.69, hyperkalemia to 5.3, initial venous lactate 2.1 (now 2.0). CT Abdomen/Pelvis with changes consistent with colitis and reactive appendix dilation as well likely secondary to colitis, no perforation nor pneumatosis. She was admit to the hospitalist service. Currently on Zosyn . CLD.   Surgery is consulted by emergency medicine physician Dr. Ozell Klein, MD in this context for evaluation and management of colitis.  PAST MEDICAL HISTORY (PMH):  Past Medical History:  Diagnosis Date   Abdominal pain, acute, right upper quadrant 12/12/2014   Abnormal weight loss 05/10/2014   Acute metabolic encephalopathy 09/23/2022   Allergic reaction  09/17/2016   AMS (altered mental status) 06/12/2023   Anemia    Ankle fracture 09/10/2022   Appetite loss 01/14/2015   Bilateral ankle fractures 09/25/2022   CAD (coronary artery disease) 08/10/2020   Closed fracture of left distal tibia 09/10/2022   Closed torus fracture of upper end of right fibula 04/13/2019   E. coli UTI 09/18/2022   EKG, abnormal 02/27/2013   Elevated lithium  level 05/10/2014   Gastric bypass status for obesity    H/O unstable angina    History of seizure 09/24/2022   Hx SBO 09/30/2014   Hypoglycemia    Hypokalemia 10/03/2022   Hyponatremia 09/17/2022   MI (myocardial infarction) [facility])    Migraine headache 03/24/2011   Note: Unchanged - sees [facility] neurology, Dr. German  Note: Unchanged - sees [facility] neurology, Dr. German Deal of this note might be different from the original.  Note: Unchanged - sees [facility] neurology, Dr. German     Occipital headache (Fourth Area of Pain) (Bilateral) (L>R) 03/31/2018   Orthostatic hypotension    Osteoporosis    PEG tube malfunction (HCC) 01/13/2015   Poisoning by unspecified drugs, medicaments and biological substances, accidental (unintentional), initial encounter 02/27/2013   Polysubstance abuse (HCC) 11/30/2016   SBO (small bowel obstruction) (HCC) 09/23/2014   Sciatic nerve disease    Seizures (HCC)    Skin infection at gastrostomy tube site (HCC) 12/29/2014   Suicidal behavior 02/27/2013   Thyroid  disease    TIA (transient ischemic attack) 04/22/2017     PAST SURGICAL HISTORY (PSH):  Past Surgical History:  Procedure Laterality Date   ABDOMINAL ADHESION SURGERY     ABDOMINAL HYSTERECTOMY     CHOLECYSTECTOMY  DILATION AND CURETTAGE OF UTERUS     ESOPHAGOGASTRODUODENOSCOPY     GASTRIC BYPASS     GASTRIC BYPASS OPEN     revision   GASTROSTOMY W/ FEEDING TUBE     HERNIA REPAIR     OOPHORECTOMY     SPLENECTOMY     TONSILLECTOMY       MEDICATIONS:  Prior to Admission medications   Medication Sig  Start Date End Date Taking? Authorizing Provider  acetaminophen  (TYLENOL ) 325 MG tablet Take 2 tablets (650 mg total) by mouth every 6 (six) hours as needed for mild pain or moderate pain. 09/25/22  Yes Wieting, Richard, MD  apixaban  (ELIQUIS ) 2.5 MG TABS tablet Take 1 tablet (2.5 mg total) by mouth 2 (two) times daily. 06/22/23  Yes Orlean Alan HERO, FNP  atorvastatin  (LIPITOR) 40 MG tablet Take 1 tablet (40 mg total) by mouth daily. 01/12/24  Yes Orlean Alan HERO, FNP  BELSOMRA  20 MG TABS Take 1 tablet by mouth at bedtime as needed. 10/09/22  Yes [provider]  butalbital -apap-caffeine -codeine (FIORICET  WITH CODEINE) 50-325-40-30 MG capsule Take 1 capsule by mouth every 6 (six) hours as needed for headache. 01/12/24  Yes Orlean Alan HERO, FNP  CAPLYTA  42 MG capsule Take 1 capsule (42 mg total) by mouth daily. 05/15/23  Yes Orlean Alan HERO, FNP  cetirizine  (ZYRTEC  ALLERGY) 10 MG tablet Take 1 tablet (10 mg total) by mouth daily. 06/10/23 06/09/24 Yes Orlean Alan HERO, FNP  clonazePAM  (KLONOPIN ) 1 MG tablet Take 1 mg by mouth at bedtime as needed. 09/28/22  Yes [provider]  Diclofenac  Sodium 3 % GEL Apply 1 Application topically in the morning and at bedtime. 01/12/24  Yes Orlean Alan HERO, FNP  gabapentin  (NEURONTIN ) 600 MG tablet Take 1 tablet (600 mg total) by mouth in the morning, at noon, in the evening, and at bedtime. 01/12/24  Yes Orlean Alan HERO, FNP  Galcanezumab-gnlm (EMGALITY) 120 MG/ML SOAJ Inject 1 mL into the skin every 30 (thirty) days. 01/12/24 02/11/24 Yes Orlean Alan HERO, FNP  GVOKE HYPOPEN  2-PACK 1 MG/0.2ML SOAJ Inject 1 mg into the skin as needed (hypoglycemia). 05/15/23  Yes Orlean Alan HERO, FNP  insulin  aspart (NOVOLOG  FLEXPEN) 100 UNIT/ML FlexPen Check blood sugars with meals and administer as follows: BG < 200 - no insulin ; BG 200-249 = 2 units; BG 250-299 = 4 units; BG 300-349 = 6 units; BG 350-399 = 8 units; BG 400+ = 10 units and recheck in 15 minutes.  Maximum of 30 units per day. 10/15/23  Yes Orlean Alan HERO, FNP  levothyroxine  (SYNTHROID ) 112 MCG tablet TAKE 1 TABLET BY MOUTH ONCE DAILY BEFORE BREAKFAST 12/09/23  Yes Orlean Alan HERO, FNP  Multiple Vitamin (MULTIVITAMIN WITH MINERALS) TABS tablet Take 1 tablet by mouth daily. 09/26/22  Yes Wieting, Richard, MD  Oxcarbazepine  (TRILEPTAL ) 300 MG tablet Take 2 tablets (600 mg total) by mouth 2 (two) times daily. 01/20/24  Yes Orlean Alan HERO, FNP  pantoprazole  (PROTONIX ) 40 MG tablet Take 1 tablet (40 mg total) by mouth daily. 01/12/24  Yes Orlean Alan HERO, FNP  PARoxetine  (PAXIL ) 40 MG tablet Take 1 tablet (40 mg total) by mouth daily. 05/19/23  Yes Fernand Fredy RAMAN, MD  prazosin  (MINIPRESS ) 2 MG capsule Take 2 mg by mouth 2 (two) times daily. 01/23/24  Yes [provider]  promethazine (PHENERGAN) 12.5 MG tablet TAKE 1 TABLET BY MOUTH EVERY 6 HOURS AS NEEDED NAUSEA *NEW PRESCRIPTION REQUEST* 12/15/23  Yes Orlean Alan HERO, FNP  propranolol  (INDERAL ) 20 MG tablet Take 20 mg by mouth 2 (two) times daily as needed. 01/23/24  Yes [provider]  QUEtiapine  (SEROQUEL ) 100 MG tablet Take 3 tablets (300 mg total) by mouth at bedtime. 07/06/23  Yes Orlean Alan HERO, FNP  ranolazine  (RANEXA ) 1000 MG SR tablet TAKE ONE (1) TABLET BY MOUTH TWICE DAILY *NEW PRESCRIPTION REQUEST* 12/15/23  Yes Orlean Alan HERO, FNP  tiZANidine  (ZANAFLEX ) 4 MG tablet Take 2 tablets (8 mg total) by mouth 3 (three) times daily. 01/12/24  Yes Orlean Alan HERO, FNP  Topiramate  ER (TROKENDI  XR) 200 MG CP24 Take 1 capsule (200 mg total) by mouth daily. 11/17/23  Yes Orlean Alan HERO, FNP  traZODone  (DESYREL ) 150 MG tablet Take 2 tablets (300 mg total) by mouth at bedtime. 07/06/23  Yes Orlean Alan HERO, FNP  TROKENDI  XR 200 MG CP24 Take 1 capsule (200 mg total) by mouth daily. 01/12/24  Yes Orlean Alan HERO, FNP  Ubrogepant  (UBRELVY ) 100 MG TABS Take 1 tablet (100 mg total) by mouth as needed. 01/12/24  Yes Orlean Alan HERO, FNP  Vitamin D , Ergocalciferol , (DRISDOL ) 1.25 MG (50000 UNIT) CAPS capsule TAKE 1 CAPSULE BY MOUTH EVERY WEEK *NEW PRESCRIPTION REQUEST* 12/15/23  Yes Orlean Alan HERO, FNP  vitamin D3 (CHOLECALCIFEROL ) 25 MCG tablet Take 1 tablet (1,000 Units total) by mouth daily. 06/22/23  Yes Orlean Alan HERO, FNP  ACCU-CHEK GUIDE TEST test strip TEST BLOOD SUGAR UP TO THREE TIMES DAILY BEFORE MEALS *NEW PRESCRIPTION REQUEST* 12/15/23   Orlean Alan HERO, FNP  Alcohol Swabs (ALCOHOL PREP) 70 % PADS USE WITH INSULIN  THREE TIMES A DAY AS NEEDED *NEW PRESCRIPTION REQUEST* 12/15/23   Orlean Alan HERO, FNP  Continuous Glucose Sensor (FREESTYLE LIBRE 3 SENSOR) MISC Place 1 sensor on the skin every 14 days. Use to check glucose continuously Patient not taking: No sig reported 10/28/22   Orlean Alan HERO, FNP  cyanocobalamin  (VITAMIN B12) 1000 MCG/ML injection Inject 1 mL (1,000 mcg total) into the muscle every 30 (thirty) days. 10/30/23   Scoggins, Hospital Doctor, NP  EMBECTA PEN NEEDLE NANO 2 GEN 32G X 4 MM MISC USE WITH INSULIN  THREE TIMES A DAY AS NEEDED *NEW PRESCRIPTION REQUEST* 12/15/23   Orlean Alan HERO, FNP  famotidine  (PEPCID ) 20 MG tablet Take 1 tablet (20 mg total) by mouth 2 (two) times daily. Patient not taking: Reported on 01/26/2024 06/10/23 06/09/24  Orlean Alan HERO, FNP  metFORMIN (GLUCOPHAGE) 500 MG tablet Take 500 mg by mouth 2 (two) times daily. Patient not taking: No sig reported 06/09/23   [provider]  ranolazine  (RANEXA ) 500 MG 12 hr tablet TAKE ONE TABLET BY MOUTH TWICE A DAY Patient not taking: Reported on 01/26/2024 07/20/23   Orlean Alan HERO, FNP  sucralfate  (CARAFATE ) 1 g tablet Take 1 tablet (1 g total) by mouth 4 (four) times daily. Patient not taking: Reported on 01/26/2024 06/10/23   Orlean Alan HERO, FNP     ALLERGIES:  Allergies  Allergen Reactions   Depakote [Divalproex Sodium] Shortness Of Breath   Pregabalin Other (See Comments), Rash and Shortness Of Breath     Other Reaction: falls; decreased coordination  Pt ts that she passes out when she takes this med.   Other Reaction: falls; decreased coordination    Pt ts that she passes out when she takes this med.  Other Reaction: falls; decreased coordination, , Pt ts that she passes out when she takes this med.   Valproic Acid  Other (See Comments) and  Shortness Of Breath    Other Reaction: Other reaction  SOB   Levetiracetam Itching and Rash     SOCIAL HISTORY:  Social History   Socioeconomic History   Marital status: Widowed    Spouse name: Not on file   Number of children: Not on file   Years of education: Not on file   Highest education level: Not on file  Occupational History   Not on file  Tobacco Use   Smoking status: Never   Smokeless tobacco: Never  Vaping Use   Vaping status: Never Used  Substance and Sexual Activity   Alcohol use: Not Currently   Drug use: Never   Sexual activity: Not on file  Other Topics Concern   Not on file  Social History Narrative   Not on file   Social Drivers of Health   Financial Resource Strain: Not on file  Food Insecurity: No Food Insecurity (01/26/2024)   Hunger Vital Sign    Worried About Running Out of Food in the Last Year: Never true    Ran Out of Food in the Last Year: Never true  Transportation Needs: No Transportation Needs (01/26/2024)   PRAPARE - Administrator, Civil Service (Medical): No    Lack of Transportation (Non-Medical): No  Physical Activity: Not on file  Stress: Not on file  Social Connections: Socially Isolated (06/13/2023)   Social Connection and Isolation Panel    Frequency of Communication with Friends and Family: More than three times a week    Frequency of Social Gatherings with Friends and Family: More than three times a week    Attends Religious Services: Never    Database Administrator or Organizations: No    Attends Banker Meetings: Never    Marital Status: Widowed  Intimate  Partner Violence: Not At Risk (01/26/2024)   Humiliation, Afraid, Rape, and Kick questionnaire    Fear of Current or Ex-Partner: No    Emotionally Abused: No    Physically Abused: No    Sexually Abused: No     FAMILY HISTORY:  Family History  Problem Relation Age of Onset   Cancer Mother    Hypertension Mother    COPD Mother    Asthma Sister    Psoriasis Sister    Epilepsy Brother       REVIEW OF SYSTEMS:  Review of Systems  Constitutional:  Negative for chills and fever.  Respiratory:  Negative for cough and sputum production.   Cardiovascular:  Negative for chest pain and palpitations.  Gastrointestinal:  Positive for abdominal pain and diarrhea. Negative for blood in stool, constipation, nausea and vomiting.  Genitourinary:  Negative for dysuria and urgency.  All other systems reviewed and are negative.   VITAL SIGNS:  Temp:  [98 F (36.7 C)-98.4 F (36.9 C)] 98 F (36.7 C) (11/18 0450) Pulse Rate:  [66-117] 93 (11/18 0454) Resp:  [16-30] 18 (11/18 0454) BP: (94-148)/(62-119) 105/77 (11/18 0450) SpO2:  [94 %-100 %] 99 % (11/18 0454) Weight:  [92.9 kg] 92.9 kg (11/18 0454)     Height: 5' 3 (160 cm) Weight: 92.9 kg BMI (Calculated): 36.29   INTAKE/OUTPUT:  11/17 0701 - 11/18 0700 In: 1014.8 [IV Piggyback:1014.8] Out: -   PHYSICAL EXAM:  Physical Exam Vitals and nursing note reviewed. Exam conducted with a chaperone present.  Constitutional:      General: She is not in acute distress.    Appearance: She is well-developed. She is obese. She  is not ill-appearing.     Comments: Resting in bed; NAD  HENT:     Head: Normocephalic and atraumatic.  Eyes:     General: No scleral icterus.    Extraocular Movements: Extraocular movements intact.  Cardiovascular:     Rate and Rhythm: Normal rate.     Heart sounds: Normal heart sounds. No murmur heard. Pulmonary:     Effort: Pulmonary effort is normal. No respiratory distress.  Abdominal:     General: Abdomen is  flat. A surgical scar is present. There is no distension.     Palpations: Abdomen is soft.     Tenderness: There is generalized abdominal tenderness. There is no guarding or rebound.     Comments: Abdomen is soft, she is diffusely tender but certainly worse on the right, she is not distended, no rebound/guarding. Certainly without peritonitis   Genitourinary:    Comments: Deferred Skin:    General: Skin is warm and dry.     Findings: No erythema.  Neurological:     General: No focal deficit present.     Mental Status: She is alert and oriented to person, place, and time.  Psychiatric:        Mood and Affect: Mood normal.        Behavior: Behavior normal.      Labs:     Latest Ref Rng & Units 01/26/2024    6:48 AM 01/25/2024    8:33 PM 11/17/2023    3:43 PM  CBC  WBC 4.0 - 10.5 K/uL 9.6  17.1  8.7   Hemoglobin 12.0 - 15.0 g/dL 86.3  85.3  86.9   Hematocrit 36.0 - 46.0 % 42.5  45.3  41.1   Platelets 150 - 400 K/uL 309  426  307       Latest Ref Rng & Units 01/26/2024    1:57 AM 01/25/2024    8:33 PM 11/17/2023    3:43 PM  CMP  Glucose 70 - 99 mg/dL  794  875   BUN 6 - 20 mg/dL  23  10   Creatinine 9.55 - 1.00 mg/dL  8.30  8.87   Sodium 864 - 145 mmol/L  132  141   Potassium 3.5 - 5.1 mmol/L 5.0  5.3  4.2   Chloride 98 - 111 mmol/L  100  108   CO2 22 - 32 mmol/L  18  19   Calcium  8.9 - 10.3 mg/dL  8.6  8.8   Total Protein 6.5 - 8.1 g/dL  6.5  6.3   Total Bilirubin 0.0 - 1.2 mg/dL  0.4  0.2   Alkaline Phos 38 - 126 U/L  168  218   AST 15 - 41 U/L  51  23   ALT 0 - 44 U/L  36  24      Imaging studies:   CT Abdomen/Pelvis (01/25/2024) personally reviewed with changes consistent with colitis, and radiologist report reviewed below:  IMPRESSION: 1. Findings consistent with colitis involving the ascending, transverse, and descending colon, without pneumatosis or perforation. 2. Appendiceal wall thickening with caliber up to 9 mm. Findings may be reactive in etiology in  the setting of colitis. 3. Chronic-appearing L1 compression fracture with at least 60% vertebral body height loss.   Assessment/Plan:  58 y.o. female with diffuse abdominal pain and diarrhea found to have colitis, complicated by pertinent comorbidities including extensive previous abdominal surgery.   - Appreciate medicine admission - I do think changes of the appendix on  imaging are secondary to her pan-colitis and not representative of appendicitis. I do not think she warrants any surgical intervention at this time and would certainly try to avoid this if possible given her extensive history.  Surgery in this setting typically also requires sub-total colectomy. Again, no need for surgical interventions at this time  - CLD is reasonable; If she has continued or worsening pain, would recommend NPO - Continue IV Abx - Continue IVF resuscitation; monitor renal function    - Monitor abdominal examination - Stool studies are reasonable - Monitor leukocytosis; normalized - Okay to mobilize from surgical perspective - Further management per primary service; we will follow    All of the above findings and recommendations were discussed with the patient, and all of patient's questions were answered to her expressed satisfaction.  Thank you for the opportunity to participate in this patient's care.   -- Arthea Platt, PA-C [facility] 01/26/2024, 7:32 AM M-F: 7am - 4pm

## 2024-01-26 NOTE — Assessment & Plan Note (Signed)
 Continue home Synthroid

## 2024-01-26 NOTE — Plan of Care (Signed)
  Problem: Education: Goal: Ability to describe self-care measures that may prevent or decrease complications (Diabetes Survival Skills Education) will improve Outcome: Progressing Goal: Individualized Educational Video(s) Outcome: Progressing   Problem: Skin Integrity: Goal: Risk for impaired skin integrity will decrease Outcome: Progressing   Problem: Activity: Goal: Risk for activity intolerance will decrease Outcome: Progressing   Problem: Pain Managment: Goal: General experience of comfort will improve and/or be controlled Outcome: Progressing

## 2024-01-26 NOTE — Assessment & Plan Note (Signed)
 Mild tachycardia.  Not  on any anticoagulation. - Continue to monitor

## 2024-01-26 NOTE — Assessment & Plan Note (Signed)
 Blood pressure on lower normal goal. Not on any medications at home. - Continue to monitor

## 2024-01-26 NOTE — ED Notes (Signed)
 CCMD called at this time.

## 2024-01-26 NOTE — Progress Notes (Signed)
 Unable to get am labs at this time, lab to attempt later in am

## 2024-01-26 NOTE — Hospital Course (Addendum)
 Partly taken from prior notes.  Diana Quinn is a 58 y.o. female with medical history significant of HTN, HLD, DM, CAD, hypothyroidism, depression with anxiety, bipolar, schizophrenia, CKD stage II, A-fib not on anticoagulants, seizure, who presents with abdominal pain, diarrhea.   On presentation hemodynamically stable, labs with WBC of 17.1, worsening renal function, lactic acid 2.1, lipase 14.   pseudo hyponatremia secondary to hyperglycemia. CT abdomen and pelvis consistent with colitis involving the ascending, transverse and descending colon, without pneumatosis or perforation. Also noted appendiceal wall thickening and a chronic appearing L1 compression fracture.  General surgery was also consulted and they do not think that she has any concern of acute appendicitis and recommending conservative management.  She was started on Zosyn .  Cultures were ordered.  11/18: Vital stable, leukocytosis resolved, potassium of 5.2, CO2 18, lactic acid 2.0, BUN 28, creatinine 1.78, UDS negative, preliminary blood cultures negative in 12 hours. Patient did not had any more diarrhea, C. difficile and GI pathogen ordered but never collected. Discontinuing Dilaudid  as causing a lot of sedation.  PT was also ordered.  Family has more social concern related to her.  Message sent to social worker to visit her tomorrow in the presence of son.

## 2024-01-26 NOTE — Assessment & Plan Note (Signed)
 -  Continue Lipitor

## 2024-01-26 NOTE — Progress Notes (Signed)
 Pt with difficulty arousing at this time.  Pt awakens to voice but unable to stay awake for questioning by MD at bedside.  This RN made MD aware that pt was very alert prior to administration of Dilaudid .  O2 at 91% on room air.  O2 applied via n/c at 2L with an increase of O2 to 97%.  Will leave on patient until more alert.  MD to d/c IV Dilaudid .

## 2024-01-26 NOTE — Assessment & Plan Note (Addendum)
 Creatinine 1.78 today, baseline seems to be around 1.  Likely secondary to dehydration with GI losses. - Giving some IV fluid -Monitor renal function -Avoid nephrotoxins

## 2024-01-26 NOTE — Progress Notes (Signed)
 Pharmacy Antibiotic Note  Diana Quinn is a 58 y.o. female admitted on 01/25/2024 with acute colitis.  Pharmacy has been consulted for Zosyn  dosing.  Plan: Zosyn  3.375g IV q8h (4 hour infusion).     Temp (24hrs), Avg:98.4 F (36.9 C), Min:98.4 F (36.9 C), Max:98.4 F (36.9 C)  Recent Labs  Lab 01/25/24 2033 01/25/24 2227  WBC 17.1*  --   CREATININE 1.69*  --   LATICACIDVEN  --  2.1*    Estimated Creatinine Clearance: 39.8 mL/min (A) (by C-G formula based on SCr of 1.69 mg/dL (H)).    Allergies  Allergen Reactions   Depakote [Divalproex Sodium] Shortness Of Breath   Pregabalin Other (See Comments), Rash and Shortness Of Breath    Other Reaction: falls; decreased coordination  Pt ts that she passes out when she takes this med.   Other Reaction: falls; decreased coordination    Pt ts that she passes out when she takes this med.  Other Reaction: falls; decreased coordination, , Pt ts that she passes out when she takes this med.   Valproic Acid  Other (See Comments) and Shortness Of Breath    Other Reaction: Other reaction  SOB   Levetiracetam Itching and Rash    Antimicrobials this admission:   >>    >>   Dose adjustments this admission:   Microbiology results:  BCx:   UCx:    Sputum:    MRSA PCR:   Thank you for allowing pharmacy to be a part of this patient's care.  Nitesh Pitstick D 01/26/2024 12:23 AM

## 2024-01-26 NOTE — Assessment & Plan Note (Signed)
 No acute concern. -Continue home Topamax  and Trileptal  -Seizure precautions

## 2024-01-26 NOTE — Progress Notes (Addendum)
 Progress Note   Patient: Diana Quinn FMW:[id number] DOB: 07-12-1965 DOA: 01/25/2024     0 DOS: the patient was seen and examined on 01/26/2024   Brief hospital course: Partly taken from prior notes.  Diana Quinn is a 58 y.o. female with medical history significant of HTN, HLD, DM, CAD, hypothyroidism, depression with anxiety, bipolar, schizophrenia, CKD stage II, A-fib not on anticoagulants, seizure, who presents with abdominal pain, diarrhea.   On presentation hemodynamically stable, labs with WBC of 17.1, worsening renal function, lactic acid 2.1, lipase 14.   pseudo hyponatremia secondary to hyperglycemia. CT abdomen and pelvis consistent with colitis involving the ascending, transverse and descending colon, without pneumatosis or perforation. Also noted appendiceal wall thickening and a chronic appearing L1 compression fracture.  General surgery was also consulted and they do not think that she has any concern of acute appendicitis and recommending conservative management.  She was started on Zosyn .  Cultures were ordered.  11/18: Vital stable, leukocytosis resolved, potassium of 5.2, CO2 18, lactic acid 2.0, BUN 28, creatinine 1.78, UDS negative, preliminary blood cultures negative in 12 hours. Patient did not had any more diarrhea, C. difficile and GI pathogen ordered but never collected. Discontinuing Dilaudid  as causing a lot of sedation.  PT was also ordered.  Family has more social concern related to her.  Message sent to social worker to visit her tomorrow in the presence of son.  Assessment and Plan: * Acute colitis Still having some abdominal pain and asking for more Dilaudid  but appears too sedated.  No nausea, vomiting or diarrhea since in the hospital. C. Difficile screening and GI pathogen panel was ordered but never collected as there is no BM since in the hospital. - Continue with Zosyn  today -Advance diet as tolerated -Discontinue Dilaudid  -Continue  with p.o. pain management -Avoid narcotics as possible  AKI (acute kidney injury) Creatinine 1.78 today, baseline seems to be around 1.  Likely secondary to dehydration with GI losses. - Giving some IV fluid -Monitor renal function -Avoid nephrotoxins  Atrial fibrillation, chronic (HCC) Mild tachycardia.  Not  on any anticoagulation. - Continue to monitor  CAD (coronary artery disease) Patient has a history of CAD.  No chest pain. -Continue home Lipitor, Ranexa   Seizure (HCC) No acute concern. -Continue home Topamax  and Trileptal  -Seizure precautions  Hypothyroidism - Continue home Synthroid   Essential hypertension Blood pressure on lower normal goal. Not on any medications at home. - Continue to monitor  Hyperkalemia Potassium of 5.2. - Getting some IV fluid -Ordered for Lokelma  -Continue to monitor  Type II diabetes mellitus (HCC) CBG within goal. - SSI  HLD (hyperlipidemia) - Continue Lipitor  Depression with anxiety History of bipolar disorder and Schizophrenia. - Continue home medications which include Paxil , as needed clonazepam , trazodone , Seroquel  and Caplyta .  Subjective: Patient was seen and examined today.  Appears quite sedated has received Dilaudid  earlier.  Denies any pain.  No nausea, vomiting or diarrhea since in the hospital.  Physical Exam: Vitals:   01/26/24 0400 01/26/24 0450 01/26/24 0454 01/26/24 0828  BP: 105/67 105/77  91/65  Pulse: (!) 111 (!) 117 93 (!) 109  Resp: (!) 30 19 18 18   Temp:  98 F (36.7 C)  98.3 F (36.8 C)  TempSrc:      SpO2: 100% 99% 99% 94%  Weight:   92.9 kg   Height:   5' 3 (1.6 m)    General.  Obese lady, in no acute distress. Pulmonary.  Lungs clear  bilaterally, normal respiratory effort. CV.  Regular rate and rhythm, no JVD, rub or murmur. Abdomen.  Soft, nontender, nondistended, BS positive. CNS.  Somnolent but arousable.  No focal neurologic deficit. Extremities.  No edema, no cyanosis, pulses  intact and symmetrical.  Data Reviewed: Prior data reviewed  Family Communication: Discussed with patient  Disposition: Status is: Inpatient Remains inpatient appropriate because: Severity of illness  Planned Discharge Destination: Home  DVT prophylaxis.  Subcu heparin  Time spent: 50 minutes  This record has been created using Conservation officer, historic buildings. Errors have been sought and corrected,but may not always be located. Such creation errors do not reflect on the standard of care.   Author: Amaryllis Dare, MD 01/26/2024 4:19 PM  For on call review [email or web address].

## 2024-01-26 NOTE — ED Provider Notes (Signed)
  Physical Exam  BP 116/71   Pulse 66   Temp 98.4 F (36.9 C) (Oral)   Resp 19   SpO2 97%   Physical Exam  Procedures  Procedures  ED Course / MDM   Clinical Course as of 01/26/24 0005  Mon Jan 25, 2024  2329 S/o from Dr. Jossie: - 53F w/ diarrhea, colitis, ?appendicitis - gen surg aware, no plan for surgical intervention at this time, will continue to monitor clinically  Hospitalist paged [MM]  Tue Jan 26, 2024  0005 Presented to hospitalist for admission [MM]    Clinical Course User Index [MM] Clarine Ozell LABOR, MD   Medical Decision Making Amount and/or Complexity of Data Reviewed Labs: ordered. Radiology: ordered.  Risk Prescription drug management. Decision regarding hospitalization.          Clarine Ozell LABOR, MD 01/26/24 [phone]

## 2024-01-26 NOTE — Assessment & Plan Note (Addendum)
 Patient has a history of CAD.  No chest pain. -Continue home Lipitor, Ranexa 

## 2024-01-26 NOTE — Assessment & Plan Note (Signed)
 History of bipolar disorder and Schizophrenia. - Continue home medications which include Paxil , as needed clonazepam , trazodone , Seroquel  and Caplyta .

## 2024-01-26 NOTE — Assessment & Plan Note (Signed)
 Still having some abdominal pain and asking for more Dilaudid  but appears too sedated.  No nausea, vomiting or diarrhea since in the hospital. C. Difficile screening and GI pathogen panel was ordered but never collected as there is no BM since in the hospital. - Continue with Zosyn  today -Advance diet as tolerated -Discontinue Dilaudid  -Continue with p.o. pain management -Avoid narcotics as possible

## 2024-01-27 ENCOUNTER — Inpatient Hospital Stay

## 2024-01-27 DIAGNOSIS — K529 Noninfective gastroenteritis and colitis, unspecified: Secondary | ICD-10-CM | POA: Diagnosis not present

## 2024-01-27 LAB — GASTROINTESTINAL PANEL BY PCR, STOOL (REPLACES STOOL CULTURE)

## 2024-01-27 LAB — CBC
HCT: 32.5 % — ABNORMAL LOW (ref 36.0–46.0)
Hemoglobin: 10.7 g/dL — ABNORMAL LOW (ref 12.0–15.0)
MCH: 31.5 pg (ref 26.0–34.0)
MCHC: 32.9 g/dL (ref 30.0–36.0)
MCV: 95.6 fL (ref 80.0–100.0)
Platelets: 266 K/uL (ref 150–400)
RBC: 3.4 MIL/uL — ABNORMAL LOW (ref 3.87–5.11)
RDW: 14.6 % (ref 11.5–15.5)
WBC: 11.5 K/uL — ABNORMAL HIGH (ref 4.0–10.5)
nRBC: 0 % (ref 0.0–0.2)

## 2024-01-27 LAB — RENAL FUNCTION PANEL
Albumin: 2.7 g/dL — ABNORMAL LOW (ref 3.5–5.0)
Anion gap: 9 (ref 5–15)
BUN: 30 mg/dL — ABNORMAL HIGH (ref 6–20)
CO2: 21 mmol/L — ABNORMAL LOW (ref 22–32)
Calcium: 7.2 mg/dL — ABNORMAL LOW (ref 8.9–10.3)
Chloride: 101 mmol/L (ref 98–111)
Creatinine, Ser: 1.53 mg/dL — ABNORMAL HIGH (ref 0.44–1.00)
GFR, Estimated: 39 mL/min — ABNORMAL LOW (ref >60)
Glucose, Bld: 143 mg/dL — ABNORMAL HIGH (ref 70–99)
Phosphorus: 2.6 mg/dL (ref 2.5–4.6)
Potassium: 4.3 mmol/L (ref 3.5–5.1)
Sodium: 131 mmol/L — ABNORMAL LOW (ref 135–145)

## 2024-01-27 LAB — GLUCOSE, CAPILLARY
Glucose-Capillary: 112 mg/dL — ABNORMAL HIGH (ref 70–99)
Glucose-Capillary: 136 mg/dL — ABNORMAL HIGH (ref 70–99)
Glucose-Capillary: 104 mg/dL — ABNORMAL HIGH (ref 70–99)

## 2024-01-27 LAB — MISC LABCORP TEST (SEND OUT): Labcorp test code: 83935

## 2024-01-27 LAB — MAGNESIUM: Magnesium: 1.8 mg/dL (ref 1.7–2.4)

## 2024-01-27 MED ORDER — LACTATED RINGERS IV BOLUS
500.0000 mL | Freq: Once | INTRAVENOUS | Status: AC
  Administered 2024-01-27: 500 mL via INTRAVENOUS

## 2024-01-27 MED ORDER — SACCHAROMYCES BOULARDII 250 MG PO CAPS
250.0000 mg | ORAL_CAPSULE | Freq: Two times a day (BID) | ORAL | Status: DC
  Administered 2024-01-27 – 2024-02-05 (×19): 250 mg via ORAL
  Filled 2024-01-27 (×20): qty 1

## 2024-01-27 MED ORDER — CHLORHEXIDINE GLUCONATE CLOTH 2 % EX PADS
6.0000 | MEDICATED_PAD | Freq: Every day | CUTANEOUS | Status: DC
  Administered 2024-01-27 – 2024-02-05 (×6): 6 via TOPICAL

## 2024-01-27 MED ORDER — VITAMIN D (ERGOCALCIFEROL) 1.25 MG (50000 UNIT) PO CAPS
50000.0000 [IU] | ORAL_CAPSULE | ORAL | Status: DC
  Administered 2024-01-27 – 2024-02-03 (×2): 50000 [IU] via ORAL
  Filled 2024-01-27 (×2): qty 1

## 2024-01-27 MED ORDER — VITAMIN B-12 1000 MCG PO TABS: 500.0000 ug | ORAL_TABLET | Freq: Every day | ORAL | Status: DC

## 2024-01-27 MED ORDER — LACTATED RINGERS IV SOLN: INTRAVENOUS | Status: AC

## 2024-01-27 MED ORDER — CYANOCOBALAMIN 1000 MCG/ML IJ SOLN
1000.0000 ug | Freq: Every day | INTRAMUSCULAR | Status: AC
  Administered 2024-01-28 – 2024-02-03 (×7): 1000 ug via INTRAMUSCULAR
  Filled 2024-01-27 (×7): qty 1

## 2024-01-27 NOTE — Progress Notes (Signed)
 MEWS Progress Note  Patient Details Name: Diana Quinn MRN: [id number] DOB: 15-Feb-1966 Today's Date: 01/27/2024   MEWS Flowsheet Documentation:  Assess: MEWS Score Temp: 99.5 F (37.5 C) BP: (!) 136/55 MAP (mmHg): 72 Pulse Rate: (!) 114 ECG Heart Rate: (!) 103 Resp: 18 Level of Consciousness: Alert SpO2: 100 % O2 Device: Nasal Cannula Assess: MEWS Score MEWS Temp: 0 MEWS Systolic: 0 MEWS Pulse: 2 MEWS RR: 0 MEWS LOC: 0 MEWS Score: 2 MEWS Score Color: Yellow Assess: SIRS CRITERIA SIRS Temperature : 0 SIRS Respirations : 0 SIRS Pulse: 1 SIRS WBC: 0 SIRS Score Sum : 1 SIRS Temperature : 0 SIRS Pulse: 1 SIRS Respirations : 0 SIRS WBC: 0 SIRS Score Sum : 1 Assess: if the MEWS score is Yellow or Red Were vital signs accurate and taken at a resting state?: Yes Does the patient meet 2 or more of the SIRS criteria?: No MEWS guidelines implemented : No, previously yellow, continue vital signs every 4 hours        Haniya Fern L Lailoni Baquera 01/27/2024, 6:10 AM

## 2024-01-27 NOTE — TOC Initial Note (Signed)
 Transition of Care [facility]) - Initial/Assessment Note    Patient Details  Name: Diana Quinn MRN: [id number] Date of Birth: 1966/01/10  [facility]) CM/SW Contact:    Alfonso Rummer, LCSW Phone Number: 01/27/2024, 3:35 PM  Clinical Narrative:                  KEN DELENA Rummer meet with pt at bedside. Pt denied snf recommendation and reports she will go home. Pt reports son and daughter in law live close and provide adequate support. Pt denied assistance of home healt referral  Expected Discharge Plan: Home/Self Care Barriers to Discharge: Continued Medical Work up   Patient Goals and CMS Choice            Expected Discharge Plan and Services       Living arrangements for the past 2 months: Single Family Home                                      Prior Living Arrangements/Services Living arrangements for the past 2 months: Single Family Home Lives with:: Self   Do you feel safe going back to the place where you live?: Yes      Need for Family Participation in Patient Care: No (Comment) Care giver support system in place?: No (comment)   Criminal Activity/Legal Involvement Pertinent to Current Situation/Hospitalization: No - Comment as needed  Activities of Daily Living   ADL Screening (condition at time of admission) Independently performs ADLs?: Yes (appropriate for developmental age) Is the patient deaf or have difficulty hearing?: No Does the patient have difficulty seeing, even when wearing glasses/contacts?: Yes Does the patient have difficulty concentrating, remembering, or making decisions?: No  Permission Sought/Granted                  Emotional Assessment Appearance:: Appears stated age Attitude/Demeanor/Rapport: Self-Confident Affect (typically observed): Appropriate Orientation: : Oriented to Self, Oriented to Place, Oriented to  Time, Oriented to Situation      Admission diagnosis:  Acute colitis [K52.9] Patient  Active Problem List   Diagnosis Date Noted   Acute colitis 01/26/2024   Seizure (HCC) 01/26/2024   HLD (hyperlipidemia) 01/26/2024   CAD (coronary artery disease) 01/26/2024   Hypothyroidism 01/26/2024   Depression with anxiety 01/26/2024   Bipolar disorder (HCC) 01/26/2024   Atrial fibrillation, chronic (HCC) 01/26/2024   Hyperkalemia 01/26/2024   AKI (acute kidney injury) 01/26/2024   Type II diabetes mellitus (HCC) 01/26/2024   Sternal pain 10/03/2022   Constipation 10/03/2022   Type 2 diabetes mellitus with hyperglycemia, without long-term current use of insulin  (HCC) 09/29/2022   Obesity (BMI 30-39.9) 09/25/2022   Major depressive disorder, recurrent episode, moderate (HCC) 09/14/2022   Left ankle sprain 04/14/2019   History of angina 04/10/2019   Acute marginal ulcer 12/16/2018   SVT (supraventricular tachycardia) 11/11/2018   Essential hypertension 11/04/2018   Seizures (HCC) 10/02/2018   H/O gastric bypass 07/27/2018   Iron deficiency anemia 07/27/2018   Spondylosis without myelopathy or radiculopathy, lumbosacral region 04/27/2018   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   Chronic hip pain (Bilateral) 04/14/2018   Chronic sacroiliac joint pain (Bilateral) 04/14/2018   Lumbar facet syndrome (Bilateral) (L>R) 04/14/2018   Neurogenic pain 04/14/2018   Disorder of sacrum 04/14/2018   Neuralgia, neuritis or radiculitis 04/14/2018   Osteoarthritis of knees (Bilateral) 04/13/2018   Tricompartment osteoarthritis of knee (Right) 04/13/2018  Chronic lower extremity pain (Secondary Area of Pain) (Bilateral) (L>R) 03/31/2018   Chronic neck pain [facility] Area of Pain) (Bilateral) (L>R) 03/31/2018   Pharmacologic therapy 03/31/2018   Problems influencing health status 03/31/2018   Chronic knee pain (Bilateral) (L>R) 03/31/2018   Chronic pain syndrome 03/31/2018   DDD (degenerative disc disease), lumbar 02/08/2018   Mixed hyperlipidemia 02/08/2018   Lumbar spondylosis  02/08/2018   Vitamin D  deficiency 02/08/2018   Angina pectoris 01/26/2018   Hypotension 01/26/2018   Impaired glucose tolerance 01/26/2018   B12 deficiency due to diet 01/26/2018   Schizophrenia (HCC) 01/26/2018   Hypoglycemia after GI (gastrointestinal) surgery 02/23/2017   Unsteady gait 07/04/2016   Uncomplicated opioid dependence (HCC) 12/31/2014   Lumbar pain 02/23/2014   Lumbar radiculopathy 02/23/2014   Recurrent abdominal pain 06/06/2012   Seizure disorder (HCC) 07/10/2011   Status post bariatric surgery 03/24/2011   Generalized anxiety disorder 03/24/2011   Acquired hypothyroidism 03/24/2011   Migraine, unspecified, not intractable, without status migrainosus 03/24/2011   Sleep disturbances 03/24/2011   Bipolar 1 disorder, mixed, moderate (HCC) 03/24/2011   PCP:  Orlean Alan HERO, FNP Pharmacy:   [facility] [address] [facility] - [place], [place] - [address] AT [facility] [address] [place] [address] Phone: [phone] Fax: [phone]  [facility] [address] [place] [address] Phone: [phone] Fax: [phone]  [id number] at [facility], [place] - [address] AT [address] [place] [address] Phone: [phone] Fax: [phone]  [facility] - [address], [place] - [address] [place] [address] Phone: [phone] Fax: [phone]     Social Drivers of Health (SDOH) Social History: SDOH Screenings   Food Insecurity: No Food Insecurity (01/26/2024)  Housing: High Risk (01/26/2024)  Transportation Needs: No Transportation Needs (01/26/2024)  Utilities: Not At Risk (01/26/2024)  Depression (PHQ2-9): Low Risk  (10/26/2018)  Social Connections: Socially Isolated (06/13/2023)  Tobacco Use: Low Risk  (01/26/2024)   SDOH Interventions:     Readmission Risk Interventions     No data to display

## 2024-01-27 NOTE — Progress Notes (Signed)
 Progress Note   Patient: Diana Quinn FMW:[id number] DOB: [birth year] DOA: 01/25/2024     1 DOS: the patient was seen and examined on 01/27/2024   Brief hospital course: Partly taken from prior notes.  Diana Quinn is a 59 y.o. female with medical history significant of HTN, HLD, DM, CAD, hypothyroidism, depression with anxiety, bipolar, schizophrenia, CKD stage II, A-fib not on anticoagulants, seizure, who presents with abdominal pain, diarrhea.   On presentation hemodynamically stable, labs with WBC of 17.1, worsening renal function, lactic acid 2.1, lipase 14.   pseudo hyponatremia secondary to hyperglycemia. CT abdomen and pelvis consistent with colitis involving the ascending, transverse and descending colon, without pneumatosis or perforation. Also noted appendiceal wall thickening and a chronic appearing L1 compression fracture.  General surgery was also consulted and they do not think that she has any concern of acute appendicitis and recommending conservative management.  She was started on Zosyn .  Cultures were ordered.  11/18: Vital stable, leukocytosis resolved, potassium of 5.2, CO2 18, lactic acid 2.0, BUN 28, creatinine 1.78, UDS negative, preliminary blood cultures negative in 12 hours. Patient did not had any more diarrhea, C. difficile and GI pathogen ordered but never collected. Discontinuing Dilaudid  as causing a lot of sedation.  PT was also ordered.  Family has more social concern related to her.  Message sent to social worker to visit her tomorrow in the presence of son.  Assessment and Plan:  # Acute colitis - Negative C. difficile and GI pathogen panel -Continue with Zosyn   -Advance diet as tolerated -Discontinue Dilaudid  -Continue with p.o. pain management -Avoid narcotics as possible Continue IV fluid for hydration Started probiotics  AKI (acute kidney injury) Creatinine 1.78> 1.53 Baseline sCr around 1.  Likely secondary to dehydration  with GI losses. - Continue IV fluid -Monitor renal function -Avoid nephrotoxins  # Urinary retention US  renal: No acute renal abnormality identified to explain acute kidney injury.  Bladder scan showed 575 mL 11/19, Foley catheter was inserted  Atrial fibrillation, chronic (HCC) Mild tachycardia.  Not  on any anticoagulation. - Continue to monitor  CAD (coronary artery disease) Patient has a history of CAD.  No chest pain. -Continue home Lipitor, Ranexa   Seizure (HCC) No acute concern. -Continue home Topamax  and Trileptal  -Seizure precautions  Hypothyroidism - Continue home Synthroid   Essential hypertension Blood pressure on lower normal goal. Not on any medications at home. - Continue to monitor  Hyperkalemia Potassium of 5.2>> 4.3 - Getting some IV fluid -Ordered for Lokelma  -Continue to monitor  Type II diabetes mellitus (HCC) CBG within goal. - SSI  HLD (hyperlipidemia) - Continue Lipitor  Depression with anxiety History of bipolar disorder and Schizophrenia. - Continue home medications which include Paxil , as needed clonazepam , trazodone , Seroquel  and Caplyta .  # Vitamin D  insufficiency, vitamin D  level 22, started vitamin D  55 units p.o. weekly  # History of vitamin B12 deficiency on B12 monthly injection. B12 level 370, goal 400, started B12 injection during hospital stay for   Subjective: No significant event overnight, patient was sleepy, woke up and AO x 3.  Still having diarrhea, 3 episodes today.  Abdominal pain 3-4/10 at rest but increases on the movement.  No any other complaints.   Physical Exam: Vitals:   01/27/24 0309 01/27/24 0643 01/27/24 0847 01/27/24 1253  BP: (!) 136/55 93/65 (!) 98/39 (!) 89/48  Pulse: (!) 114 (!) 107 (!) 105 89  Resp: 18 17 14 16   Temp: 99.5 F (37.5 C) (!) 100.4  F (38 C) 99.3 F (37.4 C) 98.5 F (36.9 C)  TempSrc:  Oral Oral Oral  SpO2: 100% 95% 93% 99%  Weight:      Height:       General.  NAD,  sleepy. Pulmonary.  Lungs clear bilaterally, normal respiratory effort. CV.  Regular rate and rhythm, no JVD, rub or murmur. Abdomen.  Soft, BS positive, ND, generalized tenderness  CNS.  Somnolent but arousable.  No focal neurologic deficit. Extremities.  No edema  Data Reviewed: Prior data reviewed  Family Communication: Discussed with patient  Disposition: Status is: Inpatient Remains inpatient appropriate because: Severity of illness  Planned Discharge Destination: Home  DVT prophylaxis.  Subcu heparin  Time spent: 55 minutes  This record has been created using Conservation officer, historic buildings. Errors have been sought and corrected,but may not always be located. Such creation errors do not reflect on the standard of care.    Author: Elvan Sor, MD 01/27/2024 5:14 PM  For on call review [email or web address].

## 2024-01-27 NOTE — Progress Notes (Signed)
 [facility] SURGICAL PROGRESS NOTE (cpt 743 089 1978)  Hospital Day(s): 1.   Interval History: Patient seen and examined, no acute events or new complaints overnight. Patient reports she continues to have abdominal pain rather generalized in nature; right certainly worse than left. No fever, chills, nausea, emesis. Very mild leukocytosis; WBC 11.5K. Hgb to 10.7; suspected changes are dilutional. Renal function with some improvement; sCr - 1.53 (from 1.78); UO - 300 ccs. She did require straight catheterization. GI Panel was negative. She is on FLD. Continues on Zosyn . She is having bowel function; non-bloody.   Review of Systems:  Constitutional: denies fever, chills  HEENT: denies cough or congestion  Respiratory: denies any shortness of breath  Cardiovascular: denies chest pain or palpitations  Gastrointestinal: + abdominal pain; denied N/V Genitourinary: denies burning with urination or urinary frequency Musculoskeletal: denies pain, decreased motor or sensation  Vital signs in last 24 hours: [min-max] current  Temp:  [98 F (36.7 C)-100.4 F (38 C)] 100.4 F (38 C) (11/19 0643) Pulse Rate:  [107-114] 107 (11/19 0643) Resp:  [17-18] 17 (11/19 0643) BP: (91-158)/(55-115) 93/65 (11/19 0643) SpO2:  [93 %-100 %] 95 % (11/19 0643)     Height: 5' 3 (160 cm) Weight: 92.9 kg BMI (Calculated): 36.29   Intake/Output last 2 shifts:  11/18 0701 - 11/19 0700 In: 240 [P.O.:240] Out: 300 [Urine:300]   Physical Exam:  Constitutional: alert, cooperative and no distress  HENT: normocephalic without obvious abnormality  Eyes: PERRL, EOM's grossly intact and symmetric  Respiratory: breathing non-labored at rest  Cardiovascular: regular rate and sinus rhythm  Gastrointestinal: soft, she continues to have generalized tenderness, right > left, non-distended, no rebound/guarding. Certainly not peritonitic.   Labs:     Latest Ref Rng & Units 01/27/2024    6:24 AM 01/26/2024    6:48  AM 01/25/2024    8:33 PM  CBC  WBC 4.0 - 10.5 K/uL 11.5  9.6  17.1   Hemoglobin 12.0 - 15.0 g/dL 89.2  86.3  85.3   Hematocrit 36.0 - 46.0 % 32.5  42.5  45.3   Platelets 150 - 400 K/uL 266  309  426       Latest Ref Rng & Units 01/26/2024    6:48 AM 01/26/2024    1:57 AM 01/25/2024    8:33 PM  CMP  Glucose 70 - 99 mg/dL 845   794   BUN 6 - 20 mg/dL 28   23   Creatinine 9.55 - 1.00 mg/dL 8.21   8.30   Sodium 864 - 145 mmol/L 134   132   Potassium 3.5 - 5.1 mmol/L 5.2  5.0  5.3   Chloride 98 - 111 mmol/L 103   100   CO2 22 - 32 mmol/L 18   18   Calcium  8.9 - 10.3 mg/dL 7.9   8.6   Total Protein 6.5 - 8.1 g/dL   6.5   Total Bilirubin 0.0 - 1.2 mg/dL   0.4   Alkaline Phos 38 - 126 U/L   168   AST 15 - 41 U/L   51   ALT 0 - 44 U/L   36      Imaging studies: No new pertinent imaging studies   Assessment/Plan: 58 y.o. female with diffuse abdominal pain and diarrhea found to have colitis, complicated by pertinent comorbidities including extensive previous abdominal surgery.   - Again, I do think changes of the appendix on imaging are secondary to her pan-colitis and not representative  of appendicitis. I do not think she warrants any surgical intervention at this time and would certainly try to avoid this if possible given her extensive history.  Surgery in this setting typically also requires sub-total colectomy. Again, no need for surgical interventions at this time             - Okay for diet as tolerated - Continue IV Abx (Zosyn ) - Continue IVF resuscitation; monitor renal function               - Monitor abdominal examination - Stool studies are reasonable - Monitor leukocytosis  - Monitor renal function; improved  - Okay to mobilize from surgical perspective - Further management per primary service; we will follow     All of the above findings and recommendations were discussed with the patient, and all of patient's questions were answered to her expressed  satisfaction.   -- Arthea Platt, PA-C [facility] 01/27/2024, 7:26 AM M-F: 7am - 4pm

## 2024-01-27 NOTE — Progress Notes (Addendum)
 Pt son at bedside requesting to speak with this RN.  Upon entering room, son expressed concern in regards to pt behavior.  Pt A&Ox4 but pt is very spastic with frequent jerking movements of arms and head.  Pt also with irrational thought process when speaking.  BS assessed along with vitals.  PT with temp of 101 and BP of 77/56.  Dr Von requested to come assess pt.  1755 Dr Von at bedside.  MD to place orders.

## 2024-01-27 NOTE — Progress Notes (Signed)
 Pt son at bedside requesting to be involved in pt care and decisions due to pt intermittently being incoherent and not able to accurately or completely explain current POC.  Per son, home health would be beneficial to pt which was refused by pt earlier in day per social worker note.  Message sent to [facility] reference same.  Will also make oncoming RN aware to pass information on to case management/case worker in am.

## 2024-01-27 NOTE — Evaluation (Signed)
 Physical Therapy Evaluation Patient Details Name: Diana Quinn MRN: [id number] DOB: [birth year] Today's Date: 01/27/2024  History of Present Illness  Pt is a 58 y/o F presenting to ED with c/o abdominal pain and diarrhea. Workup for acute colitis. PMH significant for HTN, HLD, DM, CAD, hypothyroidism, depression with anxiety, bipolar disorder, schizophrenia, CKD stage II, afib, seizures.   Clinical Impression  Pt A&Ox4, pleasant and agreeable to participate in PT evaluation. At baseline, pt reports rollator use for long distance ambulation, IND with ADLs, denies hx of falls. Pt was received in bed, required supervision-modA for bed mobility tasks, increased assistance to return to supine with VC for logroll sequencing to limit abdominal pain. Pt performed STS from elevated EOB with minA. Pt amb ~20ft in room with short, shuffled gait pattern throughout with no LOB or buckling. Distance self-limited due to pain and fatigue with short bout of activity. Vitals assessed during session and remained WFL throughout with pt on RA. Pt was left semi-reclined in bed at end of session with pt being transported to ultrasound. Pt is currently displaying deficits in activity tolerance, balance, and strength limiting her ability to participate in functional mobility and ADLs, would benefit from skilled PT intervention to address deficits and allow for safe return to PLOF.         If plan is discharge home, recommend the following: A little help with walking and/or transfers;A lot of help with bathing/dressing/bathroom;Assistance with cooking/housework;Assist for transportation;Help with stairs or ramp for entrance   Can travel by private vehicle   Yes    Equipment Recommendations Other (comment) (TBD at next venue of care)  Recommendations for Other Services       Functional Status Assessment Patient has had a recent decline in their functional status and demonstrates the ability to make significant  improvements in function in a reasonable and predictable amount of time.     Precautions / Restrictions Precautions Precautions: Fall Recall of Precautions/Restrictions: Intact Restrictions Weight Bearing Restrictions Per Provider Order: No      Mobility  Bed Mobility Overal bed mobility: Needs Assistance Bed Mobility: Rolling, Sidelying to Sit, Sit to Supine Rolling: Supervision, Used rails Sidelying to sit: Min assist, HOB elevated, Used rails   Sit to supine: Mod assist   General bed mobility comments: minA to assist with trunk elevation to exit bed. ModA BLE assist back into bed. Max VC for logroll sequencing to limit abdominal pain    Transfers Overall transfer level: Needs assistance Equipment used: Rolling walker (2 wheels) Transfers: Sit to/from Stand Sit to Stand: Min assist, From elevated surface           General transfer comment: MinA STS from elevated EOB with VC for hand placement on RW. Por eccentric control to sitting    Ambulation/Gait Ambulation/Gait assistance: Contact guard assist Gait Distance (Feet): 30 Feet Assistive device: Rolling walker (2 wheels) Gait Pattern/deviations: Step-to pattern, Decreased stride length, Wide base of support Gait velocity: decreased     General Gait Details: Pt amb short distance in room with no LOB or buckling. Poor activity tolerance, was fatigued with short bout of activity. Distance self-limited due to pain and fatigue  Stairs            Wheelchair Mobility     Tilt Bed    Modified Rankin (Stroke Patients Only)       Balance Overall balance assessment: Needs assistance Sitting-balance support: Feet supported Sitting balance-Leahy Scale: Good Sitting balance - Comments: steady static and  dynamic sitting   Standing balance support: Bilateral upper extremity supported, Reliant on assistive device for balance Standing balance-Leahy Scale: Fair Standing balance comment: heavy BUE support on RW in  standing                             Pertinent Vitals/Pain Pain Assessment Pain Assessment: 0-10 Pain Score: 9  Pain Location: Abdomen, with movement Pain Descriptors / Indicators: Grimacing, Guarding, Sharp Pain Intervention(s): Limited activity within patient's tolerance, Monitored during session, Repositioned    Home Living Family/patient expects to be discharged to:: Private residence Living Arrangements: Alone Available Help at Discharge: Family;Available PRN/intermittently Type of Home: Apartment Home Access: Other (comment) (pt has to ascend 2 curbs to get into her apartment)       Home Layout: One level Home Equipment: Agricultural Consultant (2 wheels);Educational Psychologist (4 wheels)      Prior Function Prior Level of Function : Independent/Modified Independent             Mobility Comments: ModI for mobility, uses rollator when ambulating long distances, denies hx of recent falls ADLs Comments: IND with ADLs     Extremity/Trunk Assessment   Upper Extremity Assessment Upper Extremity Assessment: Overall WFL for tasks assessed;Left hand dominant    Lower Extremity Assessment Lower Extremity Assessment: Generalized weakness       Communication   Communication Communication: No apparent difficulties    Cognition Arousal: Alert Behavior During Therapy: Anxious   PT - Cognitive impairments: No apparent impairments                       PT - Cognition Comments: A&Ox4, pleasant and cooperative Following commands: Intact       Cueing Cueing Techniques: Verbal cues, Tactile cues, Visual cues     General Comments      Exercises Other Exercises Other Exercises: BP assessed during session after transfer to sitting:132/53 (66). HR moniotred throughout session, HR 108bpm at rest, ranged from 112-126bpm with activity.   Assessment/Plan    PT Assessment Patient needs continued PT services  PT Problem List Decreased strength;Decreased  activity tolerance;Decreased balance;Decreased mobility;Pain       PT Treatment Interventions DME instruction;Gait training;Stair training;Functional mobility training;Therapeutic activities;Therapeutic exercise;Balance training;Patient/family education    PT Goals (Current goals can be found in the Care Plan section)  Acute Rehab PT Goals Patient Stated Goal: to get better PT Goal Formulation: With patient Time For Goal Achievement: 02/10/24 Potential to Achieve Goals: Fair    Frequency Min 2X/week     Co-evaluation               AM-PAC PT 6 Clicks Mobility  Outcome Measure Help needed turning from your back to your side while in a flat bed without using bedrails?: A Little Help needed moving from lying on your back to sitting on the side of a flat bed without using bedrails?: A Little Help needed moving to and from a bed to a chair (including a wheelchair)?: A Little Help needed standing up from a chair using your arms (e.g., wheelchair or bedside chair)?: A Little Help needed to walk in hospital room?: A Little Help needed climbing 3-5 steps with a railing? : A Lot 6 Click Score: 17    End of Session Equipment Utilized During Treatment: Gait belt Activity Tolerance: Patient limited by pain;Patient limited by fatigue Patient left: in bed;Other (comment) (Pt being transported to ultrasound) Nurse Communication:  Mobility status PT Visit Diagnosis: Other abnormalities of gait and mobility (R26.89);Muscle weakness (generalized) (M62.81);Difficulty in walking, not elsewhere classified (R26.2);Pain Pain - part of body:  (abdomen)    Time: 9080-9051 PT Time Calculation (min) (ACUTE ONLY): 29 min   Charges:   PT Evaluation $PT Eval Low Complexity: 1 Low PT Treatments $Gait Training: 8-22 mins PT General Charges $$ ACUTE PT VISIT: 1 Visit         Janell Axe, SPT

## 2024-01-27 NOTE — Plan of Care (Signed)
  Problem: Coping: Goal: Ability to adjust to condition or change in health will improve Outcome: Progressing   Problem: Fluid Volume: Goal: Ability to maintain a balanced intake and output will improve Outcome: Progressing   Problem: Health Behavior/Discharge Planning: Goal: Ability to manage health-related needs will improve Outcome: Progressing   Problem: Metabolic: Goal: Ability to maintain appropriate glucose levels will improve Outcome: Progressing   Problem: Nutritional: Goal: Maintenance of adequate nutrition will improve Outcome: Progressing Goal: Progress toward achieving an optimal weight will improve Outcome: Progressing   Problem: Skin Integrity: Goal: Risk for impaired skin integrity will decrease Outcome: Progressing   Problem: Tissue Perfusion: Goal: Adequacy of tissue perfusion will improve Outcome: Progressing   Problem: Education: Goal: Knowledge of General Education information will improve Description: Including pain rating scale, medication(s)/side effects and non-pharmacologic comfort measures Outcome: Progressing   Problem: Clinical Measurements: Goal: Will remain free from infection Outcome: Progressing Goal: Respiratory complications will improve Outcome: Progressing Goal: Cardiovascular complication will be avoided Outcome: Progressing   Problem: Activity: Goal: Risk for activity intolerance will decrease Outcome: Progressing

## 2024-01-28 ENCOUNTER — Inpatient Hospital Stay

## 2024-01-28 DIAGNOSIS — T426X4A Poisoning by other antiepileptic and sedative-hypnotic drugs, undetermined, initial encounter: Secondary | ICD-10-CM

## 2024-01-28 DIAGNOSIS — K529 Noninfective gastroenteritis and colitis, unspecified: Secondary | ICD-10-CM | POA: Diagnosis not present

## 2024-01-28 DIAGNOSIS — N179 Acute kidney failure, unspecified: Secondary | ICD-10-CM | POA: Diagnosis not present

## 2024-01-28 DIAGNOSIS — G9341 Metabolic encephalopathy: Secondary | ICD-10-CM

## 2024-01-28 DIAGNOSIS — G929 Unspecified toxic encephalopathy: Secondary | ICD-10-CM | POA: Diagnosis not present

## 2024-01-28 DIAGNOSIS — N182 Chronic kidney disease, stage 2 (mild): Secondary | ICD-10-CM

## 2024-01-28 DIAGNOSIS — R4182 Altered mental status, unspecified: Secondary | ICD-10-CM | POA: Diagnosis not present

## 2024-01-28 LAB — GLUCOSE, CAPILLARY
Glucose-Capillary: 115 mg/dL — ABNORMAL HIGH (ref 70–99)
Glucose-Capillary: 109 mg/dL — ABNORMAL HIGH (ref 70–99)
Glucose-Capillary: 107 mg/dL — ABNORMAL HIGH (ref 70–99)
Glucose-Capillary: 82 mg/dL (ref 70–99)
Glucose-Capillary: 78 mg/dL (ref 70–99)

## 2024-01-28 LAB — BLOOD GAS, VENOUS
Acid-base deficit: 1.7 mmol/L (ref 0.0–2.0)
Bicarbonate: 24.3 mmol/L (ref 20.0–28.0)
O2 Saturation: 64.4 %
Patient temperature: 37
pCO2, Ven: 45 mmHg (ref 44–60)
pH, Ven: 7.34 (ref 7.25–7.43)
pO2, Ven: 38 mmHg (ref 32–45)

## 2024-01-28 LAB — BASIC METABOLIC PANEL WITH GFR
Anion gap: 8 (ref 5–15)
BUN: 22 mg/dL — ABNORMAL HIGH (ref 6–20)
CO2: 22 mmol/L (ref 22–32)
Calcium: 7 mg/dL — ABNORMAL LOW (ref 8.9–10.3)
Chloride: 104 mmol/L (ref 98–111)
Creatinine, Ser: 1.04 mg/dL — ABNORMAL HIGH (ref 0.44–1.00)
GFR, Estimated: >60 mL/min (ref >60)
Glucose, Bld: 112 mg/dL — ABNORMAL HIGH (ref 70–99)
Potassium: 3.9 mmol/L (ref 3.5–5.1)
Sodium: 133 mmol/L — ABNORMAL LOW (ref 135–145)

## 2024-01-28 LAB — CBC
HCT: 26.6 % — ABNORMAL LOW (ref 36.0–46.0)
Hemoglobin: 9 g/dL — ABNORMAL LOW (ref 12.0–15.0)
MCH: 31.1 pg (ref 26.0–34.0)
MCHC: 33.8 g/dL (ref 30.0–36.0)
MCV: 92 fL (ref 80.0–100.0)
Platelets: 224 K/uL (ref 150–400)
RBC: 2.89 MIL/uL — ABNORMAL LOW (ref 3.87–5.11)
RDW: 14.6 % (ref 11.5–15.5)
WBC: 12.4 K/uL — ABNORMAL HIGH (ref 4.0–10.5)
nRBC: 0 % (ref 0.0–0.2)

## 2024-01-28 LAB — AMMONIA: Ammonia: 23 umol/L (ref 9–35)

## 2024-01-28 LAB — CK: Total CK: 198 U/L (ref 38–234)

## 2024-01-28 LAB — PHOSPHORUS: Phosphorus: 2.1 mg/dL — ABNORMAL LOW (ref 2.5–4.6)

## 2024-01-28 LAB — MAGNESIUM: Magnesium: 2.2 mg/dL (ref 1.7–2.4)

## 2024-01-28 MED ORDER — GABAPENTIN 300 MG PO CAPS
600.0000 mg | ORAL_CAPSULE | Freq: Two times a day (BID) | ORAL | Status: DC
  Administered 2024-01-29 – 2024-02-05 (×15): 600 mg via ORAL
  Filled 2024-01-28 (×15): qty 2

## 2024-01-28 MED ORDER — IOHEXOL 300 MG/ML  SOLN
100.0000 mL | Freq: Once | INTRAMUSCULAR | Status: AC | PRN
  Administered 2024-01-28: 100 mL via INTRAVENOUS

## 2024-01-28 MED ORDER — SODIUM PHOSPHATES 45 MMOLE/15ML IV SOLN
30.0000 mmol | Freq: Once | INTRAVENOUS | Status: AC
  Administered 2024-01-28: 30 mmol via INTRAVENOUS
  Filled 2024-01-28: qty 10

## 2024-01-28 MED ORDER — ENOXAPARIN SODIUM 60 MG/0.6ML IJ SOSY: 0.5000 mg/kg | PREFILLED_SYRINGE | INTRAMUSCULAR | Status: DC

## 2024-01-28 MED ORDER — PAROXETINE HCL 20 MG PO TABS
40.0000 mg | ORAL_TABLET | Freq: Every day | ORAL | Status: DC
  Administered 2024-01-29 – 2024-02-05 (×8): 40 mg via ORAL
  Filled 2024-01-28 (×9): qty 2

## 2024-01-28 MED ORDER — LORAZEPAM 2 MG/ML IJ SOLN
1.0000 mg | Freq: Once | INTRAMUSCULAR | Status: AC | PRN
  Administered 2024-01-28: 1 mg via INTRAVENOUS
  Filled 2024-01-28: qty 1

## 2024-01-28 NOTE — Progress Notes (Signed)
 Eeg completed.

## 2024-01-28 NOTE — Progress Notes (Signed)
 [facility] SURGICAL PROGRESS NOTE (cpt [id number])  Hospital Day(s): 2.   Interval History: Patient seen and examined. This morning she is unable to provide any reliable history. She arouses briefly to verbal stimuli but does not reliably answer questions. She can follow 1 to 2 simple commands before falling asleep again. She did get Seroquel  and trazodone  late last night. Last narcotics was at 2045 according to [facility]. She did have fever with T-max 101F at 1700 yesterday. She is otherwise unable to participate in history this AM. Leukocytosis worsening this AM; now 12.4K. Hgb down-trending; 9.0; suspect this is likely dilutional. Renal function improving; sCr - 1.04; UO - 600 ccs. She has been incontinent of stool. She continues on Zosyn .   Review of Systems:  Unable to reliably perform this AM secondary to AMS  Vital signs in last 24 hours: [min-max] current  Temp:  [98.2 F (36.8 C)-101 F (38.3 C)] 98.7 F (37.1 C) (11/20 0658) Pulse Rate:  [73-106] 73 (11/20 0658) Resp:  [14-20] 16 (11/20 0658) BP: (77-157)/(39-128) 99/56 (11/20 0658) SpO2:  [93 %-99 %] 97 % (11/20 0658)     Height: 5' 3 (160 cm) Weight: 92.9 kg BMI (Calculated): 36.29   Intake/Output last 2 shifts:  11/19 0701 - 11/20 0700 In: -  Out: 600 [Urine:600]   Physical Exam:  Constitutional: somnolent., briefly arouses to verbal and painful stimuli but quickly falls asleep. She does not reliably participate in interview/follow commands. Continued jerking movements of upper extremities  HENT: normocephalic without obvious abnormality  Eyes: PERRL, EOM's grossly intact and symmetric  Respiratory: breathing non-labored at rest; on [place] [facility]: regular rate and sinus rhythm  Gastrointestinal: soft, despite altered mental status she does wince with abdominal palpation, worse on the right.    Labs:     Latest Ref Rng & Units 01/28/2024    5:54 AM 01/27/2024    6:24 AM 01/26/2024    6:48 AM  CBC  WBC  4.0 - 10.5 K/uL 12.4  11.5  9.6   Hemoglobin 12.0 - 15.0 g/dL 9.0  89.2  86.3   Hematocrit 36.0 - 46.0 % 26.6  32.5  42.5   Platelets 150 - 400 K/uL 224  266  309       Latest Ref Rng & Units 01/28/2024    5:54 AM 01/27/2024    6:24 AM 01/26/2024    6:48 AM  CMP  Glucose 70 - 99 mg/dL 887  856  845   BUN 6 - 20 mg/dL 22  30  28    Creatinine 0.44 - 1.00 mg/dL 8.95  8.46  8.21   Sodium 135 - 145 mmol/L 133  131  134   Potassium 3.5 - 5.1 mmol/L 3.9  4.3  5.2   Chloride 98 - 111 mmol/L 104  101  103   CO2 22 - 32 mmol/L 22  21  18    Calcium  8.9 - 10.3 mg/dL 7.0  7.2  7.9      Imaging studies: No new pertinent imaging studies   Assessment/Plan: 58 y.o. female with diffuse abdominal pain and diarrhea found to have colitis, complicated by pertinent comorbidities including extensive previous abdominal surgery.   - Patient quite altered this morning with unusual movements of upper extremities. She does arouse briefly but unable to reliably follow commands. She did receive Seroquel  and trazodone  late in the evening so this certainly may be polypharmacy. She does also have a reported history of Tardive Dyskinesia from psychiatric medications, which  certainly may be source of this. Narcotics last given at 2045 yesterday so do not suspect this is the culprit. She does follow a few very simple commands, so acute stroke seems less likely; however, we will get STAT CT Head. Discussed with medicine MD and bedside RN as well.   - From an abdominal perspective, she continues to have significant abdominal pain despite altered mental status now with fever in last 24 hours and continued worsening leukocytosis. We will get STAT CT Abdomen/Pelvis this morning to reassess intra-abdominal process. Ideally, would like this with PO contrast however suspect that is not feasible given current mental status.   - I will back her down to NPO for now; Okay sips with medications  - We will follow up results of imaging.  Unfortunately, surgery in this setting would require subtotal colectomy which would be extremely morbid and and carry significant risk in this patient especially given her numerous previous abdominal procedures.  - Continue IV Abx (Zosyn ) - Continue IVF resuscitation; renewed fluids for another 24 hours              - Monitor abdominal examination - Monitor renal function; improving - Monitor leukocytosis; worsening  - Monitor fever curve - Further management per primary service; we will follow     All of the above findings and recommendations were discussed with the patient, and all of patient's questions were answered to her expressed satisfaction.  Face-to-face time spent with the patient and care providers was 60 minutes, with more than 50% of the time spent counseling, educating, and coordinating care of the patient.    -- Arthea Platt, PA-C [facility] 01/28/2024, 7:49 AM M-F: 7am - 4pm

## 2024-01-28 NOTE — Consult Note (Signed)
 [facility] Psychiatric Consult Initial  Patient Name: .Diana Quinn  MRN: [id number]  DOB: [birth year]  Consult Order details:  Orders (From admission, onward)     Start     Ordered   01/28/24 0812  IP CONSULT TO PSYCHIATRY       Ordering Provider: Von Bellis, MD  Provider:  Donnelly Mellow, MD  Question Answer Comment  Location [facility]   Reason for Consult? History of bipolar and schizophrenia, polypharmacy.  Patient is drowsy and twitching whole body.  Please eval thanks      01/28/24 9187             Mode of Visit: In person    Psychiatry Consult Evaluation  Service Date: January 28, 2024 LOS:  LOS: 2 days  Chief Complaint Polypharmacy  Primary Psychiatric Diagnoses   Schizoaffective disorder   Assessment  Patient is a 58 year old female with chronic psychiatric and medical conditions evaluated for polypharmacy and possible extrapyramidal symptoms. Mental status during exam is somnolent with waxing and waning alertness, likely influenced by her current medical presentation and sedating medication load. Primary team has discontinued Seroquel  and trazodone  due to concern for oversedation. She reports involuntary movements at baseline unclear if this is an exacerbation, notes home benzodiazepines have no effect on involuntary movements. . She denies SI, HI, and AVH, and no behavioral concerns or signs of acute psychiatric decompensation are observed. There are no acute psychiatric concerns at this time. Neuro is on board and EEG is pending. Attempted to further assess later in the day and patient had received ativan  and was sedated. We will continue to work to assess. Given no acute psychiatric condition will defer med management and further work up to primary team. Would also recommend discontinue Ambien . Defer to Neurology recommendation to D/c klonopin  in favor of ativan . Delirium precautions.      Diagnoses:  Active Hospital  problems: Principal Problem:   Acute colitis Active Problems:   Schizophrenia (HCC)   Essential hypertension   Seizure (HCC)   HLD (hyperlipidemia)   CAD (coronary artery disease)   Hypothyroidism   Depression with anxiety   Bipolar disorder (HCC)   Atrial fibrillation, chronic (HCC)   Hyperkalemia   AKI (acute kidney injury)   Type II diabetes mellitus (HCC)    Plan   ## Psychiatric Medication Recommendations:  No acute psychiatric concerns primary team already DC seroquel  and trazodone . Given medical acuity we will continue to follow can adjust pt medications when more medically stable.   ## Medical Decision Making Capacity: Not specifically addressed in this encounter  ## Further Work-up:   -- most recent EKG on 01/26/24 had QtC of 429 -- Pertinent labwork reviewed   ## Disposition:-- There are no psychiatric contraindications to discharge at this time  ## Behavioral / Environmental: -Delirium Precautions: Delirium Interventions for Nursing and Staff: - RN to open blinds every AM. - To Bedside: Glasses, hearing aide, and pt's own shoes. Make available to patients. when possible and encourage use. - Encourage po fluids when appropriate, keep fluids within reach. - OOB to chair with meals. - Passive ROM exercises to all extremities with AM & PM care. - RN to assess orientation to person, time and place QAM and PRN. - Recommend extended visitation hours with familiar family/friends as feasible. - Staff to minimize disturbances at night. Turn off television when pt asleep or when not in use.    ## Safety and Observation Level:  - Based on my  clinical evaluation, I estimate the patient to be at low risk of self harm in the current setting. CSSR Risk Category:C-SSRS RISK CATEGORY: No Risk   Thank you for this consult request. Recommendations have been communicated to the primary team.  We will continue to round on patient at this time.   Donnice FORBES Right, PA-C        History of Present Illness  Relevant Aspects of [facility]   Patient Report:  Patient is a 58 year old female with a medical history significant for hypertension, hyperlipidemia, type II diabetes mellitus, coronary artery disease, hypothyroidism, depression with anxiety, bipolar disorder, schizophrenia, CKD stage II, atrial fibrillation not on anticoagulation, seizure disorder, and tardive dyskinesia. She presented with abdominal pain and diarrhea. Psychiatry was consulted due to concern for polypharmacy and possible extrapyramidal symptoms.  On interview, patient was somnolent but arousable. She was oriented to self and location. She reports established outpatient psychiatric care and has been maintained on her current medication regimen long-term. She endorses a history of tardive dyskinesia and chronic involuntary movements that she feels are unchanged from baseline. She notes prior trial of Ingrezza but is unsure of its benefit. She reports intermittent as-needed clonazepam  use for anxiety and denies daily use. She describes adherence to prescribed psychiatric medications. She can be a limited historian at times. She denies current suicidal ideation, homicidal ideation, auditory or visual hallucinations, and denies feelings of paranoia.   Psychiatric and Social History  Psychiatric History:  Psychiatric History:  Information collected from chart review   Prev Dx/Sx: See above Current Psych Provider: Yes Home Meds (current): See above Previous Med Trials: None Therapy: Yes   Prior Psych Hospitalization: Yes Prior Self Harm: Denies Prior Violence: Denies   Family Psych History: Denies Family Hx suicide: Denies   Social History:  Developmental Hx: Normal Educational Hx: Some college Occupational Hx: Unknown Legal Hx: Denies Living Situation: Lives alone Spiritual Hx: God Access to weapons/lethal means: Denies   Substance History Alcohol: History of alcohol use  disorder Type of alcohol none Last Drink unknown Number of drinks per day NA History of alcohol withdrawal seizures denies, endorses seizure history  History of DT's denies Tobacco: Denies Illicit drugs: Denies Prescription drug abuse: Denies Rehab hx: Yes Exam Findings  Physical Exam: Reviewed and agree with the physical exam findings conducted by the medical provider Vital Signs:  Temp:  [98.2 F (36.8 C)-101 F (38.3 C)] 98.8 F (37.1 C) (11/20 1112) Pulse Rate:  [73-106] 86 (11/20 1112) Resp:  [16-20] 16 (11/20 1112) BP: (77-157)/(50-128) 117/80 (11/20 1112) SpO2:  [94 %-98 %] 94 % (11/20 1112) Blood pressure 117/80, pulse 86, temperature 98.8 F (37.1 C), resp. rate 16, height 5' 3 (1.6 m), weight 92.9 kg, SpO2 94%. Body mass index is 36.28 kg/m.    Mental Status Exam: General Appearance: Disheveled  Orientation:  Other:  varies  Memory:  fluctuates   Concentration:  Concentration: Fair  Recall:  Poor  Attention  Fair  Eye Contact:  Minimal  Speech:  Normal Rate  Language:  Fair  Volume:  Decreased  Mood: euthymic  Affect:  Congruent  Thought Process:  circumstantial   Thought Content:  WDL  Suicidal Thoughts:  No  Homicidal Thoughts:  No  Judgement:  Impaired  Insight:  Shallow  Psychomotor Activity:  Increased     Assets:  Desire for Improvement  Cognition:  Impaired,  Moderate  ADL's:  Impaired  AIMS (if indicated):        Other  History   These have been pulled in through the EMR, reviewed, and updated if appropriate.  Family History:  The patient's family history includes Asthma in her sister; COPD in her mother; Cancer in her mother; Epilepsy in her brother; Hypertension in her mother; Psoriasis in her sister.  Medical History: Past Medical History:  Diagnosis Date   Abdominal pain, acute, right upper quadrant 12/12/2014   Abnormal weight loss 05/10/2014   Acute metabolic encephalopathy 09/23/2022   Allergic reaction 09/17/2016   AMS  (altered mental status) 06/12/2023   Anemia    Ankle fracture 09/10/2022   Appetite loss 01/14/2015   Bilateral ankle fractures 09/25/2022   CAD (coronary artery disease) 08/10/2020   Closed fracture of left distal tibia 09/10/2022   Closed torus fracture of upper end of right fibula 04/13/2019   E. coli UTI 09/18/2022   EKG, abnormal 02/27/2013   Elevated lithium  level 05/10/2014   Gastric bypass status for obesity    H/O unstable angina    History of seizure 09/24/2022   Hx SBO 09/30/2014   Hypoglycemia    Hypokalemia 10/03/2022   Hyponatremia 09/17/2022   MI (myocardial infarction) [facility])    Migraine headache 03/24/2011   Note: Unchanged - sees [facility] neurology, Dr. German  Note: Unchanged - sees [facility] neurolog[facility], Dr. German Deal of this note might be different from the original.  Note: Unchanged - sees [facility] neurology, Dr. German     Occipital headache (Fourth Area of Pain) (Bilateral) (L>R) 03/31/2018   Orthostatic hypotension    Osteoporosis    PEG tube malfunction (HCC) 01/13/2015   Poisoning by unspecified drugs, medicaments and biological substances, accidental (unintentional), initial encounter 02/27/2013   Polysubstance abuse (HCC) 11/30/2016   SBO (small bowel obstruction) (HCC) 09/23/2014   Sciatic nerve disease    Seizures (HCC)    Skin infection at gastrostomy tube site (HCC) 12/29/2014   Suicidal behavior 02/27/2013   Thyroid  disease    TIA (transient ischemic attack) 04/22/2017    Surgical History: Past Surgical History:  Procedure Laterality Date   ABDOMINAL ADHESION SURGERY     ABDOMINAL HYSTERECTOMY     CHOLECYSTECTOMY     DILATION AND CURETTAGE OF UTERUS     ESOPHAGOGASTRODUODENOSCOPY     GASTRIC BYPASS     GASTRIC BYPASS OPEN     revision   GASTROSTOMY W/ FEEDING TUBE     HERNIA REPAIR     OOPHORECTOMY     SPLENECTOMY     TONSILLECTOMY       Medications:   Current Facility-Administered Medications:    acetaminophen  (TYLENOL ) tablet  650 mg, 650 mg, Oral, Q6H PRN, Niu, Xilin, MD, 650 mg at 01/27/24 1803   atorvastatin  (LIPITOR) tablet 40 mg, 40 mg, Oral, Daily, Niu, Xilin, MD, 40 mg at 01/28/24 1131   butalbital -acetaminophen -caffeine  (FIORICET ) 50-325-40 MG per tablet 1 tablet, 1 tablet, Oral, Q6H PRN, Niu, Xilin, MD   Chlorhexidine  Gluconate Cloth 2 % PADS 6 each, 6 each, Topical, Daily, Von Bellis, MD, 6 each at 01/28/24 1140   clonazePAM  (KLONOPIN ) tablet 1 mg, 1 mg, Oral, QHS PRN, Niu, Xilin, MD   cyanocobalamin  (VITAMIN B12) injection 1,000 mcg, 1,000 mcg, Intramuscular, Daily, Von Bellis, MD, 1,000 mcg at 01/28/24 1132   [START ON 01/29/2024] gabapentin  (NEURONTIN ) capsule 600 mg, 600 mg, Oral, BID, Von Bellis, MD   hydrALAZINE  (APRESOLINE ) injection 10 mg, 10 mg, Intravenous, Q2H PRN, Niu, Xilin, MD   insulin  aspart (novoLOG ) injection 0-5 Units, 0-5  Units, Subcutaneous, QHS, Niu, Xilin, MD   insulin  aspart (novoLOG ) injection 0-9 Units, 0-9 Units, Subcutaneous, TID WC, Niu, Xilin, MD, 1 Units at 01/27/24 1804   lactated ringers  infusion, , Intravenous, Continuous, Schulz, Zachary R, PA-C   levothyroxine  (SYNTHROID ) tablet 112 mcg, 112 mcg, Oral, Q0600, Niu, Xilin, MD, 112 mcg at 01/28/24 9471   LORazepam  (ATIVAN ) injection 2 mg, 2 mg, Intravenous, Q2H PRN, Niu, Xilin, MD, 2 mg at 01/28/24 1345   lumateperone  tosylate (CAPLYTA ) capsule 42 mg, 42 mg, Oral, Daily, Niu, Xilin, MD, 42 mg at 01/28/24 1137   multivitamin with minerals tablet 1 tablet, 1 tablet, Oral, Daily, Niu, Xilin, MD, 1 tablet at 01/27/24 9160   ondansetron  (ZOFRAN ) injection 4 mg, 4 mg, Intravenous, Q8H PRN, Niu, Xilin, MD   Oxcarbazepine  (TRILEPTAL ) tablet 600 mg, 600 mg, Oral, BID, Niu, Xilin, MD, 600 mg at 01/28/24 1141   oxyCODONE  (Oxy IR/ROXICODONE ) immediate release tablet 5 mg, 5 mg, Oral, Q6H PRN, Niu, Xilin, MD, 5 mg at 01/27/24 2044   pantoprazole  (PROTONIX ) EC tablet 40 mg, 40 mg, Oral, Daily, Niu, Xilin, MD, 40 mg at 01/28/24 1131    [START ON 01/29/2024] PARoxetine  (PAXIL ) tablet 40 mg, 40 mg, Oral, Daily, Von Bellis, MD   piperacillin -tazobactam (ZOSYN ) IVPB 3.375 g, 3.375 g, Intravenous, Q8H, Niu, Xilin, MD, Last Rate: 12.5 mL/hr at 01/28/24 1131, 3.375 g at 01/28/24 1131   ranolazine  (RANEXA ) 12 hr tablet 1,000 mg, 1,000 mg, Oral, BID, Niu, Xilin, MD, 1,000 mg at 01/28/24 1136   saccharomyces boulardii (FLORASTOR) capsule 250 mg, 250 mg, Oral, BID, Von Bellis, MD, 250 mg at 01/28/24 1133   sodium phosphate  30 mmol in sodium chloride  0.9 % 250 mL infusion, 30 mmol, Intravenous, Once, Von Bellis, MD, Last Rate: 43 mL/hr at 01/28/24 1406, 30 mmol at 01/28/24 1406   SUMAtriptan  (IMITREX ) tablet 50 mg, 50 mg, Oral, Q2H PRN, Niu, Xilin, MD   topiramate  (TOPAMAX ) tablet 100 mg, 100 mg, Oral, BID, Niu, Xilin, MD, 100 mg at 01/28/24 1137   Vitamin D  (Ergocalciferol ) (DRISDOL ) 1.25 MG (50000 UNIT) capsule 50,000 Units, 50,000 Units, Oral, Q7 days, Von Bellis, MD, 50,000 Units at 01/27/24 1235   zolpidem  (AMBIEN ) tablet 5 mg, 5 mg, Oral, QHS PRN, Niu, Xilin, MD  Allergies: Allergies  Allergen Reactions   Depakote [Divalproex Sodium] Shortness Of Breath   Pregabalin Other (See Comments), Rash and Shortness Of Breath    Other Reaction: falls; decreased coordination  Pt ts that she passes out when she takes this med.   Other Reaction: falls; decreased coordination    Pt ts that she passes out when she takes this med.  Other Reaction: falls; decreased coordination, , Pt ts that she passes out when she takes this med.   Valproic Acid  Other (See Comments) and Shortness Of Breath    Other Reaction: Other reaction  SOB   Levetiracetam Itching and Rash  I have reviewed this case with Dr. Donnelly who is agreeable with this plan.   Donnice FORBES Right, PA-C

## 2024-01-28 NOTE — Consult Note (Signed)
 NEUROLOGY CONSULT NOTE   Date of service: January 28, 2024 Patient Name: Diana Quinn MRN:  [id number] DOB:  Feb 08, 1966 Chief Complaint: AMS with generalized body twitching Requesting Provider: Von Bellis, MD  History of Present Illness  Sherley Mckenney is a 58 y.o. female with a PMHx of acute metabolic encephalopathy/AMS (July 2024 and April 2025), CAD, UIT, gastric bypass, seizures (on Topamax  and Trileptal  at home), CAD, migraine headaches, occipital headaches, orthostatic hypotension, polysubstance abuse, SBO, sciatic nerve disease, suicidal behavior, thyroid  disease, TIA, depression, anxiety, bipolar disorder, schizophrenia, a-fib (not on anticoagulation, patient denies that she has a-fib) and CKD 2 who was admitted to the hospital on 11/17 with abdominal pain, nausea and diarrhea. She was diagnosed with acute colitis with associated leukocytosis of 17.1 and elevated lactate level. She was started on a pain control regimen as well as Zosyn . Due to sedation, Diaudid was stopped.  She had AKI on admission and Cr has been steadily improving back to her baseline Cr of about 1 (1.78> 1.53>1.04 ).   This morning she was noted to have waxing and waning mentation with generalized body twitching. EEG was ordered and Neurology consulted to review medications for possibility of polypharmacy.   EEG reveals continuous generalized slowing, suggestive of moderate diffuse encephalopathy. No seizures or epileptiform discharges were seen throughout the recording.    ROS  Unable to obtain a reliable ROS due to confusion.   Past History   Past Medical History:  Diagnosis Date   Abdominal pain, acute, right upper quadrant 12/12/2014   Abnormal weight loss 05/10/2014   Acute metabolic encephalopathy 09/23/2022   Allergic reaction 09/17/2016   AMS (altered mental status) 06/12/2023   Anemia    Ankle fracture 09/10/2022   Appetite loss 01/14/2015   Bilateral ankle fractures 09/25/2022   CAD  (coronary artery disease) 08/10/2020   Closed fracture of left distal tibia 09/10/2022   Closed torus fracture of upper end of right fibula 04/13/2019   E. coli UTI 09/18/2022   EKG, abnormal 02/27/2013   Elevated lithium  level 05/10/2014   Gastric bypass status for obesity    H/O unstable angina    History of seizure 09/24/2022   Hx SBO 09/30/2014   Hypoglycemia    Hypokalemia 10/03/2022   Hyponatremia 09/17/2022   MI (myocardial infarction) [facility])    Migraine headache 03/24/2011   Note: Unchanged - sees [facility] neurology, Dr. German  Note: Unchanged - sees [facility] neurolog[facility], Dr. German Deal of this note might be different from the original.  Note: Unchanged - sees [facility] neurolog[facility], Dr. German     Occipital headache (Fourth Area of Pain) (Bilateral) (L>R) 03/31/2018   Orthostatic hypotension    Osteoporosis    PEG tube malfunction (HCC) 01/13/2015   Poisoning by unspecified drugs, medicaments and biological substances, accidental (unintentional), initial encounter 02/27/2013   Polysubstance abuse (HCC) 11/30/2016   SBO (small bowel obstruction) (HCC) 09/23/2014   Sciatic nerve disease    Seizures (HCC)    Skin infection at gastrostomy tube site (HCC) 12/29/2014   Suicidal behavior 02/27/2013   Thyroid  disease    TIA (transient ischemic attack) 04/22/2017   Past Surgical History:  Procedure Laterality Date   ABDOMINAL ADHESION SURGERY     ABDOMINAL HYSTERECTOMY     CHOLECYSTECTOMY     DILATION AND CURETTAGE OF UTERUS     ESOPHAGOGASTRODUODENOSCOPY     GASTRIC BYPASS     GASTRIC BYPASS OPEN     revision  GASTROSTOMY W/ FEEDING TUBE     HERNIA REPAIR     OOPHORECTOMY     SPLENECTOMY     TONSILLECTOMY      Family History: Family History  Problem Relation Age of Onset   Cancer Mother    Hypertension Mother    COPD Mother    Asthma Sister    Psoriasis Sister    Epilepsy Brother     Social History  reports that she has never smoked. She has never used smokeless  tobacco. She reports that she does not currently use alcohol. She reports that she does not use drugs.  Allergies  Allergen Reactions   Depakote [Divalproex Sodium] Shortness Of Breath   Pregabalin Other (See Comments), Rash and Shortness Of Breath    Other Reaction: falls; decreased coordination  Pt ts that she passes out when she takes this med.   Other Reaction: falls; decreased coordination    Pt ts that she passes out when she takes this med.  Other Reaction: falls; decreased coordination, , Pt ts that she passes out when she takes this med.   Valproic Acid  Other (See Comments) and Shortness Of Breath    Other Reaction: Other reaction  SOB   Levetiracetam Itching and Rash    Medications   Current Facility-Administered Medications:    acetaminophen  (TYLENOL ) tablet 650 mg, 650 mg, Oral, Q6H PRN, Niu, Xilin, MD, 650 mg at 01/27/24 1803   atorvastatin  (LIPITOR) tablet 40 mg, 40 mg, Oral, Daily, Niu, Xilin, MD, 40 mg at 01/27/24 9160   butalbital -acetaminophen -caffeine  (FIORICET ) 50-325-40 MG per tablet 1 tablet, 1 tablet, Oral, Q6H PRN, Niu, Xilin, MD   Chlorhexidine  Gluconate Cloth 2 % PADS 6 each, 6 each, Topical, Daily, Von Bellis, MD, 6 each at 01/27/24 1235   clonazePAM  (KLONOPIN ) tablet 1 mg, 1 mg, Oral, QHS PRN, Niu, Xilin, MD   cyanocobalamin  (VITAMIN B12) injection 1,000 mcg, 1,000 mcg, Intramuscular, Daily, Von Bellis, MD   NOREEN ON 01/29/2024] gabapentin  (NEURONTIN ) capsule 600 mg, 600 mg, Oral, BID, Von Bellis, MD   hydrALAZINE  (APRESOLINE ) injection 10 mg, 10 mg, Intravenous, Q2H PRN, Niu, Xilin, MD   insulin  aspart (novoLOG ) injection 0-5 Units, 0-5 Units, Subcutaneous, QHS, Niu, Xilin, MD   insulin  aspart (novoLOG ) injection 0-9 Units, 0-9 Units, Subcutaneous, TID WC, Niu, Xilin, MD, 1 Units at 01/27/24 1804   lactated ringers  infusion, , Intravenous, Continuous, Schulz, Zachary R, PA-C   levothyroxine  (SYNTHROID ) tablet 112 mcg, 112 mcg, Oral, Q0600,  Niu, Xilin, MD, 112 mcg at 01/28/24 9471   LORazepam  (ATIVAN ) injection 1 mg, 1 mg, Intravenous, Once PRN, Von Bellis, MD   LORazepam  (ATIVAN ) injection 2 mg, 2 mg, Intravenous, Q2H PRN, Niu, Xilin, MD   lumateperone  tosylate (CAPLYTA ) capsule 42 mg, 42 mg, Oral, Daily, Niu, Xilin, MD, 42 mg at 01/27/24 0840   multivitamin with minerals tablet 1 tablet, 1 tablet, Oral, Daily, Niu, Xilin, MD, 1 tablet at 01/27/24 9160   ondansetron  (ZOFRAN ) injection 4 mg, 4 mg, Intravenous, Q8H PRN, Niu, Xilin, MD   Oxcarbazepine  (TRILEPTAL ) tablet 600 mg, 600 mg, Oral, BID, Niu, Xilin, MD, 600 mg at 01/27/24 2207   oxyCODONE  (Oxy IR/ROXICODONE ) immediate release tablet 5 mg, 5 mg, Oral, Q6H PRN, Niu, Xilin, MD, 5 mg at 01/27/24 2044   pantoprazole  (PROTONIX ) EC tablet 40 mg, 40 mg, Oral, Daily, Niu, Xilin, MD, 40 mg at 01/27/24 0839   [START ON 01/29/2024] PARoxetine  (PAXIL ) tablet 40 mg, 40 mg, Oral, Daily, Von Bellis, MD  piperacillin -tazobactam (ZOSYN ) IVPB 3.375 g, 3.375 g, Intravenous, Q8H, Niu, Xilin, MD, Last Rate: 12.5 mL/hr at 01/28/24 0043, 3.375 g at 01/28/24 0043   ranolazine  (RANEXA ) 12 hr tablet 1,000 mg, 1,000 mg, Oral, BID, Niu, Xilin, MD, 1,000 mg at 01/27/24 2207   saccharomyces boulardii (FLORASTOR) capsule 250 mg, 250 mg, Oral, BID, Von Bellis, MD, 250 mg at 01/27/24 2208   sodium phosphate  30 mmol in sodium chloride  0.9 % 250 mL infusion, 30 mmol, Intravenous, Once, Von Bellis, MD   SUMAtriptan  (IMITREX ) tablet 50 mg, 50 mg, Oral, Q2H PRN, Niu, Xilin, MD   topiramate  (TOPAMAX ) tablet 100 mg, 100 mg, Oral, BID, Niu, Xilin, MD, 100 mg at 01/27/24 2208   Vitamin D  (Ergocalciferol ) (DRISDOL ) 1.25 MG (50000 UNIT) capsule 50,000 Units, 50,000 Units, Oral, Q7 days, Von Bellis, MD, 50,000 Units at 01/27/24 1235   zolpidem  (AMBIEN ) tablet 5 mg, 5 mg, Oral, QHS PRN, Hilma Rankins, MD  Vitals   Vitals:   01/27/24 2017 01/27/24 2145 01/28/24 0335 01/28/24 0658  BP: (!) 108/50 (!) 143/128  (!) 97/57 (!) 99/56  Pulse: 93 98 92 73  Resp: 20 20 17 16   Temp: 98.2 F (36.8 C) 98.6 F (37 C) 98.4 F (36.9 C) 98.7 F (37.1 C)  TempSrc:  Oral  Oral  SpO2: 94% 95% 97% 97%  Weight:      Height:        Body mass index is 36.28 kg/m.   Physical Exam   Constitutional: Obese. Appears disheveled. NAD.  Psych: Hypomanic. Pressured and tangential speech.  Eyes: No scleral injection.  HENT: No OP obstruction. Xerostomia is noted. Dried mucus on lips.  Head: Normocephalic.  Respiratory: Effort normal, non-labored breathing.  Skin: WDI.   Neurologic Examination   Mental Status: Awake and alert. Moderate psychomotor agitation noted. Confused and poorly oriented to time, but knows that she is in a [facility] facility in [place], [place]. Tangential speech and perseverates frequently during testing of her orientation. Speech is fluent without errors of grammar or syntax. Able to follow all basic commands, but has significant difficulty and confusion with complex commands. Mild dysarthria in the context of her xerostomia. .   Cranial Nerves: II: Temporal visual fields intact when tested separately. Becomes confused when examiner attempts to test for DSS. PERRL. III,IV, VI: No ptosis. EOMI but with low-amplitude erratic oscillations of the globes during upgaze. No nystagmus. V: Temp sensation decreased on the left VII: Smile symmetric VIII: Hearing intact to voice IX,X: No hypophonia or hoarseness XI: Symmetric XII: Midline tongue extension Motor: RUE: 4+/5 LUE: 4+/5 RLE: 4+/5 LLE: 4+/5 Sensory: Decreased temperature sensation to RUE, normal subjective temp sensation to LUE and BLE. No extinction to DSS. Deep Tendon Reflexes: 1+ and symmetric bilateral biceps, brachioradialis and patellae Plantars: Right: downgoingLeft: downgoing Cerebellar: No ataxia with FNF bilaterally, but slow and requires repeated commands to perform. Mild action tremor noted. Also seen is low-amplitude  asterixis of bilateral outstretched arms as well as with elevation of lower extremities antigravity.  Gait: Deferred   Labs/Imaging/Neurodiagnostic studies   CBC:  Recent Labs  Lab 2024/02/07 2033 01/26/24 0648 01/27/24 0624 01/28/24 0554  WBC 17.1*   < > 11.5* 12.4*  NEUTROABS 13.9*  --   --   --   HGB 14.6   < > 10.7* 9.0*  HCT 45.3   < > 32.5* 26.6*  MCV 96.8   < > 95.6 92.0  PLT 426*   < > 266 224   < > =  values in this interval not displayed.   Basic Metabolic Panel:  Lab Results  Component Value Date   NA 133 (L) 01/28/2024   K 3.9 01/28/2024   CO2 22 01/28/2024   GLUCOSE 112 (H) 01/28/2024   BUN 22 (H) 01/28/2024   CREATININE 1.04 (H) 01/28/2024   CALCIUM  7.0 (L) 01/28/2024   GFRNONAA >60 01/28/2024   GFRAA 59 (L) 05/19/2019   Lipid Panel:  Lab Results  Component Value Date   LDLCALC 35 11/17/2023   HgbA1c:  Lab Results  Component Value Date   HGBA1C 6.0 (H) 11/17/2023   Urine Drug Screen:     Component Value Date/Time   LABOPIA NEGATIVE 01/26/2024 0248   COCAINSCRNUR NEGATIVE 01/26/2024 0248   COCAINSCRNUR NONE DETECTED 06/12/2023 1028   LABBENZ NEGATIVE 01/26/2024 0248   AMPHETMU NEGATIVE 01/26/2024 0248   THCU NEGATIVE 01/26/2024 0248   LABBARB NEGATIVE 01/26/2024 0248    Alcohol Level     Component Value Date/Time   ETH <10 06/12/2023 1029   INR  Lab Results  Component Value Date   INR 1.1 01/26/2024   APTT  Lab Results  Component Value Date   APTT 26 01/26/2024     ASSESSMENT  58 y.o. female with a PMHx of acute metabolic encephalopathy/AMS (July 2024 and April 2025), CAD, UIT, gastric bypass, seizures (on Topamax  and Trileptal  at home), CAD, migraine headaches, occipital headaches, orthostatic hypotension, polysubstance abuse, SBO, sciatic nerve disease, suicidal behavior, thyroid  disease, TIA, depression, anxiety, bipolar disorder, schizophrenia, a-fib (not on anticoagulation, patient denies that she has a-fib) and CKD 2 who was  admitted to the hospital on 11/17 with abdominal pain, nausea and diarrhea. She was diagnosed with acute colitis with associated leukocytosis of 17.1 and elevated lactate level. She was started on a pain control regimen as well as Zosyn . Due to sedation, Diaudid was stopped.  She had AKI on admission and Cr has been steadily improving back to her baseline Cr of about 1 (1.78> 1.53>1.04 ). This morning she was noted to have waxing and waning mentation with generalized body twitching. EEG was ordered and Neurology consulted to review medications for possibility of polypharmacy.  - Exam reveals findings most consistent with an agitated delirium (agitation is mild). Also noted is asterixis. Most likely etiology is toxic versus metabolic or a combination of the two.  - EEG reveals continuous generalized slowing, suggestive of moderate diffuse encephalopathy. No seizures or epileptiform discharges were seen throughout the recording.  - CT head: There is no evidence of an acute infarct, intracranial hemorrhage, mass, midline shift, hydrocephalus, or extra-axial fluid collection. Cerebral volume is within normal limits for age. - Most recent labs: WBC 12.4. Na 133, K normal, glucose 112, BUN 22, Cr 1.04, calcium  7.0, phosphorus 2.1. Mg normal. Total Bili normal. AST and ALT normal. Ammonia normal. Lipase normal. Albumin low at 2.7.  - Impression:  - Acute toxic/metabolic encephalopathy. Infection likely playing a role. Overall presentation is not consistent with a meningitis.  - Asterixis. Most likely due to Neurontin  toxicity due to high dosage regimen in the setting of AKI. Neurontin  likely a significant contributor to her AMS.  - Polypharmacy. Also likely contributing to her AMS.   RECOMMENDATIONS  - Discontinue Zolpidem  - On admission, Neurontin  had been decreased to 600 mg BID from  her listed home dose of 600 mg QID. For now, would completely hold off on any further Neurontin  doses until her renal function  improves back to baseline + an additional several  days' washout period. When Neurontin  is restarted, most likely will need to be prescribed at a significantly lower dose than her home dose. 300 mg BID would be a good place to start.  - Discontinue her PRN Klonopin . Use Ativan  as her PRN benzodiazepine.  - Continue her home Topamax  and Trileptal  at the current doses.  - Minimize opioid medications.  - Avoid medications with anticholinergic side effects - IV hydration  ______________________________________________________________________    Bonney SHARK, Malea Swilling, MD [facility]

## 2024-01-28 NOTE — Procedures (Addendum)
 Patient Name: Diana Quinn  MRN: [id number]  Epilepsy Attending: Pastor Falling  Referring Physician/Provider: No ref. provider found      Date: 01/28/2024 Duration:   Patient history: 58 year old woman with altered mental status, with generalized body twitching.  Level of alertness: lethargic   AEDs during EEG study: Clonazepam , Gabapentin , Topiramate , Oxcarbazepine    Technical aspects: This EEG study was done with scalp electrodes positioned according to the 10-20 International system of electrode placement. Electrical activity was reviewed with band pass filter of 1-70Hz , sensitivity of 7 uV/mm, display speed of 23mm/sec with a 60Hz  notched filter applied as appropriate. EEG data were recorded continuously and digitally stored.  Video monitoring was available and reviewed as appropriate.  Description: EEG showed continuous generalized polymorphic sharply contoured 3 to 6 Hz theta-delta slowing. Hyperventilation and photic stimulation were not performed.     ABNORMALITY - Continuous slow, generalized   IMPRESSION:  This study is suggestive of moderate diffuse encephalopathy, nonspecific etiology but likely related to sedation, toxic-metabolic etiology. No seizures or epileptiform discharges were seen throughout the recording.   Pastor Falling MD Neurology

## 2024-01-28 NOTE — Plan of Care (Signed)

## 2024-01-28 NOTE — Progress Notes (Signed)
 Progress Note   Patient: Diana Quinn FMW:[id number] DOB: 12-22-1965 DOA: 01/25/2024     2 DOS: the patient was seen and examined on 01/28/2024   Brief hospital course: Partly taken from prior notes.  Diana Quinn is a 58 y.o. female with medical history significant of HTN, HLD, DM, CAD, hypothyroidism, depression with anxiety, bipolar, schizophrenia, CKD stage II, A-fib not on anticoagulants, seizure, who presents with abdominal pain, diarrhea.   On presentation hemodynamically stable, labs with WBC of 17.1, worsening renal function, lactic acid 2.1, lipase 14.   pseudo hyponatremia secondary to hyperglycemia. CT abdomen and pelvis consistent with colitis involving the ascending, transverse and descending colon, without pneumatosis or perforation. Also noted appendiceal wall thickening and a chronic appearing L1 compression fracture.  General surgery was also consulted and they do not think that she has any concern of acute appendicitis and recommending conservative management.  She was started on Zosyn .  Cultures were ordered.  11/18: Vital stable, leukocytosis resolved, potassium of 5.2, CO2 18, lactic acid 2.0, BUN 28, creatinine 1.78, UDS negative, preliminary blood cultures negative in 12 hours. Patient did not had any more diarrhea, C. difficile and GI pathogen ordered but never collected. Discontinuing Dilaudid  as causing a lot of sedation.  PT was also ordered.  Family has more social concern related to her.  Message sent to social worker to visit her tomorrow in the presence of son.  Assessment and Plan:  # Acute colitis - Negative C. difficile and GI pathogen panel -Continue with Zosyn   -Advance diet as tolerated -Discontinue Dilaudid  -Continue with p.o. pain management -Avoid narcotics as possible Continue IV fluid for hydration Started probiotics 11/20 follow repeat CT scan abdomen as ordered by general surgery  AKI (acute kidney injury) Creatinine  1.78> 1.53>1.04 Baseline sCr around 1.  Likely secondary to dehydration with GI losses. - Continue IV fluid -Monitor renal function -Avoid nephrotoxins  # Urinary retention US  renal: No acute renal abnormality identified to explain acute kidney injury.  Bladder scan showed 575 mL 11/19, Foley catheter was inserted  Atrial fibrillation, chronic (HCC) Mild tachycardia.  Not  on any anticoagulation. - Continue to monitor  CAD (coronary artery disease) Patient has a history of CAD.  No chest pain. -Continue home Lipitor, Ranexa   Seizure (HCC) -Continue home Topamax  and Trileptal  Continued gabapentin  -Seizure precautions 11/20 patient is having waxing and waning altered mental status with generalized body twitching. Order placed for EEG Neurology consulted to review medications for possibility of polypharmacy   Hypothyroidism - Continue home Synthroid   Essential hypertension Blood pressure on lower normal goal. Not on any medications at home. - Continue to monitor  Hyperkalemia Potassium of 5.2>> 4.3 - Getting some IV fluid -Ordered for Lokelma  -Continue to monitor  # Hypophosphatemia, Phos repleted Monitor electrolytes and replete as needed    Type II diabetes mellitus (HCC) CBG within goal. - SSI  HLD (hyperlipidemia) - Continue Lipitor  Depression with anxiety History of bipolar disorder and Schizophrenia. - Continue home medications which include Paxil , as needed clonazepam , trazodone , Seroquel  and Caplyta . 11/20 discontinued trazodone  and Seroquel  as patient was very sleepy and dozing off, having significant twitching of whole body. Psych consulted to review medications and reduce polypharmacy   # Vitamin D  insufficiency, vitamin D  level 22, started vitamin D  55 units p.o. weekly  # History of vitamin B12 deficiency on B12 monthly injection. B12 level 370, goal 400, started B12 injection during hospital stay for   Subjective: No significant event  overnight, patient having generalized body twitching otherwise no new complaints, abdominal pain is better.   Physical Exam: Vitals:   01/27/24 2145 01/28/24 0335 01/28/24 0658 01/28/24 1112  BP: (!) 143/128 (!) 97/57 (!) 99/56 117/80  Pulse: 98 92 73 86  Resp: 20 17 16 16   Temp: 98.6 F (37 C) 98.4 F (36.9 C) 98.7 F (37.1 C) 98.8 F (37.1 C)  TempSrc: Oral  Oral   SpO2: 95% 97% 97% 94%  Weight:      Height:       General.  NAD, lying comfortably Pulmonary.  Lungs clear bilaterally, normal respiratory effort. CV.  Regular rate and rhythm, no JVD, rub or murmur. Abdomen.  Soft, BS positive, ND, generalized tenderness  CNS: Awake and alert today. No focal neurologic deficit.  Generalized body twitching noticed Extremities.  No edema  Data Reviewed: Prior data reviewed  Family Communication: Discussed with patient  Disposition: Status is: Inpatient Remains inpatient appropriate because: Severity of illness  Planned Discharge Destination: Home  DVT prophylaxis.  Subcu heparin   Time spent: 55 minutes  This record has been created using Conservation officer, historic buildings. Errors have been sought and corrected,but may not always be located. Such creation errors do not reflect on the standard of care.    Author: Elvan Sor, MD 01/28/2024 4:45 PM  For on call review [email or web address].

## 2024-01-29 DIAGNOSIS — K529 Noninfective gastroenteritis and colitis, unspecified: Secondary | ICD-10-CM | POA: Diagnosis not present

## 2024-01-29 LAB — BASIC METABOLIC PANEL WITH GFR
Anion gap: 12 (ref 5–15)
BUN: 12 mg/dL (ref 6–20)
CO2: 17 mmol/L — ABNORMAL LOW (ref 22–32)
Calcium: 7.5 mg/dL — ABNORMAL LOW (ref 8.9–10.3)
Chloride: 105 mmol/L (ref 98–111)
Creatinine, Ser: 0.9 mg/dL (ref 0.44–1.00)
GFR, Estimated: >60 mL/min (ref >60)
Glucose, Bld: 105 mg/dL — ABNORMAL HIGH (ref 70–99)
Potassium: 4 mmol/L (ref 3.5–5.1)
Sodium: 134 mmol/L — ABNORMAL LOW (ref 135–145)

## 2024-01-29 LAB — CBC
HCT: 27.9 % — ABNORMAL LOW (ref 36.0–46.0)
Hemoglobin: 9.5 g/dL — ABNORMAL LOW (ref 12.0–15.0)
MCH: 31.3 pg (ref 26.0–34.0)
MCHC: 34.1 g/dL (ref 30.0–36.0)
MCV: 91.8 fL (ref 80.0–100.0)
Platelets: 262 K/uL (ref 150–400)
RBC: 3.04 MIL/uL — ABNORMAL LOW (ref 3.87–5.11)
RDW: 14.7 % (ref 11.5–15.5)
WBC: 12.4 K/uL — ABNORMAL HIGH (ref 4.0–10.5)
nRBC: 0.2 % (ref 0.0–0.2)

## 2024-01-29 LAB — GLUCOSE, CAPILLARY
Glucose-Capillary: 76 mg/dL (ref 70–99)
Glucose-Capillary: 85 mg/dL (ref 70–99)
Glucose-Capillary: 114 mg/dL — ABNORMAL HIGH (ref 70–99)
Glucose-Capillary: 80 mg/dL (ref 70–99)
Glucose-Capillary: 123 mg/dL — ABNORMAL HIGH (ref 70–99)

## 2024-01-29 LAB — CK: Total CK: 240 U/L — ABNORMAL HIGH (ref 38–234)

## 2024-01-29 LAB — MAGNESIUM: Magnesium: 2.3 mg/dL (ref 1.7–2.4)

## 2024-01-29 LAB — CALPROTECTIN, FECAL: Calprotectin, Fecal: 2350 ug/g — ABNORMAL HIGH (ref 0–120)

## 2024-01-29 LAB — PHOSPHORUS: Phosphorus: 2.3 mg/dL — ABNORMAL LOW (ref 2.5–4.6)

## 2024-01-29 MED ORDER — SODIUM BICARBONATE 8.4 % IV SOLN
50.0000 meq | INTRAVENOUS | Status: AC
  Filled 2024-01-29: qty 50

## 2024-01-29 MED ORDER — SODIUM BICARBONATE 8.4 % IV SOLN: 50.0000 meq | INTRAVENOUS | Status: DC

## 2024-01-29 MED ORDER — SODIUM BICARBONATE 8.4 % IV SOLN
50.0000 meq | INTRAVENOUS | Status: AC
  Administered 2024-01-29 (×2): 50 meq via INTRAVENOUS
  Filled 2024-01-29: qty 50

## 2024-01-29 MED ORDER — K PHOS MONO-SOD PHOS DI & MONO 155-852-130 MG PO TABS
500.0000 mg | ORAL_TABLET | Freq: Three times a day (TID) | ORAL | Status: AC
  Administered 2024-01-29 (×3): 500 mg via ORAL
  Filled 2024-01-29 (×3): qty 2

## 2024-01-29 NOTE — Progress Notes (Signed)
 Progress Note   Patient: Diana Quinn FMW:[id number] DOB: [birth year] DOA: 01/25/2024     3 DOS: the patient was seen and examined on 01/29/2024   Brief hospital course: Partly taken from prior notes.  Amarilys Lyles is a 58 y.o. female with medical history significant of HTN, HLD, DM, CAD, hypothyroidism, depression with anxiety, bipolar, schizophrenia, CKD stage II, A-fib not on anticoagulants, seizure, who presents with abdominal pain, diarrhea.   On presentation hemodynamically stable, labs with WBC of 17.1, worsening renal function, lactic acid 2.1, lipase 14.   pseudo hyponatremia secondary to hyperglycemia. CT abdomen and pelvis consistent with colitis involving the ascending, transverse and descending colon, without pneumatosis or perforation. Also noted appendiceal wall thickening and a chronic appearing L1 compression fracture.  General surgery was also consulted and they do not think that she has any concern of acute appendicitis and recommending conservative management.  She was started on Zosyn .  Cultures were ordered.  11/18: Vital stable, leukocytosis resolved, potassium of 5.2, CO2 18, lactic acid 2.0, BUN 28, creatinine 1.78, UDS negative, preliminary blood cultures negative in 12 hours. Patient did not had any more diarrhea, C. difficile and GI pathogen ordered but never collected. Discontinuing Dilaudid  as causing a lot of sedation.  PT was also ordered.  Family has more social concern related to her.  Message sent to social worker to visit her tomorrow in the presence of son.  Assessment and Plan:  # Acute colitis - Negative C. difficile and GI pathogen panel -Continue with Zosyn   -Advance diet as tolerated -Discontinue Dilaudid  -Continue with p.o. pain management -Avoid narcotics as possible Continue IV fluid for hydration Started probiotics 11/20 repeat CT a/p: Shows colitis, no new acute finding Fecal calprotectin is elevated 2350 GI consulted,  colitis possible inflammatory.  Patient may need colonoscopy in steroids?  AKI (acute kidney injury) Creatinine 1.78> 1.53>1.04 Baseline sCr around 1.  Likely secondary to dehydration with GI losses. - Continue IV fluid -Monitor renal function -Avoid nephrotoxins 11/21 mild metabolic acidosis, CO2 17, bicarbonate IV 100 mEq x 1 dose IV push given.  Check chemistry daily  # Urinary retention US  renal: No acute renal abnormality identified to explain acute kidney injury.  Bladder scan showed 575 mL 11/19, Foley catheter was inserted  Atrial fibrillation, chronic (HCC) Mild tachycardia.  Not  on any anticoagulation. - Continue to monitor  CAD (coronary artery disease) Patient has a history of CAD.  No chest pain. -Continue home Lipitor, Ranexa   Seizure (HCC) -Continue home Topamax  and Trileptal  11/20 held gabapentin  as per Neuro -Seizure precautions 11/20 patient is having waxing and waning altered mental status with generalized body twitching. Order placed for EEG Neurology consulted to review medications for possibility of polypharmacy Neuro recommended to hold off gabapentin  for now in view of renal failure and restarted lower dose.   Hypothyroidism - Continue home Synthroid   Essential hypertension Blood pressure on lower normal goal. Not on any medications at home. - Continue to monitor  Hyperkalemia Potassium of 5.2>> 4.3 - Getting some IV fluid -Ordered for Lokelma  -Continue to monitor  # Hypophosphatemia, Phos repleted Monitor electrolytes and replete as needed   Type II diabetes mellitus (HCC) CBG within goal. - SSI  HLD (hyperlipidemia) - Continue Lipitor  Depression with anxiety History of bipolar disorder and Schizophrenia. - Continue home medications which include Paxil , as needed clonazepam , trazodone , Seroquel  and Caplyta . 11/20 discontinued trazodone  and Seroquel  as patient was very sleepy and dozing off, having significant twitching of whole  body. Psych consulted to review medications and reduce polypharmacy Psych recommended to continue to hold off trazodone  and Seroquel  for now and resume same dose on discharge as it was helping patient.  # Vitamin D  insufficiency, vitamin D  level 22, started vitamin D  55 units p.o. weekly  # History of vitamin B12 deficiency on B12 monthly injection. B12 level 370, goal 400, started B12 injection during hospital stay for   Subjective: No significant event overnight, today patient is more awake and alert, denies any complaints.  Abdominal pain is getting better, denies any diarrhea today   Physical Exam: Vitals:   01/28/24 1754 01/28/24 2105 01/29/24 0503 01/29/24 0833  BP: 102/60 108/64 106/65 117/85  Pulse: 75 92 86 87  Resp: 18 17 16 18   Temp: 98.2 F (36.8 C) 98.4 F (36.9 C) 98.3 F (36.8 C) 99.2 F (37.3 C)  TempSrc:  Oral Oral Oral  SpO2: 98% 100% 96% 97%  Weight:      Height:       General.  NAD, lying comfortably Pulmonary.  Lungs clear bilaterally, normal respiratory effort. CV.  Regular rate and rhythm, no JVD, rub or murmur. Abdomen.  Soft, BS positive, ND, generalized tenderness  CNS: Awake and alert today. No focal neurologic deficit.  Generalized body twitching noticed Extremities.  No edema  Data Reviewed: Prior data reviewed  Family Communication: Discussed with patient  Disposition: Status is: Inpatient Remains inpatient appropriate because: Severity of illness  Planned Discharge Destination: Home  DVT prophylaxis.  Subcu heparin   Time spent: 55 minutes  This record has been created using Conservation officer, historic buildings. Errors have been sought and corrected,but may not always be located. Such creation errors do not reflect on the standard of care.    Author: Elvan Sor, MD 01/29/2024 4:13 PM  For on call review [email or web address].

## 2024-01-29 NOTE — Plan of Care (Signed)
  Problem: Fluid Volume: Goal: Ability to maintain a balanced intake and output will improve Outcome: Progressing   Problem: Metabolic: Goal: Ability to maintain appropriate glucose levels will improve Outcome: Progressing   Problem: Skin Integrity: Goal: Risk for impaired skin integrity will decrease Outcome: Progressing   Problem: Tissue Perfusion: Goal: Adequacy of tissue perfusion will improve Outcome: Progressing   Problem: Clinical Measurements: Goal: Ability to maintain clinical measurements within normal limits will improve Outcome: Progressing Goal: Diagnostic test results will improve Outcome: Progressing Goal: Respiratory complications will improve Outcome: Progressing Goal: Cardiovascular complication will be avoided Outcome: Progressing   Problem: Coping: Goal: Level of anxiety will decrease Outcome: Progressing

## 2024-01-29 NOTE — Progress Notes (Addendum)
 [facility] SURGICAL PROGRESS NOTE (cpt [phone])  Hospital Day(s): 3.   Interval History: Patient seen and examined. No acute events over night. Son does report last night she called him and was talking about being taken to a house. Mentation seems improved this AM. She reports her abdominal pain is markedly improved. No fever, chills, nausea, emesis. Her leukocytosis is stable this AM; WBC 12.4K. Hgb to 9.5; stable - suspect changes represent dilution. Renal function normalized; sCr - 0.90; UO - 1125 ccs. She has been NPO. She continues on Zosyn .   Review of Systems:  Constitutional: denies fever, chills  HEENT: denies cough or congestion  Respiratory: denies any shortness of breath  Cardiovascular: denies chest pain or palpitations  Gastrointestinal: + abdominal pain; denied N/V Genitourinary: denies burning with urination or urinary frequency Musculoskeletal: denies pain, decreased motor or sensation  Vital signs in last 24 hours: [min-max] current  Temp:  [98.2 F (36.8 C)-99.2 F (37.3 C)] 99.2 F (37.3 C) (11/21 0833) Pulse Rate:  [75-92] 87 (11/21 0833) Resp:  [16-18] 18 (11/21 0833) BP: (102-117)/(60-85) 117/85 (11/21 0833) SpO2:  [94 %-100 %] 97 % (11/21 0833)     Height: 5' 3 (160 cm) Weight: 92.9 kg BMI (Calculated): 36.29   Intake/Output last 2 shifts:  11/20 0701 - 11/21 0700 In: 0  Out: 1125 [Urine:1125]   Physical Exam:  Constitutional: She is alert, more oriented and appropriate this AM, NAD HENT: normocephalic without obvious abnormality  Eyes: PERRL, EOM's grossly intact and symmetric  Respiratory: breathing non-labored at rest Cardiovascular: regular rate and sinus rhythm  Genitourinary: Foley in place; good UO  Gastrointestinal: soft, her abdominal pain is improved significantly this AM, still sore to right abdomen but markedly improved from previous exams, no rebound/guarding. She is not overtly peritonitic   Labs:     Latest Ref Rng &  Units 01/29/2024    4:40 AM 01/28/2024    5:54 AM 01/27/2024    6:24 AM  CBC  WBC 4.0 - 10.5 K/uL 12.4  12.4  11.5   Hemoglobin 12.0 - 15.0 g/dL 9.5  9.0  89.2   Hematocrit 36.0 - 46.0 % 27.9  26.6  32.5   Platelets 150 - 400 K/uL 262  224  266       Latest Ref Rng & Units 01/29/2024    4:40 AM 01/28/2024    5:54 AM 01/27/2024    6:24 AM  CMP  Glucose 70 - 99 mg/dL 894  887  856   BUN 6 - 20 mg/dL 12  22  30    Creatinine 0.44 - 1.00 mg/dL 9.09  8.95  8.46   Sodium 135 - 145 mmol/L 134  133  131   Potassium 3.5 - 5.1 mmol/L 4.0  3.9  4.3   Chloride 98 - 111 mmol/L 105  104  101   CO2 22 - 32 mmol/L 17  22  21    Calcium  8.9 - 10.3 mg/dL 7.5  7.0  7.2      Imaging studies:  No new imaging studies  Assessment/Plan: 58 y.o. female with diffuse abdominal pain and diarrhea found to have colitis, complicated by pertinent comorbidities including extensive previous abdominal surgery.   - Mentation appears improved this AM compared to previous 24 hours. She did have confusion over night. Question if there had been some combination of polypharmacy vs hospital delirium. Son is concerned about this and reports similar episodes at home before this as well. She is being  followed by neurology and psychiatry, will defer to these services.   - From an abdominal standpoint, she is doing much better with improvement in pain. No longer with fever and WBC stable. CT Abdomen/Pelvis 11/20 reassuring as well. I do not think she warrants any surgical intervention at this time. Would certainly make efforts to avoid this unless significant deterioration given likely need for subtotal colectomy which would be quite morbid and carry significant risk given her previous surgeries. We will monitor  - I think we can restart CLD this AM - Continue IV Abx (Zosyn )             - Monitor abdominal examination - Monitor renal function; normalized; foley in place per primary service  - Monitor leukocytosis; stable  -  Monitor fever curve; no fever last 24 hours  - Further management per primary service; we will follow     All of the above findings and recommendations were discussed with the patient, paitent's son Charolotte - via telephone), and all of patient's and her son's questions were answered to their expressed satisfaction.  -- Arthea Platt, PA-C [facility] 01/29/2024, 9:09 AM M-F: 7am - 4pm

## 2024-01-29 NOTE — Progress Notes (Signed)
   01/29/24 1115  Spiritual Encounters  Type of Visit Initial  Care provided to: Pt and family  Conversation partners present during encounter Nurse  Referral source Patient request ([facility])  Reason for visit Advance directives  OnCall Visit Yes  Spiritual Framework  Presenting Themes Caregiving needs  Community/Connection Family  Needs/Challenges/Barriers Patient has significant health concerns.  Patient Stress Factors Health changes  Family Stress Factors Other (Comment) (Concern about patient's healthcare.)  Goals  Self/Personal Goals Patient wanted to have an AD in place.  Additional Comment(s) Patient wants son to be involved with her heathcare.  Interventions  Spiritual Care Interventions Made Compassionate presence;Reflective listening;Decision-making support/facilitation  Intervention Outcomes  Outcomes Awareness of health;Awareness of support;Autonomy/agency;Patient family open to resources  Spiritual Care Plan  Spiritual Care Issues Still Outstanding No further spiritual care needs at this time (see row info)   Chaplain visited patient to provide information about an AD per an [facility] [facility] in the system.  Patient and family understood all Chaplain shared about the AD and the notary process.  Family was concerned about wanting to hear decisions about the patient but staff shared that clinicians speak to the patient unless there is Guardianship or HCPOA.  Family and patient understood when Chaplain shared.  Chaplain asked the patient and family to let the Nurse know when they're ready for the notary process.    Rev. Rana M. Nicholaus, M.Div. Chaplain Resident [facility]

## 2024-01-29 NOTE — Consult Note (Signed)
 Consultation  Referring Provider:  Hospitalist    Admit date: 01/25/2024 Consult date: 01/29/2024        Reason for Consultation: Colitis              HPI:   Diana Quinn is a 58 y.o. lady with multiple medical problems including gastric bypass who is here with abdominal pain and imaging findings of colitis. Stool infection has been ruled out but fecal cal elevated to 2000. Minimal bowel movements. Unable to get accurate history due to mental status/tangential behavior. No family in the room. Per chart review she has a history of severe constipation and has been on linzess 290 mcg.  Past Medical History:  Diagnosis Date   Abdominal pain, acute, right upper quadrant 12/12/2014   Abnormal weight loss 05/10/2014   Acute metabolic encephalopathy 09/23/2022   Allergic reaction 09/17/2016   AMS (altered mental status) 06/12/2023   Anemia    Ankle fracture 09/10/2022   Appetite loss 01/14/2015   Bilateral ankle fractures 09/25/2022   CAD (coronary artery disease) 08/10/2020   Closed fracture of left distal tibia 09/10/2022   Closed torus fracture of upper end of right fibula 04/13/2019   E. coli UTI 09/18/2022   EKG, abnormal 02/27/2013   Elevated lithium  level 05/10/2014   Gastric bypass status for obesity    H/O unstable angina    History of seizure 09/24/2022   Hx SBO 09/30/2014   Hypoglycemia    Hypokalemia 10/03/2022   Hyponatremia 09/17/2022   MI (myocardial infarction) [facility])    Migraine headache 03/24/2011   Note: Unchanged - sees [facility] neurology, Dr. German  Note: Unchanged - sees [facility] neurolog[facility], Dr. German Deal of this note might be different from the original.  Note: Unchanged - sees [facility] neurology, Dr. German     Occipital headache (Fourth Area of Pain) (Bilateral) (L>R) 03/31/2018   Orthostatic hypotension    Osteoporosis    PEG tube malfunction (HCC) 01/13/2015   Poisoning by unspecified drugs, medicaments and biological substances, accidental  (unintentional), initial encounter 02/27/2013   Polysubstance abuse (HCC) 11/30/2016   SBO (small bowel obstruction) (HCC) 09/23/2014   Sciatic nerve disease    Seizures (HCC)    Skin infection at gastrostomy tube site (HCC) 12/29/2014   Suicidal behavior 02/27/2013   Thyroid  disease    TIA (transient ischemic attack) 04/22/2017    Past Surgical History:  Procedure Laterality Date   ABDOMINAL ADHESION SURGERY     ABDOMINAL HYSTERECTOMY     CHOLECYSTECTOMY     DILATION AND CURETTAGE OF UTERUS     ESOPHAGOGASTRODUODENOSCOPY     GASTRIC BYPASS     GASTRIC BYPASS OPEN     revision   GASTROSTOMY W/ FEEDING TUBE     HERNIA REPAIR     OOPHORECTOMY     SPLENECTOMY     TONSILLECTOMY      Family History  Problem Relation Age of Onset   Cancer Mother    Hypertension Mother    COPD Mother    Asthma Sister    Psoriasis Sister    Epilepsy Brother     Social History   Tobacco Use   Smoking status: Never   Smokeless tobacco: Never  Vaping Use   Vaping status: Never Used  Substance Use Topics   Alcohol use: Not Currently   Drug use: Never    Prior to Admission medications   Medication Sig Start Date End Date Taking? Authorizing Provider  acetaminophen  (TYLENOL )  325 MG tablet Take 2 tablets (650 mg total) by mouth every 6 (six) hours as needed for mild pain or moderate pain. 09/25/22  Yes Wieting, Richard, MD  apixaban  (ELIQUIS ) 2.5 MG TABS tablet Take 1 tablet (2.5 mg total) by mouth 2 (two) times daily. 06/22/23  Yes Orlean Alan HERO, FNP  atorvastatin  (LIPITOR) 40 MG tablet Take 1 tablet (40 mg total) by mouth daily. 01/12/24  Yes Orlean Alan HERO, FNP  BELSOMRA  20 MG TABS Take 1 tablet by mouth at bedtime as needed. 10/09/22  Yes [provider]  butalbital -apap-caffeine -codeine (FIORICET  WITH CODEINE) 50-325-40-30 MG capsule Take 1 capsule by mouth every 6 (six) hours as needed for headache. 01/12/24  Yes Orlean Alan HERO, FNP  CAPLYTA  42 MG capsule Take 1 capsule  (42 mg total) by mouth daily. 05/15/23  Yes Orlean Alan HERO, FNP  cetirizine  (ZYRTEC  ALLERGY) 10 MG tablet Take 1 tablet (10 mg total) by mouth daily. 06/10/23 06/09/24 Yes Orlean Alan HERO, FNP  clonazePAM  (KLONOPIN ) 1 MG tablet Take 1 mg by mouth at bedtime as needed. 09/28/22  Yes [provider]  Diclofenac  Sodium 3 % GEL Apply 1 Application topically in the morning and at bedtime. 01/12/24  Yes Orlean Alan HERO, FNP  gabapentin  (NEURONTIN ) 600 MG tablet Take 1 tablet (600 mg total) by mouth in the morning, at noon, in the evening, and at bedtime. 01/12/24  Yes Orlean Alan HERO, FNP  Galcanezumab -gnlm (EMGALITY ) 120 MG/ML SOAJ Inject 1 mL into the skin every 30 (thirty) days. 01/12/24 02/11/24 Yes Orlean Alan HERO, FNP  GVOKE HYPOPEN  2-PACK 1 MG/0.2ML SOAJ Inject 1 mg into the skin as needed (hypoglycemia). 05/15/23  Yes Orlean Alan HERO, FNP  insulin  aspart (NOVOLOG  FLEXPEN) 100 UNIT/ML FlexPen Check blood sugars with meals and administer as follows: BG < 200 - no insulin ; BG 200-249 = 2 units; BG 250-299 = 4 units; BG 300-349 = 6 units; BG 350-399 = 8 units; BG 400+ = 10 units and recheck in 15 minutes. Maximum of 30 units per day. 10/15/23  Yes Orlean Alan HERO, FNP  levothyroxine  (SYNTHROID ) 112 MCG tablet TAKE 1 TABLET BY MOUTH ONCE DAILY BEFORE BREAKFAST 12/09/23  Yes Orlean Alan HERO, FNP  Multiple Vitamin (MULTIVITAMIN WITH MINERALS) TABS tablet Take 1 tablet by mouth daily. 09/26/22  Yes Wieting, Richard, MD  Oxcarbazepine  (TRILEPTAL ) 300 MG tablet Take 2 tablets (600 mg total) by mouth 2 (two) times daily. 01/20/24  Yes Orlean Alan HERO, FNP  pantoprazole  (PROTONIX ) 40 MG tablet Take 1 tablet (40 mg total) by mouth daily. 01/12/24  Yes Orlean Alan HERO, FNP  PARoxetine  (PAXIL ) 40 MG tablet Take 1 tablet (40 mg total) by mouth daily. 05/19/23  Yes Fernand Fredy RAMAN, MD  prazosin  (MINIPRESS ) 2 MG capsule Take 2 mg by mouth 2 (two) times daily. 01/23/24  Yes [provider]   promethazine (PHENERGAN) 12.5 MG tablet TAKE 1 TABLET BY MOUTH EVERY 6 HOURS AS NEEDED NAUSEA *NEW PRESCRIPTION REQUEST* 12/15/23  Yes Orlean Alan HERO, FNP  propranolol  (INDERAL ) 20 MG tablet Take 20 mg by mouth 2 (two) times daily as needed. 01/23/24  Yes [provider]  QUEtiapine  (SEROQUEL ) 100 MG tablet Take 3 tablets (300 mg total) by mouth at bedtime. 07/06/23  Yes Orlean Alan HERO, FNP  ranolazine  (RANEXA ) 1000 MG SR tablet TAKE ONE (1) TABLET BY MOUTH TWICE DAILY *NEW PRESCRIPTION REQUEST* 12/15/23  Yes Orlean Alan HERO, FNP  tiZANidine  (ZANAFLEX ) 4 MG tablet Take 2 tablets (8 mg  total) by mouth 3 (three) times daily. 01/12/24  Yes Orlean Alan HERO, FNP  Topiramate  ER (TROKENDI  XR) 200 MG CP24 Take 1 capsule (200 mg total) by mouth daily. 11/17/23  Yes Orlean Alan HERO, FNP  traZODone  (DESYREL ) 150 MG tablet Take 2 tablets (300 mg total) by mouth at bedtime. 07/06/23  Yes Orlean Alan HERO, FNP  TROKENDI  XR 200 MG CP24 Take 1 capsule (200 mg total) by mouth daily. 01/12/24  Yes Orlean Alan HERO, FNP  Ubrogepant  (UBRELVY ) 100 MG TABS Take 1 tablet (100 mg total) by mouth as needed. 01/12/24  Yes Orlean Alan HERO, FNP  Vitamin D , Ergocalciferol , (DRISDOL ) 1.25 MG (50000 UNIT) CAPS capsule TAKE 1 CAPSULE BY MOUTH EVERY WEEK *NEW PRESCRIPTION REQUEST* 12/15/23  Yes Orlean Alan HERO, FNP  vitamin D3 (CHOLECALCIFEROL ) 25 MCG tablet Take 1 tablet (1,000 Units total) by mouth daily. 06/22/23  Yes Orlean Alan HERO, FNP  ACCU-CHEK GUIDE TEST test strip TEST BLOOD SUGAR UP TO THREE TIMES DAILY BEFORE MEALS *NEW PRESCRIPTION REQUEST* 12/15/23   Orlean Alan HERO, FNP  Alcohol Swabs (ALCOHOL PREP) 70 % PADS USE WITH INSULIN  THREE TIMES A DAY AS NEEDED *NEW PRESCRIPTION REQUEST* 12/15/23   Orlean Alan HERO, FNP  Continuous Glucose Sensor (FREESTYLE LIBRE 3 SENSOR) MISC Place 1 sensor on the skin every 14 days. Use to check glucose continuously Patient not taking: No sig reported 10/28/22   Orlean Alan HERO, FNP  cyanocobalamin  (VITAMIN B12) 1000 MCG/ML injection Inject 1 mL (1,000 mcg total) into the muscle every 30 (thirty) days. 10/30/23   Scoggins, Hospital Doctor, NP  EMBECTA PEN NEEDLE NANO 2 GEN 32G X 4 MM MISC USE WITH INSULIN  THREE TIMES A DAY AS NEEDED *NEW PRESCRIPTION REQUEST* 12/15/23   Orlean Alan HERO, FNP  famotidine  (PEPCID ) 20 MG tablet Take 1 tablet (20 mg total) by mouth 2 (two) times daily. Patient not taking: Reported on 01/26/2024 06/10/23 06/09/24  Orlean Alan HERO, FNP  metFORMIN (GLUCOPHAGE) 500 MG tablet Take 500 mg by mouth 2 (two) times daily. Patient not taking: No sig reported 06/09/23   [provider]  ranolazine  (RANEXA ) 500 MG 12 hr tablet TAKE ONE TABLET BY MOUTH TWICE A DAY Patient not taking: Reported on 01/26/2024 07/20/23   Orlean Alan HERO, FNP  sucralfate  (CARAFATE ) 1 g tablet Take 1 tablet (1 g total) by mouth 4 (four) times daily. Patient not taking: Reported on 01/26/2024 06/10/23   Orlean Alan HERO, FNP    Current Facility-Administered Medications  Medication Dose Route Frequency Provider Last Rate Last Admin   acetaminophen  (TYLENOL ) tablet 650 mg  650 mg Oral Q6H PRN Niu, Xilin, MD   650 mg at 01/27/24 1803   atorvastatin  (LIPITOR) tablet 40 mg  40 mg Oral Daily Niu, Xilin, MD   40 mg at 01/29/24 0935   butalbital -acetaminophen -caffeine  (FIORICET ) 50-325-40 MG per tablet 1 tablet  1 tablet Oral Q6H PRN Niu, Xilin, MD       Chlorhexidine  Gluconate Cloth 2 % PADS 6 each  6 each Topical Daily Von Bellis, MD   6 each at 01/29/24 9063   clonazePAM  (KLONOPIN ) tablet 1 mg  1 mg Oral QHS PRN Niu, Xilin, MD       cyanocobalamin  (VITAMIN B12) injection 1,000 mcg  1,000 mcg Intramuscular Daily Von Bellis, MD   1,000 mcg at 01/29/24 1054   gabapentin  (NEURONTIN ) capsule 600 mg  600 mg Oral BID Von Bellis, MD   600 mg at 01/29/24 0935   hydrALAZINE  (APRESOLINE )  injection 10 mg  10 mg Intravenous Q2H PRN Niu, Xilin, MD       insulin  aspart (novoLOG )  injection 0-5 Units  0-5 Units Subcutaneous QHS Niu, Xilin, MD       insulin  aspart (novoLOG ) injection 0-9 Units  0-9 Units Subcutaneous TID WC Niu, Xilin, MD   1 Units at 01/27/24 1804   levothyroxine  (SYNTHROID ) tablet 112 mcg  112 mcg Oral Q0600 Niu, Xilin, MD   112 mcg at 01/29/24 0544   LORazepam  (ATIVAN ) injection 2 mg  2 mg Intravenous Q2H PRN Niu, Xilin, MD   2 mg at 01/28/24 1345   lumateperone  tosylate (CAPLYTA ) capsule 42 mg  42 mg Oral Daily Niu, Xilin, MD   42 mg at 01/29/24 1053   multivitamin with minerals tablet 1 tablet  1 tablet Oral Daily Niu, Xilin, MD   1 tablet at 01/29/24 0935   ondansetron  (ZOFRAN ) injection 4 mg  4 mg Intravenous Q8H PRN Niu, Xilin, MD       Oxcarbazepine  (TRILEPTAL ) tablet 600 mg  600 mg Oral BID Niu, Xilin, MD   600 mg at 01/29/24 1053   oxyCODONE  (Oxy IR/ROXICODONE ) immediate release tablet 5 mg  5 mg Oral Q6H PRN Niu, Xilin, MD   5 mg at 01/27/24 2044   pantoprazole  (PROTONIX ) EC tablet 40 mg  40 mg Oral Daily Niu, Xilin, MD   40 mg at 01/29/24 0935   PARoxetine  (PAXIL ) tablet 40 mg  40 mg Oral Daily Von Bellis, MD   40 mg at 01/29/24 1053   phosphorus (K PHOS  NEUTRAL) tablet 500 mg  500 mg Oral TID Von Bellis, MD   500 mg at 01/29/24 1054   piperacillin -tazobactam (ZOSYN ) IVPB 3.375 g  3.375 g Intravenous Q8H Niu, Xilin, MD 12.5 mL/hr at 01/29/24 0805 3.375 g at 01/29/24 0805   ranolazine  (RANEXA ) 12 hr tablet 1,000 mg  1,000 mg Oral BID Niu, Xilin, MD   1,000 mg at 01/29/24 1053   saccharomyces boulardii (FLORASTOR) capsule 250 mg  250 mg Oral BID Von Bellis, MD   250 mg at 01/29/24 9062   SUMAtriptan  (IMITREX ) tablet 50 mg  50 mg Oral Q2H PRN Niu, Xilin, MD       topiramate  (TOPAMAX ) tablet 100 mg  100 mg Oral BID Niu, Xilin, MD   100 mg at 01/29/24 9063   Vitamin D  (Ergocalciferol ) (DRISDOL ) 1.25 MG (50000 UNIT) capsule 50,000 Units  50,000 Units Oral Q7 days Von Bellis, MD   50,000 Units at 01/27/24 1235   zolpidem  (AMBIEN ) tablet 5 mg   5 mg Oral QHS PRN Niu, Xilin, MD        Allergies as of 01/25/2024 - Review Complete 01/25/2024  Allergen Reaction Noted   Depakote [divalproex sodium] Shortness Of Breath 01/15/2018   Pregabalin Other (See Comments), Rash, and Shortness Of Breath 04/25/2012   Valproic acid  Other (See Comments) and Shortness Of Breath 04/25/2012   Levetiracetam Itching and Rash 09/19/2016     Review of Systems:    All systems reviewed and negative except where noted in HPI.  Review of Systems  Unable to perform ROS: Mental acuity       Physical Exam:  Vital signs in last 24 hours: Temp:  [98.2 F (36.8 C)-99.2 F (37.3 C)] 99.2 F (37.3 C) (11/21 0833) Pulse Rate:  [75-92] 87 (11/21 0833) Resp:  [16-18] 18 (11/21 0833) BP: (102-117)/(60-85) 117/85 (11/21 0833) SpO2:  [96 %-100 %] 97 % (11/21 0833) Last BM Date :  01/25/24 General:   No acute distress Head:  Normocephalic and atraumatic. Eyes:   No icterus.   Conjunctiva pink. Ears:  Normal auditory acuity. Mouth: Mucosa pink moist, no lesions. Neck:  Supple; no masses felt Lungs:  No respiratory distress.  Heart:  S1S2, RRR, no MRG. No edema. Abdomen:   Flat, soft, nondistended, nontender Rectal:  Not performed.  Msk:  Symmetrical without gross deformities. Neurologic:  Somewhat altered/tangential Skin:  Warm, dry, pink without significant lesions or rashes.  LAB RESULTS: Recent Labs    01/27/24 0624 01/28/24 0554 01/29/24 0440  WBC 11.5* 12.4* 12.4*  HGB 10.7* 9.0* 9.5*  HCT 32.5* 26.6* 27.9*  PLT 266 224 262   BMET Recent Labs    01/27/24 0624 01/28/24 0554 01/29/24 0440  NA 131* 133* 134*  K 4.3 3.9 4.0  CL 101 104 105  CO2 21* 22 17*  GLUCOSE 143* 112* 105*  BUN 30* 22* 12  CREATININE 1.53* 1.04* 0.90  CALCIUM  7.2* 7.0* 7.5*   LFT Recent Labs    01/27/24 0624  ALBUMIN 2.7*   PT/INR No results for input(s): LABPROT, INR in the last 72 hours.  STUDIES: EEG adult Result Date: 01/28/2024 Gregg Lek, MD     01/28/2024  6:42 PM Patient Name: Diana Quinn MRN: [id number] Epilepsy Attending: Lek Gregg Referring Physician/Provider: No ref. provider found     Date: 01/28/2024 Duration: Patient history: 58 year old woman with altered mental status, with generalized body twitching. Level of alertness: lethargic AEDs during EEG study: Clonazepam , Gabapentin , Topiramate , Oxcarbazepine  Technical aspects: This EEG study was done with scalp electrodes positioned according to the 10-20 International system of electrode placement. Electrical activity was reviewed with band pass filter of 1-70Hz , sensitivity of 7 uV/mm, display speed of 21mm/sec with a 60Hz  notched filter applied as appropriate. EEG data were recorded continuously and digitally stored.  Video monitoring was available and reviewed as appropriate. Description: EEG showed continuous generalized polymorphic sharply contoured 3 to 6 Hz theta-delta slowing. Hyperventilation and photic stimulation were not performed.   ABNORMALITY - Continuous slow, generalized IMPRESSION: This study is suggestive of moderate diffuse encephalopathy, nonspecific etiology but likely related to sedation, toxic-metabolic etiology. No seizures or epileptiform discharges were seen throughout the recording. Lek Gregg MD Neurology    CT ABDOMEN PELVIS W CONTRAST Result Date: 01/28/2024 EXAM: CT ABDOMEN AND PELVIS WITH CONTRAST 01/28/2024 01:30:35 PM TECHNIQUE: CT of the abdomen and pelvis was performed with the administration of intravenous contrast. 100 mL of iohexol  (OMNIPAQUE ) 300 MG/ML solution was administered. Multiplanar reformatted images are provided for review. Automated exposure control, iterative reconstruction, and/or weight-based adjustment of the mA/kV was utilized to reduce the radiation dose to as low as reasonably achievable. COMPARISON: 3 days ago. CLINICAL HISTORY: Abdominal pain, acute, nonlocalized; Worsening abdominal pain, fever, leukocytosis  admitted with colitis, CT to reassess progression. FINDINGS: LOWER CHEST: Minimal bibasilar subsegmental atelectasis. LIVER: The liver is unremarkable. GALLBLADDER AND BILE DUCTS: Status post cholecystectomy. No biliary ductal dilatation. SPLEEN: Status post splenectomy. PANCREAS: No acute abnormality. ADRENAL GLANDS: No acute abnormality. KIDNEYS, URETERS AND BLADDER: No stones in the kidneys or ureters. No hydronephrosis. No perinephric or periureteral stranding. Urinary bladder is decompressed secondary to foley catheter. GI AND BOWEL: Status post gastric bypass. Right, transverse and descending colon consistent with infectious or inflammatory colitis. Stable appendiceal thickening is noted compared to prior exam which may be reactive or secondary to adjacent inflammation. There is no bowel obstruction. PERITONEUM AND RETROPERITONEUM: No ascites. No  free air. VASCULATURE: Aorta is normal in caliber. LYMPH NODES: No lymphadenopathy. REPRODUCTIVE ORGANS: Status post hysterectomy. BONES AND SOFT TISSUES: Old L1 fracture is noted. No focal soft tissue abnormality. IMPRESSION: 1. Right, transverse, and descending colon findings consistent with infectious or inflammatory colitis. 2. Stable appendiceal thickening compared to prior exam, possibly reactive or secondary to adjacent inflammation. Electronically signed by: Lynwood Seip MD 01/28/2024 01:55 PM EST RP Workstation: [id number]   CT HEAD WO CONTRAST ( ) Result Date: 01/28/2024 EXAM: CT HEAD WITHOUT CONTRAST 01/28/2024 01:30:35 PM TECHNIQUE: CT of the head was performed without the administration of intravenous contrast. Automated exposure control, iterative reconstruction, and/or weight based adjustment of the mA/kV was utilized to reduce the radiation dose to as low as reasonably achievable. COMPARISON: Head CT 06/12/2023 and MRI 06/07/2019. CLINICAL HISTORY: Mental status change, unknown cause. FINDINGS: BRAIN AND VENTRICLES: There is no evidence of an  acute infarct, intracranial hemorrhage, mass, midline shift, hydrocephalus, or extra-axial fluid collection. Cerebral volume is within normal limits for age. ORBITS: No acute abnormality. SINUSES: Minimal mucosal thickening in the included portions of the paranasal sinuses. Clear mastoid air cells. SOFT TISSUES AND SKULL: No acute soft tissue abnormality. No skull fracture. IMPRESSION: 1. No acute intracranial abnormality. Electronically signed by: Dasie Hamburg MD 01/28/2024 01:41 PM EST RP Workstation: [id number]       Impression / Plan:    58 y.o. lady with multiple medical problems including gastric bypass who is here with abdominal pain and imaging findings of colitis. No systemic illness. I suspect ischemic colitis but she would benefit from endoscopic evaluation. Will attempt to discuss with patient/family tomorrow.  - continue clear liquids - monitor for any abdominal pain, hematochezia. - will check CRP - will continue to follow  Please call with any questions or concerns.  Frederic Schick MD, MPH [facility] GI

## 2024-01-29 NOTE — Progress Notes (Signed)
 Pt found with foley catheter lying in bed upon transfer to bathroom.  Jerre Sor, MD, aware who gave orders to leave out foley and begin bladder scans q4h.

## 2024-01-30 DIAGNOSIS — F259 Schizoaffective disorder, unspecified: Secondary | ICD-10-CM | POA: Diagnosis not present

## 2024-01-30 DIAGNOSIS — F319 Bipolar disorder, unspecified: Secondary | ICD-10-CM

## 2024-01-30 DIAGNOSIS — F418 Other specified anxiety disorders: Secondary | ICD-10-CM | POA: Diagnosis not present

## 2024-01-30 DIAGNOSIS — K529 Noninfective gastroenteritis and colitis, unspecified: Secondary | ICD-10-CM | POA: Diagnosis not present

## 2024-01-30 LAB — C-REACTIVE PROTEIN: CRP: 21 mg/dL — ABNORMAL HIGH (ref <1.0)

## 2024-01-30 LAB — BASIC METABOLIC PANEL WITH GFR
Anion gap: 14 (ref 5–15)
BUN: 11 mg/dL (ref 6–20)
CO2: 23 mmol/L (ref 22–32)
Calcium: 7.8 mg/dL — ABNORMAL LOW (ref 8.9–10.3)
Chloride: 101 mmol/L (ref 98–111)
Creatinine, Ser: 0.93 mg/dL (ref 0.44–1.00)
GFR, Estimated: >60 mL/min (ref >60)
Glucose, Bld: 111 mg/dL — ABNORMAL HIGH (ref 70–99)
Potassium: 3.6 mmol/L (ref 3.5–5.1)
Sodium: 138 mmol/L (ref 135–145)

## 2024-01-30 LAB — GLUCOSE, CAPILLARY
Glucose-Capillary: 84 mg/dL (ref 70–99)
Glucose-Capillary: 122 mg/dL — ABNORMAL HIGH (ref 70–99)
Glucose-Capillary: 129 mg/dL — ABNORMAL HIGH (ref 70–99)
Glucose-Capillary: 138 mg/dL — ABNORMAL HIGH (ref 70–99)
Glucose-Capillary: 125 mg/dL — ABNORMAL HIGH (ref 70–99)

## 2024-01-30 LAB — CBC
HCT: 29 % — ABNORMAL LOW (ref 36.0–46.0)
Hemoglobin: 9.9 g/dL — ABNORMAL LOW (ref 12.0–15.0)
MCH: 30.8 pg (ref 26.0–34.0)
MCHC: 34.1 g/dL (ref 30.0–36.0)
MCV: 90.3 fL (ref 80.0–100.0)
Platelets: 307 K/uL (ref 150–400)
RBC: 3.21 MIL/uL — ABNORMAL LOW (ref 3.87–5.11)
RDW: 14.9 % (ref 11.5–15.5)
WBC: 15.5 K/uL — ABNORMAL HIGH (ref 4.0–10.5)
nRBC: 0.2 % (ref 0.0–0.2)

## 2024-01-30 LAB — PHOSPHORUS: Phosphorus: 4.2 mg/dL (ref 2.5–4.6)

## 2024-01-30 LAB — MAGNESIUM: Magnesium: 2.3 mg/dL (ref 1.7–2.4)

## 2024-01-30 MED ORDER — PEG 3350-KCL-NA BICARB-NACL 420 G PO SOLR
4000.0000 mL | Freq: Once | ORAL | Status: AC
  Administered 2024-01-30: 4000 mL via ORAL
  Filled 2024-01-30: qty 4000

## 2024-01-30 NOTE — Progress Notes (Signed)
 Dr Cleatus notified of pt'd bladder scan of [phone].

## 2024-01-30 NOTE — Consult Note (Signed)
 [facility] Psychiatric Consult Follow-up  Patient Name: .Diana Quinn  MRN: [id number]  DOB: 1965-08-29  Consult Order details:  Orders (From admission, onward)     Start     Ordered   01/28/24 0812  IP CONSULT TO PSYCHIATRY       Ordering Provider: Von Bellis, MD  Provider:  Donnelly Mellow, MD  Question Answer Comment  Location [facility]   Reason for Consult? History of bipolar and schizophrenia, polypharmacy.  Patient is drowsy and twitching whole body.  Please eval thanks      01/28/24 9187             Mode of Visit: In person    Psychiatry Consult Evaluation  Service Date: January 30, 2024 LOS:  LOS: 4 days  Chief Complaint Polypharmacy  Primary Psychiatric Diagnoses  Schizoaffective Disorder   Assessment  Initial consult note: Patient is a 58 year old female with chronic psychiatric and medical conditions evaluated for polypharmacy and possible extrapyramidal symptoms. Mental status during exam is somnolent with waxing and waning alertness, likely influenced by her current medical presentation and sedating medication load. Primary team has discontinued Seroquel  and trazodone  due to concern for oversedation. She reports involuntary movements at baseline unclear if this is an exacerbation, notes home benzodiazepines have no effect on involuntary movements. . She denies SI, HI, and AVH, and no behavioral concerns or signs of acute psychiatric decompensation are observed. There are no acute psychiatric concerns at this time. Neuro is on board and EEG is pending. Attempted to further assess later in the day and patient had received ativan  and was sedated. We will continue to work to assess. Given no acute psychiatric condition will defer med management and further work up to primary team. Would also recommend discontinue Ambien . Defer to Neurology recommendation to D/c klonopin  in favor of ativan . Delirium precautions.    01/30/2024: Patient  is reassessed on the inpatient unit today. She continues to be slightly sedated however able to engage in a meaningful interview. She is tangential in nature and has been tolerating Caplyta  without difficulty. It is not able to be determined at this time its effectiveness in symptoms management. There is no evidence of psychosis at this time and she does not appear to be responding to internal stimuli. Feedback from nursing is that she is calm and cooperative. Will continue to monitor effectiveness of Captyla.     Diagnoses:  Active Hospital problems: Principal Problem:   Acute colitis Active Problems:   Schizophrenia (HCC)   Essential hypertension   Seizure (HCC)   HLD (hyperlipidemia)   CAD (coronary artery disease)   Hypothyroidism   Depression with anxiety   Bipolar disorder (HCC)   Atrial fibrillation, chronic (HCC)   Hyperkalemia   AKI (acute kidney injury)   Type II diabetes mellitus (HCC)    Plan   ## Psychiatric Medication Recommendations:  Continue caplyta   ## Medical Decision Making Capacity: Not specifically addressed in this encounter  ## Further Work-up:   None at this time  ## Disposition:-- There are no psychiatric contraindications to discharge at this time  ## Behavioral / Environmental: -Delirium Precautions: Delirium Interventions for Nursing and Staff: - RN to open blinds every AM. - To Bedside: Glasses, hearing aide, and pt's own shoes. Make available to patients. when possible and encourage use. - Encourage po fluids when appropriate, keep fluids within reach. - OOB to chair with meals. - Passive ROM exercises to all extremities with AM & PM care. -  RN to assess orientation to person, time and place QAM and PRN. - Recommend extended visitation hours with familiar family/friends as feasible. - Staff to minimize disturbances at night. Turn off television when pt asleep or when not in use.    ## Safety and Observation Level:  - Based on my clinical  evaluation, I estimate the patient to be at LOW risk of self harm in the current setting. - At this time, we recommend  routine. This decision is based on my review of the chart including patient's history and current presentation, interview of the patient, mental status examination, and consideration of suicide risk including evaluating suicidal ideation, plan, intent, suicidal or self-harm behaviors, risk factors, and protective factors. This judgment is based on our ability to directly address suicide risk, implement suicide prevention strategies, and develop a safety plan while the patient is in the clinical setting. Please contact our team if there is a concern that risk level has changed.  CSSR Risk Category:C-SSRS RISK CATEGORY: No Risk  Suicide Risk Assessment: Patient has following modifiable risk factors for suicide: medication noncompliance, which we are addressing by medication adherence. Patient has following non-modifiable or demographic risk factors for suicide: psychiatric hospitalization Patient has the following protective factors against suicide: no history of suicide attempts  Thank you for this consult request. Recommendations have been communicated to the primary team.  We will continue to monitor patient progress and medication response at this time.   Janisa Labus B Maleek Craver, NP       History of Present Illness  Relevant Aspects of [facility]   Patient Report:  01/30/2024: Patient is reassessed on the inpatient unit today. She continues to be slightly sedated however able to engage in a meaningful interview. She is tangential in nature and has been tolerating Caplyta  without difficulty. It is not able to be determined at this time its effectiveness in symptoms management. There is no evidence of psychosis at this time and she does not appear to be responding to internal stimuli. Feedback from nursing is that she is calm and cooperative. Will continue to monitor  effectiveness of Captyla.   Psych ROS:  Depression: endorses Anxiety:  denies Mania (lifetime and current): denies Psychosis: (lifetime and current): hx  Collateral information:  none    Psychiatric and Social History  Psychiatric History:  Information collected from patient and chart  Prev Dx/Sx: schizoaffective Current Psych Provider: yes Home Meds (current): see chart Previous Med Trials: unknown Therapy: yes  Prior Psych Hospitalization: yes  Prior Self Harm: denies Prior Violence: denies  Family Psych History: denies Family Hx suicide: denies  Social History:   Educational Hx: some college Occupational Hx: unknown [employer] Officer Hx: denies Living Situation: lives alone Spiritual Hx: christian Access to weapons/lethal means: denies   Substance History Alcohol: hx of alcohol use disorder  Type of alcohol denies Last Drink denies Number of drinks per day denies History of alcohol withdrawal seizures denies History of DT's denies Tobacco: denies Illicit drugs: denies Prescription drug abuse: denies Rehab hx: yes  Exam Findings  Physical Exam: Reviewed and agree with the physical exam findings conducted by the medical provider Vital Signs:  Temp:  [97.8 F (36.6 C)-99.4 F (37.4 C)] 97.8 F (36.6 C) (11/22 0814) Pulse Rate:  [72-86] 77 (11/22 0814) Resp:  [14-20] 14 (11/22 0814) BP: (107-134)/(53-107) 107/66 (11/22 0814) SpO2:  [96 %-98 %] 98 % (11/22 0814) Blood pressure 107/66, pulse 77, temperature 97.8 F (36.6 C), resp. rate 14, height 5' 3 (  1.6 m), weight 92.9 kg, SpO2 98%. Body mass index is 36.28 kg/m.    Mental Status Exam: General Appearance: Fairly Groomed  Orientation:  Full (Time, Place, and Person)  Memory:  Immediate;   Fair Recent;   Fair Remote;   Fair  Concentration:  Concentration: Fair and Attention Span: Fair  Recall:  Fair  Attention  Fair  Eye Contact:  Fair  Speech:  Clear and Coherent  Language:  Good  Volume:  Normal   Mood: euthymic  Affect:  Congruent  Thought Process:  Coherent  Thought Content:  Tangential  Suicidal Thoughts:  No  Homicidal Thoughts:  No  Judgement:  Fair  Insight:  Fair  Psychomotor Activity:  Normal  Akathisia:  No  Fund of Knowledge:  Good      Assets:  Housing  Cognition:  Impaired,  Mild  ADL's:  Intact  AIMS (if indicated):        Other History   These have been pulled in through the EMR, reviewed, and updated if appropriate.  Family History:  The patient's family history includes Asthma in her sister; COPD in her mother; Cancer in her mother; Epilepsy in her brother; Hypertension in her mother; Psoriasis in her sister.  Medical History: Past Medical History:  Diagnosis Date   Abdominal pain, acute, right upper quadrant 12/12/2014   Abnormal weight loss 05/10/2014   Acute metabolic encephalopathy 09/23/2022   Allergic reaction 09/17/2016   AMS (altered mental status) 06/12/2023   Anemia    Ankle fracture 09/10/2022   Appetite loss 01/14/2015   Bilateral ankle fractures 09/25/2022   CAD (coronary artery disease) 08/10/2020   Closed fracture of left distal tibia 09/10/2022   Closed torus fracture of upper end of right fibula 04/13/2019   E. coli UTI 09/18/2022   EKG, abnormal 02/27/2013   Elevated lithium  level 05/10/2014   Gastric bypass status for obesity    H/O unstable angina    History of seizure 09/24/2022   Hx SBO 09/30/2014   Hypoglycemia    Hypokalemia 10/03/2022   Hyponatremia 09/17/2022   MI (myocardial infarction) [facility])    Migraine headache 03/24/2011   Note: Unchanged - sees [facility] neurology, Dr. German  Note: Unchanged - sees [facility] neurology, Dr. German Deal of this note might be different from the original.  Note: Unchanged - sees [facility] neurology, Dr. German     Occipital headache (Fourth Area of Pain) (Bilateral) (L>R) 03/31/2018   Orthostatic hypotension    Osteoporosis    PEG tube malfunction (HCC) 01/13/2015   Poisoning by  unspecified drugs, medicaments and biological substances, accidental (unintentional), initial encounter 02/27/2013   Polysubstance abuse (HCC) 11/30/2016   SBO (small bowel obstruction) (HCC) 09/23/2014   Sciatic nerve disease    Seizures (HCC)    Skin infection at gastrostomy tube site (HCC) 12/29/2014   Suicidal behavior 02/27/2013   Thyroid  disease    TIA (transient ischemic attack) 04/22/2017    Surgical History: Past Surgical History:  Procedure Laterality Date   ABDOMINAL ADHESION SURGERY     ABDOMINAL HYSTERECTOMY     CHOLECYSTECTOMY     DILATION AND CURETTAGE OF UTERUS     ESOPHAGOGASTRODUODENOSCOPY     GASTRIC BYPASS     GASTRIC BYPASS OPEN     revision   GASTROSTOMY W/ FEEDING TUBE     HERNIA REPAIR     OOPHORECTOMY     SPLENECTOMY     TONSILLECTOMY  Medications:   Current Facility-Administered Medications:    acetaminophen  (TYLENOL ) tablet 650 mg, 650 mg, Oral, Q6H PRN, Niu, Xilin, MD, 650 mg at 01/27/24 1803   atorvastatin  (LIPITOR) tablet 40 mg, 40 mg, Oral, Daily, Niu, Xilin, MD, 40 mg at 01/30/24 9147   butalbital -acetaminophen -caffeine  (FIORICET ) 50-325-40 MG per tablet 1 tablet, 1 tablet, Oral, Q6H PRN, Niu, Xilin, MD   Chlorhexidine  Gluconate Cloth 2 % PADS 6 each, 6 each, Topical, Daily, Von Bellis, MD, 6 each at 01/29/24 9063   clonazePAM  (KLONOPIN ) tablet 1 mg, 1 mg, Oral, QHS PRN, Niu, Xilin, MD, 1 mg at 01/29/24 2258   cyanocobalamin  (VITAMIN B12) injection 1,000 mcg, 1,000 mcg, Intramuscular, Daily, Von Bellis, MD, 1,000 mcg at 01/30/24 9146   gabapentin  (NEURONTIN ) capsule 600 mg, 600 mg, Oral, BID, Von Bellis, MD, 600 mg at 01/30/24 9147   hydrALAZINE  (APRESOLINE ) injection 10 mg, 10 mg, Intravenous, Q2H PRN, Niu, Xilin, MD   insulin  aspart (novoLOG ) injection 0-5 Units, 0-5 Units, Subcutaneous, QHS, Niu, Xilin, MD   insulin  aspart (novoLOG ) injection 0-9 Units, 0-9 Units, Subcutaneous, TID WC, Niu, Xilin, MD, 1 Units at 01/27/24  1804   levothyroxine  (SYNTHROID ) tablet 112 mcg, 112 mcg, Oral, Q0600, Niu, Xilin, MD, 112 mcg at 01/30/24 0516   LORazepam  (ATIVAN ) injection 2 mg, 2 mg, Intravenous, Q2H PRN, Niu, Xilin, MD, 2 mg at 01/28/24 1345   lumateperone  tosylate (CAPLYTA ) capsule 42 mg, 42 mg, Oral, Daily, Niu, Xilin, MD, 42 mg at 01/30/24 9147   multivitamin with minerals tablet 1 tablet, 1 tablet, Oral, Daily, Niu, Xilin, MD, 1 tablet at 01/30/24 9147   ondansetron  (ZOFRAN ) injection 4 mg, 4 mg, Intravenous, Q8H PRN, Niu, Xilin, MD   Oxcarbazepine  (TRILEPTAL ) tablet 600 mg, 600 mg, Oral, BID, Niu, Xilin, MD, 600 mg at 01/30/24 9147   oxyCODONE  (Oxy IR/ROXICODONE ) immediate release tablet 5 mg, 5 mg, Oral, Q6H PRN, Niu, Xilin, MD, 5 mg at 01/30/24 0515   pantoprazole  (PROTONIX ) EC tablet 40 mg, 40 mg, Oral, Daily, Niu, Xilin, MD, 40 mg at 01/30/24 9147   PARoxetine  (PAXIL ) tablet 40 mg, 40 mg, Oral, Daily, Von Bellis, MD, 40 mg at 01/30/24 9147   piperacillin -tazobactam (ZOSYN ) IVPB 3.375 g, 3.375 g, Intravenous, Q8H, Niu, Xilin, MD, Last Rate: 12.5 mL/hr at 01/30/24 0856, 3.375 g at 01/30/24 0856   polyethylene glycol-electrolytes (NuLYTELY ) solution 4,000 mL, 4,000 mL, Oral, Once, Locklear, Ole DASEN, MD   ranolazine  (RANEXA ) 12 hr tablet 1,000 mg, 1,000 mg, Oral, BID, Niu, Xilin, MD, 1,000 mg at 01/30/24 9147   saccharomyces boulardii (FLORASTOR) capsule 250 mg, 250 mg, Oral, BID, Von Bellis, MD, 250 mg at 01/30/24 9145   SUMAtriptan  (IMITREX ) tablet 50 mg, 50 mg, Oral, Q2H PRN, Niu, Xilin, MD   topiramate  (TOPAMAX ) tablet 100 mg, 100 mg, Oral, BID, Niu, Xilin, MD, 100 mg at 01/30/24 9146   Vitamin D  (Ergocalciferol ) (DRISDOL ) 1.25 MG (50000 UNIT) capsule 50,000 Units, 50,000 Units, Oral, Q7 days, Von Bellis, MD, 50,000 Units at 01/27/24 1235   zolpidem  (AMBIEN ) tablet 5 mg, 5 mg, Oral, QHS PRN, Niu, Xilin, MD  Allergies: Allergies  Allergen Reactions   Depakote [Divalproex Sodium] Shortness Of Breath    Pregabalin Other (See Comments), Rash and Shortness Of Breath    Other Reaction: falls; decreased coordination  Pt ts that she passes out when she takes this med.   Other Reaction: falls; decreased coordination    Pt ts that she passes out when she takes this med.  Other Reaction:  falls; decreased coordination, , Pt ts that she passes out when she takes this med.   Valproic Acid  Other (See Comments) and Shortness Of Breath    Other Reaction: Other reaction  SOB   Levetiracetam Itching and Rash    Daine KATHEE Ober, NP

## 2024-01-30 NOTE — Progress Notes (Signed)
 Subjective:  CC: Diana Quinn is a 58 y.o. female  Hospital stay day 4,   diffuse abdominal pain and diarrhea found to have colitis, complicated by pertinent comorbidities including extensive previous abdominal surgery.   HPI: Episode of urinary retention noted overnight.  Per patient report, she was able to void prior to straight cathing when she had to have a bowel movement.  Currently she states the pain has improved slightly compared to yesterday.  ROS:  Pertinent positives and negatives noted in the HPI.    Objective:   Temp:  [98 F (36.7 C)-99.4 F (37.4 C)] 98.4 F (36.9 C) (11/22 0421) Pulse Rate:  [72-87] 72 (11/22 0421) Resp:  [16-20] 18 (11/22 0421) BP: (107-134)/(53-107) 107/72 (11/22 0421) SpO2:  [96 %-97 %] 96 % (11/22 0421)     Height: 5' 3 (160 cm) Weight: 92.9 kg BMI (Calculated): 36.29   Intake/Output this shift:   Intake/Output Summary (Last 24 hours) at 01/30/2024 0745 Last data filed at 01/30/2024 0433 Gross per 24 hour  Intake --  Output 950 ml  Net -950 ml    Constitutional :  alert, cooperative, appears stated age, and no distress  Respiratory:  Clear to auscultation bilaterally  Cardiovascular:  Regular rate and rhythm  Gastrointestinal: soft, non-tender; bowel sounds normal; no masses,  no organomegaly.   Skin: Cool and moist.  Psychiatric: Normal affect, non-agitated, not confused       LABS:     Latest Ref Rng & Units 01/30/2024    4:35 AM 01/29/2024    4:40 AM 01/28/2024    5:54 AM  CMP  Glucose 70 - 99 mg/dL 888  894  887   BUN 6 - 20 mg/dL 11  12  22    Creatinine 0.44 - 1.00 mg/dL 9.06  9.09  8.95   Sodium 135 - 145 mmol/L 138  134  133   Potassium 3.5 - 5.1 mmol/L 3.6  4.0  3.9   Chloride 98 - 111 mmol/L 101  105  104   CO2 22 - 32 mmol/L 23  17  22    Calcium  8.9 - 10.3 mg/dL 7.8  7.5  7.0       Latest Ref Rng & Units 01/30/2024    4:35 AM 01/29/2024    4:40 AM 01/28/2024    5:54 AM  CBC  WBC 4.0 - 10.5 K/uL 15.5   12.4  12.4   Hemoglobin 12.0 - 15.0 g/dL 9.9  9.5  9.0   Hematocrit 36.0 - 46.0 % 29.0  27.9  26.6   Platelets 150 - 400 K/uL 307  262  224     RADS: N/a Assessment:   diffuse abdominal pain and diarrhea found to have colitis, complicated by pertinent comorbidities including extensive previous abdominal surgery.   Leukocytosis noted, potentially reactive to the episode of urinary retention last night.  Bedside clinical exam patient shows no signs of worsening abdominal pain and no indications for immediate surgery.  Will defer to GI service for further evaluation as noted on consult note.  Continue present management until further updates by GI service.  Surgery will continue to follow and be available if needed.  labs/images/medications/previous chart entries reviewed personally and relevant changes/updates noted above.

## 2024-01-30 NOTE — Progress Notes (Signed)
 Progress Note   Patient: Diana Quinn FMW:[id number] DOB: [birth year] DOA: 01/25/2024     4 DOS: the patient was seen and examined on 01/30/2024   Brief hospital course: Partly taken from prior notes.  Diana Quinn is a 58 y.o. female with medical history significant of HTN, HLD, DM, CAD, hypothyroidism, depression with anxiety, bipolar, schizophrenia, CKD stage II, A-fib not on anticoagulants, seizure, who presents with abdominal pain, diarrhea.   On presentation hemodynamically stable, labs with WBC of 17.1, worsening renal function, lactic acid 2.1, lipase 14.   pseudo hyponatremia secondary to hyperglycemia. CT abdomen and pelvis consistent with colitis involving the ascending, transverse and descending colon, without pneumatosis or perforation. Also noted appendiceal wall thickening and a chronic appearing L1 compression fracture.  General surgery was also consulted and they do not think that she has any concern of acute appendicitis and recommending conservative management.  She was started on Zosyn .  Cultures were ordered.  11/18: Vital stable, leukocytosis resolved, potassium of 5.2, CO2 18, lactic acid 2.0, BUN 28, creatinine 1.78, UDS negative, preliminary blood cultures negative in 12 hours. Patient did not had any more diarrhea, C. difficile and GI pathogen ordered but never collected. Discontinuing Dilaudid  as causing a lot of sedation.  PT was also ordered.  Family has more social concern related to her.  Message sent to social worker to visit her tomorrow in the presence of son.  Assessment and Plan:  # Acute colitis - Negative C. difficile and GI pathogen panel -Continue with Zosyn   -Advance diet as tolerated -Discontinue Dilaudid  -Continue with p.o. pain management -Avoid narcotics as possible Continue IV fluid for hydration Started probiotics 11/20 repeat CT a/p: Shows colitis, no new acute finding Fecal calprotectin is elevated 2350 GI consulted,  suspect Erisman colitis versus stercoral colitis given the history of constipation and requiring Linzess.  GI recommended colonoscopy Started clear liquid diet today and GI prep today, colonoscopy tomorrow a.m.   AKI (acute kidney injury) Creatinine 1.78> 1.53>1.04 Baseline sCr around 1.  Likely secondary to dehydration with GI losses. - Continue IV fluid -Monitor renal function -Avoid nephrotoxins 11/21 mild metabolic acidosis, CO2 17, bicarbonate IV 100 mEq x 1 dose IV push given. 11/22 acidosis resolved Check chemistry daily  # Urinary retention US  renal: No acute renal abnormality identified to explain acute kidney injury.  Bladder scan showed 575 mL 11/19, Foley catheter was inserted  Atrial fibrillation, chronic (HCC) Mild tachycardia.  Not  on any anticoagulation. - Continue to monitor  CAD (coronary artery disease) Patient has a history of CAD.  No chest pain. -Continue home Lipitor, Ranexa   Seizure (HCC) -Continue home Topamax  and Trileptal  11/20 held gabapentin  as per Neuro -Seizure precautions 11/20 patient is having waxing and waning altered mental status with generalized body twitching. Order placed for EEG Neurology consulted to review medications for possibility of polypharmacy Neuro recommended to hold off gabapentin  for now in view of renal failure and restarted lower dose.   Hypothyroidism - Continue home Synthroid   Essential hypertension Blood pressure on lower normal goal. Not on any medications at home. - Continue to monitor  Hyperkalemia: Resolved Potassium of 5.2>> 4.3 - s/p IV fluid and Lokelma  -Continue to monitor  # Hypophosphatemia, Phos repleted Monitor electrolytes and replete as needed   Type II diabetes mellitus (HCC) CBG within goal. - SSI  HLD (hyperlipidemia) - Continue Lipitor  Depression with anxiety History of bipolar disorder and Schizophrenia. - Continue home medications which include Paxil , as needed clonazepam ,  trazodone , Seroquel  and Caplyta . 11/20 discontinued trazodone  and Seroquel  as patient was very sleepy and dozing off, having significant twitching of whole body. Psych consulted to review medications and reduce polypharmacy Psych recommended to continue to hold off trazodone  and Seroquel  for now and resume same dose on discharge as it was helping patient.  # Vitamin D  insufficiency, vitamin D  level 22, started vitamin D  55 units p.o. weekly  # History of vitamin B12 deficiency on B12 monthly injection. B12 level 370, goal 400, started B12 injection during hospital stay for   Subjective: No significant event overnight, patient is awake and alert, stated abdominal pain is 5/10, it goes higher 7/10 on movement.  Overall she is feeling better. Agreed for colonoscopy as discussed by GI.    Physical Exam: Vitals:   01/29/24 2200 01/30/24 0421 01/30/24 0814 01/30/24 1546  BP: 128/78 107/72 107/66 (!) 105/55  Pulse:  72 77 68  Resp:  18 14 16   Temp:  98.4 F (36.9 C) 97.8 F (36.6 C) 98.6 F (37 C)  TempSrc:  Oral    SpO2:  96% 98% 98%  Weight:      Height:       General.  NAD, lying comfortably Pulmonary.  Lungs clear bilaterally, normal respiratory effort. CV.  Regular rate and rhythm, no JVD, rub or murmur. Abdomen.  Soft, BS positive, ND, generalized tenderness  CNS: Awake and alert today. No focal neurologic deficit.  Generalized body twitching noticed Extremities.  No edema  Data Reviewed: Prior data reviewed  Family Communication: Discussed with patient  Disposition: Status is: Inpatient Remains inpatient appropriate because: Severity of illness  Planned Discharge Destination: Home  DVT prophylaxis.  Subcu heparin   Time spent: 40 minutes  This record has been created using Conservation officer, historic buildings. Errors have been sought and corrected,but may not always be located. Such creation errors do not reflect on the standard of care.    Author: Elvan Sor,  MD 01/30/2024 4:41 PM  For on call review [email or web address].

## 2024-01-30 NOTE — Progress Notes (Signed)
 GI Inpatient Follow-up Note  Subjective:  Patient seen and is much more alert today. Still with abdominal pain. Per patient, had colitis as a kid with the treatment being prune juice. 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  Scheduled Inpatient Medications:   atorvastatin   40 mg Oral Daily   Chlorhexidine  Gluconate Cloth  6 each Topical Daily   cyanocobalamin   1,000 mcg Intramuscular Daily   gabapentin   600 mg Oral BID   insulin  aspart  0-5 Units Subcutaneous QHS   insulin  aspart  0-9 Units Subcutaneous TID WC   levothyroxine   112 mcg Oral Q0600   lumateperone  tosylate  42 mg Oral Daily   multivitamin with minerals  1 tablet Oral Daily   Oxcarbazepine   600 mg Oral BID   pantoprazole   40 mg Oral Daily   PARoxetine   40 mg Oral Daily   ranolazine   1,000 mg Oral BID   saccharomyces boulardii  250 mg Oral BID   topiramate   100 mg Oral BID   Vitamin D  (Ergocalciferol )  50,000 Units Oral Q7 days    Continuous Inpatient Infusions:    piperacillin -tazobactam (ZOSYN )  IV 3.375 g (01/30/24 0856)    PRN Inpatient Medications:  acetaminophen , butalbital -acetaminophen -caffeine , clonazePAM , hydrALAZINE , LORazepam , ondansetron  (ZOFRAN ) IV, oxyCODONE , SUMAtriptan , zolpidem   Review of Systems:  Review of Systems  Constitutional:  Negative for chills and fever.  Respiratory:  Negative for shortness of breath.   Cardiovascular:  Negative for chest pain.  Gastrointestinal:  Positive for abdominal pain. Negative for blood in stool, nausea and vomiting.  Musculoskeletal:  Positive for joint pain.  Skin:  Negative for rash.  Neurological:  Negative for focal weakness.  Psychiatric/Behavioral:  Negative for substance abuse.   All other systems reviewed and are negative.      Physical Examination: BP 107/66 (BP Location: Left Arm)   Pulse 77   Temp 97.8 F (36.6 C)   Resp 14   Ht 5' 3 (1.6 m)   Wt 92.9 kg   SpO2 98%   BMI 36.28 kg/m  Gen: NAD, alert and oriented x 4 HEENT: PEERLA,  EOMI, Neck: supple, no JVD or thyromegaly Chest: No respiratory distress Abd: soft, mild tenderness Ext: no edema, well perfused with 2+ pulses, Skin: no rash or lesions noted Lymph: no LAD  Data: Lab Results  Component Value Date   WBC 15.5 (H) 01/30/2024   HGB 9.9 (L) 01/30/2024   HCT 29.0 (L) 01/30/2024   MCV 90.3 01/30/2024   PLT 307 01/30/2024   Recent Labs  Lab 01/28/24 0554 01/29/24 0440 01/30/24 0435  HGB 9.0* 9.5* 9.9*   Lab Results  Component Value Date   NA 138 01/30/2024   K 3.6 01/30/2024   CL 101 01/30/2024   CO2 23 01/30/2024   BUN 11 01/30/2024   CREATININE 0.93 01/30/2024   Lab Results  Component Value Date   ALT 36 01/25/2024   AST 51 (H) 01/25/2024   ALKPHOS 168 (H) 01/25/2024   BILITOT 0.4 01/25/2024   Recent Labs  Lab 01/26/24 0157  APTT 26  INR 1.1   Assessment/Plan: Diana Quinn is a 58 y.o.  lady with multiple medical problems including gastric bypass who is here with abdominal pain and imaging findings of colitis. Suspect ischemic colitis/stercoral colitis given history of constipation requiring linzess. Will plan on colonoscopy tomorrow.  Recommendations:  - clear liquids, NPO at midnight - will order prep - recommend discontinuing opioid pain medicine - colonoscopy tomorrow  Please call with any questions or concerns.  Frederic  Jaeven Wanzer MD, MPH [facility] GI

## 2024-01-31 ENCOUNTER — Inpatient Hospital Stay: Admitting: General Practice

## 2024-01-31 ENCOUNTER — Encounter
Admission: EM | Disposition: A | Discharge status: Home Health | Payer: Self-pay | Source: Home / Self Care | Attending: Student

## 2024-01-31 DIAGNOSIS — K529 Noninfective gastroenteritis and colitis, unspecified: Secondary | ICD-10-CM | POA: Diagnosis not present

## 2024-01-31 HISTORY — PX: COLONOSCOPY: SHX5424

## 2024-01-31 LAB — CULTURE, BLOOD (ROUTINE X 2)
Culture: NO GROWTH
Culture: NO GROWTH
Special Requests: ADEQUATE

## 2024-01-31 LAB — CBC
HCT: 24.5 % — ABNORMAL LOW (ref 36.0–46.0)
Hemoglobin: 8.5 g/dL — ABNORMAL LOW (ref 12.0–15.0)
MCH: 31.3 pg (ref 26.0–34.0)
MCHC: 34.7 g/dL (ref 30.0–36.0)
MCV: 90.1 fL (ref 80.0–100.0)
Platelets: 310 K/uL (ref 150–400)
RBC: 2.72 MIL/uL — ABNORMAL LOW (ref 3.87–5.11)
RDW: 15 % (ref 11.5–15.5)
WBC: 19.7 K/uL — ABNORMAL HIGH (ref 4.0–10.5)
nRBC: 0.2 % (ref 0.0–0.2)

## 2024-01-31 LAB — BASIC METABOLIC PANEL WITH GFR
Anion gap: 8 (ref 5–15)
BUN: 9 mg/dL (ref 6–20)
CO2: 21 mmol/L — ABNORMAL LOW (ref 22–32)
Calcium: 7.7 mg/dL — ABNORMAL LOW (ref 8.9–10.3)
Chloride: 103 mmol/L (ref 98–111)
Creatinine, Ser: 0.75 mg/dL (ref 0.44–1.00)
GFR, Estimated: >60 mL/min (ref >60)
Glucose, Bld: 109 mg/dL — ABNORMAL HIGH (ref 70–99)
Potassium: 4.1 mmol/L (ref 3.5–5.1)
Sodium: 133 mmol/L — ABNORMAL LOW (ref 135–145)

## 2024-01-31 LAB — MAGNESIUM: Magnesium: 2.1 mg/dL (ref 1.7–2.4)

## 2024-01-31 LAB — GLUCOSE, CAPILLARY
Glucose-Capillary: 127 mg/dL — ABNORMAL HIGH (ref 70–99)
Glucose-Capillary: 97 mg/dL (ref 70–99)
Glucose-Capillary: 190 mg/dL — ABNORMAL HIGH (ref 70–99)
Glucose-Capillary: 128 mg/dL — ABNORMAL HIGH (ref 70–99)

## 2024-01-31 LAB — PHOSPHORUS: Phosphorus: 2.5 mg/dL (ref 2.5–4.6)

## 2024-01-31 LAB — PROCALCITONIN: Procalcitonin: 0.48 ng/mL

## 2024-01-31 SURGERY — COLONOSCOPY
Anesthesia: General

## 2024-01-31 MED ORDER — SODIUM CHLORIDE 0.9 % IV SOLN: INTRAVENOUS | Status: DC

## 2024-01-31 MED ORDER — LIDOCAINE 2% (20 MG/ML) 5 ML SYRINGE
INTRAMUSCULAR | Status: DC | PRN
  Administered 2024-01-31: 20 mg via INTRAVENOUS

## 2024-01-31 MED ORDER — HYDROMORPHONE HCL 1 MG/ML IJ SOLN
0.5000 mg | Freq: Once | INTRAMUSCULAR | Status: AC | PRN
  Administered 2024-01-31: 0.5 mg via INTRAVENOUS
  Filled 2024-01-31: qty 0.5

## 2024-01-31 MED ORDER — BISACODYL 10 MG RE SUPP: 10.0000 mg | Freq: Every day | RECTAL | Status: DC | PRN

## 2024-01-31 MED ORDER — PROPOFOL 10 MG/ML IV BOLUS
INTRAVENOUS | Status: DC | PRN
  Administered 2024-01-31: 100 mg via INTRAVENOUS

## 2024-01-31 MED ORDER — MIDAZOLAM HCL 2 MG/2ML IJ SOLN
INTRAMUSCULAR | Status: AC
Start: 2024-01-31 — End: 2024-01-31
  Filled 2024-01-31: qty 2

## 2024-01-31 MED ORDER — POLYETHYLENE GLYCOL 3350 17 G PO PACK
17.0000 g | PACK | Freq: Two times a day (BID) | ORAL | Status: DC
  Administered 2024-02-02: 17 g via ORAL
  Filled 2024-01-31 (×7): qty 1

## 2024-01-31 MED ORDER — MIDAZOLAM HCL 5 MG/5ML IJ SOLN
INTRAMUSCULAR | Status: DC | PRN
  Administered 2024-01-31: 2 mg via INTRAVENOUS

## 2024-01-31 MED ORDER — PROPOFOL 1000 MG/100ML IV EMUL
INTRAVENOUS | Status: AC
  Filled 2024-01-31: qty 100

## 2024-01-31 MED ORDER — BISACODYL 5 MG PO TBEC
10.0000 mg | DELAYED_RELEASE_TABLET | Freq: Every day | ORAL | Status: DC
  Filled 2024-01-31 (×4): qty 2

## 2024-01-31 MED ORDER — PROPOFOL 500 MG/50ML IV EMUL
INTRAVENOUS | Status: DC | PRN
  Administered 2024-01-31: 120 ug/kg/min via INTRAVENOUS

## 2024-01-31 NOTE — Plan of Care (Signed)

## 2024-01-31 NOTE — Transfer of Care (Signed)
 Immediate Anesthesia Transfer of Care Note  Patient: Diana Quinn  Procedure(s) Performed: COLONOSCOPY  Patient Location: Endoscopy Unit  Anesthesia Type:General  Level of Consciousness: sedated  Airway & Oxygen Therapy: Patient Spontanous Breathing and Patient connected to nasal cannula oxygen  Post-op Assessment: Report given to RN and Post -op Vital signs reviewed and stable  Post vital signs: Reviewed  Last Vitals:  Vitals Value Taken Time  BP 90/57   Temp    Pulse 69   Resp 16   SpO2 100     Last Pain:  Vitals:   01/31/24 0855  TempSrc:   PainSc: Asleep      Patients Stated Pain Goal: 0 (01/29/24 2115)  Complications: No notable events documented.

## 2024-01-31 NOTE — Progress Notes (Addendum)
 Subjective:  CC: Diana Quinn is a 58 y.o. female  Hospital stay day 5,  diffuse abdominal pain and diarrhea found to have colitis, complicated by pertinent comorbidities including extensive previous abdominal surgery.   HPI: Status post endoscopy.  Patient states she is feeling much improved even compared to yesterday.  Hungry.  ROS:  Pertinent positives and negatives noted in the HPI.    Objective:   Temp:  [98.1 F (36.7 C)-98.6 F (37 C)] 98.1 F (36.7 C) (11/23 0900) Pulse Rate:  [64-77] 69 (11/23 1254) Resp:  [16-18] 18 (11/23 1254) BP: (99-120)/(55-62) 107/56 (11/23 1314) SpO2:  [96 %-100 %] 100 % (11/23 1254)     Height: 5' 3 (160 cm) Weight: 92.9 kg BMI (Calculated): 36.29   Intake/Output this shift:   Intake/Output Summary (Last 24 hours) at 01/31/2024 1435 Last data filed at 01/31/2024 1249 Gross per 24 hour  Intake 740 ml  Output --  Net 740 ml    Constitutional :  alert, cooperative, appears stated age, and no distress  Respiratory:  Clear to auscultation bilaterally  Cardiovascular:  Regular rate and rhythm  Gastrointestinal: soft, non-tender; bowel sounds normal; no masses,  no organomegaly.   Skin: Cool and moist.  Psychiatric: Normal affect, non-agitated, not confused       LABS:     Latest Ref Rng & Units 01/31/2024    6:51 AM 01/30/2024    4:35 AM 01/29/2024    4:40 AM  CMP  Glucose 70 - 99 mg/dL 890  888  894   BUN 6 - 20 mg/dL 9  11  12    Creatinine 0.44 - 1.00 mg/dL 9.24  9.06  9.09   Sodium 135 - 145 mmol/L 133  138  134   Potassium 3.5 - 5.1 mmol/L 4.1  3.6  4.0   Chloride 98 - 111 mmol/L 103  101  105   CO2 22 - 32 mmol/L 21  23  17    Calcium  8.9 - 10.3 mg/dL 7.7  7.8  7.5       Latest Ref Rng & Units 01/31/2024    6:51 AM 01/30/2024    4:35 AM 01/29/2024    4:40 AM  CBC  WBC 4.0 - 10.5 K/uL 19.7  15.5  12.4   Hemoglobin 12.0 - 15.0 g/dL 8.5  9.9  9.5   Hematocrit 36.0 - 46.0 % 24.5  29.0  27.9   Platelets 150 - 400  K/uL 310  307  262     RADS: N/a Assessment:   diffuse abdominal pain and diarrhea found to have colitis, complicated by pertinent comorbidities including extensive previous abdominal surgery.   Despite increasing white count, patient states she is clinically better and abdominal exam is unimpressive again today.  Will defer to GI service for continued evaluation.  No surgical intervention needed at this time  labs/images/medications/previous chart entries reviewed personally and relevant changes/updates noted above.

## 2024-01-31 NOTE — Op Note (Signed)
 [facility] Patient Name: Diana Quinn Procedure Date: 01/31/2024 11:13 AM MRN: [id number] Account #: [id number] Date of Birth: [birth year] Admit Type: Inpatient Age: 58 Room: [facility] ENDO ROOM 4 Gender: Female Note Status: Finalized Instrument Name: Colon Scope [id number] Procedure:             Colonoscopy Indications:           Abnormal CT of the GI tract Providers:             Ole Schick MD, MD Medicines:             Monitored Anesthesia Care Complications:         No immediate complications. Estimated blood loss:                         Minimal. Procedure:             Pre-Anesthesia Assessment:                        - Prior to the procedure, a History and Physical was                         performed, and patient medications and allergies were                         reviewed. The patient is competent. The risks and                         benefits of the procedure and the sedation options and                         risks were discussed with the patient. All questions                         were answered and informed consent was obtained.                         Patient identification and proposed procedure were                         verified by the physician, the nurse, the                         anesthesiologist, the anesthetist and the technician                         in the endoscopy suite. Mental Status Examination:                         alert and oriented. Airway Examination: normal                         oropharyngeal airway and neck mobility. Respiratory                         Examination: clear to auscultation. CV Examination:                         normal. Prophylactic Antibiotics: The patient does not  require prophylactic antibiotics. Prior                         Anticoagulants: The patient has taken no anticoagulant                         or antiplatelet agents. ASA Grade Assessment: III - A                          patient with severe systemic disease. After reviewing                         the risks and benefits, the patient was deemed in                         satisfactory condition to undergo the procedure. The                         anesthesia plan was to use monitored anesthesia care                         (MAC). Immediately prior to administration of                         medications, the patient was re-assessed for adequacy                         to receive sedatives. The heart rate, respiratory                         rate, oxygen saturations, blood pressure, adequacy of                         pulmonary ventilation, and response to care were                         monitored throughout the procedure. The physical                         status of the patient was re-assessed after the                         procedure.                        After obtaining informed consent, the colonoscope was                         passed under direct vision. Throughout the procedure,                         the patient's blood pressure, pulse, and oxygen                         saturations were monitored continuously. The                         Colonoscope was introduced through the anus with the  intention of advancing to the cecum. The scope was                         advanced to the transverse colon before the procedure                         was aborted. Medications were given. The colonoscopy                         was aborted due to poor bowel prep with stool present.                         The patient tolerated the procedure well. The quality                         of the bowel preparation was poor. Anatomical                         landmarks were photographed. Findings:      The perianal and digital rectal examinations were normal.      Multiple ulcers were found in the proximal transverse colon and at the       hepatic flexure. These  were large, ischemic appearing ulceration. No       bleeding was present. Biopsies were taken with a cold forceps for       histology. Estimated blood loss was minimal. Impression:            - The procedure was aborted due to poor bowel prep                         with stool present.                        - Preparation of the colon was poor.                        - Multiple ulcers in the proximal transverse colon and                         at the hepatic flexure. Biopsied. Recommendation:        - Return patient to hospital ward for ongoing care.                        - Advance diet as tolerated.                        - Continue present medications.                        - Await pathology results.                        - Will need aggressive bowel regimen to prevent                         constipation. Procedure Code(s):     --- Professional ---                        [phone],  52, Colonoscopy, flexible; with biopsy, single                         or multiple Diagnosis Code(s):     --- Professional ---                        K63.3, Ulcer of intestine                        R93.3, Abnormal findings on diagnostic imaging of                         other parts of digestive tract CPT copyright 2022 American Medical Association. All rights reserved. The codes documented in this report are preliminary and upon coder review may  be revised to meet current compliance requirements. Ole Schick MD, MD 01/31/2024 12:56:56 PM Number of Addenda: 0 Note Initiated On: 01/31/2024 11:13 AM Total Procedure Duration: 0 hours 6 minutes 27 seconds  Estimated Blood Loss:  Estimated blood loss was minimal.      [facility]

## 2024-01-31 NOTE — Anesthesia Preprocedure Evaluation (Addendum)
 Anesthesia Evaluation  Patient identified by MRN, date of birth, ID band Patient awake    Reviewed: Allergy & Precautions, NPO status , Patient's Chart, lab work & pertinent test results  Airway Mallampati: III  TM Distance: >3 FB Neck ROM: full    Dental  (+) Chipped   Pulmonary neg pulmonary ROS   Pulmonary exam normal        Cardiovascular hypertension, + CAD and + Past MI  + dysrhythmias Atrial Fibrillation      Neuro/Psych  Headaches, Seizures -,  PSYCHIATRIC DISORDERS Anxiety Depression Bipolar Disorder Schizophrenia  TIA Neuromuscular disease    GI/Hepatic Neg liver ROS,GERD  Medicated and Controlled,,  Endo/Other  diabetesHypothyroidism    Renal/GU negative Renal ROS  negative genitourinary   Musculoskeletal   Abdominal   Peds  Hematology  (+) Blood dyscrasia, anemia   Anesthesia Other Findings Past Medical History: 12/12/2014: Abdominal pain, acute, right upper quadrant 05/10/2014: Abnormal weight loss 09/23/2022: Acute metabolic encephalopathy 09/17/2016: Allergic reaction 06/12/2023: AMS (altered mental status) No date: Anemia 09/10/2022: Ankle fracture 01/14/2015: Appetite loss 09/25/2022: Bilateral ankle fractures 08/10/2020: CAD (coronary artery disease) 09/10/2022: Closed fracture of left distal tibia 04/13/2019: Closed torus fracture of upper end of right fibula 09/18/2022: E. coli UTI 02/27/2013: EKG, abnormal 05/10/2014: Elevated lithium  level No date: Gastric bypass status for obesity No date: H/O unstable angina 09/24/2022: History of seizure 09/30/2014: Hx SBO No date: Hypoglycemia 10/03/2022: Hypokalemia 09/17/2022: Hyponatremia No date: MI (myocardial infarction) (HCC) 03/24/2011: Migraine headache     Comment:  Note: Unchanged - sees [facility] neurology, Dr. German  Note:              Unchanged - sees [facility] neurolog[facility], Dr. German Deal of this note might be  different from the               original.  Note: Unchanged - sees [facility] neurology, Dr.               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 No date: Seizures (HCC) 12/29/2014: Skin infection at gastrostomy tube site [facility]) 02/27/2013: Suicidal behavior No date: Thyroid  disease 04/22/2017: TIA (transient ischemic attack)  Past Surgical History: No date: ABDOMINAL ADHESION SURGERY No date: ABDOMINAL HYSTERECTOMY No date: CHOLECYSTECTOMY No date: DILATION AND CURETTAGE OF UTERUS No date: ESOPHAGOGASTRODUODENOSCOPY No date: GASTRIC BYPASS No date: GASTRIC BYPASS OPEN     Comment:  revision No date: GASTROSTOMY W/ FEEDING TUBE No date: HERNIA REPAIR No date: OOPHORECTOMY No date: SPLENECTOMY No date: TONSILLECTOMY  BMI    Body Mass Index: 36.28 kg/m      Reproductive/Obstetrics negative OB ROS                              Anesthesia Physical Anesthesia Plan  ASA: 3  Anesthesia Plan: General   Post-op Pain Management: Minimal or no pain anticipated   Induction: Intravenous  PONV Risk Score and Plan: 2 and Propofol  infusion and TIVA  Airway Management Planned: Nasal Cannula  Additional Equipment: None  Intra-op Plan:   Post-operative Plan:   Informed  Consent: I have reviewed the patients History and Physical, chart, labs and discussed the procedure including the risks, benefits and alternatives for the proposed anesthesia with the patient or authorized representative who has indicated his/her understanding and acceptance.     Dental advisory given  Plan  Discussed with: CRNA and Surgeon  Anesthesia Plan Comments: (Discussed risks of anesthesia with patient, including possibility of difficulty with spontaneous ventilation under anesthesia necessitating airway intervention, PONV, and rare risks such as cardiac or respiratory or neurological events, and allergic reactions. Discussed the role of CRNA in patient's perioperative care. Patient understands.)         Anesthesia Quick Evaluation

## 2024-01-31 NOTE — Anesthesia Postprocedure Evaluation (Signed)
 Anesthesia Post Note  Patient: Reya Aurich  Procedure(s) Performed: COLONOSCOPY  Patient location during evaluation: Endoscopy Anesthesia Type: General Level of consciousness: awake and alert Pain management: pain level controlled Vital Signs Assessment: post-procedure vital signs reviewed and stable Respiratory status: spontaneous breathing, nonlabored ventilation, respiratory function stable and patient connected to nasal cannula oxygen Cardiovascular status: blood pressure returned to baseline and stable Postop Assessment: no apparent nausea or vomiting Anesthetic complications: no   No notable events documented.   Last Vitals:  Vitals:   01/31/24 1314 01/31/24 1526  BP: (!) 107/56 95/76  Pulse:  80  Resp:  16  Temp:  36.6 C  SpO2:  99%    Last Pain:  Vitals:   01/31/24 1416  TempSrc:   PainSc: 6                  Debby Mines

## 2024-01-31 NOTE — Progress Notes (Signed)
 Progress Note   Patient: Diana Quinn DOB: [birth year] DOA: 01/25/2024     5 DOS: the patient was seen and examined on 01/31/2024   Brief hospital course: Partly taken from prior notes.  Diana Quinn is a 58 y.o. female with medical history significant of HTN, HLD, DM, CAD, hypothyroidism, depression with anxiety, bipolar, schizophrenia, CKD stage II, A-fib not on anticoagulants, seizure, who presents with abdominal pain, diarrhea.   On presentation hemodynamically stable, labs with WBC of 17.1, worsening renal function, lactic acid 2.1, lipase 14.   pseudo hyponatremia secondary to hyperglycemia. CT abdomen and pelvis consistent with colitis involving the ascending, transverse and descending colon, without pneumatosis or perforation. Also noted appendiceal wall thickening and a chronic appearing L1 compression fracture.  General surgery was also consulted and they do not think that she has any concern of acute appendicitis and recommending conservative management.  She was started on Zosyn .  Cultures were ordered.  11/18: Vital stable, leukocytosis resolved, potassium of 5.2, CO2 18, lactic acid 2.0, BUN 28, creatinine 1.78, UDS negative, preliminary blood cultures negative in 12 hours. Patient did not had any more diarrhea, C. difficile and GI pathogen ordered but never collected. Discontinuing Dilaudid  as causing a lot of sedation.  PT was also ordered.  Family has more social concern related to her.  Message sent to social worker to visit her tomorrow in the presence of son.  Assessment and Plan:  # Acute colitis - Negative C. difficile and GI pathogen panel -Continue with Zosyn   -Advance diet as tolerated -Discontinue Dilaudid  -Continue with p.o. pain management -Avoid narcotics as possible S/p IV fluid for hydration Started probiotics 11/20 repeat CT a/p: Shows colitis, no new acute finding Fecal calprotectin is elevated 2350 GI consulted, s/p  colonoscopy done on 11/23 Colonoscopy: Preparation of the colon was poor. Multiple ulcers in the proximal transverse colon and at the hepatic flexure. Biopsied.  GI recommended to advance diet and follow-up as an outpatient for biopsy report.  Laxatives to prevent constipation  # Leukocytosis, most likely reactive WBC count 19.7, gradually getting worse Procalcitonin 0.48 very low, most likely leukocytosis is reactive   # AKI (acute kidney injury) Creatinine 1.78> 1.53>1.04 Baseline sCr around 1.  Likely secondary to dehydration with GI losses. - Continue IV fluid -Monitor renal function -Avoid nephrotoxins 11/21 mild metabolic acidosis, CO2 17, bicarbonate IV 100 mEq x 1 dose IV push given. 11/22 acidosis resolved Check chemistry daily  # Urinary retention US  renal: No acute renal abnormality identified to explain acute kidney injury. Bladder scan showed 575 mL 11/19 s/p Foley catheter.  Patient accidentally pulled out.  Currently voiding well.  Atrial fibrillation, chronic (HCC) Mild tachycardia.  Not  on any anticoagulation. - Continue to monitor  CAD (coronary artery disease) Patient has a history of CAD.  No chest pain. -Continue home Lipitor, Ranexa   Seizure (HCC) -Continue home Topamax  and Trileptal  11/20 held gabapentin  as per Neuro -Seizure precautions 11/20 patient is having waxing and waning altered mental status with generalized body twitching. EEG: No seizures, encephalopathy Neurology consulted to review medications for possibility of polypharmacy Neuro recommended to hold off gabapentin  for now in view of renal failure and restarted lower dose.   Hypothyroidism - Continue home Synthroid   H/o Essential hypertension, BP remained low Not on any medications at home. - Continue to monitor  Hyperkalemia: Resolved Potassium of 5.2>> 4.3 - s/p IV fluid and Lokelma  -Continue to monitor  # Hypophosphatemia, Phos repleted Monitor electrolytes and  replete as  needed   Type II diabetes mellitus (HCC) CBG within goal. - SSI  HLD (hyperlipidemia) - Continue Lipitor  Depression with anxiety History of bipolar disorder and Schizophrenia. - Continue home medications which include Paxil , as needed clonazepam , trazodone , Seroquel  and Caplyta . 11/20 discontinued trazodone  and Seroquel  as patient was very sleepy and dozing off, having significant twitching of whole body. Psych consulted to review medications and reduce polypharmacy Psych recommended to continue to hold off trazodone  and Seroquel  for now and resume same dose on discharge as it was helping patient.  # Vitamin D  insufficiency, vitamin D  level 22, started vitamin D  50000 units p.o. weekly  # History of vitamin B12 deficiency on B12 monthly injection. B12 level 370, goal 400, started B12 injection during hospital stay for   Subjective: No significant event overnight.  Patient was seen after colonoscopy, tolerated procedure well. Patient was complaining of right-sided abdominal pain on movement.  It seems musculoskeletal pain.   Physical Exam: Vitals:   01/31/24 0900 01/31/24 1254 01/31/24 1314 01/31/24 1526  BP: (!) 105/59 (!) 99/59 (!) 107/56 95/76  Pulse: 75 69  80  Resp: 16 18  16   Temp: 98.1 F (36.7 C)   97.8 F (36.6 C)  TempSrc:      SpO2: 97% 100%  99%  Weight:      Height:       General.  NAD, lying comfortably Pulmonary.  Lungs clear bilaterally, normal respiratory effort. CV.  Regular rate and rhythm, no JVD, rub or murmur. Abdomen.  Soft, BS positive, ND, mild abdominal wall tenderness on the right side   CNS: Awake and alert today. No focal neurologic deficit.  Extremities.  No edema  Data Reviewed: Prior data reviewed  Family Communication: Discussed with patient  Disposition: Status is: Inpatient Remains inpatient appropriate because: Severity of illness  Planned Discharge Destination: Home  DVT prophylaxis.  Subcu heparin   Time spent: 40  minutes  This record has been created using Conservation officer, historic buildings. Errors have been sought and corrected,but may not always be located. Such creation errors do not reflect on the standard of care.    Author: Elvan Sor, MD 01/31/2024 4:54 PM  For on call review [email or web address].

## 2024-02-01 ENCOUNTER — Inpatient Hospital Stay

## 2024-02-01 ENCOUNTER — Encounter: Payer: Self-pay | Admitting: Gastroenterology

## 2024-02-01 DIAGNOSIS — K529 Noninfective gastroenteritis and colitis, unspecified: Secondary | ICD-10-CM | POA: Diagnosis not present

## 2024-02-01 LAB — CULTURE, BLOOD (ROUTINE X 2)
Culture: NO GROWTH
Culture: NO GROWTH
Special Requests: ADEQUATE

## 2024-02-01 LAB — GLUCOSE, CAPILLARY
Glucose-Capillary: 107 mg/dL — ABNORMAL HIGH (ref 70–99)
Glucose-Capillary: 108 mg/dL — ABNORMAL HIGH (ref 70–99)
Glucose-Capillary: 117 mg/dL — ABNORMAL HIGH (ref 70–99)

## 2024-02-01 LAB — CBC
HCT: 26.9 % — ABNORMAL LOW (ref 36.0–46.0)
Hemoglobin: 9.1 g/dL — ABNORMAL LOW (ref 12.0–15.0)
MCH: 31 pg (ref 26.0–34.0)
MCHC: 33.8 g/dL (ref 30.0–36.0)
MCV: 91.5 fL (ref 80.0–100.0)
Platelets: 295 K/uL (ref 150–400)
RBC: 2.94 MIL/uL — ABNORMAL LOW (ref 3.87–5.11)
RDW: 15.6 % — ABNORMAL HIGH (ref 11.5–15.5)
WBC: 18.1 K/uL — ABNORMAL HIGH (ref 4.0–10.5)
nRBC: 0.3 % — ABNORMAL HIGH (ref 0.0–0.2)

## 2024-02-01 LAB — BASIC METABOLIC PANEL WITH GFR
Anion gap: 9 (ref 5–15)
BUN: 10 mg/dL (ref 6–20)
CO2: 19 mmol/L — ABNORMAL LOW (ref 22–32)
Calcium: 7.9 mg/dL — ABNORMAL LOW (ref 8.9–10.3)
Chloride: 106 mmol/L (ref 98–111)
Creatinine, Ser: 0.7 mg/dL (ref 0.44–1.00)
GFR, Estimated: >60 mL/min (ref >60)
Glucose, Bld: 96 mg/dL (ref 70–99)
Potassium: 3.5 mmol/L (ref 3.5–5.1)
Sodium: 134 mmol/L — ABNORMAL LOW (ref 135–145)

## 2024-02-01 LAB — PHOSPHORUS: Phosphorus: 2.8 mg/dL (ref 2.5–4.6)

## 2024-02-01 LAB — MAGNESIUM: Magnesium: 2.1 mg/dL (ref 1.7–2.4)

## 2024-02-01 MED ORDER — SODIUM BICARBONATE 650 MG PO TABS
650.0000 mg | ORAL_TABLET | Freq: Three times a day (TID) | ORAL | Status: DC
  Administered 2024-02-01 – 2024-02-02 (×5): 650 mg via ORAL
  Filled 2024-02-01 (×5): qty 1

## 2024-02-01 MED ORDER — HYDROMORPHONE HCL 2 MG PO TABS
2.0000 mg | ORAL_TABLET | ORAL | Status: DC | PRN
  Administered 2024-02-01 – 2024-02-03 (×9): 2 mg via ORAL
  Filled 2024-02-01 (×10): qty 1

## 2024-02-01 MED ORDER — HYDROMORPHONE HCL 1 MG/ML IJ SOLN: 0.5000 mg | Freq: Once | INTRAMUSCULAR | Status: DC | PRN

## 2024-02-01 MED ORDER — HYDROMORPHONE HCL 1 MG/ML IJ SOLN
1.0000 mg | INTRAMUSCULAR | Status: AC | PRN
  Administered 2024-02-01 (×2): 1 mg via INTRAVENOUS
  Filled 2024-02-01 (×2): qty 1

## 2024-02-01 MED ORDER — OXYCODONE HCL 5 MG PO TABS
5.0000 mg | ORAL_TABLET | Freq: Four times a day (QID) | ORAL | Status: DC | PRN
  Administered 2024-02-01: 5 mg via ORAL
  Filled 2024-02-01: qty 1

## 2024-02-01 NOTE — Plan of Care (Signed)

## 2024-02-01 NOTE — Progress Notes (Signed)
 [facility] SURGICAL PROGRESS NOTE (cpt [id number])  Hospital Day(s): 6.   Interval History: Patient seen and examined. No acute events over night. Patient reports she still has right abdominal pain this morning, seems to be worse since colonoscopy. No fever, chills, nausea, emesis. Leukocytosis seems to have plateaued and now improved this AM; WBC 18.1K (19.7K yesterday). Hgb to 9.1; improved in last 24 hours. Renal function remains normal; sCr - 070; UO - unmeasured. She continues on Zosyn . She continues to have bowel function; this is loose, non-bloody. Diet advanced.    Review of Systems:  Constitutional: denies fever, chills  HEENT: denies cough or congestion  Respiratory: denies any shortness of breath  Cardiovascular: denies chest pain or palpitations  Gastrointestinal: + abdominal pain; denied N/V Genitourinary: denies burning with urination or urinary frequency Musculoskeletal: denies pain, decreased motor or sensation  Vital signs in last 24 hours: [min-max] current  Temp:  [97.8 F (36.6 C)-98.7 F (37.1 C)] 98.1 F (36.7 C) (11/24 0458) Pulse Rate:  [69-81] 80 (11/24 0458) Resp:  [16-20] 20 (11/24 0458) BP: (95-120)/(56-76) 110/74 (11/24 0458) SpO2:  [97 %-100 %] 99 % (11/24 0458)     Height: 5' 3 (160 cm) Weight: 92.9 kg BMI (Calculated): 36.29   Intake/Output last 2 shifts:  11/23 0701 - 11/24 0700 In: 1374.8 [P.O.:720; I.V.:100; IV Piggyback:554.8] Out: -    Physical Exam:  Constitutional: She is alert, more oriented and appropriate this AM, NAD HENT: normocephalic without obvious abnormality  Eyes: PERRL, EOM's grossly intact and symmetric  Respiratory: breathing non-labored at rest Cardiovascular: regular rate and sinus rhythm  Gastrointestinal: soft, still with localized right sided abdominal tenderness this morning, no rebound/guarding. She is not overtly peritonitic   Labs:     Latest Ref Rng & Units 02/01/2024    5:52 AM 01/31/2024    6:51  AM 01/30/2024    4:35 AM  CBC  WBC 4.0 - 10.5 K/uL 18.1  19.7  15.5   Hemoglobin 12.0 - 15.0 g/dL 9.1  8.5  9.9   Hematocrit 36.0 - 46.0 % 26.9  24.5  29.0   Platelets 150 - 400 K/uL 295  310  307       Latest Ref Rng & Units 02/01/2024    5:52 AM 01/31/2024    6:51 AM 01/30/2024    4:35 AM  CMP  Glucose 70 - 99 mg/dL 96  890  888   BUN 6 - 20 mg/dL 10  9  11    Creatinine 0.44 - 1.00 mg/dL 9.29  9.24  9.06   Sodium 135 - 145 mmol/L 134  133  138   Potassium 3.5 - 5.1 mmol/L 3.5  4.1  3.6   Chloride 98 - 111 mmol/L 106  103  101   CO2 22 - 32 mmol/L 19  21  23    Calcium  8.9 - 10.3 mg/dL 7.9  7.7  7.8      Imaging studies:  No new imaging studies  Assessment/Plan: 57 y.o. female with diffuse abdominal pain and diarrhea found to have colitis, complicated by pertinent comorbidities including extensive previous abdominal surgery.   - Would cautiously continue diet given pain - Continue IV Abx (Zosyn )  - Appreciate GI assistance; colonoscopy reviewed   - Again, no need for emergent surgical intervention at this time. She understands this would involve subtotal colectomy and would likely be fairly morbid, she wishes to avoid this at all costs.              -  Monitor abdominal examination - Monitor renal function; normalized - Monitor leukocytosis; improving this AM; monitor  - Further management per primary service   All of the above findings and recommendations were discussed with the patient, and all of patient's questions were answered to their expressed satisfaction.  -- Arthea Platt, PA-C [facility] 02/01/2024, 7:58 AM M-F: 7am - 4pm

## 2024-02-01 NOTE — Progress Notes (Signed)
 Progress Note   Patient: Diana Quinn FMW:[id number] DOB: 1965-12-06 DOA: 01/25/2024     6 DOS: the patient was seen and examined on 02/01/2024   Brief hospital course: Partly taken from prior notes.  Diana Quinn is a 58 y.o. female with medical history significant of HTN, HLD, DM, CAD, hypothyroidism, depression with anxiety, bipolar, schizophrenia, CKD stage II, A-fib not on anticoagulants, seizure, who presents with abdominal pain, diarrhea.   On presentation hemodynamically stable, labs with WBC of 17.1, worsening renal function, lactic acid 2.1, lipase 14.   pseudo hyponatremia secondary to hyperglycemia. CT abdomen and pelvis consistent with colitis involving the ascending, transverse and descending colon, without pneumatosis or perforation. Also noted appendiceal wall thickening and a chronic appearing L1 compression fracture.  General surgery was also consulted and they do not think that she has any concern of acute appendicitis and recommending conservative management.  She was started on Zosyn .  Cultures were ordered.  11/18: Vital stable, leukocytosis resolved, potassium of 5.2, CO2 18, lactic acid 2.0, BUN 28, creatinine 1.78, UDS negative, preliminary blood cultures negative in 12 hours. Patient did not had any more diarrhea, C. difficile and GI pathogen ordered but never collected. Discontinuing Dilaudid  as causing a lot of sedation.  PT was also ordered.  Family has more social concern related to her.  Message sent to social worker to visit her tomorrow in the presence of son.  Assessment and Plan:  # Acute colitis - Negative C. difficile and GI pathogen panel -Continue with Zosyn   -Advance diet as tolerated -Discontinue Dilaudid  -Continue with p.o. pain management -Avoid narcotics as possible S/p IV fluid for hydration Started probiotics 11/20 repeat CT a/p: Shows colitis, no new acute finding Fecal calprotectin is elevated 2350 GI consulted, s/p  colonoscopy done on 11/23 Colonoscopy: Preparation of the colon was poor. Multiple ulcers in the proximal transverse colon and at the hepatic flexure. Biopsied.  GI recommended to advance diet and follow-up as an outpatient for biopsy report.  Laxatives to prevent constipation 11/24 still patient has significant abdominal pain, receiving IV pain medications.  Not stable to discharge. X-ray KUB: Possible colitis, did not report perforation  # Leukocytosis, most likely reactive WBC count 19.7, gradually getting worse Procalcitonin 0.48 very low, most likely leukocytosis is reactive   # AKI (acute kidney injury) Creatinine 1.78> 1.53>1.04 Baseline sCr around 1.  Likely secondary to dehydration with GI losses. - Continue IV fluid -Monitor renal function -Avoid nephrotoxins 11/21 mild metabolic acidosis, CO2 17, bicarbonate IV 100 mEq x 1 dose IV push given. 11/22 acidosis resolved Check chemistry daily  # Urinary retention US  renal: No acute renal abnormality identified to explain acute kidney injury. Bladder scan showed 575 mL 11/19 s/p Foley catheter.  Patient accidentally pulled out.  Currently voiding well.  Atrial fibrillation, chronic (HCC) Mild tachycardia.  Not  on any anticoagulation. - Continue to monitor  CAD (coronary artery disease) Patient has a history of CAD.  No chest pain. -Continue home Lipitor, Ranexa   Seizure (HCC) -Continue home Topamax  and Trileptal  11/20 held gabapentin  as per Neuro -Seizure precautions 11/20 patient is having waxing and waning altered mental status with generalized body twitching. EEG: No seizures, encephalopathy Neurology consulted to review medications for possibility of polypharmacy Neuro recommended to hold off gabapentin  for now in view of renal failure and restarted lower dose.   Hypothyroidism - Continue home Synthroid   H/o Essential hypertension, BP remained low Not on any medications at home. - Continue to  monitor  Hyperkalemia: Resolved Potassium of 5.2>> 4.3 - s/p IV fluid and Lokelma  -Continue to monitor  # Hypophosphatemia, Phos repleted Monitor electrolytes and replete as needed   Type II diabetes mellitus (HCC) CBG within goal. - SSI  HLD (hyperlipidemia) - Continue Lipitor  Depression with anxiety History of bipolar disorder and Schizophrenia. - Continue home medications which include Paxil , as needed clonazepam , trazodone , Seroquel  and Caplyta . 11/20 discontinued trazodone  and Seroquel  as patient was very sleepy and dozing off, having significant twitching of whole body. Psych consulted to review medications and reduce polypharmacy Psych recommended to continue to hold off trazodone  and Seroquel  for now and resume same dose on discharge as it was helping patient.  # Vitamin D  insufficiency, vitamin D  level 22, started vitamin D  50000 units p.o. weekly  # History of vitamin B12 deficiency on B12 monthly injection. B12 level 370, goal 400, started B12 injection during hospital stay for   Subjective: No significant event overnight.  Patient is constantly having pain in the right side of the abdomen.  Feels improvement with pain medications but does not feel comfortable going home and this condition. We will continue to monitor    Physical Exam: Vitals:   01/31/24 1526 01/31/24 2111 02/01/24 0458 02/01/24 0843  BP: 95/76 113/67 110/74 136/87  Pulse: 80 81 80 79  Resp: 16 20 20 16   Temp: 97.8 F (36.6 C) 98.7 F (37.1 C) 98.1 F (36.7 C) 98.2 F (36.8 C)  TempSrc:      SpO2: 99% 98% 99% 99%  Weight:      Height:       General.  NAD, lying comfortably Pulmonary.  Lungs clear bilaterally, normal respiratory effort. CV.  Regular rate and rhythm, no JVD, rub or murmur. Abdomen.  Soft, BS positive, ND, Right abd tenderness CNS: Awake and alert. No focal neurologic deficit.  Extremities.  No edema  Data Reviewed: Prior data reviewed  Family Communication:  Discussed with patient  Disposition: Status is: Inpatient Remains inpatient appropriate because: Severity of illness  Planned Discharge Destination: Home  DVT prophylaxis.  Subcu heparin   Time spent: 40 minutes  This record has been created using Conservation officer, historic buildings. Errors have been sought and corrected,but may not always be located. Such creation errors do not reflect on the standard of care.    Author: Elvan Sor, MD 02/01/2024 4:47 PM  For on call review [email or web address].

## 2024-02-01 NOTE — Progress Notes (Signed)
 PT Cancellation Note  Patient Details Name: Diana Quinn MRN: [id number] DOB: 1965/04/11   Cancelled Treatment:    Reason Eval/Treat Not Completed: Patient declined, no reason specified pt declined. Pt states I can't do therapy right now I'm in too much pain. PT will check back at later time.    Skyeler Scalese A Jasma Seevers 02/01/2024, 4:23 PM

## 2024-02-01 NOTE — TOC Progression Note (Signed)
 Transition of Care [facility]) - Progression Note    Patient Details  Name: Diana Quinn MRN: [id number] Date of Birth: 10-15-1965  Transition of Care [facility]) CM/SW Contact  Alfonso Rummer, LCSW Phone Number: 02/01/2024, 12:38 PM  Clinical Narrative:     LCSW A. Makeila Yamaguchi met with patient at bedside in room 223. Pt declines snf recommendations for second time. Pt reports she will go home and prefer home health. Pt reports her son or daughter in will provide transport when discharge.   Expected Discharge Plan: Home/Self Care Barriers to Discharge: Continued Medical Work up               Expected Discharge Plan and Services       Living arrangements for the past 2 months: Single Family Home                                       Social Drivers of Health (SDOH) Interventions SDOH Screenings   Food Insecurity: No Food Insecurity (01/26/2024)  Housing: High Risk (01/26/2024)  Transportation Needs: No Transportation Needs (01/26/2024)  Utilities: Not At Risk (01/26/2024)  Depression (PHQ2-9): Low Risk  (10/26/2018)  Social Connections: Socially Isolated (06/13/2023)  Tobacco Use: Low Risk  (01/26/2024)    Readmission Risk Interventions     No data to display

## 2024-02-02 ENCOUNTER — Inpatient Hospital Stay

## 2024-02-02 DIAGNOSIS — K529 Noninfective gastroenteritis and colitis, unspecified: Secondary | ICD-10-CM | POA: Diagnosis not present

## 2024-02-02 LAB — GLUCOSE, CAPILLARY
Glucose-Capillary: 101 mg/dL — ABNORMAL HIGH (ref 70–99)
Glucose-Capillary: 121 mg/dL — ABNORMAL HIGH (ref 70–99)
Glucose-Capillary: 92 mg/dL (ref 70–99)
Glucose-Capillary: 100 mg/dL — ABNORMAL HIGH (ref 70–99)

## 2024-02-02 LAB — CBC
HCT: 26.1 % — ABNORMAL LOW (ref 36.0–46.0)
Hemoglobin: 8.8 g/dL — ABNORMAL LOW (ref 12.0–15.0)
MCH: 30.9 pg (ref 26.0–34.0)
MCHC: 33.7 g/dL (ref 30.0–36.0)
MCV: 91.6 fL (ref 80.0–100.0)
Platelets: 490 K/uL — ABNORMAL HIGH (ref 150–400)
RBC: 2.85 MIL/uL — ABNORMAL LOW (ref 3.87–5.11)
RDW: 15.7 % — ABNORMAL HIGH (ref 11.5–15.5)
WBC: 13.9 K/uL — ABNORMAL HIGH (ref 4.0–10.5)
nRBC: 0.3 % — ABNORMAL HIGH (ref 0.0–0.2)

## 2024-02-02 LAB — PHOSPHORUS: Phosphorus: 3.1 mg/dL (ref 2.5–4.6)

## 2024-02-02 LAB — BASIC METABOLIC PANEL WITH GFR
Anion gap: 12 (ref 5–15)
BUN: 9 mg/dL (ref 6–20)
CO2: 19 mmol/L — ABNORMAL LOW (ref 22–32)
Calcium: 8 mg/dL — ABNORMAL LOW (ref 8.9–10.3)
Chloride: 108 mmol/L (ref 98–111)
Creatinine, Ser: 0.69 mg/dL (ref 0.44–1.00)
GFR, Estimated: >60 mL/min (ref >60)
Glucose, Bld: 134 mg/dL — ABNORMAL HIGH (ref 70–99)
Potassium: 3.8 mmol/L (ref 3.5–5.1)
Sodium: 139 mmol/L (ref 135–145)

## 2024-02-02 LAB — MAGNESIUM: Magnesium: 2 mg/dL (ref 1.7–2.4)

## 2024-02-02 MED ORDER — IOHEXOL 300 MG/ML  SOLN
100.0000 mL | Freq: Once | INTRAMUSCULAR | Status: AC | PRN
  Administered 2024-02-02: 100 mL via INTRAVENOUS

## 2024-02-02 MED ORDER — VANCOMYCIN HCL 1250 MG/250ML IV SOLN
1250.0000 mg | INTRAVENOUS | Status: DC
  Administered 2024-02-03 – 2024-02-05 (×3): 1250 mg via INTRAVENOUS
  Filled 2024-02-02 (×4): qty 250

## 2024-02-02 MED ORDER — SODIUM BICARBONATE 650 MG PO TABS
650.0000 mg | ORAL_TABLET | Freq: Three times a day (TID) | ORAL | Status: AC
  Administered 2024-02-02 – 2024-02-04 (×6): 650 mg via ORAL
  Filled 2024-02-02 (×6): qty 1

## 2024-02-02 MED ORDER — SODIUM CHLORIDE 0.9 % IV SOLN
2.0000 g | Freq: Three times a day (TID) | INTRAVENOUS | Status: DC
  Administered 2024-02-02 – 2024-02-03 (×3): 2 g via INTRAVENOUS
  Filled 2024-02-02 (×4): qty 12.5

## 2024-02-02 MED ORDER — VANCOMYCIN HCL 2000 MG/400ML IV SOLN
2000.0000 mg | Freq: Once | INTRAVENOUS | Status: AC
  Administered 2024-02-02: 2000 mg via INTRAVENOUS
  Filled 2024-02-02: qty 400

## 2024-02-02 NOTE — Progress Notes (Signed)
 Pharmacy Antibiotic Note  Diana Quinn is a 58 y.o. female w/ PMH of HTN, HLD, DM, CAD, hypothyroidism, depression with anxiety, bipolar, schizophrenia, CKD stage II, A-fib not on anticoagulants, seizure admitted on 01/25/2024 with colitis now being treated for cellulitis.  Pharmacy has been consulted for vancomycin  and cefepime  dosing. Renal function is stable  Plan:  1) start vancomycin  2000 mg IV x 1 then 1250 mg IV every 24 hours --Goal AUC 400-550. --Expected AUC: 461.0 --SCr used: 0.80 mg/dL (rounded up) --levels prior to 3rd/4th maintenance dose if remains on IV vancomycin   2) start cefepime  2 grams IV every 8 hours ---follow renal function for needed dose adjustments   Height: 5' 3 (160 cm) Weight: 92.9 kg (204 lb 12.9 oz) IBW/kg (Calculated) : 52.4  Temp (24hrs), Avg:98.2 F (36.8 C), Min:97.9 F (36.6 C), Max:98.6 F (37 C)  Recent Labs  Lab 01/29/24 0440 01/30/24 0435 01/31/24 0651 02/01/24 0552 02/02/24 1049  WBC 12.4* 15.5* 19.7* 18.1* 13.9*  CREATININE 0.90 0.93 0.75 0.70 0.69    Estimated Creatinine Clearance: 85 mL/min (by C-G formula based on SCr of 0.69 mg/dL).    Allergies  Allergen Reactions   Depakote [Divalproex Sodium] Shortness Of Breath   Pregabalin Other (See Comments), Rash and Shortness Of Breath    Other Reaction: falls; decreased coordination  Pt ts that she passes out when she takes this med.   Other Reaction: falls; decreased coordination    Pt ts that she passes out when she takes this med.  Other Reaction: falls; decreased coordination, , Pt ts that she passes out when she takes this med.   Valproic Acid  Other (See Comments) and Shortness Of Breath    Other Reaction: Other reaction  SOB   Levetiracetam Itching and Rash    Antimicrobials this admission: 11/18 Zosyn  >> 11/23 11/25 vancomycin  >>  11/25 cefepime  >>  Microbiology results: 11/18 BCx: NG 11/19 BCx: NG 11/18 GI panel (-) 11/18 C diff (-) x 3  Thank  you for allowing pharmacy to be a part of this patient's care.  Adriana JONETTA Bolster 02/02/2024 3:16 PM

## 2024-02-02 NOTE — Progress Notes (Signed)
 Progress Note   Patient: Diana Quinn FMW:[id number] DOB: [birth year] DOA: 01/25/2024     7 DOS: the patient was seen and examined on 02/02/2024   Brief hospital course: Partly taken from prior notes.  Diana Quinn is a 58 y.o. female with medical history significant of HTN, HLD, DM, CAD, hypothyroidism, depression with anxiety, bipolar, schizophrenia, CKD stage II, A-fib not on anticoagulants, seizure, who presents with abdominal pain, diarrhea.   On presentation hemodynamically stable, labs with WBC of 17.1, worsening renal function, lactic acid 2.1, lipase 14.   pseudo hyponatremia secondary to hyperglycemia. CT abdomen and pelvis consistent with colitis involving the ascending, transverse and descending colon, without pneumatosis or perforation. Also noted appendiceal wall thickening and a chronic appearing L1 compression fracture.  General surgery was also consulted and they do not think that she has any concern of acute appendicitis and recommending conservative management.  She was started on Zosyn .  Cultures were ordered.  11/18: Vital stable, leukocytosis resolved, potassium of 5.2, CO2 18, lactic acid 2.0, BUN 28, creatinine 1.78, UDS negative, preliminary blood cultures negative in 12 hours. Patient did not had any more diarrhea, C. difficile and GI pathogen ordered but never collected. Discontinuing Dilaudid  as causing a lot of sedation.  PT was also ordered.  Family has more social concern related to her.  Message sent to social worker to visit her tomorrow in the presence of son.  Assessment and Plan:  # Acute colitis - Negative C. difficile and GI pathogen panel -Continue with Zosyn   -Advance diet as tolerated -Discontinue Dilaudid  -Continue with p.o. pain management -Avoid narcotics as possible S/p IV fluid for hydration Started probiotics 11/20 repeat CT a/p: Shows colitis, no new acute finding Fecal calprotectin is elevated 2350 GI consulted, s/p  colonoscopy done on 11/23 Colonoscopy: Preparation of the colon was poor. Multiple ulcers in the proximal transverse colon and at the hepatic flexure. Biopsied.  GI recommended to advance diet and follow-up as an outpatient for biopsy report.  Laxatives to prevent constipation 11/24 still patient has significant abdominal pain, receiving IV pain medications.  Not stable to discharge. X-ray KUB: Possible colitis, did not report perforation  # Abdominal wall cellulitis 11/25 c/p pain and tenderness, noticed some swelling and erythema, tenderness at right lower quadrant CT scan showed cellulitis and fat stranding in the right lower quadrant, no fluid collection or abscess -Pharmacy consulted for antibiotics vancomycin  and cefepime    # Leukocytosis, could be due to cellulitis as above. Initially suspected leukocytosis may be reactive as procalcitonin was negative. Trend WBC count   # AKI (acute kidney injury) Creatinine 1.78> 1.53>1.04 Baseline sCr around 1.  Likely secondary to dehydration with GI losses. - Continue IV fluid -Monitor renal function -Avoid nephrotoxins 11/21 mild metabolic acidosis, CO2 17, bicarbonate IV 100 mEq x 1 dose IV push given. 11/25 bicarb 19, again low, continued oral bicarbonate. Check chemistry daily  # Urinary retention US  renal: No acute renal abnormality identified to explain acute kidney injury. Bladder scan showed 575 mL 11/19 s/p Foley catheter.  Patient accidentally pulled out.  Currently voiding well.  Atrial fibrillation, chronic (HCC) Mild tachycardia.  Not  on any anticoagulation. - Continue to monitor  CAD (coronary artery disease) Patient has a history of CAD.  No chest pain. -Continue home Lipitor, Ranexa   Seizure (HCC) -Continue home Topamax  and Trileptal  11/20 held gabapentin  as per Neuro -Seizure precautions 11/20 patient is having waxing and waning altered mental status with generalized body twitching. EEG: No  seizures,  encephalopathy Neurology consulted to review medications for possibility of polypharmacy Neuro recommended to hold off gabapentin  for now in view of renal failure and restarted lower dose.   Hypothyroidism - Continue home Synthroid   H/o Essential hypertension, BP remained low Not on any medications at home. - Continue to monitor  Hyperkalemia: Resolved Potassium of 5.2>> 4.3 - s/p IV fluid and Lokelma  -Continue to monitor  # Hypophosphatemia, Phos repleted Monitor electrolytes and replete as needed   Type II diabetes mellitus (HCC) CBG within goal. - SSI  HLD (hyperlipidemia) - Continue Lipitor  Depression with anxiety History of bipolar disorder and Schizophrenia. - Continue home medications which include Paxil , as needed clonazepam , trazodone , Seroquel  and Caplyta . 11/20 discontinued trazodone  and Seroquel  as patient was very sleepy and dozing off, having significant twitching of whole body. Psych consulted to review medications and reduce polypharmacy Psych recommended to continue to hold off trazodone  and Seroquel  for now and resume same dose on discharge as it was helping patient.  # Vitamin D  insufficiency, vitamin D  level 22, started vitamin D  50000 units p.o. weekly  # History of vitamin B12 deficiency on B12 monthly injection. B12 level 370, goal 400, started B12 injection during hospital stay for   Subjective: No significant event overnight.  Patient is still complaining of abdominal pain in the right lower quadrant, and feels tenderness. On exam it looks like a cellulitis, started antibiotics.     Physical Exam: Vitals:   02/01/24 2159 02/02/24 0517 02/02/24 0756 02/02/24 1603  BP: 126/84 (!) 124/58 (!) 110/59 (!) 110/59  Pulse: 74 84 77 70  Resp: 16 16 16 16   Temp: 98.1 F (36.7 C) 98.6 F (37 C) 97.9 F (36.6 C) 98.2 F (36.8 C)  TempSrc:  Oral    SpO2: 98% 100% 100% 94%  Weight:      Height:       General.  NAD, lying comfortably Pulmonary.   Lungs clear bilaterally, normal respiratory effort. CV.  Regular rate and rhythm, no JVD, rub or murmur. Abdomen.  Soft, BS positive, RLQ edema and tenderness, due to cellulitis CNS: Awake and alert. No focal neurologic deficit.  Extremities.  No edema  Data Reviewed: Prior data reviewed  Family Communication: Discussed with patient  Disposition: Status is: Inpatient Remains inpatient appropriate because: Severity of illness  Planned Discharge Destination: Home  DVT prophylaxis.  Subcu heparin   Time spent: 40 minutes  This record has been created using Conservation officer, historic buildings. Errors have been sought and corrected,but may not always be located. Such creation errors do not reflect on the standard of care.    Author: Elvan Sor, MD 02/02/2024 4:33 PM  For on call review [email or web address].

## 2024-02-02 NOTE — Progress Notes (Signed)
 PT Cancellation Note  Patient Details Name: Diana Quinn MRN: [id number] DOB: [birth year]   Cancelled Treatment:     PT attempt. Pt currently off floor for CT of abdomen. Acute PT will continue to follow and progress per current POC. Will return at a later time when available to participate.    Rankin KATHEE Essex 02/02/2024, 1:38 PM

## 2024-02-03 DIAGNOSIS — K529 Noninfective gastroenteritis and colitis, unspecified: Secondary | ICD-10-CM | POA: Diagnosis not present

## 2024-02-03 LAB — CBC
HCT: 26.6 % — ABNORMAL LOW (ref 36.0–46.0)
Hemoglobin: 9 g/dL — ABNORMAL LOW (ref 12.0–15.0)
MCH: 31.3 pg (ref 26.0–34.0)
MCHC: 33.8 g/dL (ref 30.0–36.0)
MCV: 92.4 fL (ref 80.0–100.0)
Platelets: 548 K/uL — ABNORMAL HIGH (ref 150–400)
RBC: 2.88 MIL/uL — ABNORMAL LOW (ref 3.87–5.11)
RDW: 15.8 % — ABNORMAL HIGH (ref 11.5–15.5)
WBC: 19.1 K/uL — ABNORMAL HIGH (ref 4.0–10.5)
nRBC: 0.2 % (ref 0.0–0.2)

## 2024-02-03 LAB — GLUCOSE, CAPILLARY
Glucose-Capillary: 145 mg/dL — ABNORMAL HIGH (ref 70–99)
Glucose-Capillary: 109 mg/dL — ABNORMAL HIGH (ref 70–99)
Glucose-Capillary: 88 mg/dL (ref 70–99)
Glucose-Capillary: 148 mg/dL — ABNORMAL HIGH (ref 70–99)

## 2024-02-03 LAB — PHOSPHORUS: Phosphorus: 3.2 mg/dL (ref 2.5–4.6)

## 2024-02-03 LAB — BASIC METABOLIC PANEL WITH GFR
Anion gap: 10 (ref 5–15)
BUN: 9 mg/dL (ref 6–20)
CO2: 19 mmol/L — ABNORMAL LOW (ref 22–32)
Calcium: 8.2 mg/dL — ABNORMAL LOW (ref 8.9–10.3)
Chloride: 110 mmol/L (ref 98–111)
Creatinine, Ser: 0.65 mg/dL (ref 0.44–1.00)
GFR, Estimated: >60 mL/min (ref >60)
Glucose, Bld: 128 mg/dL — ABNORMAL HIGH (ref 70–99)
Potassium: 4.2 mmol/L (ref 3.5–5.1)
Sodium: 138 mmol/L (ref 135–145)

## 2024-02-03 LAB — MAGNESIUM: Magnesium: 2.1 mg/dL (ref 1.7–2.4)

## 2024-02-03 LAB — SURGICAL PATHOLOGY

## 2024-02-03 MED ORDER — HYDROMORPHONE HCL 2 MG PO TABS
1.0000 mg | ORAL_TABLET | Freq: Four times a day (QID) | ORAL | Status: DC | PRN
Start: 2024-02-03 — End: 2024-02-05
  Administered 2024-02-03 – 2024-02-05 (×5): 1 mg via ORAL
  Filled 2024-02-03 (×5): qty 1

## 2024-02-03 MED ORDER — PIPERACILLIN-TAZOBACTAM 3.375 G IVPB
3.3750 g | Freq: Three times a day (TID) | INTRAVENOUS | Status: DC
  Administered 2024-02-03 – 2024-02-05 (×7): 3.375 g via INTRAVENOUS
  Filled 2024-02-03 (×7): qty 50

## 2024-02-03 MED ORDER — HYDROMORPHONE HCL 1 MG/ML IJ SOLN
1.0000 mg | INTRAMUSCULAR | Status: DC | PRN
  Administered 2024-02-03 – 2024-02-04 (×3): 1 mg via INTRAVENOUS
  Filled 2024-02-03 (×4): qty 1

## 2024-02-03 NOTE — Progress Notes (Addendum)
 Progress Note   Patient: Diana Quinn FMW:[id number] DOB: 11-02-1965 DOA: 01/25/2024     8 DOS: the patient was seen and examined on 02/03/2024   Brief hospital course: Diana Quinn is a 58 y.o. female with medical history significant of HTN, HLD, DM, CAD, hypothyroidism, depression with anxiety, bipolar, schizophrenia, CKD stage II, A-fib not on anticoagulants, seizure, who presents with abdominal pain, diarrhea.  Patient was found to have acute colitis, s/p IV Zosyn  and also underwent colonoscopy on 11/23.  Patient continued to have abdominal pain consistent with cellulitis, on antibiotics Hospital course as below  Assessment and Plan:  Acute colitis - Negative C. difficile and GI pathogen panel - s/p Zosyn , IV fluids - Fecal calprotectin is elevated 2350 - Seen by general surgery for concern for appendicitis, no intervention required - GI consulted, s/p colonoscopy on 11/23 - Colonoscopy: Preparation of the colon was poor. Multiple ulcers in the proximal transverse colon and at the hepatic flexure. Biopsied.  GI recommended to advance diet and follow-up as an outpatient for biopsy report.  Laxatives to prevent constipation - Decrease Dilaudid  dose as patient very drowsy, taper off narcotics as able  Abdominal wall cellulitis - 11/25 c/p pain and tenderness, noticed some swelling and erythema, tenderness at right lower quadrant - Leukocytosis increased - Repeat CT 11/25 stable findings of inflammatory/infectious colitis. showed cellulitis and fat stranding in the right lower quadrant, no fluid collection or abscess - On IV Vancomycin , change Cefepime  to Zosyn   Thrombocytosis - Likely reactive  AKI - resolved - Likely secondary to dehydration with GI losses. - S/p IV fluids. Monitor Cr  Urinary retention - resolved US  renal: No acute renal abnormality identified to explain acute kidney injury. Bladder scan showed 575 mL 11/19 s/p Foley catheter.  Patient accidentally  pulled out.  Currently voiding well  Atrial fibrillation, chronic - Not  on any anticoagulation - Continue to monitor  CAD (coronary artery disease) Patient has a history of CAD.  No chest pain. - Continue home Lipitor, Ranexa   Seizure (HCC) - Continue home Topamax  and Trileptal  11/20 held gabapentin  as per Neuro 11/20 patient is having waxing and waning altered mental status with generalized body twitching. EEG: No seizures, encephalopathy Neurology consulted to review medications for possibility of polypharmacy Neuro recommended to hold off gabapentin  for now in view of renal failure and restarted lower dose.  Hypothyroidism - Continue home Synthroid   H/o Essential hypertension, BP remained low Not on any medications at home. - Continue to monitor  Hyperkalemia: Resolved - s/p IV fluid and Lokelma  -Continue to monitor  Hypophosphatemia, Phos repleted Monitor electrolytes and replete as needed  Type II diabetes mellitus (HCC) CBG within goal. - SSI  HLD (hyperlipidemia) - Continue Lipitor  Depression with anxiety History of bipolar disorder and Schizophrenia. - Continue home medications which include Paxil , as needed clonazepam , trazodone , Seroquel  and Caplyta . 11/20 discontinued trazodone  and Seroquel  as patient was very sleepy and dozing off, having significant twitching of whole body. Psych consulted to review medications and reduce polypharmacy Psych recommended to continue to hold off trazodone  and Seroquel  for now and resume same dose on discharge as it was helping patient.  Vitamin D  insufficiency, vitamin D  level 22, started vitamin D  50000 units p.o. weekly  History of vitamin B12 deficiency on B12 monthly injection. B12 level 370, goal 400, started B12 injection during hospital stay for 7 days  chronic appearing L1 compression fracture   Subjective: Patient intermittently complaining of RLQ pain and requesting IV  Dilaudid  On my exam found to be  drowsy, moving all extremities and intermittently answering Later in the day more awake, RN reported Will decrease the dose of opioid   Physical Exam: Vitals:   02/02/24 0756 02/02/24 1603 02/02/24 1959 02/03/24 0322  BP: (!) 110/59 (!) 110/59 117/63 (!) 103/59  Pulse: 77 70 88 77  Resp: 16 16 20 20   Temp: 97.9 F (36.6 C) 98.2 F (36.8 C) 98.5 F (36.9 C) 98.6 F (37 C)  TempSrc:   Oral Oral  SpO2: 100% 94% 100% 95%  Weight:      Height:       General.  NAD, lying comfortably Pulmonary.  Lungs clear bilaterally, normal respiratory effort. CV.  Regular rate and rhythm, no JVD, rub or murmur. Abdomen.  Soft, BS positive, RLQ edema and tenderness, due to cellulitis CNS: Drowsy, No focal neurologic deficit.  Extremities.  No edema  Data Reviewed: Prior data reviewed  Family Communication: Discussed with patient  Disposition: Status is: Inpatient Remains inpatient appropriate because: Severity of illness  Planned Discharge Destination: Home  DVT prophylaxis.  Subcu heparin   Time spent: 50 minutes  This record has been created using Conservation officer, historic buildings. Errors have been sought and corrected,but may not always be located. Such creation errors do not reflect on the standard of care.    Author: Laree Lock, MD 02/03/2024 8:15 AM  For on call review [email or web address].

## 2024-02-03 NOTE — Progress Notes (Signed)
 Physical Therapy Treatment Patient Details Name: Diana Quinn MRN: [id number] DOB: 1965-06-04 Today's Date: 02/03/2024   History of Present Illness Pt is a 58 y/o F presenting to ED with c/o abdominal pain and diarrhea. Workup for acute colitis. PMH significant for HTN, HLD, DM, CAD, hypothyroidism, depression with anxiety, bipolar disorder, schizophrenia, CKD stage II, afib, seizures.    PT Comments  Pt received in bed and was reporting steady 8.5/10 abdominal pain at rest and with movement. With persistence, PT agreeable to get into the recliner to finish her breakfast. Pt also reports that nursing is switching her to IV dilaudid  because PO has not been helping. Pt able to perform supine to sit EOB with CGA and increased time to complete, no vc needed. Pt perform STS and step-pivot transfer to recliner with CGA and no AD. She was set up in recliner when nursing came in to start IV. Pt displays capability to return to PLOF once her abdominal pain subsides. She will continue to benefit from skilled PT services to address her functional mobility and strength related to her hospital stay.    If plan is discharge home, recommend the following: A little help with walking and/or transfers   Can travel by private vehicle     Yes  Equipment Recommendations  None recommended by PT    Recommendations for Other Services       Precautions / Restrictions Precautions Precautions: Fall Recall of Precautions/Restrictions: Intact Restrictions Weight Bearing Restrictions Per Provider Order: No     Mobility  Bed Mobility Overal bed mobility: Needs Assistance Bed Mobility: Supine to Sit     Supine to sit: Contact guard     General bed mobility comments: HOB elevated; no vc or physical assist, increased time    Transfers Overall transfer level: Needs assistance Equipment used: 1 person hand held assist Transfers: Sit to/from Stand Sit to Stand: Contact guard assist            General transfer comment: CGA and increased time for STS from lowest surface, no LOB or concern for dizziness; pain noted with movement    Ambulation/Gait Ambulation/Gait assistance: Contact guard assist Gait Distance (Feet): 2 Feet Assistive device: 1 person hand held assist Gait Pattern/deviations: Step-to pattern, Trunk flexed       General Gait Details: amb 2 steps for step pivot into recliner; amb very limited d/t reported pain levels; CGA for the amb she did complete and no LOB noted   Stairs             Wheelchair Mobility     Tilt Bed    Modified Rankin (Stroke Patients Only)       Balance Overall balance assessment: Needs assistance Sitting-balance support: Feet supported Sitting balance-Leahy Scale: Good Sitting balance - Comments: balance EOB wihtout UE support   Standing balance support: Bilateral upper extremity supported, During functional activity Standing balance-Leahy Scale: Fair Standing balance comment: L hand to bedrail and R HHA to perform SPT to recliner; reliant on external support for balance                            Communication Communication Communication: No apparent difficulties  Cognition Arousal: Alert Behavior During Therapy: WFL for tasks assessed/performed   PT - Cognitive impairments: No apparent impairments                       PT - Cognition Comments: A&Ox4,  pleasant and cooperative Following commands: Intact      Cueing Cueing Techniques: Verbal cues  Exercises Other Exercises Other Exercises: edu on importance of mobility in acute setting    General Comments General comments (skin integrity, edema, etc.): pain 8.5/10 in abdomen on arrival and exit      Pertinent Vitals/Pain Pain Assessment Pain Assessment: 0-10 Pain Score: 8  Pain Location: Abdomen, with movement Pain Descriptors / Indicators: Grimacing, Guarding, Sharp Pain Intervention(s): RN gave pain meds during session,  Monitored during session, Limited activity within patient's tolerance    Home Living                          Prior Function            PT Goals (current goals can now be found in the care plan section) Acute Rehab PT Goals Patient Stated Goal: to get better PT Goal Formulation: With patient Time For Goal Achievement: 02/10/24 Potential to Achieve Goals: Good Progress towards PT goals: Progressing toward goals    Frequency    Min 2X/week      PT Plan      Co-evaluation              AM-PAC PT 6 Clicks Mobility   Outcome Measure  Help needed turning from your back to your side while in a flat bed without using bedrails?: None Help needed moving from lying on your back to sitting on the side of a flat bed without using bedrails?: None Help needed moving to and from a bed to a chair (including a wheelchair)?: A Little Help needed standing up from a chair using your arms (e.g., wheelchair or bedside chair)?: A Little Help needed to walk in hospital room?: A Little Help needed climbing 3-5 steps with a railing? : A Lot 6 Click Score: 19    End of Session   Activity Tolerance: Patient limited by pain Patient left: in chair;with chair alarm set;with call bell/phone within reach;with nursing/sitter in room Nurse Communication: Mobility status PT Visit Diagnosis: Other abnormalities of gait and mobility (R26.89);Muscle weakness (generalized) (M62.81);Difficulty in walking, not elsewhere classified (R26.2);Pain Pain - part of body:  (abdomen)     Time: 9151-9141 PT Time Calculation (min) (ACUTE ONLY): 10 min  Charges:                            Allena Bulls, SPT    Allena Bulls 02/03/2024, 10:19 AM

## 2024-02-04 DIAGNOSIS — K529 Noninfective gastroenteritis and colitis, unspecified: Secondary | ICD-10-CM | POA: Diagnosis not present

## 2024-02-04 LAB — CBC
HCT: 27.9 % — ABNORMAL LOW (ref 36.0–46.0)
Hemoglobin: 9.2 g/dL — ABNORMAL LOW (ref 12.0–15.0)
MCH: 31 pg (ref 26.0–34.0)
MCHC: 33 g/dL (ref 30.0–36.0)
MCV: 93.9 fL (ref 80.0–100.0)
Platelets: 654 K/uL — ABNORMAL HIGH (ref 150–400)
RBC: 2.97 MIL/uL — ABNORMAL LOW (ref 3.87–5.11)
RDW: 16 % — ABNORMAL HIGH (ref 11.5–15.5)
WBC: 8.4 K/uL (ref 4.0–10.5)
nRBC: 0.2 % (ref 0.0–0.2)

## 2024-02-04 LAB — GLUCOSE, CAPILLARY
Glucose-Capillary: 118 mg/dL — ABNORMAL HIGH (ref 70–99)
Glucose-Capillary: 122 mg/dL — ABNORMAL HIGH (ref 70–99)
Glucose-Capillary: 99 mg/dL (ref 70–99)
Glucose-Capillary: 114 mg/dL — ABNORMAL HIGH (ref 70–99)

## 2024-02-04 LAB — BASIC METABOLIC PANEL WITH GFR
Anion gap: 12 (ref 5–15)
BUN: 8 mg/dL (ref 6–20)
CO2: 21 mmol/L — ABNORMAL LOW (ref 22–32)
Calcium: 8.2 mg/dL — ABNORMAL LOW (ref 8.9–10.3)
Chloride: 108 mmol/L (ref 98–111)
Creatinine, Ser: 0.71 mg/dL (ref 0.44–1.00)
GFR, Estimated: >60 mL/min (ref >60)
Glucose, Bld: 102 mg/dL — ABNORMAL HIGH (ref 70–99)
Potassium: 3.9 mmol/L (ref 3.5–5.1)
Sodium: 141 mmol/L (ref 135–145)

## 2024-02-04 MED ORDER — SODIUM CHLORIDE 0.9% FLUSH: 10.0000 mL | INTRAVENOUS | Status: DC | PRN

## 2024-02-04 MED ORDER — SODIUM CHLORIDE 0.9% FLUSH
10.0000 mL | Freq: Two times a day (BID) | INTRAVENOUS | Status: DC
  Administered 2024-02-04 – 2024-02-05 (×3): 10 mL

## 2024-02-04 NOTE — Plan of Care (Signed)

## 2024-02-04 NOTE — Progress Notes (Signed)
 Progress Note   Patient: Diana Quinn FMW:[id number] DOB: 11-16-1965 DOA: 01/25/2024     9 DOS: the patient was seen and examined on 02/04/2024   Brief hospital course: Diana Quinn is a 58 y.o. female with medical history significant of HTN, HLD, DM, CAD, hypothyroidism, depression with anxiety, bipolar, schizophrenia, CKD stage II, A-fib not on anticoagulants, seizure, who presents with abdominal pain, diarrhea.  Patient was found to have acute colitis, s/p IV Zosyn  and also underwent colonoscopy on 11/23.  Patient continued to have abdominal pain consistent with cellulitis, on antibiotics Hospital course as below  Assessment and Plan:  Acute colitis - Negative C. difficile and GI pathogen panel - s/p Zosyn , IV fluids - Fecal calprotectin is elevated 2350 - Seen by general surgery for concern for appendicitis, no intervention required - GI consulted, s/p colonoscopy on 11/23 - Colonoscopy: Preparation of the colon was poor. Multiple ulcers in the proximal transverse colon and at the hepatic flexure. Biopsied.  GI recommended to advance diet and follow-up as an outpatient for biopsy report.  Laxatives to prevent constipation - Decrease Dilaudid  dose as patient very drowsy, taper off narcotics as able. Discontinued IV opioids  Abdominal wall cellulitis - 11/25 c/p pain and tenderness, noticed some swelling and erythema, tenderness at right lower quadrant - Leukocytosis resolved - Repeat CT 11/25 stable findings of inflammatory/infectious colitis. showed cellulitis and fat stranding in the right lower quadrant, no fluid collection or abscess - On IV Vancomycin , change Cefepime  to Zosyn   Thrombocytosis - Likely reactive  AKI - resolved - Likely secondary to dehydration with GI losses. - S/p IV fluids. Monitor Cr  Urinary retention - resolved US  renal: No acute renal abnormality identified to explain acute kidney injury. Bladder scan showed 575 mL 11/19 s/p Foley  catheter.  Patient accidentally pulled out.  Currently voiding well  Atrial fibrillation, chronic - Not  on any anticoagulation - Continue to monitor  CAD (coronary artery disease) Patient has a history of CAD.  No chest pain. - Continue home Lipitor, Ranexa   Seizure (HCC) - Continue home Topamax  and Trileptal  11/20 held gabapentin  as per Neuro 11/20 patient is having waxing and waning altered mental status with generalized body twitching. EEG: No seizures, encephalopathy Neurology consulted to review medications for possibility of polypharmacy Neuro recommended to hold off gabapentin  for now in view of renal failure and restarted lower dose.  Hypothyroidism - Continue home Synthroid   H/o Essential hypertension, BP remained low Not on any medications at home. - Continue to monitor  Hyperkalemia: Resolved - s/p IV fluid and Lokelma  -Continue to monitor  Hypophosphatemia, Phos repleted Monitor electrolytes and replete as needed  Type II diabetes mellitus (HCC) CBG within goal. - SSI  HLD (hyperlipidemia) - Continue Lipitor  Depression with anxiety History of bipolar disorder and Schizophrenia. - Continue home medications which include Paxil , as needed clonazepam , trazodone , Seroquel  and Caplyta . 11/20 discontinued trazodone  and Seroquel  as patient was very sleepy and dozing off, having significant twitching of whole body. Psych consulted to review medications and reduce polypharmacy Psych recommended to continue to hold off trazodone  and Seroquel  for now and resume same dose on discharge as it was helping patient.  Vitamin D  insufficiency, vitamin D  level 22, started vitamin D  50000 units p.o. weekly  History of vitamin B12 deficiency on B12 monthly injection. B12 level 370, goal 400, started B12 injection during hospital stay for 7 days  chronic appearing L1 compression fracture   Subjective: Patient has been requesting IV Dilaudid   Discussed with patient to  minimize the use of opiate, including p.o. Dilaudid  Leukocytosis has resolved and symptoms have been improving Plan to discharge tomorrow on oral antibiotics and follow-up with GI outpatient   Physical Exam: Vitals:   02/04/24 0057 02/04/24 0412 02/04/24 0752 02/04/24 1452  BP:  111/79 (!) 124/59 117/68  Pulse:  78 81 80  Resp: 18 18 18 16   Temp:  98.5 F (36.9 C) 98.4 F (36.9 C) 97.9 F (36.6 C)  TempSrc:  Oral    SpO2:  99% 99% 99%  Weight:      Height:       General.  NAD, lying comfortably Pulmonary.  Lungs clear bilaterally, normal respiratory effort. CV.  Regular rate and rhythm, no JVD, rub or murmur. Abdomen.  Soft, BS positive, non tender CNS: Awake, alert, No focal neurologic deficit.  Extremities.  No edema  Data Reviewed: Prior data reviewed  Family Communication: Discussed with patient  Disposition: Status is: Inpatient Remains inpatient appropriate because: Severity of illness  Planned Discharge Destination: Home  DVT prophylaxis.  Subcu heparin   Time spent: 35 minutes  This record has been created using Conservation officer, historic buildings. Errors have been sought and corrected,but may not always be located. Such creation errors do not reflect on the standard of care.    Author: Laree Lock, MD 02/04/2024 4:49 PM  For on call review [email or web address].

## 2024-02-04 NOTE — TOC Initial Note (Signed)
 Transition of Care [facility]) - Initial/Assessment Note    Patient Details  Name: Diana Quinn MRN: [id number] Date of Birth: April 07, 1965  Transition [facility]) CM/SW Contact:    Diana JINNY Ruts, LCSW Phone Number: 02/04/2024, 11:51 AM  Clinical Narrative:                 Chart reviewed. I spoke with the patient at bedside today. I introduced myself, my role, and reason for consult. I informed the patient of SNF recommendation. The patient declined. The patient was accepting of [facility] referral. I provided the patient with a list of [facility] agencies. Patient consent to sending referral in the HUB.  Expected Discharge Plan: Home/Self Care Barriers to Discharge: Continued Medical Work up   Patient Goals and CMS Choice   CMS Medicare.gov Compare Post Acute Care list provided to:: Patient        Expected Discharge Plan and Services       Living arrangements for the past 2 months: Single Family Home                                      Prior Living Arrangements/Services Living arrangements for the past 2 months: Single Family Home Lives with:: Self Patient language and need for interpreter reviewed:: Yes Do you feel safe going back to the place where you live?: Yes      Need for Family Participation in Patient Care: No (Comment) Care giver support system in place?: No (comment)   Criminal Activity/Legal Involvement Pertinent to Current Situation/Hospitalization: No - Comment as needed  Activities of Daily Living   ADL Screening (condition at time of admission) Independently performs ADLs?: Yes (appropriate for developmental age) Is the patient deaf or have difficulty hearing?: No Does the patient have difficulty seeing, even when wearing glasses/contacts?: Yes Does the patient have difficulty concentrating, remembering, or making decisions?: No  Permission Sought/Granted                  Emotional Assessment Appearance:: Appears stated  age Attitude/Demeanor/Rapport: Gracious Affect (typically observed): Calm Orientation: : Oriented to Self, Oriented to Place, Oriented to  Time, Oriented to Situation Alcohol / Substance Use: Not Applicable Psych Involvement: No (comment)  Admission diagnosis:  Acute colitis [K52.9] Patient Active Problem List   Diagnosis Date Noted   Acute colitis 01/26/2024   Seizure (HCC) 01/26/2024   HLD (hyperlipidemia) 01/26/2024   CAD (coronary artery disease) 01/26/2024   Hypothyroidism 01/26/2024   Depression with anxiety 01/26/2024   Bipolar disorder (HCC) 01/26/2024   Atrial fibrillation, chronic (HCC) 01/26/2024   Hyperkalemia 01/26/2024   AKI (acute kidney injury) 01/26/2024   Type II diabetes mellitus (HCC) 01/26/2024   Sternal pain 10/03/2022   Constipation 10/03/2022   Type 2 diabetes mellitus with hyperglycemia, without long-term current use of insulin  (HCC) 09/29/2022   Obesity (BMI 30-39.9) 09/25/2022   Major depressive disorder, recurrent episode, moderate (HCC) 09/14/2022   Left ankle sprain 04/14/2019   History of angina 04/10/2019   Acute marginal ulcer 12/16/2018   SVT (supraventricular tachycardia) 11/11/2018   Essential hypertension 11/04/2018   Seizures (HCC) 10/02/2018   H/O gastric bypass 07/27/2018   Iron deficiency anemia 07/27/2018   Spondylosis without myelopathy or radiculopathy, lumbosacral region 04/27/2018   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   Chronic hip pain (Bilateral) 04/14/2018   Chronic sacroiliac joint pain (Bilateral) 04/14/2018   Lumbar facet syndrome (Bilateral) (  L>R) 04/14/2018   Neurogenic pain 04/14/2018   Disorder of sacrum 04/14/2018   Neuralgia, neuritis or radiculitis 04/14/2018   Osteoarthritis of knees (Bilateral) 04/13/2018   Tricompartment osteoarthritis of knee (Right) 04/13/2018   Chronic lower extremity pain (Secondary Area of Pain) (Bilateral) (L>R) 03/31/2018   Chronic neck pain (Tertiary Area of Pain) (Bilateral)  (L>R) 03/31/2018   Pharmacologic therapy 03/31/2018   Problems influencing health status 03/31/2018   Chronic knee pain (Bilateral) (L>R) 03/31/2018   Chronic pain syndrome 03/31/2018   DDD (degenerative disc disease), lumbar 02/08/2018   Mixed hyperlipidemia 02/08/2018   Lumbar spondylosis 02/08/2018   Vitamin D  deficiency 02/08/2018   Angina pectoris 01/26/2018   Hypotension 01/26/2018   Impaired glucose tolerance 01/26/2018   B12 deficiency due to diet 01/26/2018   Schizophrenia (HCC) 01/26/2018   Hypoglycemia after GI (gastrointestinal) surgery 02/23/2017   Unsteady gait 07/04/2016   Uncomplicated opioid dependence (HCC) 12/31/2014   Lumbar pain 02/23/2014   Lumbar radiculopathy 02/23/2014   Recurrent abdominal pain 06/06/2012   Seizure disorder (HCC) 07/10/2011   Status post bariatric surgery 03/24/2011   Generalized anxiety disorder 03/24/2011   Acquired hypothyroidism 03/24/2011   Migraine, unspecified, not intractable, without status migrainosus 03/24/2011   Sleep disturbances 03/24/2011   Bipolar 1 disorder, mixed, moderate (HCC) 03/24/2011   PCP:  Orlean Alan HERO, FNP Pharmacy:   [facility] [address] [facility] - [place], [place] - [address] AT [facility] [address] [place] [address] Phone: [phone] Fax: [phone]  [facility] [address] [place] [address] Phone: [phone] Fax: [phone]  [id number] at [facility], [place] - [address] AT [address] [place] [address] Phone: [phone] Fax: [phone]  [facility] - [address], [place] - [address] [place] [address] Phone: [phone] Fax: [phone]     Social Drivers of Health (SDOH) Social History: SDOH Screenings   Food Insecurity: No Food Insecurity (01/26/2024)  Housing: High Risk (01/26/2024)  Transportation Needs: No Transportation  Needs (01/26/2024)  Utilities: Not At Risk (01/26/2024)  Depression (PHQ2-9): Low Risk  (10/26/2018)  Social Connections: Socially Isolated (06/13/2023)  Tobacco Use: Low Risk  (01/26/2024)   SDOH Interventions:     Readmission Risk Interventions     No data to display

## 2024-02-04 NOTE — Progress Notes (Signed)
 A midline has been ordered for this patient. The [facility]AST RN has reviewed the patient's medical record including any arm restrictions, current creatinine clearance, length IV therapy is needed, and infusions needed/ordered to determine if a midline is the appropriate line for this individual patient. If there are contraindications, the physician and primary RN have been contacted by [facility] RN for further discussion. Midline Education: a midline is a long peripheral IV placed in the upper arm with the tip located at or near the axilla and distal to the shoulder Should not be used as a CLABSI preventative measure   has one lumen only can remain in place for up to 29 days  Safe for Vancomycin  infusion LESS THAN 6 days Safe for power injection if good blood return can be obtained and line flushes easily CANNOT be used for continuous infusion of vesicants. Including TPN and chemotherapy Should NOT be placed for sole intent of obtaining labs as there is no guarantee blood can be successfully drawn from line  contraindicated in patients with thrombosis, hypercoagulability, decreased venous flow to the extremities, ESRD (without a nephrologist's approval), small vessels, allergy to polyurethane, or known/suspected presence of a device-related infection, bacteremia, or septicemia

## 2024-02-05 ENCOUNTER — Encounter: Payer: Self-pay | Admitting: Oncology

## 2024-02-05 ENCOUNTER — Other Ambulatory Visit: Payer: Self-pay

## 2024-02-05 DIAGNOSIS — K529 Noninfective gastroenteritis and colitis, unspecified: Secondary | ICD-10-CM | POA: Diagnosis not present

## 2024-02-05 LAB — BASIC METABOLIC PANEL WITH GFR
Anion gap: 11 (ref 5–15)
BUN: 7 mg/dL (ref 6–20)
CO2: 18 mmol/L — ABNORMAL LOW (ref 22–32)
Calcium: 8.1 mg/dL — ABNORMAL LOW (ref 8.9–10.3)
Chloride: 109 mmol/L (ref 98–111)
Creatinine, Ser: 0.64 mg/dL (ref 0.44–1.00)
GFR, Estimated: >60 mL/min (ref >60)
Glucose, Bld: 105 mg/dL — ABNORMAL HIGH (ref 70–99)
Potassium: 5.2 mmol/L — ABNORMAL HIGH (ref 3.5–5.1)
Sodium: 138 mmol/L (ref 135–145)

## 2024-02-05 LAB — GLUCOSE, CAPILLARY
Glucose-Capillary: 103 mg/dL — ABNORMAL HIGH (ref 70–99)
Glucose-Capillary: 89 mg/dL (ref 70–99)

## 2024-02-05 MED ORDER — ENOXAPARIN SODIUM 60 MG/0.6ML IJ SOSY: 45.0000 mg | PREFILLED_SYRINGE | Freq: Every day | INTRAMUSCULAR | Status: DC

## 2024-02-05 MED ORDER — GABAPENTIN 600 MG PO TABS: 600.0000 mg | ORAL_TABLET | Freq: Two times a day (BID) | ORAL | Status: AC

## 2024-02-05 MED ORDER — HYDROMORPHONE HCL 2 MG PO TABS
1.0000 mg | ORAL_TABLET | Freq: Four times a day (QID) | ORAL | 0 refills | Status: AC | PRN
  Filled 2024-02-05: qty 5, 3d supply, fill #0

## 2024-02-05 MED ORDER — SODIUM ZIRCONIUM CYCLOSILICATE 10 G PO PACK
10.0000 g | PACK | Freq: Once | ORAL | Status: AC
  Administered 2024-02-05: 10 g via ORAL
  Filled 2024-02-05: qty 1

## 2024-02-05 MED ORDER — POLYETHYLENE GLYCOL 3350 17 GM/SCOOP PO POWD
17.0000 g | Freq: Every day | ORAL | 0 refills | Status: AC | PRN
  Filled 2024-02-05: qty 238, 14d supply, fill #0

## 2024-02-05 NOTE — TOC Transition Note (Signed)
 Transition of Care [facility]) - Discharge Note   Patient Details  Name: Diana Quinn MRN: [id number] Date of Birth: 1965/11/19  Transition of Care [facility]) CM/SW Contact:  Corean ONEIDA Haddock, RN Phone Number: 02/05/2024, 2:54 PM   Clinical Narrative:     Patient to discharge today No home health agencies have accepted  due insurance.  Discussed option of outpatient therapy.  Patient declines and states that she does not feel like this is something that is indicated.   Patient states that daughter in law Rolin will be transporting at discharge.  She request that I call Rolin to notify her of the above information call placed to [phone]     Barriers to Discharge: Continued Medical Work up   Patient Goals and CMS Choice   CMS Medicare.gov Compare Post Acute Care list provided to:: Patient        Discharge Placement                       Discharge Plan and Services Additional resources added to the After Visit Summary for                                       Social Drivers of Health (SDOH) Interventions SDOH Screenings   Food Insecurity: No Food Insecurity (01/26/2024)  Housing: High Risk (01/26/2024)  Transportation Needs: No Transportation Needs (01/26/2024)  Utilities: Not At Risk (01/26/2024)  Depression (PHQ2-9): Low Risk  (10/26/2018)  Social Connections: Socially Isolated (06/13/2023)  Tobacco Use: Low Risk  (01/26/2024)     Readmission Risk Interventions     No data to display

## 2024-02-05 NOTE — Progress Notes (Signed)
 Midline removed, tip intact. Patient dressed. Discharge instructions reviewed. Patient wheeled to the medical mall exit to be discharged to the care of family in stable condition.  Diana Quinn V Emmaus Brandi

## 2024-02-05 NOTE — Plan of Care (Signed)
  Problem: Education: Goal: Ability to describe self-care measures that may prevent or decrease complications (Diabetes Survival Skills Education) will improve Outcome: Adequate for Discharge Goal: Individualized Educational Video(s) Outcome: Adequate for Discharge   

## 2024-02-05 NOTE — Discharge Summary (Addendum)
 Physician Discharge Summary   Patient: Diana Quinn MRN: [id number] DOB: December 22, 1965  Admit date:     01/25/2024  Discharge date: 02/05/24  Discharge Physician: Laree Lock   PCP: Orlean Alan HERO, FNP   Recommendations at discharge:  Follow-up with PCP within 3 days -repeat BMP (potassium)  Follow-up with gastroenterology outpatient Follow-up with psychiatry outpatient  Discharge Diagnoses: Principal Problem:   Acute colitis Active Problems:   Atrial fibrillation, chronic (HCC)   AKI (acute kidney injury)   CAD (coronary artery disease)   Seizure (HCC)   Hypothyroidism   Essential hypertension   Hyperkalemia   HLD (hyperlipidemia)   Type II diabetes mellitus (HCC)   Depression with anxiety   Bipolar disorder (HCC)   Schizophrenia [facility])   Hospital Course: Diana Quinn is a 58 y.o. female with medical history significant of HTN, HLD, DM, CAD, hypothyroidism, depression with anxiety, bipolar, schizophrenia, CKD stage II, A-fib not on anticoagulants, seizure, who presents with abdominal pain, diarrhea.  Patient was found to have acute colitis, s/p IV Zosyn  and also underwent colonoscopy on 11/23.  Patient continued to have abdominal pain consistent with cellulitis, completed antibiotics Hospital course as below  Acute colitis - Negative C. difficile and GI pathogen panel - s/p Zosyn , IV fluids - Fecal calprotectin is elevated 2350 - Seen by general surgery for concern for appendicitis, no intervention required - GI consulted, s/p colonoscopy on 11/23 - Colonoscopy: Preparation of the colon was poor. Multiple ulcers in the proximal transverse colon and at the hepatic flexure. Biopsied.  GI recommended to advance diet and follow-up as an outpatient for biopsy report.  Laxatives to prevent constipation - Tylenol , low dose po Dilaudid  as needed   Abdominal wall cellulitis - resolved - Leukocytosis resolved - Repeat CT 11/25 stable findings of  inflammatory/infectious colitis. showed cellulitis and fat stranding in the right lower quadrant, no fluid collection or abscess - completed abx x 10 days   Thrombocytosis - Likely reactive, follow up outpatient   AKI - resolved - Likely secondary to dehydration with GI losses. - S/p IV fluids   Urinary retention - resolved US  renal: No acute renal abnormality identified to explain acute kidney injury. Bladder scan showed 575 mL 11/19 s/p Foley catheter.  Patient accidentally pulled out.  Currently voiding well   Atrial fibrillation, chronic - Not  on any anticoagulation   Hx CAD (coronary artery disease) - Continue home Lipitor, Ranexa   Acute toxic/metabolic encephalopathy - resolved - suspect multifactorial due to colitis, polypharmacy - Gabapentin  dose decreased, Trazodone  and Seroquel  held   Seizure (HCC) - Continue home Topamax  and Trileptal  - Seen by neurology, decreased dose of gabapentin  EEG: No seizures, encephalopathy   Hypothyroidism - Continue home Synthroid    H/o Essential hypertension,   -Controlled, hold Minipress   Mild Hyperkalemia - s/p Lokelma  - Follow-up outpatient, patient insisted on going home today   Hypophosphatemia, Phos repleted   Type II diabetes mellitus   HLD (hyperlipidemia) - Continue Lipitor   Depression with anxiety History of bipolar disorder and Schizophrenia. - Continue Paxil , as needed clonazepam , Trileptal , Caplyta . - Seen by psychiatry, recommend holding trazodone  and seroquel    Vitamin D  insufficiency -on weekly 50K units   History of vitamin B12 deficiency on B12 monthly injection.   chronic appearing L1 compression fracture  Obesity Class II - Weight loss and lifestyle modification  -- Home PT could not be arranged due to insurance, refused outpatient PT   Pain control - [place]  Controlled Substance Reporting  System database was reviewed. and patient was instructed, not to drive, operate heavy machinery,  perform activities at heights, swimming or participation in water activities or provide baby-sitting services while on Pain, Sleep and Anxiety Medications; until their outpatient Physician has advised to do so again. Also recommended to not to take more than prescribed Pain, Sleep and Anxiety Medications.  Consultants: Neurology, gastroenterology, psychiatry Disposition: Home health Diet recommendation:  Discharge Diet Orders (From admission, onward)     Start     Ordered   02/05/24 0000  Diet - low sodium heart healthy        02/05/24 1420           DISCHARGE MEDICATION: Allergies as of 02/05/2024       Reactions   Depakote [divalproex Sodium] Shortness Of Breath   Pregabalin Other (See Comments), Rash, Shortness Of Breath   Other Reaction: falls; decreased coordination Pt ts that she passes out when she takes this med.  Other Reaction: falls; decreased coordination    Pt ts that she passes out when she takes this med. Other Reaction: falls; decreased coordination, , Pt ts that she passes out when she takes this med.   Valproic Acid  Other (See Comments), Shortness Of Breath   Other Reaction: Other reaction SOB   Levetiracetam Itching, Rash        Medication List     STOP taking these medications    prazosin  2 MG capsule Commonly known as: MINIPRESS    QUEtiapine  100 MG tablet Commonly known as: SEROQUEL    tiZANidine  4 MG tablet Commonly known as: ZANAFLEX    traZODone  150 MG tablet Commonly known as: DESYREL        TAKE these medications    Accu-Chek Guide Test test strip Generic drug: glucose blood TEST BLOOD SUGAR UP TO THREE TIMES DAILY BEFORE MEALS *NEW PRESCRIPTION REQUEST*   acetaminophen  325 MG tablet Commonly known as: TYLENOL  Take 2 tablets (650 mg total) by mouth every 6 (six) hours as needed for mild pain or moderate pain.   Alcohol Prep 70 % Pads USE WITH INSULIN  THREE TIMES A DAY AS NEEDED *NEW PRESCRIPTION REQUEST*   atorvastatin  40 MG  tablet Commonly known as: LIPITOR Take 1 tablet (40 mg total) by mouth daily.   Belsomra  20 MG Tabs Generic drug: Suvorexant  Take 1 tablet by mouth at bedtime as needed.   butalbital -apap-caffeine -codeine 50-325-40-30 MG capsule Commonly known as: FIORICET  WITH CODEINE Take 1 capsule by mouth every 6 (six) hours as needed for headache.   Caplyta  42 MG capsule Generic drug: lumateperone  tosylate Take 1 capsule (42 mg total) by mouth daily.   cetirizine  10 MG tablet Commonly known as: ZyrTEC  Allergy Take 1 tablet (10 mg total) by mouth daily.   clonazePAM  1 MG tablet Commonly known as: KLONOPIN  Take 1 mg by mouth at bedtime as needed.   cyanocobalamin  1000 MCG/ML injection Commonly known as: VITAMIN B12 Inject 1 mL (1,000 mcg total) into the muscle every 30 (thirty) days.   Diclofenac  Sodium 3 % Gel Apply 1 Application topically in the morning and at bedtime.   Embecta Pen Needle Nano 2 Gen 32G X 4 MM Misc Generic drug: Insulin  Pen Needle USE WITH INSULIN  THREE TIMES A DAY AS NEEDED *NEW PRESCRIPTION REQUEST*   Emgality  120 MG/ML Soaj Generic drug: Galcanezumab -gnlm Inject 1 mL into the skin every 30 (thirty) days.   famotidine  20 MG tablet Commonly known as: PEPCID  Take 1 tablet (20 mg total) by mouth 2 (two) times daily.  gabapentin  600 MG tablet Commonly known as: NEURONTIN  Take 1 tablet (600 mg total) by mouth 2 (two) times daily. What changed: when to take this   Gvoke HypoPen  2-Pack 1 MG/0.2ML Soaj Generic drug: Glucagon  Inject 1 mg into the skin as needed (hypoglycemia).   HYDROmorphone  2 MG tablet Commonly known as: DILAUDID  Take 0.5 tablets (1 mg total) by mouth every 6 (six) hours as needed for severe pain (pain score 7-10).   levothyroxine  112 MCG tablet Commonly known as: SYNTHROID  TAKE 1 TABLET BY MOUTH ONCE DAILY BEFORE BREAKFAST   metFORMIN 500 MG tablet Commonly known as: GLUCOPHAGE Take 500 mg by mouth 2 (two) times daily.   multivitamin  with minerals Tabs tablet Take 1 tablet by mouth daily.   NovoLOG  FlexPen 100 UNIT/ML FlexPen Generic drug: insulin  aspart Check blood sugars with meals and administer as follows: BG < 200 - no insulin ; BG 200-249 = 2 units; BG 250-299 = 4 units; BG 300-349 = 6 units; BG 350-399 = 8 units; BG 400+ = 10 units and recheck in 15 minutes. Maximum of 30 units per day.   Oxcarbazepine  300 MG tablet Commonly known as: TRILEPTAL  Take 2 tablets (600 mg total) by mouth 2 (two) times daily.   pantoprazole  40 MG tablet Commonly known as: PROTONIX  Take 1 tablet (40 mg total) by mouth daily.   PARoxetine  40 MG tablet Commonly known as: PAXIL  Take 1 tablet (40 mg total) by mouth daily.   promethazine 12.5 MG tablet Commonly known as: PHENERGAN TAKE 1 TABLET BY MOUTH EVERY 6 HOURS AS NEEDED NAUSEA *NEW PRESCRIPTION REQUEST*   propranolol  20 MG tablet Commonly known as: INDERAL  Take 20 mg by mouth 2 (two) times daily as needed.   ranolazine  1000 MG SR tablet Commonly known as: RANEXA  TAKE ONE (1) TABLET BY MOUTH TWICE DAILY *NEW PRESCRIPTION REQUEST*   sucralfate  1 g tablet Commonly known as: CARAFATE  Take 1 tablet (1 g total) by mouth 4 (four) times daily.   Trokendi  XR 200 MG Cp24 Generic drug: Topiramate  ER Take 1 capsule (200 mg total) by mouth daily.   Ubrelvy  100 MG Tabs Generic drug: Ubrogepant  Take 1 tablet (100 mg total) by mouth as needed.   Vitamin D  (Ergocalciferol ) 1.25 MG (50000 UNIT) Caps capsule Commonly known as: DRISDOL  TAKE 1 CAPSULE BY MOUTH EVERY WEEK *NEW PRESCRIPTION REQUEST*   vitamin D3 25 MCG tablet Commonly known as: CHOLECALCIFEROL  Take 1 tablet (1,000 Units total) by mouth daily.        Discharge Exam: Filed Weights   01/26/24 0454  Weight: 92.9 kg    General.  NAD, lying comfortably Pulmonary.  Lungs clear bilaterally, normal respiratory effort. CV.  Regular rate and rhythm, no JVD, rub or murmur. Abdomen.  Soft, BS positive, non  tender CNS: Awake, alert, No focal neurologic deficit.  Extremities.  No edema  Condition at discharge: fair  The results of significant diagnostics from this hospitalization (including imaging, microbiology, ancillary and laboratory) are listed below for reference.   Imaging Studies: CT ABDOMEN PELVIS W CONTRAST Result Date: 02/02/2024 CLINICAL DATA:  Right lower quadrant abdominal pain EXAM: CT ABDOMEN AND PELVIS WITH CONTRAST TECHNIQUE: Multidetector CT imaging of the abdomen and pelvis was performed using the standard protocol following bolus administration of intravenous contrast. RADIATION DOSE REDUCTION: This exam was performed according to the departmental dose-optimization program which includes automated exposure control, adjustment of the mA and/or kV according to patient size and/or use of iterative reconstruction technique. CONTRAST:  100mL OMNIPAQUE   IOHEXOL  300 MG/ML  SOLN COMPARISON:  01/28/2024 FINDINGS: Lower chest: No acute pleural or parenchymal lung disease. Hepatobiliary: No focal liver abnormality is seen. Status post cholecystectomy. No biliary dilatation. Pancreas: Unremarkable. No pancreatic ductal dilatation or surrounding inflammatory changes. 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 Adrenals/Urinary Tract: Adrenal glands are unremarkable. Kidneys are normal, without renal calculi, focal lesion, or hydronephrosis. Bladder is unremarkable. Stomach/Bowel: No bowel obstruction or ileus. There is scattered gas fluid levels throughout the colon consistent with diarrhea or recent laxative/enema use. Colonic wall thickening is seen from the cecum through the splenic flexure, compatible with inflammatory or infectious colitis. Borderline size of the appendix measuring 9 mm in diameter, with no appendiceal wall thickening or periappendiceal fat stranding to suggest appendicitis. Postsurgical changes from bariatric surgery.  Vascular/Lymphatic: No significant vascular findings are present. No enlarged abdominal or pelvic lymph nodes. Reproductive: Status post hysterectomy. No adnexal masses. Other: Trace free fluid within the right paracolic gutter and right lower quadrant. No fluid collection or abscess. No free intraperitoneal gas. No abdominal wall hernia. Musculoskeletal: There is subcutaneous fat stranding within the bilateral flanks and lower anterior abdominal wall, right greater than left, similar to prior exam. No acute or destructive bony abnormalities. Reconstructed images demonstrate no additional findings. IMPRESSION: 1. Stable findings of inflammatory or infectious colitis with colonic wall thickening from the cecum through the splenic flexure again noted. 2. Stable borderline dilation of the appendix, with no secondary signs of appendicitis on this exam. 3. Subcutaneous fat stranding within the bilateral flanks and lower anterior abdominal wall, similar to prior study, compatible with edema or cellulitis. 4. Trace free fluid within the right hemiabdomen. No fluid collection or abscess. Electronically Signed   By: Ozell Daring M.D.   On: 02/02/2024 15:01   DG Abd 1 View Result Date: 02/01/2024 CLINICAL DATA:  Abdomen pain EXAM: ABDOMEN - 1 VIEW COMPARISON:  09/12/2022, CT 01/28/2024 FINDINGS: Clips in the upper quadrants and left lower quadrant. Nonobstructed gas pattern. Suspicion of thumbprinting involving the right and transverse colon, likely corresponding to colitis seen on recent CT. IMPRESSION: Nonobstructed gas pattern. Suspicion of thumbprinting/colonic edema involving the right and transverse colon, likely corresponding to colitis seen on recent CT. Electronically Signed   By: Luke Bun M.D.   On: 02/01/2024 16:44   EEG adult Result Date: 01/28/2024 Gregg Lek, MD     01/28/2024  6:42 PM Patient Name: Diana Quinn MRN: [id number] Epilepsy Attending: Lek Gregg Referring  Physician/Provider: No ref. provider found     Date: 01/28/2024 Duration: Patient history: 58 year old woman with altered mental status, with generalized body twitching. Level of alertness: lethargic AEDs during EEG study: Clonazepam , Gabapentin , Topiramate , Oxcarbazepine  Technical aspects: This EEG study was done with scalp electrodes positioned according to the 10-20 International system of electrode placement. Electrical activity was reviewed with band pass filter of 1-70Hz , sensitivity of 7 uV/mm, display speed of 28mm/sec with a 60Hz  notched filter applied as appropriate. EEG data were recorded continuously and digitally stored.  Video monitoring was available and reviewed as appropriate. Description: EEG showed continuous generalized polymorphic sharply contoured 3 to 6 Hz theta-delta slowing. Hyperventilation and photic stimulation were not performed.   ABNORMALITY - Continuous slow, generalized IMPRESSION: This study is suggestive of moderate diffuse encephalopathy, nonspecific etiology but likely related to sedation, toxic-metabolic etiology. No seizures or epileptiform discharges were seen throughout the recording. Lek Gregg MD Neurology    CT ABDOMEN PELVIS W CONTRAST  Result Date: 01/28/2024 EXAM: CT ABDOMEN AND PELVIS WITH CONTRAST 01/28/2024 01:30:35 PM TECHNIQUE: CT of the abdomen and pelvis was performed with the administration of intravenous contrast. 100 mL of iohexol  (OMNIPAQUE ) 300 MG/ML solution was administered. Multiplanar reformatted images are provided for review. Automated exposure control, iterative reconstruction, and/or weight-based adjustment of the mA/kV was utilized to reduce the radiation dose to as low as reasonably achievable. COMPARISON: 3 days ago. CLINICAL HISTORY: Abdominal pain, acute, nonlocalized; Worsening abdominal pain, fever, leukocytosis admitted with colitis, CT to reassess progression. FINDINGS: LOWER CHEST: Minimal bibasilar subsegmental atelectasis. LIVER:  The liver is unremarkable. GALLBLADDER AND BILE DUCTS: Status post cholecystectomy. No biliary ductal dilatation. SPLEEN: Status post splenectomy. PANCREAS: No acute abnormality. ADRENAL GLANDS: No acute abnormality. KIDNEYS, URETERS AND BLADDER: No stones in the kidneys or ureters. No hydronephrosis. No perinephric or periureteral stranding. Urinary bladder is decompressed secondary to foley catheter. GI AND BOWEL: Status post gastric bypass. Right, transverse and descending colon consistent with infectious or inflammatory colitis. Stable appendiceal thickening is noted compared to prior exam which may be reactive or secondary to adjacent inflammation. There is no bowel obstruction. PERITONEUM AND RETROPERITONEUM: No ascites. No free air. VASCULATURE: Aorta is normal in caliber. LYMPH NODES: No lymphadenopathy. REPRODUCTIVE ORGANS: Status post hysterectomy. BONES AND SOFT TISSUES: Old L1 fracture is noted. No focal soft tissue abnormality. IMPRESSION: 1. Right, transverse, and descending colon findings consistent with infectious or inflammatory colitis. 2. Stable appendiceal thickening compared to prior exam, possibly reactive or secondary to adjacent inflammation. Electronically signed by: Lynwood Seip MD 01/28/2024 01:55 PM EST RP Workstation: [id number]   CT HEAD WO CONTRAST ( ) Result Date: 01/28/2024 EXAM: CT HEAD WITHOUT CONTRAST 01/28/2024 01:30:35 PM TECHNIQUE: CT of the head was performed without the administration of intravenous contrast. Automated exposure control, iterative reconstruction, and/or weight based adjustment of the mA/kV was utilized to reduce the radiation dose to as low as reasonably achievable. COMPARISON: Head CT 06/12/2023 and MRI 06/07/2019. CLINICAL HISTORY: Mental status change, unknown cause. FINDINGS: BRAIN AND VENTRICLES: There is no evidence of an acute infarct, intracranial hemorrhage, mass, midline shift, hydrocephalus, or extra-axial fluid collection. Cerebral volume is  within normal limits for age. ORBITS: No acute abnormality. SINUSES: Minimal mucosal thickening in the included portions of the paranasal sinuses. Clear mastoid air cells. SOFT TISSUES AND SKULL: No acute soft tissue abnormality. No skull fracture. IMPRESSION: 1. No acute intracranial abnormality. Electronically signed by: Dasie Hamburg MD 01/28/2024 01:41 PM EST RP Workstation: [id number]   US  RENAL Result Date: 01/27/2024 EXAM: US  Retroperitoneum Complete, Renal. 01/27/2024 10:31:13 AM TECHNIQUE: Real-time ultrasonography of the retroperitoneum renal was performed. COMPARISON: None available CLINICAL HISTORY: Acute kidney injury. FINDINGS: FINDINGS: RIGHT KIDNEY/URETER: Right kidney measures 9.9 x 4.8 x 4.5 cm with a volume of 112.3 cc. Normal cortical echogenicity. No hydronephrosis. No calculus. No mass. LEFT KIDNEY/URETER: Left kidney measures 9.5 x 5.9 x 4.8 cm with a volume of 140.3 cc. Normal cortical echogenicity. No hydronephrosis. No calculus. No mass. BLADDER: Unremarkable appearance of the bladder. Bilateral ureteral jets visualized IMPRESSION: 1. No acute renal abnormality identified to explain acute kidney injury. Electronically signed by: Waddell Calk MD 01/27/2024 01:45 PM EST RP Workstation: [id number]   CT ABDOMEN PELVIS W CONTRAST Result Date: 01/25/2024 EXAM: CT ABDOMEN AND PELVIS WITH CONTRAST 01/25/2024 10:22:02 PM TECHNIQUE: CT of the abdomen and pelvis was performed with the administration of 75 mL of iohexol  (OMNIPAQUE ) 300 MG/ML solution. Multiplanar reformatted images are provided for review. Automated exposure control, iterative reconstruction,  and/or weight-based adjustment of the mA/kV was utilized to reduce the radiation dose to as low as reasonably achievable. COMPARISON: CT abdomen and pelvis 09/24/2018. CLINICAL HISTORY: Abdominal pain, acute, nonlocalized; Sepsis. FINDINGS: LOWER CHEST: No acute abnormality. LIVER: The liver is unremarkable. GALLBLADDER AND BILE DUCTS:  Status post cholecystectomy. No biliary ductal dilatation. SPLEEN: No acute abnormality. PANCREAS: Diffusely atrophic. No focal lesion. Otherwise normal pancreatic contour. No surrounding inflammatory changes. No main pancreatic ductal dilatation. ADRENAL GLANDS: No acute abnormality. KIDNEYS, URETERS AND BLADDER: No stones in the kidneys or ureters. No hydronephrosis. No perinephric or periureteral stranding. No filling defects of the partially visualized collecting systems on delayed imaging. Urinary bladder is unremarkable. GI AND BOWEL: Roux-en-y gastric bypass surgical changes. Marked ascending, transverse, descending colon bowel thickening, pericolonic fat stranding, and mucosal hyperemia. Appendiceal wall thickening with caliber up to 9 mm. No pneumatosis. There is no bowel obstruction. PERITONEUM AND RETROPERITONEUM: Trace simple free fluid  ascites. No free air. No organized fluid collection. VASCULATURE: Aorta is normal in caliber. LYMPH NODES: No lymphadenopathy. REPRODUCTIVE ORGANS: No acute abnormality. BONES AND SOFT TISSUES: Interval development of a chronic appearing L1 compression fracture with at least 60% vertebral body height loss. No focal soft tissue abnormality. IMPRESSION: 1. Findings consistent with colitis involving the ascending, transverse, and descending colon, without pneumatosis or perforation. 2. Appendiceal wall thickening with caliber up to 9 mm. Findings may be reactive in etiology in the setting of colitis. 3. Chronic-appearing L1 compression fracture with at least 60% vertebral body height loss. Electronically signed by: Morgane Naveau MD 01/25/2024 11:16 PM EST RP Workstation: [id number]    Microbiology: Results for orders placed or performed during the hospital encounter of 01/25/24  Culture, blood (Routine X 2) w Reflex to ID Panel     Status: None   Collection Time: 01/26/24  1:57 AM   Specimen: BLOOD  Result Value Ref Range Status   Specimen Description BLOOD BLOOD  RIGHT HAND  Final   Special Requests IN PEDIATRIC BOTTLE Blood Culture adequate volume  Final   Culture   Final    NO GROWTH 5 DAYS Performed at [facility], [address] [place], [place] [address]    Report Status 01/31/2024 FINAL  Final  Culture, blood (Routine X 2) w Reflex to ID Panel     Status: None   Collection Time: 01/26/24  1:57 AM   Specimen: BLOOD  Result Value Ref Range Status   Specimen Description BLOOD BLOOD LEFT HAND  Final   Special Requests   Final    BOTTLES DRAWN AEROBIC ONLY Blood Culture results may not be optimal due to an inadequate volume of blood received in culture bottles   Culture   Final    NO GROWTH 5 DAYS Performed at [facility], [address], [place], [place] [address]    Report Status 01/31/2024 FINAL  Final  C Difficile Quick Screen w PCR reflex     Status: None   Collection Time: 01/26/24 10:00 PM   Specimen: STOOL  Result Value Ref Range Status   C Diff antigen NEGATIVE NEGATIVE Final   C Diff toxin NEGATIVE NEGATIVE Final   C Diff interpretation No C. difficile detected.  Final    Comment: Performed at [facility], [address] [place], [place] [address]  Gastrointestinal Panel by PCR , Stool     Status: None   Collection Time: 01/26/24 10:00 PM   Specimen: Stool  Result Value Ref Range Status  Campylobacter species NOT DETECTED NOT DETECTED Final   Plesimonas shigelloides NOT DETECTED NOT DETECTED Final   Salmonella species NOT DETECTED NOT DETECTED Final   Yersinia enterocolitica NOT DETECTED NOT DETECTED Final   Vibrio species NOT DETECTED NOT DETECTED Final   Vibrio cholerae NOT DETECTED NOT DETECTED Final   Enteroaggregative E coli (EAEC) NOT DETECTED NOT DETECTED Final   Enteropathogenic E coli (EPEC) NOT DETECTED NOT DETECTED Final   Enterotoxigenic E coli (ETEC) NOT DETECTED NOT DETECTED Final   Shiga like toxin producing E coli (STEC) NOT DETECTED NOT DETECTED Final    Shigella/Enteroinvasive E coli (EIEC) NOT DETECTED NOT DETECTED Final   Cryptosporidium NOT DETECTED NOT DETECTED Final   Cyclospora cayetanensis NOT DETECTED NOT DETECTED Final   Entamoeba histolytica NOT DETECTED NOT DETECTED Final   Giardia lamblia NOT DETECTED NOT DETECTED Final   Adenovirus F40/41 NOT DETECTED NOT DETECTED Final   Astrovirus NOT DETECTED NOT DETECTED Final   Norovirus GI/GII NOT DETECTED NOT DETECTED Final   Rotavirus A NOT DETECTED NOT DETECTED Final   Sapovirus (I, II, IV, and V) NOT DETECTED NOT DETECTED Final    Comment: Performed at [facility], [address] [place], [place] [address]  Calprotectin, Fecal     Status: Abnormal   Collection Time: 01/27/24 11:20 AM   Specimen: Stool  Result Value Ref Range Status   Calprotectin, Fecal 2,350 (H) 0 - 120 ug/g Final    Comment: (NOTE) **Results verified by repeat testing** Concentration     Interpretation   Follow-Up < 5 - 50 ug/g     Normal           None >50 -120 ug/g     Borderline       Re-evaluate in 4-6 weeks    >120 ug/g     Abnormal         Repeat as clinically                                   indicated Performed At: [facility] [employer] [address] [place], [place] [id number] Jennette Shorter MD Ey:[id number]   Culture, blood (Routine X 2) w Reflex to ID Panel     Status: None   Collection Time: 01/27/24  7:19 PM   Specimen: BLOOD LEFT ARM  Result Value Ref Range Status   Specimen Description BLOOD LEFT ARM  Final   Special Requests   Final    BOTTLES DRAWN AEROBIC AND ANAEROBIC Blood Culture adequate volume   Culture   Final    NO GROWTH 5 DAYS Performed at [facility], [address] [place], [place] [address]    Report Status 02/01/2024 FINAL  Final  Culture, blood (Routine X 2) w Reflex to ID Panel     Status: None   Collection Time: 01/27/24  7:30 PM   Specimen: BLOOD LEFT HAND  Result Value Ref Range Status   Specimen Description BLOOD LEFT HAND  Final    Special Requests   Final    BOTTLES DRAWN AEROBIC ONLY Blood Culture results may not be optimal due to an inadequate volume of blood received in culture bottles   Culture   Final    NO GROWTH 5 DAYS Performed at [facility], [address] [place], [place] [address]    Report Status 02/01/2024 FINAL  Final    Labs: CBC: Recent Labs  Lab 01/31/24 [phone]  02/01/24 0552 02/02/24 1049 02/03/24 0153 02/04/24 0503  WBC 19.7* 18.1* 13.9* 19.1* 8.4  HGB 8.5* 9.1* 8.8* 9.0* 9.2*  HCT 24.5* 26.9* 26.1* 26.6* 27.9*  MCV 90.1 91.5 91.6 92.4 93.9  PLT 310 295 490* 548* 654*   Basic Metabolic Panel: Recent Labs  Lab 01/30/24 0435 01/31/24 0651 02/01/24 0552 02/02/24 1049 02/03/24 0153 02/04/24 0503 02/05/24 0759  NA 138 133* 134* 139 138 141 138  K 3.6 4.1 3.5 3.8 4.2 3.9 5.2*  CL 101 103 106 108 110 108 109  CO2 23 21* 19* 19* 19* 21* 18*  GLUCOSE 111* 109* 96 134* 128* 102* 105*  BUN 11 9 10 9 9 8 7   CREATININE 0.93 0.75 0.70 0.69 0.65 0.71 0.64  CALCIUM  7.8* 7.7* 7.9* 8.0* 8.2* 8.2* 8.1*  MG 2.3 2.1 2.1 2.0 2.1  --   --   PHOS 4.2 2.5 2.8 3.1 3.2  --   --    Liver Function Tests: No results for input(s): AST, ALT, ALKPHOS, BILITOT, PROT, ALBUMIN in the last 168 hours. CBG: Recent Labs  Lab 02/04/24 1140 02/04/24 1631 02/04/24 2034 02/05/24 0830 02/05/24 1212  GLUCAP 122* 99 114* 103* 89    Discharge time spent: greater than 30 minutes.  Signed: Laree Lock, MD [facility] 02/05/2024

## 2024-02-05 NOTE — Progress Notes (Signed)
 Physical Therapy Treatment Patient Details Name: Diana Quinn MRN: [id number] DOB: 06/01/1965 Today's Date: 02/05/2024   History of Present Illness Pt is a 58 y/o F presenting to ED with c/o abdominal pain and diarrhea. Workup for acute colitis. PMH significant for HTN, HLD, DM, CAD, hypothyroidism, depression with anxiety, bipolar disorder, schizophrenia, CKD stage II, afib, seizures.    PT Comments  Pt is making good progress towards goals with ability to ambulate 2 laps in room. Heavily antalgic gait and limited in distance due to pain. Will continue to progress as able.    If plan is discharge home, recommend the following: A little help with walking and/or transfers   Can travel by private vehicle     Yes  Equipment Recommendations  None recommended by PT    Recommendations for Other Services       Precautions / Restrictions Precautions Precautions: Fall Recall of Precautions/Restrictions: Intact Restrictions Weight Bearing Restrictions Per Provider Order: No     Mobility  Bed Mobility Overal bed mobility: Needs Assistance Bed Mobility: Supine to Sit Rolling: Supervision, Used rails   Supine to sit: Supervision     General bed mobility comments: safe technique. Slow movement noted    Transfers Overall transfer level: Needs assistance Equipment used: 1 person hand held assist Transfers: Sit to/from Stand Sit to Stand: Contact guard assist           General transfer comment: safe technique with slow movement noted    Ambulation/Gait Ambulation/Gait assistance: Contact guard assist Gait Distance (Feet): 40 Feet Assistive device: 1 person hand held assist Gait Pattern/deviations: Step-to pattern, Trunk flexed       General Gait Details: ambulated without AD with slow and guarded movement. Safe technique although antalgic gait noted   Stairs             Wheelchair Mobility     Tilt Bed    Modified Rankin (Stroke Patients Only)        Balance Overall balance assessment: Needs assistance Sitting-balance support: Feet supported Sitting balance-Leahy Scale: Good     Standing balance support: Bilateral upper extremity supported, During functional activity Standing balance-Leahy Scale: Fair                              Hotel Manager: No apparent difficulties  Cognition Arousal: Alert Behavior During Therapy: WFL for tasks assessed/performed   PT - Cognitive impairments: No apparent impairments                       PT - Cognition Comments: pleasant and agreeable Following commands: Intact      Cueing Cueing Techniques: Verbal cues  Exercises      General Comments        Pertinent Vitals/Pain Pain Assessment Pain Assessment: Faces Faces Pain Scale: Hurts little more Pain Location: Abdomen, with movement Pain Descriptors / Indicators: Grimacing, Guarding, Sharp Pain Intervention(s): Limited activity within patient's tolerance, Premedicated before session    Home Living                          Prior Function            PT Goals (current goals can now be found in the care plan section) Acute Rehab PT Goals Patient Stated Goal: to get better PT Goal Formulation: With patient Time For Goal Achievement: 02/10/24 Potential to Achieve Goals:  Good Progress towards PT goals: Progressing toward goals    Frequency    Min 2X/week      PT Plan      Co-evaluation              AM-PAC PT 6 Clicks Mobility   Outcome Measure  Help needed turning from your back to your side while in a flat bed without using bedrails?: None Help needed moving from lying on your back to sitting on the side of a flat bed without using bedrails?: None Help needed moving to and from a bed to a chair (including a wheelchair)?: A Little Help needed standing up from a chair using your arms (e.g., wheelchair or bedside chair)?: A Little Help needed to  walk in hospital room?: A Little Help needed climbing 3-5 steps with a railing? : A Lot 6 Click Score: 19    End of Session   Activity Tolerance: Patient limited by pain Patient left: in bed;with bed alarm set Nurse Communication: Mobility status PT Visit Diagnosis: Other abnormalities of gait and mobility (R26.89);Muscle weakness (generalized) (M62.81);Difficulty in walking, not elsewhere classified (R26.2);Pain     Time: 1033-1050 PT Time Calculation (min) (ACUTE ONLY): 17 min  Charges:    $Gait Training: 8-22 mins PT General Charges $$ ACUTE PT VISIT: 1 Visit                     Corean Dade, PT, DPT, GCS [phone]    Alain Deschene 02/05/2024, 2:54 PM

## 2024-02-05 NOTE — Plan of Care (Signed)
  Problem: Coping: Goal: Ability to adjust to condition or change in health will improve Outcome: Progressing   Problem: Fluid Volume: Goal: Ability to maintain a balanced intake and output will improve Outcome: Progressing   Problem: Health Behavior/Discharge Planning: Goal: Ability to identify and utilize available resources and services will improve Outcome: Progressing   Problem: Nutritional: Goal: Maintenance of adequate nutrition will improve Outcome: Progressing Goal: Progress toward achieving an optimal weight will improve Outcome: Progressing   Problem: Skin Integrity: Goal: Risk for impaired skin integrity will decrease Outcome: Progressing   Problem: Education: Goal: Knowledge of General Education information will improve Description: Including pain rating scale, medication(s)/side effects and non-pharmacologic comfort measures Outcome: Progressing   Problem: Health Behavior/Discharge Planning: Goal: Ability to manage health-related needs will improve Outcome: Progressing   Problem: Clinical Measurements: Goal: Will remain free from infection Outcome: Progressing Goal: Diagnostic test results will improve Outcome: Progressing Goal: Respiratory complications will improve Outcome: Progressing Goal: Cardiovascular complication will be avoided Outcome: Progressing

## 2024-02-10 ENCOUNTER — Emergency Department

## 2024-02-10 ENCOUNTER — Other Ambulatory Visit: Payer: Self-pay

## 2024-02-10 ENCOUNTER — Ambulatory Visit: Admitting: Family

## 2024-02-10 ENCOUNTER — Emergency Department
Admission: EM | Admit: 2024-02-10 | Discharge: 2024-02-10 | Disposition: A | Discharge status: Home / Self Care | Source: Ambulatory Visit | Attending: Emergency Medicine | Admitting: Emergency Medicine

## 2024-02-10 ENCOUNTER — Encounter: Payer: Self-pay | Admitting: Family

## 2024-02-10 VITALS — BP 102/70 | HR 90 | Ht 63.0 in | Wt 190.2 lb

## 2024-02-10 DIAGNOSIS — Z0130 Encounter for examination of blood pressure without abnormal findings: Secondary | ICD-10-CM

## 2024-02-10 DIAGNOSIS — D75839 Thrombocytosis, unspecified: Secondary | ICD-10-CM | POA: Insufficient documentation

## 2024-02-10 DIAGNOSIS — Z794 Long term (current) use of insulin: Secondary | ICD-10-CM

## 2024-02-10 DIAGNOSIS — K529 Noninfective gastroenteritis and colitis, unspecified: Secondary | ICD-10-CM | POA: Diagnosis not present

## 2024-02-10 DIAGNOSIS — E118 Type 2 diabetes mellitus with unspecified complications: Secondary | ICD-10-CM

## 2024-02-10 DIAGNOSIS — R109 Unspecified abdominal pain: Secondary | ICD-10-CM | POA: Diagnosis present

## 2024-02-10 LAB — URINALYSIS, ROUTINE W REFLEX MICROSCOPIC
Bilirubin Urine: NEGATIVE
Glucose, UA: NEGATIVE mg/dL
Hgb urine dipstick: NEGATIVE
Ketones, ur: NEGATIVE mg/dL
Leukocytes,Ua: NEGATIVE
Nitrite: NEGATIVE
Protein, ur: NEGATIVE mg/dL
Specific Gravity, Urine: 1.021 (ref 1.005–1.030)
pH: 6 (ref 5.0–8.0)

## 2024-02-10 LAB — CBC
HCT: 34.6 % — ABNORMAL LOW (ref 36.0–46.0)
Hemoglobin: 11.3 g/dL — ABNORMAL LOW (ref 12.0–15.0)
MCH: 31.1 pg (ref 26.0–34.0)
MCHC: 32.7 g/dL (ref 30.0–36.0)
MCV: 95.3 fL (ref 80.0–100.0)
Platelets: 1077 K/uL (ref 150–400)
RBC: 3.63 MIL/uL — ABNORMAL LOW (ref 3.87–5.11)
RDW: 17.3 % — ABNORMAL HIGH (ref 11.5–15.5)
WBC: 6.8 K/uL (ref 4.0–10.5)
nRBC: 0.4 % — ABNORMAL HIGH (ref 0.0–0.2)

## 2024-02-10 LAB — COMPREHENSIVE METABOLIC PANEL WITH GFR
ALT: 13 U/L (ref 0–44)
AST: 24 U/L (ref 15–41)
Albumin: 3.4 g/dL — ABNORMAL LOW (ref 3.5–5.0)
Alkaline Phosphatase: 270 U/L — ABNORMAL HIGH (ref 38–126)
Anion gap: 14 (ref 5–15)
BUN: 9 mg/dL (ref 6–20)
CO2: 17 mmol/L — ABNORMAL LOW (ref 22–32)
Calcium: 8.2 mg/dL — ABNORMAL LOW (ref 8.9–10.3)
Chloride: 101 mmol/L (ref 98–111)
Creatinine, Ser: 1.01 mg/dL — ABNORMAL HIGH (ref 0.44–1.00)
GFR, Estimated: >60 mL/min (ref >60)
Glucose, Bld: 155 mg/dL — ABNORMAL HIGH (ref 70–99)
Potassium: 3.6 mmol/L (ref 3.5–5.1)
Sodium: 132 mmol/L — ABNORMAL LOW (ref 135–145)
Total Bilirubin: 0.4 mg/dL (ref 0.0–1.2)
Total Protein: 7.1 g/dL (ref 6.5–8.1)

## 2024-02-10 LAB — LIPASE, BLOOD: Lipase: 19 U/L (ref 11–51)

## 2024-02-10 MED ORDER — IOHEXOL 350 MG/ML SOLN
75.0000 mL | Freq: Once | INTRAVENOUS | Status: AC | PRN
  Administered 2024-02-10: 75 mL via INTRAVENOUS

## 2024-02-10 MED ORDER — DICYCLOMINE HCL 10 MG PO CAPS: 10.0000 mg | ORAL_CAPSULE | Freq: Three times a day (TID) | ORAL | 0 refills | Status: AC | PRN

## 2024-02-10 MED ORDER — SODIUM CHLORIDE 0.9 % IV BOLUS
1000.0000 mL | Freq: Once | INTRAVENOUS | Status: AC
  Administered 2024-02-10: 1000 mL via INTRAVENOUS

## 2024-02-10 MED ORDER — FENTANYL CITRATE (PF) 50 MCG/ML IJ SOSY
50.0000 ug | PREFILLED_SYRINGE | Freq: Once | INTRAMUSCULAR | Status: AC
  Administered 2024-02-10: 50 ug via INTRAVENOUS
  Filled 2024-02-10 (×2): qty 1

## 2024-02-10 MED ORDER — DICYCLOMINE HCL 10 MG PO CAPS
10.0000 mg | ORAL_CAPSULE | Freq: Once | ORAL | Status: AC
  Administered 2024-02-10: 10 mg via ORAL
  Filled 2024-02-10: qty 1

## 2024-02-10 NOTE — Assessment & Plan Note (Signed)
-   Advised patient to go to local ED for further evaluation. - FU within 7 days

## 2024-02-10 NOTE — ED Triage Notes (Signed)
 Pt comes with c/o fever and belly pain. Pt was just released from hospital on Friday after having infection. Pt states she didn't fee l good all weekend and this morning she had fever. Doctor sent her here.

## 2024-02-10 NOTE — Progress Notes (Unsigned)
 Established Patient Office Visit  Subjective:  Patient ID: Diana Quinn, female    DOB: 1965/04/26  Age: 58 y.o. MRN: [id number]  Chief Complaint  Patient presents with   Follow-up    1 month & Hosp F/U for Ulcerative Collitis    Patient states she does not feel well today and thinks she should return to the ED. Due to patients recent hospitalization for colitis 01/25/24-02/05/24 and her acute abdominal symptoms today advised patient to go to the emergency room and will reschedule FU appointment when patient is well. Patient son is accompanying her and states he will drive her to the ED.    No other concerns at this time.   Past Medical History:  Diagnosis Date   Abdominal pain, acute, right upper quadrant 12/12/2014   Abnormal weight loss 05/10/2014   Acute metabolic encephalopathy 09/23/2022   Allergic reaction 09/17/2016   AMS (altered mental status) 06/12/2023   Anemia    Ankle fracture 09/10/2022   Appetite loss 01/14/2015   Bilateral ankle fractures 09/25/2022   CAD (coronary artery disease) 08/10/2020   Closed fracture of left distal tibia 09/10/2022   Closed torus fracture of upper end of right fibula 04/13/2019   E. coli UTI 09/18/2022   EKG, abnormal 02/27/2013   Elevated lithium  level 05/10/2014   Gastric bypass status for obesity    H/O unstable angina    History of seizure 09/24/2022   Hx SBO 09/30/2014   Hypoglycemia    Hypokalemia 10/03/2022   Hyponatremia 09/17/2022   MI (myocardial infarction) [facility])    Migraine headache 03/24/2011   Note: Unchanged - sees [facility], Dr. German  Note: Unchanged - sees [facility], Dr. German Deal of this note might be different from the original.  Note: Unchanged - sees [facility] neurolog[facility], Dr. German     Occipital headache (Fourth Area of Pain) (Bilateral) (L>R) 03/31/2018   Orthostatic hypotension    Osteoporosis    PEG tube malfunction (HCC) 01/13/2015   Poisoning by unspecified drugs,  medicaments and biological substances, accidental (unintentional), initial encounter 02/27/2013   Polysubstance abuse (HCC) 11/30/2016   SBO (small bowel obstruction) (HCC) 09/23/2014   Sciatic nerve disease    Seizures (HCC)    Skin infection at gastrostomy tube site (HCC) 12/29/2014   Suicidal behavior 02/27/2013   Thyroid  disease    TIA (transient ischemic attack) 04/22/2017    Past Surgical History:  Procedure Laterality Date   ABDOMINAL ADHESION SURGERY     ABDOMINAL HYSTERECTOMY     CHOLECYSTECTOMY     COLONOSCOPY N/A 01/31/2024   Procedure: COLONOSCOPY;  Surgeon: Maryruth Ole DASEN, MD;  Location: [facility] ENDOSCOPY;  Service: Endoscopy;  Laterality: N/A;   DILATION AND CURETTAGE OF UTERUS     ESOPHAGOGASTRODUODENOSCOPY     GASTRIC BYPASS     GASTRIC BYPASS OPEN     revision   GASTROSTOMY W/ FEEDING TUBE     HERNIA REPAIR     OOPHORECTOMY     SPLENECTOMY     TONSILLECTOMY      Social History   Socioeconomic History   Marital status: Widowed    Spouse name: Not on file   Number of children: Not on file   Years of education: Not on file   Highest education level: Not on file  Occupational History   Not on file  Tobacco Use   Smoking status: Never   Smokeless tobacco: Never  Vaping Use   Vaping status: Never  Used  Substance and Sexual Activity   Alcohol use: Not Currently   Drug use: Never   Sexual activity: Not on file  Other Topics Concern   Not on file  Social History Narrative   Not on file   Social Drivers of Health   Financial Resource Strain: Not on file  Food Insecurity: No Food Insecurity (01/26/2024)   Hunger Vital Sign    Worried About Running Out of Food in the Last Year: Never true    Ran Out of Food in the Last Year: Never true  Transportation Needs: No Transportation Needs (01/26/2024)   PRAPARE - Administrator, Civil Service (Medical): No    Lack of Transportation (Non-Medical): No  Physical Activity: Not on file  Stress:  Not on file  Social Connections: Socially Isolated (06/13/2023)   Social Connection and Isolation Panel    Frequency of Communication with Friends and Family: More than three times a week    Frequency of Social Gatherings with Friends and Family: More than three times a week    Attends Religious Services: Never    Database Administrator or Organizations: No    Attends Banker Meetings: Never    Marital Status: Widowed  Intimate Partner Violence: Not At Risk (01/26/2024)   Humiliation, Afraid, Rape, and Kick questionnaire    Fear of Current or Ex-Partner: No    Emotionally Abused: No    Physically Abused: No    Sexually Abused: No    Family History  Problem Relation Age of Onset   Cancer Mother    Hypertension Mother    COPD Mother    Asthma Sister    Psoriasis Sister    Epilepsy Brother     Allergies  Allergen Reactions   Depakote [Divalproex Sodium] Shortness Of Breath   Pregabalin Other (See Comments), Rash and Shortness Of Breath    Other Reaction: falls; decreased coordination  Pt ts that she passes out when she takes this med.   Other Reaction: falls; decreased coordination    Pt ts that she passes out when she takes this med.  Other Reaction: falls; decreased coordination, , Pt ts that she passes out when she takes this med.   Valproic Acid  Other (See Comments) and Shortness Of Breath    Other Reaction: Other reaction  SOB   Levetiracetam Itching and Rash    Review of Systems  Constitutional:  Positive for chills, diaphoresis, fever and malaise/fatigue.  HENT: Negative.    Eyes:  Negative for blurred vision and pain.  Respiratory:  Negative for cough and shortness of breath.   Cardiovascular:  Negative for chest pain, palpitations, claudication and leg swelling.  Gastrointestinal:  Positive for abdominal pain. Negative for blood in stool, constipation, diarrhea, nausea and vomiting.  Genitourinary:  Negative for dysuria, frequency and urgency.   Musculoskeletal: Negative.   Skin: Negative.   Neurological:  Negative for dizziness, tingling, sensory change and headaches.  Endo/Heme/Allergies: Negative.   Psychiatric/Behavioral: Negative.         Objective:   BP 102/70   Pulse 90   Ht 5' 3 (1.6 m)   Wt 190 lb 3.2 oz (86.3 kg)   SpO2 99%   BMI 33.69 kg/m   Vitals:   02/10/24 1108  BP: 102/70  Pulse: 90  Height: 5' 3 (1.6 m)  Weight: 190 lb 3.2 oz (86.3 kg)  SpO2: 99%  BMI (Calculated): 33.7    Physical Exam Vitals and nursing note  reviewed.  Constitutional:      Appearance: Normal appearance. She is ill-appearing and diaphoretic.  HENT:     Head: Normocephalic.  Eyes:     Extraocular Movements: Extraocular movements intact.     Pupils: Pupils are equal, round, and reactive to light.  Cardiovascular:     Rate and Rhythm: Normal rate and regular rhythm.     Pulses: Normal pulses.     Heart sounds: Normal heart sounds. No murmur heard. Pulmonary:     Effort: Pulmonary effort is normal. No respiratory distress.     Breath sounds: Normal breath sounds.  Abdominal:     General: There is no distension.     Tenderness: There is abdominal tenderness. There is guarding.  Musculoskeletal:        General: No tenderness. Normal range of motion.     Cervical back: Normal range of motion and neck supple.     Right lower leg: No edema.     Left lower leg: No edema.  Skin:    General: Skin is warm.     Coloration: Skin is not jaundiced.     Findings: No erythema.  Neurological:     General: No focal deficit present.     Mental Status: She is alert and oriented to person, place, and time.  Psychiatric:        Mood and Affect: Mood normal.        Speech: Speech normal.        Behavior: Behavior is cooperative.        Cognition and Memory: Memory is not impaired.      Results for orders placed or performed during the hospital encounter of 02/10/24  Lipase, blood  Result Value Ref Range   Lipase 19 11 - 51  U/L  Comprehensive metabolic panel  Result Value Ref Range   Sodium 132 (L) 135 - 145 mmol/L   Potassium 3.6 3.5 - 5.1 mmol/L   Chloride 101 98 - 111 mmol/L   CO2 17 (L) 22 - 32 mmol/L   Glucose, Bld 155 (H) 70 - 99 mg/dL   BUN 9 6 - 20 mg/dL   Creatinine, Ser 8.98 (H) 0.44 - 1.00 mg/dL   Calcium  8.2 (L) 8.9 - 10.3 mg/dL   Total Protein 7.1 6.5 - 8.1 g/dL   Albumin 3.4 (L) 3.5 - 5.0 g/dL   AST 24 15 - 41 U/L   ALT 13 0 - 44 U/L   Alkaline Phosphatase 270 (H) 38 - 126 U/L   Total Bilirubin 0.4 0.0 - 1.2 mg/dL   GFR, Estimated >39 >39 mL/min   Anion gap 14 5 - 15  CBC  Result Value Ref Range   WBC 6.8 4.0 - 10.5 K/uL   RBC 3.63 (L) 3.87 - 5.11 MIL/uL   Hemoglobin 11.3 (L) 12.0 - 15.0 g/dL   HCT 65.3 (L) 63.9 - 53.9 %   MCV 95.3 80.0 - 100.0 fL   MCH 31.1 26.0 - 34.0 pg   MCHC 32.7 30.0 - 36.0 g/dL   RDW 82.6 (H) 88.4 - 84.4 %   Platelets 1,077 (HH) 150 - 400 K/uL   nRBC 0.4 (H) 0.0 - 0.2 %    Recent Results (from the past 2160 hours)  CBC with Diff     Status: Abnormal   Collection Time: 11/17/23  3:43 PM  Result Value Ref Range   WBC 8.7 3.4 - 10.8 x10E3/uL   RBC 4.05 3.77 - 5.28 x10E6/uL  Hemoglobin 13.0 11.1 - 15.9 g/dL   Hematocrit 58.8 65.9 - 46.6 %   MCV 102 (H) 79 - 97 fL   MCH 32.1 26.6 - 33.0 pg   MCHC 31.6 31.5 - 35.7 g/dL   RDW 87.8 88.2 - 84.5 %   Platelets 307 150 - 450 x10E3/uL   Neutrophils 70 Not Estab. %   Lymphs 18 Not Estab. %   Monocytes 9 Not Estab. %   Eos 1 Not Estab. %   Basos 1 Not Estab. %   Neutrophils Absolute 6.1 1.4 - 7.0 x10E3/uL   Lymphocytes Absolute 1.6 0.7 - 3.1 x10E3/uL   Monocytes Absolute 0.8 0.1 - 0.9 x10E3/uL   EOS (ABSOLUTE) 0.1 0.0 - 0.4 x10E3/uL   Basophils Absolute 0.1 0.0 - 0.2 x10E3/uL   Immature Granulocytes 1 Not Estab. %   Immature Grans (Abs) 0.1 0.0 - 0.1 x10E3/uL  Lipid panel     Status: None   Collection Time: 11/17/23  3:43 PM  Result Value Ref Range   Cholesterol, Total 145 100 - 199 mg/dL    Triglycerides 875 0 - 149 mg/dL   HDL 89 >60 mg/dL   VLDL Cholesterol Cal 21 5 - 40 mg/dL   LDL Chol Calc (NIH) 35 0 - 99 mg/dL   Chol/HDL Ratio 1.6 0.0 - 4.4 ratio    Comment:                                   T. Chol/HDL Ratio                                             Men  Women                               1/2 Avg.Risk  3.4    3.3                                   Avg.Risk  5.0    4.4                                2X Avg.Risk  9.6    7.1                                3X Avg.Risk 23.4   11.0   VITAMIN D  25 Hydroxy (Vit-D Deficiency, Fractures)     Status: Abnormal   Collection Time: 11/17/23  3:43 PM  Result Value Ref Range   Vit D, 25-Hydroxy 22.9 (L) 30.0 - 100.0 ng/mL    Comment: Vitamin D  deficiency has been defined by the Institute of Medicine and an Endocrine Society practice guideline as a level of serum 25-OH vitamin D  less than 20 ng/mL (1,2). The Endocrine Society went on to further define vitamin D  insufficiency as a level between 21 and 29 ng/mL (2). 1. IOM (Institute of Medicine). 2010. Dietary reference    intakes for calcium  and D. [place]: The    [employer]. 2. Holick MF, Binkley [place], Bischoff-Ferrari HA, et al.  Evaluation, treatment, and prevention of vitamin D     deficiency: an Endocrine Society clinical practice    guideline. JCEM. 2011 Jul; 96(7):1911-30.   CMP14+EGFR     Status: Abnormal   Collection Time: 11/17/23  3:43 PM  Result Value Ref Range   Glucose 124 (H) 70 - 99 mg/dL   BUN 10 6 - 24 mg/dL   Creatinine, Ser 8.87 (H) 0.57 - 1.00 mg/dL   eGFR 58 (L) >40 fO/fpw/8.26   BUN/Creatinine Ratio 9 9 - 23   Sodium 141 134 - 144 mmol/L   Potassium 4.2 3.5 - 5.2 mmol/L   Chloride 108 (H) 96 - 106 mmol/L   CO2 19 (L) 20 - 29 mmol/L   Calcium  8.8 8.7 - 10.2 mg/dL   Total Protein 6.3 6.0 - 8.5 g/dL   Albumin 4.0 3.8 - 4.9 g/dL   Globulin, Total 2.3 1.5 - 4.5 g/dL   Bilirubin Total 0.2 0.0 - 1.2 mg/dL   Alkaline Phosphatase 218  (H) 44 - 121 IU/L    Comment: **Effective November 23, 2023 Alkaline Phosphatase**   reference interval will be changing to:              Age                Female          Female           0 -  5 days         47 - 127       47 - 127           6 - 10 days         29 - 242       29 - 242          11 - 20 days        109 - 357      109 - 357          21 - 30 days         94 - 494       94 - 494           1 -  2 months      149 - 539      149 - 539           3 -  6 months      131 - 452      131 - 452           7 - 11 months      117 - 401      117 - 401   12 months -  6 years       158 - 369      158 - 369           7 - 12 years       150 - 409      150 - 409               13 years       156 - 435       78 - 227               14 years       114 - 375       64 - 161               15 years  88 - 279       56 - 134               16 years        74 - 207       51 - 121               17 years        63 - 161       47 - 113          18 - 20 years        51 - 125       42 - 106          21 - 50 years         47 - 123       41 - 116          51 - 80 years        49 - 135       51 - 125              >80 years        48 - 129       48 - 129    AST 23 0 - 40 IU/L   ALT 24 0 - 32 IU/L  TSH     Status: None   Collection Time: 11/17/23  3:43 PM  Result Value Ref Range   TSH 0.823 0.450 - 4.500 uIU/mL  Hemoglobin A1c     Status: Abnormal   Collection Time: 11/17/23  3:43 PM  Result Value Ref Range   Hgb A1c MFr Bld 6.0 (H) 4.8 - 5.6 %    Comment:          Prediabetes: 5.7 - 6.4          Diabetes: >6.4          Glycemic control for adults with diabetes: <7.0    Est. average glucose Bld gHb Est-mCnc 126 mg/dL  Vitamin B12     Status: None   Collection Time: 11/17/23  3:43 PM  Result Value Ref Range   Vitamin B-12 370 232 - 1,245 pg/mL  POCT Urinalysis Dipstick (18997)     Status: Abnormal   Collection Time: 01/12/24  4:08 PM  Result Value Ref Range   Color, UA Yellow    Clarity, UA  Cloudy    Glucose, UA Negative Negative   Bilirubin, UA Moderate    Ketones, UA Negative    Spec Grav, UA >=1.030 (A) 1.010 - 1.025   Blood, UA Negative    pH, UA 6.0 5.0 - 8.0   Protein, UA Positive (A) Negative   Urobilinogen, UA 0.2 0.2 or 1.0 E.U./dL   Nitrite, UA Negative    Leukocytes, UA Small (1+) (A) Negative   Appearance Cloudy    Odor Yes   Comprehensive metabolic panel     Status: Abnormal   Collection Time: 01/25/24  8:33 PM  Result Value Ref Range   Sodium 132 (L) 135 - 145 mmol/L   Potassium 5.3 (H) 3.5 - 5.1 mmol/L    Comment: HEMOLYSIS AT THIS LEVEL MAY AFFECT RESULT   Chloride 100 98 - 111 mmol/L   CO2 18 (L) 22 - 32 mmol/L   Glucose, Bld 205 (H) 70 - 99 mg/dL    Comment: Glucose reference range applies only to samples taken after fasting for at least 8 hours.   BUN 23 (  H) 6 - 20 mg/dL   Creatinine, Ser 8.30 (H) 0.44 - 1.00 mg/dL   Calcium  8.6 (L) 8.9 - 10.3 mg/dL   Total Protein 6.5 6.5 - 8.1 g/dL   Albumin 3.7 3.5 - 5.0 g/dL   AST 51 (H) 15 - 41 U/L    Comment: HEMOLYSIS AT THIS LEVEL MAY AFFECT RESULT   ALT 36 0 - 44 U/L   Alkaline Phosphatase 168 (H) 38 - 126 U/L   Total Bilirubin 0.4 0.0 - 1.2 mg/dL   GFR, Estimated 35 (L) >60 mL/min    Comment: (NOTE) Calculated using the CKD-EPI Creatinine Equation (2021)    Anion gap 14 5 - 15    Comment: Performed at [facility], [address] [place], [place] [address]  Lipase, blood     Status: None   Collection Time: 01/25/24  8:33 PM  Result Value Ref Range   Lipase 14 11 - 51 U/L    Comment: Performed at [facility], [address] [place], [place] [address]  CBC with Differential     Status: Abnormal   Collection Time: 01/25/24  8:33 PM  Result Value Ref Range   WBC 17.1 (H) 4.0 - 10.5 K/uL   RBC 4.68 3.87 - 5.11 MIL/uL   Hemoglobin 14.6 12.0 - 15.0 g/dL   HCT 54.6 63.9 - 53.9 %   MCV 96.8 80.0 - 100.0 fL   MCH 31.2 26.0 - 34.0 pg   MCHC 32.2 30.0 - 36.0 g/dL   RDW 85.6  88.4 - 84.4 %   Platelets 426 (H) 150 - 400 K/uL   nRBC 0.0 0.0 - 0.2 %   Neutrophils Relative % 81 %   Neutro Abs 13.9 (H) 1.7 - 7.7 K/uL   Lymphocytes Relative 7 %   Lymphs Abs 1.1 0.7 - 4.0 K/uL   Monocytes Relative 11 %   Monocytes Absolute 1.8 (H) 0.1 - 1.0 K/uL   Eosinophils Relative 1 %   Eosinophils Absolute 0.1 0.0 - 0.5 K/uL   Basophils Relative 0 %   Basophils Absolute 0.1 0.0 - 0.1 K/uL   WBC Morphology MORPHOLOGY UNREMARKABLE    RBC Morphology MORPHOLOGY UNREMARKABLE    Smear Review Normal platelet morphology    Immature Granulocytes 0 %   Abs Immature Granulocytes 0.04 0.00 - 0.07 K/uL    Comment: Performed at [facility], [address] [place], [place] [address]  Lactic acid, plasma     Status: Abnormal   Collection Time: 01/25/24 10:27 PM  Result Value Ref Range   Lactic Acid, Venous 2.1 (HH) 0.5 - 1.9 mmol/L    Comment: Critical Value, Read Back and verified with ASHLEY SMITH @2250  ON 01/25/24 SKL Performed at [facility], [address] [place], [place] [address]   Culture, blood (Routine X 2) w Reflex to ID Panel     Status: None   Collection Time: 01/26/24  1:57 AM   Specimen: BLOOD  Result Value Ref Range   Specimen Description BLOOD BLOOD RIGHT HAND    Special Requests IN PEDIATRIC BOTTLE Blood Culture adequate volume    Culture      NO GROWTH 5 DAYS Performed at [facility], [address], [place], [place] [address]    Report Status 01/31/2024 FINAL   Culture, blood (Routine X 2) w Reflex to ID Panel     Status: None   Collection Time: 01/26/24  1:57 AM   Specimen: BLOOD  Result Value Ref  Range   Specimen Description BLOOD BLOOD LEFT HAND    Special Requests      BOTTLES DRAWN AEROBIC ONLY Blood Culture results may not be optimal due to an inadequate volume of blood received in culture bottles   Culture      NO GROWTH 5 DAYS Performed at [facility], [address] [place], [place] [address]     Report Status 01/31/2024 FINAL   Potassium     Status: None   Collection Time: 01/26/24  1:57 AM  Result Value Ref Range   Potassium 5.0 3.5 - 5.1 mmol/L    Comment: Performed at [facility], [address] [place], [place] [address]  Protime-INR     Status: None   Collection Time: 01/26/24  1:57 AM  Result Value Ref Range   Prothrombin Time 14.3 11.4 - 15.2 seconds   INR 1.1 0.8 - 1.2    Comment: (NOTE) INR goal varies based on device and disease states. Performed at [facility], [address] [place], [place] [address]   APTT     Status: None   Collection Time: 01/26/24  1:57 AM  Result Value Ref Range   aPTT 26 24 - 36 seconds    Comment: Performed at [facility], [address] [place], [place] [address]  Miscellaneous [facility] test (send-out)     Status: None   Collection Time: 01/26/24  1:57 AM  Result Value Ref Range   [facility] test code [id number]    [facility] test name [id number]     Comment: Performed at [facility], [address] [place], [place] [address]   Misc [facility] result COMMENT     Comment: (NOTE) Test Ordered: [id number] HIV Ab/p24 Ag with Reflex HIV Ab/p24 Ag Screen           Note:                     BN     Non Reactive                                                  Reference Range: Non Reactive                          HIV-1/HIV-2 antibodies and HIV-1 p24 antigen were NOT detected. There is no laboratory evidence of HIV infection. HIV Negative Performed At: [facility] [address] [place], [place] [id number] Jennette Shorter MD Ey:[id number]   Urinalysis, w/ Reflex to Culture (Infection Suspected) -Urine, Clean Catch     Status: Abnormal   Collection Time: 01/26/24  2:48 AM  Result Value Ref Range   Specimen Source URINE, RANDOM     Comment: CORRECTED ON 11/18 AT 0253: PREVIOUSLY REPORTED AS URINE, CLEAN CATCH   Color, Urine AMBER (A) YELLOW    Comment: BIOCHEMICALS MAY BE AFFECTED BY COLOR    APPearance HAZY (A) CLEAR   Specific Gravity, Urine >1.046 (H) 1.005 - 1.030   pH 5.0 5.0 - 8.0   Glucose, UA NEGATIVE NEGATIVE mg/dL   Hgb urine dipstick NEGATIVE NEGATIVE   Bilirubin Urine SMALL (A) NEGATIVE   Ketones, ur NEGATIVE NEGATIVE mg/dL   Protein, ur 30 (A) NEGATIVE mg/dL   Nitrite NEGATIVE NEGATIVE   Leukocytes,Ua NEGATIVE NEGATIVE   RBC /  HPF 6-10 0 - 5 RBC/hpf   WBC, UA 0-5 0 - 5 WBC/hpf    Comment:        Reflex urine culture not performed if WBC <=10, OR if Squamous epithelial cells >5. If Squamous epithelial cells >5 suggest recollection.    Bacteria, UA NONE SEEN NONE SEEN   Squamous Epithelial / HPF 11-20 0 - 5 /HPF   Mucus PRESENT    Hyaline Casts, UA PRESENT     Comment: Performed at [facility], [address] [place], [place] [address]  Urine Drug Screen     Status: None   Collection Time: 01/26/24  2:48 AM  Result Value Ref Range   Opiates NEGATIVE NEGATIVE   Cocaine NEGATIVE NEGATIVE   Benzodiazepines NEGATIVE NEGATIVE   Amphetamines NEGATIVE NEGATIVE   Tetrahydrocannabinol NEGATIVE NEGATIVE   Barbiturates NEGATIVE NEGATIVE   Methadone Scn, Ur NEGATIVE NEGATIVE   Fentanyl  NEGATIVE NEGATIVE    Comment: (NOTE) Drug screen is for Medical Purposes only. Positive results are preliminary only. If confirmation is needed, notify lab within 5 days.  Drug Class                 Cutoff (ng/mL) Amphetamine and metabolites 1000 Barbiturate and metabolites 200 Benzodiazepine              200 Opiates and metabolites     300 Cocaine and metabolites     300 THC                         50 Fentanyl                     5 Methadone                   300  Trazodone  is metabolized in vivo to several metabolites,  including pharmacologically active m-CPP, which is excreted in the  urine.  Immunoassay screens for amphetamines and MDMA have potential  cross-reactivity with these compounds and may provide false positive  result.  Performed at [facility], [address] [place], [place] [address]   Glucose, capillary     Status: Abnormal   Collection Time: 01/26/24  5:00 AM  Result Value Ref Range   Glucose-Capillary 142 (H) 70 - 99 mg/dL    Comment: Glucose reference range applies only to samples taken after fasting for at least 8 hours.  Basic metabolic panel     Status: Abnormal   Collection Time: 01/26/24  6:48 AM  Result Value Ref Range   Sodium 134 (L) 135 - 145 mmol/L   Potassium 5.2 (H) 3.5 - 5.1 mmol/L   Chloride 103 98 - 111 mmol/L   CO2 18 (L) 22 - 32 mmol/L   Glucose, Bld 154 (H) 70 - 99 mg/dL    Comment: Glucose reference range applies only to samples taken after fasting for at least 8 hours.   BUN 28 (H) 6 - 20 mg/dL   Creatinine, Ser 8.21 (H) 0.44 - 1.00 mg/dL   Calcium  7.9 (L) 8.9 - 10.3 mg/dL   GFR, Estimated 33 (L) >60 mL/min    Comment: (NOTE) Calculated using the CKD-EPI Creatinine Equation (2021)    Anion gap 13 5 - 15    Comment: Performed at [facility], [address], [place], [place] [address]  CBC     Status: None   Collection Time: 01/26/24  6:48 AM  Result Value Ref  Range   WBC 9.6 4.0 - 10.5 K/uL   RBC 4.43 3.87 - 5.11 MIL/uL   Hemoglobin 13.6 12.0 - 15.0 g/dL   HCT 57.4 63.9 - 53.9 %   MCV 95.9 80.0 - 100.0 fL   MCH 30.7 26.0 - 34.0 pg   MCHC 32.0 30.0 - 36.0 g/dL   RDW 85.5 88.4 - 84.4 %   Platelets 309 150 - 400 K/uL   nRBC 0.0 0.0 - 0.2 %    Comment: Performed at [facility], [address] [place], [place] [address]  Lactic acid, plasma     Status: Abnormal   Collection Time: 01/26/24  6:48 AM  Result Value Ref Range   Lactic Acid, Venous 2.0 (HH) 0.5 - 1.9 mmol/L    Comment: Critical value noted. Value is consistent with previously reported and called value DAS Performed at [facility], [address] [place], [place] [address]   Glucose, capillary     Status: Abnormal   Collection Time: 01/26/24  8:28 AM  Result Value Ref Range    Glucose-Capillary 146 (H) 70 - 99 mg/dL    Comment: Glucose reference range applies only to samples taken after fasting for at least 8 hours.   Comment 1 Notify RN    Comment 2 Document in Chart   Lactic acid, plasma     Status: None   Collection Time: 01/26/24  8:57 AM  Result Value Ref Range   Lactic Acid, Venous 1.9 0.5 - 1.9 mmol/L    Comment: Performed at [facility], [address] [place], [place] [address]  Glucose, capillary     Status: Abnormal   Collection Time: 01/26/24 11:44 AM  Result Value Ref Range   Glucose-Capillary 135 (H) 70 - 99 mg/dL    Comment: Glucose reference range applies only to samples taken after fasting for at least 8 hours.   Comment 1 Notify RN    Comment 2 Document in Chart   Glucose, capillary     Status: Abnormal   Collection Time: 01/26/24  4:48 PM  Result Value Ref Range   Glucose-Capillary 142 (H) 70 - 99 mg/dL    Comment: Glucose reference range applies only to samples taken after fasting for at least 8 hours.   Comment 1 Notify RN    Comment 2 Document in Chart   Glucose, capillary     Status: Abnormal   Collection Time: 01/26/24  8:49 PM  Result Value Ref Range   Glucose-Capillary 197 (H) 70 - 99 mg/dL    Comment: Glucose reference range applies only to samples taken after fasting for at least 8 hours.   Comment 1 Notify RN   C Difficile Quick Screen w PCR reflex     Status: None   Collection Time: 01/26/24 10:00 PM   Specimen: STOOL  Result Value Ref Range   C Diff antigen NEGATIVE NEGATIVE   C Diff toxin NEGATIVE NEGATIVE   C Diff interpretation No C. difficile detected.     Comment: Performed at [facility], [address] [place], [place] [address]  Gastrointestinal Panel by PCR , Stool     Status: None   Collection Time: 01/26/24 10:00 PM   Specimen: Stool  Result Value Ref Range   Campylobacter species NOT DETECTED NOT DETECTED   Plesimonas shigelloides NOT DETECTED NOT DETECTED   Salmonella species NOT  DETECTED NOT DETECTED   Yersinia enterocolitica NOT DETECTED NOT DETECTED   Vibrio species NOT DETECTED  NOT DETECTED   Vibrio cholerae NOT DETECTED NOT DETECTED   Enteroaggregative E coli (EAEC) NOT DETECTED NOT DETECTED   Enteropathogenic E coli (EPEC) NOT DETECTED NOT DETECTED   Enterotoxigenic E coli (ETEC) NOT DETECTED NOT DETECTED   Shiga like toxin producing E coli (STEC) NOT DETECTED NOT DETECTED   Shigella/Enteroinvasive E coli (EIEC) NOT DETECTED NOT DETECTED   Cryptosporidium NOT DETECTED NOT DETECTED   Cyclospora cayetanensis NOT DETECTED NOT DETECTED   Entamoeba histolytica NOT DETECTED NOT DETECTED   Giardia lamblia NOT DETECTED NOT DETECTED   Adenovirus F40/41 NOT DETECTED NOT DETECTED   Astrovirus NOT DETECTED NOT DETECTED   Norovirus GI/GII NOT DETECTED NOT DETECTED   Rotavirus A NOT DETECTED NOT DETECTED   Sapovirus (I, II, IV, and V) NOT DETECTED NOT DETECTED    Comment: Performed at [facility], [address] [place], [place] [address]  CBC     Status: Abnormal   Collection Time: 01/27/24  6:24 AM  Result Value Ref Range   WBC 11.5 (H) 4.0 - 10.5 K/uL   RBC 3.40 (L) 3.87 - 5.11 MIL/uL   Hemoglobin 10.7 (L) 12.0 - 15.0 g/dL   HCT 67.4 (L) 63.9 - 53.9 %   MCV 95.6 80.0 - 100.0 fL   MCH 31.5 26.0 - 34.0 pg   MCHC 32.9 30.0 - 36.0 g/dL   RDW 85.3 88.4 - 84.4 %   Platelets 266 150 - 400 K/uL   nRBC 0.0 0.0 - 0.2 %    Comment: Performed at [facility], [address] [place], [place] [address]  Renal function panel     Status: Abnormal   Collection Time: 01/27/24  6:24 AM  Result Value Ref Range   Sodium 131 (L) 135 - 145 mmol/L   Potassium 4.3 3.5 - 5.1 mmol/L   Chloride 101 98 - 111 mmol/L   CO2 21 (L) 22 - 32 mmol/L   Glucose, Bld 143 (H) 70 - 99 mg/dL    Comment: Glucose reference range applies only to samples taken after fasting for at least 8 hours.   BUN 30 (H) 6 - 20 mg/dL   Creatinine, Ser 8.46 (H) 0.44 - 1.00 mg/dL   Calcium   7.2 (L) 8.9 - 10.3 mg/dL   Phosphorus 2.6 2.5 - 4.6 mg/dL   Albumin 2.7 (L) 3.5 - 5.0 g/dL   GFR, Estimated 39 (L) >60 mL/min    Comment: (NOTE) Calculated using the CKD-EPI Creatinine Equation (2021)    Anion gap 9 5 - 15    Comment: Performed at [facility], [address] [place], [place] [address]  Magnesium      Status: None   Collection Time: 01/27/24  6:24 AM  Result Value Ref Range   Magnesium  1.8 1.7 - 2.4 mg/dL    Comment: Performed at [facility], [address] [place], [place] [address]  Calprotectin, Fecal     Status: Abnormal   Collection Time: 01/27/24 11:20 AM   Specimen: Stool  Result Value Ref Range   Calprotectin, Fecal 2,350 (H) 0 - 120 ug/g    Comment: (NOTE) **Results verified by repeat testing** Concentration     Interpretation   Follow-Up < 5 - 50 ug/g     Normal           None >50 -120 ug/g     Borderline       Re-evaluate in 4-6 weeks    >120 ug/g     Abnormal  Repeat as clinically                                   indicated Performed At: [facility] [address] [place], [place] [id number] Jennette Shorter MD Ey:[id number]   Glucose, capillary     Status: Abnormal   Collection Time: 01/27/24 11:51 AM  Result Value Ref Range   Glucose-Capillary 112 (H) 70 - 99 mg/dL    Comment: Glucose reference range applies only to samples taken after fasting for at least 8 hours.   Comment 1 Notify RN    Comment 2 Document in Chart   Glucose, capillary     Status: Abnormal   Collection Time: 01/27/24  5:42 PM  Result Value Ref Range   Glucose-Capillary 136 (H) 70 - 99 mg/dL    Comment: Glucose reference range applies only to samples taken after fasting for at least 8 hours.  Culture, blood (Routine X 2) w Reflex to ID Panel     Status: None   Collection Time: 01/27/24  7:19 PM   Specimen: BLOOD LEFT ARM  Result Value Ref Range   Specimen Description BLOOD LEFT ARM    Special Requests      BOTTLES DRAWN AEROBIC AND  ANAEROBIC Blood Culture adequate volume   Culture      NO GROWTH 5 DAYS Performed at [facility], [address] [place], [place] [address]    Report Status 02/01/2024 FINAL   Culture, blood (Routine X 2) w Reflex to ID Panel     Status: None   Collection Time: 01/27/24  7:30 PM   Specimen: BLOOD LEFT HAND  Result Value Ref Range   Specimen Description BLOOD LEFT HAND    Special Requests      BOTTLES DRAWN AEROBIC ONLY Blood Culture results may not be optimal due to an inadequate volume of blood received in culture bottles   Culture      NO GROWTH 5 DAYS Performed at [facility], [address], [place], [place] [address]    Report Status 02/01/2024 FINAL   Glucose, capillary     Status: Abnormal   Collection Time: 01/27/24  9:35 PM  Result Value Ref Range   Glucose-Capillary 104 (H) 70 - 99 mg/dL    Comment: Glucose reference range applies only to samples taken after fasting for at least 8 hours.  Glucose, capillary     Status: Abnormal   Collection Time: 01/28/24  3:35 AM  Result Value Ref Range   Glucose-Capillary 115 (H) 70 - 99 mg/dL    Comment: Glucose reference range applies only to samples taken after fasting for at least 8 hours.  Basic metabolic panel with GFR     Status: Abnormal   Collection Time: 01/28/24  5:54 AM  Result Value Ref Range   Sodium 133 (L) 135 - 145 mmol/L   Potassium 3.9 3.5 - 5.1 mmol/L   Chloride 104 98 - 111 mmol/L   CO2 22 22 - 32 mmol/L   Glucose, Bld 112 (H) 70 - 99 mg/dL    Comment: Glucose reference range applies only to samples taken after fasting for at least 8 hours.   BUN 22 (H) 6 - 20 mg/dL   Creatinine, Ser 8.95 (H) 0.44 - 1.00 mg/dL   Calcium  7.0 (L) 8.9 - 10.3 mg/dL   GFR, Estimated >39 >39 mL/min    Comment: (NOTE) Calculated using the  CKD-EPI Creatinine Equation (2021)    Anion gap 8 5 - 15    Comment: Performed at [facility], [address] [place], [place] [address]  CBC     Status:  Abnormal   Collection Time: 01/28/24  5:54 AM  Result Value Ref Range   WBC 12.4 (H) 4.0 - 10.5 K/uL   RBC 2.89 (L) 3.87 - 5.11 MIL/uL   Hemoglobin 9.0 (L) 12.0 - 15.0 g/dL   HCT 73.3 (L) 63.9 - 53.9 %   MCV 92.0 80.0 - 100.0 fL   MCH 31.1 26.0 - 34.0 pg   MCHC 33.8 30.0 - 36.0 g/dL   RDW 85.3 88.4 - 84.4 %   Platelets 224 150 - 400 K/uL   nRBC 0.0 0.0 - 0.2 %    Comment: Performed at [facility], [address], [place], [place] [address]  Magnesium      Status: None   Collection Time: 01/28/24  5:54 AM  Result Value Ref Range   Magnesium  2.2 1.7 - 2.4 mg/dL    Comment: Performed at [facility], [address], [place], [place] [address]  Phosphorus     Status: Abnormal   Collection Time: 01/28/24  5:54 AM  Result Value Ref Range   Phosphorus 2.1 (L) 2.5 - 4.6 mg/dL    Comment: Performed at [facility], [address] [place], [place] [address]  CK     Status: None   Collection Time: 01/28/24  5:54 AM  Result Value Ref Range   Total CK 198 38 - 234 U/L    Comment: Performed at [facility], [address] [place], [place] [address]  Glucose, capillary     Status: Abnormal   Collection Time: 01/28/24  9:37 AM  Result Value Ref Range   Glucose-Capillary 109 (H) 70 - 99 mg/dL    Comment: Glucose reference range applies only to samples taken after fasting for at least 8 hours.   Comment 1 Notify RN    Comment 2 Document in Chart   Ammonia     Status: None   Collection Time: 01/28/24  9:38 AM  Result Value Ref Range   Ammonia 23 9 - 35 umol/L    Comment: Performed at [facility], [address] [place], [place] [address]  Blood gas, venous     Status: None   Collection Time: 01/28/24  9:38 AM  Result Value Ref Range   pH, Ven 7.34 7.25 - 7.43   pCO2, Ven 45 44 - 60 mmHg   pO2, Ven 38 32 - 45 mmHg   Bicarbonate 24.3 20.0 - 28.0 mmol/L   Acid-base deficit 1.7 0.0 - 2.0 mmol/L   O2 Saturation 64.4 %   Patient  temperature 37.0    Collection site VEIN     Comment: Performed at [facility], [address] [place], [place] [address]  Glucose, capillary     Status: Abnormal   Collection Time: 01/28/24 12:55 PM  Result Value Ref Range   Glucose-Capillary 107 (H) 70 - 99 mg/dL    Comment: Glucose reference range applies only to samples taken after fasting for at least 8 hours.  Glucose, capillary     Status: None   Collection Time: 01/28/24  5:52 PM  Result Value Ref Range   Glucose-Capillary 82 70 - 99 mg/dL    Comment: Glucose reference range applies only to samples taken after fasting for at least 8 hours.  Glucose, capillary  Status: None   Collection Time: 01/28/24  8:59 PM  Result Value Ref Range   Glucose-Capillary 78 70 - 99 mg/dL    Comment: Glucose reference range applies only to samples taken after fasting for at least 8 hours.  Glucose, capillary     Status: None   Collection Time: 01/29/24  3:38 AM  Result Value Ref Range   Glucose-Capillary 76 70 - 99 mg/dL    Comment: Glucose reference range applies only to samples taken after fasting for at least 8 hours.   Comment 1 Document in Chart   Basic metabolic panel with GFR     Status: Abnormal   Collection Time: 01/29/24  4:40 AM  Result Value Ref Range   Sodium 134 (L) 135 - 145 mmol/L   Potassium 4.0 3.5 - 5.1 mmol/L   Chloride 105 98 - 111 mmol/L   CO2 17 (L) 22 - 32 mmol/L   Glucose, Bld 105 (H) 70 - 99 mg/dL    Comment: Glucose reference range applies only to samples taken after fasting for at least 8 hours.   BUN 12 6 - 20 mg/dL   Creatinine, Ser 9.09 0.44 - 1.00 mg/dL   Calcium  7.5 (L) 8.9 - 10.3 mg/dL   GFR, Estimated >39 >39 mL/min    Comment: (NOTE) Calculated using the CKD-EPI Creatinine Equation (2021)    Anion gap 12 5 - 15    Comment: Performed at [facility], [address] [place], [place] [address]  CBC     Status: Abnormal   Collection Time: 01/29/24  4:40 AM  Result Value Ref  Range   WBC 12.4 (H) 4.0 - 10.5 K/uL   RBC 3.04 (L) 3.87 - 5.11 MIL/uL   Hemoglobin 9.5 (L) 12.0 - 15.0 g/dL   HCT 72.0 (L) 63.9 - 53.9 %   MCV 91.8 80.0 - 100.0 fL   MCH 31.3 26.0 - 34.0 pg   MCHC 34.1 30.0 - 36.0 g/dL   RDW 85.2 88.4 - 84.4 %   Platelets 262 150 - 400 K/uL   nRBC 0.2 0.0 - 0.2 %    Comment: Performed at [facility], [address], [place], [place] [address]  Magnesium      Status: None   Collection Time: 01/29/24  4:40 AM  Result Value Ref Range   Magnesium  2.3 1.7 - 2.4 mg/dL    Comment: Performed at [facility], [address], [place], [place] [address]  Phosphorus     Status: Abnormal   Collection Time: 01/29/24  4:40 AM  Result Value Ref Range   Phosphorus 2.3 (L) 2.5 - 4.6 mg/dL    Comment: Performed at [facility], [address] [place], [place] [address]  CK     Status: Abnormal   Collection Time: 01/29/24  4:40 AM  Result Value Ref Range   Total CK 240 (H) 38 - 234 U/L    Comment: Performed at [facility], [address] [place], [place] [address]  Glucose, capillary     Status: None   Collection Time: 01/29/24  8:30 AM  Result Value Ref Range   Glucose-Capillary 85 70 - 99 mg/dL    Comment: Glucose reference range applies only to samples taken after fasting for at least 8 hours.   Comment 1 Notify RN    Comment 2 Document in Chart   Glucose, capillary     Status: Abnormal   Collection Time: 01/29/24 11:30 AM  Result Value  Ref Range   Glucose-Capillary 114 (H) 70 - 99 mg/dL    Comment: Glucose reference range applies only to samples taken after fasting for at least 8 hours.   Comment 1 Notify RN    Comment 2 Document in Chart   Glucose, capillary     Status: None   Collection Time: 01/29/24  5:15 PM  Result Value Ref Range   Glucose-Capillary 80 70 - 99 mg/dL    Comment: Glucose reference range applies only to samples taken after fasting for at least 8 hours.   Comment 1 Notify RN    Comment 2  Document in Chart   Glucose, capillary     Status: Abnormal   Collection Time: 01/29/24  9:31 PM  Result Value Ref Range   Glucose-Capillary 123 (H) 70 - 99 mg/dL    Comment: Glucose reference range applies only to samples taken after fasting for at least 8 hours.  Basic metabolic panel with GFR     Status: Abnormal   Collection Time: 01/30/24  4:35 AM  Result Value Ref Range   Sodium 138 135 - 145 mmol/L   Potassium 3.6 3.5 - 5.1 mmol/L   Chloride 101 98 - 111 mmol/L   CO2 23 22 - 32 mmol/L   Glucose, Bld 111 (H) 70 - 99 mg/dL    Comment: Glucose reference range applies only to samples taken after fasting for at least 8 hours.   BUN 11 6 - 20 mg/dL   Creatinine, Ser 9.06 0.44 - 1.00 mg/dL   Calcium  7.8 (L) 8.9 - 10.3 mg/dL   GFR, Estimated >39 >39 mL/min    Comment: (NOTE) Calculated using the CKD-EPI Creatinine Equation (2021)    Anion gap 14 5 - 15    Comment: Performed at [facility], [address] [place], [place] [address]  CBC     Status: Abnormal   Collection Time: 01/30/24  4:35 AM  Result Value Ref Range   WBC 15.5 (H) 4.0 - 10.5 K/uL   RBC 3.21 (L) 3.87 - 5.11 MIL/uL   Hemoglobin 9.9 (L) 12.0 - 15.0 g/dL   HCT 70.9 (L) 63.9 - 53.9 %   MCV 90.3 80.0 - 100.0 fL   MCH 30.8 26.0 - 34.0 pg   MCHC 34.1 30.0 - 36.0 g/dL   RDW 85.0 88.4 - 84.4 %   Platelets 307 150 - 400 K/uL   nRBC 0.2 0.0 - 0.2 %    Comment: Performed at [facility], [address] [place], [place] [address]  Magnesium      Status: None   Collection Time: 01/30/24  4:35 AM  Result Value Ref Range   Magnesium  2.3 1.7 - 2.4 mg/dL    Comment: Performed at [facility], [address], [place], [place] [address]  Phosphorus     Status: None   Collection Time: 01/30/24  4:35 AM  Result Value Ref Range   Phosphorus 4.2 2.5 - 4.6 mg/dL    Comment: Performed at [facility], [address] [place], [place] [address]  C-reactive protein     Status: Abnormal    Collection Time: 01/30/24  4:35 AM  Result Value Ref Range   CRP 21.0 (H) <1.0 mg/dL    Comment: Performed at [facility], [address], [place], [place] [address]  Glucose, capillary     Status: None   Collection Time: 01/30/24  8:17 AM  Result Value Ref Range   Glucose-Capillary 84  70 - 99 mg/dL    Comment: Glucose reference range applies only to samples taken after fasting for at least 8 hours.   Comment 1 Notify RN    Comment 2 Document in Chart   Glucose, capillary     Status: Abnormal   Collection Time: 01/30/24 11:45 AM  Result Value Ref Range   Glucose-Capillary 122 (H) 70 - 99 mg/dL    Comment: Glucose reference range applies only to samples taken after fasting for at least 8 hours.   Comment 1 Notify RN    Comment 2 Document in Chart   Glucose, capillary     Status: Abnormal   Collection Time: 01/30/24  4:29 PM  Result Value Ref Range   Glucose-Capillary 129 (H) 70 - 99 mg/dL    Comment: Glucose reference range applies only to samples taken after fasting for at least 8 hours.   Comment 1 Notify RN    Comment 2 Document in Chart   Glucose, capillary     Status: Abnormal   Collection Time: 01/30/24  9:20 PM  Result Value Ref Range   Glucose-Capillary 138 (H) 70 - 99 mg/dL    Comment: Glucose reference range applies only to samples taken after fasting for at least 8 hours.  Glucose, capillary     Status: Abnormal   Collection Time: 01/30/24  9:54 PM  Result Value Ref Range   Glucose-Capillary 125 (H) 70 - 99 mg/dL    Comment: Glucose reference range applies only to samples taken after fasting for at least 8 hours.  Surgical pathology     Status: None   Collection Time: 01/31/24 12:00 AM  Result Value Ref Range   SURGICAL PATHOLOGY      SURGICAL PATHOLOGY [facility] [address] [place], [place] [address] Telephone [phone] or [phone] Fax [phone]  REPORT OF SURGICAL PATHOLOGY   Accession #:  [id number] Patient Name: BRIGITTA, PRICER Visit # : [id number]  MRN: [id number] Physician: Maryruth Smalls DOB/Age [birth year] (Age: 25) Gender: F Collected Date: 01/31/2024 Received Date: 02/01/2024  FINAL DIAGNOSIS       1. Transverse Colon Biopsy, cbx :       - BENIGN COLONIC MUCOSA WITH ULCERATION AND MUCOSAL NECROSIS.      - BACKGROUND MUCOSA WITH NO SPECIFIC PATHOLOGIC CHANGE.      - DEEPER LEVELS EXAMINED.       DATE SIGNED OUT: 02/03/2024 ELECTRONIC SIGNATURE : Coronel Md, Misti, Sports Administrator, International Aid/development Worker  MICROSCOPIC DESCRIPTION  CASE COMMENTS STAINS USED IN DIAGNOSIS: H&E *RECUT DEEPER X 3 LEVELS *RECUT DEEPER X 3 LEVELS    CLINICAL HISTORY  SPECIMEN(S) OBTAINED 1. Transverse Colon Biopsy, Cbx  SPECIMEN COMMENTS: 1. u lcerations SPECIMEN CLINICAL INFORMATION: 1. Abnormal imaging, ulcerations    Gross Description 1. Received in formalin are tan, soft tissue fragments that are submitted in toto.  Number:  6 Size:  0.3 cm,  1 block. 3 pieces are likely debris. mb 02-01-24        Report signed out from the following location(s) [place] [address] [place], [place] [address] CLIA #: [id number]  [facility] [address] [place], [place] [address] CLIA #: [id number]   Basic metabolic panel with GFR     Status: Abnormal   Collection Time: 01/31/24  6:51 AM  Result Value Ref Range   Sodium 133 (L) 135 - 145 mmol/L   Potassium 4.1 3.5 - 5.1 mmol/L  Chloride 103 98 - 111 mmol/L   CO2 21 (L) 22 - 32 mmol/L   Glucose, Bld 109 (H) 70 - 99 mg/dL    Comment: Glucose reference range applies only to samples taken after fasting for at least 8 hours.   BUN 9 6 - 20 mg/dL   Creatinine, Ser 9.24 0.44 - 1.00 mg/dL   Calcium  7.7 (L) 8.9 - 10.3 mg/dL   GFR, Estimated >39 >39 mL/min    Comment: (NOTE) Calculated using the CKD-EPI Creatinine Equation (2021)    Anion gap 8 5 - 15    Comment: Performed  at [facility], [address] [place], [place] [address]  CBC     Status: Abnormal   Collection Time: 01/31/24  6:51 AM  Result Value Ref Range   WBC 19.7 (H) 4.0 - 10.5 K/uL   RBC 2.72 (L) 3.87 - 5.11 MIL/uL   Hemoglobin 8.5 (L) 12.0 - 15.0 g/dL   HCT 75.4 (L) 63.9 - 53.9 %   MCV 90.1 80.0 - 100.0 fL   MCH 31.3 26.0 - 34.0 pg   MCHC 34.7 30.0 - 36.0 g/dL   RDW 84.9 88.4 - 84.4 %   Platelets 310 150 - 400 K/uL   nRBC 0.2 0.0 - 0.2 %    Comment: Performed at [facility], [address], [place], [place] [address]  Magnesium      Status: None   Collection Time: 01/31/24  6:51 AM  Result Value Ref Range   Magnesium  2.1 1.7 - 2.4 mg/dL    Comment: Performed at [facility], [address] [place], [place] [address]  Phosphorus     Status: None   Collection Time: 01/31/24  6:51 AM  Result Value Ref Range   Phosphorus 2.5 2.5 - 4.6 mg/dL    Comment: Performed at [facility], [address] [place], [place] [address]  Glucose, capillary     Status: Abnormal   Collection Time: 01/31/24  9:02 AM  Result Value Ref Range   Glucose-Capillary 127 (H) 70 - 99 mg/dL    Comment: Glucose reference range applies only to samples taken after fasting for at least 8 hours.  Procalcitonin     Status: None   Collection Time: 01/31/24 10:23 AM  Result Value Ref Range   Procalcitonin 0.48 ng/mL    Comment: (NOTE)   Sepsis PCT Algorithm          Lower Respiratory Tract Infection                                         PCT Algorithm -----------------------------------------------------------------  <0.5 ng/mL                    <0.10 ng/mL  Associated with low           Antibiotic therapy strongly   risk for progression          discouraged. Indicates absence   to severe sepsis              of bacteria infection  and/or septic shock             --------------------------------------------------------------  0.5-2.0 ng/mL                 0.10-0.25 ng/mL   Recommended to retest         Antibiotic therapy discouraged.  PCT within 6-24 hours         Bacterial infection unlikely  ------------------------------------------------------------  >2 ng/mL                      0.26-0.50 ng/mL  Associated with high risk     Antibiotic therapy encouraged.  for progression to severe     Bacterial infection possible  sepsis/and or septic shock    ------------------------------                                 >0.50 ng/mL                                Antibiotic therapy strongly                                 encouraged.                                Suggestive of presence of                                 bacterial infection.                                 -------------------------------------------------------------------  < or = 0.50 ng/mL OR          < or = 0.25 OR 80% decrease in PCT  80% decrease in PCT           Antibiotic therapy   Antibiotic therapy may        may be discontinued  be discontinued                                 Performed at [facility], [address] [place], [place] [address]   Glucose, capillary     Status: None   Collection Time: 01/31/24 11:39 AM  Result Value Ref Range   Glucose-Capillary 97 70 - 99 mg/dL    Comment: Glucose reference range applies only to samples taken after fasting for at least 8 hours.  Glucose, capillary     Status: Abnormal   Collection Time: 01/31/24  3:26 PM  Result Value Ref Range   Glucose-Capillary 190 (H) 70 - 99 mg/dL    Comment: Glucose reference range applies only to samples taken after fasting for at least 8 hours.  Glucose, capillary     Status: Abnormal   Collection Time: 01/31/24  9:13 PM  Result Value Ref Range   Glucose-Capillary 128 (H) 70 - 99 mg/dL    Comment: Glucose reference range applies only to samples taken after fasting for at least 8 hours.  Basic metabolic panel with GFR     Status: Abnormal   Collection Time: 02/01/24  5:52 AM  Result Value Ref  Range   Sodium 134 (L) 135 - 145 mmol/L   Potassium 3.5 3.5 - 5.1 mmol/L   Chloride 106 98 - 111 mmol/L   CO2 19 (L) 22 - 32 mmol/L   Glucose, Bld 96 70 - 99  mg/dL    Comment: Glucose reference range applies only to samples taken after fasting for at least 8 hours.   BUN 10 6 - 20 mg/dL   Creatinine, Ser 9.29 0.44 - 1.00 mg/dL   Calcium  7.9 (L) 8.9 - 10.3 mg/dL   GFR, Estimated >39 >39 mL/min    Comment: (NOTE) Calculated using the CKD-EPI Creatinine Equation (2021)    Anion gap 9 5 - 15    Comment: Performed at [facility], [address] [place], [place] [address]  CBC     Status: Abnormal   Collection Time: 02/01/24  5:52 AM  Result Value Ref Range   WBC 18.1 (H) 4.0 - 10.5 K/uL   RBC 2.94 (L) 3.87 - 5.11 MIL/uL   Hemoglobin 9.1 (L) 12.0 - 15.0 g/dL   HCT 73.0 (L) 63.9 - 53.9 %   MCV 91.5 80.0 - 100.0 fL   MCH 31.0 26.0 - 34.0 pg   MCHC 33.8 30.0 - 36.0 g/dL   RDW 84.3 (H) 88.4 - 84.4 %   Platelets 295 150 - 400 K/uL   nRBC 0.3 (H) 0.0 - 0.2 %    Comment: Performed at [facility], [address] [place], [place] [address]  Magnesium      Status: None   Collection Time: 02/01/24  5:52 AM  Result Value Ref Range   Magnesium  2.1 1.7 - 2.4 mg/dL    Comment: Performed at [facility], [address], [place], [place] [address]  Phosphorus     Status: None   Collection Time: 02/01/24  5:52 AM  Result Value Ref Range   Phosphorus 2.8 2.5 - 4.6 mg/dL    Comment: Performed at [facility], [address] [place], [place] [address]  Glucose, capillary     Status: Abnormal   Collection Time: 02/01/24  8:40 AM  Result Value Ref Range   Glucose-Capillary 107 (H) 70 - 99 mg/dL    Comment: Glucose reference range applies only to samples taken after fasting for at least 8 hours.   Comment 1 Notify RN    Comment 2 Document in Chart   Glucose, capillary     Status: Abnormal   Collection Time: 02/01/24 12:03 PM  Result Value Ref Range    Glucose-Capillary 108 (H) 70 - 99 mg/dL    Comment: Glucose reference range applies only to samples taken after fasting for at least 8 hours.  Glucose, capillary     Status: Abnormal   Collection Time: 02/01/24  9:46 PM  Result Value Ref Range   Glucose-Capillary 117 (H) 70 - 99 mg/dL    Comment: Glucose reference range applies only to samples taken after fasting for at least 8 hours.  Glucose, capillary     Status: Abnormal   Collection Time: 02/02/24  7:54 AM  Result Value Ref Range   Glucose-Capillary 101 (H) 70 - 99 mg/dL    Comment: Glucose reference range applies only to samples taken after fasting for at least 8 hours.   Comment 1 Notify RN    Comment 2 Document in Chart   Basic metabolic panel with GFR     Status: Abnormal   Collection Time: 02/02/24 10:49 AM  Result Value Ref Range   Sodium 139 135 - 145 mmol/L   Potassium 3.8 3.5 - 5.1 mmol/L   Chloride 108 98 - 111 mmol/L   CO2 19 (L) 22 - 32 mmol/L   Glucose, Bld 134 (H) 70 - 99 mg/dL  Comment: Glucose reference range applies only to samples taken after fasting for at least 8 hours.   BUN 9 6 - 20 mg/dL   Creatinine, Ser 9.30 0.44 - 1.00 mg/dL   Calcium  8.0 (L) 8.9 - 10.3 mg/dL   GFR, Estimated >39 >39 mL/min    Comment: (NOTE) Calculated using the CKD-EPI Creatinine Equation (2021)    Anion gap 12 5 - 15    Comment: Performed at [facility], [address] [place], [place] [address]  CBC     Status: Abnormal   Collection Time: 02/02/24 10:49 AM  Result Value Ref Range   WBC 13.9 (H) 4.0 - 10.5 K/uL   RBC 2.85 (L) 3.87 - 5.11 MIL/uL   Hemoglobin 8.8 (L) 12.0 - 15.0 g/dL   HCT 73.8 (L) 63.9 - 53.9 %   MCV 91.6 80.0 - 100.0 fL   MCH 30.9 26.0 - 34.0 pg   MCHC 33.7 30.0 - 36.0 g/dL   RDW 84.2 (H) 88.4 - 84.4 %   Platelets 490 (H) 150 - 400 K/uL   nRBC 0.3 (H) 0.0 - 0.2 %    Comment: Performed at [facility], [address] [place], [place] [address]  Magnesium      Status: None    Collection Time: 02/02/24 10:49 AM  Result Value Ref Range   Magnesium  2.0 1.7 - 2.4 mg/dL    Comment: Performed at [facility], [address], [place], [place] [address]  Phosphorus     Status: None   Collection Time: 02/02/24 10:49 AM  Result Value Ref Range   Phosphorus 3.1 2.5 - 4.6 mg/dL    Comment: Performed at [facility], [address] [place], [place] [address]  Glucose, capillary     Status: Abnormal   Collection Time: 02/02/24 12:26 PM  Result Value Ref Range   Glucose-Capillary 121 (H) 70 - 99 mg/dL    Comment: Glucose reference range applies only to samples taken after fasting for at least 8 hours.   Comment 1 Notify RN    Comment 2 Document in Chart   Glucose, capillary     Status: None   Collection Time: 02/02/24  4:06 PM  Result Value Ref Range   Glucose-Capillary 92 70 - 99 mg/dL    Comment: Glucose reference range applies only to samples taken after fasting for at least 8 hours.   Comment 1 Notify RN    Comment 2 Document in Chart   Glucose, capillary     Status: Abnormal   Collection Time: 02/02/24  9:42 PM  Result Value Ref Range   Glucose-Capillary 100 (H) 70 - 99 mg/dL    Comment: Glucose reference range applies only to samples taken after fasting for at least 8 hours.  CBC     Status: Abnormal   Collection Time: 02/03/24  1:53 AM  Result Value Ref Range   WBC 19.1 (H) 4.0 - 10.5 K/uL   RBC 2.88 (L) 3.87 - 5.11 MIL/uL   Hemoglobin 9.0 (L) 12.0 - 15.0 g/dL   HCT 73.3 (L) 63.9 - 53.9 %   MCV 92.4 80.0 - 100.0 fL   MCH 31.3 26.0 - 34.0 pg   MCHC 33.8 30.0 - 36.0 g/dL   RDW 84.1 (H) 88.4 - 84.4 %   Platelets 548 (H) 150 - 400 K/uL   nRBC 0.2 0.0 - 0.2 %    Comment: Performed at [facility], [address], [place], [place] [address]  Basic metabolic panel with GFR     Status: Abnormal   Collection Time: 02/03/24  1:53 AM  Result Value Ref Range   Sodium 138 135 - 145 mmol/L   Potassium 4.2 3.5 - 5.1 mmol/L   Chloride  110 98 - 111 mmol/L   CO2 19 (L) 22 - 32 mmol/L   Glucose, Bld 128 (H) 70 - 99 mg/dL    Comment: Glucose reference range applies only to samples taken after fasting for at least 8 hours.   BUN 9 6 - 20 mg/dL   Creatinine, Ser 9.34 0.44 - 1.00 mg/dL   Calcium  8.2 (L) 8.9 - 10.3 mg/dL   GFR, Estimated >39 >39 mL/min    Comment: (NOTE) Calculated using the CKD-EPI Creatinine Equation (2021)    Anion gap 10 5 - 15    Comment: Performed at [facility], [address] [place], [place] [address]  Phosphorus     Status: None   Collection Time: 02/03/24  1:53 AM  Result Value Ref Range   Phosphorus 3.2 2.5 - 4.6 mg/dL    Comment: Performed at [facility], [address] [place], [place] [address]  Magnesium      Status: None   Collection Time: 02/03/24  1:53 AM  Result Value Ref Range   Magnesium  2.1 1.7 - 2.4 mg/dL    Comment: Performed at [facility], [address] [place], [place] [address]  Glucose, capillary     Status: Abnormal   Collection Time: 02/03/24  8:58 AM  Result Value Ref Range   Glucose-Capillary 145 (H) 70 - 99 mg/dL    Comment: Glucose reference range applies only to samples taken after fasting for at least 8 hours.  Glucose, capillary     Status: Abnormal   Collection Time: 02/03/24 11:56 AM  Result Value Ref Range   Glucose-Capillary 109 (H) 70 - 99 mg/dL    Comment: Glucose reference range applies only to samples taken after fasting for at least 8 hours.  Glucose, capillary     Status: None   Collection Time: 02/03/24  4:22 PM  Result Value Ref Range   Glucose-Capillary 88 70 - 99 mg/dL    Comment: Glucose reference range applies only to samples taken after fasting for at least 8 hours.  Glucose, capillary     Status: Abnormal   Collection Time: 02/03/24  9:25 PM  Result Value Ref Range   Glucose-Capillary 148 (H) 70 - 99 mg/dL    Comment: Glucose reference range applies only to samples taken after fasting for at least 8  hours.  Basic metabolic panel     Status: Abnormal   Collection Time: 02/04/24  5:03 AM  Result Value Ref Range   Sodium 141 135 - 145 mmol/L   Potassium 3.9 3.5 - 5.1 mmol/L   Chloride 108 98 - 111 mmol/L   CO2 21 (L) 22 - 32 mmol/L   Glucose, Bld 102 (H) 70 - 99 mg/dL    Comment: Glucose reference range applies only to samples taken after fasting for at least 8 hours.   BUN 8 6 - 20 mg/dL   Creatinine, Ser 9.28 0.44 - 1.00 mg/dL   Calcium  8.2 (L) 8.9 - 10.3 mg/dL   GFR, Estimated >39 >39 mL/min    Comment: (NOTE) Calculated using the CKD-EPI Creatinine Equation (2021)    Anion gap 12 5 - 15    Comment: Performed at [facility], [address], [place], [place]  [address]  CBC     Status: Abnormal   Collection Time: 02/04/24  5:03 AM  Result Value Ref Range   WBC 8.4 4.0 - 10.5 K/uL   RBC 2.97 (L) 3.87 - 5.11 MIL/uL   Hemoglobin 9.2 (L) 12.0 - 15.0 g/dL   HCT 72.0 (L) 63.9 - 53.9 %   MCV 93.9 80.0 - 100.0 fL   MCH 31.0 26.0 - 34.0 pg   MCHC 33.0 30.0 - 36.0 g/dL   RDW 83.9 (H) 88.4 - 84.4 %   Platelets 654 (H) 150 - 400 K/uL   nRBC 0.2 0.0 - 0.2 %    Comment: Performed at [facility], [address] [place], [place] [address]  Glucose, capillary     Status: Abnormal   Collection Time: 02/04/24  7:55 AM  Result Value Ref Range   Glucose-Capillary 118 (H) 70 - 99 mg/dL    Comment: Glucose reference range applies only to samples taken after fasting for at least 8 hours.   Comment 1 Notify RN    Comment 2 Document in Chart   Glucose, capillary     Status: Abnormal   Collection Time: 02/04/24 11:40 AM  Result Value Ref Range   Glucose-Capillary 122 (H) 70 - 99 mg/dL    Comment: Glucose reference range applies only to samples taken after fasting for at least 8 hours.   Comment 1 Notify RN    Comment 2 Document in Chart   Glucose, capillary     Status: None   Collection Time: 02/04/24  4:31 PM  Result Value Ref Range   Glucose-Capillary 99 70 - 99  mg/dL    Comment: Glucose reference range applies only to samples taken after fasting for at least 8 hours.   Comment 1 Notify RN    Comment 2 Document in Chart   Glucose, capillary     Status: Abnormal   Collection Time: 02/04/24  8:34 PM  Result Value Ref Range   Glucose-Capillary 114 (H) 70 - 99 mg/dL    Comment: Glucose reference range applies only to samples taken after fasting for at least 8 hours.  Basic metabolic panel     Status: Abnormal   Collection Time: 02/05/24  7:59 AM  Result Value Ref Range   Sodium 138 135 - 145 mmol/L   Potassium 5.2 (H) 3.5 - 5.1 mmol/L    Comment: HEMOLYSIS AT THIS LEVEL MAY AFFECT RESULT   Chloride 109 98 - 111 mmol/L   CO2 18 (L) 22 - 32 mmol/L   Glucose, Bld 105 (H) 70 - 99 mg/dL    Comment: Glucose reference range applies only to samples taken after fasting for at least 8 hours.   BUN 7 6 - 20 mg/dL   Creatinine, Ser 9.35 0.44 - 1.00 mg/dL   Calcium  8.1 (L) 8.9 - 10.3 mg/dL   GFR, Estimated >39 >39 mL/min    Comment: (NOTE) Calculated using the CKD-EPI Creatinine Equation (2021)    Anion gap 11 5 - 15    Comment: Performed at [facility], [address] [place], [place] [address]  Glucose, capillary     Status: Abnormal   Collection Time: 02/05/24  8:30 AM  Result Value Ref Range   Glucose-Capillary 103 (H) 70 - 99 mg/dL    Comment: Glucose reference range applies only to samples taken after fasting for at least 8 hours.   Comment 1 Notify RN    Comment 2 Document in Chart  Glucose, capillary     Status: None   Collection Time: 02/05/24 12:12 PM  Result Value Ref Range   Glucose-Capillary 89 70 - 99 mg/dL    Comment: Glucose reference range applies only to samples taken after fasting for at least 8 hours.   Comment 1 Notify RN    Comment 2 Document in Chart   Lipase, blood     Status: None   Collection Time: 02/10/24 12:53 PM  Result Value Ref Range   Lipase 19 11 - 51 U/L    Comment: Performed at [facility], [address] [place], [place] [address]  Comprehensive metabolic panel     Status: Abnormal   Collection Time: 02/10/24 12:53 PM  Result Value Ref Range   Sodium 132 (L) 135 - 145 mmol/L   Potassium 3.6 3.5 - 5.1 mmol/L   Chloride 101 98 - 111 mmol/L   CO2 17 (L) 22 - 32 mmol/L   Glucose, Bld 155 (H) 70 - 99 mg/dL    Comment: Glucose reference range applies only to samples taken after fasting for at least 8 hours.   BUN 9 6 - 20 mg/dL   Creatinine, Ser 8.98 (H) 0.44 - 1.00 mg/dL   Calcium  8.2 (L) 8.9 - 10.3 mg/dL   Total Protein 7.1 6.5 - 8.1 g/dL   Albumin 3.4 (L) 3.5 - 5.0 g/dL   AST 24 15 - 41 U/L   ALT 13 0 - 44 U/L   Alkaline Phosphatase 270 (H) 38 - 126 U/L   Total Bilirubin 0.4 0.0 - 1.2 mg/dL   GFR, Estimated >39 >39 mL/min    Comment: (NOTE) Calculated using the CKD-EPI Creatinine Equation (2021)    Anion gap 14 5 - 15    Comment: Performed at [facility], [address] [place], [place] [address]  CBC     Status: Abnormal   Collection Time: 02/10/24 12:53 PM  Result Value Ref Range   WBC 6.8 4.0 - 10.5 K/uL   RBC 3.63 (L) 3.87 - 5.11 MIL/uL   Hemoglobin 11.3 (L) 12.0 - 15.0 g/dL   HCT 65.3 (L) 63.9 - 53.9 %   MCV 95.3 80.0 - 100.0 fL   MCH 31.1 26.0 - 34.0 pg   MCHC 32.7 30.0 - 36.0 g/dL   RDW 82.6 (H) 88.4 - 84.4 %   Platelets 1,077 (HH) 150 - 400 K/uL    Comment: This critical result has been called to AMBER PAYNE by Vashti Diego on 02/10/2024 14:15:17, and has been read back.   nRBC 0.4 (H) 0.0 - 0.2 %    Comment: Performed at [facility], [address] [place], [place] [address]       Assessment & Plan Acute colitis Diabetes mellitus with complication, with long-term current use of insulin  [facility]) - Advised patient to go to local ED for further evaluation. - FU within 7 days    No follow-ups on file.   Total time spent: 20 minutes  Oddis DELENA Cain, FNP  02/10/2024   This document may have been prepared by  [facility] Voice Recognition software and as such may include unintentional dictation errors.

## 2024-02-10 NOTE — ED Provider Notes (Signed)
 [facility] Provider Note    Event Date/Time   First MD Initiated Contact with Patient 02/10/24 1500     (approximate)   History   Abdominal Pain and Fever   HPI  Diana Quinn is a 58 y.o. female presents to the emergency department today because of concerns for abdominal pain, weakness and fever.  Patient was discharged from the hospital 5 days ago she had had a roughly 10-day stay secondary to colitis.  She states that when she left the hospital she continued to feel weak.  She would have intermittent abdominal discomfort.  Last night she says that her temperature got up to 103.6.  She did take Tylenol  which appeared to help improve her symptoms.  She had a follow-up appoint with her doctor today.  Given continued abdominal pain and fevers Dr. Recommended ER eval.     Physical Exam   Triage Vital Signs: ED Triage Vitals  Encounter Vitals Group     BP 02/10/24 1238 107/76     Girls Systolic BP Percentile --      Girls Diastolic BP Percentile --      Boys Systolic BP Percentile --      Boys Diastolic BP Percentile --      Pulse Rate 02/10/24 1238 81     Resp 02/10/24 1238 18     Temp 02/10/24 1238 97.7 F (36.5 C)     Temp Source 02/10/24 1451 Oral     SpO2 02/10/24 1238 100 %     Weight 02/10/24 1238 190 lb 3.2 oz (86.3 kg)     Height 02/10/24 1238 5' 3 (1.6 m)     Head Circumference --      Peak Flow --      Pain Score 02/10/24 1238 7     Pain Loc --      Pain Education --      Exclude from Growth Chart --     Most recent vital signs: Vitals:   02/10/24 1238 02/10/24 1451  BP: 107/76 (!) 132/118  Pulse: 81 84  Resp: 18 (!) 24  Temp: 97.7 F (36.5 C) 98.1 F (36.7 C)  SpO2: 100% 100%   General: Awake, alert, oriented. CV:  Good peripheral perfusion. Regular rate and rhythm. Resp:  Normal effort. Lungs clear. Abd:  No distention. Tender to palpation in the right abdomen.  ED Results / Procedures / Treatments   Labs (all  labs ordered are listed, but only abnormal results are displayed) Labs Reviewed  COMPREHENSIVE METABOLIC PANEL WITH GFR - Abnormal; Notable for the following components:      Result Value   Sodium 132 (*)    CO2 17 (*)    Glucose, Bld 155 (*)    Creatinine, Ser 1.01 (*)    Calcium  8.2 (*)    Albumin 3.4 (*)    Alkaline Phosphatase 270 (*)    All other components within normal limits  CBC - Abnormal; Notable for the following components:   RBC 3.63 (*)    Hemoglobin 11.3 (*)    HCT 34.6 (*)    RDW 17.3 (*)    Platelets 1,077 (*)    nRBC 0.4 (*)    All other components within normal limits  URINALYSIS, ROUTINE W REFLEX MICROSCOPIC - Abnormal; Notable for the following components:   Color, Urine YELLOW (*)    APPearance CLEAR (*)    Bacteria, UA RARE (*)    All other components within normal  limits  LIPASE, BLOOD     EKG  None   RADIOLOGY I independently interpreted and visualized the CT abd/pel. My interpretation: No free air Radiology interpretation:  IMPRESSION:  1. Mild colitis involving the right, transverse and proximal descending colon,  likely infectious or inflammatory etiology.     PROCEDURES:  Critical Care performed: No    MEDICATIONS ORDERED IN ED: Medications - No data to display   IMPRESSION / MDM / ASSESSMENT AND PLAN / ED COURSE  I reviewed the triage vital signs and the nursing notes.                              Differential diagnosis includes, but is not limited to, appendicitis, gallbladder disease, colitis, gastroenteritis  Patient's presentation is most consistent with acute presentation with potential threat to life or bodily function.  Patient presented to the emergency department today because of concerns for abdominal pain, weakness and fever.  Patient with recent hospitalization secondary to colitis.  On exam patient is afebrile here.  She is tender in the right abdomen.  Blood work without leukocytosis however she has significant  thrombocytosis.  Will check CT scan to evaluate for persistent colitis, intra-abdominal abscess, appendicitis, other concerning intra-abdominal infection.  Patient CT scan shows mild colitis.  Patient did feel better after IV fluids.  Discussed thrombocytosis with Dr. Jacobo with hematology over the telephone.  At this time he does think it is likely reactive.  \Will plan on following up with patient in hematology clinic.  Discussed plan with patient.     FINAL CLINICAL IMPRESSION(S) / ED DIAGNOSES   Final diagnoses:  Colitis  Thrombocytosis      Note:  This document was prepared using Dragon voice recognition software and may include unintentional dictation errors.    Floy Roberts, MD 02/10/24 2038

## 2024-02-12 ENCOUNTER — Ambulatory Visit: Admitting: Family

## 2024-02-14 ENCOUNTER — Encounter: Payer: Self-pay | Admitting: Family

## 2024-02-15 ENCOUNTER — Other Ambulatory Visit: Payer: Self-pay

## 2024-02-18 ENCOUNTER — Other Ambulatory Visit: Payer: Self-pay | Admitting: Family

## 2024-02-18 MED ORDER — DEXCOM G6 SENSOR MISC: 1.0000 | 0 refills | Status: AC | PRN

## 2024-03-16 ENCOUNTER — Other Ambulatory Visit: Payer: Self-pay | Admitting: Family

## 2024-03-23 ENCOUNTER — Other Ambulatory Visit: Payer: Self-pay | Admitting: Family

## 2024-03-29 ENCOUNTER — Ambulatory Visit: Admitting: Cardiology

## 2024-04-13 ENCOUNTER — Other Ambulatory Visit: Payer: Self-pay | Admitting: Family

## 2024-04-26 ENCOUNTER — Ambulatory Visit: Admitting: Family
